# Patient Record
Sex: Male | Born: 1940 | Race: White | Hispanic: No | Marital: Married | Smoking: Never smoker
Health system: Southern US, Community
[De-identification: ages and names within clinical notes are randomized; demographics above are authoritative.]

## PROBLEM LIST (undated history)

## (undated) DIAGNOSIS — E119 Type 2 diabetes mellitus without complications: Secondary | ICD-10-CM

## (undated) DIAGNOSIS — I10 Essential (primary) hypertension: Secondary | ICD-10-CM

## (undated) DIAGNOSIS — I272 Pulmonary hypertension, unspecified: Secondary | ICD-10-CM

## (undated) DIAGNOSIS — I779 Disorder of arteries and arterioles, unspecified: Secondary | ICD-10-CM

## (undated) DIAGNOSIS — E785 Hyperlipidemia, unspecified: Secondary | ICD-10-CM

## (undated) DIAGNOSIS — I5030 Unspecified diastolic (congestive) heart failure: Secondary | ICD-10-CM

## (undated) DIAGNOSIS — R001 Bradycardia, unspecified: Secondary | ICD-10-CM

## (undated) DIAGNOSIS — I4891 Unspecified atrial fibrillation: Secondary | ICD-10-CM

## (undated) DIAGNOSIS — M1990 Unspecified osteoarthritis, unspecified site: Secondary | ICD-10-CM

## (undated) DIAGNOSIS — I499 Cardiac arrhythmia, unspecified: Secondary | ICD-10-CM

## (undated) HISTORY — DX: Type 2 diabetes mellitus without complications: E11.9

## (undated) HISTORY — DX: Essential (primary) hypertension: I10

## (undated) HISTORY — DX: Hyperlipidemia, unspecified: E78.5

## (undated) HISTORY — PX: TONSILLECTOMY: SUR1361

## (undated) HISTORY — DX: Disorder of arteries and arterioles, unspecified: I77.9

## (undated) HISTORY — PX: OTHER SURGICAL HISTORY: SHX169

## (undated) HISTORY — DX: Unspecified atrial fibrillation: I48.91

## (undated) HISTORY — DX: Pulmonary hypertension, unspecified: I27.20

## (undated) HISTORY — PX: COLONOSCOPY: SHX174

## (undated) HISTORY — PX: HERNIA REPAIR: SHX51

---

## 2004-04-10 ENCOUNTER — Ambulatory Visit (HOSPITAL_COMMUNITY): Admission: RE | Admit: 2004-04-10 | Discharge: 2004-04-10 | Payer: Self-pay | Admitting: Ophthalmology

## 2004-09-12 ENCOUNTER — Ambulatory Visit: Payer: Self-pay | Admitting: Cardiology

## 2004-10-10 ENCOUNTER — Ambulatory Visit: Payer: Self-pay | Admitting: Cardiology

## 2004-11-10 ENCOUNTER — Ambulatory Visit: Payer: Self-pay | Admitting: Cardiology

## 2004-11-17 ENCOUNTER — Ambulatory Visit: Payer: Self-pay | Admitting: Cardiology

## 2004-12-14 ENCOUNTER — Ambulatory Visit: Payer: Self-pay

## 2005-01-15 ENCOUNTER — Ambulatory Visit: Payer: Self-pay | Admitting: Cardiology

## 2005-01-23 ENCOUNTER — Ambulatory Visit: Payer: Self-pay | Admitting: Internal Medicine

## 2005-02-20 ENCOUNTER — Ambulatory Visit: Payer: Self-pay | Admitting: Cardiology

## 2005-03-20 ENCOUNTER — Ambulatory Visit: Payer: Self-pay | Admitting: Cardiology

## 2005-03-26 ENCOUNTER — Ambulatory Visit: Payer: Self-pay | Admitting: Cardiology

## 2005-04-09 ENCOUNTER — Ambulatory Visit: Payer: Self-pay | Admitting: Cardiology

## 2005-05-07 ENCOUNTER — Ambulatory Visit: Payer: Self-pay | Admitting: Cardiology

## 2005-06-04 ENCOUNTER — Ambulatory Visit: Payer: Self-pay | Admitting: Cardiology

## 2005-06-11 ENCOUNTER — Ambulatory Visit: Payer: Self-pay | Admitting: Cardiology

## 2005-07-09 ENCOUNTER — Ambulatory Visit: Payer: Self-pay | Admitting: Cardiology

## 2005-07-17 ENCOUNTER — Ambulatory Visit: Payer: Self-pay | Admitting: Cardiology

## 2005-07-24 ENCOUNTER — Ambulatory Visit: Payer: Self-pay | Admitting: Cardiology

## 2005-09-10 ENCOUNTER — Ambulatory Visit: Payer: Self-pay | Admitting: Cardiology

## 2005-10-10 ENCOUNTER — Ambulatory Visit: Payer: Self-pay | Admitting: Cardiology

## 2005-11-07 ENCOUNTER — Ambulatory Visit: Payer: Self-pay | Admitting: Cardiology

## 2005-12-05 ENCOUNTER — Ambulatory Visit: Payer: Self-pay | Admitting: Cardiology

## 2006-03-11 ENCOUNTER — Ambulatory Visit: Payer: Self-pay | Admitting: Cardiology

## 2007-01-30 ENCOUNTER — Ambulatory Visit: Payer: Self-pay | Admitting: Cardiology

## 2008-03-04 ENCOUNTER — Ambulatory Visit: Payer: Self-pay | Admitting: Cardiology

## 2008-03-08 ENCOUNTER — Encounter: Payer: Self-pay | Admitting: Cardiology

## 2008-09-03 ENCOUNTER — Encounter: Payer: Self-pay | Admitting: Cardiology

## 2008-11-25 ENCOUNTER — Encounter: Payer: Self-pay | Admitting: Cardiology

## 2009-01-19 ENCOUNTER — Encounter: Payer: Self-pay | Admitting: Cardiology

## 2009-03-02 ENCOUNTER — Encounter: Payer: Self-pay | Admitting: Cardiology

## 2009-04-11 ENCOUNTER — Ambulatory Visit: Payer: Self-pay | Admitting: Cardiology

## 2009-06-14 ENCOUNTER — Encounter: Payer: Self-pay | Admitting: Cardiology

## 2009-07-30 DIAGNOSIS — I4891 Unspecified atrial fibrillation: Secondary | ICD-10-CM | POA: Insufficient documentation

## 2009-11-22 ENCOUNTER — Encounter: Payer: Self-pay | Admitting: Cardiology

## 2010-04-06 ENCOUNTER — Ambulatory Visit: Payer: Self-pay | Admitting: Cardiology

## 2010-04-06 DIAGNOSIS — E119 Type 2 diabetes mellitus without complications: Secondary | ICD-10-CM

## 2010-04-06 DIAGNOSIS — E782 Mixed hyperlipidemia: Secondary | ICD-10-CM | POA: Insufficient documentation

## 2010-04-06 DIAGNOSIS — I10 Essential (primary) hypertension: Secondary | ICD-10-CM

## 2010-04-18 ENCOUNTER — Encounter: Payer: Self-pay | Admitting: Cardiology

## 2010-04-19 ENCOUNTER — Telehealth (INDEPENDENT_AMBULATORY_CARE_PROVIDER_SITE_OTHER): Payer: Self-pay | Admitting: *Deleted

## 2010-04-20 ENCOUNTER — Encounter: Payer: Self-pay | Admitting: Cardiology

## 2010-04-21 ENCOUNTER — Ambulatory Visit: Payer: Self-pay | Admitting: Cardiology

## 2010-04-27 ENCOUNTER — Encounter: Payer: Self-pay | Admitting: Cardiology

## 2010-05-09 ENCOUNTER — Encounter: Payer: Self-pay | Admitting: Cardiology

## 2010-05-11 ENCOUNTER — Ambulatory Visit (HOSPITAL_COMMUNITY): Admission: RE | Admit: 2010-05-11 | Discharge: 2010-05-11 | Payer: Self-pay | Admitting: Optometry

## 2010-11-21 NOTE — Assessment & Plan Note (Signed)
Summary: 1 year f/u LA  Medications Added METFORMIN HCL 500 MG TABS (METFORMIN HCL) take 1&1/2 by mouth inmorning and 1 in the evening NAPROSYN 500 MG TABS (NAPROXEN) Take 2 tablet by mouth once a day      Allergies Added: NKDA  Visit Type:  Follow-up Primary Provider:  Dr. Kirstie Peri   History of Present Illness: 70 year old male presents for followup. He is here with his wife. He denies any sense of palpitations or unusual breathlessness. He is not having any exertional chest pain.  He states that work has been very slow. He does have 2 young grandchildren, and has been trying to spend some time with them lately.  Labs from March 2010 showed SGOT 22, SGPT 24, LDL 72, cholesterol 122, HDL 41, triglycerides 44.  He states he recently had an insurance physical with full lab work and an ECG approximately 2 months ago. He also reports an echocardiogram with Dr. [name]. Will request these records for review.  He is due for followup chest x-ray and pulmonary function tests.  He is not having any bleeding problems on Coumadin.   Preventive Screening-Counseling & Management  Alcohol-Tobacco     Smoking Status: never  Current Medications (verified): 1)  Lipitor 40 Mg Tabs (Atorvastatin Calcium) .... Take 1 Tablet By Mouth Once A Day 2)  Amiodarone Hcl 200 Mg Tabs (Amiodarone Hcl) .... Take 1/2 Tablet By Mouth Once A Day 3)  Coumadin 5 Mg Tabs (Warfarin Sodium) .... Take 1 Tablet By Mouth Once A Day 4)  Aspir-Low 81 Mg Tbec (Aspirin) .... Take 1 Tablet By Mouth Once A Day 5)  Multivitamins  Tabs (Multiple Vitamin) .... Take 1 Tablet By Mouth Once A Day 6)  Vitamin C 500 Mg Tabs (Ascorbic Acid) .... Take 1 Tablet By Mouth Once A Day 7)  Niacin 250 Mg Tabs (Niacin) .... Take 1 Tablet By Mouth Once A Day 8)  Glipizide 10 Mg Tabs (Glipizide) .... Take 1 Tablet By Mouth Two Times A Day 9)  Chlorthalidone 25 Mg Tabs (Chlorthalidone) .... Take 1 Tablet By Mouth Once A Day 10)  Actos 45 Mg  Tabs (Pioglitazone Hcl) .... Take 1 Tablet By Mouth Once A Day 11)  Metformin Hcl 500 Mg Tabs (Metformin Hcl) .... Take 1&1/2 By Mouth Inmorning and 1 in The Evening 12)  Fish Oil 1000 Mg Caps (Omega-3 Fatty Acids) .... Take 1 Tablet By Mouth Two Times A Day 13)  Exforge 5-320 Mg Tabs (Amlodipine Besylate-Valsartan) .... Take 1 Tablet By Mouth Once A Day 14)  Naprosyn 500 Mg Tabs (Naproxen) .... Take 2 Tablet By Mouth Once A Day  Allergies (verified): No Known Drug Allergies  Comments:  Nurse/Medical Assistant: The patient's medications and allergies were verbally reviewed with the patient and were updated in the Medication and Allergy Lists.  Past History:  Social History: Last updated: 04/06/2010 Full Time Married  Tobacco Use - No  Past Medical History: Atrial Fibrillation - paroxysmal Diabetes Type 2 Hyperlipidemia Hypertension  Social History: Full Time Married  Tobacco Use - No  Review of Systems       The patient complains of peripheral edema.  The patient denies anorexia, fever, weight loss, chest pain, syncope, dyspnea on exertion, prolonged cough, headaches, melena, hematochezia, and severe indigestion/heartburn.         Arthritic knee pain, otherwise reviewed and negative except as outlined.  Vital Signs:  Patient profile:   70 year old male Height:  67 inches Weight:      264 pounds BMI:     41.50 Pulse rate:   65 / minute BP sitting:   100 / 63  (left arm) Cuff size:   large  Vitals Entered By: Carlye Grippe (April 06, 2010 12:58 PM)  Nutrition Counseling: Patient's BMI is greater than 25 and therefore counseled on weight management options.  Physical Exam  Additional Exam:  Morbidly obese male in no acute distress. HEENT: Conjunctiva and lids normal, oropharynx with moist mucosa. Neck: Increased girth, supple, no JVD or bruits. Lungs: Clear to auscultation, diminished, nonlabored. Cardiac: Somewhat distant, irregular heart sounds, no S3.  Indistinct PMI. Abdomen: Morbidly obese, cannot palpate liver edge, bowel sounds present, no tenderness. Skin: Warm and dry. Extremities: Trace edema below the knees, left greater than right, distal pulses one plus. Musculoskeletal: No gross deformities. Neuropsychiatric: Alert and oriented x3, affect appropriate.   PFT  Procedure date:  11/25/2008  Findings:      FVC 2.3 (59%), FEV1 1.9 (71%), FEV1/FVC 83, DLCO 12.6 (50%).  CXR  Procedure date:  03/08/2008  Findings:      Stable moderate enlargement of cardiac silhouette, no infiltrates, stable noncalcific pleural thickening lateral chest wall.  Carotid Doppler  Procedure date:  10/18/2005  Findings:      No hemodynamically significant stenosis.  EKG  Procedure date:  04/06/2010  Findings:      Atrial fibrillation at 62 beats per minute with incomplete right bundle branch block pattern and nonspecific ST-T changes.  Impression & Recommendations:  Problem # 1:  ATRIAL FIBRILLATION (ICD-427.31)  Paroxysmal to persistent atrial fibrillation. Patient is in atrial fibrillation today based on electrocardiogram, on stable medical regimen. He is not aware of any palpitations or unusual breathlessness however. Duration is uncertain. We discussed this, and at this point I will continue his present regimen including amiodarone, with plan for followup chest x-ray and pulmonary function tests. He will return for an electrocardiogram  in 2 weeks, and I will also try to obtain his most recent tracing from Dr. [name] with associated blood work. If it is more clear that he is in persistent atrial fibrillation, I will likely discontinue his amiodarone, perhaps adding a low-dose beta blocker for rate control, and continue a strategy of rate control and anticoagulation thereafter. Followup in 6 months.  His updated medication list for this problem includes:    Amiodarone Hcl 200 Mg Tabs (Amiodarone hcl) .Marland Kitchen... Take 1/2 tablet by mouth once a  day    Coumadin 5 Mg Tabs (Warfarin sodium) .Marland Kitchen... Take 1 tablet by mouth once a day    Aspir-low 81 Mg Tbec (Aspirin) .Marland Kitchen... Take 1 tablet by mouth once a day  Orders: T-Chest x-ray, 2 views (16109) Pulmonary Function Test (PFT)  Problem # 2:  ESSENTIAL HYPERTENSION, BENIGN (ICD-401.1)  Blood pressure well controlled today.  His updated medication list for this problem includes:    Aspir-low 81 Mg Tbec (Aspirin) .Marland Kitchen... Take 1 tablet by mouth once a day    Chlorthalidone 25 Mg Tabs (Chlorthalidone) .Marland Kitchen... Take 1 tablet by mouth once a day    Exforge 5-320 Mg Tabs (Amlodipine besylate-valsartan) .Marland Kitchen... Take 1 tablet by mouth once a day  Problem # 3:  MIXED HYPERLIPIDEMIA (ICD-272.2)  Will obtain recent labs from Dr. [name] for review.  His updated medication list for this problem includes:    Lipitor 40 Mg Tabs (Atorvastatin calcium) .Marland Kitchen... Take 1 tablet by mouth once a day    Niacin 250  Mg Tabs (Niacin) .Marland Kitchen... Take 1 tablet by mouth once a day  Problem # 4:  DIABETES MELLITUS, TYPE II (ICD-250.00)  Followed by Dr. [name].  His updated medication list for this problem includes:    Aspir-low 81 Mg Tbec (Aspirin) .Marland Kitchen... Take 1 tablet by mouth once a day    Glipizide 10 Mg Tabs (Glipizide) .Marland Kitchen... Take 1 tablet by mouth two times a day    Actos 45 Mg Tabs (Pioglitazone hcl) .Marland Kitchen... Take 1 tablet by mouth once a day    Metformin Hcl 500 Mg Tabs (Metformin hcl) .Marland Kitchen... Take 1&1/2 by mouth inmorning and 1 in the evening    Exforge 5-320 Mg Tabs (Amlodipine besylate-valsartan) .Marland Kitchen... Take 1 tablet by mouth once a day  Patient Instructions: 1)  Your physician wants you to follow-up in: 6 months. You will receive a reminder letter in the mail one-two months in advance. If you don't receive a letter, please call our office to schedule the follow-up appointment. 2)  Follow up EKG on Friday, April 21, 2010 at 8:30am. 3)  A chest x-ray takes a picture of the organs and structures inside the chest, including the  heart, lungs, and blood vessels. This test can show several things, including, whether the heart is enlarged; whether fluid is building up in the lungs; and whether pacemaker / defibrillator leads are still in place. 4)  Your physician has recommended that you have a pulmonary function test.  Pulmonary Function Tests are a group of tests that measure how well air moves in and out of your lungs.

## 2010-11-21 NOTE — Progress Notes (Signed)
Summary: Pending CXR   Phone Note Call from Patient Call back at Home Phone [phone]   Summary of Call: Left message to call back on machine regarding CXR that was ordered but has not been done yet. Initial call taken by: Cyril Loosen, RN, BSN,  April 19, 2010 8:54 AM  Follow-up for Phone Call        Pt's wife notified to have pt go to [facility] to have CXR done. Follow-up by: Cyril Loosen, RN, BSN,  April 19, 2010 9:20 AM

## 2010-11-21 NOTE — Assessment & Plan Note (Signed)
Summary: 2 WK F/U EKG-JM  Nurse Visit   Vital Signs:  Patient profile:   70 year old male Height:      67 inches Weight:      264 pounds  Vitals Entered By: Carlye Grippe (April 21, 2010 8:18 AM) CC: ekg   Visit Type:  nurse visit  CC:  [id number].   Preventive Screening-Counseling & Management  Alcohol-Tobacco     Smoking Status: never  Allergies: No Known Drug Allergies  Orders Added: 1)  EKG w/ Interpretation [93000]

## 2010-11-21 NOTE — Letter (Signed)
Summary: External Correspondence/ [facility]  External Correspondence/ [facility]   Imported By: Dorise Hiss 05/17/2010 14:07:30  _____________________________________________________________________  External Attachment:    Type:   Image     Comment:   External Document

## 2010-12-27 ENCOUNTER — Ambulatory Visit: Payer: Self-pay | Admitting: Cardiology

## 2011-01-03 ENCOUNTER — Encounter: Payer: Self-pay | Admitting: *Deleted

## 2011-01-30 ENCOUNTER — Encounter: Payer: Self-pay | Admitting: Cardiology

## 2011-01-30 ENCOUNTER — Ambulatory Visit (INDEPENDENT_AMBULATORY_CARE_PROVIDER_SITE_OTHER): Payer: Medicare PPO | Admitting: Cardiology

## 2011-01-30 VITALS — BP 99/64 | HR 48 | Ht 67.0 in | Wt 264.0 lb

## 2011-01-30 DIAGNOSIS — R0609 Other forms of dyspnea: Secondary | ICD-10-CM

## 2011-01-30 DIAGNOSIS — E119 Type 2 diabetes mellitus without complications: Secondary | ICD-10-CM

## 2011-01-30 DIAGNOSIS — I4891 Unspecified atrial fibrillation: Secondary | ICD-10-CM

## 2011-01-30 DIAGNOSIS — I10 Essential (primary) hypertension: Secondary | ICD-10-CM

## 2011-01-30 NOTE — Assessment & Plan Note (Signed)
Persistent. Amiodarone has already been discontinued. Plan at this point will be a strategy of heart rate control and anticoagulation. Continue routine followup.

## 2011-01-30 NOTE — Assessment & Plan Note (Signed)
Continue followup with Dr. Shah. 

## 2011-01-30 NOTE — Assessment & Plan Note (Signed)
Chronic, not progressive. Suspect this is multifactorial complicated by morbid obesity with no regular exercise, diabetes mellitus, and atrial fibrillation. I discussed weight loss, also a basic walking regimen. He will let us know of symptoms progress, and we could certainly consider followup ischemic testing.

## 2011-01-30 NOTE — Progress Notes (Signed)
Clinical Summary Robert Baldwin is a 70 y.o.male presenting for followup. He was last seen in June 2011. He had been on amiodarone with history of paroxysmal atrial fibrillation, although this was discontinued after our last visit given persistent dysrhythmia. He has tolerated atrial fibrillation fairly well with rate control, and continues on Coumadin.  Followup testing is outlined below, including a CT scan of the chest prompting referral to Dr. Orson Aloe for pulmonary evaluation. Ultimately a PET scan was obtained demonstrating no clear evidence of malignancy with probable mucoid impaction identified. Patient states he underwent a bronchoscopy with removal of the "plug."  He reports fatigue, stress with work, dyspnea on exertion although not progressive. Remains morbidly obese. We did discuss diet and weight loss again.   No Known Allergies  Current outpatient prescriptions:amLODipine-valsartan (EXFORGE) 5-320 MG per tablet, Take 1 tablet by mouth daily.  , Disp: , Rfl: ;  Ascorbic Acid (VITAMIN C) 500 MG tablet, Take 500 mg by mouth daily.  , Disp: , Rfl: ;  aspirin 81 MG tablet, Take 81 mg by mouth daily.  , Disp: , Rfl: ;  atorvastatin (LIPITOR) 40 MG tablet, Take 40 mg by mouth daily.  , Disp: , Rfl:  chlorthalidone (HYGROTON) 25 MG tablet, Take 25 mg by mouth daily.  , Disp: , Rfl: ;  fish oil-omega-3 fatty acids 1000 MG capsule, Take 2 g by mouth 2 (two) times daily.  , Disp: , Rfl: ;  glipiZIDE (GLUCOTROL) 10 MG tablet, Take 10 mg by mouth 2 (two) times daily before a meal.  , Disp: , Rfl: ;  glucosamine-chondroitin 500-400 MG tablet, Take 1 tablet by mouth daily.  , Disp: , Rfl:  metFORMIN (GLUCOPHAGE) 500 MG tablet, Take 500 mg by mouth 2 (two) times daily with a meal. Take 1 and 1/2 tabs am 1 tab pm , Disp: , Rfl: ;  metoprolol succinate (TOPROL-XL) 25 MG 24 hr tablet, Take 25 mg by mouth daily.  , Disp: , Rfl: ;  Multiple Vitamins-Minerals (MULTIVITAMIN WITH MINERALS) tablet, Take 1 tablet by  mouth daily.  , Disp: , Rfl: ;  niacin 250 MG tablet, Take 250 mg by mouth daily with breakfast.  , Disp: , Rfl:  pioglitazone (ACTOS) 45 MG tablet, Take 45 mg by mouth daily.  , Disp: , Rfl: ;  warfarin (COUMADIN) 5 MG tablet, Take 5 mg by mouth daily.  , Disp: , Rfl: ;  DISCONTD: naproxen (NAPROSYN) 500 MG tablet, Take 500 mg by mouth 2 (two) times daily with a meal.  , Disp: , Rfl:   Past Medical History  Diagnosis Date  . Atrial fibrillation     Paroxysmal  . Type 2 diabetes mellitus   . Hyperlipidemia   . Hypertension     Social History Mr. Poehler reports that he has never smoked. He has never used smokeless tobacco. Mr. Lord reports that he does not drink alcohol.  Review of Systems As outlined above, otherwise reviewed and negative.  Physical Examination Filed Vitals:   01/30/11 1031  BP: 99/64  Pulse: 48  Morbidly obese male in no acute distress. HEENT: Conjunctiva and lids normal, oropharynx with moist mucosa. Neck: Increased girth, supple, no JVD or bruits. Lungs: Clear to auscultation, diminished, nonlabored. Cardiac: Somewhat distant, irregular heart sounds, no S3. Indistinct PMI. Abdomen: Morbidly obese, cannot palpate liver edge, bowel sounds present, no tenderness. Skin: Warm and dry. Extremities: Trace edema below the knees, left greater than right, distal pulses one plus. Musculoskeletal: No gross deformities.  Neuropsychiatric: Alert and oriented x3, affect appropriate.   ECG Atrial fibrillation at 53 beats per minute, low voltage, poor anterior R wave progression.  Studies Pulmonary function tests from 04/18/2010 revealed FEV1 2.8, 73% predicted; FVC 3.5, 76% predicted; FEV1/FVC 76% predicted; DLCO 59% predicted.  Chest x-ray from 04/20/2010 revealed stable cardiac enlargement, with indeterminate nodule in the left lung field measuring 10 mm.  Chest CT from 04/27/2010 revealed possible mucoid impaction involving the superior segment of the left lower lobe  corresponding to abnormality on chest x-ray.  Problem List and Plan

## 2011-01-30 NOTE — Assessment & Plan Note (Signed)
Blood pressure very well controlled. 

## 2011-01-30 NOTE — Patient Instructions (Signed)
Your physician wants you to follow-up in: 6 months. You will receive a reminder letter in the mail one-two months in advance. If you don't receive a letter, please call our office to schedule the follow-up appointment. Your physician recommends that you continue on your current medications as directed. Please refer to the Current Medication list given to you today. 

## 2011-02-02 ENCOUNTER — Encounter: Payer: Self-pay | Admitting: Cardiology

## 2011-03-06 NOTE — Assessment & Plan Note (Signed)
[facility]                          [facility] OFFICE NOTE   Robert Baldwin, Robert Baldwin                          MRN:          [id number]  DATE:03/04/2008                            DOB:          Apr 26, 1941    PRIMARY CARE PHYSICIAN:  Kirstie Peri, MD   REASON FOR VISIT:  Follow up atrial fibrillation.   HISTORY OF PRESENT ILLNESS:  Robert Baldwin comes in for an annual visit.  He  has a history of paroxysmal atrial fibrillation that is been very well-  controlled on amiodarone and Coumadin.  He has in the past had his  surveillance labs done by Dr. Eliberto Ivory although has not had any recent  liver function tests or thyroid function studies based on his report.  His last chest x-ray was in April 2008, revealing no acute  cardiopulmonary findings.  He had pulmonary function tests that  demonstrated a reduction in diffusion capacity and based on this we  decreased his amiodarone to 100 mg daily.  His Coumadin has been  maintained through [facility].  He denies having any  significant problems with chest pain, progressive breathlessness, major  bleeding problems, vision changes or significant palpitations.  His  electrocardiogram today shows sinus rhythm with a prolonged PR interval  of 270 msec, which is stable, and a right bundle branch block pattern,  which has been seen in the past although not on the most recent tracing.  He had follow-up pulmonary function studies done in November 2008, which  revealed moderate reduction in the DLCO (described as moderate to severe  decrease previously).   ALLERGIES:  No known drug allergies.   MEDICATIONS:  1. Amiodarone 100 mg p.o. daily  2. Coumadin as directed by Dr. [name] to achieve a therapeutic INR of      2.0-3.0.  3. Diovan 320 mg p.o. daily.  4. Aspirin 81 mg p.o. daily.  5. Multivitamin one p.o. daily.  6. Vitamin C 500 mg p.o. daily.  7. Niacin 200 mg p.o. daily.  8. Glipizide 10 mg p.o. b.i.d.  9. Chlorthalidone 25 mg p.o. daily.  10.Lipitor 10 mg p.o. daily.  11.Actos 45 mg p.o. daily.  12.Metformin 1500 mg p.o. q.a.m. and 1000 mg p.o. q.p.m.   REVIEW OF SYSTEMS:  As outlined above.  He states he had a recent tick  bite and took a course of doxycycline.  Otherwise he has had no major  problems.   EXAMINATION:  Oxygen saturation 96% on room air.  Blood pressure 108/64,  heart rate 73 and regular rate.  Weight is 253 pounds.  This is an overweight male in no acute distress.  HEENT:  Conjunctiva and lids normal.  Pharynx clear.  NECK:  Supple.  No elevated jugular venous pressure, no loud bruits.  LUNGS:  Clear with diminished breath sounds throughout.  CARDIAC:  A regular rate and rhythm.  No loud murmur or S3 gallop.  ABDOMEN:  Obese, normoactive bowel sounds.  No tenderness to palpation.  EXTREMITIES:  No frank pitting edema.  There is some venous stasis.  Distal pulses  are 2+.  SKIN:  Warm and dry.  MUSCULOSKELETAL:  No kyphosis noted.  NEUROPSYCHIATRIC: The patient is alert and oriented x3.  Affect is  appropriate.   IMPRESSION AND RECOMMENDATIONS:  History of atrial fibrillation, well-  controlled on low-dose amiodarone and Coumadin.  We will plan a follow-  up chest x-ray, pulmonary function tests, liver function tests and  thyroid function studies.  He will continue see Dr. [name] for follow-up  of Coumadin and we will see him on an annual basis as before unless he  develops any intercurrent problems.     Robert Sidle, MD  Electronically Signed    SGM/MedQ  DD: 03/04/2008  DT: 03/04/2008  Job #: [id number]   cc:   Kirstie Peri, MD

## 2011-03-06 NOTE — Assessment & Plan Note (Signed)
[facility]                          [facility] OFFICE NOTE   [name], Baldwin                          MRN:          [id number]  DATE:04/11/2009                            DOB:          1941/04/08    PRIMARY CARE PHYSICIAN:  Kirstie Peri, MD   REASON FOR VISIT:  Followup atrial fibrillation.   HISTORY OF PRESENT ILLNESS:  [name] comes in for an annual visit.  Overall, he is symptomatically stable without any progressive  breathlessness or palpitations.  His electrocardiogram today shows sinus  rhythm at 68 beats per minute with a prolonged period interval of 246  msec and chronic right bundle-branch block pattern.  Pulmonary function  tests done in February showed no obstructive defect with a stable  moderate restrictive lung defect and moderately decreased diffusing  capacity which is actually improved compared to his previous studies.  Recent lipid profile shows good LDL control of 65 on Lipitor with normal  AST and ALT.  He has had some arthritic right knee and hip pain  following a fall and this has limited his ambulation somewhat.  He is up  4 pounds from his last visit.  We talked about weight loss and mild-to-  moderate exercise today.   ALLERGIES:  No known drug allergies.   PRESENT MEDICATIONS:  1. Amiodarone 100 mg p.o. daily.  2. Coumadin as directed by Dr. [name] to achieve a therapeutic of INR of      2.0-3.0  3. Aspirin 81 mg p.o. daily.  4. Multivitamin 1 p.o. daily.  5. Vitamin C 500 mg p.o. daily.  6. Niacin 250 mg p.o. daily.  7. Glipizide 10 mg p.o. b.i.d.  8. Chlorthalidone 25 mg p.o. daily.  9. Actos 45 mg p.o. daily.  10.Metformin 1500 mg p.o. q.a.m. and 1000 mg p.o. q.p.m.  11.Lipitor 40 mg p.o. daily.  12.Omega-3 supplements 1000 mg p.o. b.i.d.  13.Exforge 5/320 mg p.o. daily.   REVIEW OF SYSTEMS:  As outlined above.  Otherwise, negative.   PHYSICAL EXAMINATION:  VITAL SIGNS:  Blood pressure today 106/67,  heart  rate 75 and regular, weight 257 pounds.  GENERAL:  This is a morbidly obese male in no acute distress.  NECK:  No elevated jugular venous pressure.  No significant carotid  bruits.  LUNGS:  Clear without labored breathing.  CARDIAC:  Regular rate and rhythm without S3 gallop.  EXTREMITIES:  Exhibit trace edema and venous stasis.  Distal pulses are  2+.   IMPRESSION AND RECOMMENDATIONS:  History of paroxysmal atrial  fibrillation, well controlled on low-dose amiodarone and Coumadin.  Chest x-ray at last visit was stable and pulmonary function tests from  earlier in the year are also relatively stable with improved diffusion  capacity in general.  We will plan to continue his present regimen and I  will see him back for annual visit preceded by pulmonary function test,  a TSH, and liver function tests.  His electrocardiogram is stable today.     Robert Sidle, MD  Electronically Signed    SGM/MedQ  DD: 04/11/2009  DT: 04/12/2009  Job #: [id number]   cc:   Kirstie Peri, MD

## 2011-03-09 NOTE — Assessment & Plan Note (Signed)
[facility]                          [facility] OFFICE NOTE   Robert Baldwin, Robert Baldwin                          MRN:          [id number]  DATE:01/30/2007                            DOB:          06/05/1941    FOLLOWUP VISIT   PRIMARY CARE PHYSICIAN:  Dr. Weyman Pedro.   REASON FOR VISIT:  Followup atrial fibrillation.   HISTORY OF PRESENT ILLNESS:  I last saw Robert Baldwin back in November of  2006.  He saw Roxanne Mins, PA-C, in the interim.  Symptomatically, he  is not reporting any problems with significant palpitations, chest pain,  or limiting dyspnea on exertion.  He has continued on amiodarone and  Coumadin, and his electrocardiogram today shows sinus rhythm, actually  with improvement in both his QT interval and PR interval compared to his  previous tracing.  He has had no problems with balance or vision.  He  states that Dr. Eliberto Ivory planned a full set of blood work later this  month, and I note that he is due for a followup chest x-ray as well as  pulmonary function test.   ALLERGIES:  No known drug allergies.   PRESENT MEDICATIONS:  1. Glucotrol XL 10 mg p.o. daily.  2. Chlorthalidone 25 mg p.o. daily.  3. Multivitamin daily.  4. Coumadin as directed by Dr. Eliberto Ivory.  5. Diovan 80 mg p.o. daily.  6. Amiodarone 200 mg p.o. daily.  7. Glucophage 1000 mg p.o. q. a.m. and 1500 mg p.o. q. p.m.   REVIEW OF SYSTEMS:  As described in the history of present illness.  Otherwise, negative.   EXAMINATION:  Blood pressure is 143/78, heart rate is 75, weight is 256  pounds.  He is an overweight male in no acute distress.  Examination of the neck reveals no elevated jugular venous pressure or  loud bruits.  No thyromegaly or thyroid tenderness is noted.  LUNGS:  Generally clear.  Somewhat diminished breath sounds.  No  wheezing or labored breathing.  CARDIAC:  Regular rate and rhythm.  No S3 gallop or pericardial rub.  ABDOMEN:  Obese, nontender.  EXTREMITIES:  No significant pitting edema.  He has healing ecchymosis  on his right leg following an injury.   IMPRESSION/RECOMMENDATIONS:  1. Paroxysmal atrial fibrillation, well controlled on amiodarone and      Coumadin.  Electrocardiogram today shows improved QT and PR      intervals compared with the previous tracing.  He is due for      followup blood work with Dr. Eliberto Ivory, which from the perspective of      amiodarone, should include thyroid and liver function studies.  We      will order a chest x-ray and pulmonary function tests as well for      surveillance.  At this point, I do not anticipate any medication      adjustments, and we will plan to see him back next year assuming      the situation remains stable.  2. Robert Baldwin asked about carotid artery disease today.  He had a  carotid duplex scan in December of 2006, which revealed no      hemodynamically significant stenoses.  I would anticipate a      followup study perhaps in a year's time.  3. Otherwise, continued followup of hypertension, hyperlipidemia, and      Type 2 diabetes mellitus with Dr. Eliberto Ivory.     Jonelle Sidle, MD  Electronically Signed    SGM/MedQ  DD: 01/30/2007  DT: 01/30/2007  Job #: [id number]   cc:   Weyman Pedro

## 2011-03-12 ENCOUNTER — Other Ambulatory Visit: Payer: Self-pay | Admitting: Cardiology

## 2011-03-12 NOTE — Telephone Encounter (Signed)
Robert Baldwin

## 2011-03-13 ENCOUNTER — Other Ambulatory Visit: Payer: Self-pay | Admitting: *Deleted

## 2011-03-13 MED ORDER — METOPROLOL TARTRATE 25 MG PO TABS: 25.0000 mg | ORAL_TABLET | Freq: Every day | ORAL | Status: DC

## 2011-03-14 NOTE — Telephone Encounter (Signed)
rx responded to already

## 2011-09-03 ENCOUNTER — Encounter: Payer: Self-pay | Admitting: Cardiology

## 2011-09-05 ENCOUNTER — Telehealth: Payer: Self-pay | Admitting: *Deleted

## 2011-09-05 ENCOUNTER — Ambulatory Visit (INDEPENDENT_AMBULATORY_CARE_PROVIDER_SITE_OTHER): Payer: Medicare PPO | Admitting: Cardiology

## 2011-09-05 ENCOUNTER — Encounter: Payer: Self-pay | Admitting: Cardiology

## 2011-09-05 VITALS — BP 114/68 | HR 56 | Ht 66.0 in | Wt 266.0 lb

## 2011-09-05 DIAGNOSIS — I10 Essential (primary) hypertension: Secondary | ICD-10-CM

## 2011-09-05 DIAGNOSIS — R0989 Other specified symptoms and signs involving the circulatory and respiratory systems: Secondary | ICD-10-CM

## 2011-09-05 DIAGNOSIS — I4891 Unspecified atrial fibrillation: Secondary | ICD-10-CM

## 2011-09-05 NOTE — Assessment & Plan Note (Signed)
Continue strategy of heart rate control and anticoagulation. Coumadin is followed by Dr. [name]. Would like to discontinue Lopressor given his bradycardia.

## 2011-09-05 NOTE — Telephone Encounter (Signed)
Message copied by Arlyss Gandy on Wed Sep 05, 2011  4:10 PM ------      Message from: MCDOWELL, Illene Bolus      Created: Wed Sep 05, 2011  9:12 AM       Reviewed report. LVEF described as normal with mild LVH. Moderate left and right atrial enlargement, normal RV function. Indicates at least moderate pulmonary hypertension with RVSP of 65 mm mercury. This was not described on prior echocardiogram report in 2010. Previous PFTs suggest restrictive lung defect (likely related to weight), but no definite obstructive lung disease such as COPD or asthma. This could be related to diastolic dysfunction with history of hypertension, or perhaps obstructive sleep apnea. Would recommend a followup 2-D echocardiogram through our office for his next followup.

## 2011-09-05 NOTE — Patient Instructions (Signed)
Your physician wants you to follow-up in: 6 months. You will receive a reminder letter in the mail one-two months in advance. If you don't receive a letter, please call our office to schedule the follow-up appointment. Stop Lopressor (metoprolol)

## 2011-09-05 NOTE — Assessment & Plan Note (Signed)
Blood pressure very well controlled today. 

## 2011-09-05 NOTE — Telephone Encounter (Signed)
Pt's wife notified of results and verbalized understanding.

## 2011-09-05 NOTE — Assessment & Plan Note (Signed)
Chronic history, no significant change. Continue to recommend diet with weight loss, also exercise. Will request the recent echocardiogram results from Dr. [name].

## 2011-09-05 NOTE — Progress Notes (Signed)
Clinical Summary Robert Baldwin is a 70 y.o.male presenting for followup. He was seen in April.  He reports no palpitations, stable dyspnea on exertion, no cough or wheezing. Indicates that he had a recent echocardiogram done at [facility] internal medicine and was told that he had "high pressures" in his "lungs." I do not have the report for review.  He remains morbidly obese, has not been exercising regularly. We discussed diet and exercise today. Also note that his heart rate is fairly slow on low-dose Lopressor, which he is only taking once a day.  Other medications are stable. He denies any bleeding problems on Coumadin. This is followed by Dr. [name].  No Known Allergies  Medication list reviewed.  Past Medical History  Diagnosis Date  . Atrial fibrillation     Paroxysmal  . Type 2 diabetes mellitus   . Hyperlipidemia   . Hypertension     Past Surgical History  Procedure Date  . Hernia repair   . Cataract surgery    Social History Robert Baldwin reports that he has never smoked. He has never used smokeless tobacco. Robert Baldwin reports that he does not drink alcohol.  Review of Systems Complains of arthritic pain in his hips and knees. Otherwise reviewed and negative except as outlined.  Physical Examination Filed Vitals:   09/05/11 0821  BP: 114/68  Pulse: 56    Morbidly obese male in no acute distress.  HEENT: Conjunctiva and lids normal, oropharynx with moist mucosa.  Neck: Increased girth, supple, no JVD or bruits.  Lungs: Clear to auscultation, diminished, nonlabored.  Cardiac: Distant, irregular heart sounds, no S3. Indistinct PMI.  Abdomen: Morbidly obese, bowel sounds present, no tenderness.  Skin: Warm and dry.  Extremities: Trace edema below the knees, distal pulses one plus.  Musculoskeletal: No gross deformities.  Neuropsychiatric: Alert and oriented x3, affect appropriate.   ECG Reviewed in EMR.   Problem List and Plan

## 2012-02-27 ENCOUNTER — Other Ambulatory Visit: Payer: Self-pay | Admitting: *Deleted

## 2012-02-27 ENCOUNTER — Other Ambulatory Visit: Payer: Self-pay

## 2012-02-27 ENCOUNTER — Other Ambulatory Visit (INDEPENDENT_AMBULATORY_CARE_PROVIDER_SITE_OTHER): Payer: Medicare PPO

## 2012-02-27 DIAGNOSIS — I4891 Unspecified atrial fibrillation: Secondary | ICD-10-CM

## 2012-02-27 DIAGNOSIS — R0609 Other forms of dyspnea: Secondary | ICD-10-CM

## 2012-03-07 ENCOUNTER — Ambulatory Visit (INDEPENDENT_AMBULATORY_CARE_PROVIDER_SITE_OTHER): Payer: Medicare PPO | Admitting: Cardiology

## 2012-03-07 ENCOUNTER — Encounter: Payer: Self-pay | Admitting: Cardiology

## 2012-03-07 VITALS — BP 106/64 | HR 59 | Ht 66.0 in | Wt 273.0 lb

## 2012-03-07 DIAGNOSIS — I4891 Unspecified atrial fibrillation: Secondary | ICD-10-CM

## 2012-03-07 DIAGNOSIS — I2789 Other specified pulmonary heart diseases: Secondary | ICD-10-CM

## 2012-03-07 DIAGNOSIS — I6523 Occlusion and stenosis of bilateral carotid arteries: Secondary | ICD-10-CM | POA: Insufficient documentation

## 2012-03-07 DIAGNOSIS — IMO0002 Reserved for concepts with insufficient information to code with codable children: Secondary | ICD-10-CM

## 2012-03-07 DIAGNOSIS — R0989 Other specified symptoms and signs involving the circulatory and respiratory systems: Secondary | ICD-10-CM

## 2012-03-07 DIAGNOSIS — I10 Essential (primary) hypertension: Secondary | ICD-10-CM

## 2012-03-07 NOTE — Assessment & Plan Note (Signed)
Mild by most recent echocardiogram with mild RV dysfunction. Suspect that he has underlying obstructive sleep apnea, also restrictive lung disease with obesity. He has not been interested in pursuing a sleep study.

## 2012-03-07 NOTE — Patient Instructions (Signed)
Your physician recommends that you schedule a follow-up appointment in:6 months. You will receive a reminder letter in the mail in about 4 months reminding you to call and schedule your appointment. If you don't receive this letter, please contact our office.  Your physician has requested that you have a carotid duplex. This test is an ultrasound of the carotid arteries in your neck. It looks at blood flow through these arteries that supply the brain with blood. Allow one hour for this exam. There are no restrictions or special instructions.    Your physician recommends that you continue on your current medications as directed. Please refer to the Current Medication list given to you today.

## 2012-03-07 NOTE — Assessment & Plan Note (Signed)
Question soft right carotid bruit. Carotid Dopplers will be obtained.

## 2012-03-07 NOTE — Assessment & Plan Note (Signed)
Low-normal blood pressure today, asymptomatic. Continue observation.

## 2012-03-07 NOTE — Progress Notes (Signed)
Clinical Summary Robert Baldwin is a 71 y.o.male presenting for followup. He was seen in November 2012. He is here with his wife and grandchild. He reports no unusual dizziness, no palpitations or chest pain. Low-normal blood pressure is noted today. He has not required any medications for heart rate control, continues on Coumadin without bleeding problems.  Previous echo report reviewed from [facility] internal medicine. Report describes mild LVH with normal LVEF, moderate biatrial enlargement, normal RV function, RVSP 65 mmHg. Followup study done just recently shows LVEF of 60% with moderate LVH, mildly dilated right ventricle with mildly reduced function, only mildly increased pulmonary pressure. We discussed these results. I suspect he may well have obstructive sleep apnea. He has not wanted to pursue any formal testing for this however.   No Known Allergies  Current Outpatient Prescriptions  Medication Sig Dispense Refill  . amLODipine-valsartan (EXFORGE) 5-320 MG per tablet Take 1 tablet by mouth daily.        . Ascorbic Acid (VITAMIN C) 500 MG tablet Take 500 mg by mouth daily.        Marland Kitchen aspirin 81 MG tablet Take 81 mg by mouth daily.        Marland Kitchen atorvastatin (LIPITOR) 40 MG tablet Take 40 mg by mouth daily.        . chlorthalidone (HYGROTON) 25 MG tablet Take 25 mg by mouth daily.        . fish oil-omega-3 fatty acids 1000 MG capsule Take 2 g by mouth 2 (two) times daily.        Marland Kitchen glipiZIDE (GLUCOTROL) 10 MG tablet Take 10 mg by mouth 2 (two) times daily before a meal.        . glucosamine-chondroitin 500-400 MG tablet Take 1 tablet by mouth daily.        . metFORMIN (GLUCOPHAGE) 1000 MG tablet Take 1.5 tablets by mouth every morning. & 1 in evening      . Multiple Vitamins-Minerals (MULTIVITAMIN WITH MINERALS) tablet Take 1 tablet by mouth daily.        . niacin 250 MG tablet Take 250 mg by mouth daily with breakfast.        . pioglitazone (ACTOS) 45 MG tablet Take 1 tablet by mouth Daily.      Marland Kitchen  warfarin (COUMADIN) 5 MG tablet Take 5 mg by mouth daily. Managed by Dr. Margaretmary Eddy office        Social History Mr. Titsworth reports that he has never smoked. He has never used smokeless tobacco. Mr. Naugle reports that he does not drink alcohol.  Review of Systems No bleeding problems. Occasional arthritic pain. Stress from work. Stable appetite. No regular exercise. Otherwise negative except as outlined.  Physical Examination Filed Vitals:   03/07/12 1325  BP: 106/64  Pulse: 59   Morbidly obese male in no acute distress.  HEENT: Conjunctiva and lids normal, oropharynx with moist mucosa.  Neck: Increased girth, supple, no JVD, question soft right carotid bruit.  Lungs: Clear to auscultation, diminished, nonlabored.  Cardiac: Distant, irregular heart sounds, no S3. Indistinct PMI.  Abdomen: Morbidly obese, bowel sounds present, no tenderness.  Skin: Warm and dry.  Extremities: Trace edema below the knees, distal pulses one plus.  Musculoskeletal: No gross deformities.  Neuropsychiatric: Alert and oriented x3, affect appropriate.   ECG Reviewed in EMR.  Problem List and Plan   ATRIAL FIBRILLATION Continue strategy of heart rate control and anticoagulation. He is not on any specific medications to lower heart rate at  this point. No changes made today.  Carotid bruit Question soft right carotid bruit. Carotid Dopplers will be obtained.  ESSENTIAL HYPERTENSION, BENIGN Low-normal blood pressure today, asymptomatic. Continue observation.  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 Suspect that he has underlying obstructive sleep apnea, also restrictive lung disease with obesity. He has not been interested in pursuing a sleep study.     Jonelle Sidle, M.D., F.A.C.C.

## 2012-03-07 NOTE — Assessment & Plan Note (Signed)
Continue strategy of heart rate control and anticoagulation. He is not on any specific medications to lower heart rate at this point. No changes made today.

## 2012-04-03 ENCOUNTER — Encounter (INDEPENDENT_AMBULATORY_CARE_PROVIDER_SITE_OTHER): Payer: Medicare PPO

## 2012-04-03 DIAGNOSIS — I6529 Occlusion and stenosis of unspecified carotid artery: Secondary | ICD-10-CM

## 2012-04-03 DIAGNOSIS — R0989 Other specified symptoms and signs involving the circulatory and respiratory systems: Secondary | ICD-10-CM

## 2012-04-09 ENCOUNTER — Telehealth: Payer: Self-pay | Admitting: *Deleted

## 2012-04-09 NOTE — Telephone Encounter (Signed)
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 Continue medical therapy, followup study in one year.

## 2012-04-09 NOTE — Telephone Encounter (Signed)
Patient informed. 

## 2013-03-18 ENCOUNTER — Other Ambulatory Visit (HOSPITAL_COMMUNITY): Payer: Self-pay | Admitting: Psychiatry

## 2013-03-19 ENCOUNTER — Other Ambulatory Visit: Payer: Self-pay | Admitting: *Deleted

## 2013-03-19 DIAGNOSIS — R0989 Other specified symptoms and signs involving the circulatory and respiratory systems: Secondary | ICD-10-CM

## 2013-04-09 ENCOUNTER — Encounter (INDEPENDENT_AMBULATORY_CARE_PROVIDER_SITE_OTHER): Payer: Medicare PPO

## 2013-04-09 DIAGNOSIS — I6529 Occlusion and stenosis of unspecified carotid artery: Secondary | ICD-10-CM

## 2013-04-09 DIAGNOSIS — R0989 Other specified symptoms and signs involving the circulatory and respiratory systems: Secondary | ICD-10-CM

## 2013-04-14 ENCOUNTER — Telehealth: Payer: Self-pay | Admitting: *Deleted

## 2013-04-14 NOTE — Telephone Encounter (Signed)
Message copied by Eustace Moore on Tue Apr 14, 2013 11:55 AM ------      Message from: Jonelle Sidle      Created: Sun Apr 12, 2013  8:46 PM       Reviewed report.  Stable carotid atherosclerosis  Continue medical therapy. ------

## 2013-04-14 NOTE — Telephone Encounter (Signed)
Patient's wife informed

## 2013-04-22 ENCOUNTER — Encounter: Payer: Self-pay | Admitting: Cardiology

## 2013-04-22 ENCOUNTER — Ambulatory Visit (INDEPENDENT_AMBULATORY_CARE_PROVIDER_SITE_OTHER): Payer: Medicare PPO | Admitting: Cardiology

## 2013-04-22 VITALS — BP 150/65 | HR 49 | Ht 67.0 in | Wt 272.1 lb

## 2013-04-22 DIAGNOSIS — I6523 Occlusion and stenosis of bilateral carotid arteries: Secondary | ICD-10-CM

## 2013-04-22 DIAGNOSIS — I6529 Occlusion and stenosis of unspecified carotid artery: Secondary | ICD-10-CM

## 2013-04-22 DIAGNOSIS — I10 Essential (primary) hypertension: Secondary | ICD-10-CM

## 2013-04-22 DIAGNOSIS — I4891 Unspecified atrial fibrillation: Secondary | ICD-10-CM

## 2013-04-22 DIAGNOSIS — I658 Occlusion and stenosis of other precerebral arteries: Secondary | ICD-10-CM

## 2013-04-22 NOTE — Patient Instructions (Signed)
Continue all current medications. Your physician wants you to follow up in:  1 year.  You will receive a reminder letter in the mail one-two months in advance.  If you don't receive a letter, please call our office to schedule the follow up appointment   

## 2013-04-22 NOTE — Assessment & Plan Note (Signed)
Stable 40-59% bilateral ICA stenoses. Continues on aspirin and statin.

## 2013-04-22 NOTE — Progress Notes (Signed)
Clinical Summary Robert Baldwin is a 72 y.o.male last seen in May 2013. He continues to see Dr. [name] on a regular basis, reports no problems with palpitations, dizziness, or chest pain. Coumadin is followed by Dr. [name].  ECG today shows atrial fibrillation with nonspecific ST changes, poor R wave progression and low voltage. He is bradycardic, although not clearly symptomatic. He is not requiring any rate lowering medications, likely has some degree of underlying conduction system disease.  Recent carotid Dopplers 6/19 showed 40-59% bilateral ICA stenoses, overall stable. He is on aspirin and statin therapy.  Continues to work full time in [employer], states that business has been somewhat better.  No Known Allergies  Current Outpatient Prescriptions  Medication Sig Dispense Refill  . amLODipine-valsartan (EXFORGE) 5-320 MG per tablet Take 1 tablet by mouth daily.        . Ascorbic Acid (VITAMIN C) 500 MG tablet Take 500 mg by mouth daily.        Marland Kitchen aspirin 81 MG tablet Take 81 mg by mouth daily.        Marland Kitchen atorvastatin (LIPITOR) 40 MG tablet Take 40 mg by mouth daily.        . chlorthalidone (HYGROTON) 25 MG tablet Take 25 mg by mouth daily.        . fish oil-omega-3 fatty acids 1000 MG capsule Take 1 g by mouth 2 (two) times daily.       Marland Kitchen glipiZIDE (GLUCOTROL) 10 MG tablet Take 10 mg by mouth daily.       Marland Kitchen glucosamine-chondroitin 500-400 MG tablet Take 3 tablets by mouth daily. Take 2 tablets in AM and 1 table in PM      . metFORMIN (GLUCOPHAGE) 1000 MG tablet Take 1.5 tablets by mouth every morning. & 1 in evening      . Multiple Vitamins-Minerals (MULTIVITAMIN WITH MINERALS) tablet Take 1 tablet by mouth daily.        . niacin 250 MG tablet Take 250 mg by mouth daily with breakfast.        . pioglitazone (ACTOS) 45 MG tablet Take 1 tablet by mouth Daily.      Marland Kitchen warfarin (COUMADIN) 5 MG tablet Take 5 mg by mouth daily. Managed by Dr. Margaretmary Eddy office       No current facility-administered  medications for this visit.    Past Medical History  Diagnosis Date  . Atrial fibrillation     Paroxysmal  . Type 2 diabetes mellitus   . Hyperlipidemia   . Hypertension     Social History [name] reports that he has never smoked. He has never used smokeless tobacco. [name] reports that he does not drink alcohol.  Review of Systems Negative except as outlined.  Physical Examination Filed Vitals:   04/22/13 0821  BP: 150/65  Pulse: 49   Filed Weights   04/22/13 0821  Weight: 272 lb 1.9 oz (123.433 kg)    Morbidly obese male, comfortable at rest.  HEENT: Conjunctiva and lids normal, oropharynx with moist mucosa.  Neck: Increased girth, supple, no JVD, question soft right carotid bruit.  Lungs: Clear to auscultation, diminished, nonlabored.  Cardiac: Distant, irregular heart sounds, no S3. Indistinct PMI.  Abdomen: Morbidly obese, bowel sounds present, no tenderness.  Skin: Warm and dry.  Extremities: Trace edema below the knees, distal pulses one plus.  Musculoskeletal: No gross deformities.  Neuropsychiatric: Alert and oriented x3, affect appropriate.   Problem List and Plan   Atrial fibrillation Pernanent, continue  Coumadin under the direction of Dr. [name]. He is not requiring any rate lowering agents at this time, likely does have some degree of underlying conduction system disease which we will continue to follow.  Bilateral carotid artery occlusion Stable 40-59% bilateral ICA stenoses. Continues on aspirin and statin.  Essential hypertension, benign Blood pressure is elevated today, he states that his systolic is usually in the 130s. I asked him to keep an eye on this with Dr. [name].    Jonelle Sidle, M.D., F.A.C.C.

## 2013-04-22 NOTE — Assessment & Plan Note (Signed)
Pernanent, continue Coumadin under the direction of Dr. [name]. He is not requiring any rate lowering agents at this time, likely does have some degree of underlying conduction system disease which we will continue to follow.

## 2013-04-22 NOTE — Assessment & Plan Note (Signed)
Blood pressure is elevated today, he states that his systolic is usually in the 130s. I asked him to keep an eye on this with Dr. [name].

## 2014-05-07 ENCOUNTER — Encounter: Payer: Self-pay | Admitting: Cardiology

## 2014-05-07 ENCOUNTER — Ambulatory Visit (INDEPENDENT_AMBULATORY_CARE_PROVIDER_SITE_OTHER): Payer: Medicare PPO | Admitting: Cardiology

## 2014-05-07 VITALS — BP 117/50 | HR 45 | Ht 67.0 in | Wt 274.8 lb

## 2014-05-07 DIAGNOSIS — I10 Essential (primary) hypertension: Secondary | ICD-10-CM

## 2014-05-07 DIAGNOSIS — E782 Mixed hyperlipidemia: Secondary | ICD-10-CM

## 2014-05-07 DIAGNOSIS — I4891 Unspecified atrial fibrillation: Secondary | ICD-10-CM

## 2014-05-07 NOTE — Assessment & Plan Note (Signed)
Continue strategy of heart rate control and anticoagulation. Coumadin is followed by Dr. Sherryll BurgerShah.

## 2014-05-07 NOTE — Assessment & Plan Note (Signed)
Blood pressure is normal today. 

## 2014-05-07 NOTE — Assessment & Plan Note (Signed)
Continues on Lipitor, followed by Dr. Shah. 

## 2014-05-07 NOTE — Patient Instructions (Signed)
Your physician recommends that you schedule a follow-up appointment in: 1 year. You will receive a reminder letter in the mail in about 10 months reminding you to call and schedule your appointment. If you don't receive this letter, please contact our office. Your physician has recommended you make the following change in your medication:  Stop aspirin. Continue all other medications the same. 

## 2014-05-07 NOTE — Progress Notes (Signed)
Clinical Summary [name] is a 73 y.o.male last seen in July 2014. He has been stable from a cardiac perspective, does not report any significant palpitations, no exertional chest pain. Chronic stable dyspnea on exertion. 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  Dr. Sherryll BurgerShah continues to follow Coumadin. I reviewed his medications and recommended to him that he consider stopping his aspirin since he is on long-term anticoagulant.  ECG today shows rate-controlled atrial fibrillation with right bundle branch block and low voltage.   No Known Allergies  Current Outpatient Prescriptions  Medication Sig Dispense Refill  . amLODipine-valsartan (EXFORGE) 5-320 MG per tablet Take 1 tablet by mouth daily.        . Ascorbic Acid (VITAMIN C) 500 MG tablet Take 500 mg by mouth daily.        Marland Kitchen. atorvastatin (LIPITOR) 40 MG tablet Take 40 mg by mouth daily.        . chlorthalidone (HYGROTON) 25 MG tablet Take 25 mg by mouth daily.        . fish oil-omega-3 fatty acids 1000 MG capsule Take 1 g by mouth 2 (two) times daily.       Marland Kitchen. glipiZIDE (GLUCOTROL) 10 MG tablet Take 10 mg by mouth daily.       Marland Kitchen. glucosamine-chondroitin 500-400 MG tablet Take 3 tablets by mouth daily. Take 2 tablets in AM and 1 table in PM      . metFORMIN (GLUCOPHAGE) 1000 MG tablet Take 1.5 tablets by mouth every morning. & 1 in evening      . Multiple Vitamins-Minerals (MULTIVITAMIN WITH MINERALS) tablet Take 1 tablet by mouth daily.        . niacin 250 MG tablet Take 250 mg by mouth daily with breakfast.        . pioglitazone (ACTOS) 45 MG tablet Take 1 tablet by mouth Daily.      Marland Kitchen. warfarin (COUMADIN) 5 MG tablet Take 5 mg by mouth daily. Managed by Dr. Margaretmary EddyShah's office       No current facility-administered medications for this visit.    Past Medical History  Diagnosis Date  . Atrial fibrillation     Paroxysmal  . Type 2 diabetes mellitus   .  Hyperlipidemia   . Hypertension     Social History [name] reports that he has never smoked. He has never used smokeless tobacco. [name] reports that he does not drink alcohol.  Review of Systems Other systems reviewed and negative.  Physical Examination Filed Vitals:   05/07/14 1134  BP: 117/50  Pulse: 45   Filed Weights   05/07/14 1134  Weight: 274 lb 12.8 oz (124.648 kg)    Morbidly obese male, comfortable at rest.  HEENT: Conjunctiva and lids normal, oropharynx with moist mucosa.  Neck: Increased girth, supple, no JVD, question soft right carotid bruit.  Lungs: Clear to auscultation, diminished, nonlabored.  Cardiac: Distant, irregular heart sounds, no S3. Indistinct PMI.  Abdomen: Morbidly obese, bowel sounds present, no tenderness.  Skin: Warm and dry.  Extremities: Trace edema below the knees, distal pulses one plus.  Musculoskeletal: No gross deformities.  Neuropsychiatric: Alert and oriented x3, affect appropriate.   Problem List and Plan   Atrial fibrillation Continue strategy of heart rate control and anticoagulation. Coumadin is followed by Dr. Sherryll BurgerShah.  Essential hypertension, benign Blood pressure is normal today.  Mixed hyperlipidemia Continues on Lipitor, followed by Dr.  Aaron Mose, M.D., F.A.C.C.

## 2015-04-11 ENCOUNTER — Telehealth: Payer: Self-pay | Admitting: Cardiology

## 2015-04-11 NOTE — Telephone Encounter (Signed)
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   Doesn't want to have to do two separate appointments. Hoping we can do it all???

## 2015-04-11 NOTE — Telephone Encounter (Signed)
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 Patient verbalized understanding of plan.

## 2015-04-28 ENCOUNTER — Encounter: Payer: Self-pay | Admitting: *Deleted

## 2015-05-04 ENCOUNTER — Encounter: Payer: Self-pay | Admitting: Cardiology

## 2015-05-04 ENCOUNTER — Encounter: Payer: Self-pay | Admitting: *Deleted

## 2015-05-04 ENCOUNTER — Ambulatory Visit (INDEPENDENT_AMBULATORY_CARE_PROVIDER_SITE_OTHER): Payer: Medicare PPO | Admitting: Cardiology

## 2015-05-04 VITALS — BP 148/59 | HR 46 | Ht 67.0 in | Wt 275.4 lb

## 2015-05-04 DIAGNOSIS — E782 Mixed hyperlipidemia: Secondary | ICD-10-CM | POA: Diagnosis not present

## 2015-05-04 DIAGNOSIS — I4891 Unspecified atrial fibrillation: Secondary | ICD-10-CM

## 2015-05-04 DIAGNOSIS — I482 Chronic atrial fibrillation, unspecified: Secondary | ICD-10-CM

## 2015-05-04 DIAGNOSIS — I6523 Occlusion and stenosis of bilateral carotid arteries: Secondary | ICD-10-CM

## 2015-05-04 DIAGNOSIS — I10 Essential (primary) hypertension: Secondary | ICD-10-CM | POA: Diagnosis not present

## 2015-05-04 NOTE — Progress Notes (Signed)
Cardiology Office Note  Date: 05/04/2015   ID: Natale LaySam W Beirne, DOB 11/03/1940, MRN [id number]  PCP: Kirstie PeriSHAH,ASHISH, MD  Primary Cardiologist: Nona DellSamuel Kabir Brannock, MD   Chief Complaint  Patient presents with  . Atrial Fibrillation  . Carotid artery disease    History of Present Illness: Audry RilesSam W Gibbons is a 74 y.o. male last seen in July 2015. He presents for a routine cardiac follow-up. Remains essentially asymptomatic with chronic atrial fibrillation, specifically no palpitations or chest pain. He is not on any heart rate control medications at this point.  He continues on Coumadin, followed by Dr. Sherryll BurgerShah. Reports no spontaneous bleeding problems. States that he had routine lab work with Dr. Sherryll BurgerShah within the last few months, results to be requested.  Follow-up ECGs reviewed below.  Last carotid Dopplers were in 2014, we discussed getting an updated study. He denies any focal neurological symptoms.   Past Medical History  Diagnosis Date  . Atrial fibrillation     Paroxysmal  . Type 2 diabetes mellitus   . Hyperlipidemia   . Hypertension     Past Surgical History  Procedure Laterality Date  . Hernia repair    . Cataract surgery      Current Outpatient Prescriptions  Medication Sig Dispense Refill  . amLODipine-valsartan (EXFORGE) 5-320 MG per tablet Take 1 tablet by mouth daily.      . Ascorbic Acid (VITAMIN C) 500 MG tablet Take 500 mg by mouth daily.      Marland Kitchen. atorvastatin (LIPITOR) 40 MG tablet Take 40 mg by mouth daily.      . chlorthalidone (HYGROTON) 25 MG tablet Take 25 mg by mouth daily.      . fish oil-omega-3 fatty acids 1000 MG capsule Take 1 g by mouth 2 (two) times daily.     Marland Kitchen. glipiZIDE (GLUCOTROL) 10 MG tablet Take 10 mg by mouth daily.     Marland Kitchen. glucosamine-chondroitin 500-400 MG tablet Take 3 tablets by mouth daily. Take 2 tablets in AM and 1 table in PM    . metFORMIN (GLUCOPHAGE) 1000 MG tablet Take 1.5 tablets by mouth every morning. & 1 in evening    . Multiple  Vitamins-Minerals (MULTIVITAMIN WITH MINERALS) tablet Take 1 tablet by mouth daily.      . niacin 250 MG tablet Take 250 mg by mouth daily with breakfast.      . pioglitazone (ACTOS) 45 MG tablet Take 1 tablet by mouth Daily.    Marland Kitchen. warfarin (COUMADIN) 5 MG tablet Take 5 mg by mouth daily. Managed by Dr. Margaretmary EddyShah's office     No current facility-administered medications for this visit.    Allergies:  Review of patient's allergies indicates no known allergies.   Social History: The patient  reports that he has never smoked. He has never used smokeless tobacco. He reports that he does not drink alcohol or use illicit drugs.   ROS:  Please see the history of present illness. Otherwise, complete review of systems is positive for arthritic pains.  All other systems are reviewed and negative.   Physical Exam: VS:  BP 148/59 mmHg  Pulse 46  Ht 5\' 7"  (1.702 m)  Wt 275 lb 6.4 oz (124.921 kg)  BMI 43.12 kg/m2  SpO2 95%, BMI Body mass index is 43.12 kg/(m^2).  Wt Readings from Last 3 Encounters:  05/04/15 275 lb 6.4 oz (124.921 kg)  05/07/14 274 lb 12.8 oz (124.648 kg)  04/22/13 272 lb 1.9 oz (123.433 kg)    Morbidly  obese male, comfortable at rest.  HEENT: Conjunctiva and lids normal, oropharynx with moist mucosa.  Neck: Increased girth, supple, no JVD, question soft right carotid bruit.  Lungs: Clear to auscultation, diminished, nonlabored.  Cardiac: Distant, irregular heart sounds, no S3. Indistinct PMI.  Abdomen: Morbidly obese, bowel sounds present, no tenderness.  Skin: Warm and dry.  Extremities: Trace edema below the knees, distal pulses one plus.  Musculoskeletal: No gross deformities.  Neuropsychiatric: Alert and oriented x3, affect appropriate.   ECG: ECG is ordered today and shows rate-controlled atrial fibrillation with right bundle branch block and low voltage.  Other Studies Reviewed Today:  Echocardiogram 02/27/2012: Study Conclusions  - Left ventricle: TECHNICALLY  LIMITED STUDY. The cavity size was normal. Wall thickness was increased in a pattern of moderate LVH. The estimated ejection fraction was 60%. Regional wall motion abnormalities cannot be excluded. - Aortic valve: Sclerosis without stenosis. - Left atrium: The atrium was moderately dilated. - Right ventricle: The cavity size was mildly dilated. Systolic function was mildly reduced. - Right atrium: The atrium was mildly to moderately dilated.  Carotid Dopplers 04/09/2013: Bilateral 40-59% ICA stenoses.  Assessment and Plan:  1. Chronic atrial fibrillation, asymptomatic. Plan to continue anticoagulation. Coumadin is followed by Dr. [name]. He is not requiring any rate control medications.  2. Bilateral carotid artery disease, moderate as of 2014. Follow-up carotid Dopplers will be obtained.  3. Morbid obesity. We discussed diet and exercise.  4. Hyperlipidemia, on Lipitor, followed by Dr. [name]. Requesting was recent lab work.  5. Essential hypertension, on Exforge. Keep follow-up with Dr. [name].  Current medicines were reviewed with the patient today.   Orders Placed This Encounter  Procedures  . EKG 12-Lead    Disposition: FU with me in 1 year.   Signed, Jonelle Sidle, MD, [facility] 05/04/2015 8:36 AM    [facility] at [facility] [address] [place], [place], [place] [address] Phone: [phone]; Fax: [phone]

## 2015-05-04 NOTE — Patient Instructions (Signed)
Your physician recommends that you continue on your current medications as directed. Please refer to the Current Medication list given to you today. Your physician has requested that you have a carotid duplex. This test is an ultrasound of the carotid arteries in your neck. It looks at blood flow through these arteries that supply the brain with blood. Allow one hour for this exam. There are no restrictions or special instructions. Your physician recommends that you schedule a follow-up appointment in: 1 year. You will receive a reminder letter in the mail in about 10 months reminding you to call and schedule your appointment. If you don't receive this letter, please contact our office. 

## 2015-06-16 ENCOUNTER — Ambulatory Visit (INDEPENDENT_AMBULATORY_CARE_PROVIDER_SITE_OTHER): Payer: Medicare PPO

## 2015-06-16 DIAGNOSIS — I6523 Occlusion and stenosis of bilateral carotid arteries: Secondary | ICD-10-CM

## 2015-06-20 ENCOUNTER — Telehealth: Payer: Self-pay | Admitting: *Deleted

## 2015-06-20 NOTE — Telephone Encounter (Signed)
Patient informed. 

## 2015-06-20 NOTE — Telephone Encounter (Signed)
-----   Message from Jonelle Sidle, MD sent at 06/17/2015  9:52 AM EDT ----- Reviewed. There has been some progression since last study, now 60-79% bilateral ICA stenoses. Recommend follow-up study in 6 months, otherwise continue medical therapy.

## 2015-12-22 ENCOUNTER — Other Ambulatory Visit: Payer: Self-pay | Admitting: Cardiology

## 2015-12-22 DIAGNOSIS — I6523 Occlusion and stenosis of bilateral carotid arteries: Secondary | ICD-10-CM

## 2016-01-04 ENCOUNTER — Ambulatory Visit: Payer: Medicare PPO

## 2016-01-04 DIAGNOSIS — I6523 Occlusion and stenosis of bilateral carotid arteries: Secondary | ICD-10-CM | POA: Diagnosis not present

## 2016-01-06 ENCOUNTER — Telehealth: Payer: Self-pay | Admitting: *Deleted

## 2016-01-06 NOTE — Telephone Encounter (Signed)
Patient informed via vm. 

## 2016-01-06 NOTE — Telephone Encounter (Signed)
-----   Message from Jonelle SidleSamuel G McDowell, MD sent at 01/06/2016  3:07 PM EDT ----- I re-reviewed the study and also see that there is mention of "abnormal thyroid appearance" - not otherwise explained. Please let him know about this finding and also contact his PCP office with a copy of this report. Dr. Sherryll BurgerShah may want to do a thyroid scan to make sure there are no other significant issues.

## 2016-01-06 NOTE — Telephone Encounter (Signed)
Wife informed and copy sent to PCP 

## 2016-01-06 NOTE — Telephone Encounter (Signed)
-----   Message from Jonelle SidleSamuel G McDowell, MD sent at 01/05/2016  7:31 AM EDT ----- Reviewed report and recommendation. Stable 60-79% bilateral ICA stenoses with recommended follow-up testing in one year.

## 2016-05-16 NOTE — Progress Notes (Signed)
Cardiology Office Note  Date: 05/17/2016   ID: Taleb, Carmical 02-05-1941, MRN [id number]  PCP: Kirstie Peri, MD  Primary Cardiologist: [name], MD   Chief Complaint  Patient presents with  . Atrial Fibrillation    History of Present Illness: Robert Baldwin is a 75 y.o. male last seen in July 2016. He presents for a routine follow-up visit. 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 He does not report any worsening shortness of breath, no dizziness or syncope. No exertional chest pain.  He continues on Coumadin, managed by Dr. [name]. He does not report any bleeding problems.  Follow-up carotid Dopplers are outlined below, results forwarded to Dr. [name] in light of abnormal thyroid appearance. He is due for a follow-up carotid study next year.  I reviewed his medications. He remains in slow atrial fibrillation although not clearly symptomatic. He is not on any heart rate control agents.  Past Medical History:  Diagnosis Date  . Atrial fibrillation (HCC)    Paroxysmal  . Hyperlipidemia   . Hypertension   . Type 2 diabetes mellitus (HCC)     Past Surgical History:  Procedure Laterality Date  . Cataract surgery    . HERNIA REPAIR      Current Outpatient Prescriptions  Medication Sig Dispense Refill  . amLODipine-valsartan (EXFORGE) 5-320 MG per tablet Take 1 tablet by mouth daily.      . Ascorbic Acid (VITAMIN C) 500 MG tablet Take 500 mg by mouth daily.      Marland Kitchen atorvastatin (LIPITOR) 40 MG tablet Take 40 mg by mouth daily.      . chlorthalidone (HYGROTON) 25 MG tablet Take 25 mg by mouth daily.      . fish oil-omega-3 fatty acids 1000 MG capsule Take 1 g by mouth 2 (two) times daily.     Marland Kitchen glipiZIDE (GLUCOTROL) 10 MG tablet Take 10 mg by mouth daily.     Marland Kitchen glucosamine-chondroitin 500-400 MG tablet Take 3 tablets by mouth daily. Take 2 tablets in AM and 1 table in PM    . metFORMIN (GLUCOPHAGE) 1000 MG tablet Take 1.5  tablets by mouth every morning. & 1 in evening    . Multiple Vitamins-Minerals (MULTIVITAMIN WITH MINERALS) tablet Take 1 tablet by mouth daily.      . niacin 250 MG tablet Take 250 mg by mouth daily with breakfast.      . pioglitazone (ACTOS) 45 MG tablet Take 1 tablet by mouth Daily.    Marland Kitchen warfarin (COUMADIN) 5 MG tablet Take 5 mg by mouth daily. Managed by Dr. Margaretmary Eddy office     No current facility-administered medications for this visit.    Allergies:  Review of patient's allergies indicates no known allergies.   Social History: The patient  reports that he has never smoked. He has never used smokeless tobacco. He reports that he does not drink alcohol or use drugs.   ROS:  Please see the history of present illness. Otherwise, complete review of systems is positive for arthritic pain and stiffness.  All other systems are reviewed and negative.   Physical Exam: VS:  BP 138/60   Pulse (!) 43   Ht 5\' 7"  (1.702 m)   Wt 272 lb (123.4 kg)   SpO2 98%   BMI 42.60 kg/m , BMI Body mass index is 42.6 kg/m.  Wt Readings from Last 3 Encounters:  05/17/16 272 lb (123.4 kg)  05/04/15 275 lb 6.4  oz (124.9 kg)  05/07/14 274 lb 12.8 oz (124.6 kg)    Morbidly obese male, comfortable at rest.  HEENT: Conjunctiva and lids normal, oropharynx with moist mucosa.  Neck: Increased girth, supple, no JVD, question soft right carotid bruit.  Lungs: Clear to auscultation, diminished, nonlabored.  Cardiac: Distant, irregular heart sounds, no S3. Indistinct PMI.  Abdomen: Morbidly obese, bowel sounds present, no tenderness.  Skin: Warm and dry.  Extremities: Trace edema below the knees, distal pulses one plus.  Musculoskeletal: No gross deformities.  Neuropsychiatric: Alert and oriented x3, affect appropriate.  ECG: I personally reviewed the tracing from 05/04/2015 which showed rate-controlled atrial fibrillation with right bundle branch block, rightward axis and low voltage  Recent  Labwork:  June 2016: INR 2.20 Feb 2015: TSH 0.96, cholesterol 118, triglycerides 50, HDL 40, LDL 60, AST 24, ALT 19, hemoglobin 12.7, platelets 221, BUN 26, creatinine 0.9, potassium 4.4  Other Studies Reviewed Today:  Echocardiogram 02/27/2012: Study Conclusions  - Left ventricle: TECHNICALLY LIMITED STUDY. The cavity size was normal. Wall thickness was increased in a pattern of moderate LVH. The estimated ejection fraction was 60%. Regional wall motion abnormalities cannot be excluded. - Aortic valve: Sclerosis without stenosis. - Left atrium: The atrium was moderately dilated. - Right ventricle: The cavity size was mildly dilated. Systolic function was mildly reduced. - Right atrium: The atrium was mildly to moderately dilated.  Carotid Dopplers 01/04/2016: Stable 60-79% bilateral ICA stenosis. Abnormal thyroid appearance.  Assessment and Plan:  1. Chronic atrial fibrillation, slow ventricular response at baseline, although not clearly symptomatic. He is not on any heart rate control medications. Continue on Coumadin with follow-up per Dr. [name].  2. Essential hypertension, blood pressure control is adequate today.  3. Carotid artery disease, stable 60-79% bilateral ICA stenoses by Doppler study in March. He will have a follow-up study next year.   4. Hyperlipidemia, on statin therapy, LDL was 60 last year.  Current medicines were reviewed with the patient today.   Orders Placed This Encounter  Procedures  . EKG 12-Lead    Disposition: Follow-up with me in one year.  Signed, Jonelle Sidle, MD, [facility] 05/17/2016 9:55 AM    [facility] at [facility] [address] [place], [place], [place] [address] Phone: [phone]; Fax: [phone]

## 2016-05-17 ENCOUNTER — Ambulatory Visit (INDEPENDENT_AMBULATORY_CARE_PROVIDER_SITE_OTHER): Payer: Medicare PPO | Admitting: Cardiology

## 2016-05-17 ENCOUNTER — Encounter: Payer: Self-pay | Admitting: Cardiology

## 2016-05-17 VITALS — BP 138/60 | HR 43 | Ht 67.0 in | Wt 272.0 lb

## 2016-05-17 DIAGNOSIS — I482 Chronic atrial fibrillation, unspecified: Secondary | ICD-10-CM

## 2016-05-17 DIAGNOSIS — I6523 Occlusion and stenosis of bilateral carotid arteries: Secondary | ICD-10-CM

## 2016-05-17 DIAGNOSIS — E782 Mixed hyperlipidemia: Secondary | ICD-10-CM

## 2016-05-17 DIAGNOSIS — I10 Essential (primary) hypertension: Secondary | ICD-10-CM | POA: Diagnosis not present

## 2016-05-17 NOTE — Patient Instructions (Signed)
Medication Instructions:   Your physician recommends that you continue on your current medications as directed. Please refer to the Current Medication list given to you today.  Labwork: NONE  Testing/Procedures: Your physician has requested that you have a carotid duplex in March 2018. This test is an ultrasound of the carotid arteries in your neck. It looks at blood flow through these arteries that supply the brain with blood. Allow one hour for this exam. There are no restrictions or special instructions.  Follow-Up:  Your physician recommends that you schedule a follow-up appointment in: 1 year. You will receive a reminder letter in the mail in about 10 months reminding you to call and schedule your appointment. If you don't receive this letter, please contact our office.  Any Other Special Instructions Will Be Listed Below (If Applicable).  If you need a refill on your cardiac medications before your next appointment, please call your pharmacy.

## 2016-08-17 ENCOUNTER — Encounter (HOSPITAL_COMMUNITY): Payer: Self-pay

## 2016-08-17 ENCOUNTER — Encounter (HOSPITAL_COMMUNITY)
Admission: RE | Disposition: A | Discharge status: Home / Self Care | Payer: Self-pay | Source: Ambulatory Visit | Attending: Ophthalmology

## 2016-08-17 ENCOUNTER — Ambulatory Visit (HOSPITAL_COMMUNITY)
Admission: RE | Admit: 2016-08-17 | Discharge: 2016-08-17 | Disposition: A | Discharge status: Home / Self Care | Payer: Medicare PPO | Source: Ambulatory Visit | Attending: Ophthalmology | Admitting: Ophthalmology

## 2016-08-17 DIAGNOSIS — I10 Essential (primary) hypertension: Secondary | ICD-10-CM | POA: Insufficient documentation

## 2016-08-17 DIAGNOSIS — H2640 Unspecified secondary cataract: Secondary | ICD-10-CM | POA: Diagnosis present

## 2016-08-17 HISTORY — PX: YAG LASER APPLICATION: SHX6189

## 2016-08-17 SURGERY — TREATMENT, USING YAG LASER
Anesthesia: LOCAL | Laterality: Right

## 2016-08-17 MED ORDER — TETRACAINE HCL 0.5 % OP SOLN
1.0000 [drp] | Freq: Once | OPHTHALMIC | Status: AC
  Administered 2016-08-17: 1 [drp] via OPHTHALMIC

## 2016-08-17 MED ORDER — TROPICAMIDE 1 % OP SOLN
1.0000 [drp] | OPHTHALMIC | Status: AC
  Administered 2016-08-17 (×3): 1 [drp] via OPHTHALMIC

## 2016-08-17 MED ORDER — TETRACAINE HCL 0.5 % OP SOLN
OPHTHALMIC | Status: AC
  Filled 2016-08-17: qty 4

## 2016-08-17 MED ORDER — TROPICAMIDE 1 % OP SOLN
OPHTHALMIC | Status: AC
  Filled 2016-08-17: qty 3

## 2016-08-17 NOTE — Brief Op Note (Signed)
Robert RilesSam W Baldwin 08/17/2016  Robert Simmondsarroll F Ulrich Soules, MD  Pre-op Diagnosis:  secondary cataract right eye  Post-op Diagnosis: same  Yag laser self-test completed: Yes.    Indications:  See scanned office H&P for detailed indications  Procedure: YAG possterior capsulotomy OD  Eye protection worn by staff:  Yes.   Laser In Use sign on door:  Yes.    Laser:  {LUMENIS YAG/SLT LASER  Power Setting:  1.7 mJ/burst Anatomical site treated:  Posterior capsule OD Number of applications:  63 Total energy delivered: 104.22 mJ Results:  Central strand remains bur ends incised  The patient was discharged home with instructions to continue all his current glaucoma medications in the un-operated eye, and discontinue all his current glaucoma medications, if any.  Patient was instructed to go to the office, as previously scheduled, for intraocular pressure:  Yes.    Patient verbalizes understanding of discharge instructions:  Yes.    Notes:  Tolerated procedure well, no complications

## 2016-08-17 NOTE — H&P (Signed)
I have reviewed the pre printed H&P, the patient was re-examined, and I have identified no significant interval changes in the patient's medical condition.  There is no change in the plan of care since the history and physical of record. 

## 2016-08-17 NOTE — Discharge Instructions (Addendum)
Robert RilesSam W Baldwin  08/17/2016     Instructions    Activity: No Restrictions.   Diet: Resume Diet you were on at home.   Pain Medication: Tylenol if Needed.   CONTACT YOUR DOCTOR IF YOU HAVE PAIN, REDNESS IN YOUR EYE, OR DECREASED VISION.    FOLLOW UP WITH DR HAINES ON 08/20/2016 @ 10:30 AM  Follow-up:in 3 days with Susa Simmondsarroll F Haines, MD.   Dr. Nile RiggsShapiro: [phone]  Dr. Lita MainsHaines: [phone]  Dr. Alto DenverHunt: [phone]: [phone]   If you find that you cannot contact your physician, but feel that your signs and   Symptoms warrant a physician's attention, call the Emergency Room at   [phone] ext.[phone].   Othern/a.

## 2016-08-21 ENCOUNTER — Encounter (HOSPITAL_COMMUNITY): Payer: Self-pay | Admitting: Ophthalmology

## 2017-01-17 ENCOUNTER — Ambulatory Visit: Payer: Medicare PPO

## 2017-01-17 DIAGNOSIS — I6523 Occlusion and stenosis of bilateral carotid arteries: Secondary | ICD-10-CM

## 2017-01-18 ENCOUNTER — Telehealth: Payer: Self-pay

## 2017-01-18 DIAGNOSIS — I6523 Occlusion and stenosis of bilateral carotid arteries: Secondary | ICD-10-CM

## 2017-01-18 NOTE — Telephone Encounter (Signed)
PATIENT NOTIFIED ROUTED TO PCP  

## 2017-05-14 ENCOUNTER — Encounter: Payer: Self-pay | Admitting: *Deleted

## 2017-05-14 NOTE — Progress Notes (Signed)
Cardiology Office Note  Date: 05/15/2017   ID: Sohum, Delillo 06-21-1941, MRN [id number]  PCP: Kirstie Peri, MD  Primary Cardiologist: [name], MD   Chief Complaint  Patient presents with  . Atrial Fibrillation    History of Present Illness: Robert Baldwin is a 76 y.o. male last seen in July 2017. He presents for a routine follow-up visit. Reports no palpitations, chest pain, dizziness, or syncope. 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 No change in stamina. He states that he he was seen in Dr. Margaretmary Eddy office recently for a Coumadin check, noted to be bradycardic resulting in placement of a monitor, results are pending. Of note, Mr. Messina has had a heart rate in the 40s for the last 4-5 years, no obvious limitations. He is not on any specific heart rate lowering medications.  He continues to follow with Dr. [name] on Coumadin. No reported bleeding problems.  Follow-up carotid Dopplers from earlier this year are outlined below. We discussed the results today.  I personally reviewed his ECG today which showed slow atrial fibrillation with right bundle branch block and low voltage.  Past Medical History:  Diagnosis Date  . Atrial fibrillation (HCC)    Paroxysmal  . Hyperlipidemia   . Hypertension   . Type 2 diabetes mellitus (HCC)     Past Surgical History:  Procedure Laterality Date  . Cataract surgery    . HERNIA REPAIR    . YAG LASER APPLICATION Right 08/17/2016   Procedure: YAG LASER APPLICATION;  Surgeon: Susa Simmonds, MD;  Location: [facility];  Service: Ophthalmology;  Laterality: Right;    Current Outpatient Prescriptions  Medication Sig Dispense Refill  . amLODipine (NORVASC) 5 MG tablet Take 5 mg by mouth daily.    . Ascorbic Acid (VITAMIN C) 500 MG tablet Take 500 mg by mouth daily.      Marland Kitchen aspirin EC 81 MG tablet Take 81 mg by mouth daily.    Marland Kitchen atorvastatin (LIPITOR) 40 MG tablet Take 40 mg by mouth daily.      . chlorthalidone  (HYGROTON) 25 MG tablet Take 25 mg by mouth daily.      . fish oil-omega-3 fatty acids 1000 MG capsule Take 1 g by mouth 2 (two) times daily.     Marland Kitchen glipiZIDE (GLUCOTROL XL) 10 MG 24 hr tablet Take 10 mg by mouth daily.    . Glucosamine 500 MG CAPS Take 500 mg by mouth 2 (two) times daily.    . metFORMIN (GLUCOPHAGE) 1000 MG tablet Take 1,000-1,500 mg by mouth every morning. 1500 mg in the morning & 1000 mg in evening    . Multiple Vitamins-Minerals (MULTIVITAMIN WITH MINERALS) tablet Take 1 tablet by mouth daily.      . niacin 250 MG tablet Take 250 mg by mouth daily with breakfast.      . pioglitazone (ACTOS) 45 MG tablet Take 45 mg by mouth Daily.     . valsartan (DIOVAN) 320 MG tablet Take 320 mg by mouth daily.    Marland Kitchen warfarin (COUMADIN) 5 MG tablet Take 2.5-7.5 mg by mouth daily. Managed by Dr. Margaretmary Eddy office Monday, Friday, & Saturday 7.5 mg,  Tuesday,Thursday, & Sunday- 5 mg,  Wednesday-2.5 mg     No current facility-administered medications for this visit.    Allergies:  Patient has no known allergies.   Social History: The patient  reports that he has never smoked. He has never used smokeless tobacco. He reports  that he does not drink alcohol or use drugs.   ROS:  Please see the history of present illness. Otherwise, complete review of systems is positive for arthritic pain.  All other systems are reviewed and negative.   Physical Exam: VS:  BP 136/69   Pulse (!) 48   Ht 5\' 7"  (1.702 m)   Wt 266 lb 6.4 oz (120.8 kg)   BMI 41.72 kg/m , BMI Body mass index is 41.72 kg/m.  Wt Readings from Last 3 Encounters:  05/15/17 266 lb 6.4 oz (120.8 kg)  05/17/16 272 lb (123.4 kg)  05/04/15 275 lb 6.4 oz (124.9 kg)    Morbidly obese male, comfortable at rest.  HEENT: Conjunctiva and lids normal, oropharynx with moist mucosa.  Neck: Increased girth, supple, no JVD, soft right carotid bruit.  Lungs: Clear to auscultation, diminished, nonlabored.  Cardiac: Distant, irregular heart  sounds, no S3. Indistinct PMI.  Abdomen: Morbidly obese, bowel sounds present, no tenderness.  Skin: Warm and dry.  Extremities: Trace edema below the knees, distal pulses one plus.  Musculoskeletal: No gross deformities.  Neuropsychiatric: Alert and oriented x3, affect appropriate.  ECG: I personally reviewed the tracing from 05/17/2016 which showed slow atrial fibrillation with nonspecific ST-T changes.  Recent Labwork:  May 2016: TSH 0.96, cholesterol 118, triglycerides 50, HDL 40, LDL 60, AST 24, ALT 19, hemoglobin 12.7, platelets 221, BUN 26, creatinine 0.9, potassium 4.4  Other Studies Reviewed Today:  Echocardiogram 02/27/2012: Study Conclusions  - Left ventricle: TECHNICALLY LIMITED STUDY. The cavity size was normal. Wall thickness was increased in a pattern of moderate LVH. The estimated ejection fraction was 60%. Regional wall motion abnormalities cannot be excluded. - Aortic valve: Sclerosis without stenosis. - Left atrium: The atrium was moderately dilated. - Right ventricle: The cavity size was mildly dilated. Systolic function was mildly reduced. - Right atrium: The atrium was mildly to moderately dilated.  Carotid Dopplers 01/17/2017: Stable 40-59% bilateral ICA stenoses.  Assessment and Plan:  1. Chronic atrial fibrillation with slow ventricular response, not clearly symptomatic, specifically denies dizziness or syncope. He did have a heart monitor placed per Dr. Margaretmary EddyShah's office, will request the results. Otherwise continue observation on Coumadin. He is not on specific heart rate lowering medications.  2. Essential hypertension, on Norvasc, chlorthalidone, and Diovan. No changes were made today.  3. Hyperlipidemia, on Lipitor. Keep follow-up with Dr. Sherryll BurgerShah.  4. Moderate bilateral carotid artery disease, 40-59% by Dopplers earlier this year. Follow-up study for next year.  Current medicines were reviewed with the patient today.   Orders Placed This  Encounter  Procedures  . EKG 12-Lead    Disposition: Follow-up in one year, sooner if needed.  Signed, Jonelle SidleSamuel G. Chay Mazzoni, MD, [facility] 05/15/2017 9:22 AM    [facility] [address] [place], [place], [place] [address] Phone: [phone]; Fax: [phone]

## 2017-05-15 ENCOUNTER — Encounter: Payer: Self-pay | Admitting: Cardiology

## 2017-05-15 ENCOUNTER — Ambulatory Visit: Payer: Medicare PPO | Admitting: Cardiology

## 2017-05-15 ENCOUNTER — Ambulatory Visit (INDEPENDENT_AMBULATORY_CARE_PROVIDER_SITE_OTHER): Payer: Medicare PPO | Admitting: Cardiology

## 2017-05-15 VITALS — BP 136/69 | HR 48 | Ht 67.0 in | Wt 266.4 lb

## 2017-05-15 DIAGNOSIS — I482 Chronic atrial fibrillation, unspecified: Secondary | ICD-10-CM

## 2017-05-15 DIAGNOSIS — E782 Mixed hyperlipidemia: Secondary | ICD-10-CM

## 2017-05-15 DIAGNOSIS — I10 Essential (primary) hypertension: Secondary | ICD-10-CM

## 2017-05-15 DIAGNOSIS — R001 Bradycardia, unspecified: Secondary | ICD-10-CM | POA: Diagnosis not present

## 2017-05-15 DIAGNOSIS — I6523 Occlusion and stenosis of bilateral carotid arteries: Secondary | ICD-10-CM | POA: Diagnosis not present

## 2017-05-15 NOTE — Patient Instructions (Signed)

## 2017-05-22 ENCOUNTER — Telehealth: Payer: Self-pay | Admitting: Cardiology

## 2017-05-22 NOTE — Telephone Encounter (Signed)
Patient brought by a cardiac monitor for me to review, ordered by Dr Sherryll BurgerShah. We had actually already discussed this when I saw him recently in the office. He has atrial fibrillation with slow ventricular response, and bradycardia has been present for quite some time now (years). He has been stable without any obvious symptoms, specifically no dizziness or syncope. His average heart rate by the monitor was 48. The lowest heart rate recorded occurred during early morning hours when he was sleeping. For now I would continue to suggest observation unless he becomes symptomatic. He is not on any medications to specifically slow his heart rate. Of note, if he has not already had an evaluation for sleep apnea, this may also be useful as uncontrolled sleep apnea can contribute to bradycardia. He should discuss this with Dr. Sherryll BurgerShah.

## 2017-05-22 NOTE — Telephone Encounter (Signed)
Patients wife notified

## 2017-05-22 NOTE — Telephone Encounter (Signed)
[name] walked into the office today with a report from Dr. Milagros ReapVyas ofice. He is requesting that Dr. Diona BrownerMcDowell review and advise if he needs to be seen sooner.  Will place report on Dr.McDowell's desk. [name] would like a call back.

## 2017-07-22 ENCOUNTER — Ambulatory Visit: Payer: Medicare PPO | Admitting: Cardiology

## 2017-08-09 ENCOUNTER — Encounter: Payer: Self-pay | Admitting: *Deleted

## 2017-08-12 ENCOUNTER — Ambulatory Visit (INDEPENDENT_AMBULATORY_CARE_PROVIDER_SITE_OTHER): Payer: Medicare PPO | Admitting: Cardiology

## 2017-08-12 ENCOUNTER — Encounter: Payer: Self-pay | Admitting: Cardiology

## 2017-08-12 ENCOUNTER — Telehealth: Payer: Self-pay | Admitting: Cardiology

## 2017-08-12 VITALS — BP 149/71 | HR 47 | Ht 67.0 in | Wt 263.8 lb

## 2017-08-12 DIAGNOSIS — I482 Chronic atrial fibrillation, unspecified: Secondary | ICD-10-CM

## 2017-08-12 DIAGNOSIS — I10 Essential (primary) hypertension: Secondary | ICD-10-CM

## 2017-08-12 DIAGNOSIS — E782 Mixed hyperlipidemia: Secondary | ICD-10-CM

## 2017-08-12 DIAGNOSIS — R001 Bradycardia, unspecified: Secondary | ICD-10-CM

## 2017-08-12 DIAGNOSIS — I6523 Occlusion and stenosis of bilateral carotid arteries: Secondary | ICD-10-CM | POA: Diagnosis not present

## 2017-08-12 NOTE — Patient Instructions (Signed)
Medication Instructions:  Your physician recommends that you continue on your current medications as directed. Please refer to the Current Medication list given to you today.  Labwork: NONE  Testing/Procedures: Your physician has requested that you have an exercise tolerance test. For further information please visit [email or web address] Please also follow instruction sheet, as given.  Follow-Up: Your physician recommends that you schedule a follow-up appointment PENDING TEST RESULTS  Any Other Special Instructions Will Be Listed Below (If Applicable).  If you need a refill on your cardiac medications before your next appointment, please call your pharmacy.

## 2017-08-12 NOTE — Telephone Encounter (Signed)
[facility] 2017 arrive 10

## 2017-08-12 NOTE — Progress Notes (Signed)
Cardiology Office Note  Date: 08/12/2017   ID: Robert Baldwin, DOB 01/28/1941, MRN [id number]  PCP: Kirstie PeriShah, Ashish, MD  Primary Cardiologist: Nona DellSamuel Jennye Runquist, MD   Chief Complaint  Patient presents with  . Atrial Fibrillation    History of Present Illness: Robert Baldwin is a 76 y.o. male last seen in July. He scheduled a follow-up office visit to discuss some concerns he has had. He states that his wife and Dr. Sherryll BurgerShah have been worried about his slow heart rate. 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 He is not on any AV nodal blockers. 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 He states that his wife tells him however that he "does things differently than he used to." He does not report any chest pain.  He continues on Coumadin with follow-up per Dr. Sherryll BurgerShah. He denies any bleeding episodes.  I reviewed his medications which are outlined below and stable.  Past Medical History:  Diagnosis Date  . Atrial fibrillation (HCC)   . Hyperlipidemia   . Hypertension   . Type 2 diabetes mellitus (HCC)     Past Surgical History:  Procedure Laterality Date  . Cataract surgery    . HERNIA REPAIR    . YAG LASER APPLICATION Right 08/17/2016   Procedure: YAG LASER APPLICATION;  Surgeon: Susa Simmondsarroll F Haines, MD;  Location: [facility];  Service: Ophthalmology;  Laterality: Right;    Current Outpatient Prescriptions  Medication Sig Dispense Refill  . amLODipine-valsartan (EXFORGE) 5-320 MG tablet Take 1 tablet by mouth daily.    . Ascorbic Acid (VITAMIN C) 500 MG tablet Take 500 mg by mouth daily.      Marland Kitchen. aspirin EC 81 MG tablet Take 81 mg by mouth daily.    Marland Kitchen. atorvastatin (LIPITOR) 40 MG tablet Take 40 mg by mouth daily.      . chlorthalidone (HYGROTON) 25 MG  tablet Take 25 mg by mouth daily.      . fish oil-omega-3 fatty acids 1000 MG capsule Take 1 g by mouth 2 (two) times daily.     Marland Kitchen. glipiZIDE (GLUCOTROL XL) 10 MG 24 hr tablet Take 10 mg by mouth daily.    . Glucosamine 500 MG CAPS Take 500 mg by mouth 2 (two) times daily.    . metFORMIN (GLUCOPHAGE) 1000 MG tablet Take 1,500 mg by mouth every morning.     . Multiple Vitamins-Minerals (MULTIVITAMIN WITH MINERALS) tablet Take 1 tablet by mouth daily.      . niacin 250 MG tablet Take 250 mg by mouth daily with breakfast.      . pioglitazone (ACTOS) 45 MG tablet Take 45 mg by mouth Daily.     Marland Kitchen. warfarin (COUMADIN) 5 MG tablet Take 2.5-7.5 mg by mouth daily. Managed by Dr. Margaretmary EddyShah's office Monday, Friday, & Saturday 7.5 mg,  Tuesday,Thursday, & Sunday- 5 mg,  Wednesday-2.5 mg     No current facility-administered medications for this visit.    Allergies:  Patient has no known allergies.   Social History: The patient  reports that he has never smoked. He has never used smokeless tobacco. He reports that he does not drink alcohol or use drugs.   ROS:  Please see the history of present  illness. Otherwise, complete review of systems is positive for hearing loss.  All other systems are reviewed and negative.   Physical Exam: VS:  BP (!) 149/71   Pulse (!) 47   Ht 5\' 7"  (1.702 m)   Wt 263 lb 12.8 oz (119.7 kg)   BMI 41.32 kg/m , BMI Body mass index is 41.32 kg/m.  Wt Readings from Last 3 Encounters:  08/12/17 263 lb 12.8 oz (119.7 kg)  05/15/17 266 lb 6.4 oz (120.8 kg)  05/17/16 272 lb (123.4 kg)    General: Morbidly obese male, appears comfortable at rest. HEENT: Conjunctiva and lids normal, oropharynx clear with moist mucosa. Neck: Supple, no elevated JVP, soft right carotid bruit, no thyromegaly. Lungs: Clear to auscultation, nonlabored breathing at rest. Cardiac: Irregularly irregular, no S3 or significant systolic murmur, no pericardial rub. Abdomen: Obese, nontender, bowel sounds  present, no guarding or rebound. Extremities: Trace ankle edema edema, distal pulses 2+. Skin: Warm and dry. Musculoskeletal: No kyphosis. Neuropsychiatric: Alert and oriented x3, affect grossly appropriate.  ECG: I personally reviewed the tracing from 05/15/2017 which showed slow atrial fibrillation with low voltage and right bundle branch block.  Recent Labwork:  May 2016: TSH 0.96, LDL 60, BUN 26, creatinine 0.9, potassium 4.4, hemoglobin 12.7  Other Studies Reviewed Today:  Carotid Dopplers 01/17/2017: Stable 40-59% bilateral ICA stenoses.  Assessment and Plan:  1. Chronic atrial fibrillation with slow ventricular response. As discussed previously, I am not certain that this is representing a true limitation to him or not based on his description of functional capacity. He states that his wife however has been more concerned about him. I reviewed the monitor arranged by Dr. [name] already. We will obtain a GXT to evaluate for potential chronotropic incompetence. He is not on any AV nodal blockers. Continue Coumadin with follow-up per Dr. [name].  2. Essential hypertension, continues on Norvasc, chlorthalidone, and Diovan.  3. Hyperlipidemia on Lipitor. He follows with Dr. [name].  4. Stable bilateral carotid artery disease, overall moderate by Dopplers in March of this year. He remains on aspirin and statin.  Current medicines were reviewed with the patient today.   Orders Placed This Encounter  Procedures  . Exercise Tolerance Test    Disposition: Call with test results and determine follow-up plan.  Signed, Jonelle Sidle, MD, [facility] 08/12/2017 8:54 AM    [facility] at [facility] [address] [place], [place], [place] [address] Phone: [phone]; Fax: [phone]

## 2017-08-22 ENCOUNTER — Ambulatory Visit (HOSPITAL_COMMUNITY)
Admission: RE | Admit: 2017-08-22 | Discharge: 2017-08-22 | Disposition: A | Discharge status: Home / Self Care | Payer: Medicare PPO | Source: Ambulatory Visit | Attending: Internal Medicine | Admitting: Internal Medicine

## 2017-08-22 DIAGNOSIS — R001 Bradycardia, unspecified: Secondary | ICD-10-CM | POA: Diagnosis not present

## 2017-08-22 DIAGNOSIS — I472 Ventricular tachycardia: Secondary | ICD-10-CM | POA: Diagnosis not present

## 2017-08-22 DIAGNOSIS — I4891 Unspecified atrial fibrillation: Secondary | ICD-10-CM | POA: Insufficient documentation

## 2017-08-22 DIAGNOSIS — I493 Ventricular premature depolarization: Secondary | ICD-10-CM | POA: Diagnosis not present

## 2017-08-22 LAB — EXERCISE TOLERANCE TEST
CHL CUP MPHR: 144 {beats}/min
CHL CUP RESTING HR STRESS: 47 {beats}/min
CSEPEDS: 11 s
CSEPPHR: 113 {beats}/min
Estimated workload: 2.5 METS
Exercise duration (min): 3 min
Percent HR: 78 %
RPE: 13

## 2017-08-23 ENCOUNTER — Telehealth: Payer: Self-pay

## 2017-08-23 NOTE — Telephone Encounter (Signed)
Wife notified. Routed to PCP

## 2017-08-23 NOTE — Telephone Encounter (Signed)
-----   Message from Norva PavlovKailey Lion Fernandez, LPN sent at 16/1/096011/11/2016  8:28 AM EDT -----   ----- Message ----- From: Jonelle SidleMcDowell, Samuel G, MD Sent: 08/22/2017   3:25 PM To: Eustace MooreLydia M Anderson, LPN  Results reviewed.  This test was obtained to evaluate for chronotropic incompetence specifically.  His heart rate rose appropriately from 47 bpm up to 111 bpm which is a reasonable response, although technically nondiagnostic in that it did not achieve 85% maximum heart rate.  For now would continue with observation in terms of his heart rhythm.  This would not indicate that he specifically needs a pacemaker at this time. A copy of this test should be forwarded to Kirstie PeriShah, Ashish, MD.

## 2018-01-22 ENCOUNTER — Ambulatory Visit (INDEPENDENT_AMBULATORY_CARE_PROVIDER_SITE_OTHER): Payer: Medicare PPO

## 2018-01-22 DIAGNOSIS — I6523 Occlusion and stenosis of bilateral carotid arteries: Secondary | ICD-10-CM | POA: Diagnosis not present

## 2018-01-23 ENCOUNTER — Telehealth: Payer: Self-pay

## 2018-01-23 NOTE — Telephone Encounter (Signed)
Patients wife notified. Routed to PCP  

## 2018-01-23 NOTE — Telephone Encounter (Signed)
-----   Message from Jonelle SidleSamuel G McDowell, MD sent at 01/23/2018  9:48 AM EDT ----- Results reviewed.  Stable bilateral ICA stenoses of 40-59%. A copy of this test should be forwarded to Kirstie PeriShah, Ashish, MD.

## 2018-03-25 DIAGNOSIS — C44319 Basal cell carcinoma of skin of other parts of face: Secondary | ICD-10-CM | POA: Diagnosis not present

## 2018-03-25 DIAGNOSIS — D485 Neoplasm of uncertain behavior of skin: Secondary | ICD-10-CM | POA: Diagnosis not present

## 2018-03-25 DIAGNOSIS — C44399 Other specified malignant neoplasm of skin of other parts of face: Secondary | ICD-10-CM | POA: Diagnosis not present

## 2018-03-27 DIAGNOSIS — I10 Essential (primary) hypertension: Secondary | ICD-10-CM | POA: Diagnosis not present

## 2018-03-27 DIAGNOSIS — Z6841 Body Mass Index (BMI) 40.0 and over, adult: Secondary | ICD-10-CM | POA: Diagnosis not present

## 2018-03-27 DIAGNOSIS — Z79899 Other long term (current) drug therapy: Secondary | ICD-10-CM | POA: Diagnosis not present

## 2018-03-27 DIAGNOSIS — E7800 Pure hypercholesterolemia, unspecified: Secondary | ICD-10-CM | POA: Diagnosis not present

## 2018-03-27 DIAGNOSIS — Z299 Encounter for prophylactic measures, unspecified: Secondary | ICD-10-CM | POA: Diagnosis not present

## 2018-03-27 DIAGNOSIS — Z0000 Encounter for general adult medical examination without abnormal findings: Secondary | ICD-10-CM | POA: Diagnosis not present

## 2018-03-27 DIAGNOSIS — R5383 Other fatigue: Secondary | ICD-10-CM | POA: Diagnosis not present

## 2018-03-27 DIAGNOSIS — Z1339 Encounter for screening examination for other mental health and behavioral disorders: Secondary | ICD-10-CM | POA: Diagnosis not present

## 2018-03-27 DIAGNOSIS — Z125 Encounter for screening for malignant neoplasm of prostate: Secondary | ICD-10-CM | POA: Diagnosis not present

## 2018-03-27 DIAGNOSIS — Z789 Other specified health status: Secondary | ICD-10-CM | POA: Diagnosis not present

## 2018-03-27 DIAGNOSIS — Z7189 Other specified counseling: Secondary | ICD-10-CM | POA: Diagnosis not present

## 2018-03-27 DIAGNOSIS — Z1331 Encounter for screening for depression: Secondary | ICD-10-CM | POA: Diagnosis not present

## 2018-03-27 DIAGNOSIS — Z1211 Encounter for screening for malignant neoplasm of colon: Secondary | ICD-10-CM | POA: Diagnosis not present

## 2018-04-29 DIAGNOSIS — I4891 Unspecified atrial fibrillation: Secondary | ICD-10-CM | POA: Diagnosis not present

## 2018-04-29 DIAGNOSIS — I10 Essential (primary) hypertension: Secondary | ICD-10-CM | POA: Diagnosis not present

## 2018-04-29 DIAGNOSIS — Z299 Encounter for prophylactic measures, unspecified: Secondary | ICD-10-CM | POA: Diagnosis not present

## 2018-04-29 DIAGNOSIS — I272 Pulmonary hypertension, unspecified: Secondary | ICD-10-CM | POA: Diagnosis not present

## 2018-04-29 DIAGNOSIS — E1165 Type 2 diabetes mellitus with hyperglycemia: Secondary | ICD-10-CM | POA: Diagnosis not present

## 2018-04-29 DIAGNOSIS — E162 Hypoglycemia, unspecified: Secondary | ICD-10-CM | POA: Diagnosis not present

## 2018-04-29 DIAGNOSIS — Z6841 Body Mass Index (BMI) 40.0 and over, adult: Secondary | ICD-10-CM | POA: Diagnosis not present

## 2018-05-07 DIAGNOSIS — Z6841 Body Mass Index (BMI) 40.0 and over, adult: Secondary | ICD-10-CM | POA: Diagnosis not present

## 2018-05-07 DIAGNOSIS — E1165 Type 2 diabetes mellitus with hyperglycemia: Secondary | ICD-10-CM | POA: Diagnosis not present

## 2018-05-07 DIAGNOSIS — Z713 Dietary counseling and surveillance: Secondary | ICD-10-CM | POA: Diagnosis not present

## 2018-05-07 DIAGNOSIS — I10 Essential (primary) hypertension: Secondary | ICD-10-CM | POA: Diagnosis not present

## 2018-05-07 DIAGNOSIS — Z299 Encounter for prophylactic measures, unspecified: Secondary | ICD-10-CM | POA: Diagnosis not present

## 2018-05-19 ENCOUNTER — Encounter: Payer: Self-pay | Admitting: *Deleted

## 2018-05-19 NOTE — Progress Notes (Signed)
Cardiology Office Note  Date: 05/20/2018   ID: Robert Baldwin, DOB 05/13/1941, MRN [id number]  PCP: Kirstie PeriShah, Ashish, MD  Primary Cardiologist: Nona DellSamuel Zubair Lofton, MD   Chief Complaint  Patient presents with  . Atrial Fibrillation    History of Present Illness: Robert Baldwin is a 77 y.o. male last seen in October 2018.  He is here for a routine follow-up visit.  States that he feels well.  He has had some fluctuations in his glucose and underwent medication adjustments by Dr. Sherryll BurgerShah.  He does not report any palpitations or exertional chest pain.  He has had no sudden dizziness or syncope.  He remains on Coumadin with follow-up per Dr. Sherryll BurgerShah.  He underwent a GXT in November of last year which showed reasonable heart rate response, not diagnostic for chronotropic incompetence specifically.  Follow-up carotid Dopplers from April showed stable, moderate bilateral ICA stenoses.  Discussed this today.  We have kept him on low-dose aspirin as well as Lipitor.  I personally reviewed his ECG today which shows atrial fibrillation with right bundle branch block and nonspecific ST changes.  Past Medical History:  Diagnosis Date  . Atrial fibrillation (HCC)   . Hyperlipidemia   . Hypertension   . Type 2 diabetes mellitus (HCC)     Past Surgical History:  Procedure Laterality Date  . Cataract surgery    . HERNIA REPAIR    . YAG LASER APPLICATION Right 08/17/2016   Procedure: YAG LASER APPLICATION;  Surgeon: Susa Simmondsarroll F Haines, MD;  Location: [facility];  Service: Ophthalmology;  Laterality: Right;    Current Outpatient Medications  Medication Sig Dispense Refill  . amLODipine-valsartan (EXFORGE) 5-320 MG tablet Take 1 tablet by mouth daily.    . Ascorbic Acid (VITAMIN C) 500 MG tablet Take 500 mg by mouth daily.      Marland Kitchen. aspirin EC 81 MG tablet Take 81 mg by mouth daily.    Marland Kitchen. atorvastatin (LIPITOR) 40 MG tablet Take 40 mg by mouth daily.      . chlorthalidone (HYGROTON) 25 MG tablet Take 25 mg by mouth  daily.      . fish oil-omega-3 fatty acids 1000 MG capsule Take 1 g by mouth 2 (two) times daily.     . Glucosamine 500 MG CAPS Take 500 mg by mouth 2 (two) times daily.    . metFORMIN (GLUCOPHAGE) 1000 MG tablet Take 1,500 mg by mouth every morning.     . Multiple Vitamins-Minerals (MULTIVITAMIN WITH MINERALS) tablet Take 1 tablet by mouth daily.      . niacin 250 MG tablet Take 250 mg by mouth daily with breakfast.      . pioglitazone (ACTOS) 45 MG tablet Take 45 mg by mouth Daily.     . sitaGLIPtin (JANUVIA) 100 MG tablet Take 100 mg by mouth daily.    Marland Kitchen. warfarin (COUMADIN) 5 MG tablet Take 2.5-7.5 mg by mouth daily. Managed by Dr. Margaretmary EddyShah's office Monday, Friday, & Saturday 7.5 mg,  Tuesday,Thursday, & Sunday- 5 mg,  Wednesday-2.5 mg     No current facility-administered medications for this visit.    Allergies:  Patient has no known allergies.   Social History: The patient  reports that he has never smoked. He has never used smokeless tobacco. He reports that he does not drink alcohol or use drugs.   ROS:  Please see the history of present illness. Otherwise, complete review of systems is positive for chronic knee and leg pain.  All other  systems are reviewed and negative.   Physical Exam: VS:  BP (!) 148/64   Pulse 61   Ht 5\' 7"  (1.702 m)   Wt 248 lb (112.5 kg)   SpO2 91%   BMI 38.84 kg/m , BMI Body mass index is 38.84 kg/m.  Wt Readings from Last 3 Encounters:  05/20/18 248 lb (112.5 kg)  08/12/17 263 lb 12.8 oz (119.7 kg)  05/15/17 266 lb 6.4 oz (120.8 kg)    General: Obese male, appears comfortable at rest. HEENT: Conjunctiva and lids normal, oropharynx clear. Neck: Supple, no elevated JVP, soft right carotid bruit, no thyromegaly. Lungs: Clear to auscultation, nonlabored breathing at rest. Cardiac: Irregularly irregular, no S3 or significant systolic murmur, no pericardial rub. Abdomen: Obese, nontender, bowel sounds present. Extremities: Trace ankle edema, distal  pulses 2+. Skin: Warm and dry. Musculoskeletal: No kyphosis. Neuropsychiatric: Alert and oriented x3, affect grossly appropriate.  ECG: I personally reviewed the tracing from 05/15/2017 which showed atrial fibrillation with slow ventricular response, low voltage, right bundle branch block.  Other Studies Reviewed Today:  GXT 08/22/2017:  Blood pressure demonstrated a hypertensive response to exercise.  Heavy artifact and inability to reach target heart rate prohibit ability to evaluate for ischemia  Baseline heart rate 47 in afib. With exercise heart rate increased max 111 bpm. Occasional PVCs with exercise including 5 beat run of NSVT.  Carotid Dopplers 01/22/2018: Final Interpretation: Right Carotid: Velocities in the right ICA are consistent with a 40-59%        stenosis.  Left Carotid: Velocities in the left ICA are consistent with a 40-59% stenosis.       The ECA appears >50% stenosed.        Technically challenging study due to vessel depth, neck girth and       cardiac arrhythmia.  Vertebrals: Bilateral vertebral arteries demonstrate antegrade flow.  Assessment and Plan:  1.  Permanent atrial fibrillation with slow ventricular response.  He is asymptomatic and interval GXT was not consistent with chronotropic incompetence.  Continue to avoid AV nodal blockers.  He remains on Coumadin for stroke prophylaxis.  2.  Essential hypertension, continues to follow with Dr. [name].  He is on Exforge and chlorthalidone.  3.  Hyperlipidemia on Lipitor.  Continues to follow with Dr. [name].  4.  Mild to moderate bilateral internal carotid artery disease.  He is a systematic.  Continue aspirin and statin.  Current medicines were reviewed with the patient today.   Orders Placed This Encounter  Procedures  . EKG 12-Lead    Disposition: Follow-up in 6 months.  Signed, Jonelle Sidle, MD, [facility] 05/20/2018 8:59 AM    [facility]  HeartCare at [facility] [address] [place], [place], [place] [address] Phone: [phone]; Fax: [phone]

## 2018-05-20 ENCOUNTER — Encounter: Payer: Self-pay | Admitting: Cardiology

## 2018-05-20 ENCOUNTER — Ambulatory Visit: Payer: Medicare PPO | Admitting: Cardiology

## 2018-05-20 VITALS — BP 148/64 | HR 61 | Ht 67.0 in | Wt 248.0 lb

## 2018-05-20 DIAGNOSIS — I10 Essential (primary) hypertension: Secondary | ICD-10-CM | POA: Diagnosis not present

## 2018-05-20 DIAGNOSIS — I4821 Permanent atrial fibrillation: Secondary | ICD-10-CM

## 2018-05-20 DIAGNOSIS — E782 Mixed hyperlipidemia: Secondary | ICD-10-CM | POA: Diagnosis not present

## 2018-05-20 DIAGNOSIS — I482 Chronic atrial fibrillation: Secondary | ICD-10-CM | POA: Diagnosis not present

## 2018-05-20 DIAGNOSIS — I6523 Occlusion and stenosis of bilateral carotid arteries: Secondary | ICD-10-CM | POA: Diagnosis not present

## 2018-05-20 NOTE — Patient Instructions (Signed)

## 2018-05-22 DIAGNOSIS — E1165 Type 2 diabetes mellitus with hyperglycemia: Secondary | ICD-10-CM | POA: Diagnosis not present

## 2018-05-22 DIAGNOSIS — Z299 Encounter for prophylactic measures, unspecified: Secondary | ICD-10-CM | POA: Diagnosis not present

## 2018-05-22 DIAGNOSIS — I4891 Unspecified atrial fibrillation: Secondary | ICD-10-CM | POA: Diagnosis not present

## 2018-05-22 DIAGNOSIS — I10 Essential (primary) hypertension: Secondary | ICD-10-CM | POA: Diagnosis not present

## 2018-05-22 DIAGNOSIS — Z6841 Body Mass Index (BMI) 40.0 and over, adult: Secondary | ICD-10-CM | POA: Diagnosis not present

## 2018-06-03 DIAGNOSIS — C44319 Basal cell carcinoma of skin of other parts of face: Secondary | ICD-10-CM | POA: Diagnosis not present

## 2018-06-20 DIAGNOSIS — Z299 Encounter for prophylactic measures, unspecified: Secondary | ICD-10-CM | POA: Diagnosis not present

## 2018-06-20 DIAGNOSIS — I10 Essential (primary) hypertension: Secondary | ICD-10-CM | POA: Diagnosis not present

## 2018-06-20 DIAGNOSIS — Z6841 Body Mass Index (BMI) 40.0 and over, adult: Secondary | ICD-10-CM | POA: Diagnosis not present

## 2018-06-20 DIAGNOSIS — I272 Pulmonary hypertension, unspecified: Secondary | ICD-10-CM | POA: Diagnosis not present

## 2018-06-20 DIAGNOSIS — I4891 Unspecified atrial fibrillation: Secondary | ICD-10-CM | POA: Diagnosis not present

## 2018-07-18 DIAGNOSIS — Z6839 Body mass index (BMI) 39.0-39.9, adult: Secondary | ICD-10-CM | POA: Diagnosis not present

## 2018-07-18 DIAGNOSIS — Z299 Encounter for prophylactic measures, unspecified: Secondary | ICD-10-CM | POA: Diagnosis not present

## 2018-07-18 DIAGNOSIS — I4891 Unspecified atrial fibrillation: Secondary | ICD-10-CM | POA: Diagnosis not present

## 2018-07-18 DIAGNOSIS — I10 Essential (primary) hypertension: Secondary | ICD-10-CM | POA: Diagnosis not present

## 2018-07-18 DIAGNOSIS — Z23 Encounter for immunization: Secondary | ICD-10-CM | POA: Diagnosis not present

## 2018-08-15 DIAGNOSIS — Z6839 Body mass index (BMI) 39.0-39.9, adult: Secondary | ICD-10-CM | POA: Diagnosis not present

## 2018-08-15 DIAGNOSIS — I4891 Unspecified atrial fibrillation: Secondary | ICD-10-CM | POA: Diagnosis not present

## 2018-08-15 DIAGNOSIS — Z299 Encounter for prophylactic measures, unspecified: Secondary | ICD-10-CM | POA: Diagnosis not present

## 2018-08-15 DIAGNOSIS — I10 Essential (primary) hypertension: Secondary | ICD-10-CM | POA: Diagnosis not present

## 2018-09-12 DIAGNOSIS — E1165 Type 2 diabetes mellitus with hyperglycemia: Secondary | ICD-10-CM | POA: Diagnosis not present

## 2018-09-12 DIAGNOSIS — I4891 Unspecified atrial fibrillation: Secondary | ICD-10-CM | POA: Diagnosis not present

## 2018-09-12 DIAGNOSIS — Z299 Encounter for prophylactic measures, unspecified: Secondary | ICD-10-CM | POA: Diagnosis not present

## 2018-09-12 DIAGNOSIS — I10 Essential (primary) hypertension: Secondary | ICD-10-CM | POA: Diagnosis not present

## 2018-09-12 DIAGNOSIS — Z6839 Body mass index (BMI) 39.0-39.9, adult: Secondary | ICD-10-CM | POA: Diagnosis not present

## 2018-09-29 DIAGNOSIS — C4441 Basal cell carcinoma of skin of scalp and neck: Secondary | ICD-10-CM | POA: Diagnosis not present

## 2018-09-29 DIAGNOSIS — L2084 Intrinsic (allergic) eczema: Secondary | ICD-10-CM | POA: Diagnosis not present

## 2018-09-29 DIAGNOSIS — D485 Neoplasm of uncertain behavior of skin: Secondary | ICD-10-CM | POA: Diagnosis not present

## 2018-09-29 DIAGNOSIS — L820 Inflamed seborrheic keratosis: Secondary | ICD-10-CM | POA: Diagnosis not present

## 2018-09-29 DIAGNOSIS — Z85828 Personal history of other malignant neoplasm of skin: Secondary | ICD-10-CM | POA: Diagnosis not present

## 2018-09-29 DIAGNOSIS — L570 Actinic keratosis: Secondary | ICD-10-CM | POA: Diagnosis not present

## 2018-09-29 DIAGNOSIS — L728 Other follicular cysts of the skin and subcutaneous tissue: Secondary | ICD-10-CM | POA: Diagnosis not present

## 2018-10-06 DIAGNOSIS — E119 Type 2 diabetes mellitus without complications: Secondary | ICD-10-CM | POA: Diagnosis not present

## 2018-10-06 DIAGNOSIS — H5202 Hypermetropia, left eye: Secondary | ICD-10-CM | POA: Diagnosis not present

## 2018-10-13 DIAGNOSIS — Z299 Encounter for prophylactic measures, unspecified: Secondary | ICD-10-CM | POA: Diagnosis not present

## 2018-10-13 DIAGNOSIS — I10 Essential (primary) hypertension: Secondary | ICD-10-CM | POA: Diagnosis not present

## 2018-10-13 DIAGNOSIS — I4891 Unspecified atrial fibrillation: Secondary | ICD-10-CM | POA: Diagnosis not present

## 2018-10-13 DIAGNOSIS — Z6838 Body mass index (BMI) 38.0-38.9, adult: Secondary | ICD-10-CM | POA: Diagnosis not present

## 2018-11-13 ENCOUNTER — Encounter: Payer: Self-pay | Admitting: *Deleted

## 2018-11-13 DIAGNOSIS — E1165 Type 2 diabetes mellitus with hyperglycemia: Secondary | ICD-10-CM | POA: Diagnosis not present

## 2018-11-13 DIAGNOSIS — Z789 Other specified health status: Secondary | ICD-10-CM | POA: Diagnosis not present

## 2018-11-13 DIAGNOSIS — I10 Essential (primary) hypertension: Secondary | ICD-10-CM | POA: Diagnosis not present

## 2018-11-13 DIAGNOSIS — I4891 Unspecified atrial fibrillation: Secondary | ICD-10-CM | POA: Diagnosis not present

## 2018-11-13 DIAGNOSIS — Z6838 Body mass index (BMI) 38.0-38.9, adult: Secondary | ICD-10-CM | POA: Diagnosis not present

## 2018-11-13 DIAGNOSIS — Z299 Encounter for prophylactic measures, unspecified: Secondary | ICD-10-CM | POA: Diagnosis not present

## 2018-11-17 ENCOUNTER — Encounter: Payer: Self-pay | Admitting: Cardiology

## 2018-11-17 ENCOUNTER — Ambulatory Visit: Payer: Medicare PPO | Admitting: Cardiology

## 2018-11-17 VITALS — BP 151/51 | HR 48 | Ht 67.0 in | Wt 235.6 lb

## 2018-11-17 DIAGNOSIS — I6523 Occlusion and stenosis of bilateral carotid arteries: Secondary | ICD-10-CM | POA: Diagnosis not present

## 2018-11-17 DIAGNOSIS — E782 Mixed hyperlipidemia: Secondary | ICD-10-CM | POA: Diagnosis not present

## 2018-11-17 DIAGNOSIS — I4821 Permanent atrial fibrillation: Secondary | ICD-10-CM

## 2018-11-17 DIAGNOSIS — I10 Essential (primary) hypertension: Secondary | ICD-10-CM | POA: Diagnosis not present

## 2018-11-17 NOTE — Patient Instructions (Signed)

## 2018-11-17 NOTE — Progress Notes (Signed)
Cardiology Office Note  Date: 11/17/2018   ID: Robert Baldwin, Robert Baldwin 1941/03/23, MRN [id number]  PCP: Kirstie Peri, MD  Primary Cardiologist: [name], MD   Chief Complaint  Patient presents with  . Atrial Fibrillation    History of Present Illness: Robert Baldwin is a 78 y.o. male last seen in July 2019.  He presents for a routine follow-up visit.  He does not report any chest pain or syncope, no sense of palpitations.  He did have one episode of lightheadedness and fatigue, but this was related to hypoglycemia.  His diabetic regimen has been adjusted by Dr. [name].  He is on Coumadin with follow-up by Dr. [name].  Reports recent INR of 2.8.  No bleeding problems indicated.  He is bradycardic today but asymptomatic, we have been following him with permanent atrial fibrillation and slow ventricular response.  He is not on any AV nodal blockers.  Past Medical History:  Diagnosis Date  . Atrial fibrillation (HCC)   . Hyperlipidemia   . Hypertension   . Type 2 diabetes mellitus (HCC)     Past Surgical History:  Procedure Laterality Date  . Cataract surgery    . HERNIA REPAIR    . YAG LASER APPLICATION Right 08/17/2016   Procedure: YAG LASER APPLICATION;  Surgeon: Susa Simmonds, MD;  Location: [facility];  Service: Ophthalmology;  Laterality: Right;    Current Outpatient Medications  Medication Sig Dispense Refill  . amLODipine-valsartan (EXFORGE) 5-320 MG tablet Take 1 tablet by mouth daily.    . Ascorbic Acid (VITAMIN C) 500 MG tablet Take 500 mg by mouth daily.      Marland Kitchen aspirin EC 81 MG tablet Take 81 mg by mouth daily.    Marland Kitchen atorvastatin (LIPITOR) 40 MG tablet Take 40 mg by mouth daily.      . chlorthalidone (HYGROTON) 25 MG tablet Take 25 mg by mouth daily.      . fish oil-omega-3 fatty acids 1000 MG capsule Take 1 g by mouth 2 (two) times daily.     Marland Kitchen glipiZIDE (GLUCOTROL) 10 MG tablet Take 10 mg by mouth daily before breakfast.    . Glucosamine 500 MG CAPS Take 500 mg by  mouth 2 (two) times daily.    . metFORMIN (GLUCOPHAGE) 1000 MG tablet Take 1,500 mg by mouth every morning. & 1000 mg in the evening    . Multiple Vitamins-Minerals (MULTIVITAMIN WITH MINERALS) tablet Take 1 tablet by mouth daily.      . niacin 250 MG tablet Take 250 mg by mouth daily with breakfast.      . pioglitazone (ACTOS) 45 MG tablet Take 45 mg by mouth daily.    Marland Kitchen warfarin (COUMADIN) 5 MG tablet Take 2.5-7.5 mg by mouth daily. Managed by Dr. Margaretmary Eddy office Monday, Friday, & Saturday 7.5 mg,  Tuesday,Thursday, & Sunday- 5 mg,  Wednesday-2.5 mg     No current facility-administered medications for this visit.    Allergies:  Patient has no known allergies.   Social History: The patient  reports that he has never smoked. He has never used smokeless tobacco. He reports that he does not drink alcohol or use drugs.   ROS:  Please see the history of present illness. Otherwise, complete review of systems is positive for hearing loss.  All other systems are reviewed and negative.   Physical Exam: VS:  BP (!) 151/51   Pulse (!) 48   Ht 5\' 7"  (1.702 m)   Wt 235  lb 9.6 oz (106.9 kg)   SpO2 96% Comment: on room air  BMI 36.90 kg/m , BMI Body mass index is 36.9 kg/m.  Wt Readings from Last 3 Encounters:  11/17/18 235 lb 9.6 oz (106.9 kg)  05/20/18 248 lb (112.5 kg)  08/12/17 263 lb 12.8 oz (119.7 kg)    General: Obese male, appears comfortable at rest. HEENT: Conjunctiva and lids normal, oropharynx clear. Neck: Supple, no elevated JVP or carotid bruits, no thyromegaly. Lungs: Clear to auscultation, nonlabored breathing at rest. Cardiac: Irregularly irregular, no S3 or significant systolic murmur. Abdomen: Soft, nontender, bowel sounds present. Extremities: Trace ankle edema, distal pulses 2+. Skin: Warm and dry. Musculoskeletal: No kyphosis. Neuropsychiatric: Alert and oriented x3, affect grossly appropriate.  ECG: I personally reviewed the tracing from 05/20/2018 which showed  atrial fibrillation with right bundle branch block and nonspecific ST changes.  Other Studies Reviewed Today:  GXT 08/22/2017:  Blood pressure demonstrated a hypertensive response to exercise.  Heavy artifact and inability to reach target heart rate prohibit ability to evaluate for ischemia  Baseline heart rate 47 in afib. With exercise heart rate increased max 111 bpm. Occasional PVCs with exercise including 5 beat run of NSVT.  Carotid Dopplers 01/22/2018: Final Interpretation: Right Carotid: Velocities in the right ICA are consistent with a 40-59%        stenosis.  Left Carotid: Velocities in the left ICA are consistent with a 40-59% stenosis.       The ECA appears >50% stenosed.        Technically challenging study due to vessel depth, neck girth and       cardiac arrhythmia.  Vertebrals: Bilateral vertebral arteries demonstrate antegrade flow.  Assessment and Plan:  1.  Permanent atrial fibrillation with slow ventricular response.  GXT from 2018 was not consistent with chronotropic incompetence and would continue to follow him.  He is not on AV nodal blockers.  Continues on Coumadin with follow-up per Dr. [name].  2.  Essential hypertension, blood pressure elevated today.  He is trying to lose some weight through diet and continues on medical therapy per Dr. [name].  3.  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  Keep follow-up lab work with Dr. [name].  4.  Asymptomatic, carotid artery disease as outlined above.  He is on statin and low-dose aspirin.  Current medicines were reviewed with the patient today.  Disposition: Follow-up in 6 months.  Signed, Jonelle Sidle, MD, [facility] 11/17/2018 1:25 PM    [facility] at [facility] [address] [place], [place], [place] [address] Phone: [phone]; Fax: [phone]

## 2018-12-09 DIAGNOSIS — D485 Neoplasm of uncertain behavior of skin: Secondary | ICD-10-CM | POA: Diagnosis not present

## 2018-12-09 DIAGNOSIS — C4441 Basal cell carcinoma of skin of scalp and neck: Secondary | ICD-10-CM | POA: Diagnosis not present

## 2018-12-18 DIAGNOSIS — E1165 Type 2 diabetes mellitus with hyperglycemia: Secondary | ICD-10-CM | POA: Diagnosis not present

## 2018-12-18 DIAGNOSIS — Z299 Encounter for prophylactic measures, unspecified: Secondary | ICD-10-CM | POA: Diagnosis not present

## 2018-12-18 DIAGNOSIS — Z6837 Body mass index (BMI) 37.0-37.9, adult: Secondary | ICD-10-CM | POA: Diagnosis not present

## 2018-12-18 DIAGNOSIS — I10 Essential (primary) hypertension: Secondary | ICD-10-CM | POA: Diagnosis not present

## 2018-12-18 DIAGNOSIS — I4891 Unspecified atrial fibrillation: Secondary | ICD-10-CM | POA: Diagnosis not present

## 2019-01-16 DIAGNOSIS — I4891 Unspecified atrial fibrillation: Secondary | ICD-10-CM | POA: Diagnosis not present

## 2019-01-16 DIAGNOSIS — I10 Essential (primary) hypertension: Secondary | ICD-10-CM | POA: Diagnosis not present

## 2019-01-16 DIAGNOSIS — Z6837 Body mass index (BMI) 37.0-37.9, adult: Secondary | ICD-10-CM | POA: Diagnosis not present

## 2019-01-16 DIAGNOSIS — E1165 Type 2 diabetes mellitus with hyperglycemia: Secondary | ICD-10-CM | POA: Diagnosis not present

## 2019-01-16 DIAGNOSIS — I739 Peripheral vascular disease, unspecified: Secondary | ICD-10-CM | POA: Diagnosis not present

## 2019-01-16 DIAGNOSIS — Z299 Encounter for prophylactic measures, unspecified: Secondary | ICD-10-CM | POA: Diagnosis not present

## 2019-02-16 DIAGNOSIS — I739 Peripheral vascular disease, unspecified: Secondary | ICD-10-CM | POA: Diagnosis not present

## 2019-02-16 DIAGNOSIS — I10 Essential (primary) hypertension: Secondary | ICD-10-CM | POA: Diagnosis not present

## 2019-02-16 DIAGNOSIS — Z299 Encounter for prophylactic measures, unspecified: Secondary | ICD-10-CM | POA: Diagnosis not present

## 2019-02-16 DIAGNOSIS — I4891 Unspecified atrial fibrillation: Secondary | ICD-10-CM | POA: Diagnosis not present

## 2019-02-16 DIAGNOSIS — Z6836 Body mass index (BMI) 36.0-36.9, adult: Secondary | ICD-10-CM | POA: Diagnosis not present

## 2019-03-02 DIAGNOSIS — I272 Pulmonary hypertension, unspecified: Secondary | ICD-10-CM | POA: Diagnosis not present

## 2019-03-02 DIAGNOSIS — Z6836 Body mass index (BMI) 36.0-36.9, adult: Secondary | ICD-10-CM | POA: Diagnosis not present

## 2019-03-02 DIAGNOSIS — I10 Essential (primary) hypertension: Secondary | ICD-10-CM | POA: Diagnosis not present

## 2019-03-02 DIAGNOSIS — I4891 Unspecified atrial fibrillation: Secondary | ICD-10-CM | POA: Diagnosis not present

## 2019-03-02 DIAGNOSIS — Z299 Encounter for prophylactic measures, unspecified: Secondary | ICD-10-CM | POA: Diagnosis not present

## 2019-03-31 DIAGNOSIS — Z79899 Other long term (current) drug therapy: Secondary | ICD-10-CM | POA: Diagnosis not present

## 2019-03-31 DIAGNOSIS — Z1331 Encounter for screening for depression: Secondary | ICD-10-CM | POA: Diagnosis not present

## 2019-03-31 DIAGNOSIS — Z6836 Body mass index (BMI) 36.0-36.9, adult: Secondary | ICD-10-CM | POA: Diagnosis not present

## 2019-03-31 DIAGNOSIS — R5383 Other fatigue: Secondary | ICD-10-CM | POA: Diagnosis not present

## 2019-03-31 DIAGNOSIS — Z1211 Encounter for screening for malignant neoplasm of colon: Secondary | ICD-10-CM | POA: Diagnosis not present

## 2019-03-31 DIAGNOSIS — Z7189 Other specified counseling: Secondary | ICD-10-CM | POA: Diagnosis not present

## 2019-03-31 DIAGNOSIS — Z299 Encounter for prophylactic measures, unspecified: Secondary | ICD-10-CM | POA: Diagnosis not present

## 2019-03-31 DIAGNOSIS — Z0000 Encounter for general adult medical examination without abnormal findings: Secondary | ICD-10-CM | POA: Diagnosis not present

## 2019-03-31 DIAGNOSIS — E7800 Pure hypercholesterolemia, unspecified: Secondary | ICD-10-CM | POA: Diagnosis not present

## 2019-03-31 DIAGNOSIS — I4891 Unspecified atrial fibrillation: Secondary | ICD-10-CM | POA: Diagnosis not present

## 2019-03-31 DIAGNOSIS — Z125 Encounter for screening for malignant neoplasm of prostate: Secondary | ICD-10-CM | POA: Diagnosis not present

## 2019-03-31 DIAGNOSIS — Z1339 Encounter for screening examination for other mental health and behavioral disorders: Secondary | ICD-10-CM | POA: Diagnosis not present

## 2019-04-29 DIAGNOSIS — E1165 Type 2 diabetes mellitus with hyperglycemia: Secondary | ICD-10-CM | POA: Diagnosis not present

## 2019-04-29 DIAGNOSIS — I10 Essential (primary) hypertension: Secondary | ICD-10-CM | POA: Diagnosis not present

## 2019-04-29 DIAGNOSIS — I4891 Unspecified atrial fibrillation: Secondary | ICD-10-CM | POA: Diagnosis not present

## 2019-04-29 DIAGNOSIS — Z299 Encounter for prophylactic measures, unspecified: Secondary | ICD-10-CM | POA: Diagnosis not present

## 2019-04-29 DIAGNOSIS — Z6836 Body mass index (BMI) 36.0-36.9, adult: Secondary | ICD-10-CM | POA: Diagnosis not present

## 2019-05-26 ENCOUNTER — Encounter: Payer: Self-pay | Admitting: Cardiology

## 2019-05-26 ENCOUNTER — Other Ambulatory Visit: Payer: Self-pay

## 2019-05-26 ENCOUNTER — Ambulatory Visit: Payer: Medicare PPO | Admitting: Cardiology

## 2019-05-26 VITALS — BP 138/64 | Temp 97.6°F | Ht 67.0 in | Wt 225.0 lb

## 2019-05-26 DIAGNOSIS — I4821 Permanent atrial fibrillation: Secondary | ICD-10-CM | POA: Diagnosis not present

## 2019-05-26 DIAGNOSIS — E782 Mixed hyperlipidemia: Secondary | ICD-10-CM

## 2019-05-26 DIAGNOSIS — I10 Essential (primary) hypertension: Secondary | ICD-10-CM | POA: Diagnosis not present

## 2019-05-26 DIAGNOSIS — R001 Bradycardia, unspecified: Secondary | ICD-10-CM | POA: Diagnosis not present

## 2019-05-26 NOTE — Progress Notes (Signed)
Cardiology Office Note  Date: 05/26/2019   ID: Robert Baldwin, DOB 11/23/1940, MRN [id number]  PCP:  Kirstie PeriShah, Ashish, MD  Cardiologist:  Nona DellSamuel , MD Electrophysiologist:  None   Chief Complaint  Patient presents with  . Cardiac follow-up    History of Present Illness: Robert Baldwin is a 78 y.o. male last seen in January.  He presents for a routine follow-up visit.  Still working full-time with his [employer], mainly focusing on water and sewer.  He does not report any sudden onset dizziness or syncope.  No sense of palpitations.  He continues on Coumadin with follow-up by Dr. Sherryll BurgerShah.  He denies any bleeding problems.  I personally reviewed his ECG today which shows slow atrial fibrillation with right bundle branch block and nonspecific ST-T changes.  Heart rate typically runs in the 40s.  He is not on any AV nodal blockers.  Past Medical History:  Diagnosis Date  . Atrial fibrillation (HCC)   . Hyperlipidemia   . Hypertension   . Type 2 diabetes mellitus (HCC)     Past Surgical History:  Procedure Laterality Date  . Cataract surgery    . HERNIA REPAIR    . YAG LASER APPLICATION Right 08/17/2016   Procedure: YAG LASER APPLICATION;  Surgeon: Susa Simmondsarroll F Haines, MD;  Location: [facility];  Service: Ophthalmology;  Laterality: Right;    Current Outpatient Medications  Medication Sig Dispense Refill  . amLODipine-valsartan (EXFORGE) 5-320 MG tablet Take 1 tablet by mouth daily.    . Ascorbic Acid (VITAMIN C) 500 MG tablet Take 500 mg by mouth daily.      Marland Kitchen. aspirin EC 81 MG tablet Take 81 mg by mouth daily.    Marland Kitchen. atorvastatin (LIPITOR) 40 MG tablet Take 40 mg by mouth daily.      . chlorthalidone (HYGROTON) 25 MG tablet Take 25 mg by mouth daily.      . fish oil-omega-3 fatty acids 1000 MG capsule Take 1 g by mouth 2 (two) times daily.     Marland Kitchen. glipiZIDE (GLUCOTROL) 10 MG tablet Take 10 mg by mouth daily before breakfast.    . Glucosamine 500 MG CAPS Take 500 mg by mouth 2  (two) times daily.    . metFORMIN (GLUCOPHAGE) 1000 MG tablet Take 1,500 mg by mouth every morning. & 1000 mg in the evening    . Multiple Vitamins-Minerals (MULTIVITAMIN WITH MINERALS) tablet Take 1 tablet by mouth daily.      . niacin 250 MG tablet Take 250 mg by mouth daily with breakfast.      . pioglitazone (ACTOS) 45 MG tablet Take 45 mg by mouth daily.    Marland Kitchen. warfarin (COUMADIN) 5 MG tablet Take 2.5-7.5 mg by mouth daily. Managed by Dr. Margaretmary EddyShah's office Monday, Friday, & Saturday 7.5 mg,  Tuesday,Thursday, & Sunday- 5 mg,  Wednesday-2.5 mg     No current facility-administered medications for this visit.    Allergies:  Patient has no known allergies.   Social History: The patient  reports that he has never smoked. He has never used smokeless tobacco. He reports that he does not drink alcohol or use drugs.   ROS:  Please see the history of present illness. Otherwise, complete review of systems is positive for none.  All other systems are reviewed and negative.   Physical Exam: VS:  BP 138/64   Temp 97.6 F (36.4 C)   Ht 5\' 7"  (1.702 m)   Wt 225 lb (102.1 kg)  SpO2 96%   BMI 35.24 kg/m , BMI Body mass index is 35.24 kg/m.  Wt Readings from Last 3 Encounters:  05/26/19 225 lb (102.1 kg)  11/17/18 235 lb 9.6 oz (106.9 kg)  05/20/18 248 lb (112.5 kg)    General: Obese male, appears comfortable at rest. HEENT: Conjunctiva and lids normal, wearing a mask. Neck: Supple, no elevated JVP or carotid bruits, no thyromegaly. Lungs: Clear to auscultation, nonlabored breathing at rest. Cardiac: Irregularly irregular, no S3 or significant systolic murmur, no pericardial rub. Abdomen: Soft, nontender, bowel sounds present, no guarding or rebound. Extremities: Trace ankle edema, distal pulses 2+. Skin: Warm and dry. Musculoskeletal: No kyphosis. Neuropsychiatric: Alert and oriented x3, affect grossly appropriate.  ECG:  An ECG dated 05/20/2018 was personally reviewed today and  demonstrated:  Atrial fibrillation with right bundle branch block and nonspecific ST changes.  Recent Labwork:  No interval lab work available for review.  Other Studies Reviewed Today:  GXT 08/22/2017:  Blood pressure demonstrated a hypertensive response to exercise.  Heavy artifact and inability to reach target heart rate prohibit ability to evaluate for ischemia  Baseline heart rate 47 in afib. With exercise heart rate increased max 111 bpm. Occasional PVCs with exercise including 5 beat run of NSVT.  Carotid Dopplers 01/22/2018: Final Interpretation: Right Carotid: Velocities in the right ICA are consistent with a 40-59% stenosis.  Left Carotid: Velocities in the left ICA are consistent with a 40-59% stenosis. The ECA appears >50% stenosed.  Technically challenging study due to vessel depth, neck girth and cardiac arrhythmia.  Vertebrals: Bilateral vertebral arteries demonstrate antegrade flow.  Assessment and Plan:  1.  Permanent atrial fibrillation with slow ventricular response.  Previous work-up for chronotropic incompetence was unrevealing.  He is not clearly symptomatic and will continue with observation.  No AV nodal blockers.  Otherwise maintain Coumadin with follow-up per Dr. Manuella Ghazi.  2.  Essential hypertension, systolic blood pressure is in the 130s today.  Keep follow-up with Dr. Manuella Ghazi.  3.  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  Medication Adjustments/Labs and Tests Ordered: Current medicines are reviewed at length with the patient today.  Concerns regarding medicines are outlined above.   Tests Ordered: Orders Placed This Encounter  Procedures  . EKG 12-Lead    Medication Changes: No orders of the defined types were placed in this encounter.   Disposition:  Follow up 6 months in the [place] office.  Signed, Satira Sark, MD, [facility] 05/26/2019 9:39 AM    [facility] [place], [place], [place] [address] Phone: [phone]; Fax: [phone]

## 2019-05-26 NOTE — Patient Instructions (Addendum)

## 2019-06-01 DIAGNOSIS — Z6835 Body mass index (BMI) 35.0-35.9, adult: Secondary | ICD-10-CM | POA: Diagnosis not present

## 2019-06-01 DIAGNOSIS — I4891 Unspecified atrial fibrillation: Secondary | ICD-10-CM | POA: Diagnosis not present

## 2019-06-01 DIAGNOSIS — Z713 Dietary counseling and surveillance: Secondary | ICD-10-CM | POA: Diagnosis not present

## 2019-06-01 DIAGNOSIS — Z299 Encounter for prophylactic measures, unspecified: Secondary | ICD-10-CM | POA: Diagnosis not present

## 2019-06-01 DIAGNOSIS — I10 Essential (primary) hypertension: Secondary | ICD-10-CM | POA: Diagnosis not present

## 2019-07-02 DIAGNOSIS — I4891 Unspecified atrial fibrillation: Secondary | ICD-10-CM | POA: Diagnosis not present

## 2019-07-02 DIAGNOSIS — Z299 Encounter for prophylactic measures, unspecified: Secondary | ICD-10-CM | POA: Diagnosis not present

## 2019-07-02 DIAGNOSIS — I10 Essential (primary) hypertension: Secondary | ICD-10-CM | POA: Diagnosis not present

## 2019-07-02 DIAGNOSIS — I739 Peripheral vascular disease, unspecified: Secondary | ICD-10-CM | POA: Diagnosis not present

## 2019-07-02 DIAGNOSIS — Z6835 Body mass index (BMI) 35.0-35.9, adult: Secondary | ICD-10-CM | POA: Diagnosis not present

## 2019-07-24 DIAGNOSIS — Z23 Encounter for immunization: Secondary | ICD-10-CM | POA: Diagnosis not present

## 2019-07-24 DIAGNOSIS — I4891 Unspecified atrial fibrillation: Secondary | ICD-10-CM | POA: Diagnosis not present

## 2019-07-24 DIAGNOSIS — E1165 Type 2 diabetes mellitus with hyperglycemia: Secondary | ICD-10-CM | POA: Diagnosis not present

## 2019-07-24 DIAGNOSIS — Z299 Encounter for prophylactic measures, unspecified: Secondary | ICD-10-CM | POA: Diagnosis not present

## 2019-07-24 DIAGNOSIS — I739 Peripheral vascular disease, unspecified: Secondary | ICD-10-CM | POA: Diagnosis not present

## 2019-07-24 DIAGNOSIS — Z6834 Body mass index (BMI) 34.0-34.9, adult: Secondary | ICD-10-CM | POA: Diagnosis not present

## 2019-08-06 DIAGNOSIS — C44629 Squamous cell carcinoma of skin of left upper limb, including shoulder: Secondary | ICD-10-CM | POA: Diagnosis not present

## 2019-08-06 DIAGNOSIS — L570 Actinic keratosis: Secondary | ICD-10-CM | POA: Diagnosis not present

## 2019-08-06 DIAGNOSIS — Z85828 Personal history of other malignant neoplasm of skin: Secondary | ICD-10-CM | POA: Diagnosis not present

## 2019-08-06 DIAGNOSIS — L723 Sebaceous cyst: Secondary | ICD-10-CM | POA: Diagnosis not present

## 2019-08-06 DIAGNOSIS — L814 Other melanin hyperpigmentation: Secondary | ICD-10-CM | POA: Diagnosis not present

## 2019-08-06 DIAGNOSIS — C44229 Squamous cell carcinoma of skin of left ear and external auricular canal: Secondary | ICD-10-CM | POA: Diagnosis not present

## 2019-08-06 DIAGNOSIS — D225 Melanocytic nevi of trunk: Secondary | ICD-10-CM | POA: Diagnosis not present

## 2019-08-06 DIAGNOSIS — L578 Other skin changes due to chronic exposure to nonionizing radiation: Secondary | ICD-10-CM | POA: Diagnosis not present

## 2019-08-06 DIAGNOSIS — D485 Neoplasm of uncertain behavior of skin: Secondary | ICD-10-CM | POA: Diagnosis not present

## 2019-08-26 DIAGNOSIS — Z299 Encounter for prophylactic measures, unspecified: Secondary | ICD-10-CM | POA: Diagnosis not present

## 2019-08-26 DIAGNOSIS — Z6834 Body mass index (BMI) 34.0-34.9, adult: Secondary | ICD-10-CM | POA: Diagnosis not present

## 2019-08-26 DIAGNOSIS — I10 Essential (primary) hypertension: Secondary | ICD-10-CM | POA: Diagnosis not present

## 2019-08-26 DIAGNOSIS — I739 Peripheral vascular disease, unspecified: Secondary | ICD-10-CM | POA: Diagnosis not present

## 2019-08-26 DIAGNOSIS — I4891 Unspecified atrial fibrillation: Secondary | ICD-10-CM | POA: Diagnosis not present

## 2019-08-26 DIAGNOSIS — E1165 Type 2 diabetes mellitus with hyperglycemia: Secondary | ICD-10-CM | POA: Diagnosis not present

## 2019-09-25 DIAGNOSIS — Z299 Encounter for prophylactic measures, unspecified: Secondary | ICD-10-CM | POA: Diagnosis not present

## 2019-09-25 DIAGNOSIS — Z6834 Body mass index (BMI) 34.0-34.9, adult: Secondary | ICD-10-CM | POA: Diagnosis not present

## 2019-09-25 DIAGNOSIS — I4891 Unspecified atrial fibrillation: Secondary | ICD-10-CM | POA: Diagnosis not present

## 2019-09-25 DIAGNOSIS — I10 Essential (primary) hypertension: Secondary | ICD-10-CM | POA: Diagnosis not present

## 2019-09-25 DIAGNOSIS — Z713 Dietary counseling and surveillance: Secondary | ICD-10-CM | POA: Diagnosis not present

## 2019-10-01 DIAGNOSIS — L905 Scar conditions and fibrosis of skin: Secondary | ICD-10-CM | POA: Diagnosis not present

## 2019-10-01 DIAGNOSIS — C44229 Squamous cell carcinoma of skin of left ear and external auricular canal: Secondary | ICD-10-CM | POA: Diagnosis not present

## 2019-10-01 DIAGNOSIS — C44629 Squamous cell carcinoma of skin of left upper limb, including shoulder: Secondary | ICD-10-CM | POA: Diagnosis not present

## 2019-10-09 DIAGNOSIS — H524 Presbyopia: Secondary | ICD-10-CM | POA: Diagnosis not present

## 2019-10-09 DIAGNOSIS — E119 Type 2 diabetes mellitus without complications: Secondary | ICD-10-CM | POA: Diagnosis not present

## 2019-10-27 DIAGNOSIS — I10 Essential (primary) hypertension: Secondary | ICD-10-CM | POA: Diagnosis not present

## 2019-10-27 DIAGNOSIS — Z6835 Body mass index (BMI) 35.0-35.9, adult: Secondary | ICD-10-CM | POA: Diagnosis not present

## 2019-10-27 DIAGNOSIS — Z299 Encounter for prophylactic measures, unspecified: Secondary | ICD-10-CM | POA: Diagnosis not present

## 2019-10-27 DIAGNOSIS — I272 Pulmonary hypertension, unspecified: Secondary | ICD-10-CM | POA: Diagnosis not present

## 2019-10-27 DIAGNOSIS — I4891 Unspecified atrial fibrillation: Secondary | ICD-10-CM | POA: Diagnosis not present

## 2019-10-27 DIAGNOSIS — Z789 Other specified health status: Secondary | ICD-10-CM | POA: Diagnosis not present

## 2019-10-27 DIAGNOSIS — E1165 Type 2 diabetes mellitus with hyperglycemia: Secondary | ICD-10-CM | POA: Diagnosis not present

## 2019-12-03 DIAGNOSIS — E1165 Type 2 diabetes mellitus with hyperglycemia: Secondary | ICD-10-CM | POA: Diagnosis not present

## 2019-12-03 DIAGNOSIS — I10 Essential (primary) hypertension: Secondary | ICD-10-CM | POA: Diagnosis not present

## 2019-12-03 DIAGNOSIS — I4891 Unspecified atrial fibrillation: Secondary | ICD-10-CM | POA: Diagnosis not present

## 2019-12-03 DIAGNOSIS — Z299 Encounter for prophylactic measures, unspecified: Secondary | ICD-10-CM | POA: Diagnosis not present

## 2019-12-03 DIAGNOSIS — Z6836 Body mass index (BMI) 36.0-36.9, adult: Secondary | ICD-10-CM | POA: Diagnosis not present

## 2019-12-31 DIAGNOSIS — Z6836 Body mass index (BMI) 36.0-36.9, adult: Secondary | ICD-10-CM | POA: Diagnosis not present

## 2019-12-31 DIAGNOSIS — I10 Essential (primary) hypertension: Secondary | ICD-10-CM | POA: Diagnosis not present

## 2019-12-31 DIAGNOSIS — Z299 Encounter for prophylactic measures, unspecified: Secondary | ICD-10-CM | POA: Diagnosis not present

## 2019-12-31 DIAGNOSIS — I4891 Unspecified atrial fibrillation: Secondary | ICD-10-CM | POA: Diagnosis not present

## 2019-12-31 DIAGNOSIS — E1165 Type 2 diabetes mellitus with hyperglycemia: Secondary | ICD-10-CM | POA: Diagnosis not present

## 2020-01-20 DIAGNOSIS — K8020 Calculus of gallbladder without cholecystitis without obstruction: Secondary | ICD-10-CM | POA: Diagnosis not present

## 2020-01-20 DIAGNOSIS — R001 Bradycardia, unspecified: Secondary | ICD-10-CM | POA: Diagnosis not present

## 2020-01-28 DIAGNOSIS — Z789 Other specified health status: Secondary | ICD-10-CM | POA: Diagnosis not present

## 2020-01-28 DIAGNOSIS — Z299 Encounter for prophylactic measures, unspecified: Secondary | ICD-10-CM | POA: Diagnosis not present

## 2020-01-28 DIAGNOSIS — I4891 Unspecified atrial fibrillation: Secondary | ICD-10-CM | POA: Diagnosis not present

## 2020-01-28 DIAGNOSIS — I10 Essential (primary) hypertension: Secondary | ICD-10-CM | POA: Diagnosis not present

## 2020-01-28 DIAGNOSIS — Z6836 Body mass index (BMI) 36.0-36.9, adult: Secondary | ICD-10-CM | POA: Diagnosis not present

## 2020-02-11 DIAGNOSIS — I10 Essential (primary) hypertension: Secondary | ICD-10-CM | POA: Diagnosis not present

## 2020-02-11 DIAGNOSIS — Z299 Encounter for prophylactic measures, unspecified: Secondary | ICD-10-CM | POA: Diagnosis not present

## 2020-02-11 DIAGNOSIS — Z6836 Body mass index (BMI) 36.0-36.9, adult: Secondary | ICD-10-CM | POA: Diagnosis not present

## 2020-02-11 DIAGNOSIS — I4891 Unspecified atrial fibrillation: Secondary | ICD-10-CM | POA: Diagnosis not present

## 2020-02-29 DIAGNOSIS — Z85828 Personal history of other malignant neoplasm of skin: Secondary | ICD-10-CM | POA: Diagnosis not present

## 2020-02-29 DIAGNOSIS — L249 Irritant contact dermatitis, unspecified cause: Secondary | ICD-10-CM | POA: Diagnosis not present

## 2020-02-29 DIAGNOSIS — D485 Neoplasm of uncertain behavior of skin: Secondary | ICD-10-CM | POA: Diagnosis not present

## 2020-02-29 DIAGNOSIS — D0462 Carcinoma in situ of skin of left upper limb, including shoulder: Secondary | ICD-10-CM | POA: Diagnosis not present

## 2020-02-29 DIAGNOSIS — L570 Actinic keratosis: Secondary | ICD-10-CM | POA: Diagnosis not present

## 2020-02-29 DIAGNOSIS — L905 Scar conditions and fibrosis of skin: Secondary | ICD-10-CM | POA: Diagnosis not present

## 2020-03-15 DIAGNOSIS — Z299 Encounter for prophylactic measures, unspecified: Secondary | ICD-10-CM | POA: Diagnosis not present

## 2020-03-15 DIAGNOSIS — I10 Essential (primary) hypertension: Secondary | ICD-10-CM | POA: Diagnosis not present

## 2020-03-15 DIAGNOSIS — Z6835 Body mass index (BMI) 35.0-35.9, adult: Secondary | ICD-10-CM | POA: Diagnosis not present

## 2020-03-15 DIAGNOSIS — I4891 Unspecified atrial fibrillation: Secondary | ICD-10-CM | POA: Diagnosis not present

## 2020-03-23 ENCOUNTER — Ambulatory Visit: Payer: Medicare PPO | Admitting: Cardiology

## 2020-03-23 ENCOUNTER — Telehealth: Payer: Self-pay | Admitting: Cardiology

## 2020-03-23 ENCOUNTER — Encounter: Payer: Self-pay | Admitting: Cardiology

## 2020-03-23 ENCOUNTER — Encounter: Payer: Self-pay | Admitting: *Deleted

## 2020-03-23 ENCOUNTER — Other Ambulatory Visit: Payer: Self-pay

## 2020-03-23 VITALS — BP 124/62 | HR 44 | Ht 67.0 in | Wt 212.4 lb

## 2020-03-23 DIAGNOSIS — R001 Bradycardia, unspecified: Secondary | ICD-10-CM

## 2020-03-23 DIAGNOSIS — I6523 Occlusion and stenosis of bilateral carotid arteries: Secondary | ICD-10-CM

## 2020-03-23 DIAGNOSIS — I4821 Permanent atrial fibrillation: Secondary | ICD-10-CM

## 2020-03-23 NOTE — Progress Notes (Signed)
Cardiology Office Note  Date: 03/23/2020   ID: Robert Baldwin, Robert Baldwin 09/21/1941, MRN [id number]  PCP:  Kirstie Peri, MD  Cardiologist:  [name], MD Electrophysiologist:  None   Chief Complaint  Patient presents with  . Cardiac follow-up    History of Present Illness: Robert Baldwin is a 79 y.o. male last seen in August 2020.  He presents for a follow-up visit.  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  He does not report any sudden dizziness or syncope, no exertional chest pain.  He remains on Coumadin with follow-up by Dr. [name].  He does not report any bleeding problems, recalls his last INR to be 2.1.  He is due for follow-up carotid Dopplers.  We discussed getting this set up.  His last evaluation in 2019 revealed moderate bilateral ICA stenoses.  He is on aspirin and Lipitor.  I reviewed the remainder of his medications which are outlined below.  Past Medical History:  Diagnosis Date  . Atrial fibrillation (HCC)   . Hyperlipidemia   . Hypertension   . Type 2 diabetes mellitus (HCC)     Past Surgical History:  Procedure Laterality Date  . Cataract surgery    . HERNIA REPAIR    . YAG LASER APPLICATION Right 08/17/2016   Procedure: YAG LASER APPLICATION;  Surgeon: Susa Simmonds, MD;  Location: [facility];  Service: Ophthalmology;  Laterality: Right;    Current Outpatient Medications  Medication Sig Dispense Refill  . amLODipine-valsartan (EXFORGE) 5-320 MG tablet Take 1 tablet by mouth daily.    . Ascorbic Acid (VITAMIN C) 500 MG tablet Take 500 mg by mouth daily.      Marland Kitchen aspirin EC 81 MG tablet Take 81 mg by mouth daily.    Marland Kitchen atorvastatin (LIPITOR) 40 MG tablet Take 40 mg by mouth daily.      . chlorthalidone (HYGROTON) 25 MG tablet Take 25 mg by mouth daily.      . fish oil-omega-3 fatty acids 1000 MG capsule Take 1 g by mouth 2 (two) times daily.     Marland Kitchen glipiZIDE (GLUCOTROL) 10 MG tablet Take 10 mg by mouth daily before breakfast.    . Glucosamine 500  MG CAPS Take 500 mg by mouth 2 (two) times daily.    . metFORMIN (GLUCOPHAGE) 1000 MG tablet Take 1,500 mg by mouth every morning. & 1000 mg in the evening    . Multiple Vitamins-Minerals (MULTIVITAMIN WITH MINERALS) tablet Take 1 tablet by mouth daily.      . niacin 250 MG tablet Take 250 mg by mouth daily with breakfast.      . pioglitazone (ACTOS) 45 MG tablet Take 45 mg by mouth daily.    Marland Kitchen warfarin (COUMADIN) 5 MG tablet Take 2.5-7.5 mg by mouth daily. Managed by Dr. Margaretmary Eddy office Monday, Friday, & Saturday 7.5 mg,  Tuesday,Thursday, & Sunday- 5 mg,  Wednesday-2.5 mg     No current facility-administered medications for this visit.   Allergies:  Patient has no known allergies.   ROS:  No dizziness or syncope.  Physical Exam: VS:  BP 124/62   Pulse (!) 44   Ht 5\' 7"  (1.702 m)   Wt 212 lb 6.4 oz (96.3 kg)   BMI 33.27 kg/m , BMI Body mass index is 33.27 kg/m.  Wt Readings from Last 3 Encounters:  03/23/20 212 lb 6.4 oz (96.3 kg)  05/26/19 225 lb (102.1 kg)  11/17/18 235 lb 9.6 oz (106.9 kg)  General:  Elderly male, appears comfortable at rest. HEENT: Conjunctiva and lids normal, wearing a mask. Neck: Supple, no elevated JVP or carotid bruits, no thyromegaly. Lungs: Clear to auscultation, nonlabored breathing at rest. Cardiac: Irregularly irregular, no S3 or significant systolic murmur, no pericardial rub. Extremities: Trace ankle edema, distal pulses 2+.  ECG:  An ECG dated 05/26/2019 was personally reviewed today and demonstrated:  Slow atrial fibrillation with right bundle branch block and nonspecific ST-T changes.  Recent Labwork:  No interval lab work available for review today.  Other Studies Reviewed Today:  GXT 08/22/2017:  Blood pressure demonstrated a hypertensive response to exercise.  Heavy artifact and inability to reach target heart rate prohibit ability to evaluate for ischemia  Baseline heart rate 47 in afib. With exercise heart rate increased max 111  bpm. Occasional PVCs with exercise including 5 beat run of NSVT.  Carotid Dopplers 01/22/2018: Final Interpretation: Right Carotid: Velocities in the right ICA are consistent with a 40-59% stenosis.  Left Carotid: Velocities in the left ICA are consistent with a 40-59% stenosis. The ECA appears >50% stenosed.  Technically challenging study due to vessel depth, neck girth and cardiac arrhythmia.  Vertebrals: Bilateral vertebral arteries demonstrate antegrade flow.  Assessment and Plan:  1.  Permanent atrial fibrillation, CHA2DS2-VASc score is 5.  He remains on Coumadin with follow-up by Dr. Manuella Ghazi, no reported bleeding problems.  He does not require any AV nodal blockers for heart rate control with resting bradycardia.  2.  Bradycardia, slow ventricular response in atrial fibrillation.  Although heart rate is in the low 40s, he is not clearly symptomatic and did not have definitive evidence of chronotropic incompetence by GXT in 2018. We continue cautious observation.  I have talked to him about the possibility of a pacemaker if the situation worsens.  3.  Moderate bilateral ICA stenosis, due for follow-up carotid Dopplers which will be arranged.  Continue aspirin and statin.  Medication Adjustments/Labs and Tests Ordered: Current medicines are reviewed at length with the patient today.  Concerns regarding medicines are outlined above.   Tests Ordered: Orders Placed This Encounter  Procedures  . EKG 12-Lead  . VAS US CAROTID    Medication Changes: No orders of the defined types were placed in this encounter.   Disposition:  Follow up 6 months in the [place] office.  Signed, Satira Sark, MD, [facility] 03/23/2020 8:40 AM    [facility] [place], [place], [place] [address] Phone: [phone]; Fax: [phone]

## 2020-03-23 NOTE — Patient Instructions (Addendum)
Medication Instructions:   Your physician recommends that you continue on your current medications as directed. Please refer to the Current Medication list given to you today.  Labwork:  NONE  Testing/Procedures: Your physician has requested that you have a carotid duplex. This test is an ultrasound of the carotid arteries in your neck. It looks at blood flow through these arteries that supply the brain with blood. Allow one hour for this exam. There are no restrictions or special instructions.  Follow-Up:  Your physician recommends that you schedule a follow-up appointment in: 6 months (office).  Any Other Special Instructions Will Be Listed Below (If Applicable).  If you need a refill on your cardiac medications before your next appointment, please call your pharmacy. 

## 2020-03-23 NOTE — Telephone Encounter (Signed)
  Precert needed for: Carotid  Location: [facility]   Date: April 12, 2020

## 2020-04-04 DIAGNOSIS — Z1339 Encounter for screening examination for other mental health and behavioral disorders: Secondary | ICD-10-CM | POA: Diagnosis not present

## 2020-04-04 DIAGNOSIS — Z7189 Other specified counseling: Secondary | ICD-10-CM | POA: Diagnosis not present

## 2020-04-04 DIAGNOSIS — Z1331 Encounter for screening for depression: Secondary | ICD-10-CM | POA: Diagnosis not present

## 2020-04-04 DIAGNOSIS — R5383 Other fatigue: Secondary | ICD-10-CM | POA: Diagnosis not present

## 2020-04-04 DIAGNOSIS — E1165 Type 2 diabetes mellitus with hyperglycemia: Secondary | ICD-10-CM | POA: Diagnosis not present

## 2020-04-04 DIAGNOSIS — Z0000 Encounter for general adult medical examination without abnormal findings: Secondary | ICD-10-CM | POA: Diagnosis not present

## 2020-04-04 DIAGNOSIS — Z6835 Body mass index (BMI) 35.0-35.9, adult: Secondary | ICD-10-CM | POA: Diagnosis not present

## 2020-04-04 DIAGNOSIS — Z1211 Encounter for screening for malignant neoplasm of colon: Secondary | ICD-10-CM | POA: Diagnosis not present

## 2020-04-05 DIAGNOSIS — R5383 Other fatigue: Secondary | ICD-10-CM | POA: Diagnosis not present

## 2020-04-05 DIAGNOSIS — Z125 Encounter for screening for malignant neoplasm of prostate: Secondary | ICD-10-CM | POA: Diagnosis not present

## 2020-04-05 DIAGNOSIS — Z79899 Other long term (current) drug therapy: Secondary | ICD-10-CM | POA: Diagnosis not present

## 2020-04-05 DIAGNOSIS — E7800 Pure hypercholesterolemia, unspecified: Secondary | ICD-10-CM | POA: Diagnosis not present

## 2020-04-12 ENCOUNTER — Other Ambulatory Visit: Payer: Self-pay

## 2020-04-12 ENCOUNTER — Other Ambulatory Visit: Payer: Self-pay | Admitting: Cardiology

## 2020-04-12 ENCOUNTER — Ambulatory Visit (INDEPENDENT_AMBULATORY_CARE_PROVIDER_SITE_OTHER): Payer: Medicare PPO

## 2020-04-12 DIAGNOSIS — I6523 Occlusion and stenosis of bilateral carotid arteries: Secondary | ICD-10-CM

## 2020-04-14 DIAGNOSIS — D0462 Carcinoma in situ of skin of left upper limb, including shoulder: Secondary | ICD-10-CM | POA: Diagnosis not present

## 2020-04-14 DIAGNOSIS — C44629 Squamous cell carcinoma of skin of left upper limb, including shoulder: Secondary | ICD-10-CM | POA: Diagnosis not present

## 2020-04-15 ENCOUNTER — Telehealth: Payer: Self-pay | Admitting: *Deleted

## 2020-04-15 DIAGNOSIS — I6523 Occlusion and stenosis of bilateral carotid arteries: Secondary | ICD-10-CM

## 2020-04-15 DIAGNOSIS — I4891 Unspecified atrial fibrillation: Secondary | ICD-10-CM | POA: Diagnosis not present

## 2020-04-15 DIAGNOSIS — Z6835 Body mass index (BMI) 35.0-35.9, adult: Secondary | ICD-10-CM | POA: Diagnosis not present

## 2020-04-15 DIAGNOSIS — Z713 Dietary counseling and surveillance: Secondary | ICD-10-CM | POA: Diagnosis not present

## 2020-04-15 DIAGNOSIS — Z299 Encounter for prophylactic measures, unspecified: Secondary | ICD-10-CM | POA: Diagnosis not present

## 2020-04-15 DIAGNOSIS — I10 Essential (primary) hypertension: Secondary | ICD-10-CM | POA: Diagnosis not present

## 2020-04-15 NOTE — Telephone Encounter (Signed)
-----   Message from Jonelle Sidle, MD sent at 04/13/2020  2:11 PM EDT ----- Results reviewed.  Stable, moderate bilateral ICA stenoses.  Follow-up carotid Dopplers in 1 year.

## 2020-04-19 NOTE — Telephone Encounter (Signed)
Patient's wife informed. Copy sent to PCP 

## 2020-04-20 DIAGNOSIS — E119 Type 2 diabetes mellitus without complications: Secondary | ICD-10-CM | POA: Diagnosis not present

## 2020-04-20 DIAGNOSIS — I10 Essential (primary) hypertension: Secondary | ICD-10-CM | POA: Diagnosis not present

## 2020-05-13 DIAGNOSIS — Z6834 Body mass index (BMI) 34.0-34.9, adult: Secondary | ICD-10-CM | POA: Diagnosis not present

## 2020-05-13 DIAGNOSIS — I4891 Unspecified atrial fibrillation: Secondary | ICD-10-CM | POA: Diagnosis not present

## 2020-05-13 DIAGNOSIS — Z299 Encounter for prophylactic measures, unspecified: Secondary | ICD-10-CM | POA: Diagnosis not present

## 2020-05-13 DIAGNOSIS — Z713 Dietary counseling and surveillance: Secondary | ICD-10-CM | POA: Diagnosis not present

## 2020-05-13 DIAGNOSIS — I10 Essential (primary) hypertension: Secondary | ICD-10-CM | POA: Diagnosis not present

## 2020-05-31 DIAGNOSIS — L570 Actinic keratosis: Secondary | ICD-10-CM | POA: Diagnosis not present

## 2020-05-31 DIAGNOSIS — Z85828 Personal history of other malignant neoplasm of skin: Secondary | ICD-10-CM | POA: Diagnosis not present

## 2020-05-31 DIAGNOSIS — C44319 Basal cell carcinoma of skin of other parts of face: Secondary | ICD-10-CM | POA: Diagnosis not present

## 2020-05-31 DIAGNOSIS — D225 Melanocytic nevi of trunk: Secondary | ICD-10-CM | POA: Diagnosis not present

## 2020-05-31 DIAGNOSIS — D485 Neoplasm of uncertain behavior of skin: Secondary | ICD-10-CM | POA: Diagnosis not present

## 2020-06-09 DIAGNOSIS — Z6834 Body mass index (BMI) 34.0-34.9, adult: Secondary | ICD-10-CM | POA: Diagnosis not present

## 2020-06-09 DIAGNOSIS — Z299 Encounter for prophylactic measures, unspecified: Secondary | ICD-10-CM | POA: Diagnosis not present

## 2020-06-09 DIAGNOSIS — Z713 Dietary counseling and surveillance: Secondary | ICD-10-CM | POA: Diagnosis not present

## 2020-06-09 DIAGNOSIS — I10 Essential (primary) hypertension: Secondary | ICD-10-CM | POA: Diagnosis not present

## 2020-06-09 DIAGNOSIS — I4891 Unspecified atrial fibrillation: Secondary | ICD-10-CM | POA: Diagnosis not present

## 2020-07-08 DIAGNOSIS — Z6834 Body mass index (BMI) 34.0-34.9, adult: Secondary | ICD-10-CM | POA: Diagnosis not present

## 2020-07-08 DIAGNOSIS — I10 Essential (primary) hypertension: Secondary | ICD-10-CM | POA: Diagnosis not present

## 2020-07-08 DIAGNOSIS — I4891 Unspecified atrial fibrillation: Secondary | ICD-10-CM | POA: Diagnosis not present

## 2020-07-08 DIAGNOSIS — E1151 Type 2 diabetes mellitus with diabetic peripheral angiopathy without gangrene: Secondary | ICD-10-CM | POA: Diagnosis not present

## 2020-07-08 DIAGNOSIS — E1165 Type 2 diabetes mellitus with hyperglycemia: Secondary | ICD-10-CM | POA: Diagnosis not present

## 2020-07-08 DIAGNOSIS — Z299 Encounter for prophylactic measures, unspecified: Secondary | ICD-10-CM | POA: Diagnosis not present

## 2020-07-21 DIAGNOSIS — I10 Essential (primary) hypertension: Secondary | ICD-10-CM | POA: Diagnosis not present

## 2020-07-21 DIAGNOSIS — E119 Type 2 diabetes mellitus without complications: Secondary | ICD-10-CM | POA: Diagnosis not present

## 2020-07-28 DIAGNOSIS — C4441 Basal cell carcinoma of skin of scalp and neck: Secondary | ICD-10-CM | POA: Diagnosis not present

## 2020-08-05 DIAGNOSIS — Z713 Dietary counseling and surveillance: Secondary | ICD-10-CM | POA: Diagnosis not present

## 2020-08-05 DIAGNOSIS — Z6834 Body mass index (BMI) 34.0-34.9, adult: Secondary | ICD-10-CM | POA: Diagnosis not present

## 2020-08-05 DIAGNOSIS — I4891 Unspecified atrial fibrillation: Secondary | ICD-10-CM | POA: Diagnosis not present

## 2020-08-05 DIAGNOSIS — Z299 Encounter for prophylactic measures, unspecified: Secondary | ICD-10-CM | POA: Diagnosis not present

## 2020-08-05 DIAGNOSIS — I10 Essential (primary) hypertension: Secondary | ICD-10-CM | POA: Diagnosis not present

## 2020-09-05 DIAGNOSIS — Z6835 Body mass index (BMI) 35.0-35.9, adult: Secondary | ICD-10-CM | POA: Diagnosis not present

## 2020-09-05 DIAGNOSIS — Z299 Encounter for prophylactic measures, unspecified: Secondary | ICD-10-CM | POA: Diagnosis not present

## 2020-09-05 DIAGNOSIS — I10 Essential (primary) hypertension: Secondary | ICD-10-CM | POA: Diagnosis not present

## 2020-09-05 DIAGNOSIS — Z713 Dietary counseling and surveillance: Secondary | ICD-10-CM | POA: Diagnosis not present

## 2020-09-05 DIAGNOSIS — I4891 Unspecified atrial fibrillation: Secondary | ICD-10-CM | POA: Diagnosis not present

## 2020-09-20 DIAGNOSIS — I10 Essential (primary) hypertension: Secondary | ICD-10-CM | POA: Diagnosis not present

## 2020-09-20 DIAGNOSIS — E119 Type 2 diabetes mellitus without complications: Secondary | ICD-10-CM | POA: Diagnosis not present

## 2020-09-30 ENCOUNTER — Ambulatory Visit: Payer: Medicare PPO | Admitting: Cardiology

## 2020-09-30 DIAGNOSIS — L853 Xerosis cutis: Secondary | ICD-10-CM | POA: Diagnosis not present

## 2020-09-30 DIAGNOSIS — Z85828 Personal history of other malignant neoplasm of skin: Secondary | ICD-10-CM | POA: Diagnosis not present

## 2020-09-30 DIAGNOSIS — C44329 Squamous cell carcinoma of skin of other parts of face: Secondary | ICD-10-CM | POA: Diagnosis not present

## 2020-09-30 DIAGNOSIS — D225 Melanocytic nevi of trunk: Secondary | ICD-10-CM | POA: Diagnosis not present

## 2020-09-30 DIAGNOSIS — L570 Actinic keratosis: Secondary | ICD-10-CM | POA: Diagnosis not present

## 2020-09-30 DIAGNOSIS — L905 Scar conditions and fibrosis of skin: Secondary | ICD-10-CM | POA: Diagnosis not present

## 2020-09-30 DIAGNOSIS — D485 Neoplasm of uncertain behavior of skin: Secondary | ICD-10-CM | POA: Diagnosis not present

## 2020-09-30 DIAGNOSIS — L309 Dermatitis, unspecified: Secondary | ICD-10-CM | POA: Diagnosis not present

## 2020-10-05 DIAGNOSIS — I272 Pulmonary hypertension, unspecified: Secondary | ICD-10-CM | POA: Diagnosis not present

## 2020-10-05 DIAGNOSIS — Z299 Encounter for prophylactic measures, unspecified: Secondary | ICD-10-CM | POA: Diagnosis not present

## 2020-10-05 DIAGNOSIS — I10 Essential (primary) hypertension: Secondary | ICD-10-CM | POA: Diagnosis not present

## 2020-10-05 DIAGNOSIS — I4891 Unspecified atrial fibrillation: Secondary | ICD-10-CM | POA: Diagnosis not present

## 2020-10-05 DIAGNOSIS — Z6835 Body mass index (BMI) 35.0-35.9, adult: Secondary | ICD-10-CM | POA: Diagnosis not present

## 2020-10-10 DIAGNOSIS — E113293 Type 2 diabetes mellitus with mild nonproliferative diabetic retinopathy without macular edema, bilateral: Secondary | ICD-10-CM | POA: Diagnosis not present

## 2020-10-10 DIAGNOSIS — H524 Presbyopia: Secondary | ICD-10-CM | POA: Diagnosis not present

## 2020-11-03 DIAGNOSIS — D0439 Carcinoma in situ of skin of other parts of face: Secondary | ICD-10-CM | POA: Diagnosis not present

## 2020-11-04 DIAGNOSIS — E1165 Type 2 diabetes mellitus with hyperglycemia: Secondary | ICD-10-CM | POA: Diagnosis not present

## 2020-11-04 DIAGNOSIS — I4891 Unspecified atrial fibrillation: Secondary | ICD-10-CM | POA: Diagnosis not present

## 2020-11-04 DIAGNOSIS — Z6834 Body mass index (BMI) 34.0-34.9, adult: Secondary | ICD-10-CM | POA: Diagnosis not present

## 2020-11-04 DIAGNOSIS — Z299 Encounter for prophylactic measures, unspecified: Secondary | ICD-10-CM | POA: Diagnosis not present

## 2020-11-04 DIAGNOSIS — I10 Essential (primary) hypertension: Secondary | ICD-10-CM | POA: Diagnosis not present

## 2020-11-04 DIAGNOSIS — Z789 Other specified health status: Secondary | ICD-10-CM | POA: Diagnosis not present

## 2020-11-04 DIAGNOSIS — I779 Disorder of arteries and arterioles, unspecified: Secondary | ICD-10-CM | POA: Diagnosis not present

## 2020-11-14 NOTE — Progress Notes (Signed)
Cardiology Office Note  Date: 11/15/2020   ID: Robert Baldwin, Robert Baldwin 29-Aug-1941, MRN [id number]  PCP:  Kirstie Peri, MD  Cardiologist:  [name], MD Electrophysiologist:  None   Chief Complaint  Patient presents with  . Cardiac follow-up    History of Present Illness: Robert Baldwin is a 80 y.o. male last seen in June 2021.  He presents for a routine visit.  Reports no change in stamina, no dizziness or syncope.  He continues to work full-time with his [employer].  He continues on Coumadin with follow-up by Dr. [name].  No spontaneous bleeding problems reported.  I reviewed his medications which are outlined below.  Follow-up carotid Dopplers in June 2021 revealed stable, moderate bilateral ICA stenoses.  Past Medical History:  Diagnosis Date  . Atrial fibrillation (HCC)   . Hyperlipidemia   . Hypertension   . Type 2 diabetes mellitus (HCC)     Past Surgical History:  Procedure Laterality Date  . Cataract surgery    . HERNIA REPAIR    . YAG LASER APPLICATION Right 08/17/2016   Procedure: YAG LASER APPLICATION;  Surgeon: Susa Simmonds, MD;  Location: [facility];  Service: Ophthalmology;  Laterality: Right;    Current Outpatient Medications  Medication Sig Dispense Refill  . amLODipine-valsartan (EXFORGE) 5-320 MG tablet Take 1 tablet by mouth daily.    . Ascorbic Acid (VITAMIN C) 500 MG tablet Take 500 mg by mouth daily.      Marland Kitchen aspirin EC 81 MG tablet Take 81 mg by mouth daily.    Marland Kitchen atorvastatin (LIPITOR) 40 MG tablet Take 40 mg by mouth daily.      . chlorthalidone (HYGROTON) 25 MG tablet Take 25 mg by mouth daily.      . fish oil-omega-3 fatty acids 1000 MG capsule Take 1 g by mouth 2 (two) times daily.    Marland Kitchen glipiZIDE (GLUCOTROL) 10 MG tablet Take 10 mg by mouth daily before breakfast.    . Glucosamine 500 MG CAPS Take 500 mg by mouth 2 (two) times daily.    . metFORMIN (GLUCOPHAGE) 1000 MG tablet Take 1,500 mg by mouth every morning. & 1000 mg in the evening     . Multiple Vitamins-Minerals (MULTIVITAMIN WITH MINERALS) tablet Take 1 tablet by mouth daily.    . niacin 250 MG tablet Take 250 mg by mouth daily with breakfast.    . pioglitazone (ACTOS) 45 MG tablet Take 45 mg by mouth daily.    Marland Kitchen warfarin (COUMADIN) 5 MG tablet Take 2.5-7.5 mg by mouth daily. Managed by Dr. Margaretmary Eddy office Monday, Friday, & Saturday 7.5 mg,  Tuesday,Thursday, & Sunday- 5 mg,  Wednesday-2.5 mg     No current facility-administered medications for this visit.   Allergies:  Patient has no known allergies.   ROS: No orthopnea or PND.  Physical Exam: VS:  BP 112/62   Pulse (!) 56   Ht 5\' 7"  (1.702 m)   Wt 202 lb (91.6 kg)   SpO2 92%   BMI 31.64 kg/m , BMI Body mass index is 31.64 kg/m.  Wt Readings from Last 3 Encounters:  11/15/20 202 lb (91.6 kg)  03/23/20 212 lb 6.4 oz (96.3 kg)  05/26/19 225 lb (102.1 kg)    General: Elderly male, appears comfortable at rest. HEENT: Conjunctiva and lids normal, wearing a mask. Neck: Supple, no elevated JVP or carotid bruits, no thyromegaly. Lungs: Clear to auscultation, nonlabored breathing at rest. Cardiac: Irregularly irregular, no S3 or  significant systolic murmur, no pericardial rub. Extremities: No pitting edema.  ECG:  An ECG dated 03/23/2020 was personally reviewed today and demonstrated:  Slow atrial fibrillation with right bundle branch block and rightward axis.  Recent Labwork:  June 2021: Hemoglobin 12.0, platelets 170, BUN 23, creatinine 0.98, potassium 4.5, AST 25, ALT 19, cholesterol 111, triglycerides 40, HDL 49, LDL 51, TSH 1.16  Other Studies Reviewed Today:  GXT 08/22/2017:  Blood pressure demonstrated a hypertensive response to exercise.  Heavy artifact and inability to reach target heart rate prohibit ability to evaluate for ischemia  Baseline heart rate 47 in afib. With exercise heart rate increased max 111 bpm. Occasional PVCs with exercise including 5 beat run of NSVT.  Carotid Dopplers  04/12/2020: Summary:  Right Carotid: Velocities in the right ICA are consistent with a 40-59%         stenosis. Cannot rule out higher stenosis due to  circumferencial         shadowing plaque.   Left Carotid: Velocities in the left ICA are consistent with a 40-59%  stenosis.        The ECA appears >50% stenosed. Cannot rule out higher  stenosis due        to shadowing circumferencial plaque.   Vertebrals: Bilateral vertebral arteries demonstrate antegrade flow.  Subclavians: Normal flow hemodynamics were seen in bilateral subclavian        arteries.   Assessment and Plan:  1.  Permanent atrial fibrillation with CHA2DS2-VASc score of 5.  He continues on Coumadin with follow-up by PCP.  Heart rate is slow with conduction system disease at baseline, he is not on any AV nodal blockers.  Not clearly symptomatic with current degree of bradycardia, we continue to follow and have discussed the possibility of pacemaker if this ultimately becomes indicated.  2.  Asymptomatic, moderate bilateral ICA stenoses.  Recent carotid Dopplers are noted above.  He is on low-dose aspirin and Lipitor.  Medication Adjustments/Labs and Tests Ordered: Current medicines are reviewed at length with the patient today.  Concerns regarding medicines are outlined above.   Tests Ordered: No orders of the defined types were placed in this encounter.   Medication Changes: No orders of the defined types were placed in this encounter.   Disposition:  Follow up 6 months in the [place] office.  Signed, Jonelle Sidle, MD, [facility] 11/15/2020 9:57 AM    [facility] at [facility] [address] [place], [place], [place] [address] Phone: [phone]; Fax: [phone]

## 2020-11-15 ENCOUNTER — Other Ambulatory Visit: Payer: Self-pay

## 2020-11-15 ENCOUNTER — Encounter: Payer: Self-pay | Admitting: Cardiology

## 2020-11-15 ENCOUNTER — Ambulatory Visit: Payer: Medicare PPO | Admitting: Cardiology

## 2020-11-15 VITALS — BP 112/62 | HR 56 | Ht 67.0 in | Wt 202.0 lb

## 2020-11-15 DIAGNOSIS — I6523 Occlusion and stenosis of bilateral carotid arteries: Secondary | ICD-10-CM | POA: Diagnosis not present

## 2020-11-15 DIAGNOSIS — I4821 Permanent atrial fibrillation: Secondary | ICD-10-CM | POA: Diagnosis not present

## 2020-11-15 NOTE — Patient Instructions (Addendum)

## 2020-12-08 DIAGNOSIS — Z299 Encounter for prophylactic measures, unspecified: Secondary | ICD-10-CM | POA: Diagnosis not present

## 2020-12-08 DIAGNOSIS — E1165 Type 2 diabetes mellitus with hyperglycemia: Secondary | ICD-10-CM | POA: Diagnosis not present

## 2020-12-08 DIAGNOSIS — I739 Peripheral vascular disease, unspecified: Secondary | ICD-10-CM | POA: Diagnosis not present

## 2020-12-08 DIAGNOSIS — E1151 Type 2 diabetes mellitus with diabetic peripheral angiopathy without gangrene: Secondary | ICD-10-CM | POA: Diagnosis not present

## 2020-12-08 DIAGNOSIS — I4891 Unspecified atrial fibrillation: Secondary | ICD-10-CM | POA: Diagnosis not present

## 2020-12-08 DIAGNOSIS — Z6834 Body mass index (BMI) 34.0-34.9, adult: Secondary | ICD-10-CM | POA: Diagnosis not present

## 2020-12-08 DIAGNOSIS — I10 Essential (primary) hypertension: Secondary | ICD-10-CM | POA: Diagnosis not present

## 2020-12-30 DIAGNOSIS — L728 Other follicular cysts of the skin and subcutaneous tissue: Secondary | ICD-10-CM | POA: Diagnosis not present

## 2020-12-30 DIAGNOSIS — D225 Melanocytic nevi of trunk: Secondary | ICD-10-CM | POA: Diagnosis not present

## 2020-12-30 DIAGNOSIS — L905 Scar conditions and fibrosis of skin: Secondary | ICD-10-CM | POA: Diagnosis not present

## 2020-12-30 DIAGNOSIS — L570 Actinic keratosis: Secondary | ICD-10-CM | POA: Diagnosis not present

## 2020-12-30 DIAGNOSIS — Z85828 Personal history of other malignant neoplasm of skin: Secondary | ICD-10-CM | POA: Diagnosis not present

## 2021-01-12 DIAGNOSIS — I10 Essential (primary) hypertension: Secondary | ICD-10-CM | POA: Diagnosis not present

## 2021-01-12 DIAGNOSIS — I272 Pulmonary hypertension, unspecified: Secondary | ICD-10-CM | POA: Diagnosis not present

## 2021-01-12 DIAGNOSIS — Z299 Encounter for prophylactic measures, unspecified: Secondary | ICD-10-CM | POA: Diagnosis not present

## 2021-01-12 DIAGNOSIS — I4891 Unspecified atrial fibrillation: Secondary | ICD-10-CM | POA: Diagnosis not present

## 2021-01-27 DIAGNOSIS — L570 Actinic keratosis: Secondary | ICD-10-CM | POA: Diagnosis not present

## 2021-02-17 DIAGNOSIS — I10 Essential (primary) hypertension: Secondary | ICD-10-CM | POA: Diagnosis not present

## 2021-02-17 DIAGNOSIS — I4891 Unspecified atrial fibrillation: Secondary | ICD-10-CM | POA: Diagnosis not present

## 2021-02-17 DIAGNOSIS — Z6834 Body mass index (BMI) 34.0-34.9, adult: Secondary | ICD-10-CM | POA: Diagnosis not present

## 2021-02-17 DIAGNOSIS — I739 Peripheral vascular disease, unspecified: Secondary | ICD-10-CM | POA: Diagnosis not present

## 2021-02-17 DIAGNOSIS — E1165 Type 2 diabetes mellitus with hyperglycemia: Secondary | ICD-10-CM | POA: Diagnosis not present

## 2021-02-17 DIAGNOSIS — Z299 Encounter for prophylactic measures, unspecified: Secondary | ICD-10-CM | POA: Diagnosis not present

## 2021-02-24 DIAGNOSIS — L738 Other specified follicular disorders: Secondary | ICD-10-CM | POA: Diagnosis not present

## 2021-02-24 DIAGNOSIS — D485 Neoplasm of uncertain behavior of skin: Secondary | ICD-10-CM | POA: Diagnosis not present

## 2021-02-24 DIAGNOSIS — L570 Actinic keratosis: Secondary | ICD-10-CM | POA: Diagnosis not present

## 2021-03-21 DIAGNOSIS — I4891 Unspecified atrial fibrillation: Secondary | ICD-10-CM | POA: Diagnosis not present

## 2021-03-21 DIAGNOSIS — E1165 Type 2 diabetes mellitus with hyperglycemia: Secondary | ICD-10-CM | POA: Diagnosis not present

## 2021-03-21 DIAGNOSIS — I10 Essential (primary) hypertension: Secondary | ICD-10-CM | POA: Diagnosis not present

## 2021-03-21 DIAGNOSIS — I779 Disorder of arteries and arterioles, unspecified: Secondary | ICD-10-CM | POA: Diagnosis not present

## 2021-03-21 DIAGNOSIS — Z299 Encounter for prophylactic measures, unspecified: Secondary | ICD-10-CM | POA: Diagnosis not present

## 2021-03-21 DIAGNOSIS — Z6835 Body mass index (BMI) 35.0-35.9, adult: Secondary | ICD-10-CM | POA: Diagnosis not present

## 2021-04-05 DIAGNOSIS — Z1339 Encounter for screening examination for other mental health and behavioral disorders: Secondary | ICD-10-CM | POA: Diagnosis not present

## 2021-04-05 DIAGNOSIS — I4891 Unspecified atrial fibrillation: Secondary | ICD-10-CM | POA: Diagnosis not present

## 2021-04-05 DIAGNOSIS — R5383 Other fatigue: Secondary | ICD-10-CM | POA: Diagnosis not present

## 2021-04-05 DIAGNOSIS — Z1331 Encounter for screening for depression: Secondary | ICD-10-CM | POA: Diagnosis not present

## 2021-04-05 DIAGNOSIS — Z6835 Body mass index (BMI) 35.0-35.9, adult: Secondary | ICD-10-CM | POA: Diagnosis not present

## 2021-04-05 DIAGNOSIS — Z79899 Other long term (current) drug therapy: Secondary | ICD-10-CM | POA: Diagnosis not present

## 2021-04-05 DIAGNOSIS — Z299 Encounter for prophylactic measures, unspecified: Secondary | ICD-10-CM | POA: Diagnosis not present

## 2021-04-05 DIAGNOSIS — Z7189 Other specified counseling: Secondary | ICD-10-CM | POA: Diagnosis not present

## 2021-04-05 DIAGNOSIS — Z0000 Encounter for general adult medical examination without abnormal findings: Secondary | ICD-10-CM | POA: Diagnosis not present

## 2021-04-05 DIAGNOSIS — E7800 Pure hypercholesterolemia, unspecified: Secondary | ICD-10-CM | POA: Diagnosis not present

## 2021-04-05 DIAGNOSIS — Z125 Encounter for screening for malignant neoplasm of prostate: Secondary | ICD-10-CM | POA: Diagnosis not present

## 2021-04-21 DIAGNOSIS — I4891 Unspecified atrial fibrillation: Secondary | ICD-10-CM | POA: Diagnosis not present

## 2021-04-21 DIAGNOSIS — Z6835 Body mass index (BMI) 35.0-35.9, adult: Secondary | ICD-10-CM | POA: Diagnosis not present

## 2021-04-21 DIAGNOSIS — I739 Peripheral vascular disease, unspecified: Secondary | ICD-10-CM | POA: Diagnosis not present

## 2021-04-21 DIAGNOSIS — I10 Essential (primary) hypertension: Secondary | ICD-10-CM | POA: Diagnosis not present

## 2021-04-21 DIAGNOSIS — Z299 Encounter for prophylactic measures, unspecified: Secondary | ICD-10-CM | POA: Diagnosis not present

## 2021-04-26 ENCOUNTER — Other Ambulatory Visit: Payer: Self-pay | Admitting: Cardiology

## 2021-04-26 ENCOUNTER — Ambulatory Visit (INDEPENDENT_AMBULATORY_CARE_PROVIDER_SITE_OTHER): Payer: Medicare PPO

## 2021-04-26 DIAGNOSIS — I6523 Occlusion and stenosis of bilateral carotid arteries: Secondary | ICD-10-CM | POA: Diagnosis not present

## 2021-05-15 NOTE — Progress Notes (Signed)
Cardiology Office Note  Date: 05/16/2021   ID: Deunte, Bledsoe 12/19/1940, MRN [id number]  PCP:  Kirstie Peri, MD  Cardiologist:  [name], MD Electrophysiologist:  None   Chief Complaint  Patient presents with   Cardiac follow-up    History of Present Illness: Robert Baldwin is a 80 y.o. male last seen in January 2022.  He is here for a routine visit.  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  He does not describe any exertional chest pain, no dizziness or syncope, no specific change in stamina.  He follows with Dr. [name] on Coumadin.  He does not report any spontaneous bleeding problems.  Recent carotid Dopplers showed only 1 to 39% RICA stenosis and 40 to 59% LICA stenosis.  We went over his medications, he remains on Lipitor.  I personally reviewed his ECG today which shows slow atrial fibrillation with right bundle branch block.  Past Medical History:  Diagnosis Date   Atrial fibrillation (HCC)    Hyperlipidemia    Hypertension    Type 2 diabetes mellitus (HCC)     Past Surgical History:  Procedure Laterality Date   Cataract surgery     HERNIA REPAIR     YAG LASER APPLICATION Right 08/17/2016   Procedure: YAG LASER APPLICATION;  Surgeon: Susa Simmonds, MD;  Location: [facility];  Service: Ophthalmology;  Laterality: Right;    Current Outpatient Medications  Medication Sig Dispense Refill   amLODipine-valsartan (EXFORGE) 5-320 MG tablet Take 1 tablet by mouth daily.     Ascorbic Acid (VITAMIN C) 500 MG tablet Take 500 mg by mouth daily.       aspirin EC 81 MG tablet Take 81 mg by mouth daily.     atorvastatin (LIPITOR) 40 MG tablet Take 40 mg by mouth daily.       chlorthalidone (HYGROTON) 25 MG tablet Take 25 mg by mouth daily.       fish oil-omega-3 fatty acids 1000 MG capsule Take 1 g by mouth 2 (two) times daily.     glipiZIDE (GLUCOTROL) 10 MG tablet Take 10 mg by mouth daily before breakfast.     Glucosamine 500 MG CAPS Take 500 mg by  mouth 2 (two) times daily.     metFORMIN (GLUCOPHAGE) 1000 MG tablet Take 1,500 mg by mouth every morning. & 1000 mg in the evening     Multiple Vitamins-Minerals (MULTIVITAMIN WITH MINERALS) tablet Take 1 tablet by mouth daily.     niacin 250 MG tablet Take 250 mg by mouth daily with breakfast.     pioglitazone (ACTOS) 45 MG tablet Take 45 mg by mouth daily.     warfarin (COUMADIN) 5 MG tablet Take 2.5-7.5 mg by mouth daily. Managed by Dr. Margaretmary Eddy office Monday, Friday, & Saturday 7.5 mg,  Tuesday,Thursday, & Sunday- 5 mg,  Wednesday-2.5 mg     No current facility-administered medications for this visit.   Allergies:  Patient has no known allergies.   ROS: No syncope.  Physical Exam: VS:  BP 110/72   Pulse (!) 46   Ht 5\' 7"  (1.702 m)   Wt 212 lb 3.2 oz (96.3 kg)   SpO2 93%   BMI 33.24 kg/m , BMI Body mass index is 33.24 kg/m.  Wt Readings from Last 3 Encounters:  05/16/21 212 lb 3.2 oz (96.3 kg)  11/15/20 202 lb (91.6 kg)  03/23/20 212 lb 6.4 oz (96.3 kg)    General: Patient appears comfortable at rest. HEENT:  Conjunctiva and lids normal, wearing a mask. Neck: Supple, no elevated JVP or carotid bruits, no thyromegaly. Lungs: Clear to auscultation, nonlabored breathing at rest. Cardiac: Irregular, no S3, 1/6 systolic murmur, no pericardial rub. Extremities: No pitting edema.  ECG:  An ECG dated 03/23/2020 was personally reviewed today and demonstrated:  Atrial fibrillation with right bundle branch block and rightward axis.  Recent Labwork:  June 2021: Hemoglobin 12.0, platelets 170, BUN 23, creatinine 0.98, potassium 4.5, AST 25, ALT 19, cholesterol 111, triglycerides 40, HDL 49, LDL 51, TSH 1.16  Other Studies Reviewed Today:  Carotid Dopplers 04/26/2021: Summary:  Right Carotid: Velocities in the right ICA are consistent with a 1-39%  stenosis.                 The ECA appears >50% stenosed. Cannot rule out higher  stenosis                 due to circumferencial  shadowing plaque.   Left Carotid: Velocities in the left ICA are consistent with a 40-59%  stenosis.                The ECA appears >50% stenosed.   Vertebrals:  Bilateral vertebral arteries demonstrate antegrade flow.  Subclavians: Normal flow hemodynamics were seen in bilateral subclavian               arteries.   Assessment and Plan:  1.  Permanent atrial fibrillation with CHA2DS2-VASc score of 5.  He is on Coumadin for stroke prophylaxis with follow-up by PCP.  No palpitations.  2.  Conduction system disease with bradycardia, not clearly symptomatic.  We have discussed potential for pacemaker if the situation changes.  He is not on any AV nodal blockers.  3.  Asymptomatic carotid artery disease as discussed above, no significant progression.  Continue Lipitor.  Medication Adjustments/Labs and Tests Ordered: Current medicines are reviewed at length with the patient today.  Concerns regarding medicines are outlined above.   Tests Ordered: Orders Placed This Encounter  Procedures   EKG 12-Lead    Medication Changes: No orders of the defined types were placed in this encounter.   Disposition:  Follow up  6 months.  Signed, Jonelle Sidle, MD, [facility] 05/16/2021 9:35 AM    [facility] at [facility] [address] [place], [place], [place] [address] Phone: [phone]; Fax: [phone]

## 2021-05-16 ENCOUNTER — Other Ambulatory Visit: Payer: Self-pay

## 2021-05-16 ENCOUNTER — Encounter: Payer: Self-pay | Admitting: Cardiology

## 2021-05-16 ENCOUNTER — Ambulatory Visit: Payer: Medicare PPO | Admitting: Cardiology

## 2021-05-16 VITALS — BP 110/72 | HR 46 | Ht 67.0 in | Wt 212.2 lb

## 2021-05-16 DIAGNOSIS — I6523 Occlusion and stenosis of bilateral carotid arteries: Secondary | ICD-10-CM

## 2021-05-16 DIAGNOSIS — I4821 Permanent atrial fibrillation: Secondary | ICD-10-CM

## 2021-05-16 NOTE — Patient Instructions (Addendum)

## 2021-05-19 DIAGNOSIS — Z299 Encounter for prophylactic measures, unspecified: Secondary | ICD-10-CM | POA: Diagnosis not present

## 2021-05-19 DIAGNOSIS — Z6836 Body mass index (BMI) 36.0-36.9, adult: Secondary | ICD-10-CM | POA: Diagnosis not present

## 2021-05-19 DIAGNOSIS — I4891 Unspecified atrial fibrillation: Secondary | ICD-10-CM | POA: Diagnosis not present

## 2021-05-19 DIAGNOSIS — I10 Essential (primary) hypertension: Secondary | ICD-10-CM | POA: Diagnosis not present

## 2021-05-19 DIAGNOSIS — E1165 Type 2 diabetes mellitus with hyperglycemia: Secondary | ICD-10-CM | POA: Diagnosis not present

## 2021-06-21 DIAGNOSIS — I4891 Unspecified atrial fibrillation: Secondary | ICD-10-CM | POA: Diagnosis not present

## 2021-06-21 DIAGNOSIS — Z713 Dietary counseling and surveillance: Secondary | ICD-10-CM | POA: Diagnosis not present

## 2021-06-21 DIAGNOSIS — I10 Essential (primary) hypertension: Secondary | ICD-10-CM | POA: Diagnosis not present

## 2021-06-21 DIAGNOSIS — Z6836 Body mass index (BMI) 36.0-36.9, adult: Secondary | ICD-10-CM | POA: Diagnosis not present

## 2021-06-21 DIAGNOSIS — Z299 Encounter for prophylactic measures, unspecified: Secondary | ICD-10-CM | POA: Diagnosis not present

## 2021-07-07 DIAGNOSIS — I872 Venous insufficiency (chronic) (peripheral): Secondary | ICD-10-CM | POA: Diagnosis not present

## 2021-07-07 DIAGNOSIS — Z85828 Personal history of other malignant neoplasm of skin: Secondary | ICD-10-CM | POA: Diagnosis not present

## 2021-07-07 DIAGNOSIS — Z08 Encounter for follow-up examination after completed treatment for malignant neoplasm: Secondary | ICD-10-CM | POA: Diagnosis not present

## 2021-07-07 DIAGNOSIS — L821 Other seborrheic keratosis: Secondary | ICD-10-CM | POA: Diagnosis not present

## 2021-07-07 DIAGNOSIS — L718 Other rosacea: Secondary | ICD-10-CM | POA: Diagnosis not present

## 2021-07-07 DIAGNOSIS — C4442 Squamous cell carcinoma of skin of scalp and neck: Secondary | ICD-10-CM | POA: Diagnosis not present

## 2021-07-07 DIAGNOSIS — L989 Disorder of the skin and subcutaneous tissue, unspecified: Secondary | ICD-10-CM | POA: Diagnosis not present

## 2021-07-07 DIAGNOSIS — L570 Actinic keratosis: Secondary | ICD-10-CM | POA: Diagnosis not present

## 2021-07-07 DIAGNOSIS — L814 Other melanin hyperpigmentation: Secondary | ICD-10-CM | POA: Diagnosis not present

## 2021-07-07 DIAGNOSIS — L308 Other specified dermatitis: Secondary | ICD-10-CM | POA: Diagnosis not present

## 2021-07-07 DIAGNOSIS — D225 Melanocytic nevi of trunk: Secondary | ICD-10-CM | POA: Diagnosis not present

## 2021-07-20 DIAGNOSIS — Z6835 Body mass index (BMI) 35.0-35.9, adult: Secondary | ICD-10-CM | POA: Diagnosis not present

## 2021-07-20 DIAGNOSIS — D692 Other nonthrombocytopenic purpura: Secondary | ICD-10-CM | POA: Diagnosis not present

## 2021-07-20 DIAGNOSIS — Z299 Encounter for prophylactic measures, unspecified: Secondary | ICD-10-CM | POA: Diagnosis not present

## 2021-07-20 DIAGNOSIS — D6869 Other thrombophilia: Secondary | ICD-10-CM | POA: Diagnosis not present

## 2021-07-20 DIAGNOSIS — Z23 Encounter for immunization: Secondary | ICD-10-CM | POA: Diagnosis not present

## 2021-07-20 DIAGNOSIS — I10 Essential (primary) hypertension: Secondary | ICD-10-CM | POA: Diagnosis not present

## 2021-07-20 DIAGNOSIS — I4891 Unspecified atrial fibrillation: Secondary | ICD-10-CM | POA: Diagnosis not present

## 2021-08-02 DIAGNOSIS — D044 Carcinoma in situ of skin of scalp and neck: Secondary | ICD-10-CM | POA: Diagnosis not present

## 2021-08-02 DIAGNOSIS — L570 Actinic keratosis: Secondary | ICD-10-CM | POA: Diagnosis not present

## 2021-08-18 DIAGNOSIS — I10 Essential (primary) hypertension: Secondary | ICD-10-CM | POA: Diagnosis not present

## 2021-08-18 DIAGNOSIS — Z299 Encounter for prophylactic measures, unspecified: Secondary | ICD-10-CM | POA: Diagnosis not present

## 2021-08-18 DIAGNOSIS — E1165 Type 2 diabetes mellitus with hyperglycemia: Secondary | ICD-10-CM | POA: Diagnosis not present

## 2021-08-18 DIAGNOSIS — I4891 Unspecified atrial fibrillation: Secondary | ICD-10-CM | POA: Diagnosis not present

## 2021-08-18 DIAGNOSIS — M7050 Other bursitis of knee, unspecified knee: Secondary | ICD-10-CM | POA: Diagnosis not present

## 2021-08-18 DIAGNOSIS — Z6835 Body mass index (BMI) 35.0-35.9, adult: Secondary | ICD-10-CM | POA: Diagnosis not present

## 2021-09-04 DIAGNOSIS — L905 Scar conditions and fibrosis of skin: Secondary | ICD-10-CM | POA: Diagnosis not present

## 2021-09-04 DIAGNOSIS — L578 Other skin changes due to chronic exposure to nonionizing radiation: Secondary | ICD-10-CM | POA: Diagnosis not present

## 2021-09-04 DIAGNOSIS — L570 Actinic keratosis: Secondary | ICD-10-CM | POA: Diagnosis not present

## 2021-09-07 DIAGNOSIS — Z6835 Body mass index (BMI) 35.0-35.9, adult: Secondary | ICD-10-CM | POA: Diagnosis not present

## 2021-09-07 DIAGNOSIS — E162 Hypoglycemia, unspecified: Secondary | ICD-10-CM | POA: Diagnosis not present

## 2021-09-07 DIAGNOSIS — Z299 Encounter for prophylactic measures, unspecified: Secondary | ICD-10-CM | POA: Diagnosis not present

## 2021-09-07 DIAGNOSIS — R5383 Other fatigue: Secondary | ICD-10-CM | POA: Diagnosis not present

## 2021-09-07 DIAGNOSIS — J209 Acute bronchitis, unspecified: Secondary | ICD-10-CM | POA: Diagnosis not present

## 2021-09-07 DIAGNOSIS — R059 Cough, unspecified: Secondary | ICD-10-CM | POA: Diagnosis not present

## 2021-09-18 DIAGNOSIS — I4891 Unspecified atrial fibrillation: Secondary | ICD-10-CM | POA: Diagnosis not present

## 2021-09-18 DIAGNOSIS — Z6832 Body mass index (BMI) 32.0-32.9, adult: Secondary | ICD-10-CM | POA: Diagnosis not present

## 2021-09-18 DIAGNOSIS — R079 Chest pain, unspecified: Secondary | ICD-10-CM | POA: Diagnosis not present

## 2021-09-18 DIAGNOSIS — I10 Essential (primary) hypertension: Secondary | ICD-10-CM | POA: Diagnosis not present

## 2021-09-18 DIAGNOSIS — I779 Disorder of arteries and arterioles, unspecified: Secondary | ICD-10-CM | POA: Diagnosis not present

## 2021-09-18 DIAGNOSIS — Z299 Encounter for prophylactic measures, unspecified: Secondary | ICD-10-CM | POA: Diagnosis not present

## 2021-10-24 DIAGNOSIS — E113293 Type 2 diabetes mellitus with mild nonproliferative diabetic retinopathy without macular edema, bilateral: Secondary | ICD-10-CM | POA: Diagnosis not present

## 2021-10-24 DIAGNOSIS — H524 Presbyopia: Secondary | ICD-10-CM | POA: Diagnosis not present

## 2021-10-25 DIAGNOSIS — I10 Essential (primary) hypertension: Secondary | ICD-10-CM | POA: Diagnosis not present

## 2021-10-25 DIAGNOSIS — Z299 Encounter for prophylactic measures, unspecified: Secondary | ICD-10-CM | POA: Diagnosis not present

## 2021-10-25 DIAGNOSIS — Z789 Other specified health status: Secondary | ICD-10-CM | POA: Diagnosis not present

## 2021-10-25 DIAGNOSIS — Z6832 Body mass index (BMI) 32.0-32.9, adult: Secondary | ICD-10-CM | POA: Diagnosis not present

## 2021-10-25 DIAGNOSIS — I4891 Unspecified atrial fibrillation: Secondary | ICD-10-CM | POA: Diagnosis not present

## 2021-11-03 DIAGNOSIS — Z23 Encounter for immunization: Secondary | ICD-10-CM | POA: Diagnosis not present

## 2021-11-07 ENCOUNTER — Telehealth: Payer: Self-pay | Admitting: Cardiology

## 2021-11-07 DIAGNOSIS — Z713 Dietary counseling and surveillance: Secondary | ICD-10-CM | POA: Diagnosis not present

## 2021-11-07 DIAGNOSIS — Z299 Encounter for prophylactic measures, unspecified: Secondary | ICD-10-CM | POA: Diagnosis not present

## 2021-11-07 DIAGNOSIS — Z0000 Encounter for general adult medical examination without abnormal findings: Secondary | ICD-10-CM | POA: Diagnosis not present

## 2021-11-07 DIAGNOSIS — Z6834 Body mass index (BMI) 34.0-34.9, adult: Secondary | ICD-10-CM | POA: Diagnosis not present

## 2021-11-07 DIAGNOSIS — I10 Essential (primary) hypertension: Secondary | ICD-10-CM | POA: Diagnosis not present

## 2021-11-07 NOTE — Telephone Encounter (Signed)
Office calling to get a copy of patient last procedures that he has done. Office fax number is [phone]. Place advise

## 2021-11-10 DIAGNOSIS — D225 Melanocytic nevi of trunk: Secondary | ICD-10-CM | POA: Diagnosis not present

## 2021-11-10 DIAGNOSIS — D485 Neoplasm of uncertain behavior of skin: Secondary | ICD-10-CM | POA: Diagnosis not present

## 2021-11-10 DIAGNOSIS — Z85828 Personal history of other malignant neoplasm of skin: Secondary | ICD-10-CM | POA: Diagnosis not present

## 2021-11-10 DIAGNOSIS — Z08 Encounter for follow-up examination after completed treatment for malignant neoplasm: Secondary | ICD-10-CM | POA: Diagnosis not present

## 2021-11-10 DIAGNOSIS — L821 Other seborrheic keratosis: Secondary | ICD-10-CM | POA: Diagnosis not present

## 2021-11-10 DIAGNOSIS — L814 Other melanin hyperpigmentation: Secondary | ICD-10-CM | POA: Diagnosis not present

## 2021-11-10 DIAGNOSIS — C4441 Basal cell carcinoma of skin of scalp and neck: Secondary | ICD-10-CM | POA: Diagnosis not present

## 2021-11-10 DIAGNOSIS — L570 Actinic keratosis: Secondary | ICD-10-CM | POA: Diagnosis not present

## 2021-11-23 DIAGNOSIS — I10 Essential (primary) hypertension: Secondary | ICD-10-CM | POA: Diagnosis not present

## 2021-11-23 DIAGNOSIS — Z6834 Body mass index (BMI) 34.0-34.9, adult: Secondary | ICD-10-CM | POA: Diagnosis not present

## 2021-11-23 DIAGNOSIS — E1165 Type 2 diabetes mellitus with hyperglycemia: Secondary | ICD-10-CM | POA: Diagnosis not present

## 2021-11-23 DIAGNOSIS — I4891 Unspecified atrial fibrillation: Secondary | ICD-10-CM | POA: Diagnosis not present

## 2021-11-23 DIAGNOSIS — Z299 Encounter for prophylactic measures, unspecified: Secondary | ICD-10-CM | POA: Diagnosis not present

## 2021-11-24 ENCOUNTER — Telehealth: Payer: Self-pay | Admitting: Cardiology

## 2021-11-24 NOTE — Telephone Encounter (Signed)
Spoke with wife Babette Relic) - informed her that this can be addressed at his upcoming visit with Dr. Diona Browner on 12/07/2021.  She verbalized understanding.

## 2021-11-24 NOTE — Telephone Encounter (Signed)
° °  Pt's wife calling, she said, pt saw pcp yesterday and was told by Dr. Manuella Ghazi he wants him to get an ultrasound of his heart, they would like to ask Dr. Domenic Polite if he can do it instead since he his pt's heart doctor

## 2021-12-06 NOTE — Progress Notes (Signed)
Cardiology Office Note  Date: 12/07/2021   ID: Sherrod, Hatchell 10-29-1940, MRN [id number]  PCP:  Monico Blitz, MD  Cardiologist:  Rozann Lesches, MD Electrophysiologist:  None   Chief Complaint  Patient presents with   Cardiac follow-up    History of Present Illness: Robert Baldwin is an 81 y.o. male last seen in July 2022.  He is here for a routine visit.  He does not report any major change in status, NYHA class II dyspnea, no exertional chest pain.  No sense of palpitations with his long-term atrial fibrillation, also no sudden dizziness or syncope.  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  He remains on Coumadin with follow-up by Dr. Manuella Ghazi.  He has an echocardiogram scheduled with Dr. Manuella Ghazi next week.  I reviewed his medications, he is not on any AV nodal blockers due to bradycardia.  He remains on chlorthalidone and Exforge for management of hypertension.  Past Medical History:  Diagnosis Date   Atrial fibrillation ([place])    Hyperlipidemia    Hypertension    Type 2 diabetes mellitus ([place])     Past Surgical History:  Procedure Laterality Date   Cataract surgery     HERNIA REPAIR     YAG LASER APPLICATION Right [id number]   Procedure: YAG LASER APPLICATION;  Surgeon: Williams Che, MD;  Location: [facility];  Service: Ophthalmology;  Laterality: Right;    Current Outpatient Medications  Medication Sig Dispense Refill   amLODipine-valsartan (EXFORGE) 5-320 MG tablet Take 1 tablet by mouth daily.     Ascorbic Acid (VITAMIN C) 500 MG tablet Take 500 mg by mouth daily.       aspirin EC 81 MG tablet Take 81 mg by mouth daily.     atorvastatin (LIPITOR) 40 MG tablet Take 40 mg by mouth daily.       chlorthalidone (HYGROTON) 25 MG tablet Take 25 mg by mouth daily.       Cholecalciferol (VITAMIN D3 PO) Take 1 tablet by mouth daily.     fish oil-omega-3 fatty acids 1000 MG capsule Take 1 g by mouth 2 (two) times daily.     glipiZIDE (GLUCOTROL) 10 MG tablet Take 10  mg by mouth daily before breakfast.     Glucosamine 500 MG CAPS Take 500 mg by mouth 2 (two) times daily.     metFORMIN (GLUCOPHAGE) 1000 MG tablet Take 1,500 mg by mouth every morning. & 1000 mg in the evening     Multiple Vitamins-Minerals (MULTIVITAMIN WITH MINERALS) tablet Take 1 tablet by mouth daily.     niacin 250 MG tablet Take 250 mg by mouth daily with breakfast.     pioglitazone (ACTOS) 45 MG tablet Take 45 mg by mouth daily.     warfarin (COUMADIN) 5 MG tablet Take 2.5-7.5 mg by mouth daily. Managed by Dr. Trena Platt office Monday, Friday, & Saturday 7.5 mg,  Tuesday,Thursday, & Sunday- 5 mg,  Wednesday-2.5 mg     No current facility-administered medications for this visit.   Allergies:  Patient has no known allergies.   ROS: No falls or syncope.  Physical Exam: VS:  BP 140/70    Pulse (!) 52    Ht 5\' 7"  (1.702 m)    Wt 207 lb 9.6 oz (94.2 kg)    SpO2 95%    BMI 32.51 kg/m , BMI Body mass index is 32.51 kg/m.  Wt Readings from Last 3 Encounters:  12/07/21 207 lb 9.6 oz (94.2  kg)  05/16/21 212 lb 3.2 oz (96.3 kg)  11/15/20 202 lb (91.6 kg)    General: Patient appears comfortable at rest. HEENT: Conjunctiva and lids normal, wearing a mask. Neck: Supple, no elevated JVP or carotid bruits, no thyromegaly. Lungs: Clear to auscultation, nonlabored breathing at rest. Cardiac: Irregularly irregular, no S3, 1/6 systolic murmur. Extremities: 1+ lower leg edema.  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  Recent Labwork:  June 2021: Hemoglobin 12.0, platelets 170, BUN 23, creatinine 0.98, potassium 4.5, AST 25, ALT 19, cholesterol 111, triglycerides 40, HDL 49, LDL 51, TSH 1.16  Other Studies Reviewed Today:  Carotid Dopplers 04/26/2021: Summary:  Right Carotid: Velocities in the right ICA are consistent with a 1-39%  stenosis.                 The ECA appears >50% stenosed. Cannot rule out  higher  stenosis                 due to circumferencial shadowing plaque.   Left Carotid: Velocities in the left ICA are consistent with a 40-59%  stenosis.                The ECA appears >50% stenosed.   Vertebrals:  Bilateral vertebral arteries demonstrate antegrade flow.  Subclavians: Normal flow hemodynamics were seen in bilateral subclavian               arteries.   Assessment and Plan:  1.  Permanent atrial fibrillation with CHA2DS2-VASc score 5.  He remains asymptomatic in terms of palpitations and has not required any AV nodal blockers given conduction system disease with baseline bradycardia.  No dizziness or syncope.  He remains on Coumadin for stroke prophylaxis with follow-up by Dr. Manuella Ghazi.  2.  Essential hypertension, on Exforge and chlorthalidone.  3.  Asymptomatic carotid artery disease, mild to moderate by Dopplers in July 2022.  Continue Lipitor.  Medication Adjustments/Labs and Tests Ordered: Current medicines are reviewed at length with the patient today.  Concerns regarding medicines are outlined above.   Tests Ordered: No orders of the defined types were placed in this encounter.   Medication Changes: No orders of the defined types were placed in this encounter.   Disposition:  Follow up  6 months.  Signed, Satira Sark, MD, [facility] 12/07/2021 10:07 AM    [facility] [place], [place], [place] [address] Phone: [phone]; Fax: [phone]

## 2021-12-07 ENCOUNTER — Ambulatory Visit: Payer: Medicare PPO | Admitting: Cardiology

## 2021-12-07 ENCOUNTER — Encounter: Payer: Self-pay | Admitting: Cardiology

## 2021-12-07 VITALS — BP 140/70 | HR 52 | Ht 67.0 in | Wt 207.6 lb

## 2021-12-07 DIAGNOSIS — I10 Essential (primary) hypertension: Secondary | ICD-10-CM | POA: Diagnosis not present

## 2021-12-07 DIAGNOSIS — I6523 Occlusion and stenosis of bilateral carotid arteries: Secondary | ICD-10-CM | POA: Diagnosis not present

## 2021-12-07 DIAGNOSIS — I4821 Permanent atrial fibrillation: Secondary | ICD-10-CM

## 2021-12-07 NOTE — Patient Instructions (Addendum)

## 2021-12-11 DIAGNOSIS — I480 Paroxysmal atrial fibrillation: Secondary | ICD-10-CM | POA: Diagnosis not present

## 2021-12-15 DIAGNOSIS — C44629 Squamous cell carcinoma of skin of left upper limb, including shoulder: Secondary | ICD-10-CM | POA: Diagnosis not present

## 2021-12-15 DIAGNOSIS — D485 Neoplasm of uncertain behavior of skin: Secondary | ICD-10-CM | POA: Diagnosis not present

## 2021-12-15 DIAGNOSIS — L570 Actinic keratosis: Secondary | ICD-10-CM | POA: Diagnosis not present

## 2021-12-18 ENCOUNTER — Telehealth: Payer: Self-pay | Admitting: Cardiology

## 2021-12-18 NOTE — Telephone Encounter (Signed)
Patient's wife is calling stating she is returning 76 call from today.

## 2021-12-19 NOTE — Telephone Encounter (Signed)
-----   Message from Jonelle Sidle, MD sent at 12/13/2021  3:17 PM EST ----- Results reviewed.  Echocardiogram done at [facility].  LVEF remains normal at 55%.  Left atrium significantly enlarged consistent with long-term atrial fibrillation.  Reportedly mild mitral regurgitation and mild aortic stenosis, neither of which should be causing symptoms.  Moderate tricuspid regurgitation, does not appear that there was an accurate estimation of RV systolic pressure per review of the study results.  Would continue with current follow-up plan, no change in regimen.

## 2021-12-20 NOTE — Telephone Encounter (Signed)
Patient's wife informed

## 2021-12-21 DIAGNOSIS — Z789 Other specified health status: Secondary | ICD-10-CM | POA: Diagnosis not present

## 2021-12-21 DIAGNOSIS — I10 Essential (primary) hypertension: Secondary | ICD-10-CM | POA: Diagnosis not present

## 2021-12-21 DIAGNOSIS — I4891 Unspecified atrial fibrillation: Secondary | ICD-10-CM | POA: Diagnosis not present

## 2021-12-21 DIAGNOSIS — Z6835 Body mass index (BMI) 35.0-35.9, adult: Secondary | ICD-10-CM | POA: Diagnosis not present

## 2021-12-21 DIAGNOSIS — Z299 Encounter for prophylactic measures, unspecified: Secondary | ICD-10-CM | POA: Diagnosis not present

## 2022-01-12 DIAGNOSIS — D0462 Carcinoma in situ of skin of left upper limb, including shoulder: Secondary | ICD-10-CM | POA: Diagnosis not present

## 2022-01-12 DIAGNOSIS — C44319 Basal cell carcinoma of skin of other parts of face: Secondary | ICD-10-CM | POA: Diagnosis not present

## 2022-01-23 DIAGNOSIS — Z299 Encounter for prophylactic measures, unspecified: Secondary | ICD-10-CM | POA: Diagnosis not present

## 2022-01-23 DIAGNOSIS — E1165 Type 2 diabetes mellitus with hyperglycemia: Secondary | ICD-10-CM | POA: Diagnosis not present

## 2022-01-23 DIAGNOSIS — I10 Essential (primary) hypertension: Secondary | ICD-10-CM | POA: Diagnosis not present

## 2022-01-23 DIAGNOSIS — I4891 Unspecified atrial fibrillation: Secondary | ICD-10-CM | POA: Diagnosis not present

## 2022-02-22 DIAGNOSIS — Z6834 Body mass index (BMI) 34.0-34.9, adult: Secondary | ICD-10-CM | POA: Diagnosis not present

## 2022-02-22 DIAGNOSIS — E1165 Type 2 diabetes mellitus with hyperglycemia: Secondary | ICD-10-CM | POA: Diagnosis not present

## 2022-02-22 DIAGNOSIS — I10 Essential (primary) hypertension: Secondary | ICD-10-CM | POA: Diagnosis not present

## 2022-02-22 DIAGNOSIS — I4891 Unspecified atrial fibrillation: Secondary | ICD-10-CM | POA: Diagnosis not present

## 2022-02-22 DIAGNOSIS — Z299 Encounter for prophylactic measures, unspecified: Secondary | ICD-10-CM | POA: Diagnosis not present

## 2022-03-26 ENCOUNTER — Telehealth: Payer: Self-pay | Admitting: Cardiology

## 2022-03-26 NOTE — Telephone Encounter (Signed)
Wife reports Wednesday, Thursday and Friday last week, patient fell asleep in truck. Also reports being more fatigue. Per wife, patient doesn't sleep well at night, moans, groans and snores. Also reports patient wakes up each morning at 4:00 am. Reports SOB after climbing 3 steps for 1 month. Denies dizziness or chest pain. Has not checked BP or HR at home but says it was checked at Dr. Margaretmary Eddy office last week and was normal. Patient currently working (truck driver). Wife said she wanted to make [facility] aware of fatigue and SOB. Advised to contact PCP with fatigue and tiredness. Says patient has appointment with [name] on 04/09/2022.

## 2022-03-26 NOTE — Telephone Encounter (Signed)
  Pt's wife said, pt been very tired and sleepy. She said last Wednesday to Friday of last week, pt will come home and fell asleep in his truck because he was too tired. She also noticed that pt get short of breath after taking 3 steps on their front porch. She is very concern

## 2022-03-27 ENCOUNTER — Ambulatory Visit (INDEPENDENT_AMBULATORY_CARE_PROVIDER_SITE_OTHER): Payer: Medicare PPO

## 2022-03-27 DIAGNOSIS — I6523 Occlusion and stenosis of bilateral carotid arteries: Secondary | ICD-10-CM | POA: Diagnosis not present

## 2022-03-27 NOTE — Telephone Encounter (Signed)
Patient's wife informed and verbalized understanding of plan. Tammy, will discuss with patient and contact our office if patient agrees to referral since he has refused sleep study in the past when PCP suggested it.

## 2022-03-29 ENCOUNTER — Telehealth: Payer: Self-pay | Admitting: *Deleted

## 2022-03-29 DIAGNOSIS — I6523 Occlusion and stenosis of bilateral carotid arteries: Secondary | ICD-10-CM

## 2022-03-29 NOTE — Telephone Encounter (Signed)
-----   Message from Jonelle Sidle, MD sent at 03/27/2022  2:28 PM EDT ----- Results reviewed.  Mild ICA stenosis on the right with 60 to 79% LICA stenosis, progressed compared to last assessment.  Left-sided plaque may be underestimated due to artifact.  Let's make referral to Dr. Arbie Cookey in [place] to see if additional imaging studies might need to be considered.  Continue aspirin and Lipitor.

## 2022-03-29 NOTE — Telephone Encounter (Signed)
Patient's wife informed and verbalized understanding of plan. Copy sent to PCP °

## 2022-04-09 DIAGNOSIS — R5383 Other fatigue: Secondary | ICD-10-CM | POA: Diagnosis not present

## 2022-04-09 DIAGNOSIS — Z0000 Encounter for general adult medical examination without abnormal findings: Secondary | ICD-10-CM | POA: Diagnosis not present

## 2022-04-09 DIAGNOSIS — I4891 Unspecified atrial fibrillation: Secondary | ICD-10-CM | POA: Diagnosis not present

## 2022-04-09 DIAGNOSIS — Z1331 Encounter for screening for depression: Secondary | ICD-10-CM | POA: Diagnosis not present

## 2022-04-09 DIAGNOSIS — R0602 Shortness of breath: Secondary | ICD-10-CM | POA: Diagnosis not present

## 2022-04-09 DIAGNOSIS — Z125 Encounter for screening for malignant neoplasm of prostate: Secondary | ICD-10-CM | POA: Diagnosis not present

## 2022-04-09 DIAGNOSIS — E7800 Pure hypercholesterolemia, unspecified: Secondary | ICD-10-CM | POA: Diagnosis not present

## 2022-04-09 DIAGNOSIS — Z1339 Encounter for screening examination for other mental health and behavioral disorders: Secondary | ICD-10-CM | POA: Diagnosis not present

## 2022-04-09 DIAGNOSIS — Z6836 Body mass index (BMI) 36.0-36.9, adult: Secondary | ICD-10-CM | POA: Diagnosis not present

## 2022-04-09 DIAGNOSIS — Z79899 Other long term (current) drug therapy: Secondary | ICD-10-CM | POA: Diagnosis not present

## 2022-04-11 ENCOUNTER — Ambulatory Visit: Payer: Medicare PPO | Admitting: Vascular Surgery

## 2022-04-11 ENCOUNTER — Other Ambulatory Visit: Payer: Self-pay

## 2022-04-11 ENCOUNTER — Telehealth: Payer: Self-pay | Admitting: Cardiology

## 2022-04-11 ENCOUNTER — Encounter: Payer: Self-pay | Admitting: Vascular Surgery

## 2022-04-11 VITALS — BP 189/68 | HR 60 | Temp 98.4°F | Resp 14 | Ht 67.0 in | Wt 216.8 lb

## 2022-04-11 DIAGNOSIS — I6523 Occlusion and stenosis of bilateral carotid arteries: Secondary | ICD-10-CM

## 2022-04-11 NOTE — Progress Notes (Signed)
[facility] [place]  Patient name: Robert Baldwin MRN: [id number] DOB: 10/22/1941 Sex: male  REASON FOR CONSULT: Evaluation of carotid occlusive disease  HPI: Marten Marchak Heggen is a 81 y.o. male, who is here today for evaluation of carotid disease.  He is here today with his wife who takes an active role in his care as well.  He has noninvasive studies over the past several years and has had progression in his degree of narrowing.  Most recently this suggested potential critical disease.  He is here today for further discussion of this.  He denies any prior history of stroke or amaurosis fugax or aphasia.  He did have an episode approximately 15 years ago where he had some dizziness and tingling and was felt to have a "TIA".  This was nonfocal.    He is having increasing shortness of breath.  He reports this been progressive over the past month or 2.  He is having great deal of lower extremity edema which is new for him as well  Past Medical History:  Diagnosis Date   Atrial fibrillation (HCC)    Hyperlipidemia    Hypertension    Type 2 diabetes mellitus (HCC)     Family History  Problem Relation Age of Onset   Stroke Other    Heart disease Other     SOCIAL HISTORY: Social History   Socioeconomic History   Marital status: Married    Spouse name: Not on file   Number of children: Not on file   Years of education: Not on file   Highest education level: Not on file  Occupational History   Occupation: Full time    Comment: Contractor  Tobacco Use   Smoking status: Never   Smokeless tobacco: Never  Substance and Sexual Activity   Alcohol use: No    Alcohol/week: 0.0 standard drinks of alcohol   Drug use: No   Sexual activity: Not on file  Other Topics Concern   Not on file  Social History Narrative   Not on file   Social Determinants of Health   Financial Resource Strain: Not on file  Food Insecurity: Not on file   Transportation Needs: Not on file  Physical Activity: Not on file  Stress: Not on file  Social Connections: Not on file  Intimate Partner Violence: Not on file    No Known Allergies  Current Outpatient Medications  Medication Sig Dispense Refill   amLODipine-valsartan (EXFORGE) 5-320 MG tablet Take 1 tablet by mouth daily.     Ascorbic Acid (VITAMIN C) 500 MG tablet Take 500 mg by mouth daily.       aspirin EC 81 MG tablet Take 81 mg by mouth daily.     atorvastatin (LIPITOR) 40 MG tablet Take 40 mg by mouth daily.       chlorthalidone (HYGROTON) 25 MG tablet Take 25 mg by mouth daily.       Cholecalciferol (VITAMIN D3 PO) Take 1 tablet by mouth daily.     fish oil-omega-3 fatty acids 1000 MG capsule Take 1 g by mouth 2 (two) times daily.     glipiZIDE (GLUCOTROL) 10 MG tablet Take 10 mg by mouth daily before breakfast.     Glucosamine 500 MG CAPS Take 500 mg by mouth 2 (two) times daily.     metFORMIN (GLUCOPHAGE) 1000 MG tablet Take 1,500 mg by mouth every morning. & 1000 mg in the evening     Multiple Vitamins-Minerals (  MULTIVITAMIN WITH MINERALS) tablet Take 1 tablet by mouth daily.     niacin 250 MG tablet Take 250 mg by mouth daily with breakfast.     pioglitazone (ACTOS) 45 MG tablet Take 45 mg by mouth daily.     warfarin (COUMADIN) 5 MG tablet Take 2.5-7.5 mg by mouth daily. Managed by Dr. Margaretmary Eddy office Monday, Friday, & Saturday 7.5 mg,  Tuesday,Thursday, & Sunday- 5 mg,  Wednesday-2.5 mg     No current facility-administered medications for this visit.    REVIEW OF SYSTEMS:  [X]  denotes positive finding, [ ]  denotes negative finding Cardiac  Comments:  Chest pain or chest pressure:    Shortness of breath upon exertion: x   Short of breath when lying flat: x   Irregular heart rhythm: x       Vascular    Pain in calf, thigh, or hip brought on by ambulation:    Pain in feet at night that wakes you up from your sleep:     Blood clot in your veins:    Leg swelling:   x       Pulmonary    Oxygen at home:    Productive cough:     Wheezing:         Neurologic    Sudden weakness in arms or legs:     Sudden numbness in arms or legs:     Sudden onset of difficulty speaking or slurred speech:    Temporary loss of vision in one eye:     Problems with dizziness:         Gastrointestinal    Blood in stool:     Vomited blood:         Genitourinary    Burning when urinating:     Blood in urine:        Psychiatric    Major depression:         Hematologic    Bleeding problems:    Problems with blood clotting too easily:        Skin    Rashes or ulcers:        Constitutional    Fever or chills:      PHYSICAL EXAM: Vitals:   04/11/22 0920 04/11/22 0924  BP: (!) 185/77 (!) 189/68  Pulse: 60   Resp: 14   Temp: 98.4 F (36.9 C)   TempSrc: Temporal   SpO2: 98%   Weight: 216 lb 12.8 oz (98.3 kg)   Height: 5\' 7"  (1.702 m)     GENERAL: The patient is a well-nourished male, in no acute distress. The vital signs are documented above. CARDIOVASCULAR: Carotid arteries without bruits bilaterally.  2+ radial pulses bilaterally.  He does have marked pitting edema in both lower extremities. PULMONARY: There is good air exchange  MUSCULOSKELETAL: There are no major deformities or cyanosis. NEUROLOGIC: No focal weakness or paresthesias are detected. SKIN: There are no ulcers or rashes noted. PSYCHIATRIC: The patient has a normal affect.  DATA:  Noninvasive studies from 03/27/2022 were reviewed with the patient.  This showed some progression of his predicted level of stenosis on his left internal carotid artery.  His systolic velocities are 419 cm/s diastolic or 72 cm/s.  He does have circumferential shadowing plaque making prediction of his degree of stenosis difficult.  No significant stenosis in the right internal carotid artery  MEDICAL ISSUES: I discussed these findings at length with the patient.  I have recommended CT angiogram for better  definition of his left internal carotid artery stenosis.  He is right-handed.  I explained that this is his dominant hemisphere.  We will contact him following CT to review his results and make further recommendations.  He will communicate with Dr.Shah and Diona Browner regarding his increased shortness of breath and lower extremity swelling   Larina Earthly, MD FACS [facility] Tel [phone] Pager [phone]  Note: Portions of this report may have been transcribed using voice recognition software.  Every effort has been made to ensure accuracy; however, inadvertent computerized transcription errors may still be present.

## 2022-04-11 NOTE — Telephone Encounter (Signed)
Pt c/o swelling: STAT is pt has developed SOB within 24 hours  How much weight have you gained and in what time span?  When patient saw Dr. Clelia Croft he gained about 18 lbs in 1 month  If swelling, where is the swelling located?  Legs, ankles, stomach  Are you currently taking a fluid pill?  No, but patient's wife states he want to start taking a fluid pill if possible  Are you currently SOB?  Patient's wife states the patient is asleep  Do you have a log of your daily weights (if so, list)? See vital encounters  Have you gained 3 pounds in a day or 5 pounds in a week?    Have you traveled recently?  No

## 2022-04-12 ENCOUNTER — Ambulatory Visit (HOSPITAL_COMMUNITY)
Admission: RE | Admit: 2022-04-12 | Discharge: 2022-04-12 | Disposition: A | Discharge status: Home / Self Care | Payer: Medicare PPO | Source: Ambulatory Visit | Attending: Vascular Surgery | Admitting: Vascular Surgery

## 2022-04-12 ENCOUNTER — Telehealth: Payer: Self-pay

## 2022-04-12 DIAGNOSIS — I7789 Other specified disorders of arteries and arterioles: Secondary | ICD-10-CM | POA: Diagnosis not present

## 2022-04-12 DIAGNOSIS — R600 Localized edema: Secondary | ICD-10-CM | POA: Diagnosis not present

## 2022-04-12 DIAGNOSIS — I6523 Occlusion and stenosis of bilateral carotid arteries: Secondary | ICD-10-CM | POA: Insufficient documentation

## 2022-04-12 DIAGNOSIS — I672 Cerebral atherosclerosis: Secondary | ICD-10-CM | POA: Diagnosis not present

## 2022-04-12 LAB — POCT I-STAT CREATININE: Creatinine, Ser: 1.1 mg/dL (ref 0.61–1.24)

## 2022-04-12 MED ORDER — IOHEXOL 350 MG/ML SOLN
75.0000 mL | Freq: Once | INTRAVENOUS | Status: AC | PRN
  Administered 2022-04-12: 75 mL via INTRAVENOUS

## 2022-04-12 MED ORDER — FUROSEMIDE 20 MG PO TABS: 20.0000 mg | ORAL_TABLET | Freq: Every day | ORAL | 0 refills | Status: DC

## 2022-04-12 NOTE — Telephone Encounter (Addendum)
     Unclear baseline weight as he was at 207 lbs at the time of his office visit in 11/2021. Would provide Rx for Lasix to take 20mg  for the next 4 days. Call back with weights early next week. Would follow daily weights on home scales for consistency. If weight remains elevated, may need to continue Lasix longer but would need to hold Chlorthalidone and obtain follow-up labs for reassessment of electrolytes and kidney function.   Signed, , PA-C 04/12/2022, 10:12 AM Pager: [phone]

## 2022-04-12 NOTE — Telephone Encounter (Signed)
Pt wife Tammy notified of Lasix for 4 days and to weigh daily.

## 2022-04-12 NOTE — Telephone Encounter (Signed)
Spoke with pt and wife who state that pt has gained 18 lbs in the last month. Pt states that he normally weighs 200 lbs, but on yesterday weight was 216 lbs. Pt does c/o SOB and denies chest pain. Pt denies having trouble sleeping at night. States that he only uses one pillow. Please advise.

## 2022-04-12 NOTE — Telephone Encounter (Signed)
Ruby with [facility] called with stat findings on the pt's CT neck done today, lf vm.  Ruby called again, two identifiers used. Reviewed chart to ensure study report was present.  Rescheduled pt's appt to next available since CT was done earlier than expected. Called pt, two identifiers used. Informed pt of new appt date and time. Pt confirmed understanding.

## 2022-04-16 ENCOUNTER — Encounter: Payer: Self-pay | Admitting: *Deleted

## 2022-04-16 ENCOUNTER — Other Ambulatory Visit: Payer: Self-pay | Admitting: Student

## 2022-04-16 ENCOUNTER — Telehealth: Payer: Self-pay | Admitting: Cardiology

## 2022-04-16 DIAGNOSIS — M7989 Other specified soft tissue disorders: Secondary | ICD-10-CM

## 2022-04-16 DIAGNOSIS — R0609 Other forms of dyspnea: Secondary | ICD-10-CM

## 2022-04-18 ENCOUNTER — Encounter: Payer: Self-pay | Admitting: Vascular Surgery

## 2022-04-18 ENCOUNTER — Ambulatory Visit (INDEPENDENT_AMBULATORY_CARE_PROVIDER_SITE_OTHER): Payer: Medicare PPO | Admitting: Vascular Surgery

## 2022-04-18 DIAGNOSIS — I6523 Occlusion and stenosis of bilateral carotid arteries: Secondary | ICD-10-CM | POA: Diagnosis not present

## 2022-04-18 NOTE — Progress Notes (Signed)
    Virtual Visit via Telephone Note    I connected with [name] on 04/18/2022 using the [email or web address] by telephone and verified that I was speaking with the correct person using two identifiers. Patient was located at home and accompanied by his wife. I am located at my office.   The limitations of evaluation and management by telemedicine and the availability of in person appointments have been previously discussed with the patient and are documented in the patients chart. The patient expressed understanding and consented to proceed.  PCP: Kirstie Peri, MD   Chief Complaint: Asymptomatic carotid disease  History of Present Illness: Robert Baldwin is a 81 y.o. male with asymptomatic carotid disease.  I saw him in our office last week with a duplex suggesting critical carotid disease.  I recommended CT follow-up and he obtained this within the last week.  I am discussing this with him today by telephone.  Past Medical History:  Diagnosis Date   Atrial fibrillation (HCC)    Hyperlipidemia    Hypertension    Type 2 diabetes mellitus (HCC)     Past Surgical History:  Procedure Laterality Date   Cataract surgery     HERNIA REPAIR     YAG LASER APPLICATION Right 08/17/2016   Procedure: YAG LASER APPLICATION;  Surgeon: Susa Simmonds, MD;  Location: [facility];  Service: Ophthalmology;  Laterality: Right;    No outpatient medications have been marked as taking for the 04/18/22 encounter (Office Visit) with Amauri Keefe, Kristen Loader, MD.     Observations/Objective: CT scan was reviewed with the patient and his wife on the telephone.  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  He has extreme calcification.  He has normal internal carotid above the bifurcation and also normal common carotid below the bifurcation  Assessment and Plan:  I again discussed the significance of this as I did in person last week.  I explained that his critical stenosis puts him at approximately 5 %/year risk of  neurologic event from each carotid artery.  I would recommend endarterectomy for reduction of stroke risk.  He is right-handed and therefore would proceed with left carotid endarterectomy and then followed with a staged right carotid endarterectomy.  He understands that this would be done in [place] at [facility] with one of my partners.  I did review his images today with Dr. Clotilde Dieter and discussed his case.  He is in agreement with this plan.  The patient and his wife both report that he is having improved breathing after recent treatment with Lasix.  He is scheduled to have a EKG through Dr. Ival Bible office in the coming week.  We will ask for formal cardiac clearance with Dr. Diona Browner with his recent event of some increased shortness of breath and effusion seen on CT scan.  Following cardiac clearance we will schedule left carotid      I discussed the assessment and treatment plan with the patient. The patient was provided an opportunity to ask questions and all were answered. The patient agreed with the plan and demonstrated an understanding of the instructions.   The patient was advised to call back or seek an in-person evaluation if the symptoms worsen or if the condition fails to improve as anticipated.  I spent 12 minutes with the patient via telephone encounter.   Milagros Loll [facility] [place] Office: [phone]  04/18/2022, 4:39 PM

## 2022-04-19 ENCOUNTER — Telehealth: Payer: Self-pay | Admitting: Cardiology

## 2022-04-19 DIAGNOSIS — K8020 Calculus of gallbladder without cholecystitis without obstruction: Secondary | ICD-10-CM | POA: Diagnosis not present

## 2022-04-19 DIAGNOSIS — D7389 Other diseases of spleen: Secondary | ICD-10-CM | POA: Diagnosis not present

## 2022-04-19 DIAGNOSIS — R109 Unspecified abdominal pain: Secondary | ICD-10-CM | POA: Diagnosis not present

## 2022-04-19 DIAGNOSIS — R932 Abnormal findings on diagnostic imaging of liver and biliary tract: Secondary | ICD-10-CM | POA: Diagnosis not present

## 2022-04-19 DIAGNOSIS — J90 Pleural effusion, not elsewhere classified: Secondary | ICD-10-CM | POA: Diagnosis not present

## 2022-04-19 DIAGNOSIS — R188 Other ascites: Secondary | ICD-10-CM | POA: Diagnosis not present

## 2022-04-19 NOTE — Telephone Encounter (Signed)
   Larita Fife with [facility] said, Dr. Tawanna Cooler Early spoke with DR. McDowell to get pt scheduled sooner

## 2022-04-19 NOTE — Telephone Encounter (Signed)
Will send to provider for clarification -   As of now - Echo is scheduled for 04/26/2022 & OV with Dr. Diona Browner scheduled for 06/14/2022.

## 2022-04-20 NOTE — Telephone Encounter (Signed)
Spoke with wife Babette Relic) - they will keep Thursday morning at 7:30 am in [place] for his Echo.

## 2022-04-25 ENCOUNTER — Ambulatory Visit: Payer: Medicare PPO | Admitting: Vascular Surgery

## 2022-04-26 ENCOUNTER — Ambulatory Visit: Payer: Medicare PPO

## 2022-04-26 ENCOUNTER — Other Ambulatory Visit: Payer: Self-pay | Admitting: Cardiology

## 2022-04-26 ENCOUNTER — Ambulatory Visit: Payer: Medicare PPO | Admitting: Cardiology

## 2022-04-26 ENCOUNTER — Telehealth: Payer: Self-pay | Admitting: Cardiology

## 2022-04-26 ENCOUNTER — Encounter: Payer: Self-pay | Admitting: Cardiology

## 2022-04-26 VITALS — BP 142/78 | HR 48 | Ht 67.0 in | Wt 210.6 lb

## 2022-04-26 DIAGNOSIS — I10 Essential (primary) hypertension: Secondary | ICD-10-CM | POA: Diagnosis not present

## 2022-04-26 DIAGNOSIS — I272 Pulmonary hypertension, unspecified: Secondary | ICD-10-CM | POA: Diagnosis not present

## 2022-04-26 DIAGNOSIS — Z01810 Encounter for preprocedural cardiovascular examination: Secondary | ICD-10-CM

## 2022-04-26 DIAGNOSIS — Z6835 Body mass index (BMI) 35.0-35.9, adult: Secondary | ICD-10-CM | POA: Diagnosis not present

## 2022-04-26 DIAGNOSIS — Z299 Encounter for prophylactic measures, unspecified: Secondary | ICD-10-CM | POA: Diagnosis not present

## 2022-04-26 DIAGNOSIS — I779 Disorder of arteries and arterioles, unspecified: Secondary | ICD-10-CM | POA: Diagnosis not present

## 2022-04-26 DIAGNOSIS — I4821 Permanent atrial fibrillation: Secondary | ICD-10-CM | POA: Diagnosis not present

## 2022-04-26 DIAGNOSIS — E1165 Type 2 diabetes mellitus with hyperglycemia: Secondary | ICD-10-CM | POA: Diagnosis not present

## 2022-04-26 DIAGNOSIS — M7989 Other specified soft tissue disorders: Secondary | ICD-10-CM

## 2022-04-26 DIAGNOSIS — I2510 Atherosclerotic heart disease of native coronary artery without angina pectoris: Secondary | ICD-10-CM | POA: Diagnosis not present

## 2022-04-26 DIAGNOSIS — K7460 Unspecified cirrhosis of liver: Secondary | ICD-10-CM | POA: Diagnosis not present

## 2022-04-26 DIAGNOSIS — R0609 Other forms of dyspnea: Secondary | ICD-10-CM

## 2022-04-26 DIAGNOSIS — I6523 Occlusion and stenosis of bilateral carotid arteries: Secondary | ICD-10-CM

## 2022-04-26 LAB — ECHOCARDIOGRAM COMPLETE
AR max vel: 1.63 cm2
AV Area VTI: 1.68 cm2
AV Area mean vel: 1.69 cm2
AV Mean grad: 6.7 mmHg
AV Peak grad: 13.4 mmHg
Ao pk vel: 1.83 m/s
Area-P 1/2: 3.88 cm2
Calc EF: 81 %
MV M vel: 3.5 m/s
MV Peak grad: 49 mmHg
S' Lateral: 2 cm
Single Plane A2C EF: 74.5 %
Single Plane A4C EF: 85.2 %

## 2022-04-26 MED ORDER — SODIUM CHLORIDE 0.9% FLUSH: 3.0000 mL | Freq: Two times a day (BID) | INTRAVENOUS | Status: DC

## 2022-04-26 NOTE — H&P (View-Only) (Signed)
Cardiology Office Note  Date: 04/26/2022   ID: [name], [name] [birth year], MRN [id number]  PCP:  Kirstie Peri, MD  Cardiologist:  [name], MD Electrophysiologist:  None   Chief Complaint  Patient presents with   Cardiac follow-up    History of Present Illness: Robert Baldwin is an 81 y.o. male last seen in February.  I added him on to clinic today in the setting of preoperative cardiac evaluation and to review recent echocardiogram and symptoms.  He was recently evaluated by Dr. Arbie Cookey was VVS due to documentation of critical bilateral ICA stenosis with plan for left carotid endarterectomy followed by staged right carotid endarterectomy.  Surgery has not yet been scheduled.  He has NYHA class III dyspnea, better on Lasix in the interim.  No dizziness or syncope, no exertional chest pain.  Peripheral edema present as well.  He has also had some abdominal bloating and underwent an abdominal ultrasound per Dr. [name] with findings noted below.  He is on Coumadin with follow-up by Dr. [name].  Follow-up echocardiogram today shows vigorous LVEF at 70 to 75% with LV septal flattening consistent with both RV pressure and volume overload, moderately dilated right ventricle with reduced contraction and severe pulmonary hypertension, estimated RVSP 120 mmHg, severe tricuspid regurgitation, pericardial effusion that is small to moderate, and sclerotic to mildly stenotic aortic valve.  His prior echocardiogram at [facility] in February indicated moderate tricuspid regurgitation but there was not an accurate assessment of RVSP.  I personally reviewed his ECG today which shows slow atrial fibrillation, right bundle branch block, nonspecific ST changes.  Today we discussed his symptoms and echocardiogram results.  He is going to be high risk for vascular surgery and needs further invasive cardiac evaluation to help determine if any of this risk is modifiable.  We discussed the risk and  benefits of a right and left heart catheterization and he is in agreement to proceed.  Past Medical History:  Diagnosis Date   Atrial fibrillation (HCC)    Hyperlipidemia    Hypertension    Type 2 diabetes mellitus (HCC)     Past Surgical History:  Procedure Laterality Date   Cataract surgery     HERNIA REPAIR     YAG LASER APPLICATION Right 08/17/2016   Procedure: YAG LASER APPLICATION;  Surgeon: Susa Simmonds, MD;  Location: [facility];  Service: Ophthalmology;  Laterality: Right;    Current Outpatient Medications  Medication Sig Dispense Refill   Ascorbic Acid (VITAMIN C) 500 MG tablet Take 500 mg by mouth daily.       aspirin EC 81 MG tablet Take 81 mg by mouth daily.     atorvastatin (LIPITOR) 40 MG tablet Take 40 mg by mouth daily.       chlorthalidone (HYGROTON) 25 MG tablet Take 25 mg by mouth daily.       Cholecalciferol (VITAMIN D3 PO) Take 1 tablet by mouth daily.     fish oil-omega-3 fatty acids 1000 MG capsule Take 1 g by mouth 2 (two) times daily.     furosemide (LASIX) 20 MG tablet TAKE 1 TABLET BY MOUTH EVERY DAY 30 tablet 1   glipiZIDE (GLUCOTROL) 10 MG tablet Take 10 mg by mouth daily before breakfast.     metFORMIN (GLUCOPHAGE) 1000 MG tablet Take 1,500 mg by mouth every morning. & 1000 mg in the evening     Multiple Vitamins-Minerals (MULTIVITAMIN WITH MINERALS) tablet Take 1 tablet by mouth in the  morning.     niacin 250 MG tablet Take 250 mg by mouth daily with breakfast.     warfarin (COUMADIN) 5 MG tablet Take 2.5-7.5 mg by mouth daily. Managed by Dr. Shah's office Monday, Friday, & Saturday 7.5 mg,  Tuesday,Thursday, & Sunday- 5 mg,  Wednesday-2.5 mg     amLODipine (NORVASC) 5 MG tablet Take 5 mg by mouth in the morning.     JANUVIA 100 MG tablet Take 100 mg by mouth in the morning.     losartan (COZAAR) 100 MG tablet Take 100 mg by mouth in the morning.     No current facility-administered medications for this visit.   Allergies:  Patient has no known  allergies.   Social History: The patient  reports that he has never smoked. He has never used smokeless tobacco. He reports that he does not drink alcohol and does not use drugs.   Family History: The patient's family history includes Heart disease in an other family member; Stroke in an other family member.   ROS: No syncope, otherwise per HPI.  Physical Exam: VS:  BP (!) 142/78   Pulse (!) 48   Ht 5' 7" (1.702 m)   Wt 210 lb 9.6 oz (95.5 kg)   BMI 32.98 kg/m , BMI Body mass index is 32.98 kg/m.  Wt Readings from Last 3 Encounters:  04/26/22 210 lb 9.6 oz (95.5 kg)  04/11/22 216 lb 12.8 oz (98.3 kg)  12/07/21 207 lb 9.6 oz (94.2 kg)    General: Patient appears comfortable at rest. HEENT: Conjunctiva and lids normal, oropharynx clear. Neck: Supple, no elevated JVP, bilateral carotid bruits, no thyromegaly. Lungs: Decreased breath sounds at the bases, nonlabored breathing at rest. Cardiac: Irregularly irregular, no S3, 1/6 apical systolic murmur, no pericardial rub. Abdomen: Protuberant, bowel sounds present, no guarding or rebound. Extremities: 2+ lower leg edema, distal pulses 2+. Skin: Warm and dry. Musculoskeletal: No kyphosis. Neuropsychiatric: Alert and oriented x3, affect grossly appropriate.  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  Recent Labwork: 04/12/2022: Creatinine, Ser 1.10   Other Studies Reviewed Today:  Abdominal US 04/19/2022: 1. Cholelithiasis without wall thickening, pericholecystic fluid, or  Murphy's sign.  2. The liver demonstrates a mildly nodular contour raising the  possibility of cirrhosis. Recommend clinical correlation.  3. Ascites and trace right pleural effusion.  4. Subjective cortical thinning associated with the kidneys.  5. Calcifications in the spleen may represent granulomas are sites  of previous trauma or infection.  6. No other significant  abnormalities.   Echocardiogram 04/26/2022:  1. Left ventricular ejection fraction, by estimation, is 70 to 75%. The  left ventricle has hyperdynamic function. The left ventricle has no  regional wall motion abnormalities. There is mild left ventricular  hypertrophy. Left ventricular diastolic  parameters are indeterminate. There is the interventricular septum is  flattened in systole and diastole, consistent with right ventricular  pressure and volume overload. The average left ventricular global  longitudinal strain is -24.0 %. The global  longitudinal strain is normal.   2. Right ventricular systolic function is moderately reduced. The right  ventricular size is moderately enlarged. Mildly increased right  ventricular wall thickness. There is severely elevated pulmonary artery  systolic pressure. The estimated right  ventricular systolic pressure is 120.3 mmHg.   3. Left atrial size was moderately dilated.   4. Right atrial size was severely dilated.   5. Right pleural   effusion noted. Moderate pericardial effusion. The  pericardial effusion is posterior to the left ventricle and localized near  the right atrium. Small collection anterolaterally.   6. The mitral valve is abnormal. Trivial mitral valve regurgitation.  Moderate mitral annular calcification.   7. Tricuspid valve regurgitation is severe.   8. The aortic valve is tricuspid. Aortic valve regurgitation is not  visualized. Aortic valve sclerosis/calcification is present, without any  evidence of aortic stenosis. Aortic valve mean gradient measures 6.7 mmHg.   9. The inferior vena cava is dilated in size with <50% respiratory  variability, suggesting right atrial pressure of 15 mmHg.   Assessment and Plan:  1.  Preoperative cardiac evaluation with plan for carotid endarterectomy under general anesthesia per recent evaluation by Dr. Arbie Cookey.  Patient has critical, bilateral ICA stenosis and is asymptomatic at this time.  He is  high risk for surgery as per above discussion and will require further cardiac testing and optimization.  2.  Severe pulmonary hypertension, today's echocardiogram estimates RVSP at 120 mmHg and he has associated RV dysfunction and severe right atrial enlargement.  Also severe tricuspid regurgitation, small to moderate pericardial effusion.  Plan is to proceed with a right and left heart catheterization for further assessment in terms of WHO group and importantly any potential for treatment that may help to reduce symptoms and also his surgical risk.  Risks and benefits discussed, and he is agreement to proceed.  He has been on Coumadin long-term in the setting of permanent atrial fibrillation which at least makes the likelihood of CTEPH less likely.  Also very much at risk for OSA which could be a large contributor.  I would anticipate that he will need to be admitted after his cardiac catheterization for further optimization by the heart failure team, vascular surgery can be consulted for follow-up at that time, and he will also be off Coumadin at that point.  3.  Permanent atrial fibrillation with CHA2DS2-VASc score of 5.  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  This has been fairly stable for quite some time, no syncope or dizziness.  He has been on Coumadin under the direction of his PCP Dr. [name] for stroke prophylaxis.  4.  Critical bilateral ICA stenosis by recent follow-up with Dr. Arbie Cookey.  Recommendation is for staged carotid endarterectomy if able.  He is asymptomatic and on Lipitor as well as low-dose aspirin.  5.  Recent work-up by PCP for abdominal bloating.  He does have cholelithiasis by ultrasound but not necessarily acute.  Nodular appearance of the liver raises possibility of cirrhosis and he also had ascites present.  Question whether this is related more so to his pulmonary hypertension.  Medication  Adjustments/Labs and Tests Ordered: Current medicines are reviewed at length with the patient today.  Concerns regarding medicines are outlined above.   Tests Ordered: Orders Placed This Encounter  Procedures   CBC   Basic metabolic panel   EKG 12-Lead    Medication Changes: No orders of the defined types were placed in this encounter.   Disposition:  Follow up  after procedure.  Signed, Jonelle Sidle, MD, [facility] 04/26/2022 9:26 AM    [facility] [address] [place], [place], [place] [address] Phone: [phone]; Fax: [phone]

## 2022-04-26 NOTE — Patient Instructions (Addendum)
Medication Instructions:  Your physician recommends that you continue on your current medications as directed. Please refer to the Current Medication list given to you today.   Labwork: BMET, CBC (today at [facility])  Testing/Procedures: Your physician has requested that you have a cardiac catheterization. Cardiac catheterization is used to diagnose and/or treat various heart conditions. Doctors may recommend this procedure for a number of different reasons. The most common reason is to evaluate chest pain. Chest pain can be a symptom of coronary artery disease (CAD), and cardiac catheterization can show whether plaque is narrowing or blocking your heart's arteries. This procedure is also used to evaluate the valves, as well as measure the blood flow and oxygen levels in different parts of your heart. For further information please visit [email or web address] Please follow instruction sheet, as given.   Follow-Up:  Your physician recommends that you schedule a follow-up appointment in: 4-6 weeks   Any Other Special Instructions Will Be Listed Below (If Applicable).  If you need a refill on your cardiac medications before your next appointment, please call your pharmacy.

## 2022-04-26 NOTE — Telephone Encounter (Signed)
Pre cert Rt Lt Cath Dx: Severe pulmonary hypertension (Monday 7/10 @ 7:30 am with Dr. Okey Dupre)

## 2022-04-26 NOTE — Progress Notes (Signed)
Cardiology Office Note  Date: 04/26/2022   ID: Robert Baldwin [birth year], MRN [id number]  PCP:  Kirstie Peri, MD  Cardiologist:  [name], MD Electrophysiologist:  None   Chief Complaint  Patient presents with   Cardiac follow-up    History of Present Illness: Robert Baldwin is an 81 y.o. male last seen in February.  I added him on to clinic today in the setting of preoperative cardiac evaluation and to review recent echocardiogram and symptoms.  He was recently evaluated by Dr. Arbie Baldwin was VVS due to documentation of critical bilateral ICA stenosis with plan for left carotid endarterectomy followed by staged right carotid endarterectomy.  Surgery has not yet been scheduled.  He has NYHA class III dyspnea, better on Lasix in the interim.  No dizziness or syncope, no exertional chest pain.  Peripheral edema present as well.  He has also had some abdominal bloating and underwent an abdominal ultrasound per Dr. Sherryll Baldwin with findings noted below.  He is on Coumadin with follow-up by Dr. Sherryll Baldwin.  Follow-up echocardiogram today shows vigorous LVEF at 70 to 75% with LV septal flattening consistent with both RV pressure and volume overload, moderately dilated right ventricle with reduced contraction and severe pulmonary hypertension, estimated RVSP 120 mmHg, severe tricuspid regurgitation, pericardial effusion that is small to moderate, and sclerotic to mildly stenotic aortic valve.  His prior echocardiogram at [facility] in February indicated moderate tricuspid regurgitation but there was not an accurate assessment of RVSP.  I personally reviewed his ECG today which shows slow atrial fibrillation, right bundle branch block, nonspecific ST changes.  Today we discussed his symptoms and echocardiogram results.  He is going to be high risk for vascular surgery and needs further invasive cardiac evaluation to help determine if any of this risk is modifiable.  We discussed the risk and  benefits of a right and left heart catheterization and he is in agreement to proceed.  Past Medical History:  Diagnosis Date   Atrial fibrillation (HCC)    Hyperlipidemia    Hypertension    Type 2 diabetes mellitus (HCC)     Past Surgical History:  Procedure Laterality Date   Cataract surgery     HERNIA REPAIR     YAG LASER APPLICATION Right 08/17/2016   Procedure: YAG LASER APPLICATION;  Surgeon: Susa Simmonds, MD;  Location: [facility];  Service: Ophthalmology;  Laterality: Right;    Current Outpatient Medications  Medication Sig Dispense Refill   Ascorbic Acid (VITAMIN C) 500 MG tablet Take 500 mg by mouth daily.       aspirin EC 81 MG tablet Take 81 mg by mouth daily.     atorvastatin (LIPITOR) 40 MG tablet Take 40 mg by mouth daily.       chlorthalidone (HYGROTON) 25 MG tablet Take 25 mg by mouth daily.       Cholecalciferol (VITAMIN D3 PO) Take 1 tablet by mouth daily.     fish oil-omega-3 fatty acids 1000 MG capsule Take 1 g by mouth 2 (two) times daily.     furosemide (LASIX) 20 MG tablet TAKE 1 TABLET BY MOUTH EVERY DAY 30 tablet 1   glipiZIDE (GLUCOTROL) 10 MG tablet Take 10 mg by mouth daily before breakfast.     metFORMIN (GLUCOPHAGE) 1000 MG tablet Take 1,500 mg by mouth every morning. & 1000 mg in the evening     Multiple Vitamins-Minerals (MULTIVITAMIN WITH MINERALS) tablet Take 1 tablet by mouth in the  morning.     niacin 250 MG tablet Take 250 mg by mouth daily with breakfast.     warfarin (COUMADIN) 5 MG tablet Take 2.5-7.5 mg by mouth daily. Managed by Dr. Margaretmary Baldwin office Monday, Friday, & Saturday 7.5 mg,  Tuesday,Thursday, & Sunday- 5 mg,  Wednesday-2.5 mg     amLODipine (NORVASC) 5 MG tablet Take 5 mg by mouth in the morning.     JANUVIA 100 MG tablet Take 100 mg by mouth in the morning.     losartan (COZAAR) 100 MG tablet Take 100 mg by mouth in the morning.     No current facility-administered medications for this visit.   Allergies:  Patient has no known  allergies.   Social History: The patient  reports that he has never smoked. He has never used smokeless tobacco. He reports that he does not drink alcohol and does not use drugs.   Family History: The patient's family history includes Heart disease in an other family member; Stroke in an other family member.   ROS: No syncope, otherwise per HPI.  Physical Exam: VS:  BP (!) 142/78   Pulse (!) 48   Ht 5\' 7"  (1.702 m)   Wt 210 lb 9.6 oz (95.5 kg)   BMI 32.98 kg/m , BMI Body mass index is 32.98 kg/m.  Wt Readings from Last 3 Encounters:  04/26/22 210 lb 9.6 oz (95.5 kg)  04/11/22 216 lb 12.8 oz (98.3 kg)  12/07/21 207 lb 9.6 oz (94.2 kg)    General: Patient appears comfortable at rest. HEENT: Conjunctiva and lids normal, oropharynx clear. Neck: Supple, no elevated JVP, bilateral carotid bruits, no thyromegaly. Lungs: Decreased breath sounds at the bases, nonlabored breathing at rest. Cardiac: Irregularly irregular, no S3, 1/6 apical systolic murmur, no pericardial rub. Abdomen: Protuberant, bowel sounds present, no guarding or rebound. Extremities: 2+ lower leg edema, distal pulses 2+. Skin: Warm and dry. Musculoskeletal: No kyphosis. Neuropsychiatric: Alert and oriented x3, affect grossly appropriate.  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  Recent Labwork: 04/12/2022: Creatinine, Ser 1.10   Other Studies Reviewed Today:  Abdominal 04/14/2022 04/19/2022: 1. Cholelithiasis without wall thickening, pericholecystic fluid, or  Murphy's sign.  2. The liver demonstrates a mildly nodular contour raising the  possibility of cirrhosis. Recommend clinical correlation.  3. Ascites and trace right pleural effusion.  4. Subjective cortical thinning associated with the kidneys.  5. Calcifications in the spleen may represent granulomas are sites  of previous trauma or infection.  6. No other significant  abnormalities.   Echocardiogram 04/26/2022:  1. Left ventricular ejection fraction, by estimation, is 70 to 75%. The  left ventricle has hyperdynamic function. The left ventricle has no  regional wall motion abnormalities. There is mild left ventricular  hypertrophy. Left ventricular diastolic  parameters are indeterminate. There is the interventricular septum is  flattened in systole and diastole, consistent with right ventricular  pressure and volume overload. The average left ventricular global  longitudinal strain is -24.0 %. The global  longitudinal strain is normal.   2. Right ventricular systolic function is moderately reduced. The right  ventricular size is moderately enlarged. Mildly increased right  ventricular wall thickness. There is severely elevated pulmonary artery  systolic pressure. The estimated right  ventricular systolic pressure is 120.3 mmHg.   3. Left atrial size was moderately dilated.   4. Right atrial size was severely dilated.   5. Right pleural  effusion noted. Moderate pericardial effusion. The  pericardial effusion is posterior to the left ventricle and localized near  the right atrium. Small collection anterolaterally.   6. The mitral valve is abnormal. Trivial mitral valve regurgitation.  Moderate mitral annular calcification.   7. Tricuspid valve regurgitation is severe.   8. The aortic valve is tricuspid. Aortic valve regurgitation is not  visualized. Aortic valve sclerosis/calcification is present, without any  evidence of aortic stenosis. Aortic valve mean gradient measures 6.7 mmHg.   9. The inferior vena cava is dilated in size with <50% respiratory  variability, suggesting right atrial pressure of 15 mmHg.   Assessment and Plan:  1.  Preoperative cardiac evaluation with plan for carotid endarterectomy under general anesthesia per recent evaluation by Dr. Arbie Baldwin.  Patient has critical, bilateral ICA stenosis and is asymptomatic at this time.  He is  high risk for surgery as per above discussion and will require further cardiac testing and optimization.  2.  Severe pulmonary hypertension, today's echocardiogram estimates RVSP at 120 mmHg and he has associated RV dysfunction and severe right atrial enlargement.  Also severe tricuspid regurgitation, small to moderate pericardial effusion.  Plan is to proceed with a right and left heart catheterization for further assessment in terms of WHO group and importantly any potential for treatment that may help to reduce symptoms and also his surgical risk.  Risks and benefits discussed, and he is agreement to proceed.  He has been on Coumadin long-term in the setting of permanent atrial fibrillation which at least makes the likelihood of CTEPH less likely.  Also very much at risk for OSA which could be a large contributor.  I would anticipate that he will need to be admitted after his cardiac catheterization for further optimization by the heart failure team, vascular surgery can be consulted for follow-up at that time, and he will also be off Coumadin at that point.  3.  Permanent atrial fibrillation with CHA2DS2-VASc score of 5.  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  This has been fairly stable for quite some time, no syncope or dizziness.  He has been on Coumadin under the direction of his PCP Dr. Sherryll Baldwin for stroke prophylaxis.  4.  Critical bilateral ICA stenosis by recent follow-up with Dr. Arbie Baldwin.  Recommendation is for staged carotid endarterectomy if able.  He is asymptomatic and on Lipitor as well as low-dose aspirin.  5.  Recent work-up by PCP for abdominal bloating.  He does have cholelithiasis by ultrasound but not necessarily acute.  Nodular appearance of the liver raises possibility of cirrhosis and he also had ascites present.  Question whether this is related more so to his pulmonary hypertension.  Medication  Adjustments/Labs and Tests Ordered: Current medicines are reviewed at length with the patient today.  Concerns regarding medicines are outlined above.   Tests Ordered: Orders Placed This Encounter  Procedures   CBC   Basic metabolic panel   EKG 12-Lead    Medication Changes: No orders of the defined types were placed in this encounter.   Disposition:  Follow up  after procedure.  Signed, Jonelle Sidle, MD, [facility] 04/26/2022 9:26 AM    [facility] [address] [place], [place], [place] [address] Phone: [phone]; Fax: [phone]

## 2022-04-27 ENCOUNTER — Telehealth: Payer: Self-pay

## 2022-04-27 NOTE — Telephone Encounter (Addendum)
Per DPR, spoke to wife Robert Baldwin and made her aware of changes to medications that need to be held per lab results. Informed her that chlorthalidone, furosemide, januvia, losartan and metformin needs to be held the day of procedure and that the metformin will need to be held for 2 days post procedure. Coumadin is to be held starting Thursday 7/6. States that the patient has not been on glipizide for years and that Dr. [name] recently added Actos but informed patient not to take until further notice. After reviewing medication list with wife, all other medications are the same.

## 2022-04-30 ENCOUNTER — Other Ambulatory Visit (HOSPITAL_COMMUNITY): Payer: Self-pay

## 2022-04-30 ENCOUNTER — Inpatient Hospital Stay (HOSPITAL_COMMUNITY)
Admission: AD | Disposition: A | Discharge status: Home Health | Payer: Self-pay | Source: Home / Self Care | Attending: Cardiology

## 2022-04-30 ENCOUNTER — Inpatient Hospital Stay (HOSPITAL_COMMUNITY)
Admission: AD | Admit: 2022-04-30 | Discharge: 2022-05-08 | DRG: 286 | Disposition: A | Discharge status: Home Health | Payer: Medicare PPO | Attending: Cardiology | Admitting: Cardiology

## 2022-04-30 ENCOUNTER — Encounter (HOSPITAL_COMMUNITY): Payer: Self-pay | Admitting: Cardiology

## 2022-04-30 ENCOUNTER — Other Ambulatory Visit: Payer: Self-pay

## 2022-04-30 ENCOUNTER — Telehealth (HOSPITAL_COMMUNITY): Payer: Self-pay | Admitting: Pharmacy Technician

## 2022-04-30 DIAGNOSIS — Z01812 Encounter for preprocedural laboratory examination: Principal | ICD-10-CM

## 2022-04-30 DIAGNOSIS — R0902 Hypoxemia: Secondary | ICD-10-CM | POA: Diagnosis present

## 2022-04-30 DIAGNOSIS — I119 Hypertensive heart disease without heart failure: Secondary | ICD-10-CM | POA: Diagnosis not present

## 2022-04-30 DIAGNOSIS — I083 Combined rheumatic disorders of mitral, aortic and tricuspid valves: Secondary | ICD-10-CM | POA: Diagnosis not present

## 2022-04-30 DIAGNOSIS — R0989 Other specified symptoms and signs involving the circulatory and respiratory systems: Secondary | ICD-10-CM | POA: Diagnosis present

## 2022-04-30 DIAGNOSIS — I270 Primary pulmonary hypertension: Secondary | ICD-10-CM | POA: Diagnosis not present

## 2022-04-30 DIAGNOSIS — I5033 Acute on chronic diastolic (congestive) heart failure: Secondary | ICD-10-CM | POA: Diagnosis present

## 2022-04-30 DIAGNOSIS — J9811 Atelectasis: Secondary | ICD-10-CM | POA: Diagnosis not present

## 2022-04-30 DIAGNOSIS — I724 Aneurysm of artery of lower extremity: Secondary | ICD-10-CM | POA: Diagnosis not present

## 2022-04-30 DIAGNOSIS — I2721 Secondary pulmonary arterial hypertension: Secondary | ICD-10-CM | POA: Diagnosis present

## 2022-04-30 DIAGNOSIS — I50813 Acute on chronic right heart failure: Secondary | ICD-10-CM | POA: Diagnosis not present

## 2022-04-30 DIAGNOSIS — E785 Hyperlipidemia, unspecified: Secondary | ICD-10-CM | POA: Diagnosis present

## 2022-04-30 DIAGNOSIS — I6523 Occlusion and stenosis of bilateral carotid arteries: Secondary | ICD-10-CM | POA: Diagnosis present

## 2022-04-30 DIAGNOSIS — E1122 Type 2 diabetes mellitus with diabetic chronic kidney disease: Secondary | ICD-10-CM | POA: Diagnosis not present

## 2022-04-30 DIAGNOSIS — K8020 Calculus of gallbladder without cholecystitis without obstruction: Secondary | ICD-10-CM | POA: Diagnosis present

## 2022-04-30 DIAGNOSIS — I97630 Postprocedural hematoma of a circulatory system organ or structure following a cardiac catheterization: Secondary | ICD-10-CM | POA: Diagnosis not present

## 2022-04-30 DIAGNOSIS — I700 Atherosclerosis of aorta: Secondary | ICD-10-CM | POA: Diagnosis not present

## 2022-04-30 DIAGNOSIS — I5082 Biventricular heart failure: Secondary | ICD-10-CM | POA: Diagnosis present

## 2022-04-30 DIAGNOSIS — R001 Bradycardia, unspecified: Secondary | ICD-10-CM | POA: Diagnosis present

## 2022-04-30 DIAGNOSIS — I130 Hypertensive heart and chronic kidney disease with heart failure and stage 1 through stage 4 chronic kidney disease, or unspecified chronic kidney disease: Secondary | ICD-10-CM | POA: Diagnosis not present

## 2022-04-30 DIAGNOSIS — I272 Pulmonary hypertension, unspecified: Secondary | ICD-10-CM | POA: Diagnosis not present

## 2022-04-30 DIAGNOSIS — I4510 Unspecified right bundle-branch block: Secondary | ICD-10-CM | POA: Diagnosis present

## 2022-04-30 DIAGNOSIS — Z7901 Long term (current) use of anticoagulants: Secondary | ICD-10-CM

## 2022-04-30 DIAGNOSIS — I3139 Other pericardial effusion (noninflammatory): Secondary | ICD-10-CM | POA: Diagnosis not present

## 2022-04-30 DIAGNOSIS — R791 Abnormal coagulation profile: Secondary | ICD-10-CM | POA: Diagnosis present

## 2022-04-30 DIAGNOSIS — I2510 Atherosclerotic heart disease of native coronary artery without angina pectoris: Secondary | ICD-10-CM | POA: Diagnosis not present

## 2022-04-30 DIAGNOSIS — Z7984 Long term (current) use of oral hypoglycemic drugs: Secondary | ICD-10-CM

## 2022-04-30 DIAGNOSIS — D62 Acute posthemorrhagic anemia: Secondary | ICD-10-CM | POA: Diagnosis not present

## 2022-04-30 DIAGNOSIS — Z7982 Long term (current) use of aspirin: Secondary | ICD-10-CM | POA: Diagnosis not present

## 2022-04-30 DIAGNOSIS — Y840 Cardiac catheterization as the cause of abnormal reaction of the patient, or of later complication, without mention of misadventure at the time of the procedure: Secondary | ICD-10-CM | POA: Diagnosis not present

## 2022-04-30 DIAGNOSIS — N179 Acute kidney failure, unspecified: Secondary | ICD-10-CM | POA: Diagnosis present

## 2022-04-30 DIAGNOSIS — N1831 Chronic kidney disease, stage 3a: Secondary | ICD-10-CM | POA: Diagnosis present

## 2022-04-30 DIAGNOSIS — I10 Essential (primary) hypertension: Secondary | ICD-10-CM | POA: Diagnosis not present

## 2022-04-30 DIAGNOSIS — Z79899 Other long term (current) drug therapy: Secondary | ICD-10-CM

## 2022-04-30 DIAGNOSIS — I4821 Permanent atrial fibrillation: Secondary | ICD-10-CM | POA: Diagnosis present

## 2022-04-30 DIAGNOSIS — Z8249 Family history of ischemic heart disease and other diseases of the circulatory system: Secondary | ICD-10-CM | POA: Diagnosis not present

## 2022-04-30 DIAGNOSIS — D509 Iron deficiency anemia, unspecified: Secondary | ICD-10-CM | POA: Diagnosis not present

## 2022-04-30 DIAGNOSIS — J929 Pleural plaque without asbestos: Secondary | ICD-10-CM | POA: Diagnosis not present

## 2022-04-30 DIAGNOSIS — I509 Heart failure, unspecified: Secondary | ICD-10-CM | POA: Diagnosis not present

## 2022-04-30 HISTORY — PX: RIGHT/LEFT HEART CATH AND CORONARY ANGIOGRAPHY: CATH118266

## 2022-04-30 LAB — COMPREHENSIVE METABOLIC PANEL
ALT: 17 U/L (ref 0–44)
AST: 35 U/L (ref 15–41)
Albumin: 3.4 g/dL — ABNORMAL LOW (ref 3.5–5.0)
Alkaline Phosphatase: 76 U/L (ref 38–126)
Anion gap: 10 (ref 5–15)
BUN: 25 mg/dL — ABNORMAL HIGH (ref 8–23)
CO2: 25 mmol/L (ref 22–32)
Calcium: 9.1 mg/dL (ref 8.9–10.3)
Chloride: 105 mmol/L (ref 98–111)
Creatinine, Ser: 1.13 mg/dL (ref 0.61–1.24)
GFR, Estimated: >60 mL/min (ref >60)
Glucose, Bld: 109 mg/dL — ABNORMAL HIGH (ref 70–99)
Potassium: 4.8 mmol/L (ref 3.5–5.1)
Sodium: 140 mmol/L (ref 135–145)
Total Bilirubin: 1.5 mg/dL — ABNORMAL HIGH (ref 0.3–1.2)
Total Protein: 6.6 g/dL (ref 6.5–8.1)

## 2022-04-30 LAB — GLUCOSE, CAPILLARY
Glucose-Capillary: 114 mg/dL — ABNORMAL HIGH (ref 70–99)
Glucose-Capillary: 126 mg/dL — ABNORMAL HIGH (ref 70–99)
Glucose-Capillary: 112 mg/dL — ABNORMAL HIGH (ref 70–99)
Glucose-Capillary: 128 mg/dL — ABNORMAL HIGH (ref 70–99)
Glucose-Capillary: 160 mg/dL — ABNORMAL HIGH (ref 70–99)

## 2022-04-30 LAB — POCT I-STAT EG7
Acid-Base Excess: 1 mmol/L (ref 0.0–2.0)
Bicarbonate: 26.7 mmol/L (ref 20.0–28.0)
Calcium, Ion: 1.22 mmol/L (ref 1.15–1.40)
HCT: 34 % — ABNORMAL LOW (ref 39.0–52.0)
Hemoglobin: 11.6 g/dL — ABNORMAL LOW (ref 13.0–17.0)
O2 Saturation: 70 %
Potassium: 4.2 mmol/L (ref 3.5–5.1)
Sodium: 143 mmol/L (ref 135–145)
TCO2: 28 mmol/L (ref 22–32)
pCO2, Ven: 45.9 mmHg (ref 44–60)
pH, Ven: 7.373 (ref 7.25–7.43)
pO2, Ven: 38 mmHg (ref 32–45)

## 2022-04-30 LAB — CBC WITH DIFFERENTIAL/PLATELET
Abs Immature Granulocytes: 0.05 10*3/uL (ref 0.00–0.07)
Basophils Absolute: 0 10*3/uL (ref 0.0–0.1)
Basophils Relative: 0 %
Eosinophils Absolute: 0.1 10*3/uL (ref 0.0–0.5)
Eosinophils Relative: 2 %
HCT: 34.1 % — ABNORMAL LOW (ref 39.0–52.0)
Hemoglobin: 11 g/dL — ABNORMAL LOW (ref 13.0–17.0)
Immature Granulocytes: 1 %
Lymphocytes Relative: 11 %
Lymphs Abs: 0.9 10*3/uL (ref 0.7–4.0)
MCH: 30.6 pg (ref 26.0–34.0)
MCHC: 32.3 g/dL (ref 30.0–36.0)
MCV: 95 fL (ref 80.0–100.0)
Monocytes Absolute: 1 10*3/uL (ref 0.1–1.0)
Monocytes Relative: 12 %
Neutro Abs: 5.8 10*3/uL (ref 1.7–7.7)
Neutrophils Relative %: 74 %
Platelets: 152 10*3/uL (ref 150–400)
RBC: 3.59 MIL/uL — ABNORMAL LOW (ref 4.22–5.81)
RDW: 16.7 % — ABNORMAL HIGH (ref 11.5–15.5)
WBC: 7.9 10*3/uL (ref 4.0–10.5)
nRBC: 0 % (ref 0.0–0.2)

## 2022-04-30 LAB — POCT I-STAT 7, (LYTES, BLD GAS, ICA,H+H)
Acid-Base Excess: 0 mmol/L (ref 0.0–2.0)
Bicarbonate: 25.1 mmol/L (ref 20.0–28.0)
Calcium, Ion: 1.23 mmol/L (ref 1.15–1.40)
HCT: 33 % — ABNORMAL LOW (ref 39.0–52.0)
Hemoglobin: 11.2 g/dL — ABNORMAL LOW (ref 13.0–17.0)
O2 Saturation: 97 %
Potassium: 4.3 mmol/L (ref 3.5–5.1)
Sodium: 143 mmol/L (ref 135–145)
TCO2: 26 mmol/L (ref 22–32)
pCO2 arterial: 41 mmHg (ref 32–48)
pH, Arterial: 7.395 (ref 7.35–7.45)
pO2, Arterial: 89 mmHg (ref 83–108)

## 2022-04-30 LAB — PROTIME-INR
INR: 1.5 — ABNORMAL HIGH (ref 0.8–1.2)
Prothrombin Time: 18.4 seconds — ABNORMAL HIGH (ref 11.4–15.2)

## 2022-04-30 LAB — HEMOGLOBIN A1C
Hgb A1c MFr Bld: 5.8 % — ABNORMAL HIGH (ref 4.8–5.6)
Mean Plasma Glucose: 119.76 mg/dL

## 2022-04-30 LAB — BRAIN NATRIURETIC PEPTIDE: B Natriuretic Peptide: 689.4 pg/mL — ABNORMAL HIGH (ref 0.0–100.0)

## 2022-04-30 LAB — MAGNESIUM: Magnesium: 1.8 mg/dL (ref 1.7–2.4)

## 2022-04-30 SURGERY — RIGHT/LEFT HEART CATH AND CORONARY ANGIOGRAPHY
Anesthesia: LOCAL

## 2022-04-30 MED ORDER — LINAGLIPTIN 5 MG PO TABS
5.0000 mg | ORAL_TABLET | Freq: Every day | ORAL | Status: DC
  Administered 2022-05-01 – 2022-05-08 (×8): 5 mg via ORAL
  Filled 2022-04-30 (×8): qty 1

## 2022-04-30 MED ORDER — SODIUM CHLORIDE 0.9 % IV SOLN: 250.0000 mL | INTRAVENOUS | Status: DC | PRN

## 2022-04-30 MED ORDER — SODIUM CHLORIDE 0.9% FLUSH
3.0000 mL | Freq: Two times a day (BID) | INTRAVENOUS | Status: DC
  Administered 2022-04-30 – 2022-05-07 (×14): 3 mL via INTRAVENOUS

## 2022-04-30 MED ORDER — WARFARIN - PHARMACIST DOSING INPATIENT: Freq: Every day | Status: DC

## 2022-04-30 MED ORDER — MAGNESIUM SULFATE 2 GM/50ML IV SOLN
2.0000 g | Freq: Once | INTRAVENOUS | Status: AC
  Administered 2022-04-30: 2 g via INTRAVENOUS
  Filled 2022-04-30: qty 50

## 2022-04-30 MED ORDER — SODIUM CHLORIDE 0.9% FLUSH
3.0000 mL | Freq: Two times a day (BID) | INTRAVENOUS | Status: DC
  Administered 2022-04-30 – 2022-05-08 (×10): 3 mL via INTRAVENOUS

## 2022-04-30 MED ORDER — SODIUM CHLORIDE 0.9 % WEIGHT BASED INFUSION
3.0000 mL/kg/h | INTRAVENOUS | Status: DC
  Administered 2022-04-30: 3 mL/kg/h via INTRAVENOUS

## 2022-04-30 MED ORDER — VITAMIN D 25 MCG (1000 UNIT) PO TABS
1000.0000 [IU] | ORAL_TABLET | Freq: Two times a day (BID) | ORAL | Status: DC
  Administered 2022-04-30 – 2022-05-08 (×16): 1000 [IU] via ORAL
  Filled 2022-04-30 (×20): qty 1

## 2022-04-30 MED ORDER — LIDOCAINE HCL (PF) 1 % IJ SOLN
INTRAMUSCULAR | Status: DC | PRN
  Administered 2022-04-30 (×2): 2 mL
  Administered 2022-04-30: 15 mL
  Administered 2022-04-30: 2 mL

## 2022-04-30 MED ORDER — ONDANSETRON HCL 4 MG/2ML IJ SOLN: 4.0000 mg | Freq: Four times a day (QID) | INTRAMUSCULAR | Status: DC | PRN

## 2022-04-30 MED ORDER — ATORVASTATIN CALCIUM 40 MG PO TABS
40.0000 mg | ORAL_TABLET | Freq: Every day | ORAL | Status: DC
  Administered 2022-04-30 – 2022-05-07 (×8): 40 mg via ORAL
  Filled 2022-04-30 (×8): qty 1

## 2022-04-30 MED ORDER — ACETAMINOPHEN 325 MG PO TABS: 650.0000 mg | ORAL_TABLET | ORAL | Status: DC | PRN

## 2022-04-30 MED ORDER — HEPARIN (PORCINE) IN NACL 1000-0.9 UT/500ML-% IV SOLN
INTRAVENOUS | Status: AC
  Filled 2022-04-30: qty 1000

## 2022-04-30 MED ORDER — IOHEXOL 350 MG/ML SOLN
INTRAVENOUS | Status: DC | PRN
  Administered 2022-04-30: 90 mL

## 2022-04-30 MED ORDER — MIDAZOLAM HCL 2 MG/2ML IJ SOLN
INTRAMUSCULAR | Status: AC
  Filled 2022-04-30: qty 2

## 2022-04-30 MED ORDER — OMEGA-3 FATTY ACIDS 1000 MG PO CAPS: 1.0000 g | ORAL_CAPSULE | Freq: Every evening | ORAL | Status: DC

## 2022-04-30 MED ORDER — SODIUM CHLORIDE 0.9 % WEIGHT BASED INFUSION: 1.0000 mL/kg/h | INTRAVENOUS | Status: DC

## 2022-04-30 MED ORDER — SODIUM CHLORIDE 0.9% FLUSH: 3.0000 mL | INTRAVENOUS | Status: DC | PRN

## 2022-04-30 MED ORDER — INSULIN ASPART 100 UNIT/ML IJ SOLN
0.0000 [IU] | Freq: Three times a day (TID) | INTRAMUSCULAR | Status: DC
  Administered 2022-04-30: 2 [IU] via SUBCUTANEOUS
  Administered 2022-05-01: 3 [IU] via SUBCUTANEOUS
  Administered 2022-05-01: 2 [IU] via SUBCUTANEOUS
  Administered 2022-05-02: 3 [IU] via SUBCUTANEOUS
  Administered 2022-05-02 (×2): 2 [IU] via SUBCUTANEOUS
  Administered 2022-05-03: 3 [IU] via SUBCUTANEOUS
  Administered 2022-05-03 (×2): 2 [IU] via SUBCUTANEOUS
  Administered 2022-05-04: 3 [IU] via SUBCUTANEOUS
  Administered 2022-05-04 – 2022-05-05 (×4): 2 [IU] via SUBCUTANEOUS
  Administered 2022-05-06: 3 [IU] via SUBCUTANEOUS
  Administered 2022-05-06: 2 [IU] via SUBCUTANEOUS
  Administered 2022-05-06 – 2022-05-07 (×2): 3 [IU] via SUBCUTANEOUS
  Administered 2022-05-07 – 2022-05-08 (×4): 2 [IU] via SUBCUTANEOUS

## 2022-04-30 MED ORDER — HYDRALAZINE HCL 20 MG/ML IJ SOLN
INTRAMUSCULAR | Status: AC
Start: 2022-04-30 — End: ?
  Filled 2022-04-30: qty 1

## 2022-04-30 MED ORDER — SPIRONOLACTONE 12.5 MG HALF TABLET
12.5000 mg | ORAL_TABLET | Freq: Every day | ORAL | Status: DC
  Administered 2022-04-30: 12.5 mg via ORAL
  Filled 2022-04-30: qty 1

## 2022-04-30 MED ORDER — ASPIRIN 81 MG PO CHEW: 81.0000 mg | CHEWABLE_TABLET | ORAL | Status: DC

## 2022-04-30 MED ORDER — FUROSEMIDE 10 MG/ML IJ SOLN
80.0000 mg | Freq: Two times a day (BID) | INTRAMUSCULAR | Status: DC
  Administered 2022-04-30 – 2022-05-02 (×5): 80 mg via INTRAVENOUS
  Filled 2022-04-30 (×5): qty 8

## 2022-04-30 MED ORDER — LIDOCAINE HCL (PF) 1 % IJ SOLN
INTRAMUSCULAR | Status: AC
  Filled 2022-04-30: qty 30

## 2022-04-30 MED ORDER — AMLODIPINE BESYLATE 5 MG PO TABS: 5.0000 mg | ORAL_TABLET | Freq: Every morning | ORAL | Status: DC

## 2022-04-30 MED ORDER — LOSARTAN POTASSIUM 50 MG PO TABS
100.0000 mg | ORAL_TABLET | Freq: Every morning | ORAL | Status: DC
  Administered 2022-05-01: 100 mg via ORAL
  Filled 2022-04-30: qty 2

## 2022-04-30 MED ORDER — MIDAZOLAM HCL 2 MG/2ML IJ SOLN
INTRAMUSCULAR | Status: DC | PRN
  Administered 2022-04-30: .5 mg via INTRAVENOUS

## 2022-04-30 MED ORDER — FENTANYL CITRATE (PF) 100 MCG/2ML IJ SOLN
INTRAMUSCULAR | Status: DC | PRN
  Administered 2022-04-30: 12.5 ug via INTRAVENOUS

## 2022-04-30 MED ORDER — FENTANYL CITRATE (PF) 100 MCG/2ML IJ SOLN
INTRAMUSCULAR | Status: AC
  Filled 2022-04-30: qty 2

## 2022-04-30 MED ORDER — VERAPAMIL HCL 2.5 MG/ML IV SOLN
INTRAVENOUS | Status: AC
  Filled 2022-04-30: qty 2

## 2022-04-30 MED ORDER — ASPIRIN 81 MG PO TBEC
81.0000 mg | DELAYED_RELEASE_TABLET | Freq: Every day | ORAL | Status: DC
Start: 2022-04-30 — End: 2022-05-02
  Administered 2022-04-30 – 2022-05-01 (×2): 81 mg via ORAL
  Filled 2022-04-30 (×2): qty 1

## 2022-04-30 MED ORDER — HEPARIN SODIUM (PORCINE) 1000 UNIT/ML IJ SOLN
INTRAMUSCULAR | Status: AC
Start: 2022-04-30 — End: ?
  Filled 2022-04-30: qty 10

## 2022-04-30 MED ORDER — HYDRALAZINE HCL 20 MG/ML IJ SOLN
10.0000 mg | INTRAMUSCULAR | Status: AC | PRN
  Administered 2022-04-30: 10 mg via INTRAVENOUS

## 2022-04-30 MED ORDER — INSULIN ASPART 100 UNIT/ML IJ SOLN: 0.0000 [IU] | Freq: Every day | INTRAMUSCULAR | Status: DC

## 2022-04-30 MED ORDER — WARFARIN SODIUM 7.5 MG PO TABS
7.5000 mg | ORAL_TABLET | Freq: Once | ORAL | Status: AC
  Administered 2022-04-30: 7.5 mg via ORAL
  Filled 2022-04-30: qty 1

## 2022-04-30 MED ORDER — VERAPAMIL HCL 2.5 MG/ML IV SOLN
INTRAVENOUS | Status: DC | PRN
  Administered 2022-04-30: 10 mL via INTRA_ARTERIAL

## 2022-04-30 MED ORDER — HEPARIN (PORCINE) IN NACL 1000-0.9 UT/500ML-% IV SOLN
INTRAVENOUS | Status: DC | PRN
  Administered 2022-04-30 (×2): 500 mL

## 2022-04-30 MED ORDER — HEPARIN (PORCINE) 25000 UT/250ML-% IV SOLN
1200.0000 [IU]/h | INTRAVENOUS | Status: DC
  Administered 2022-04-30: 1350 [IU]/h via INTRAVENOUS
  Administered 2022-05-01: 1200 [IU]/h via INTRAVENOUS
  Filled 2022-04-30 (×2): qty 250

## 2022-04-30 SURGICAL SUPPLY — 19 items
BAG SNAP BAND KOVER 36X36 (MISCELLANEOUS) ×1 IMPLANT
BAND ZEPHYR COMPRESS 30 LONG (HEMOSTASIS) ×1 IMPLANT
CATH 5FR JL3.5 JR4 ANG PIG MP (CATHETERS) ×1 IMPLANT
CATH 5FR JL4 DIAGNOSTIC (CATHETERS) ×1 IMPLANT
CATH BALLN WEDGE 5F 110CM (CATHETERS) ×1 IMPLANT
GLIDESHEATH SLEND SS 6F .021 (SHEATH) ×2 IMPLANT
GUIDEWIRE .025 260CM (WIRE) ×1 IMPLANT
GUIDEWIRE INQWIRE 1.5J.035X260 (WIRE) IMPLANT
INQWIRE 1.5J .035X260CM (WIRE) ×2
KIT HEART LEFT (KITS) ×2 IMPLANT
KIT MICROPUNCTURE NIT STIFF (SHEATH) ×1 IMPLANT
PACK CARDIAC CATHETERIZATION (CUSTOM PROCEDURE TRAY) ×2 IMPLANT
SHEATH GLIDE SLENDER 4/5FR (SHEATH) ×2 IMPLANT
SHEATH PINNACLE 5F 10CM (SHEATH) ×1 IMPLANT
SHEATH PROBE COVER 6X72 (BAG) ×1 IMPLANT
TRANSDUCER W/STOPCOCK (MISCELLANEOUS) ×2 IMPLANT
TUBING CIL FLEX 10 FLL-RA (TUBING) ×2 IMPLANT
WIRE EMERALD 3MM-J .035X150CM (WIRE) ×1 IMPLANT
WIRE HI TORQ VERSACORE-J 145CM (WIRE) ×1 IMPLANT

## 2022-04-30 NOTE — Telephone Encounter (Signed)
Pharmacy Patient Advocate Encounter  Insurance verification completed.    The patient is insured through [employer] Part D   The patient is currently admitted and ran test claims for the following: Entresto 24-26 mg.  Copays and coinsurance results were relayed to Inpatient clinical team.

## 2022-04-30 NOTE — Progress Notes (Signed)
Site area: rt groin arterial sheath Site Prior to Removal:  Level 0 Pressure Applied For: 20 minutes Manual:   yes Patient Status During Pull:  stable Post Pull Site:  Level 0 Post Pull Instructions Given:  yes Post Pull Pulses Present: rt dp dopplered Dressing Applied:  gauze and tegaderm Bedrest begins @ 0935 Comments:

## 2022-04-30 NOTE — Progress Notes (Signed)
ANTICOAGULATION CONSULT NOTE  Pharmacy Consult for heparin + warfarin Indication: atrial fibrillation  No Known Allergies  Patient Measurements: Height: 5\' 7"  (170.2 cm) Weight: 92.5 kg (204 lb) IBW/kg (Calculated) : 66.1 Heparin Dosing Weight: 92kg  Vital Signs: Temp: 97.3 F (36.3 C) (07/10 0547) Temp Source: Oral (07/10 1121) BP: 154/68 (07/10 1121) Pulse Rate: 54 (07/10 1121)  Labs: Recent Labs    04/30/22 0554  LABPROT 18.4*  INR 1.5*    Estimated Creatinine Clearance: 58.1 mL/min (by C-G formula based on SCr of 1.1 mg/dL).   Medical History: Past Medical History:  Diagnosis Date   Atrial fibrillation (HCC)    Hyperlipidemia    Hypertension    Type 2 diabetes mellitus (HCC)      Assessment: 88 yoM with hx AFib on warfarin PTA admitted for elective R/LHC. Pharmacy asked to start IV heparin 8h after sheath removal (~0930) and warfarin. INR 1.5 today - last warfarin dose 7/6.  Home warfarin dose 5mg  daily except 7.5mg  Sun/Thurs  Goal of Therapy:  INR 2-3 Heparin level 0.3-0.7 units/ml Monitor platelets by anticoagulation protocol: Yes   Plan:  -Heparin 1350 units/h no bolus at 1730 -Check heparin level in 8h -Warfarin 7.5mg  PO x1 tonight -Daily INR, heparin level, CBC  9/6, PharmD, BCPS, [facility] Clinical Pharmacist [phone] Please check AMION for all [facility] numbers 04/30/2022

## 2022-04-30 NOTE — Interval H&P Note (Signed)
History and Physical Interval Note:  04/30/2022 7:12 AM  Robert Baldwin  has presented today for surgery, with the diagnosis of severe pulmonary hypertension.  The various methods of treatment have been discussed with the patient and family. After consideration of risks, benefits and other options for treatment, the patient has consented to  Procedure(s): RIGHT/LEFT HEART CATH AND CORONARY ANGIOGRAPHY (N/A) as a surgical intervention.  The patient's history has been reviewed, patient examined, no change in status, stable for surgery.  I have reviewed the patient's chart and labs.  Questions were answered to the patient's satisfaction.    Cath Lab Visit (complete for each Cath Lab visit)  Clinical Evaluation Leading to the Procedure:   ACS: No.  Non-ACS:    Anginal/Heart Failure Classification: NYHA class III  Anti-ischemic medical therapy: Minimal Therapy (1 class of medications)  Non-Invasive Test Results: No non-invasive testing performed  Prior CABG: No previous CABG   Robert Baldwin

## 2022-04-30 NOTE — TOC Benefit Eligibility Note (Signed)
Patient Advocate Encounter  Insurance verification completed.    The patient is currently admitted and upon discharge could be taking Entresto 24-26 mg.  The current 30 day co-pay is, $47.00.   The patient is insured through [employer] Part D     Beverely Suen Kinnaird, CPhT Pharmacy Patient Advocate Specialist [facility] Patient Advocate Team Direct Number: [phone]  Fax: [phone]        

## 2022-04-30 NOTE — H&P (Addendum)
Advanced Heart Failure Team History and Physical Note   PCP:  Monico Blitz, MD  PCP-Cardiology: Rozann Lesches, MD     Reason for Admission: HFpEF with RV failure, severe pulmonary hypertension   HPI:   81 y.o. male with history of b/l carotid artery stenosis, DM II, HTN, HLD, permanent atrial fibrillation.  She follows with Dr. Domenic Polite in the cardiology clinic.   Saw [facility] for carotid artery stenosis 06/21. Was noting more dyspnea with exertion and lower extremity edema for about a month. He was subsequently started on po lasix 20 mg daily.  He was seen by Dr. Domenic Polite in the clinic 07/06 for evaluation of recent symptoms and pre-operative evaluation in preparation for left CEA followed by staged right CEA. Echo day of the visit EF 70-75%, hyperdynamic LV function, mild LVH, interventricular septum flattened in systole and diastole consistent with RV pressure and volume overload, RV moderately enlarged and moderately reduced, RVSP 120 mmHg, moderate LAE, severe RAE, moderate pericardial effusion, severe TR, dilated IVC.Patient was scheduled for outpatient R/LHC to better assess hemodynamics and further characterize pulmonary hypertension.   R/LHC today: no significant CAD, RA mean 25, PA 115/30 (58), LVEDP 22 mmHg, PCWP not obtained, Fick CO/CI 6.4/3.1, PAPi 3.4. PVR 5.6 WU.   He is being admitted for optimization and management of HFpEF with RV failure and pulmonary hypertension.  Patient reports he never had diagnosis of CHF or required loop diuretic until recently. Denies history of tobacco use. No known pulmonary disease or connective tissue disorder. He snores at night but has not had a sleep study.  Review of Systems: [y] = yes, [ ]  = no   General: Weight gain [ ] ; Weight loss [ ] ; Anorexia [ ] ; Fatigue [ ] ; Fever [ ] ; Chills [ ] ; Weakness [ ]   Cardiac: Chest pain/pressure [ ] ; Resting SOB [Y]; Exertional SOB [Y]; Orthopnea [Y]; Pedal Edema [Y]; Palpitations [ ] ; Syncope [ ] ;  Presyncope [ ] ; Paroxysmal nocturnal dyspnea[ ]   Pulmonary: Cough [ ] ; Wheezing[ ] ; Hemoptysis[ ] ; Sputum [ ] ; Snoring [ ]   GI: Vomiting[ ] ; Dysphagia[ ] ; Melena[ ] ; Hematochezia [ ] ; Heartburn[ ] ; Abdominal pain [ ] ; Constipation [ ] ; Diarrhea [ ] ; BRBPR [ ]   GU: Hematuria[ ] ; Dysuria [ ] ; Nocturia[ ]   Vascular: Pain in legs with walking [ ] ; Pain in feet with lying flat [ ] ; Non-healing sores [ ] ; Stroke [ ] ; TIA [ ] ; Slurred speech [ ] ;  Neuro: Headaches[ ] ; Vertigo[ ] ; Seizures[ ] ; Paresthesias[ ] ;Blurred vision [ ] ; Diplopia [ ] ; Vision changes [ ]   Ortho/Skin: Arthritis [ ] ; Joint pain [ ] ; Muscle pain [ ] ; Joint swelling [ ] ; Back Pain [ ] ; Rash [ ]   Psych: Depression[ ] ; Anxiety[ ]   Heme: Bleeding problems [ ] ; Clotting disorders [ ] ; Anemia [ ]   Endocrine: Diabetes [ Y]; Thyroid dysfunction[ ]    Home Medications Prior to Admission medications   Medication Sig Start Date End Date Taking? Authorizing Provider  amLODipine (NORVASC) 5 MG tablet Take 5 mg by mouth in the morning. 04/02/22  Yes [provider]  aspirin EC 81 MG tablet Take 81 mg by mouth at bedtime.   Yes [provider]  atorvastatin (LIPITOR) 40 MG tablet Take 40 mg by mouth at bedtime.   Yes [provider]  chlorthalidone (HYGROTON) 25 MG tablet Take 25 mg by mouth in the morning.   Yes [provider]  cholecalciferol (VITAMIN D) 25 MCG (1000 UNIT) tablet Take  1,000 Units by mouth in the morning and at bedtime.   Yes [provider]  fish oil-omega-3 fatty acids 1000 MG capsule Take 1 g by mouth every evening.   Yes [provider]  furosemide (LASIX) 20 MG tablet TAKE 1 TABLET BY MOUTH EVERY DAY 04/16/22  Yes Satira Sark, MD  Glucosamine-Chondroitin (GLUCOSAMINE CHONDR COMPLEX PO) Take 2 capsules by mouth in the morning.   Yes [provider]  JANUVIA 100 MG tablet Take 100 mg by mouth in the morning. 04/02/22  Yes [provider]  losartan  (COZAAR) 100 MG tablet Take 100 mg by mouth in the morning. 04/02/22  Yes [provider]  metFORMIN (GLUCOPHAGE) 1000 MG tablet Take 1,000-1,500 mg by mouth See admin instructions. Take 1.5 tablets (1500 mg) by mouth in the morning & take 1 tablet (1000 mg) by mouth  in the evening 02/01/12  Yes [provider]  Multiple Vitamins-Minerals (MULTIVITAMIN WITH MINERALS) tablet Take 1 tablet by mouth in the morning.  Centrum Silver   Yes [provider]  niacin 500 MG tablet Take 500 mg by mouth in the morning.   Yes [provider]  pioglitazone (ACTOS) 45 MG tablet Take 45 mg by mouth daily.   Yes [provider]  warfarin (COUMADIN) 5 MG tablet Take 5-7.5 mg by mouth See admin instructions. Managed by Dr. Trena Platt office Take 1.5 tablets (7.5 mg) by mouth in the evenings on Sundays & Thursdays. Take 1 tablet (5 mg) by mouth in the evening on Mondays, Tuesdays, Wednesdays, Fridays & Saturdays.   Yes [provider]    Past Medical History: Past Medical History:  Diagnosis Date   Atrial fibrillation ([place])    Hyperlipidemia    Hypertension    Type 2 diabetes mellitus ([place])     Past Surgical History: Past Surgical History:  Procedure Laterality Date   Cataract surgery     HERNIA REPAIR     YAG LASER APPLICATION Right [id number]   Procedure: YAG LASER APPLICATION;  Surgeon: Williams Che, MD;  Location: [facility];  Service: Ophthalmology;  Laterality: Right;    Family History:  Family History  Problem Relation Age of Onset   Stroke Other    Heart disease Other     Social History: Social History   Socioeconomic History   Marital status: Married    Spouse name: Not on file   Number of children: Not on file   Years of education: Not on file   Highest education level: Not on file  Occupational History   Occupation: Full time    Comment: Contractor  Tobacco Use   Smoking status: Never   Smokeless tobacco: Never  Substance and  Sexual Activity   Alcohol use: No    Alcohol/week: 0.0 standard drinks of alcohol   Drug use: No   Sexual activity: Not on file  Other Topics Concern   Not on file  Social History Narrative   Not on file   Social Determinants of Health   Financial Resource Strain: Not on file  Food Insecurity: Not on file  Transportation Needs: Not on file  Physical Activity: Not on file  Stress: Not on file  Social Connections: Not on file    Allergies:  No Known Allergies  Objective:    Vital Signs:   Temp:  [97.3 F (36.3 C)] 97.3 F (36.3 C) (07/10 0547) Pulse Rate:  [49-59] 50 (07/10 1005) Resp:  [13-24] 24 (07/10 1005) BP: (130-184)/(46-63)  162/50 (07/10 1005) SpO2:  [92 %-98 %] 92 % (07/10 1005) Weight:  [92.5 kg] 92.5 kg (07/10 0547)   Filed Weights   04/30/22 0547  Weight: 92.5 kg     Physical Exam     General:  Elderly male. Appears slightly uncomfortable lying supine post cath HEENT: Normal Neck: Supple. JVP to ear. Carotids 2+ bilat; no bruits.  Cor: PMI nondisplaced. Irregular rate & rhythm, brady. No rubs, gallops, + TR murmur Lungs: diminished in bases Abdomen: obese, soft, nontender, distended.  Extremities: No cyanosis, clubbing, rash, 3+ b/l LE edema Neuro: Alert & oriented x 3, cranial nerves grossly intact. moves all 4 extremities w/o difficulty. Affect pleasant.   Telemetry   Afib with slow ventricular response, rate 40s  EKG   Afib 46 bpm, RBBB  Labs     Basic Metabolic Panel: No results for input(s): "NA", "K", "CL", "CO2", "GLUCOSE", "BUN", "CREATININE", "CALCIUM", "MG", "PHOS" in the last 168 hours.  Liver Function Tests: No results for input(s): "AST", "ALT", "ALKPHOS", "BILITOT", "PROT", "ALBUMIN" in the last 168 hours. No results for input(s): "LIPASE", "AMYLASE" in the last 168 hours. No results for input(s): "AMMONIA" in the last 168 hours.  CBC: No results for input(s): "WBC", "NEUTROABS", "HGB", "HCT", "MCV", "PLT" in the last  168 hours.  Cardiac Enzymes: No results for input(s): "CKTOTAL", "CKMB", "CKMBINDEX", "TROPONINI" in the last 168 hours.  BNP: BNP (last 3 results) No results for input(s): "BNP" in the last 8760 hours.  ProBNP (last 3 results) No results for input(s): "PROBNP" in the last 8760 hours.   CBG: Recent Labs  Lab 04/30/22 0548 04/30/22 0914  GLUCAP 114* 126*    Coagulation Studies: Recent Labs    04/30/22 0554  LABPROT 18.4*  INR 1.5*    Imaging: CARDIAC CATHETERIZATION  Result Date: 04/30/2022 Conclusions: No angiographically significant coronary artery disease. Severely elevated pulmonary artery and right heart filling pressures (mean PA 58 mmHg, mean RA 25 mmHg). Mildly elevated left heart filling pressure (LVEDP 22 mmHg). Normal Fick cardiac output/index (CO 6.4 L/min, CI 3.1 L/min/m). Recommendations: Admit to telemetry for consultation with advanced heart failure team and optimization of severe pulmonary hypertension and right heart failure. Start IV heparin 8 hours after hemostasis achieved at right femoral arteriotomy site.    Patient Profile  81 y.o. male with history of permanent atrial fibrillation, b/l carotid artery stenosis, HTN, HLD, DM II.   Admitted post right heart cath for decompensated HFpEF with RV failure and pulmonary hypertension.  Assessment/Plan   HFpEF with RV failure -Echo in [facility] 02/23: EF 55%, RV normal in size, moderate TR, at least moderate pulmonary hypertension suspected (RVSP not estimated), mild MR -Echo 07/23: EF 70-75%, hyperdynamic LV function, mild LVH, interventricular septum flattened in systole and diastole consistent with RV pressure and volume overload, RV moderately enlarged and moderately reduced, RVSP 120 mmHg, moderate LAE, severe RAE, moderate pericardial effusion, severe TR, dilated IVC -R/LHC today: no significant CAD, RA mean 25, PA 115/30 (58), LVEDP 22 mmHg, PCWP not obtained, Fick CO/CI 6.4/3.1, PAPi 3.4 -Recent onset  dyspnea with exertion, abdominal bloating and lower extremity edema about a month ago. NYHA III.  -Start IV lasix 80 BID. If does not respond, will need lasix gtt -Stop amlodipine. Add spiro 12.5 mg daily. -Continue losartan 100 mg daily. ? Switching to Entresto. -Actos on home med list. Would avoid the medication in the setting of HF. -CMET, BNP today  Severe pulmonary hypertension -RHC as above -Etiology uncertain -Will  need further workup once diuresed including HRCT, ? V/Q scan but has been anticoagulated for afib. Denies history of smoking. No known pulmonary disease or connective tissue disorder. Hx snoring but has not been tested for OSA. Severe TR noted on recent echo.  Severe TR -Noted on recent echo  Permanent atrial fibrillation -Slow ventricular response, rate 40s on tele at time of evaluation -Not on any AV nodal blockers.  -Warfarin dosing per Pharmacy. INR 1.5 today.  Bilateral carotid artery stenosis -Follows with [facility]. Considering b/l CEA once cardiac issues optimized. -On statin + daily aspirin  DM II -On oral agents.  -Would avoid restarting actos at discharge. -SSI while admitted  HTN -BP elevated -Meds as above  ? Liver cirrhosis -Recent US ordered by PCP. Nodular appearance of liver noted. -Check CMET today   FINCH, LINDSAY N, PA-C 04/30/2022, 10:14 AM  Advanced Heart Failure Team Pager [phone] (M-F; 7a - 5p)  Please contact [facility] for night-coverage after hours (4p -7a ) and weekends on [email or web address]   Patient seen with PA, agree with the above note.    Patient has history of permanent atrial fibrillation but was noted to have RV dysfunction, severe TR, and pulmonary hypertension by echo.  He has bilateral carotid stenoses and needs endarterectomies.  He was scheduled for RHC/LHC today by Dr. Diona Browner for evaluation pre-endarterectomy.  This showed no significant CAD and markedly elevated RA pressure with mild LVEDP elevated.  Patient had  severe PAH.  He was admitted for diuresis/RV failure management.   General: NAD Neck: JVP 16 cm with prominent CV waves, no thyromegaly or thyroid nodule.  Lungs: Clear to auscultation bilaterally with normal respiratory effort. CV: Nondisplaced PMI.  Heart regular S1/S2, no S3/S4, 2/6 HSM LLSB.  1+ ankle edema.  No carotid bruit.  Normal pedal pulses.  Abdomen: Soft, nontender, no hepatosplenomegaly, no distention.  Skin: Intact without lesions or rashes.  Neurologic: Alert and oriented x 3.  Psych: Normal affect. Extremities: No clubbing or cyanosis.  HEENT: Normal.   1. 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 RHC/LHC today showed no significant CAD, elevated right >>>left heart filling pressures with severe pulmonary arterial hypertension and preserved cardiac output. Patient is volume overloaded on exam.  - Start Lasix 80 mg IV bid.  - Spironolactone 12.5 daily, stop amlodipine.  - Can continue home losartan.  - Eventually add SGLT2 inhibitor.  - Would not use Actos in future.  2. Pulmonary arterial hypertension: Severe PAH on RHC today, PVR 5.6 WU.  He has never smoked and has no known lung disease.  He has been on warfarin for atrial fibrillation long-term. No known rheumatologic illness.  Not a drinker though he has some signs of cirrhosis by prior liver imaging.  - V/Q scan to look for chronic PEs (though he has been on warfarin).  - High resolution CT chest after diuresis.  - Send serologic workup with anti-SCL 70, anti-centromere Ab, RF, ANA with reflex.  - Abdominal US to assess liver for cirrhosis.  3. Atrial fibrillation: Permanent.   - Continue  warfarin.  - Bradycardic, does not need nodal blocker.  4. DM2 5. Carotid stenosis: CTA neck in 6/23 showed high grade bilateral ICA stenoses.  -  Continue statin.  - He is on warfarin for AF.   Loralie Champagne 04/30/2022 7:05 PM

## 2022-05-01 ENCOUNTER — Inpatient Hospital Stay (HOSPITAL_COMMUNITY): Payer: Medicare PPO

## 2022-05-01 DIAGNOSIS — I272 Pulmonary hypertension, unspecified: Secondary | ICD-10-CM | POA: Diagnosis not present

## 2022-05-01 DIAGNOSIS — I50813 Acute on chronic right heart failure: Secondary | ICD-10-CM | POA: Diagnosis not present

## 2022-05-01 LAB — GLUCOSE, CAPILLARY
Glucose-Capillary: 106 mg/dL — ABNORMAL HIGH (ref 70–99)
Glucose-Capillary: 137 mg/dL — ABNORMAL HIGH (ref 70–99)
Glucose-Capillary: 180 mg/dL — ABNORMAL HIGH (ref 70–99)
Glucose-Capillary: 170 mg/dL — ABNORMAL HIGH (ref 70–99)

## 2022-05-01 LAB — BASIC METABOLIC PANEL
Anion gap: 5 (ref 5–15)
BUN: 24 mg/dL — ABNORMAL HIGH (ref 8–23)
CO2: 26 mmol/L (ref 22–32)
Calcium: 8.7 mg/dL — ABNORMAL LOW (ref 8.9–10.3)
Chloride: 108 mmol/L (ref 98–111)
Creatinine, Ser: 1.09 mg/dL (ref 0.61–1.24)
GFR, Estimated: >60 mL/min (ref >60)
Glucose, Bld: 109 mg/dL — ABNORMAL HIGH (ref 70–99)
Potassium: 4.1 mmol/L (ref 3.5–5.1)
Sodium: 139 mmol/L (ref 135–145)

## 2022-05-01 LAB — HEPARIN LEVEL (UNFRACTIONATED)
Heparin Unfractionated: 0.9 IU/mL — ABNORMAL HIGH (ref 0.30–0.70)
Heparin Unfractionated: >1.1 IU/mL — ABNORMAL HIGH (ref 0.30–0.70)
Heparin Unfractionated: 0.74 IU/mL — ABNORMAL HIGH (ref 0.30–0.70)

## 2022-05-01 LAB — CBC
HCT: 30.5 % — ABNORMAL LOW (ref 39.0–52.0)
Hemoglobin: 10 g/dL — ABNORMAL LOW (ref 13.0–17.0)
MCH: 30.3 pg (ref 26.0–34.0)
MCHC: 32.8 g/dL (ref 30.0–36.0)
MCV: 92.4 fL (ref 80.0–100.0)
Platelets: 160 10*3/uL (ref 150–400)
RBC: 3.3 MIL/uL — ABNORMAL LOW (ref 4.22–5.81)
RDW: 16.6 % — ABNORMAL HIGH (ref 11.5–15.5)
WBC: 7.2 10*3/uL (ref 4.0–10.5)
nRBC: 0 % (ref 0.0–0.2)

## 2022-05-01 LAB — PROTIME-INR
INR: 1.6 — ABNORMAL HIGH (ref 0.8–1.2)
Prothrombin Time: 18.8 seconds — ABNORMAL HIGH (ref 11.4–15.2)

## 2022-05-01 LAB — MAGNESIUM: Magnesium: 1.9 mg/dL (ref 1.7–2.4)

## 2022-05-01 MED ORDER — WARFARIN SODIUM 7.5 MG PO TABS
7.5000 mg | ORAL_TABLET | Freq: Once | ORAL | Status: AC
  Administered 2022-05-01: 7.5 mg via ORAL
  Filled 2022-05-01: qty 1

## 2022-05-01 MED ORDER — TECHNETIUM TO 99M ALBUMIN AGGREGATED
4.1000 | Freq: Once | INTRAVENOUS | Status: AC | PRN
  Administered 2022-05-01: 4.1 via INTRAVENOUS

## 2022-05-01 MED ORDER — HEPARIN (PORCINE) 25000 UT/250ML-% IV SOLN: 750.0000 [IU]/h | INTRAVENOUS | Status: DC

## 2022-05-01 MED ORDER — SPIRONOLACTONE 25 MG PO TABS
25.0000 mg | ORAL_TABLET | Freq: Every day | ORAL | Status: DC
  Administered 2022-05-01 – 2022-05-04 (×4): 25 mg via ORAL
  Filled 2022-05-01 (×4): qty 1

## 2022-05-01 MED ORDER — ORAL CARE MOUTH RINSE: 15.0000 mL | OROMUCOSAL | Status: DC | PRN

## 2022-05-01 MED ORDER — POTASSIUM CHLORIDE CRYS ER 20 MEQ PO TBCR
40.0000 meq | EXTENDED_RELEASE_TABLET | Freq: Once | ORAL | Status: AC
Start: 2022-05-01 — End: 2022-05-01
  Administered 2022-05-01: 40 meq via ORAL
  Filled 2022-05-01: qty 2

## 2022-05-01 MED ORDER — SACUBITRIL-VALSARTAN 49-51 MG PO TABS
1.0000 | ORAL_TABLET | Freq: Two times a day (BID) | ORAL | Status: DC
  Administered 2022-05-02: 1 via ORAL
  Filled 2022-05-01: qty 1

## 2022-05-01 MED ORDER — MAGNESIUM SULFATE 2 GM/50ML IV SOLN
2.0000 g | Freq: Once | INTRAVENOUS | Status: AC
  Administered 2022-05-01: 2 g via INTRAVENOUS
  Filled 2022-05-01: qty 50

## 2022-05-01 MED FILL — Heparin Sodium (Porcine) Inj 1000 Unit/ML: INTRAMUSCULAR | Qty: 10 | Status: AC

## 2022-05-01 NOTE — Plan of Care (Signed)
  Problem: Education: Goal: Understanding of CV disease, CV risk reduction, and recovery process will improve Outcome: Progressing Goal: Individualized Educational Video(s) Outcome: Progressing   Problem: Activity: Goal: Ability to return to baseline activity level will improve 05/01/2022 0111 by Kary Kos, RN Outcome: Progressing 05/01/2022 0110 by Kary Kos, RN Outcome: Progressing   Problem: Cardiovascular: Goal: Ability to achieve and maintain adequate cardiovascular perfusion will improve Outcome: Progressing Goal: Vascular access site(s) Level 0-1 will be maintained Outcome: Progressing   Problem: Health Behavior/Discharge Planning: Goal: Ability to safely manage health-related needs after discharge will improve Outcome: Progressing   Problem: Education: Goal: Ability to describe self-care measures that may prevent or decrease complications (Diabetes Survival Skills Education) will improve Outcome: Progressing Goal: Individualized Educational Video(s) Outcome: Progressing   Problem: Coping: Goal: Ability to adjust to condition or change in health will improve 05/01/2022 0111 by Kary Kos, RN Outcome: Progressing 05/01/2022 0110 by Kary Kos, RN Outcome: Progressing   Problem: Fluid Volume: Goal: Ability to maintain a balanced intake and output will improve Outcome: Progressing   Problem: Health Behavior/Discharge Planning: Goal: Ability to identify and utilize available resources and services will improve Outcome: Progressing Goal: Ability to manage health-related needs will improve Outcome: Progressing   Problem: Metabolic: Goal: Ability to maintain appropriate glucose levels will improve Outcome: Progressing   Problem: Nutritional: Goal: Maintenance of adequate nutrition will improve Outcome: Progressing Goal: Progress toward achieving an optimal weight will improve Outcome: Progressing   Problem: Skin Integrity: Goal: Risk  for impaired skin integrity will decrease Outcome: Progressing   Problem: Tissue Perfusion: Goal: Adequacy of tissue perfusion will improve Outcome: Progressing   Problem: Education: Goal: Knowledge of General Education information will improve Description: Including pain rating scale, medication(s)/side effects and non-pharmacologic comfort measures Outcome: Progressing   Problem: Health Behavior/Discharge Planning: Goal: Ability to manage health-related needs will improve Outcome: Progressing   Problem: Clinical Measurements: Goal: Ability to maintain clinical measurements within normal limits will improve Outcome: Progressing Goal: Will remain free from infection Outcome: Progressing Goal: Diagnostic test results will improve Outcome: Progressing Goal: Respiratory complications will improve 05/01/2022 0111 by Kary Kos, RN Outcome: Progressing 05/01/2022 0110 by Kary Kos, RN Outcome: Progressing Goal: Cardiovascular complication will be avoided 05/01/2022 0111 by Kary Kos, RN Outcome: Progressing 05/01/2022 0110 by Kary Kos, RN Outcome: Progressing   Problem: Activity: Goal: Risk for activity intolerance will decrease 05/01/2022 0111 by Kary Kos, RN Outcome: Progressing 05/01/2022 0110 by Kary Kos, RN Outcome: Progressing   Problem: Nutrition: Goal: Adequate nutrition will be maintained Outcome: Progressing   Problem: Coping: Goal: Level of anxiety will decrease 05/01/2022 0111 by Kary Kos, RN Outcome: Progressing 05/01/2022 0110 by Kary Kos, RN Outcome: Progressing   Problem: Elimination: Goal: Will not experience complications related to bowel motility Outcome: Progressing Goal: Will not experience complications related to urinary retention Outcome: Progressing   Problem: Pain Managment: Goal: General experience of comfort will improve 05/01/2022 0111 by Kary Kos, RN Outcome:  Progressing 05/01/2022 0110 by Kary Kos, RN Outcome: Progressing   Problem: Safety: Goal: Ability to remain free from injury will improve 05/01/2022 0111 by Kary Kos, RN Outcome: Progressing 05/01/2022 0110 by Kary Kos, RN Outcome: Progressing   Problem: Skin Integrity: Goal: Risk for impaired skin integrity will decrease Outcome: Progressing

## 2022-05-01 NOTE — Evaluation (Signed)
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 PMH includes b/l carotid artery stenosis, DM II, HTN, HLD, and permanent atrial fibrillation.  Clinical Impression  Received pt semi-reclined in bed with family present at bedside. Pt transferred semi-reclined<>sitting EOB with mod I and performed all transfers without AD and min guard. Pt ambulated 129ft without AD and min guard and requested to sit EOB at end of session. O2 sats dropped to 86% on RA after ambulation, but quickly improved to 95% with seated rest and pursed lip breathing. Pt's wife able to provide 24/7 supervision upon D/C and pt's niece who is an Charity fundraiser is also available to assist. Acute PT to cont to follow.      Recommendations for follow up therapy are one component of a multi-disciplinary discharge planning process, led by the attending physician.  Recommendations may be updated based on patient status, additional functional criteria and insurance authorization.  Follow Up Recommendations Home health PT      Assistance Recommended at Discharge Intermittent Supervision/Assistance  Patient can return home with the following  A little help with walking and/or transfers;A little help with bathing/dressing/bathroom;Help with stairs or ramp for entrance;Assist for transportation    Equipment Recommendations None recommended by PT  Recommendations for Other Services       Functional Status Assessment Patient has had a recent decline in their functional status and demonstrates the ability to make significant improvements in function in a reasonable and predictable amount of time.     Precautions / Restrictions Precautions Precautions: Other (comment) Precaution Comments: monitor O2 Restrictions Weight Bearing  Restrictions: No      Mobility  Bed Mobility Overal bed mobility: Modified Independent             General bed mobility comments: pt transferred supine<>sit with HOB elevated and minor use of bedrails Patient Response: Cooperative  Transfers Overall transfer level: Needs assistance Equipment used: None Transfers: Sit to/from Stand Sit to Stand: Min guard           General transfer comment: stood from EOB without AD and min guard    Ambulation/Gait Ambulation/Gait assistance: Min guard Gait Distance (Feet): 136 Feet Assistive device: None Gait Pattern/deviations: Step-through pattern, Trunk flexed, Narrow base of support Gait velocity: decreased Gait velocity interpretation: 1.31 - 2.62 ft/sec, indicative of limited community ambulator   General Gait Details: O2 sats dropped to 86% on RA after ambulation but quickly increased to 95% with seated rest and cues for pursed lip breathing.  Stairs            Wheelchair Mobility    Modified Rankin (Stroke Patients Only)       Balance Overall balance assessment: Needs assistance Sitting-balance support: Feet supported Sitting balance-Leahy Scale: Good     Standing balance support: No upper extremity supported Standing balance-Leahy Scale: Fair Standing balance comment: able to maintain static standing balance with supervision but required min guard for dynamic standing balance                             Pertinent Vitals/Pain Pain Assessment Pain Assessment: No/denies pain    Home Living Family/patient expects to be discharged to:: Private residence Living Arrangements: Spouse/significant other Available Help at Discharge: Family;Available 24 hours/day Type of  Home: House Home Access: Stairs to enter Entrance Stairs-Rails: Left Entrance Stairs-Number of Steps: 6. 1 step to get in laundry room   Home Layout: One level Home Equipment: Agricultural consultant (2 wheels);Cane - single  point;BSC/3in1;Shower seat Additional Comments: Pt reports being independent without AD    Prior Function Prior Level of Function : Independent/Modified Independent                     Hand Dominance   Dominant Hand: Right    Extremity/Trunk Assessment   Upper Extremity Assessment Upper Extremity Assessment: Defer to OT evaluation    Lower Extremity Assessment Lower Extremity Assessment: Generalized weakness    Cervical / Trunk Assessment Cervical / Trunk Assessment: Kyphotic (very mild)  Communication   Communication: No difficulties  Cognition Arousal/Alertness: Awake/alert Behavior During Therapy: WFL for tasks assessed/performed Overall Cognitive Status: Within Functional Limits for tasks assessed                                 General Comments: pleasant and cooperative; wife and niece present at bedside        General Comments General comments (skin integrity, edema, etc.): pt required cues for pursed lip breathing techniques. Niece (who is Charity fundraiser) plans on purchasing/teaching pt how to use pulse ox    Exercises     Assessment/Plan    PT Assessment Patient needs continued PT services  PT Problem List Decreased strength;Decreased activity tolerance;Decreased balance;Decreased mobility;Decreased coordination       PT Treatment Interventions DME instruction;Gait training;Stair training;Functional mobility training;Therapeutic activities;Therapeutic exercise;Balance training;Neuromuscular re-education;Patient/family education    PT Goals (Current goals can be found in the Care Plan section)  Acute Rehab PT Goals Patient Stated Goal: to return home PT Goal Formulation: With patient/family Time For Goal Achievement: 05/08/22 Potential to Achieve Goals: Good    Frequency Min 3X/week     Co-evaluation               AM-PAC PT "6 Clicks" Mobility  Outcome Measure Help needed turning from your back to your side while in a flat bed  without using bedrails?: None Help needed moving from lying on your back to sitting on the side of a flat bed without using bedrails?: A Little Help needed moving to and from a bed to a chair (including a wheelchair)?: A Little Help needed standing up from a chair using your arms (e.g., wheelchair or bedside chair)?: A Little Help needed to walk in hospital room?: A Little Help needed climbing 3-5 steps with a railing? : A Little 6 Click Score: 19    End of Session Equipment Utilized During Treatment: Gait belt Activity Tolerance: Patient tolerated treatment well Patient left: in bed;with call bell/phone within reach;with family/visitor present;with nursing/sitter in room Nurse Communication: Mobility status PT Visit Diagnosis: Unsteadiness on feet (R26.81);Other abnormalities of gait and mobility (R26.89);Muscle weakness (generalized) (M62.81)    Time: 9211-9417 PT Time Calculation (min) (ACUTE ONLY): 16 min   Charges:   PT Evaluation $PT Eval Low Complexity: 1 Low          Raechel Chute PT, DPT  Alfonso Patten 05/01/2022, 11:58 AM

## 2022-05-01 NOTE — Progress Notes (Addendum)
ANTICOAGULATION CONSULT NOTE  Pharmacy Consult for heparin + warfarin Indication: atrial fibrillation  No Known Allergies  Patient Measurements: Height: 5\' 7"  (170.2 cm) Weight: 91 kg (200 lb 9.9 oz) IBW/kg (Calculated) : 66.1 Heparin Dosing Weight: 92kg  Vital Signs: Temp: 98.8 F (37.1 C) (07/11 0724) Temp Source: Oral (07/11 0724) BP: 164/60 (07/11 0724) Pulse Rate: 23 (07/11 0724)  Labs: Recent Labs    04/30/22 0554 04/30/22 0802 04/30/22 0808 04/30/22 1130 05/01/22 0354  HGB  --    < > 11.2* 11.0* 10.0*  HCT  --    < > 33.0* 34.1* 30.5*  PLT  --   --   --  152 160  LABPROT 18.4*  --   --   --  18.8*  INR 1.5*  --   --   --  1.6*  HEPARINUNFRC  --   --   --   --  0.90*  CREATININE  --   --   --  1.13 1.09   < > = values in this interval not displayed.     Estimated Creatinine Clearance: 58.2 mL/min (by C-G formula based on SCr of 1.09 mg/dL).   Medical History: Past Medical History:  Diagnosis Date   Atrial fibrillation (HCC)    Hyperlipidemia    Hypertension    Type 2 diabetes mellitus (HCC)      Assessment: 56 yoM with hx AFib on warfarin PTA admitted for elective R/LHC. Pharmacy asked to start IV heparin after sheath removal and resume warfarin (last day PTA 7/6, holding pre-cath).  INR subtherapeutic at 1.6, CBC stable. Heparin reduced this am.  Home warfarin dose 5mg  daily except 7.5mg  Sun/Thurs  Goal of Therapy:  INR 2-3 Heparin level 0.3-0.7 units/ml Monitor platelets by anticoagulation protocol: Yes   Plan:  -Heparin 1200 units/h - repeat heparin level this afternoon -Warfarin 7.5mg  PO x1 again tonight -Daily INR, heparin level, CBC  ADDENDUM 1330: repeat heparin level is >1.1, confirmed it was drawn on opposite extremity from heparin infusion. Will hold heparin x1 hour and reduce infusion to 900 units/h.  9/6, PharmD, BCPS, [facility] Clinical Pharmacist [phone] Please check AMION for all [facility] Pharmacy numbers 05/01/2022

## 2022-05-01 NOTE — Progress Notes (Signed)
ANTICOAGULATION CONSULT NOTE  Pharmacy Consult for heparin + warfarin Indication: atrial fibrillation  No Known Allergies  Patient Measurements: Height: 5\' 7"  (170.2 cm) Weight: 92.6 kg (204 lb 2.3 oz) IBW/kg (Calculated) : 66.1 Heparin Dosing Weight: 92kg  Vital Signs: Temp: 97.8 F (36.6 C) (07/11 0400) Temp Source: Oral (07/11 0400) BP: 148/57 (07/11 0400) Pulse Rate: 44 (07/11 0015)  Labs: Recent Labs    04/30/22 0554 04/30/22 0802 04/30/22 0808 04/30/22 1130 05/01/22 0354  HGB  --    < > 11.2* 11.0* 10.0*  HCT  --    < > 33.0* 34.1* 30.5*  PLT  --   --   --  152 160  LABPROT 18.4*  --   --   --  18.8*  INR 1.5*  --   --   --  1.6*  HEPARINUNFRC  --   --   --   --  0.90*  CREATININE  --   --   --  1.13 1.09   < > = values in this interval not displayed.     Estimated Creatinine Clearance: 58.6 mL/min (by C-G formula based on SCr of 1.09 mg/dL).   Medical History: Past Medical History:  Diagnosis Date   Atrial fibrillation (HCC)    Hyperlipidemia    Hypertension    Type 2 diabetes mellitus (HCC)      Assessment: 89 yoM with hx AFib on warfarin PTA admitted for elective R/LHC. Pharmacy asked to start IV heparin 8h after sheath removal (~0930) and warfarin. INR 1.6 today - last warfarin dose 7/6.  Initial heparin level above goal: 0.90 on 1350 units/hr, no infusion issues or overt bleeding reported by RN  Home warfarin dose 5mg  daily except 7.5mg  Sun/Thurs  Goal of Therapy:  INR 2-3 Heparin level 0.3-0.7 units/ml Monitor platelets by anticoagulation protocol: Yes   Plan:  -Decrease heparin to 1200 units/hr -Check heparin level in 8h -Daily INR, heparin level, CBC  9/6, PharmD Clinical Pharmacist 05/01/2022 5:01 AM Please check AMION for all [facility] Pharmacy numbers

## 2022-05-01 NOTE — Evaluation (Signed)
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 PMH includes b/l carotid artery stenosis, DM II, HTN, HLD, and permanent atrial fibrillation.   Clinical Impression   Pt at this set was able to complete transfer and mobility with supervision to cue about pacing self as on RA as went to 79% but with cues they were able to bring back up to 92-94% in standing. Pt requires set up for Ue dressing and supervision to min guard for LB ADLS due to changes in positions. Pt and family was educated further about pacing due to o2 readings at this time. Pt currently with functional limitations due to the deficits listed below (see OT Problem List).  Pt will benefit from skilled OT to increase their safety and independence with ADL and functional mobility for ADL to facilitate discharge to venue listed below.        Recommendations for follow up therapy are one component of a multi-disciplinary discharge planning process, led by the attending physician.  Recommendations may be updated based on patient status, additional functional criteria and insurance authorization.   Follow Up Recommendations  No OT follow up    Assistance Recommended at Discharge Intermittent Supervision/Assistance  Patient can return home with the following Assistance with cooking/housework;Assist for transportation    Functional Status Assessment  Patient has had a recent decline in their functional status and demonstrates the ability to make significant improvements in function in a reasonable and predictable amount of time.  Equipment Recommendations  None recommended by OT    Recommendations for Other Services       Precautions / Restrictions Precautions Precautions: Other (comment) Precaution Comments:  monitor O2 Restrictions Weight Bearing Restrictions: No      Mobility Bed Mobility Overal bed mobility: Modified Independent             General bed mobility comments: HOB elevated    Transfers Overall transfer level: Needs assistance   Transfers: Sit to/from Stand Sit to Stand: Supervision           General transfer comment: cues for pacing self      Balance Overall balance assessment: Needs assistance Sitting-balance support: Feet supported Sitting balance-Leahy Scale: Good     Standing balance support: No upper extremity supported Standing balance-Leahy Scale: Good                             ADL either performed or assessed with clinical judgement   ADL Overall ADL's : Needs assistance/impaired Eating/Feeding: Independent;Sitting   Grooming: Wash/dry hands;Wash/dry face;Standing;Supervision/safety   Upper Body Bathing: Set up;Sitting   Lower Body Bathing: Min guard;Sit to/from stand   Upper Body Dressing : Set up;Sitting   Lower Body Dressing: Set up;Sit to/from stand   Toilet Transfer: Supervision/safety   Toileting- Architect and Hygiene: Supervision/safety;Sit to/from stand       Functional mobility during ADLs: Supervision/safety       Vision Baseline Vision/History: 1 Wears glasses       Perception     Praxis      Pertinent Vitals/Pain Pain Assessment Pain Assessment: No/denies pain     Hand Dominance Right   Extremity/Trunk Assessment Upper Extremity Assessment Upper Extremity Assessment: Defer to OT evaluation   Lower Extremity Assessment Lower  Extremity Assessment: Generalized weakness   Cervical / Trunk Assessment Cervical / Trunk Assessment: Kyphotic (very mild)   Communication Communication Communication: No difficulties   Cognition Arousal/Alertness: Awake/alert Behavior During Therapy: WFL for tasks assessed/performed Overall Cognitive Status: Within Functional Limits for tasks  assessed                                       General Comments  Pt required cues on pacing self as on RA pt's o2 went to 79% but when cues to stop and work on breathing techniques/positioning    Exercises     Shoulder Instructions      Home Living Family/patient expects to be discharged to:: Private residence Living Arrangements: Spouse/significant other Available Help at Discharge: Family;Available 24 hours/day Type of Home: House Home Access: Stairs to enter [employer] of Steps: 6. 1 step to get in laundry room Entrance Stairs-Rails: Left Home Layout: One level     Bathroom Shower/Tub: Chief Strategy Officer: Standard Bathroom Accessibility: Yes   Home Equipment: Agricultural consultant (2 wheels);Cane - single point;BSC/3in1;Shower seat   Additional Comments: Pt reports being independent without AD      Prior Functioning/Environment Prior Level of Function : Independent/Modified Independent                        OT Problem List: Decreased strength;Decreased range of motion;Decreased activity tolerance;Impaired balance (sitting and/or standing);Decreased safety awareness;Decreased knowledge of use of DME or AE;Cardiopulmonary status limiting activity      OT Treatment/Interventions: Self-care/ADL training;Energy conservation;DME and/or AE instruction;Therapeutic activities;Patient/family education;Balance training    OT Goals(Current goals can be found in the care plan section) Acute Rehab OT Goals Patient Stated Goal: to go home OT Goal Formulation: With patient Time For Goal Achievement: 05/15/22 Potential to Achieve Goals: Good  OT Frequency: Min 2X/week    Co-evaluation              AM-PAC OT "6 Clicks" Daily Activity     Outcome Measure Help from another person eating meals?: None Help from another person taking care of personal grooming?: None Help from another person toileting, which includes using toliet,  bedpan, or urinal?: A Little Help from another person bathing (including washing, rinsing, drying)?: A Little Help from another person to put on and taking off regular upper body clothing?: None Help from another person to put on and taking off regular lower body clothing?: A Little 6 Click Score: 21   End of Session Equipment Utilized During Treatment: Gait belt Nurse Communication: Mobility status  Activity Tolerance: Patient tolerated treatment well Patient left: in bed;with call bell/phone within reach;with family/visitor present  OT Visit Diagnosis: Muscle weakness (generalized) (M62.81)                Time: 5956-3875 OT Time Calculation (min): 26 min Charges:  OT General Charges $OT Visit: 1 Visit OT Evaluation $OT Eval Low Complexity: 1 Low OT Treatments $Self Care/Home Management : 8-22 mins  Alphia Moh OTR/L  Acute Rehab Services  [phone] office number [phone] pager number   Alphia Moh 05/01/2022, 11:51 AM

## 2022-05-01 NOTE — Progress Notes (Addendum)
Advanced Heart Failure Rounding Note  PCP-Cardiologist: Rozann Lesches, MD   Subjective:   Admitted post right heart cath for decompensated HFpEF with RV failure and pulmonary hypertension.   Started on IV lasix. Brisk diuresis noted. Weight down 3 pounds.   RHC/LHC RA (mean): 25 mmHg RV (S/EDP): 115/25 mmHg PA (S/D, mean): 115/30 (58) mmHg PCWP (mean): Not obtained Ao sat: 97% PA sat: 70% PVR 5.6 WU Fick CO: 6.4 L/min Fick CI: 3.1 L/min/m^2  Denies SOB. Feels a little better.    Objective:   Weight Range: 92.6 kg Body mass index is 31.97 kg/m.   Vital Signs:   Temp:  [97.6 F (36.4 C)-98.8 F (37.1 C)] 97.8 F (36.6 C) (07/11 0400) Pulse Rate:  [42-54] 44 (07/11 0015) Resp:  [12-24] 20 (07/11 0400) BP: (113-184)/(43-68) 148/57 (07/11 0400) SpO2:  [92 %-98 %] 94 % (07/11 0400) Weight:  [92.6 kg-92.8 kg] 92.6 kg (07/11 0015) Last BM Date : 04/29/22  Weight change: Filed Weights   04/30/22 0547 04/30/22 1121 05/01/22 0015  Weight: 92.5 kg 92.8 kg 92.6 kg    Intake/Output:   Intake/Output Summary (Last 24 hours) at 05/01/2022 0722 Last data filed at 05/01/2022 0402 Gross per 24 hour  Intake 310.65 ml  Output 2000 ml  Net -1689.35 ml      Physical Exam    General:  No resp difficulty HEENT: Normal Neck: Supple. JVP to ear. Carotids 2+ bilat; no bruits. No lymphadenopathy or thyromegaly appreciated. Cor: PMI nondisplaced. Regular rate & rhythm. No rubs, gallops or murmurs. Lungs: RLL decreased . On room air.  Abdomen: Soft, nontender, nondistended. No hepatosplenomegaly. No bruits or masses. Good bowel sounds. Extremities: No cyanosis, clubbing, rash, R and LLE 2-3+ edema Neuro: Alert & orientedx3, cranial nerves grossly intact. moves all 4 extremities w/o difficulty. Affect pleasant   Telemetry   SB 40-50s 7 beats NSVT.   EKG    N/A  Labs    CBC Recent Labs    04/30/22 1130 05/01/22 0354  WBC 7.9 7.2  NEUTROABS 5.8  --   HGB  11.0* 10.0*  HCT 34.1* 30.5*  MCV 95.0 92.4  PLT 152 0000000   Basic Metabolic Panel Recent Labs    04/30/22 1130 05/01/22 0354  NA 140 139  K 4.8 4.1  CL 105 108  CO2 25 26  GLUCOSE 109* 109*  BUN 25* 24*  CREATININE 1.13 1.09  CALCIUM 9.1 8.7*  MG 1.8 1.9   Liver Function Tests Recent Labs    04/30/22 1130  AST 35  ALT 17  ALKPHOS 76  BILITOT 1.5*  PROT 6.6  ALBUMIN 3.4*   No results for input(s): "LIPASE", "AMYLASE" in the last 72 hours. Cardiac Enzymes No results for input(s): "CKTOTAL", "CKMB", "CKMBINDEX", "TROPONINI" in the last 72 hours.  BNP: BNP (last 3 results) Recent Labs    04/30/22 1135  BNP 689.4*    ProBNP (last 3 results) No results for input(s): "PROBNP" in the last 8760 hours.   D-Dimer No results for input(s): "DDIMER" in the last 72 hours. Hemoglobin A1C Recent Labs    04/30/22 1135  HGBA1C 5.8*   Fasting Lipid Panel No results for input(s): "CHOL", "HDL", "LDLCALC", "TRIG", "CHOLHDL", "LDLDIRECT" in the last 72 hours. Thyroid Function Tests No results for input(s): "TSH", "T4TOTAL", "T3FREE", "THYROIDAB" in the last 72 hours.  Invalid input(s): "FREET3"  Other results:   Imaging    CARDIAC CATHETERIZATION  Result Date: 04/30/2022 Conclusions: No angiographically significant coronary  artery disease. Severely elevated pulmonary artery and right heart filling pressures (mean PA 58 mmHg, mean RA 25 mmHg). Mildly elevated left heart filling pressure (LVEDP 22 mmHg). Normal Fick cardiac output/index (CO 6.4 L/min, CI 3.1 L/min/m). Small, heavily calcified right radial artery precluding catheter advancement from this approach.  Recommend alternative access if future catheterizations are needed. Recommendations: Admit to telemetry for consultation with advanced heart failure team and optimization of severe pulmonary hypertension and right heart failure. Start IV heparin 8 hours after hemostasis achieved at right femoral arteriotomy  site.  Defer reinitiation of warfarin until it is clear that invasive procedures are not needed during this admission. Yvonne Kendall, MD [facility]    Medications:     Scheduled Medications:  aspirin EC  81 mg Oral QHS   atorvastatin  40 mg Oral QHS   cholecalciferol  1,000 Units Oral BID   furosemide  80 mg Intravenous BID   insulin aspart  0-15 Units Subcutaneous TID WC   insulin aspart  0-5 Units Subcutaneous QHS   linagliptin  5 mg Oral Daily   losartan  100 mg Oral q AM   sodium chloride flush  3 mL Intravenous Q12H   sodium chloride flush  3 mL Intravenous Q12H   spironolactone  12.5 mg Oral Daily   Warfarin - Pharmacist Dosing Inpatient   Does not apply q1600    Infusions:  sodium chloride     sodium chloride     heparin 1,200 Units/hr (05/01/22 0558)    PRN Medications: sodium chloride, sodium chloride, acetaminophen, ondansetron (ZOFRAN) IV, sodium chloride flush, sodium chloride flush    Patient Profile  81 y.o. male with history of permanent atrial fibrillation, b/l carotid artery stenosis, HTN, HLD, DM II.    Admitted post right heart cath for decompensated HFpEF with RV failure and pulmonary hypertension   Assessment/Plan   HFpEF with RV failure -Echo in [facility] 02/23: EF 55%, RV normal in size, moderate TR, at least moderate pulmonary hypertension suspected (RVSP not estimated), mild MR -Echo 07/23: EF 70-75%, hyperdynamic LV function, mild LVH, interventricular septum flattened in systole and diastole consistent with RV pressure and volume overload, RV moderately enlarged and moderately reduced, RVSP 120 mmHg, moderate LAE, severe RAE, moderate pericardial effusion, severe TR, dilated IVC -R/LHC -no significant CAD, RA mean 25, PA 115/30 (58), LVEDP 22 mmHg, PCWP not obtained, Fick CO/CI 6.4/3.1, PAPi 3.4 -Post cath started on IV lasix.  Brisk diuresis noted.  - Continue IV lasix 80 mg twice a day.  - Increase 25 mg daily.  -Stop losartan (already dose  this morning) will switch to entresto 49-51 mg twice a day tomorrow.   - Renal function stable.  -Actos on home med list. Would avoid the medication in the setting of HF. - Renal function stable.   Severe pulmonary hypertension -RHC as above -Etiology uncertain.  -Will need further workup once diuresed including HRCT  - VQ ordered but not completed yet.  - Rheumatology labs ordered - He has not had sleep study. Will need outpatient sleep study.   -Denies history of smoking. No known pulmonary disease or connective tissue disorder. Hx snoring but has not been tested for OSA. Severe TR noted on recent echo.   Severe TR -Noted on recent echo   Permanent atrial fibrillation -Remains bradycardic.  Not on any AV nodal blockers.  -Warfarin dosing per Pharmacy. INR 1.6 today.   Bilateral carotid artery stenosis -Follows with [facility]. Considering b/l CEA once cardiac issues  optimized. -On statin + daily aspirin   DM II -On oral agents.  -Would avoid restarting actos at discharge. -SSI while admitted   HTN -As above switching to entresto   ? Liver cirrhosis -Recent US ordered by PCP. Nodular appearance of liver noted.   OOB. Consult cardiac rehab.    Length of Stay: 1  Amy Clegg, NP  05/01/2022, 7:22 AM  Advanced Heart Failure Team Pager [phone] (M-F; 7a - 5p)  Please contact [facility] for night-coverage after hours (5p -7a ) and weekends on [email or web address]  Patient seen with NP, agree with the above note.    Breathing better today, diuresed overnight. Weight down 4 lbs.  Creatinine stable.    General: NAD Neck: JVP 14+, no thyromegaly or thyroid nodule.  Lungs: Clear to auscultation bilaterally with normal respiratory effort. CV: Nondisplaced PMI.  Heart irregular S1/S2, no S3/S4, no murmur.  1+ ankle edema.  Abdomen: Soft, nontender, no hepatosplenomegaly, no distention.  Skin: Intact without lesions or rashes.  Neurologic: Alert and oriented x 3.  Psych: Normal  affect. Extremities: No clubbing or cyanosis.  HEENT: Normal.   1. 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 RHC/LHC today showed no significant CAD, elevated right >>>left heart filling pressures with severe pulmonary arterial hypertension and preserved cardiac output. Patient is volume overloaded on exam still though diuresing well with Lasix and weight down 4 lbs.  - Continue Lasix 80 mg IV bid.  - Increase spironolactone to 25 mg daily.  - Agree with transition to losartan to [facility] for better BP control.  - Eventually add SGLT2 inhibitor.  - Would not use Actos in future.  2. Pulmonary arterial hypertension: Severe PAH on RHC today, PVR 5.6 WU.  He has never smoked and has no known lung disease.  He has been on warfarin for atrial fibrillation long-term. No known rheumatologic illness.  Not a drinker though he has some signs of cirrhosis by prior liver imaging.  - V/Q scan to look for chronic PEs (though he has been on warfarin).  - High resolution CT chest after diuresis, ?tomorrow.  - Sent serologic workup with anti-SCL 70, anti-centromere Ab, RF, ANA with reflex.  - Will need sleep study as outpatient.  3. Atrial fibrillation: Permanent.   - Continue warfarin.  - Bradycardic, does not need nodal blocker.  4. DM2 5. Carotid stenosis: CTA neck in 6/23 showed high grade bilateral ICA stenoses.  - Continue statin.  - He is on warfarin for AF.   Marca Ancona 05/01/2022 9:00 AM

## 2022-05-01 NOTE — Progress Notes (Signed)
ANTICOAGULATION CONSULT NOTE  Pharmacy Consult for heparin + warfarin Indication: atrial fibrillation  No Known Allergies  Patient Measurements: Height: 5\' 7"  (170.2 cm) Weight: 91 kg (200 lb 9.9 oz) IBW/kg (Calculated) : 66.1 Heparin Dosing Weight: 92kg  Vital Signs: Temp: 98.3 F (36.8 C) (07/11 2040) Temp Source: Oral (07/11 2040) BP: 132/52 (07/11 2040) Pulse Rate: 52 (07/11 2040)  Labs: Recent Labs    04/30/22 0554 04/30/22 0802 04/30/22 0808 04/30/22 1130 05/01/22 0354 05/01/22 1234 05/01/22 2207  HGB  --    < > 11.2* 11.0* 10.0*  --   --   HCT  --    < > 33.0* 34.1* 30.5*  --   --   PLT  --   --   --  152 160  --   --   LABPROT 18.4*  --   --   --  18.8*  --   --   INR 1.5*  --   --   --  1.6*  --   --   HEPARINUNFRC  --   --   --   --  0.90* >1.10* 0.74*  CREATININE  --   --   --  1.13 1.09  --   --    < > = values in this interval not displayed.     Estimated Creatinine Clearance: 58.2 mL/min (by C-G formula based on SCr of 1.09 mg/dL).   Medical History: Past Medical History:  Diagnosis Date   Atrial fibrillation (HCC)    Hyperlipidemia    Hypertension    Type 2 diabetes mellitus (HCC)      Assessment: 44 yoM with hx AFib on warfarin PTA admitted for elective R/LHC. Pharmacy asked to start IV heparin after sheath removal and resume warfarin (last day PTA 7/6, holding pre-cath).  Heparin level remains above goal following several rate decreases: 0.74, RN reports bleeding from IV sites and cath site. Provider aware too. Hgb down slightly, PLT stable  Home warfarin dose 5mg  daily except 7.5mg  Sun/Thurs  Goal of Therapy:  INR 2-3 Heparin level 0.3-0.7 units/ml Monitor platelets by anticoagulation protocol: Yes   Plan:  -Reduce heparin gtt to 750 units/hr  -Daily INR, heparin level, CBC  9/6, PharmD Clinical Pharmacist 05/01/2022 11:38 PM Please check AMION for all [facility] Pharm[facility] numbers

## 2022-05-02 ENCOUNTER — Encounter (HOSPITAL_COMMUNITY): Payer: Medicare PPO

## 2022-05-02 ENCOUNTER — Inpatient Hospital Stay (HOSPITAL_COMMUNITY): Payer: Medicare PPO

## 2022-05-02 ENCOUNTER — Telehealth (HOSPITAL_COMMUNITY): Payer: Self-pay | Admitting: Pharmacist

## 2022-05-02 ENCOUNTER — Other Ambulatory Visit (HOSPITAL_COMMUNITY): Payer: Self-pay

## 2022-05-02 DIAGNOSIS — I724 Aneurysm of artery of lower extremity: Secondary | ICD-10-CM | POA: Diagnosis not present

## 2022-05-02 DIAGNOSIS — I272 Pulmonary hypertension, unspecified: Secondary | ICD-10-CM | POA: Diagnosis not present

## 2022-05-02 LAB — ENA+DNA/DS+ANTICH+CENTRO+JO...
Anti JO-1: <0.2 AI (ref 0.0–0.9)
Centromere Ab Screen: <0.2 AI (ref 0.0–0.9)
Chromatin Ab SerPl-aCnc: <0.2 AI (ref 0.0–0.9)
ENA SM Ab Ser-aCnc: <0.2 AI (ref 0.0–0.9)
Ribonucleic Protein: 2.4 AI — ABNORMAL HIGH (ref 0.0–0.9)
SSA (Ro) (ENA) Antibody, IgG: <0.2 AI (ref 0.0–0.9)
SSB (La) (ENA) Antibody, IgG: <0.2 AI (ref 0.0–0.9)
Scleroderma (Scl-70) (ENA) Antibody, IgG: <0.2 AI (ref 0.0–0.9)
ds DNA Ab: 3 IU/mL (ref 0–9)

## 2022-05-02 LAB — BASIC METABOLIC PANEL
Anion gap: 11 (ref 5–15)
BUN: 28 mg/dL — ABNORMAL HIGH (ref 8–23)
CO2: 26 mmol/L (ref 22–32)
Calcium: 8.8 mg/dL — ABNORMAL LOW (ref 8.9–10.3)
Chloride: 103 mmol/L (ref 98–111)
Creatinine, Ser: 1.33 mg/dL — ABNORMAL HIGH (ref 0.61–1.24)
GFR, Estimated: 54 mL/min — ABNORMAL LOW (ref >60)
Glucose, Bld: 148 mg/dL — ABNORMAL HIGH (ref 70–99)
Potassium: 4.5 mmol/L (ref 3.5–5.1)
Sodium: 140 mmol/L (ref 135–145)

## 2022-05-02 LAB — CBC
HCT: 27.9 % — ABNORMAL LOW (ref 39.0–52.0)
HCT: 26.8 % — ABNORMAL LOW (ref 39.0–52.0)
HCT: 27 % — ABNORMAL LOW (ref 39.0–52.0)
Hemoglobin: 9.1 g/dL — ABNORMAL LOW (ref 13.0–17.0)
Hemoglobin: 8.7 g/dL — ABNORMAL LOW (ref 13.0–17.0)
Hemoglobin: 9 g/dL — ABNORMAL LOW (ref 13.0–17.0)
MCH: 30.4 pg (ref 26.0–34.0)
MCH: 30.4 pg (ref 26.0–34.0)
MCH: 30.5 pg (ref 26.0–34.0)
MCHC: 32.6 g/dL (ref 30.0–36.0)
MCHC: 32.5 g/dL (ref 30.0–36.0)
MCHC: 33.3 g/dL (ref 30.0–36.0)
MCV: 93.3 fL (ref 80.0–100.0)
MCV: 93.7 fL (ref 80.0–100.0)
MCV: 91.5 fL (ref 80.0–100.0)
Platelets: 112 10*3/uL — ABNORMAL LOW (ref 150–400)
Platelets: 149 10*3/uL — ABNORMAL LOW (ref 150–400)
Platelets: 152 10*3/uL (ref 150–400)
RBC: 2.99 MIL/uL — ABNORMAL LOW (ref 4.22–5.81)
RBC: 2.86 MIL/uL — ABNORMAL LOW (ref 4.22–5.81)
RBC: 2.95 MIL/uL — ABNORMAL LOW (ref 4.22–5.81)
RDW: 16.4 % — ABNORMAL HIGH (ref 11.5–15.5)
RDW: 16.3 % — ABNORMAL HIGH (ref 11.5–15.5)
RDW: 16.4 % — ABNORMAL HIGH (ref 11.5–15.5)
WBC: 10.1 10*3/uL (ref 4.0–10.5)
WBC: 10 10*3/uL (ref 4.0–10.5)
WBC: 9.8 10*3/uL (ref 4.0–10.5)
nRBC: 0 % (ref 0.0–0.2)
nRBC: 0 % (ref 0.0–0.2)
nRBC: 0 % (ref 0.0–0.2)

## 2022-05-02 LAB — GLUCOSE, CAPILLARY
Glucose-Capillary: 143 mg/dL — ABNORMAL HIGH (ref 70–99)
Glucose-Capillary: 179 mg/dL — ABNORMAL HIGH (ref 70–99)
Glucose-Capillary: 130 mg/dL — ABNORMAL HIGH (ref 70–99)
Glucose-Capillary: 136 mg/dL — ABNORMAL HIGH (ref 70–99)

## 2022-05-02 LAB — PROTIME-INR
INR: 1.6 — ABNORMAL HIGH (ref 0.8–1.2)
Prothrombin Time: 18.7 seconds — ABNORMAL HIGH (ref 11.4–15.2)

## 2022-05-02 LAB — CENTROMERE ANTIBODIES: Centromere Ab Screen: <0.2 AI (ref 0.0–0.9)

## 2022-05-02 LAB — RHEUMATOID FACTOR: Rheumatoid fact SerPl-aCnc: <10 IU/mL (ref <14.0)

## 2022-05-02 LAB — HEPARIN LEVEL (UNFRACTIONATED): Heparin Unfractionated: 0.49 IU/mL (ref 0.30–0.70)

## 2022-05-02 LAB — ANA W/REFLEX IF POSITIVE: Anti Nuclear Antibody (ANA): POSITIVE — AB

## 2022-05-02 LAB — LIPOPROTEIN A (LPA): Lipoprotein (a): 24.4 nmol/L (ref <75.0)

## 2022-05-02 LAB — ANTI-SCLERODERMA ANTIBODY: Scleroderma (Scl-70) (ENA) Antibody, IgG: <0.2 AI (ref 0.0–0.9)

## 2022-05-02 MED ORDER — SACUBITRIL-VALSARTAN 24-26 MG PO TABS
1.0000 | ORAL_TABLET | Freq: Two times a day (BID) | ORAL | Status: DC
  Administered 2022-05-02: 1 via ORAL
  Filled 2022-05-02: qty 1

## 2022-05-02 NOTE — Telephone Encounter (Signed)
Advanced Heart Failure Patient Advocate Encounter  Prior Authorization for [employer] has been approved.    PA# [id number] Effective dates: 10/22/21 through 10/21/22  Karle Plumber, PharmD, BCPS, BCCP, CPP Heart Failure Clinic Pharmacist [phone]

## 2022-05-02 NOTE — Progress Notes (Addendum)
Advanced Heart Failure Rounding Note  PCP-Cardiologist: Rozann Lesches, MD   Subjective:   Admitted post right heart cath for decompensated HFpEF with RV failure and severe pulmonary arterial hypertension.   V/Q scan negative for PE. Serologic workup  3.4L in [facility] yesterday. Wt down another 6 lb.   SCr 1.09>>1.33. Losartan>>Entresto yesterday. SBPs 160s>>130s.  K 4.5   Developed new bleeding and bruising at radial cath site and hematoma at rt femoral cath sight overnight.  Hgb 11.0>>10.0>>9.1>>8.7    + femoral bruit noted on exam. Vascular US ordered to r/o pseudoaneurysm. Denies flank/LBP.   Radial artery bleeding has stopped.   Remains on heparin gtt.   Feels his breathing has improved. Rt groin a bit sore but no other complaints.   RHC/LHC RA (mean): 25 mmHg RV (S/EDP): 115/25 mmHg PA (S/D, mean): 115/30 (58) mmHg PCWP (mean): Not obtained LVEDP 22 Ao sat: 97% PA sat: 70% PVR 5.6 WU (using LVEDP rather than PCWP to calculate) Fick CO: 6.4 L/min Fick CI: 3.1 L/min/m^2  Denies SOB. Feels a little better.    Objective:   Weight Range: 88.2 kg Body mass index is 30.45 kg/m.   Vital Signs:   Temp:  [98 F (36.7 C)-98.3 F (36.8 C)] 98 F (36.7 C) (07/12 0354) Pulse Rate:  [47-52] 51 (07/12 0354) Resp:  [17-18] 18 (07/12 0354) BP: (130-145)/(46-52) 130/46 (07/12 0354) SpO2:  [93 %-96 %] 94 % (07/12 0354) Weight:  [88.2 kg] 88.2 kg (07/12 0354) Last BM Date : 04/29/22  Weight change: Filed Weights   05/01/22 0015 05/01/22 0724 05/02/22 0354  Weight: 92.6 kg 91 kg 88.2 kg    Intake/Output:   Intake/Output Summary (Last 24 hours) at 05/02/2022 0810 Last data filed at 05/02/2022 0358 Gross per 24 hour  Intake 654.52 ml  Output 3425 ml  Net -2770.48 ml      Physical Exam    General:  Well appearing, elderly male. No respiratory difficulty HEENT: normal Neck: supple. JVD elevated to jaw . Carotids 2+ bilat; no bruits. No lymphadenopathy or  thyromegaly appreciated. Cor: PMI nondisplaced. Regular rhythm, slow rate. No rubs, gallops or murmurs. Lungs: clear Abdomen: obese, soft, nontender, nondistended. No hepatosplenomegaly. No bruits or masses. Good bowel sounds. Extremities: no cyanosis, clubbing, rash, edema + rt radial cath site w/ mild proximal ecchymosis, 1+ radial pulse, Rt groin w/ medium sized hematoma w/ mild ecchymosis and + bruit, slightly tender to touch  Neuro: alert & oriented x 3, cranial nerves grossly intact. moves all 4 extremities w/o difficulty. Affect pleasant.  Telemetry   SB 40-50s (asymptomatic)   EKG    N/A  Labs    CBC Recent Labs    04/30/22 1130 05/01/22 0354 05/01/22 2356  WBC 7.9 7.2 10.1  NEUTROABS 5.8  --   --   HGB 11.0* 10.0* 9.1*  HCT 34.1* 30.5* 27.9*  MCV 95.0 92.4 93.3  PLT 152 160 [id number]*   Basic Metabolic Panel Recent Labs    04/30/22 1130 05/01/22 0354 05/02/22 0037  NA 140 139 140  K 4.8 4.1 4.5  CL 105 108 103  CO2 25 26 26   GLUCOSE 109* 109* 148*  BUN 25* 24* 28*  CREATININE 1.13 1.09 1.33*  CALCIUM 9.1 8.7* 8.8*  MG 1.8 1.9  --    Liver Function Tests Recent Labs    04/30/22 1130  AST 35  ALT 17  ALKPHOS 76  BILITOT 1.5*  PROT 6.6  ALBUMIN 3.4*   No  results for input(s): "LIPASE", "AMYLASE" in the last 72 hours. Cardiac Enzymes No results for input(s): "CKTOTAL", "CKMB", "CKMBINDEX", "TROPONINI" in the last 72 hours.  BNP: BNP (last 3 results) Recent Labs    04/30/22 1135  BNP 689.4*    ProBNP (last 3 results) No results for input(s): "PROBNP" in the last 8760 hours.   D-Dimer No results for input(s): "DDIMER" in the last 72 hours. Hemoglobin A1C Recent Labs    04/30/22 1135  HGBA1C 5.8*   Fasting Lipid Panel No results for input(s): "CHOL", "HDL", "LDLCALC", "TRIG", "CHOLHDL", "LDLDIRECT" in the last 72 hours. Thyroid Function Tests No results for input(s): "TSH", "T4TOTAL", "T3FREE", "THYROIDAB" in the last 72  hours.  Invalid input(s): "FREET3"  Other results:   Imaging    NM Pulmonary Perfusion  Result Date: 05/01/2022 CLINICAL DATA:  Pulmonary hypertension, type II diabetes mellitus, hypertension, atrial fibrillation EXAM: NUCLEAR MEDICINE PERFUSION LUNG SCAN TECHNIQUE: Perfusion images were obtained in multiple projections after intravenous injection of radiopharmaceutical. Ventilation scans intentionally deferred if perfusion scan and chest x-ray adequate for interpretation during COVID 19 epidemic. RADIOPHARMACEUTICALS:  4.1 mCi Tc-69m MAA IV COMPARISON:  Chest radiograph 05/01/2022 FINDINGS: Cardiomegaly. Tiny subsegmental perfusion defect RIGHT middle lobe. Remaining perfusion normal. Chest radiograph significant for enlarged cardiac silhouette. IMPRESSION: Pulmonary embolism absent. Electronically Signed   By: Ulyses Southward M.D.   On: 05/01/2022 10:33   DG CHEST PORT 1 VIEW  Result Date: 05/01/2022 CLINICAL DATA:  Heart failure with preserved ejection fraction, diabetes mellitus, hypertension EXAM: PORTABLE CHEST 1 VIEW COMPARISON:  Portable exam 0812 hours without priors for comparison FINDINGS: Enlargement of cardiac silhouette. Mediastinal contours and pulmonary vascularity normal. Minimal bibasilar atelectasis. Lungs otherwise clear. No acute infiltrate, pleural effusion, or pneumothorax. Bones demineralized. IMPRESSION: Enlargement of cardiac silhouette with minimal bibasilar atelectasis. Electronically Signed   By: Ulyses Southward M.D.   On: 05/01/2022 08:22     Medications:     Scheduled Medications:  aspirin EC  81 mg Oral QHS   atorvastatin  40 mg Oral QHS   cholecalciferol  1,000 Units Oral BID   furosemide  80 mg Intravenous BID   insulin aspart  0-15 Units Subcutaneous TID WC   insulin aspart  0-5 Units Subcutaneous QHS   linagliptin  5 mg Oral Daily   sacubitril-valsartan  1 tablet Oral BID   sodium chloride flush  3 mL Intravenous Q12H   sodium chloride flush  3 mL  Intravenous Q12H   spironolactone  25 mg Oral Daily   Warfarin - Pharmacist Dosing Inpatient   Does not apply q1600    Infusions:  sodium chloride     sodium chloride     heparin 750 Units/hr (05/01/22 2346)    PRN Medications: sodium chloride, sodium chloride, acetaminophen, ondansetron (ZOFRAN) IV, mouth rinse, sodium chloride flush, sodium chloride flush    Patient Profile  81 y.o. male with history of permanent atrial fibrillation, b/l carotid artery stenosis, HTN, HLD, DM II.    Admitted post right heart cath for decompensated HFpEF with RV failure and pulmonary hypertension   Assessment/Plan   1. 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 RHC/LHC this admit showed no significant CAD, elevated right >>>left heart filling pressures  with severe pulmonary arterial hypertension and preserved cardiac output. Patient is volume overloaded on exam still though diuresing well with Lasix and weight down another 4 lb.  - Continue Lasix 80 mg IV bid.  - Continue spironolactone 25 mg daily.  - Continue Entresto 49-51 mg bid   - Eventually add SGLT2 inhibitor.  - Would not use Actos in future.  2. Pulmonary arterial hypertension: Severe PAH on RHC today, PVR 5.6 WU.  He has never smoked and has no known lung disease.  He has been on warfarin for atrial fibrillation long-term. No known rheumatologic illness.  Not a drinker though he has some signs of cirrhosis by prior liver imaging.  - V/Q scan negative for PE  - High resolution CT chest after diuresis, ?tomorrow.  - Sent serologic workup with anti-SCL 70 neg, anti-centromere Ab neg, RF neg, ANA with reflex pending.   - Will need sleep study as outpatient.  3. Atrial fibrillation: Permanent.   - Continue warfarin.  - Bradycardic, does not need nodal blocker.  4. DM2 5. Carotid stenosis: CTA neck in 6/23 showed high grade bilateral ICA stenoses.  - Continue statin.  - He is on warfarin for AF. 6. Rt Groin Hematoma: post cath. Hgb 11.0>>10.0>>9.1>>8.7. + femoral bruit noted on exam.  - Groin [place] ordered to r/o pseudoaneurysm  - If present, will need further manual compression +/- US guided thrombin injection  - stop heparin gtt for now. Follow CBC, transfuse for Hgb < 7.0   Length of Stay: 2  Lyda Jester, PA-C  05/02/2022, 8:10 AM  Advanced Heart Failure Team Pager [phone] (M-F; 7a - 5p)  Please contact [facility] for night-coverage after hours (5p -7a ) and weekends on [email or web address]  Patient seen with PA, agree with the above note.   Overnight, patient developed a hematoma at his right groin cath site.  Ultrasound today preliminarily showed no PSA or AVF.  Hgb down to 8.7.  He has been on heparin gtt for subtherapeutic INR in setting of chronic AF.    Patient diuresed well again yesterday, weight down.  Creatinine mildly higher at 1.33.   SBP around 130 on Entresto.   General: NAD Neck: JVP 12-14 cm, no thyromegaly or thyroid nodule.  Lungs: Clear to auscultation bilaterally with normal respiratory effort. CV: Nondisplaced PMI.  Heart mildly brady, irregular S1/S2, no S3/S4, no murmur.  No peripheral edema.  Abdomen: Soft, nontender, no hepatosplenomegaly, no distention.  Skin: Intact without lesions or rashes.  Neurologic: Alert and oriented x 3.  Psych: Normal affect. Extremities: Right groin hematoma.  HEENT: Normal.   Right groin hematoma in setting of heparin gtt post-femoral cath.   - Stop heparin/warfarin and ASA for now.  - Will engage cath lab staff to hold extended pressure at site versus fem stop.   - Repeat CBC in the afternoon, transfuse hgb < 8 in setting of acute  bleeding.   Still with volume overload on exam and diuresing well. Would like to try to get 1 more good day of diuresis (creatinine starting to trend up).  With up-trend in creatinine, will drop Entresto dose to 24/26 bid.   In terms of pulmonary hypertension workup, V/Q scan not suggestive of chronic PE.  Would get high resolution CT chest tomorrow to assess for parenchymal lung disease.  Rheumatologic serologies so far negative.  - HRCT tomorrow after full diuresis.  - If no evidence for significant lung parenchymal lung disease on HRCT, would initiate treatment for [facility]  after full diuresis.  Think it would be reasonable to start tadalafil 20 mg daily.   Severe PAH is going to be a significant risk for general anesthesia for CEA.  If it appears appropriate to start selective pulmonary vasodilators, I will try to titrate these up relatively rapidly then repeat RHC to see if we have been able to bring down his PA pressure before surgery.   Marca Ancona 05/02/2022 10:47 AM

## 2022-05-02 NOTE — Progress Notes (Signed)
Called to the floor from the cath lab to access Robert Baldwin's groin for a hematoma. Upon arrival to the room, the nurse stated that she had been holding pressure for 20 min. Hematoma appeared to be stage 2 hematoma 4.5cm x 4.5cm .I continued to hold pressure for an additional . Right groin is bruised but is soft to touch; hematoma was expressed. 4x4 pressure dressing applied with tegaderm. Post activity and precautions explained. Will continue to monitor.[name]

## 2022-05-02 NOTE — Progress Notes (Addendum)
New bleeding and bruising noticed at radial cath site. Physician notified and assessed patient; received verbal order for stat CBC and to demarcate the borders on both the radial and femoral sites with marker.  Further bleeding as evidence by oozing beyond marker border, hematoma growth beyond marker border, and drop in HgB by approximately 1 unit overnight. Cardiology coverage changed over; PA now covering notified. PA states she will assess.  Assessed by PA; new orders received.

## 2022-05-02 NOTE — Telephone Encounter (Signed)
Advanced Heart Failure Patient Advocate Encounter  Prior Authorization for tadalafil has been approved.    PA# [id number] Effective dates: 10/22/21 through 10/21/22  Karle Plumber, PharmD, BCPS, BCCP, CPP Heart Failure Clinic Pharmacist [phone]

## 2022-05-03 ENCOUNTER — Inpatient Hospital Stay (HOSPITAL_COMMUNITY): Payer: Medicare PPO

## 2022-05-03 ENCOUNTER — Telehealth (HOSPITAL_COMMUNITY): Payer: Self-pay | Admitting: Pharmacy Technician

## 2022-05-03 ENCOUNTER — Other Ambulatory Visit (HOSPITAL_COMMUNITY): Payer: Self-pay

## 2022-05-03 DIAGNOSIS — I272 Pulmonary hypertension, unspecified: Secondary | ICD-10-CM | POA: Diagnosis not present

## 2022-05-03 DIAGNOSIS — I50813 Acute on chronic right heart failure: Secondary | ICD-10-CM | POA: Diagnosis not present

## 2022-05-03 LAB — BASIC METABOLIC PANEL
Anion gap: 8 (ref 5–15)
BUN: 30 mg/dL — ABNORMAL HIGH (ref 8–23)
CO2: 31 mmol/L (ref 22–32)
Calcium: 8.9 mg/dL (ref 8.9–10.3)
Chloride: 100 mmol/L (ref 98–111)
Creatinine, Ser: 1.45 mg/dL — ABNORMAL HIGH (ref 0.61–1.24)
GFR, Estimated: 49 mL/min — ABNORMAL LOW (ref >60)
Glucose, Bld: 119 mg/dL — ABNORMAL HIGH (ref 70–99)
Potassium: 3.9 mmol/L (ref 3.5–5.1)
Sodium: 139 mmol/L (ref 135–145)

## 2022-05-03 LAB — IRON AND TIBC
Iron: 48 ug/dL (ref 45–182)
Saturation Ratios: 12 % — ABNORMAL LOW (ref 17.9–39.5)
TIBC: 393 ug/dL (ref 250–450)
UIBC: 345 ug/dL

## 2022-05-03 LAB — CBC
HCT: 24.3 % — ABNORMAL LOW (ref 39.0–52.0)
HCT: 27.7 % — ABNORMAL LOW (ref 39.0–52.0)
HCT: 25.6 % — ABNORMAL LOW (ref 39.0–52.0)
Hemoglobin: 8 g/dL — ABNORMAL LOW (ref 13.0–17.0)
Hemoglobin: 9.1 g/dL — ABNORMAL LOW (ref 13.0–17.0)
Hemoglobin: 8.3 g/dL — ABNORMAL LOW (ref 13.0–17.0)
MCH: 30 pg (ref 26.0–34.0)
MCH: 30.8 pg (ref 26.0–34.0)
MCH: 30.2 pg (ref 26.0–34.0)
MCHC: 32.9 g/dL (ref 30.0–36.0)
MCHC: 32.9 g/dL (ref 30.0–36.0)
MCHC: 32.4 g/dL (ref 30.0–36.0)
MCV: 91 fL (ref 80.0–100.0)
MCV: 93.9 fL (ref 80.0–100.0)
MCV: 93.1 fL (ref 80.0–100.0)
Platelets: 121 10*3/uL — ABNORMAL LOW (ref 150–400)
Platelets: 146 10*3/uL — ABNORMAL LOW (ref 150–400)
Platelets: 144 10*3/uL — ABNORMAL LOW (ref 150–400)
RBC: 2.67 MIL/uL — ABNORMAL LOW (ref 4.22–5.81)
RBC: 2.95 MIL/uL — ABNORMAL LOW (ref 4.22–5.81)
RBC: 2.75 MIL/uL — ABNORMAL LOW (ref 4.22–5.81)
RDW: 16.3 % — ABNORMAL HIGH (ref 11.5–15.5)
RDW: 16.3 % — ABNORMAL HIGH (ref 11.5–15.5)
RDW: 16.2 % — ABNORMAL HIGH (ref 11.5–15.5)
WBC: 8.6 10*3/uL (ref 4.0–10.5)
WBC: 8.9 10*3/uL (ref 4.0–10.5)
WBC: 9.7 10*3/uL (ref 4.0–10.5)
nRBC: 0 % (ref 0.0–0.2)
nRBC: 0 % (ref 0.0–0.2)
nRBC: 0 % (ref 0.0–0.2)

## 2022-05-03 LAB — GLUCOSE, CAPILLARY
Glucose-Capillary: 125 mg/dL — ABNORMAL HIGH (ref 70–99)
Glucose-Capillary: 172 mg/dL — ABNORMAL HIGH (ref 70–99)
Glucose-Capillary: 123 mg/dL — ABNORMAL HIGH (ref 70–99)
Glucose-Capillary: 154 mg/dL — ABNORMAL HIGH (ref 70–99)

## 2022-05-03 LAB — PROTIME-INR
INR: 1.8 — ABNORMAL HIGH (ref 0.8–1.2)
Prothrombin Time: 20.5 seconds — ABNORMAL HIGH (ref 11.4–15.2)

## 2022-05-03 LAB — FERRITIN: Ferritin: 63 ng/mL (ref 24–336)

## 2022-05-03 MED ORDER — WARFARIN - PHARMACIST DOSING INPATIENT
Freq: Every day | Status: DC
  Administered 2022-05-04: 1

## 2022-05-03 MED ORDER — POTASSIUM CHLORIDE CRYS ER 20 MEQ PO TBCR
40.0000 meq | EXTENDED_RELEASE_TABLET | Freq: Once | ORAL | Status: AC
Start: 2022-05-03 — End: 2022-05-03
  Administered 2022-05-03: 40 meq via ORAL
  Filled 2022-05-03: qty 2

## 2022-05-03 MED ORDER — DAPAGLIFLOZIN PROPANEDIOL 10 MG PO TABS
10.0000 mg | ORAL_TABLET | Freq: Every day | ORAL | Status: DC
  Administered 2022-05-03 – 2022-05-08 (×6): 10 mg via ORAL
  Filled 2022-05-03 (×6): qty 1

## 2022-05-03 MED ORDER — TORSEMIDE 20 MG PO TABS
40.0000 mg | ORAL_TABLET | Freq: Every day | ORAL | Status: DC
  Administered 2022-05-03 – 2022-05-05 (×3): 40 mg via ORAL
  Filled 2022-05-03 (×4): qty 2

## 2022-05-03 MED ORDER — WARFARIN SODIUM 7.5 MG PO TABS
7.5000 mg | ORAL_TABLET | Freq: Once | ORAL | Status: AC
Start: 2022-05-03 — End: 2022-05-03
  Administered 2022-05-03: 7.5 mg via ORAL
  Filled 2022-05-03: qty 1

## 2022-05-03 MED ORDER — TADALAFIL 20 MG PO TABS
20.0000 mg | ORAL_TABLET | Freq: Every day | ORAL | Status: DC
  Administered 2022-05-03 – 2022-05-04 (×2): 20 mg via ORAL
  Filled 2022-05-03 (×2): qty 1

## 2022-05-03 NOTE — Progress Notes (Addendum)
    Night nurse paged reporting patient with new groin hematoma, from today, estimated 1 cm in diameter, mild tender, no active bleeding, pulse +,  unknown onset, he felt this is new comparing to last night and day nurse not present to provide history. Chart reviewed, noted day rounder mentioned groin hematoma. Will repeat CBC now, agree with pressure dressing, advised RN call back if area enlarging. On call MD notified.    I did look at his groin.  He has no pain.  He has a hematoma that the nurse reports is not particularly larger than before although the ecchymosis is larger.  There is some pulsatility in the bruit and I am not sure if this was there before.  I would suggest an ultrasound in the morning.  It seems like he has hemostasis.

## 2022-05-03 NOTE — Progress Notes (Addendum)
Advanced Heart Failure Rounding Note  PCP-Cardiologist: [name], MD   Subjective:   Admitted post right heart cath for decompensated HFpEF with RV failure and severe pulmonary arterial hypertension.   V/Q scan negative for chronic PE. ANA+. All other serologic test -.   2.9L in UOP yesterday w/ IV lasix. Wt down another 6 lb.    SCr 1.09>>1.33>>1.45.  K 3.9   Developed groin hematoma post cath w/ drop in hgb 11.0>>10.0>>9.1>>8.7. Additional manual pressure held. [place] c/w hematoma. No groin pseudoaneurysm. Heparin and coumadin held.  ASA stopped.    Hgb 8.0 today  INR 1.8   Feels good today. Eating breakfast. Breathing much improved. Rt groin still sore but improved.   RHC/LHC RA (mean): 25 mmHg RV (S/EDP): 115/25 mmHg PA (S/D, mean): 115/30 (58) mmHg PCWP (mean): Not obtained LVEDP 22 Ao sat: 97% PA sat: 70% PVR 5.6 WU (using LVEDP rather than PCWP to calculate) Fick CO: 6.4 L/min Fick CI: 3.1 L/min/m^2    Objective:   Weight Range: 85.4 kg Body mass index is 29.49 kg/m.   Vital Signs:   Temp:  [97.7 F (36.5 C)-98.6 F (37 C)] 97.7 F (36.5 C) (07/13 0641) Pulse Rate:  [51-67] 51 (07/12 1942) Resp:  [18] 18 (07/13 0641) BP: (115-127)/(39-50) 127/50 (07/13 0641) SpO2:  [91 %-96 %] 96 % (07/13 0641) Weight:  [85.4 kg] 85.4 kg (07/13 0641) Last BM Date : 05/02/22  Weight change: Filed Weights   05/01/22 0724 05/02/22 0354 05/03/22 0641  Weight: 91 kg 88.2 kg 85.4 kg    Intake/Output:   Intake/Output Summary (Last 24 hours) at 05/03/2022 0727 Last data filed at 05/03/2022 0650 Gross per 24 hour  Intake 363 ml  Output 2925 ml  Net -2562 ml      Physical Exam    General:  Well appearing, elderly male sitting up in bed. No respiratory difficulty HEENT: normal Neck: supple. JVD ~8 cm. Carotids 2+ bilat; no bruits. No lymphadenopathy or thyromegaly appreciated. Cor: PMI nondisplaced. Regular rate & rhythm. No rubs, gallops or  murmurs. Lungs: clear Abdomen: soft, nontender, nondistended. No hepatosplenomegaly. No bruits or masses. Good bowel sounds. Extremities: no cyanosis, clubbing, rash, edema + rt groin hematoma (softer today)  Neuro: alert & oriented x 3, cranial nerves grossly intact. moves all 4 extremities w/o difficulty. Affect pleasant.   Telemetry   SB 50s    EKG    N/A  Labs    CBC Recent Labs    04/30/22 1130 05/01/22 0354 05/02/22 1442 05/03/22 0258  WBC 7.9   < > 9.8 8.6  NEUTROABS 5.8  --   --   --   HGB 11.0*   < > 9.0* 8.0*  HCT 34.1*   < > 27.0* 24.3*  MCV 95.0   < > 91.5 91.0  PLT 152   < > 152 121*   < > = values in this interval not displayed.   Basic Metabolic Panel Recent Labs    24/40/10 1130 05/01/22 0354 05/02/22 0037 05/03/22 0258  NA 140 139 140 139  K 4.8 4.1 4.5 3.9  CL 105 108 103 100  CO2 25 26 26 31   GLUCOSE 109* 109* 148* 119*  BUN 25* 24* 28* 30*  CREATININE 1.13 1.09 1.33* 1.45*  CALCIUM 9.1 8.7* 8.8* 8.9  MG 1.8 1.9  --   --    Liver Function Tests Recent Labs    04/30/22 1130  AST 35  ALT 17  ALKPHOS 76  BILITOT 1.5*  PROT 6.6  ALBUMIN 3.4*   No results for input(s): "LIPASE", "AMYLASE" in the last 72 hours. Cardiac Enzymes No results for input(s): "CKTOTAL", "CKMB", "CKMBINDEX", "TROPONINI" in the last 72 hours.  BNP: BNP (last 3 results) Recent Labs    04/30/22 1135  BNP 689.4*    ProBNP (last 3 results) No results for input(s): "PROBNP" in the last 8760 hours.   D-Dimer No results for input(s): "DDIMER" in the last 72 hours. Hemoglobin A1C Recent Labs    04/30/22 1135  HGBA1C 5.8*   Fasting Lipid Panel No results for input(s): "CHOL", "HDL", "LDLCALC", "TRIG", "CHOLHDL", "LDLDIRECT" in the last 72 hours. Thyroid Function Tests No results for input(s): "TSH", "T4TOTAL", "T3FREE", "THYROIDAB" in the last 72 hours.  Invalid input(s): "FREET3"  Other results:   Imaging    VAS [place] GROIN  PSEUDOANEURYSM  Result Date: 05/02/2022  ARTERIAL PSEUDOANEURYSM  Patient Name:  JIMAR THONE [facility]  Date of Exam:   05/02/2022 Medical Rec #: [id number]    Accession #:    [id number] Date of Birth: July 07, 1941    Patient Gender: M Patient Age:   81 years Exam Location:  [facility] Procedure:      VAS [place] GROIN PSEUDOANEURYSM Referring Phys: Fabian Sharp --------------------------------------------------------------------------------  Exam: Right groin Indications: Patient complains of bruising and palpable knot. History: S/p catheterization. Comparison Study: No prior. Performing Technologist: Oda Cogan RDMS, RVT  Examination Guidelines: A complete evaluation includes B-mode imaging, spectral Doppler, color Doppler, and power Doppler as needed of all accessible portions of each vessel. Bilateral testing is considered an integral part of a complete examination. Limited examinations for reoccurring indications may be performed as noted. +------------+----------+--------+------+----------+ Right DuplexPSV (cm/s)WaveformPlaqueComment(s) +------------+----------+--------+------+----------+ CFA             73    biphasic                 +------------+----------+--------+------+----------+ Prox SFA        91    biphasic                 +------------+----------+--------+------+----------+ Right Vein comments:Patent  Findings: A mixed echogenic structure measuring approximately 2.2 cm x 1.0 cm is visualized at the Right groin with ultrasound characteristics of a hematoma. No evidence of right groin pseudoaneurysm or AVF.  Diagnosing physician: Harold Barban MD Electronically signed by Harold Barban MD on 05/02/2022 at 9:55:41 PM.   --------------------------------------------------------------------------------    Final      Medications:     Scheduled Medications:  atorvastatin  40 mg Oral QHS   cholecalciferol  1,000 Units Oral BID   furosemide  80 mg Intravenous BID   insulin aspart  0-15  Units Subcutaneous TID WC   insulin aspart  0-5 Units Subcutaneous QHS   linagliptin  5 mg Oral Daily   sacubitril-valsartan  1 tablet Oral BID   sodium chloride flush  3 mL Intravenous Q12H   sodium chloride flush  3 mL Intravenous Q12H   spironolactone  25 mg Oral Daily    Infusions:  sodium chloride     sodium chloride      PRN Medications: sodium chloride, sodium chloride, acetaminophen, ondansetron (ZOFRAN) IV, mouth rinse, sodium chloride flush, sodium chloride flush    Patient Profile  81 y.o. male with history of permanent atrial fibrillation, b/l carotid artery stenosis, HTN, HLD, DM II.    Admitted post right heart cath for decompensated HFpEF with RV failure and pulmonary hypertension  Assessment/Plan   1. 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 RHC/LHC this admit showed no significant CAD, elevated right >>>left heart filling pressures with severe pulmonary arterial hypertension and preserved cardiac output. Good response to IV Lasix. Diuresed 16 lb. SCr 1.09>>1.33>>1.45 - Transition to PO diuretics today  - Continue spironolactone 25 mg daily.  - Continue Entresto 24-26 mg bid   - Eventually add SGLT2 inhibitor.  - Would not use Actos in future.  2. Pulmonary arterial hypertension: Severe PAH on RHC today, PVR 5.6 WU.  He has never smoked and has no known lung disease.  He has been on warfarin for atrial fibrillation long-term. No known rheumatologic illness.  Not a drinker though he has some signs of cirrhosis by prior liver imaging.  - V/Q scan negative for chronic PE  - High resolution CT chest today  - Sent serologic workup>>anti-SCL 70  neg, anti-centromere Ab neg, RF neg, ANA with reflex positive   - Will need sleep study as outpatient.  3. Atrial fibrillation: Permanent.   - Continue warfarin.  - Bradycardic, does not need nodal blocker.  4. DM2 - d/c home actos - add SGLT2i before d/c  5. Carotid stenosis: CTA neck in 6/23 showed high grade bilateral ICA stenoses.  - Continue statin.  - He is on warfarin for AF. - Severe PAH is going to be a significant risk for general anesthesia for CEA.  If it appears appropriate to start selective pulmonary vasodilators, I will try to titrate these up relatively rapidly then repeat RHC to see if we have been able to bring down his PA pressure before surgery.  6. Rt Groin Hematoma: post cath. Hgb 11.0>>10.0>>9.1>>8.7>>9.0>>8.0. [place] negative for PSA or AVF - resume coumadin tonight  - Follow CBC, transfuse for Hgb < 8.0   Length of Stay: 3  Brittainy Simmons, PA-C  05/03/2022, 7:27 AM  Advanced Heart Failure Team Pager [phone] (M-F; 7a - 5p)  Please contact [facility] for night-coverage after hours (5p -7a ) and weekends on [email or web address]  Patient seen with PA, agree with the above note.   No complaints this morning.  Right groin feels better, no dyspnea and walking in halls.   Creatinine mildly higher at 1.45.  ANA+, other serologies negative. Hgb 8 today.   I/Os -2562 negative, weight down 6 lbs.   General: NAD Neck: JVP 8-9 cm, no thyromegaly or thyroid nodule.  Lungs: Clear to auscultation bilaterally with normal respiratory effort. CV: Nondisplaced PMI.  Heart regular S1/S2, no S3/S4, no murmur.  Trace ankle edema.  Abdomen: Soft, nontender, no hepatosplenomegaly, no distention.  Skin: Intact without lesions or rashes.  Neurologic: Alert and oriented x 3.  Psych: Normal affect. Extremities: No clubbing or cyanosis.  HEENT: Normal.   To complete PH workup, he will need HRCT chest today to look for parenchymal disease then sleep study as outpatient.  Suspicion for  group 1 PH, will need workup of +ANA as well as outpatient (rheumatology referral).  - HRCT today.  - Will start tadalafil 20 mg daily.  - Will work on starting Opsumit 10 daily for combination therapy.   Volume status improved with weight down another 6 lbs.  - Stop IV Lasix.  -  Will start Farxiga 10 mg daily.  - Will start torsemide 40 mg daily.   BP is running low, SBP 100s this morning.  Will hold Entresto for now, may not need.   Groin hematoma improved.  Hgb 8 today.  - Repeat CBC in pm, transfuse hgb < 8.  - Will resume warfarin tonight without heparin.  - Will check Fe studies.   Loralie Champagne. 05/03/2022 8:48 AM

## 2022-05-03 NOTE — Progress Notes (Signed)
Physical Therapy Treatment Patient Details Name: Robert Baldwin MRN: [id number] DOB: 1940/10/31 Today's Date: 05/03/2022   History of Present Illness Pt is an 81 y/o M admitted for elective R/LHC and management of HFpEF with RV failure and pulmonary hypertension. PMH includes b/l carotid artery stenosis, DM II, HTN, HLD, and permanent atrial fibrillation.    PT Comments    Pt tolerates treatment well, ambulating for limited community distances. Pt reports mild dyspnea, although cites improvement during admission. Pt adamantly declines potential for use of supplemental oxygen. PT provides education for energy conservation. Acute PT will continue to follow.   Recommendations for follow up therapy are one component of a multi-disciplinary discharge planning process, led by the attending physician.  Recommendations may be updated based on patient status, additional functional criteria and insurance authorization.  Follow Up Recommendations  Home health PT     Assistance Recommended at Discharge Intermittent Supervision/Assistance  Patient can return home with the following A little help with walking and/or transfers;A little help with bathing/dressing/bathroom;Help with stairs or ramp for entrance;Assist for transportation   Equipment Recommendations  None recommended by PT    Recommendations for Other Services       Precautions / Restrictions Precautions Precautions: Other (comment) Precaution Comments: monitor O2 Restrictions Weight Bearing Restrictions: No     Mobility  Bed Mobility Overal bed mobility: Modified Independent                  Transfers Overall transfer level: Needs assistance Equipment used: None Transfers: Sit to/from Stand Sit to Stand: Supervision                Ambulation/Gait Ambulation/Gait assistance: Supervision Gait Distance (Feet): 250 Feet Assistive device: None Gait Pattern/deviations: Step-through pattern Gait velocity:  reduced Gait velocity interpretation: <1.8 ft/sec, indicate of risk for recurrent falls   General Gait Details: pt with shortened stride length   Stairs             Wheelchair Mobility    Modified Rankin (Stroke Patients Only)       Balance Overall balance assessment: Needs assistance Sitting-balance support: No upper extremity supported, Feet supported Sitting balance-Leahy Scale: Good     Standing balance support: No upper extremity supported, During functional activity Standing balance-Leahy Scale: Fair                              Cognition Arousal/Alertness: Awake/alert Behavior During Therapy: WFL for tasks assessed/performed Overall Cognitive Status: Within Functional Limits for tasks assessed                                          Exercises      General Comments General comments (skin integrity, edema, etc.): VSS on RA, pt denies SOB, refuses potential use of supplemental oxygen      Pertinent Vitals/Pain Pain Assessment Pain Assessment: No/denies pain    Home Living                          Prior Function            PT Goals (current goals can now be found in the care plan section) Acute Rehab PT Goals Patient Stated Goal: to return home Progress towards PT goals: Progressing toward goals    Frequency  Min 3X/week      PT Plan Current plan remains appropriate    Co-evaluation              AM-PAC PT "6 Clicks" Mobility   Outcome Measure  Help needed turning from your back to your side while in a flat bed without using bedrails?: None Help needed moving from lying on your back to sitting on the side of a flat bed without using bedrails?: None Help needed moving to and from a bed to a chair (including a wheelchair)?: A Little Help needed standing up from a chair using your arms (e.g., wheelchair or bedside chair)?: A Little Help needed to walk in hospital room?: A Little Help needed  climbing 3-5 steps with a railing? : A Little 6 Click Score: 20    End of Session   Activity Tolerance: Patient tolerated treatment well Patient left: with call bell/phone within reach;in bed;with bed alarm set Nurse Communication: Mobility status PT Visit Diagnosis: Unsteadiness on feet (R26.81);Other abnormalities of gait and mobility (R26.89);Muscle weakness (generalized) (M62.81)     Time: 6387-5643 PT Time Calculation (min) (ACUTE ONLY): 11 min  Charges:  $Gait Training: 8-22 mins                     Arlyss Gandy, PT, DPT Acute Rehabilitation Office [phone]    Arlyss Gandy 05/03/2022, 1:47 PM

## 2022-05-03 NOTE — Progress Notes (Signed)
Occupational Therapy Treatment Patient Details Name: Robert Baldwin MRN: [id number] DOB: 05/13/1941 Today's Date: 05/03/2022   History of present illness Pt is an 81 y/o M admitted for elective R/LHC and management of HFpEF with RV failure and pulmonary hypertension. PMH includes b/l carotid artery stenosis, DM II, HTN, HLD, and permanent atrial fibrillation.   OT comments  Pt is making progress towards goals was able to complete UE bathing with set up in standing with no LOB but noted slight postural sway and required sink to stabilize self. Pt completed UE dressing while sitting post set up of materials. Pt at this time needed supervision with mobility as cues on pacing self and self monitoring o2 reading on RA as lowest with mobility and ADLS was 80% but was able to use breathing techniques to bring back to 94%. Will continue to follow at this time.    Recommendations for follow up therapy are one component of a multi-disciplinary discharge planning process, led by the attending physician.  Recommendations may be updated based on patient status, additional functional criteria and insurance authorization.    Follow Up Recommendations  No OT follow up    Assistance Recommended at Discharge Intermittent Supervision/Assistance  Patient can return home with the following  Assistance with cooking/housework;Assist for transportation   Equipment Recommendations  None recommended by OT    Recommendations for Other Services      Precautions / Restrictions Precautions Precautions: Other (comment) Precaution Comments: monitor O2 Restrictions Weight Bearing Restrictions: No       Mobility Bed Mobility Overal bed mobility: Modified Independent             General bed mobility comments: HOB elevated but no physical support    Transfers Overall transfer level: Needs assistance Equipment used: None Transfers: Sit to/from Stand Sit to Stand: Supervision                 Balance  Overall balance assessment: Needs assistance Sitting-balance support: Feet supported Sitting balance-Leahy Scale: Good     Standing balance support: No upper extremity supported Standing balance-Leahy Scale: Fair Standing balance comment: noted slight postural sway and needed sink to stabilize when standing                           ADL either performed or assessed with clinical judgement   ADL Overall ADL's : Needs assistance/impaired Eating/Feeding: Independent;Sitting   Grooming: Wash/dry hands;Wash/dry face;Applying deodorant;Standing;Supervision/safety   Upper Body Bathing: Set up;Sitting   Lower Body Bathing: Set up;Sit to/from stand   Upper Body Dressing : Set up;Sitting   Lower Body Dressing: Set up;Sit to/from stand   Toilet Transfer: Set up;Cueing for safety;Cueing for sequencing;Rolling walker (2 wheels)   Toileting- Clothing Manipulation and Hygiene: Supervision/safety;Sit to/from stand       Functional mobility during ADLs: Supervision/safety      Extremity/Trunk Assessment Upper Extremity Assessment Upper Extremity Assessment: Overall WFL for tasks assessed   Lower Extremity Assessment Lower Extremity Assessment: Defer to PT evaluation        Vision   Vision Assessment?: No apparent visual deficits Additional Comments: wears glasses at baseline   Perception     Praxis      Cognition Arousal/Alertness: Awake/alert Behavior During Therapy: WFL for tasks assessed/performed Overall Cognitive Status: Within Functional Limits for tasks assessed  Exercises      Shoulder Instructions       General Comments cues on breathing techniques and pacing self with 02 readings, lowest on RA was 80% but about to recover to 94% in standing    Pertinent Vitals/ Pain       Pain Assessment Pain Assessment: No/denies pain  Home Living                                           Prior Functioning/Environment              Frequency  Min 2X/week        Progress Toward Goals  OT Goals(current goals can now be found in the care plan section)  Progress towards OT goals: Progressing toward goals  Acute Rehab OT Goals Patient Stated Goal: to get stronger OT Goal Formulation: With patient Time For Goal Achievement: 05/15/22 Potential to Achieve Goals: Good ADL Goals Additional ADL Goal #1: Pt will be able verbalize 3 enery conservation stratigies with ADLS Additional ADL Goal #2: Pt will be able to complete standing ADLs for 15 mins with no cues on breathing techniques to keep o2 readins above 90%  Plan Discharge plan remains appropriate    Co-evaluation                 AM-PAC OT "6 Clicks" Daily Activity     Outcome Measure   Help from another person eating meals?: None Help from another person taking care of personal grooming?: None Help from another person toileting, which includes using toliet, bedpan, or urinal?: A Little Help from another person bathing (including washing, rinsing, drying)?: A Little Help from another person to put on and taking off regular upper body clothing?: None Help from another person to put on and taking off regular lower body clothing?: A Little 6 Click Score: 21    End of Session Equipment Utilized During Treatment: Gait belt  OT Visit Diagnosis: Muscle weakness (generalized) (M62.81)   Activity Tolerance Patient tolerated treatment well   Patient Left in bed;with call bell/phone within reach   Nurse Communication          Time: 9528-4132 OT Time Calculation (min): 31 min  Charges: OT General Charges $OT Visit: 1 Visit OT Treatments $Self Care/Home Management : 23-37 mins  Alphia Moh OTR/L  Acute Rehab Services  [phone] office number [phone] pager number   Alphia Moh 05/03/2022, 11:48 AM

## 2022-05-03 NOTE — Progress Notes (Signed)
   Afternoon CBC stable.    No change.   Shakedra Beam NP-C  3:37 PM

## 2022-05-03 NOTE — TOC Initial Note (Addendum)
Transition of Care [facility]) - Initial/Assessment Note    Patient Details  Name: Robert Baldwin MRN: [id number] Date of Birth: [birth year]  Transition [facility]) CM/SW Contact:    Elliot Cousin, RN Phone Number: [phone] 05/03/2022, 4:45 PM  Clinical Narrative:                 HF TOC CM spoke to pt at bedside. Offered choice for HH, and Pt states he does not feel he will need HH. Lives at home with wife, and was independent prior to hospital stay. Has cane and scale at home. Will continue to follow for dc needs.   Expected Discharge Plan: Home/Self Care Barriers to Discharge: Continued Medical Work up   Patient Goals and CMS Choice Patient states their goals for this hospitalization and ongoing recovery are:: wants to get back to feeling good CMS Medicare.gov Compare Post Acute Care list provided to:: Patient Choice offered to / list presented to : Patient  Expected Discharge Plan and Services Expected Discharge Plan: Home/Self Care   Discharge Planning Services: CM Consult Post Acute Care Choice: Home Health Living arrangements for the past 2 months: Single Family Home                                      Prior Living Arrangements/Services Living arrangements for the past 2 months: Single Family Home Lives with:: Spouse Patient language and need for interpreter reviewed:: Yes Do you feel safe going back to the place where you live?: Yes      Need for Family Participation in Patient Care: No (Comment) Care giver support system in place?: No (comment) Current home services: DME (cane) Criminal Activity/Legal Involvement Pertinent to Current Situation/Hospitalization: No - Comment as needed  Activities of Daily Living Home Assistive Devices/Equipment: None ADL Screening (condition at time of admission) Patient's cognitive ability adequate to safely complete daily activities?: Yes Is the patient deaf or have difficulty hearing?: Yes Does the patient have  difficulty seeing, even when wearing glasses/contacts?: No Does the patient have difficulty concentrating, remembering, or making decisions?: No Patient able to express need for assistance with ADLs?: Yes Does the patient have difficulty dressing or bathing?: Yes Independently performs ADLs?: Yes (appropriate for developmental age) Does the patient have difficulty walking or climbing stairs?: Yes Weakness of Legs: Both Weakness of Arms/Hands: None  Permission Sought/Granted Permission sought to share information with : Case Manager, Family Supports, PCP Permission granted to share information with : Yes, Verbal Permission Granted  Share Information with NAME: Robert Baldwin  Permission granted to share info w AGENCY: Home Health, DME  Permission granted to share info w Relationship: wife  Permission granted to share info w Contact Information: [phone]  Emotional Assessment Appearance:: Appears stated age Attitude/Demeanor/Rapport: Engaged Affect (typically observed): Accepting Orientation: : Oriented to Self, Oriented to Place, Oriented to  Time, Oriented to Situation   Psych Involvement: No (comment)  Admission diagnosis:  Severe pulmonary hypertension (HCC) [I27.20] 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 - [place], [place] - [address] [place] [address] Phone: [phone] Fax: [phone]  [facility] - [place], [place] - [address] [place] [address] Phone: [phone] Fax: [phone]     Social Determinants of Health (SDOH) Interventions    Readmission Risk Interventions     No data to display

## 2022-05-03 NOTE — TOC Benefit Eligibility Note (Signed)
Patient Product/process development scientist completed.    The patient is currently admitted and upon discharge could be taking Farxiga 10 mg.  The current 30 day co-pay is, $345.62 due to being in Coverage Gap (donut hole).   The patient is insured through [employer] Medicare Part D    Roland Earl, CPhT Pharmacy Patient Advocate Specialist [facility] Patient Advocate Team Direct Number: [phone]  Fax: [phone]

## 2022-05-03 NOTE — Telephone Encounter (Signed)
Pharmacy Patient Advocate Encounter  Insurance verification completed.    The patient is insured through [employer] Medicare Part D   The patient is currently admitted and ran test claims for the following: Farxiga.  Copays and coinsurance results were relayed to Inpatient clinical team.  

## 2022-05-03 NOTE — Progress Notes (Signed)
ANTICOAGULATION CONSULT NOTE  Pharmacy Consult for warfarin Indication: atrial fibrillation  No Known Allergies  Patient Measurements: Height: 5\' 7"  (170.2 cm) Weight: 85.4 kg (188 lb 4.4 oz) IBW/kg (Calculated) : 66.1 Heparin Dosing Weight: 92kg  Vital Signs: Temp: 97.9 F (36.6 C) (07/13 0810) Temp Source: Oral (07/13 0810) BP: 104/38 (07/13 0810)  Labs: Recent Labs    05/01/22 0354 05/01/22 1234 05/01/22 2207 05/01/22 2356 05/02/22 0037 05/02/22 0805 05/02/22 1442 05/03/22 0258  HGB 10.0*  --   --    < >  --  8.7* 9.0* 8.0*  HCT 30.5*  --   --    < >  --  26.8* 27.0* 24.3*  PLT 160  --   --    < >  --  149* 152 121*  LABPROT 18.8*  --   --   --  18.7*  --   --  20.5*  INR 1.6*  --   --   --  1.6*  --   --  1.8*  HEPARINUNFRC 0.90* >1.10* 0.74*  --   --  0.49  --   --   CREATININE 1.09  --   --   --  1.33*  --   --  1.45*   < > = values in this interval not displayed.     Estimated Creatinine Clearance: 42.4 mL/min (A) (by C-G formula based on SCr of 1.45 mg/dL (H)).   Medical History: Past Medical History:  Diagnosis Date   Atrial fibrillation (HCC)    Hyperlipidemia    Hypertension    Type 2 diabetes mellitus (HCC)      Assessment: 47 yoM with hx AFib on warfarin PTA admitted for elective R/LHC. Pharmacy asked to start IV heparin after sheath removal and resume warfarin (last day PTA 7/6, holding pre-cath), then held briefly on 7/12 with worsening groin hematoma.  Bleeding has resolved, pharmacy asked to resume warfarin without heparin bridge.  Home warfarin dose 5mg  daily except 7.5mg  Sun/Thurs  Goal of Therapy:  INR 2-3 Monitor platelets by anticoagulation protocol: Yes   Plan:  -Warfarin 7.5mg  PO x1 tonight -Daily PT/INR    9/12, PharmD, BCPS, [facility] Clinical Pharmacist [phone] Please check AMION for all [facility] Pharmacy numbers 05/03/2022

## 2022-05-03 NOTE — Telephone Encounter (Signed)
Patient Advocate Encounter  Was successful in securing patient an $10,000 grant from [employer] to provide copayment coverage for Opsumit.  This will keep the out of pocket expense at $0.      Member ID: [id number] Group ID: [id number] RxBin: [id number] Dates of Eligibility: 04/03/22 through 04/03/23  Fund:  Pulmonary Hypertension   Karle Plumber, PharmD, BCPS, BCCP, CPP Heart Failure Clinic Pharmacist [phone]

## 2022-05-03 NOTE — Telephone Encounter (Signed)
Sent in Opsumit enrollment application and Opsumit REMS enrollment form to [employer].    Karle Plumber, PharmD, BCPS, BCCP, CPP Heart Failure Clinic Pharmacist [phone]

## 2022-05-03 NOTE — Progress Notes (Addendum)
New hematoma noted at right radial cath site with increased ecchymosis (without changes to currently existing hematoma) at right femoral cath site. Distal neurovascular integrity confirmed by ruling out changes in sensation, function, and perfusion. On call cardiology NP notified, on call cardiology physician made aware. Received verbal order to monitor for changes and maintain pressure dressing on right radial site (right femoral site pressure dressing remains in place clean dry and intact).  Hematoma at radial and femoral sites reassessed at 2100 and 2220; no discernable changes. Palpable pulses on affected extremities with good O2 saturation and waveform on thumb of affected arm. Patient expresses emotional distress citing the uncertainty of treatment progress but remains free of physical distress; emotional support and bath provided. Radial pressure dressing is unsoiled but replaced after assessment.  Ultrasound order placed with laterality specified as left lower extremity; three cath sites and two hematomas are present, all of which are on the right extremities. On call physician notified; verbal order received to monitor sites overnight and allow AM team to evaluate need for ultrasound as the patient is currently hemodynamically stable.

## 2022-05-04 ENCOUNTER — Other Ambulatory Visit (HOSPITAL_COMMUNITY): Payer: Self-pay

## 2022-05-04 ENCOUNTER — Other Ambulatory Visit (HOSPITAL_COMMUNITY): Payer: Self-pay | Admitting: Cardiology

## 2022-05-04 DIAGNOSIS — I272 Pulmonary hypertension, unspecified: Secondary | ICD-10-CM

## 2022-05-04 DIAGNOSIS — R768 Other specified abnormal immunological findings in serum: Secondary | ICD-10-CM

## 2022-05-04 LAB — BASIC METABOLIC PANEL
Anion gap: 12 (ref 5–15)
BUN: 35 mg/dL — ABNORMAL HIGH (ref 8–23)
CO2: 29 mmol/L (ref 22–32)
Calcium: 8.7 mg/dL — ABNORMAL LOW (ref 8.9–10.3)
Chloride: 99 mmol/L (ref 98–111)
Creatinine, Ser: 1.63 mg/dL — ABNORMAL HIGH (ref 0.61–1.24)
GFR, Estimated: 42 mL/min — ABNORMAL LOW (ref >60)
Glucose, Bld: 117 mg/dL — ABNORMAL HIGH (ref 70–99)
Potassium: 4.4 mmol/L (ref 3.5–5.1)
Sodium: 140 mmol/L (ref 135–145)

## 2022-05-04 LAB — IRON AND TIBC
Iron: 28 ug/dL — ABNORMAL LOW (ref 45–182)
Saturation Ratios: 9 % — ABNORMAL LOW (ref 17.9–39.5)
TIBC: 318 ug/dL (ref 250–450)
UIBC: 290 ug/dL

## 2022-05-04 LAB — PROTIME-INR
INR: 1.6 — ABNORMAL HIGH (ref 0.8–1.2)
Prothrombin Time: 19.1 seconds — ABNORMAL HIGH (ref 11.4–15.2)

## 2022-05-04 LAB — GLUCOSE, CAPILLARY
Glucose-Capillary: 127 mg/dL — ABNORMAL HIGH (ref 70–99)
Glucose-Capillary: 158 mg/dL — ABNORMAL HIGH (ref 70–99)
Glucose-Capillary: 117 mg/dL — ABNORMAL HIGH (ref 70–99)
Glucose-Capillary: 162 mg/dL — ABNORMAL HIGH (ref 70–99)

## 2022-05-04 LAB — CBC
HCT: 25.6 % — ABNORMAL LOW (ref 39.0–52.0)
Hemoglobin: 8.4 g/dL — ABNORMAL LOW (ref 13.0–17.0)
MCH: 30.8 pg (ref 26.0–34.0)
MCHC: 32.8 g/dL (ref 30.0–36.0)
MCV: 93.8 fL (ref 80.0–100.0)
Platelets: 153 10*3/uL (ref 150–400)
RBC: 2.73 MIL/uL — ABNORMAL LOW (ref 4.22–5.81)
RDW: 16.2 % — ABNORMAL HIGH (ref 11.5–15.5)
WBC: 8.9 10*3/uL (ref 4.0–10.5)
nRBC: 0 % (ref 0.0–0.2)

## 2022-05-04 LAB — FERRITIN: Ferritin: 63 ng/mL (ref 24–336)

## 2022-05-04 MED ORDER — SPIRONOLACTONE 12.5 MG HALF TABLET
12.5000 mg | ORAL_TABLET | Freq: Every day | ORAL | Status: DC
  Administered 2022-05-05 – 2022-05-08 (×4): 12.5 mg via ORAL
  Filled 2022-05-04 (×4): qty 1

## 2022-05-04 MED ORDER — WARFARIN SODIUM 7.5 MG PO TABS
7.5000 mg | ORAL_TABLET | Freq: Once | ORAL | Status: AC
  Administered 2022-05-04: 7.5 mg via ORAL
  Filled 2022-05-04: qty 1

## 2022-05-04 MED ORDER — TADALAFIL (PAH) 20 MG PO TABS
40.0000 mg | ORAL_TABLET | Freq: Every day | ORAL | 5 refills | Status: DC
  Filled 2022-05-04: qty 60, 30d supply, fill #0

## 2022-05-04 MED ORDER — TADALAFIL 20 MG PO TABS
40.0000 mg | ORAL_TABLET | Freq: Every day | ORAL | Status: DC
  Administered 2022-05-05 – 2022-05-08 (×4): 40 mg via ORAL
  Filled 2022-05-04 (×6): qty 2

## 2022-05-04 MED ORDER — SODIUM CHLORIDE 0.9 % IV SOLN
510.0000 mg | Freq: Once | INTRAVENOUS | Status: AC
  Administered 2022-05-04: 510 mg via INTRAVENOUS
  Filled 2022-05-04: qty 510

## 2022-05-04 MED ORDER — TADALAFIL 20 MG PO TABS
20.0000 mg | ORAL_TABLET | Freq: Once | ORAL | Status: AC
  Administered 2022-05-04: 20 mg via ORAL
  Filled 2022-05-04: qty 1

## 2022-05-04 NOTE — Plan of Care (Signed)
  Problem: Education: Goal: Understanding of CV disease, CV risk reduction, and recovery process will improve Outcome: Progressing   Problem: Activity: Goal: Ability to return to baseline activity level will improve Outcome: Progressing   Problem: Cardiovascular: Goal: Ability to achieve and maintain adequate cardiovascular perfusion will improve Outcome: Progressing   

## 2022-05-04 NOTE — Progress Notes (Addendum)
Advanced Heart Failure Rounding Note  PCP-Cardiologist: [name], MD   Subjective:   Admitted post right heart cath for decompensated HFpEF with RV failure and severe pulmonary arterial hypertension.   V/Q scan negative for chronic PE. ANA+. All other serologic test -.   HRCT completed yesterday. Radiologist interpretation pending.   Has groin hematoma. Hgb stable, 8.4 today. Back on coumadin. INR 1.6.   Transitioned to torsemide yesterday. 1.3L in UOP. Wt down 1 lb. Entresto stopped due to soft BPs.   SCr 1.09>>1.33>>1.45>>1.63.  K 4.4   Feels ok this morning. No dyspnea. + sore throat. Worried about condition/prognosis.    RHC/LHC RA (mean): 25 mmHg RV (S/EDP): 115/25 mmHg PA (S/D, mean): 115/30 (58) mmHg PCWP (mean): Not obtained LVEDP 22 Ao sat: 97% PA sat: 70% PVR 5.6 WU (using LVEDP rather than PCWP to calculate) Fick CO: 6.4 L/min Fick CI: 3.1 L/min/m^2    Objective:   Weight Range: 85 kg Body mass index is 29.35 kg/m.   Vital Signs:   Temp:  [97.6 F (36.4 C)-99.6 F (37.6 C)] 98.7 F (37.1 C) (07/14 0445) Pulse Rate:  [34-58] 34 (07/14 0034) Resp:  [17-19] 19 (07/14 0445) BP: (102-117)/(38-53) 117/52 (07/14 0445) SpO2:  [90 %-94 %] 90 % (07/14 0445) Weight:  [85 kg] 85 kg (07/14 0445) Last BM Date : 05/03/22  Weight change: Filed Weights   05/02/22 0354 05/03/22 0641 05/04/22 0445  Weight: 88.2 kg 85.4 kg 85 kg    Intake/Output:   Intake/Output Summary (Last 24 hours) at 05/04/2022 0713 Last data filed at 05/04/2022 0451 Gross per 24 hour  Intake 844 ml  Output 1250 ml  Net -406 ml      Physical Exam    General:  Well appearing. No respiratory difficulty HEENT: normal Neck: supple. JVD 10 cm. Carotids 2+ bilat; no bruits. No lymphadenopathy or thyromegaly appreciated. Cor: PMI nondisplaced. Regular rate & rhythm. No rubs, gallops or murmurs. Lungs: clear Abdomen: soft, nontender, nondistended. No hepatosplenomegaly. No  bruits or masses. Good bowel sounds. Extremities: no cyanosis, clubbing, rash, edema + rt femoral groin hematoma  Neuro: alert & oriented x 3, cranial nerves grossly intact. moves all 4 extremities w/o difficulty. Affect pleasant.   Telemetry   SB 50s    EKG    N/A  Labs    CBC Recent Labs    05/03/22 2118 05/04/22 0414  WBC 9.7 8.9  HGB 8.3* 8.4*  HCT 25.6* 25.6*  MCV 93.1 93.8  PLT 144* 153   Basic Metabolic Panel Recent Labs    01/60/10 0258 05/04/22 0414  NA 139 140  K 3.9 4.4  CL 100 99  CO2 31 29  GLUCOSE 119* 117*  BUN 30* 35*  CREATININE 1.45* 1.63*  CALCIUM 8.9 8.7*   Liver Function Tests No results for input(s): "AST", "ALT", "ALKPHOS", "BILITOT", "PROT", "ALBUMIN" in the last 72 hours.  No results for input(s): "LIPASE", "AMYLASE" in the last 72 hours. Cardiac Enzymes No results for input(s): "CKTOTAL", "CKMB", "CKMBINDEX", "TROPONINI" in the last 72 hours.  BNP: BNP (last 3 results) Recent Labs    04/30/22 1135  BNP 689.4*    ProBNP (last 3 results) No results for input(s): "PROBNP" in the last 8760 hours.   D-Dimer No results for input(s): "DDIMER" in the last 72 hours. Hemoglobin A1C No results for input(s): "HGBA1C" in the last 72 hours.  Fasting Lipid Panel No results for input(s): "CHOL", "HDL", "LDLCALC", "TRIG", "CHOLHDL", "LDLDIRECT" in the  last 72 hours. Thyroid Function Tests No results for input(s): "TSH", "T4TOTAL", "T3FREE", "THYROIDAB" in the last 72 hours.  Invalid input(s): "FREET3"  Other results:   Imaging    No results found.   Medications:     Scheduled Medications:  atorvastatin  40 mg Oral QHS   cholecalciferol  1,000 Units Oral BID   dapagliflozin propanediol  10 mg Oral Daily   insulin aspart  0-15 Units Subcutaneous TID WC   insulin aspart  0-5 Units Subcutaneous QHS   linagliptin  5 mg Oral Daily   sodium chloride flush  3 mL Intravenous Q12H   sodium chloride flush  3 mL Intravenous Q12H    spironolactone  25 mg Oral Daily   tadalafil  20 mg Oral Daily   torsemide  40 mg Oral Daily   Warfarin - Pharmacist Dosing Inpatient   Does not apply q1600    Infusions:  sodium chloride     sodium chloride      PRN Medications: sodium chloride, sodium chloride, acetaminophen, ondansetron (ZOFRAN) IV, mouth rinse, sodium chloride flush, sodium chloride flush    Patient Profile  81 y.o. male with history of permanent atrial fibrillation, b/l carotid artery stenosis, HTN, HLD, DM II.    Admitted post right heart cath for decompensated HFpEF with RV failure and pulmonary hypertension  Assessment/Plan   1. 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 RHC/LHC this admit showed no significant CAD, elevated right >>>left heart filling pressures with severe pulmonary arterial hypertension and preserved cardiac output. Good response to IV Lasix. Diuresed 17 lb. Now on PO diuretics. SCr 1.09>>1.33>>1.45>>1.63 - Continue torsemide 40 mg daily  - Continue spironolactone 25 mg daily.  - Off Entresto w/ soft BP  - Continue Farxiga 10 mg daily  - Would not use Actos in future.  2. Pulmonary arterial hypertension: Severe PAH on RHC today, PVR 5.6 WU.  He has never smoked and has no known lung disease.  He has been on warfarin for atrial fibrillation long-term. No known rheumatologic illness.  Not a drinker though he has some signs of cirrhosis by prior liver imaging.  - V/Q scan negative for chronic PE  - High resolution CT chest completed, no definite ILD with probable mild pulmonary hypertension.  - Sent serologic workup>>anti-SCL 70 neg, anti-centromere Ab neg, RF neg,  ANA with reflex positive. 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 Will need rheumatology referral for further w/u - He has been started on Tadalafil 20 mg daily. Increase to 40 mg daily - Will work on starting Opsumit 10 daily for combination therapy, will do as outpatient. .  - Will need sleep study as outpatient.  3. Atrial fibrillation: Permanent.   - Continue warfarin.  - Bradycardic, does not need nodal blocker.  4. DM2 - d/c home actos - Farxiga started this admit  5. Carotid stenosis: CTA neck in 6/23 showed high grade bilateral ICA stenoses.  - Continue statin.  - He is on warfarin for AF. - Severe PAH is going to be a significant risk for general anesthesia for CEA.  If it appears appropriate to start selective pulmonary vasodilators, I will try to titrate these up relatively rapidly then repeat RHC to see if we  have been able to bring down his PA pressure before surgery.  6. Rt Groin Hematoma/Anemia: post cath. Hgb 11.0>>10.0>>9.1>>8.7>>9.0>>8.0>>8.4. [place] negative for PSA or AVF - Follow CBC, transfuse for Hgb < 8.0  - Check Iron studies   Watch renal function today. If remains stable, plan d/c home likely tomorrow.   Length of Stay: [address], PA-C  05/04/2022, 7:13 AM  Advanced Heart Failure Team Pager [phone] (M-F; 7a - 5p)  Please contact [facility] for night-coverage after hours (5p -7a ) and weekends on [email or web address]  Patient seen with PA, agree with the above note.   Creatinine mildly higher today at 1.6, hgb higher at 8.4.  Patient denies dyspnea.   General: NAD Neck: JVP 8-9 cm, no thyromegaly or thyroid nodule.  Lungs: Clear to auscultation bilaterally with normal respiratory effort. CV: Nondisplaced PMI.  Heart regular S1/S2, no S3/S4, no murmur.  No peripheral edema.  Abdomen: Soft, nontender, no hepatosplenomegaly, no distention.  Skin: Intact without lesions or rashes.  Neurologic: Alert and oriented x 3.  Psych: Normal affect. Extremities: No clubbing or  cyanosis. Right groin hematoma.  HEENT: Normal.   Suspect mild residual volume overload, but weight overall down.  With creatinine up to 1.6, he is now on po torsemide (continue current dose).  Will decrease spironolactone to 12.5 mg daily.    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  Increase tadalafil to 40 mg daily today.  Will work on [employer] as outpatient.  Needs repeat RHC in a few weeks as outpatient on PH meds and diuresed.  He needs CEA, needs lower PA pressure for safe general anesthesia.   Groin hematoma looks stable and hgb higher today.  Continue warfarin without heparin overlap.  Will order feraheme today.   If creatinine and hgb stable tomorrow, think he can go home with close CHF clinic followup.   Avo Schlachter McLeant 05/04/2022 1:50 PM

## 2022-05-04 NOTE — Progress Notes (Signed)
Mobility Specialist Progress Note:   05/04/22 1140  Mobility  Activity Ambulated with assistance in hallway  Level of Assistance Standby assist, set-up cues, supervision of patient - no hands on  Assistive Device None  Distance Ambulated (ft) 300 ft  Activity Response Tolerated well  $Mobility charge 1 Mobility   Pt received in bed willing to participate in mobility. No complaints of pain. Left EOB with call bell in reach and all needs met.   [facility] Nayellie Sanseverino Mobility Specialist

## 2022-05-04 NOTE — Progress Notes (Signed)
Ambulatory referral to rheumatology placed.   Robbie Lis, PA-C 05/04/2022

## 2022-05-04 NOTE — TOC CM/SW Note (Addendum)
HF TOC CM received referral to arrange Appt with Dr Dierdre Forth, [facility]. Office closed today at 12 noon. Faxed ambulatory referral requesting an appt with Dr Dierdre Forth or first available. Will follow up on Monday to ensure referral was received. Isidoro Donning RN3 CCM, Heart Failure TOC CM [phone]

## 2022-05-04 NOTE — Care Management Important Message (Signed)
Important Message  Patient Details  Name: Robert Baldwin MRN: [id number] Date of Birth: 18-Sep-1941   Medicare Important Message Given:  Yes     [name], [name] 05/04/2022, 9:12 AM

## 2022-05-04 NOTE — Progress Notes (Signed)
ANTICOAGULATION CONSULT NOTE  Pharmacy Consult for warfarin Indication: atrial fibrillation  No Known Allergies  Patient Measurements: Height: 5\' 7"  (170.2 cm) Weight: 85 kg (187 lb 6.4 oz) IBW/kg (Calculated) : 66.1 Heparin Dosing Weight: 92kg  Vital Signs: Temp: 98.6 F (37 C) (07/14 0805) Temp Source: Oral (07/14 0805) BP: 112/42 (07/14 0846) Pulse Rate: 55 (07/14 0805)  Labs: Recent Labs    05/01/22 1234 05/01/22 2207 05/01/22 2356 05/02/22 0037 05/02/22 0805 05/02/22 1442 05/03/22 0258 05/03/22 1429 05/03/22 2118 05/04/22 0414  HGB  --   --    < >  --  8.7*   < > 8.0* 9.1* 8.3* 8.4*  HCT  --   --    < >  --  26.8*   < > 24.3* 27.7* 25.6* 25.6*  PLT  --   --    < >  --  149*   < > 121* 146* 144* 153  LABPROT  --   --   --  18.7*  --   --  20.5*  --   --  19.1*  INR  --   --   --  1.6*  --   --  1.8*  --   --  1.6*  HEPARINUNFRC >1.10* 0.74*  --   --  0.49  --   --   --   --   --   CREATININE  --   --   --  1.33*  --   --  1.45*  --   --  1.63*   < > = values in this interval not displayed.     Estimated Creatinine Clearance: 37.7 mL/min (A) (by C-G formula based on SCr of 1.63 mg/dL (H)).   Medical History: Past Medical History:  Diagnosis Date   Atrial fibrillation (HCC)    Hyperlipidemia    Hypertension    Type 2 diabetes mellitus (HCC)      Assessment: 32 yoM with hx AFib on warfarin PTA admitted for elective R/LHC. Pharmacy asked to start IV heparin after sheath removal and resume warfarin (last day PTA 7/6, holding pre-cath), then held briefly on 7/12 with worsening groin hematoma.  Bleeding has resolved, pharmacy asked to resume warfarin without heparin bridge. INR today is subtherapeutic at 1.6, CBC remains stable.  Home warfarin dose 5mg  daily except 7.5mg  Sun/Thurs  Goal of Therapy:  INR 2-3 Monitor platelets by anticoagulation protocol: Yes   Plan:  -Warfarin 7.5mg  PO x1 tonight -Daily PT/INR    9/12, PharmD, BCPS,  [facility] Clinical Pharmacist [phone] Please check AMION for all [facility] Pharmacy numbers 05/04/2022

## 2022-05-04 NOTE — Discharge Summary (Signed)
Advanced Heart Failure Team  Discharge Summary   Patient ID: Robert Baldwin MRN: [id number], DOB/AGE: 28-Jul-1941 81 y.o. 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 male with history of b/l carotid artery stenosis, DM II, HTN, HLD, permanent atrial fibrillation. He follows with Dr. Diona Browner in the cardiology clinic.    Saw [facility] for carotid artery stenosis 06/21. Was noting more dyspnea with exertion and lower extremity edema for about a month. He was subsequently started on po lasix 20 mg daily.   He was seen by Dr. Diona Browner in the clinic 07/06 for evaluation of recent symptoms and pre-operative evaluation in preparation for left CEA followed by staged right CEA. Echo day of the visit EF 70-75%, hyperdynamic LV function, mild LVH, interventricular septum flattened in systole and diastole consistent with RV pressure and volume overload, RV moderately enlarged and moderately reduced, RVSP 120 mmHg, moderate LAE, severe RAE, moderate pericardial effusion, severe TR, dilated IVC.Patient was scheduled for outpatient R/LHC to better assess hemodynamics and further characterize pulmonary hypertension.    Presented 04/30/22 for R/LHC. Operator unable to obtain access via right radial artery (heavily calcified). Access archived via rt femoral artery. Cath showed no significant CAD, RA mean 25, PA 115/30 (58), LVEDP 22 mmHg, PCWP not obtained, Fick CO/CI 6.4/3.1, PAPi 3.4. PVR 5.6 WU.    He was direct admitted for optimization and management of HFpEF with RV  failure and pulmonary hypertension. AHF team consulted.   He diuresed w/ IV Lasix and underwent further w/u for PH. V/Q scan negative for chronic PE. High resolution chest CT showed dilation of the pulmonary truck (4.4 cm in diameter), compatible w/ PH. High-resolution images demonstrate a background of very mild diffuse ground-glass attenuation scattered throughout the lungs bilaterally. Minimal interlobular septal thickening is noted, predominantly in the mid to lower lungs. Minimal subpleural reticulation in the extreme lung bases. No traction bronchiectasis or honeycombing. Inspiratory and expiratory imaging demonstrates mild air trapping indicative of small airways disease. No acute consolidative airspace disease. No pleural effusions. No suspicious appearing pulmonary nodules or masses are noted. The possibility of interstitial lung disease is not excluded, but not strongly favored at this time. If persistent clinical suspicion, can repeat HRCT in 12 months.    Serologic testing was also performed. ANA was positive but all other serologic test  were negative. 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 He will need rheumatology referral for further w/u at discharge.   He was started on tadalafil 20 mg daily, which was ultimately titrated to 40 mg. Pharmacy started insurance authorization for Opsumit 10 daily in anticipation of combination therapy.  After diuresis w/ IV Lasix, he was transitioned to PO torsemide and started on Farxiga. Overall diuresed 24 pounds. He was tried on Entresto but this was later discontinued due to soft BPs.   To complete PH w/u, he will also need a sleep study to assess for OSA. This will be arranged through the [facility]. He needs repeat RHC in a few weeks as outpatient on PH meds and diuresed.  He  needs CEA, needs lower PA pressure for safe general anesthesia.   Also of note, pt developed rt femoral hematoma post cath w/ subsequent drop in Hgb 7.9.  [place] negative for PSA or AVF.  Heparin gtt was discontinued and coumadin held x 1 day. Additional manual pressure was held. Hgb improved w/ upward trend. Fe studies c/w IDA. Treated w/ feraheme. Given 1UPRBCs with appropriate rise    Evaluated by PT prior to d/c. HHPT recommended. Orders placed. Meds sent to [facility] pharmacy. Also had home oxygen delivered hypoxia at rest/exertion.   He will continued to be followed closely in the HF clinic. Plan to repeat cath down the road after being on combination therapy.   Cardiac/ Vascular Studies   2D Echo 04/26/22 Left ventricular ejection fraction, by estimation, is 70 to 75%. The left ventricle has hyperdynamic function. The left ventricle has no regional wall motion abnormalities. There is mild left ventricular hypertrophy. Left ventricular diastolic parameters are indeterminate. There is the interventricular septum is flattened in systole and diastole, consistent with right ventricular pressure and volume overload. The average left ventricular global longitudinal strain is -24.0 %. The global longitudinal strain is normal. 1. Right ventricular systolic function is moderately reduced. The right ventricular size is moderately enlarged. Mildly increased right ventricular wall thickness. There is severely elevated pulmonary artery systolic pressure. The estimated right ventricular systolic pressure is 120.3 mmHg. 2. 3. Left atrial size was moderately dilated. 4. Right atrial size was severely dilated. Right pleural effusion noted. Moderate pericardial effusion. The pericardial effusion is posterior to the left ventricle and localized near the right atrium. Small collection anterolaterally. 5. The mitral valve is abnormal. Trivial mitral valve regurgitation. Moderate mitral annular calcification. 6. 7. Tricuspid valve regurgitation is severe. The aortic valve is tricuspid. Aortic valve regurgitation is not visualized. Aortic valve sclerosis/calcification is present, without any  evidence of aortic stenosis. Aortic valve mean gradient measures 6.7 mmHg.  R/LHC 04/30/22 Conclusions: No angiographically significant coronary artery disease. Severely elevated pulmonary artery and right heart filling pressures (mean PA 58 mmHg, mean RA 25 mmHg). Mildly elevated left heart filling pressure (LVEDP 22 mmHg). Normal Fick cardiac output/index (CO 6.4 L/min, CI 3.1 L/min/m). Small, heavily calcified right radial artery precluding catheter advancement from this approach.  Recommend alternative access if future catheterizations are needed.  LE Arterial Vasc US 05/02/22  Findings: A mixed echogenic structure measuring approximately 2.2 cm x 1.0 cm is visualized at the Right groin with ultrasound characteristics of a hematoma. No evidence of right groin pseudoaneurysm or AVF.   V/Q Scan 05/01/22 IMPRESSION: Pulmonary embolism absent.  HRCT of Chest 05/03/22  IMPRESSION: 1. Overall, the appearance of the chest is most indicative of congestive heart failure, as above. 2. The possibility of interstitial lung disease is not excluded, but not strongly favored at this time. If there is persistent clinical concern for interstitial lung disease, repeat high-resolution chest CT could be considered in 12 months to assess for temporal changes in the appearance of the lung parenchyma. 3. Dilatation of the pulmonic trunk (4.4 cm in diameter), compatible with reported clinical history of pulmonary arterial hypertension. 4. Small amount of pericardial fluid and/or thickening, unlikely to be of hemodynamic significance at this time. 5. Aortic atherosclerosis, in addition to three-vessel coronary artery disease. 6. There are calcifications of the mitral annulus. Echocardiographic correlation for evaluation of potential valvular dysfunction may be warranted if clinically indicated.  Discharge Weight : 180 pounds.  Discharge Vitals: Blood pressure (!) 117/42, pulse 89,  temperature 98 F  (36.7 C), temperature source Oral, resp. rate 18, height 5\' 7"  (1.702 m), weight 81.6 kg, SpO2 90 %.  Labs: Lab Results  Component Value Date   WBC 9.4 05/08/2022   HGB 9.1 (L) 05/08/2022   HCT 27.3 (L) 05/08/2022   MCV 92.2 05/08/2022   PLT 205 05/08/2022    Recent Labs  Lab 05/08/22 0414  NA 136  K 3.8  CL 97*  CO2 31  BUN 39*  CREATININE 1.53*  CALCIUM 8.6*  GLUCOSE 116*   No results found for: "CHOL", "HDL", "LDLCALC", "TRIG" BNP (last 3 results) Recent Labs    04/30/22 1135  BNP 689.4*    ProBNP (last 3 results) No results for input(s): "PROBNP" in the last 8760 hours.   Diagnostic Studies/Procedures   No results found.  Discharge Medications   Allergies as of 05/08/2022   No Known Allergies      Medication List     STOP taking these medications    amLODipine 5 MG tablet Commonly known as: NORVASC   aspirin EC 81 MG tablet   chlorthalidone 25 MG tablet Commonly known as: HYGROTON   furosemide 20 MG tablet Commonly known as: LASIX   losartan 100 MG tablet Commonly known as: COZAAR   metFORMIN 1000 MG tablet Commonly known as: GLUCOPHAGE   pioglitazone 45 MG tablet Commonly known as: ACTOS       TAKE these medications    atorvastatin 40 MG tablet Commonly known as: LIPITOR Take 40 mg by mouth at bedtime.   cholecalciferol 25 MCG (1000 UNIT) tablet Commonly known as: VITAMIN D Take 1,000 Units by mouth in the morning and at bedtime.   dapagliflozin propanediol 10 MG Tabs tablet Commonly known as: FARXIGA Take 1 tablet (10 mg total) by mouth daily.   fish oil-omega-3 fatty acids 1000 MG capsule Take 1 g by mouth every evening.   GLUCOSAMINE CHONDR COMPLEX PO Take 2 capsules by mouth in the morning.   Januvia 100 MG tablet Generic drug: sitaGLIPtin Take 100 mg by mouth in the morning.   multivitamin with minerals tablet Take 1 tablet by mouth in the morning.  Centrum Silver   niacin 500 MG tablet Take 500 mg by  mouth in the morning.   spironolactone 25 MG tablet Commonly known as: ALDACTONE Take 0.5 tablets (12.5 mg total) by mouth daily.   tadalafil (PAH) 20 MG tablet Commonly known as: ADCIRCA Take 2 tablets (40 mg total) by mouth daily.   torsemide 20 MG tablet Commonly known as: DEMADEX Take 1 tablet (20 mg total) by mouth daily.   warfarin 5 MG tablet Commonly known as: COUMADIN Take 5-7.5 mg by mouth See admin instructions. Managed by Dr. 05/10/2022 office Take 1.5 tablets (7.5 mg) by mouth in the evenings on Sundays & Thursdays. Take 1 tablet (5 mg) by mouth in the evening on Mondays, Tuesdays, Wednesdays, Fridays & Saturdays.        Disposition   The patient will be discharged in stable condition to home. Discharge Instructions     Diet - low sodium heart healthy   Complete by: As directed    Increase activity slowly   Complete by: As directed        Follow-up Information     [facility] Follow up on 05/18/2022.   Specialty: Cardiology Why: 2:30 Located in [facility] at [facility]. Please bring all medications. Contact information: [address]  [id number] [place] [address] [phone]        Donnetta Hail, MD Follow up.   Specialty: Rheumatology Why: will contact you to make an appointment. Contact information: [address] [place] [address] [phone]         Kirstie Peri, MD. Go on 05/11/2022.   Specialty: Internal Medicine Why: @10 :30am Contact information: [address]  [place] [phone]                   Duration of Discharge Encounter: Greater than 35 minutes   Signed, [phone], NP-C  05/08/2022, 9:06 AM

## 2022-05-05 DIAGNOSIS — I50813 Acute on chronic right heart failure: Secondary | ICD-10-CM | POA: Diagnosis not present

## 2022-05-05 DIAGNOSIS — I272 Pulmonary hypertension, unspecified: Secondary | ICD-10-CM | POA: Diagnosis not present

## 2022-05-05 LAB — BASIC METABOLIC PANEL
Anion gap: 7 (ref 5–15)
BUN: 34 mg/dL — ABNORMAL HIGH (ref 8–23)
CO2: 31 mmol/L (ref 22–32)
Calcium: 8.6 mg/dL — ABNORMAL LOW (ref 8.9–10.3)
Chloride: 98 mmol/L (ref 98–111)
Creatinine, Ser: 1.57 mg/dL — ABNORMAL HIGH (ref 0.61–1.24)
GFR, Estimated: 44 mL/min — ABNORMAL LOW (ref >60)
Glucose, Bld: 108 mg/dL — ABNORMAL HIGH (ref 70–99)
Potassium: 4 mmol/L (ref 3.5–5.1)
Sodium: 136 mmol/L (ref 135–145)

## 2022-05-05 LAB — CBC
HCT: 24.7 % — ABNORMAL LOW (ref 39.0–52.0)
Hemoglobin: 8 g/dL — ABNORMAL LOW (ref 13.0–17.0)
MCH: 30.2 pg (ref 26.0–34.0)
MCHC: 32.4 g/dL (ref 30.0–36.0)
MCV: 93.2 fL (ref 80.0–100.0)
Platelets: 158 10*3/uL (ref 150–400)
RBC: 2.65 MIL/uL — ABNORMAL LOW (ref 4.22–5.81)
RDW: 16.1 % — ABNORMAL HIGH (ref 11.5–15.5)
WBC: 8.5 10*3/uL (ref 4.0–10.5)
nRBC: 0 % (ref 0.0–0.2)

## 2022-05-05 LAB — GLUCOSE, CAPILLARY
Glucose-Capillary: 123 mg/dL — ABNORMAL HIGH (ref 70–99)
Glucose-Capillary: 134 mg/dL — ABNORMAL HIGH (ref 70–99)
Glucose-Capillary: 150 mg/dL — ABNORMAL HIGH (ref 70–99)
Glucose-Capillary: 177 mg/dL — ABNORMAL HIGH (ref 70–99)

## 2022-05-05 LAB — PROTIME-INR
INR: 1.7 — ABNORMAL HIGH (ref 0.8–1.2)
Prothrombin Time: 19.9 seconds — ABNORMAL HIGH (ref 11.4–15.2)

## 2022-05-05 MED ORDER — WARFARIN SODIUM 10 MG PO TABS: 10.0000 mg | ORAL_TABLET | Freq: Once | ORAL | Status: DC

## 2022-05-05 MED ORDER — WARFARIN SODIUM 7.5 MG PO TABS
7.5000 mg | ORAL_TABLET | Freq: Once | ORAL | Status: AC
  Administered 2022-05-05: 7.5 mg via ORAL
  Filled 2022-05-05: qty 1

## 2022-05-05 NOTE — Progress Notes (Signed)
ANTICOAGULATION CONSULT NOTE  Pharmacy Consult for warfarin Indication: atrial fibrillation  No Known Allergies  Patient Measurements: Height: 5\' 7"  (170.2 cm) Weight: 84.1 kg (185 lb 6.4 oz) IBW/kg (Calculated) : 66.1 Heparin Dosing Weight: 92kg  Vital Signs: Temp: 98.7 F (37.1 C) (07/15 0502) Temp Source: Oral (07/15 0502) BP: 107/42 (07/15 0502) Pulse Rate: 51 (07/14 2335)  Labs: Recent Labs    05/02/22 0805 05/02/22 1442 05/03/22 0258 05/03/22 1429 05/03/22 2118 05/04/22 0414 05/05/22 0310  HGB 8.7*   < > 8.0*   < > 8.3* 8.4* 8.0*  HCT 26.8*   < > 24.3*   < > 25.6* 25.6* 24.7*  PLT 149*   < > 121*   < > 144* 153 158  LABPROT  --   --  20.5*  --   --  19.1* 19.9*  INR  --   --  1.8*  --   --  1.6* 1.7*  HEPARINUNFRC 0.49  --   --   --   --   --   --   CREATININE  --   --  1.45*  --   --  1.63* 1.57*   < > = values in this interval not displayed.     Estimated Creatinine Clearance: 38.9 mL/min (A) (by C-G formula based on SCr of 1.57 mg/dL (H)).   Medical History: Past Medical History:  Diagnosis Date   Atrial fibrillation (HCC)    Hyperlipidemia    Hypertension    Type 2 diabetes mellitus (HCC)      Assessment: 18 yoM with hx AFib on warfarin PTA admitted for elective R/LHC. Pharmacy asked to start IV heparin after sheath removal and resume warfarin (last warfarin dose PTA 7/6, holding pre-cath),  Heparin held 7/12 with groin hematoma.  Bleeding has resolved, pharmacy asked to resume warfarin without heparin bridge. INR today is subtherapeutic at 1.7, CBC remains stable.  Home warfarin dose 5mg  daily except 7.5mg  Sun/Thurs  Goal of Therapy:  INR 2-3 Monitor platelets by anticoagulation protocol: Yes   Plan:  -Warfarin 7.5mg  PO x1 tonight Then resume PTA dosing  -Daily PT/INR   9/12 Pharm.D. CPP, BCPS Clinical Pharmacist [phone] 05/05/2022 7:50 AM   Please check AMION for all [facility] Pharmacy numbers 05/05/2022

## 2022-05-05 NOTE — Progress Notes (Addendum)
Progress Note  Patient Name: Robert Baldwin Date of Encounter: 05/05/2022  [facility] Cardiologist: [name], MD   Subjective   No complaints  Inpatient Medications    Scheduled Meds:  atorvastatin  40 mg Oral QHS   cholecalciferol  1,000 Units Oral BID   dapagliflozin propanediol  10 mg Oral Daily   insulin aspart  0-15 Units Subcutaneous TID WC   insulin aspart  0-5 Units Subcutaneous QHS   linagliptin  5 mg Oral Daily   sodium chloride flush  3 mL Intravenous Q12H   sodium chloride flush  3 mL Intravenous Q12H   spironolactone  12.5 mg Oral Daily   tadalafil  40 mg Oral Daily   torsemide  40 mg Oral Daily   warfarin  7.5 mg Oral ONCE-1600   Warfarin - Pharmacist Dosing Inpatient   Does not apply q1600   Continuous Infusions:  sodium chloride     sodium chloride     PRN Meds: sodium chloride, sodium chloride, acetaminophen, ondansetron (ZOFRAN) IV, mouth rinse, sodium chloride flush, sodium chloride flush   Vital Signs    Vitals:   05/04/22 2335 05/05/22 0502 05/05/22 0505 05/05/22 0755  BP: (!) 108/42 (!) 107/42  (!) 104/40  Pulse: (!) 51   (!) 49  Resp:  18  18  Temp: 99.1 F (37.3 C) 98.7 F (37.1 C)  98 F (36.7 C)  TempSrc: Oral Oral  Oral  SpO2: 90% 90%  93%  Weight:   84.1 kg   Height:        Intake/Output Summary (Last 24 hours) at 05/05/2022 0931 Last data filed at 05/05/2022 0847 Gross per 24 hour  Intake 838 ml  Output 3150 ml  Net -2312 ml      05/05/2022    5:05 AM 05/04/2022    4:45 AM 05/03/2022    6:41 AM  Last 3 Weights  Weight (lbs) 185 lb 6.4 oz 187 lb 6.4 oz 188 lb 4.4 oz  Weight (kg) 84.097 kg 85.004 kg 85.4 kg      Telemetry    Rate controlled afib - Personally Reviewed  ECG    N/a - Personally Reviewed  Physical Exam   GEN: No acute distress.   Neck: No JVD Cardiac: irreg Respiratory: Clear to auscultation bilaterally. GI: Soft, nontender, non-distended  MS: No edema; No deformity. Neuro:  Nonfocal   Psych: Normal affect   Labs    High Sensitivity Troponin:  No results for input(s): "TROPONINIHS" in the last 720 hours.   Chemistry Recent Labs  Lab 04/30/22 1130 05/01/22 0354 05/02/22 0037 05/03/22 0258 05/04/22 0414 05/05/22 0310  NA 140 139   < > 139 140 136  K 4.8 4.1   < > 3.9 4.4 4.0  CL 105 108   < > 100 99 98  CO2 25 26   < > 31 29 31   GLUCOSE 109* 109*   < > 119* 117* 108*  BUN 25* 24*   < > 30* 35* 34*  CREATININE 1.13 1.09   < > 1.45* 1.63* 1.57*  CALCIUM 9.1 8.7*   < > 8.9 8.7* 8.6*  MG 1.8 1.9  --   --   --   --   PROT 6.6  --   --   --   --   --   ALBUMIN 3.4*  --   --   --   --   --   AST 35  --   --   --   --   --  ALT 17  --   --   --   --   --   ALKPHOS 76  --   --   --   --   --   BILITOT 1.5*  --   --   --   --   --   GFRNONAA >60 >60   < > 49* 42* 44*  ANIONGAP 10 5   < > 8 12 7    < > = values in this interval not displayed.    Lipids No results for input(s): "CHOL", "TRIG", "HDL", "LABVLDL", "LDLCALC", "CHOLHDL" in the last 168 hours.  Hematology Recent Labs  Lab 05/03/22 2118 05/04/22 0414 05/05/22 0310  WBC 9.7 8.9 8.5  RBC 2.75* 2.73* 2.65*  HGB 8.3* 8.4* 8.0*  HCT 25.6* 25.6* 24.7*  MCV 93.1 93.8 93.2  MCH 30.2 30.8 30.2  MCHC 32.4 32.8 32.4  RDW 16.2* 16.2* 16.1*  PLT 144* 153 158   Thyroid No results for input(s): "TSH", "FREET4" in the last 168 hours.  BNP Recent Labs  Lab 04/30/22 1135  BNP 689.4*    DDimer No results for input(s): "DDIMER" in the last 168 hours.   Radiology    CT Chest High Resolution  Result Date: 05/04/2022 CLINICAL DATA:  81 year old male with history of pulmonary arterial hypertension. EXAM: CT CHEST WITHOUT CONTRAST TECHNIQUE: Multidetector CT imaging of the chest was performed following the standard protocol without intravenous contrast. High resolution imaging of the lungs, as well as inspiratory and expiratory imaging, was performed. RADIATION DOSE REDUCTION: This exam was performed according  to the departmental dose-optimization program which includes automated exposure control, adjustment of the mA and/or kV according to patient size and/or use of iterative reconstruction technique. COMPARISON:  No priors. FINDINGS: Cardiovascular: Heart size is mildly enlarged. Small amount of pericardial fluid and/or thickening, unlikely to be of hemodynamic significance at this time. No pericardial calcification. There is aortic atherosclerosis, as well as atherosclerosis of the great vessels of the mediastinum and the coronary arteries, including calcified atherosclerotic plaque in the left anterior descending, left circumflex and right coronary arteries. Calcifications of the mitral annulus. Dilatation of the pulmonic trunk (4.4 cm in diameter). Mediastinum/Nodes: No pathologically enlarged mediastinal or hilar lymph nodes. Please note that accurate exclusion of hilar adenopathy is limited on noncontrast CT scans. Esophagus is unremarkable in appearance. No axillary lymphadenopathy. Lungs/Pleura: High-resolution images demonstrate a background of very mild diffuse ground-glass attenuation scattered throughout the lungs bilaterally. Minimal interlobular septal thickening is noted, predominantly in the mid to lower lungs. Minimal subpleural reticulation in the extreme lung bases. No traction bronchiectasis or honeycombing. Inspiratory and expiratory imaging demonstrates mild air trapping indicative of small airways disease. No acute consolidative airspace disease. No pleural effusions. No suspicious appearing pulmonary nodules or masses are noted. Upper Abdomen: Atherosclerotic calcifications in the abdominal aorta. Calcified gallstones in the gallbladder incompletely imaged. 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 Peripherally calcified lesion in the inferior aspect of the spleen, nonspecific, but benign in appearance. Musculoskeletal: There are no aggressive appearing lytic or blastic lesions noted in  the visualized portions of the skeleton. IMPRESSION: 1. Overall, the appearance of the chest is most indicative of congestive heart failure, as above. 2. The possibility of interstitial lung disease is not excluded, but not strongly favored at this time. If there is persistent clinical concern for interstitial lung disease, repeat high-resolution chest CT could be considered in 12 months to assess for temporal changes in the appearance of the lung  parenchyma. 3. Dilatation of the pulmonic trunk (4.4 cm in diameter), compatible with reported clinical history of pulmonary arterial hypertension. 4. Small amount of pericardial fluid and/or thickening, unlikely to be of hemodynamic significance at this time. 5. Aortic atherosclerosis, in addition to three-vessel coronary artery disease. 6. There are calcifications of the mitral annulus. Echocardiographic correlation for evaluation of potential valvular dysfunction may be warranted if clinically indicated. Aortic Atherosclerosis (ICD10-I70.0). Electronically Signed   By: Trudie Reed M.D.   On: 05/04/2022 08:31    Cardiac Studies    Patient Profile     81 y.o. male with history of permanent atrial fibrillation, b/l carotid artery stenosis, HTN, HLD, DM II.    Admitted post right heart cath for decompensated HFpEF with RV failure and pulmonary hypertension  Assessment & Plan    1.Acute on chronic diastolic HF with RV failure - Echo in 7/23 showed EF 70-75%, hyperdynamic LV function, mild LVH, interventricular septum flattened in systole and diastole consistent with RV pressure and volume overload, RV moderately enlarged and moderately reduced, RVSP 120 mmHg, moderate LAE, severe RAE, moderate pericardial effusion, severe TR, dilated IVC - 04/2022 LHC/RHC no significant CAD. Mean PA 58, LVEDP 22, CI 3.1. Don't see reported PCWP, mean RA 18.  - down 19 lbs, on oral diuretic torsemide 40mg  daily. 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 Will need rheumatology referral for further w/u - plans to start opsumit as outpatient and also outpatient sleep study  3. Rt Groin Hematoma/Anemia: post cath. Hgb 11.0>>10.0>>9.1>>8.7>>9.0>>8.0>>8.4. negative for PSA or AVF - Hgb 8 today, mnonitor overnight if <8 tomorrow would transfuse if 8 or higher would be ok for discharge. Hematoma site mild, likely just leveling off from prior bleeding. Don't see indication to hold coumadin at this time.      For questions or updates, please contact [facility] Please consult [email or web address] for contact info under        Signed, [place], MD  05/05/2022, 9:31 AM

## 2022-05-06 DIAGNOSIS — I272 Pulmonary hypertension, unspecified: Secondary | ICD-10-CM | POA: Diagnosis not present

## 2022-05-06 LAB — CBC
HCT: 24.7 % — ABNORMAL LOW (ref 39.0–52.0)
Hemoglobin: 7.9 g/dL — ABNORMAL LOW (ref 13.0–17.0)
MCH: 29.8 pg (ref 26.0–34.0)
MCHC: 32 g/dL (ref 30.0–36.0)
MCV: 93.2 fL (ref 80.0–100.0)
Platelets: 169 10*3/uL (ref 150–400)
RBC: 2.65 MIL/uL — ABNORMAL LOW (ref 4.22–5.81)
RDW: 16.2 % — ABNORMAL HIGH (ref 11.5–15.5)
WBC: 9.2 10*3/uL (ref 4.0–10.5)
nRBC: 0 % (ref 0.0–0.2)

## 2022-05-06 LAB — GLUCOSE, CAPILLARY
Glucose-Capillary: 123 mg/dL — ABNORMAL HIGH (ref 70–99)
Glucose-Capillary: 153 mg/dL — ABNORMAL HIGH (ref 70–99)
Glucose-Capillary: 153 mg/dL — ABNORMAL HIGH (ref 70–99)
Glucose-Capillary: 146 mg/dL — ABNORMAL HIGH (ref 70–99)
Glucose-Capillary: 114 mg/dL — ABNORMAL HIGH (ref 70–99)

## 2022-05-06 LAB — BASIC METABOLIC PANEL
Anion gap: 9 (ref 5–15)
BUN: 35 mg/dL — ABNORMAL HIGH (ref 8–23)
CO2: 30 mmol/L (ref 22–32)
Calcium: 8.6 mg/dL — ABNORMAL LOW (ref 8.9–10.3)
Chloride: 96 mmol/L — ABNORMAL LOW (ref 98–111)
Creatinine, Ser: 1.63 mg/dL — ABNORMAL HIGH (ref 0.61–1.24)
GFR, Estimated: 42 mL/min — ABNORMAL LOW (ref >60)
Glucose, Bld: 116 mg/dL — ABNORMAL HIGH (ref 70–99)
Potassium: 3.8 mmol/L (ref 3.5–5.1)
Sodium: 135 mmol/L (ref 135–145)

## 2022-05-06 LAB — MAGNESIUM: Magnesium: 2.2 mg/dL (ref 1.7–2.4)

## 2022-05-06 LAB — PREPARE RBC (CROSSMATCH)

## 2022-05-06 LAB — HEMOGLOBIN AND HEMATOCRIT, BLOOD
HCT: 29 % — ABNORMAL LOW (ref 39.0–52.0)
Hemoglobin: 9.6 g/dL — ABNORMAL LOW (ref 13.0–17.0)

## 2022-05-06 LAB — ABO/RH: ABO/RH(D): A POS

## 2022-05-06 LAB — PROTIME-INR
INR: 1.8 — ABNORMAL HIGH (ref 0.8–1.2)
Prothrombin Time: 20.8 seconds — ABNORMAL HIGH (ref 11.4–15.2)

## 2022-05-06 MED ORDER — TORSEMIDE 20 MG PO TABS
20.0000 mg | ORAL_TABLET | Freq: Every day | ORAL | Status: DC
  Administered 2022-05-06 – 2022-05-08 (×3): 20 mg via ORAL
  Filled 2022-05-06 (×3): qty 1

## 2022-05-06 MED ORDER — WARFARIN SODIUM 7.5 MG PO TABS
7.5000 mg | ORAL_TABLET | Freq: Once | ORAL | Status: AC
Start: 2022-05-06 — End: 2022-05-06
  Administered 2022-05-06: 7.5 mg via ORAL
  Filled 2022-05-06: qty 1

## 2022-05-06 MED ORDER — SODIUM CHLORIDE 0.9% IV SOLUTION: Freq: Once | INTRAVENOUS | Status: AC

## 2022-05-06 NOTE — Progress Notes (Signed)
Progress Note  Patient Name: Robert Baldwin Date of Encounter: 05/06/2022  [facility] Cardiologist: [name], MD   Subjective   No complaints  Inpatient Medications    Scheduled Meds:  atorvastatin  40 mg Oral QHS   cholecalciferol  1,000 Units Oral BID   dapagliflozin propanediol  10 mg Oral Daily   insulin aspart  0-15 Units Subcutaneous TID WC   insulin aspart  0-5 Units Subcutaneous QHS   linagliptin  5 mg Oral Daily   sodium chloride flush  3 mL Intravenous Q12H   sodium chloride flush  3 mL Intravenous Q12H   spironolactone  12.5 mg Oral Daily   tadalafil  40 mg Oral Daily   torsemide  40 mg Oral Daily   Warfarin - Pharmacist Dosing Inpatient   Does not apply q1600   Continuous Infusions:  sodium chloride     sodium chloride     PRN Meds: sodium chloride, sodium chloride, acetaminophen, ondansetron (ZOFRAN) IV, mouth rinse, sodium chloride flush, sodium chloride flush   Vital Signs    Vitals:   05/05/22 1547 05/05/22 2024 05/06/22 0007 05/06/22 0442  BP: (!) 113/40 (!) 116/36 (!) 108/46 (!) 114/43  Pulse: (!) 57 62 (!) 59 (!) 50  Resp: 19 10 15 16   Temp: 98.1 F (36.7 C) 98.9 F (37.2 C) 99.4 F (37.4 C) 97.7 F (36.5 C)  TempSrc: Oral Oral Oral Oral  SpO2: 91% 90% 90% 90%  Weight:    82.4 kg  Height:        Intake/Output Summary (Last 24 hours) at 05/06/2022 0728 Last data filed at 05/06/2022 0447 Gross per 24 hour  Intake 720 ml  Output 2150 ml  Net -1430 ml      05/06/2022    4:42 AM 05/05/2022    5:05 AM 05/04/2022    4:45 AM  Last 3 Weights  Weight (lbs) 181 lb 9.6 oz 185 lb 6.4 oz 187 lb 6.4 oz  Weight (kg) 82.373 kg 84.097 kg 85.004 kg      Telemetry    Afib rate controlled - Personally Reviewed  ECG    N/a - Personally Reviewed  Physical Exam   GEN: No acute distress.   Neck: No JVD Cardiac: irreg  Respiratory: Clear to auscultation bilaterally. GI: Soft, nontender, non-distended  MS: No edema; No  deformity. Neuro:  Nonfocal  Psych: Normal affect   Labs    High Sensitivity Troponin:  No results for input(s): "TROPONINIHS" in the last 720 hours.   Chemistry Recent Labs  Lab 04/30/22 1130 05/01/22 0354 05/02/22 0037 05/04/22 0414 05/05/22 0310 05/06/22 0416  NA 140 139   < > 140 136 135  K 4.8 4.1   < > 4.4 4.0 3.8  CL 105 108   < > 99 98 96*  CO2 25 26   < > 29 31 30   GLUCOSE 109* 109*   < > 117* 108* 116*  BUN 25* 24*   < > 35* 34* 35*  CREATININE 1.13 1.09   < > 1.63* 1.57* 1.63*  CALCIUM 9.1 8.7*   < > 8.7* 8.6* 8.6*  MG 1.8 1.9  --   --   --  2.2  PROT 6.6  --   --   --   --   --   ALBUMIN 3.4*  --   --   --   --   --   AST 35  --   --   --   --   --  ALT 17  --   --   --   --   --   ALKPHOS 76  --   --   --   --   --   BILITOT 1.5*  --   --   --   --   --   GFRNONAA >60 >60   < > 42* 44* 42*  ANIONGAP 10 5   < > 12 7 9    < > = values in this interval not displayed.    Lipids No results for input(s): "CHOL", "TRIG", "HDL", "LABVLDL", "LDLCALC", "CHOLHDL" in the last 168 hours.  Hematology Recent Labs  Lab 05/04/22 0414 05/05/22 0310 05/06/22 0416  WBC 8.9 8.5 9.2  RBC 2.73* 2.65* 2.65*  HGB 8.4* 8.0* 7.9*  HCT 25.6* 24.7* 24.7*  MCV 93.8 93.2 93.2  MCH 30.8 30.2 29.8  MCHC 32.8 32.4 32.0  RDW 16.2* 16.1* 16.2*  PLT 153 158 169   Thyroid No results for input(s): "TSH", "FREET4" in the last 168 hours.  BNP Recent Labs  Lab 04/30/22 1135  BNP 689.4*    DDimer No results for input(s): "DDIMER" in the last 168 hours.   Radiology    No results found.  Cardiac Studies     Patient Profile     80 y.o. male with history of permanent atrial fibrillation, b/l carotid artery stenosis, HTN, HLD, DM II.    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 Mean PA 58, LVEDP 22, CI 3.1. Don't see reported PCWP, mean RA 18.   - down over 20 lbs, currnelty on oral diuretic torsemide 40mg  daily. AKI Cr has peaked though not resolving. Net neg 1.4 L on oral torsemide 40 however appears euvolemic, lower torsemide to 20mg  daily.      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 Will need rheumatology referral for further w/u - plans to start opsumit as outpatient and also outpatient sleep study   3. Rt Groin Hematoma/Anemia: post cath  05/2022 negative for PSA or AVF - Hgb drop from 11 to 7.9 over several days, rate of decline has significantly lessened and nearly stabilized. Groin site stable - Hgb below 8 which HF team had set limit for transfusin, will go ahead and transfuse 1 unit today. Rate of decline has lessened significantly I think the loss from the hematoma has essentially stabilized, would continue coumadin.   4. Afib - continue coumadin For questions or updates, please contact [facility] Please consult [email or web address] for contact info under        Signed, , MD  05/06/2022, 7:28 AM

## 2022-05-06 NOTE — Progress Notes (Addendum)
ANTICOAGULATION CONSULT NOTE  Pharmacy Consult for warfarin Indication: atrial fibrillation  No Known Allergies  Patient Measurements: Height: 5\' 7"  (170.2 cm) Weight: 82.4 kg (181 lb 9.6 oz) IBW/kg (Calculated) : 66.1 Heparin Dosing Weight: 92kg  Vital Signs: Temp: 98.1 F (36.7 C) (07/16 0905) Temp Source: Oral (07/16 0905) BP: 117/45 (07/16 0914) Pulse Rate: 52 (07/16 0924)  Labs: Recent Labs    05/04/22 0414 05/05/22 0310 05/06/22 0416  HGB 8.4* 8.0* 7.9*  HCT 25.6* 24.7* 24.7*  PLT 153 158 169  LABPROT 19.1* 19.9* 20.8*  INR 1.6* 1.7* 1.8*  CREATININE 1.63* 1.57* 1.63*     Estimated Creatinine Clearance: 37.1 mL/min (A) (by C-G formula based on SCr of 1.63 mg/dL (H)).   Medical History: Past Medical History:  Diagnosis Date   Atrial fibrillation (HCC)    Hyperlipidemia    Hypertension    Type 2 diabetes mellitus (HCC)      Assessment: 52 yoM with hx AFib on warfarin PTA admitted for elective R/LHC. Pharmacy asked to start IV heparin after sheath removal and resume warfarin (last warfarin dose PTA 7/6, holding pre-cath),  Heparin held 7/12 with groin hematoma.  Bleeding has resolved, pharmacy asked to resume warfarin without heparin bridge. INR today is subtherapeutic at 1.8, CBC remains stable.  Home warfarin dose 5mg  daily except 7.5mg  Sun/Thurs  Goal of Therapy:  INR 2-3 Monitor platelets by anticoagulation protocol: Yes   Plan:  -Warfarin 7.5mg  tonight as part of home regimen  -Daily PT/INR   9/12, PharmD PGY1 Pharmacy Resident  7/16/202310:29 AM

## 2022-05-07 ENCOUNTER — Telehealth: Payer: Self-pay | Admitting: Cardiology

## 2022-05-07 DIAGNOSIS — I272 Pulmonary hypertension, unspecified: Secondary | ICD-10-CM | POA: Diagnosis not present

## 2022-05-07 LAB — CBC
HCT: 27.1 % — ABNORMAL LOW (ref 39.0–52.0)
Hemoglobin: 9 g/dL — ABNORMAL LOW (ref 13.0–17.0)
MCH: 30.5 pg (ref 26.0–34.0)
MCHC: 33.2 g/dL (ref 30.0–36.0)
MCV: 91.9 fL (ref 80.0–100.0)
Platelets: 190 10*3/uL (ref 150–400)
RBC: 2.95 MIL/uL — ABNORMAL LOW (ref 4.22–5.81)
RDW: 17.2 % — ABNORMAL HIGH (ref 11.5–15.5)
WBC: 9.2 10*3/uL (ref 4.0–10.5)
nRBC: 0 % (ref 0.0–0.2)

## 2022-05-07 LAB — TYPE AND SCREEN
ABO/RH(D): A POS
Antibody Screen: NEGATIVE
Unit division: 0

## 2022-05-07 LAB — BASIC METABOLIC PANEL
Anion gap: 9 (ref 5–15)
BUN: 36 mg/dL — ABNORMAL HIGH (ref 8–23)
CO2: 30 mmol/L (ref 22–32)
Calcium: 8.6 mg/dL — ABNORMAL LOW (ref 8.9–10.3)
Chloride: 98 mmol/L (ref 98–111)
Creatinine, Ser: 1.57 mg/dL — ABNORMAL HIGH (ref 0.61–1.24)
GFR, Estimated: 44 mL/min — ABNORMAL LOW (ref >60)
Glucose, Bld: 118 mg/dL — ABNORMAL HIGH (ref 70–99)
Potassium: 4.1 mmol/L (ref 3.5–5.1)
Sodium: 137 mmol/L (ref 135–145)

## 2022-05-07 LAB — GLUCOSE, CAPILLARY
Glucose-Capillary: 133 mg/dL — ABNORMAL HIGH (ref 70–99)
Glucose-Capillary: 129 mg/dL — ABNORMAL HIGH (ref 70–99)
Glucose-Capillary: 154 mg/dL — ABNORMAL HIGH (ref 70–99)
Glucose-Capillary: 167 mg/dL — ABNORMAL HIGH (ref 70–99)

## 2022-05-07 LAB — BPAM RBC
Blood Product Expiration Date: 202308012359
ISSUE DATE / TIME: 202307161222
Unit Type and Rh: 6200

## 2022-05-07 LAB — PROTIME-INR
INR: 2 — ABNORMAL HIGH (ref 0.8–1.2)
Prothrombin Time: 22.2 seconds — ABNORMAL HIGH (ref 11.4–15.2)

## 2022-05-07 LAB — MAGNESIUM: Magnesium: 2.2 mg/dL (ref 1.7–2.4)

## 2022-05-07 MED ORDER — POLYETHYLENE GLYCOL 3350 17 G PO PACK
17.0000 g | PACK | Freq: Two times a day (BID) | ORAL | Status: DC
Start: 2022-05-07 — End: 2022-05-08
  Administered 2022-05-07 – 2022-05-08 (×3): 17 g via ORAL
  Filled 2022-05-07 (×2): qty 1

## 2022-05-07 MED ORDER — POLYETHYLENE GLYCOL 3350 17 G PO PACK
17.0000 g | PACK | Freq: Every day | ORAL | Status: DC
  Filled 2022-05-07: qty 1

## 2022-05-07 MED ORDER — SENNOSIDES-DOCUSATE SODIUM 8.6-50 MG PO TABS
1.0000 | ORAL_TABLET | Freq: Two times a day (BID) | ORAL | Status: DC
  Administered 2022-05-07 – 2022-05-08 (×3): 1 via ORAL
  Filled 2022-05-07 (×3): qty 1

## 2022-05-07 MED ORDER — SORBITOL 70 % SOLN
60.0000 mL | Freq: Once | Status: AC
  Administered 2022-05-07: 60 mL via ORAL
  Filled 2022-05-07: qty 60

## 2022-05-07 MED ORDER — WARFARIN SODIUM 7.5 MG PO TABS
7.5000 mg | ORAL_TABLET | ORAL | Status: DC
Start: 2022-05-10 — End: 2022-05-08

## 2022-05-07 MED ORDER — WARFARIN SODIUM 5 MG PO TABS
5.0000 mg | ORAL_TABLET | ORAL | Status: DC
  Administered 2022-05-07: 5 mg via ORAL
  Filled 2022-05-07: qty 1

## 2022-05-07 MED ORDER — DOCUSATE SODIUM 100 MG PO CAPS
100.0000 mg | ORAL_CAPSULE | Freq: Every day | ORAL | Status: DC
  Filled 2022-05-07: qty 1

## 2022-05-07 NOTE — Progress Notes (Signed)
Physical Therapy Treatment Patient Details Name: Robert Baldwin MRN: [id number] DOB: 08/10/1941 Today's Date: 05/07/2022   History of Present Illness Pt is an 81 y/o M admitted for elective R/LHC and management of HFpEF with RV failure and pulmonary hypertension. PMH includes b/l carotid artery stenosis, DM II, HTN, HLD, and permanent atrial fibrillation.    PT Comments    Patient progressing well towards PT goals. Session focused on mobility progression and stair training. Requires supervision for stair training with use of Bil hand rails. Sp02 dropped to 84% on RA post stair negotiation but able to maintain Sp02 >89% on RA with ambulation, with 2/4 DOE. Able to recover Sp02 quickly with rest break and cues for pursed lip breathing. Explained importance of needing to wean 02 and increasing activity/mobility to assist in this process. Pt agreeable. Likely will not need any follow up services at d/c from a PT stand point as pt reports functioning at 88% of baseline. Continues to be a good mobility tech candidate. Will follow.    Recommendations for follow up therapy are one component of a multi-disciplinary discharge planning process, led by the attending physician.  Recommendations may be updated based on patient status, additional functional criteria and insurance authorization.  Follow Up Recommendations  No PT follow up     Assistance Recommended at Discharge Intermittent Supervision/Assistance  Patient can return home with the following A little help with walking and/or transfers;A little help with bathing/dressing/bathroom;Help with stairs or ramp for entrance;Assist for transportation   Equipment Recommendations  None recommended by PT    Recommendations for Other Services       Precautions / Restrictions Precautions Precautions: Other (comment) Precaution Comments: monitor O2 Restrictions Weight Bearing Restrictions: No     Mobility  Bed Mobility               General  bed mobility comments: Up in chair upon PT arrival.    Transfers Overall transfer level: Needs assistance Equipment used: None Transfers: Sit to/from Stand Sit to Stand: Supervision           General transfer comment: Supervision for safety. Stood from Landscape architect.    Ambulation/Gait Ambulation/Gait assistance: Supervision Gait Distance (Feet): 200 Feet (+ 100') Assistive device: None Gait Pattern/deviations: Step-through pattern, Drifts right/left Gait velocity: reduced Gait velocity interpretation: 1.31 - 2.62 ft/sec, indicative of limited community ambulator   General Gait Details: Slow, mostly steady gait reaching for rail PRN when fatigued. 2/4 DOE. Sp02 remained >89% on RA with walking but dropped to 84% post stairs, able to recover with cues for pursed lip breathing. 1 seated rest break.   Stairs Stairs: Yes Stairs assistance: Supervision Stair Management: Two rails, Step to pattern, Forwards Number of Stairs: 2 General stair comments: Cues for technique/safety. Sp02 dropped to 84% on RA, recovered wtih seated rest break and cues for breathing.   Wheelchair Mobility    Modified Rankin (Stroke Patients Only)       Balance Overall balance assessment: Needs assistance Sitting-balance support: No upper extremity supported, Feet supported Sitting balance-Leahy Scale: Good     Standing balance support: During functional activity Standing balance-Leahy Scale: Fair                              Cognition Arousal/Alertness: Awake/alert Behavior During Therapy: WFL for tasks assessed/performed Overall Cognitive Status: Within Functional Limits for tasks assessed  Exercises      General Comments General comments (skin integrity, edema, etc.): Wife present towards end of session.      Pertinent Vitals/Pain Pain Assessment Pain Assessment: No/denies pain    Home Living                           Prior Function            PT Goals (current goals can now be found in the care plan section) Acute Rehab PT Goals Patient Stated Goal: to go home without 02 PT Goal Formulation: With patient Time For Goal Achievement: 05/21/22 Potential to Achieve Goals: Good Progress towards PT goals: Progressing toward goals    Frequency    Min 3X/week      PT Plan Discharge plan needs to be updated    Co-evaluation              AM-PAC PT "6 Clicks" Mobility   Outcome Measure  Help needed turning from your back to your side while in a flat bed without using bedrails?: None Help needed moving from lying on your back to sitting on the side of a flat bed without using bedrails?: None Help needed moving to and from a bed to a chair (including a wheelchair)?: A Little Help needed standing up from a chair using your arms (e.g., wheelchair or bedside chair)?: A Little Help needed to walk in hospital room?: A Little Help needed climbing 3-5 steps with a railing? : A Little 6 Click Score: 20    End of Session Equipment Utilized During Treatment: Gait belt Activity Tolerance: Patient tolerated treatment well Patient left: in chair;with call bell/phone within reach;with family/visitor present Nurse Communication: Mobility status PT Visit Diagnosis: Unsteadiness on feet (R26.81);Other abnormalities of gait and mobility (R26.89);Muscle weakness (generalized) (M62.81)     Time: 2423-5361 PT Time Calculation (min) (ACUTE ONLY): 22 min  Charges:  $Gait Training: 8-22 mins                     [name], PT, DPT Acute Rehabilitation Services Secure chat preferred Office [phone]      [name] 05/07/2022, 11:49 AM

## 2022-05-07 NOTE — Plan of Care (Signed)
  Problem: Nutrition: Goal: Adequate nutrition will be maintained Outcome: Completed/Met   Problem: Elimination: Goal: Will not experience complications related to urinary retention Outcome: Completed/Met   Problem: Pain Managment: Goal: General experience of comfort will improve Outcome: Completed/Met   Problem: Safety: Goal: Ability to remain free from injury will improve Outcome: Completed/Met

## 2022-05-07 NOTE — Telephone Encounter (Signed)
Spouse would like to know if Dr. Diona Browner is aware that pt is currently in the hospital and does he know what all has taken place since pt has bee there. Please advise

## 2022-05-07 NOTE — Telephone Encounter (Signed)
Patient's wife made aware, verbalized understanding.

## 2022-05-07 NOTE — Telephone Encounter (Signed)
Informed wife that Dr. Diona Browner is able to see the notes from patients current hospitalization.

## 2022-05-07 NOTE — Progress Notes (Signed)
Mobility Specialist: Progress Note   05/07/22 1627  Mobility  Activity Ambulated independently in hallway  Level of Assistance Minimal assist, patient does 75% or more  Assistive Device None  Distance Ambulated (ft) 230 ft  Activity Response Tolerated well  $Mobility charge 1 Mobility   Pre-Mobility: 48 HR, 105/40 (61) BP, 94%SpO2 Post-Mobility: 63 HR, 154/54 (83) BP, 86>94% SpO2  Pt received in the bed and agreeable to mobility. MinA with bed mobility and standby during ambulation, asymptomatic throughout. Pt back to bed with call bell and phone at his side.   [facility] Mallori Araque Mobility Specialist Mobility Specialist [address]: [phone]

## 2022-05-07 NOTE — Progress Notes (Addendum)
Advanced Heart Failure Rounding Note  PCP-Cardiologist: [name], MD   Subjective:   Admitted post right heart cath for decompensated HFpEF with RV failure and severe pulmonary arterial hypertension.   V/Q scan negative for chronic PE. ANA+. All other serologic test -.   HRCT - no definite ILD with probable mild pulmonary hypertension  Has groin hematoma. [place] negative. Yesterday hgb 7.9. Got 1UPRBCs--->Hgb 9.6>9  Yesterday Torsemide cut back to 20 mg daily. Renal function trending up.  Overall diuresed 23 pounds.    Denies SOB. Denies chest pain.    RHC/LHC RA (mean): 25 mmHg RV (S/EDP): 115/25 mmHg PA (S/D, mean): 115/30 (58) mmHg PCWP (mean): Not obtained LVEDP 22 Ao sat: 97% PA sat: 70% PVR 5.6 WU (using LVEDP rather than PCWP to calculate) Fick CO: 6.4 L/min Fick CI: 3.1 L/min/m^2    Objective:   Weight Range: 84.2 kg Body mass index is 29.07 kg/m.   Vital Signs:   Temp:  [97.2 F (36.2 C)-98.4 F (36.9 C)] 98.3 F (36.8 C) (07/17 0519) Pulse Rate:  [46-55] 49 (07/17 0707) Resp:  [18-20] (P) 20 (07/17 0707) BP: (102-131)/(38-46) 119/42 (07/17 0707) SpO2:  [86 %-100 %] 95 % (07/17 0707) Weight:  [84.2 kg] 84.2 kg (07/17 0519) Last BM Date : 05/03/22  Weight change: Filed Weights   05/05/22 0505 05/06/22 0442 05/07/22 0519  Weight: 84.1 kg 82.4 kg 84.2 kg    Intake/Output:   Intake/Output Summary (Last 24 hours) at 05/07/2022 0708 Last data filed at 05/07/2022 0521 Gross per 24 hour  Intake 555 ml  Output 1650 ml  Net -1095 ml      Physical Exam    General:  No resp difficulty HEENT: normal Neck: supple. JVP 6-7 . Carotids 2+ bilat; no bruits. No lymphadenopathy or thryomegaly appreciated. Cor: PMI nondisplaced. Brady Irregular rate & rhythm. No rubs, gallops or murmurs. Lungs: clear Abdomen: soft, nontender, nondistended. No hepatosplenomegaly. No bruits or masses. Good bowel sounds. Extremities: no cyanosis, clubbing, rash,  edema. R groin hematoma. Ecchymotic.  Neuro: alert & orientedx3, cranial nerves grossly intact. moves all 4 extremities w/o difficulty. Affect pleasant   Telemetry  A fib  40-50s with occasional PVCs  EKG    N/A  Labs    CBC Recent Labs    05/06/22 0416 05/06/22 1745 05/07/22 0514  WBC 9.2  --  9.2  HGB 7.9* 9.6* 9.0*  HCT 24.7* 29.0* 27.1*  MCV 93.2  --  91.9  PLT 169  --  190   Basic Metabolic Panel Recent Labs    32/44/01 0416 05/07/22 0514  NA 135 137  K 3.8 4.1  CL 96* 98  CO2 30 30  GLUCOSE 116* 118*  BUN 35* 36*  CREATININE 1.63* 1.57*  CALCIUM 8.6* 8.6*  MG 2.2 2.2   Liver Function Tests No results for input(s): "AST", "ALT", "ALKPHOS", "BILITOT", "PROT", "ALBUMIN" in the last 72 hours.  No results for input(s): "LIPASE", "AMYLASE" in the last 72 hours. Cardiac Enzymes No results for input(s): "CKTOTAL", "CKMB", "CKMBINDEX", "TROPONINI" in the last 72 hours.  BNP: BNP (last 3 results) Recent Labs    04/30/22 1135  BNP 689.4*    ProBNP (last 3 results) No results for input(s): "PROBNP" in the last 8760 hours.   D-Dimer No results for input(s): "DDIMER" in the last 72 hours. Hemoglobin A1C No results for input(s): "HGBA1C" in the last 72 hours.  Fasting Lipid Panel No results for input(s): "CHOL", "HDL", "LDLCALC", "TRIG", "  CHOLHDL", "LDLDIRECT" in the last 72 hours. Thyroid Function Tests No results for input(s): "TSH", "T4TOTAL", "T3FREE", "THYROIDAB" in the last 72 hours.  Invalid input(s): "FREET3"  Other results:   Imaging    No results found.   Medications:     Scheduled Medications:  atorvastatin  40 mg Oral QHS   cholecalciferol  1,000 Units Oral BID   dapagliflozin propanediol  10 mg Oral Daily   insulin aspart  0-15 Units Subcutaneous TID WC   insulin aspart  0-5 Units Subcutaneous QHS   linagliptin  5 mg Oral Daily   sodium chloride flush  3 mL Intravenous Q12H   sodium chloride flush  3 mL Intravenous Q12H    spironolactone  12.5 mg Oral Daily   tadalafil  40 mg Oral Daily   torsemide  20 mg Oral Daily   Warfarin - Pharmacist Dosing Inpatient   Does not apply q1600    Infusions:  sodium chloride     sodium chloride      PRN Medications: sodium chloride, sodium chloride, acetaminophen, ondansetron (ZOFRAN) IV, mouth rinse, sodium chloride flush, sodium chloride flush    Patient Profile  81 y.o. male with history of permanent atrial fibrillation, b/l carotid artery stenosis, HTN, HLD, DM II.    Admitted post right heart cath for decompensated HFpEF with RV failure and pulmonary hypertension  Assessment/Plan   1. 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 RHC/LHC this admit showed no significant CAD, elevated right >>>left heart filling pressures with severe pulmonary arterial hypertension and preserved cardiac output. Good response to IV Lasix. Diuresed  23 lb. Now on PO diuretics. SCr 1.09>>1.33>>1.45>>1.63>1.57>1.63>1.57  - Volume status stable. Continue Torsemide 20 mg daily.  - Continue spironolactone 25 mg daily.  - Off Entresto w/ soft BP  - Continue Farxiga 10 mg daily  - Would not use Actos in future.  2. Pulmonary arterial hypertension: Severe PAH on RHC today, PVR 5.6 WU.  He has never smoked and has no known lung disease.  He has been on warfarin for atrial fibrillation long-term. No known rheumatologic illness.  Not a drinker though he has some signs of cirrhosis by prior liver imaging.  - V/Q scan negative for chronic PE  - High resolution CT chest completed, no definite ILD with probable mild pulmonary hypertension.  - Sent serologic workup>>anti-SCL  70 neg, anti-centromere Ab neg, RF neg, ANA with reflex positive. 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 Will need rheumatology referral for further w/u - Continue  Tadalafil 40 mg daily. - Will work on starting Opsumit 10 daily for combination therapy, will do as outpatient. PA already sent.  - Will need sleep study as outpatient.  3. Atrial fibrillation: Permanent.   - Continue warfarin. INR 2. Discussed with pharmacy.  - Bradycardic, does not need nodal blocker.  4. DM2 - d/c home actos - Farxiga started this admit  5. Carotid stenosis: CTA neck in 6/23 showed high grade bilateral ICA stenoses.  - Continue statin.  - He is on warfarin for AF. - Severe PAH is going to be a significant risk for general anesthesia for CEA.  If it appears appropriate to start selective pulmonary vasodilators, plan to titrate these up relatively rapidly then  repeat RHC to see if we have been able to bring down his PA pressure before surgery.  6. Rt Groin Hematoma/Anemia: post cath. Hgb 11.0>>10.0>>9.1>>8.7>>9.0>>8.0>>8.4. [place] negative for PSA or AVF - transfuse for Hgb < 8.0 - Given 1UPRBC 7/16  Hgb 7.9>9.6>9. No obvious source.  - Iron stats 9%.  -Got feraheme 7/14   Ambulate.   Length of Stay: 7  Amy Clegg, NP  05/07/2022, 7:08 AM  Advanced Heart Failure Team Pager [phone] (M-F; 7a - 5p)  Please contact [facility] for night-coverage after hours (5p -7a ) and weekends on [email or web address]  Patient seen and examined with the above-signed Advanced Practice Provider and/or Housestaff. I personally reviewed laboratory data, imaging studies and relevant notes. I independently examined the patient and formulated the important aspects of the plan. I have edited the note to reflect any of my changes or salient points. I have personally discussed the plan with the patient and/or family.  Improving slowly. No CP or SOB. R groin site and hgb stable. INR 2.0. No BM x 1 week  General:  Elderly No resp difficulty HEENT:  normal Neck: supple. no JVD. Carotids 2+ bilat; no bruits. No lymphadenopathy or thryomegaly appreciated. Cor: PMI nondisplaced. Regular rate & rhythm. 2/6 TR Lungs: clear Abdomen: soft, nontender, nondistended. No hepatosplenomegaly. No bruits or masses. Good bowel sounds. Extremities: no cyanosis, clubbing, rash, edema Neuro: alert & orientedx3, cranial nerves grossly intact. moves all 4 extremities w/o difficulty. Affect pleasant  Continues to improve. HGb and groin site stable. Tolerating sildenafil. Will continue to ambulate. Needs BM prior to d/c. Will start bowel regimen.  Arvilla Meres, MD  5:07 PM

## 2022-05-07 NOTE — Progress Notes (Signed)
ANTICOAGULATION CONSULT NOTE  Pharmacy Consult for warfarin Indication: atrial fibrillation  No Known Allergies  Patient Measurements: Height: 5\' 7"  (170.2 cm) Weight: 82.5 kg (181 lb 14.1 oz) IBW/kg (Calculated) : 66.1 Heparin Dosing Weight: 92kg  Vital Signs: Temp: 98 F (36.7 C) (07/17 1139) Temp Source: Oral (07/17 1139) BP: 126/44 (07/17 1139) Pulse Rate: 48 (07/17 1200)  Labs: Recent Labs    05/05/22 0310 05/06/22 0416 05/06/22 1745 05/07/22 0514  HGB 8.0* 7.9* 9.6* 9.0*  HCT 24.7* 24.7* 29.0* 27.1*  PLT 158 169  --  190  LABPROT 19.9* 20.8*  --  22.2*  INR 1.7* 1.8*  --  2.0*  CREATININE 1.57* 1.63*  --  1.57*     Estimated Creatinine Clearance: 38.6 mL/min (A) (by C-G formula based on SCr of 1.57 mg/dL (H)).   Medical History: Past Medical History:  Diagnosis Date   Atrial fibrillation (HCC)    Hyperlipidemia    Hypertension    Type 2 diabetes mellitus (HCC)      Assessment: 36 yoM with hx AFib on warfarin PTA admitted for elective R/LHC. Pharmacy asked to start IV heparin after sheath removal and resume warfarin (last warfarin dose PTA 7/6, holding pre-cath),  Heparin held 7/12 with groin hematoma.  Bleeding has resolved. INR today is at goal at 2, CBC remains stable.  Home warfarin dose 5mg  daily except 7.5mg  Sun/Thurs  Goal of Therapy:  INR 2-3 Monitor platelets by anticoagulation protocol: Yes   Plan:  -Warfarin 5mg  tonight as part of home regimen   Possible discharge home today Would continue 7.5mg  Sundays/Thursdays and 5mg  all other days  PharmD., BCPS Clinical Pharmacist 05/07/2022 12:54 PM

## 2022-05-08 ENCOUNTER — Other Ambulatory Visit (HOSPITAL_COMMUNITY): Payer: Self-pay

## 2022-05-08 DIAGNOSIS — I272 Pulmonary hypertension, unspecified: Secondary | ICD-10-CM | POA: Diagnosis not present

## 2022-05-08 LAB — CBC
HCT: 27.3 % — ABNORMAL LOW (ref 39.0–52.0)
Hemoglobin: 9.1 g/dL — ABNORMAL LOW (ref 13.0–17.0)
MCH: 30.7 pg (ref 26.0–34.0)
MCHC: 33.3 g/dL (ref 30.0–36.0)
MCV: 92.2 fL (ref 80.0–100.0)
Platelets: 205 10*3/uL (ref 150–400)
RBC: 2.96 MIL/uL — ABNORMAL LOW (ref 4.22–5.81)
RDW: 17.1 % — ABNORMAL HIGH (ref 11.5–15.5)
WBC: 9.4 10*3/uL (ref 4.0–10.5)
nRBC: 0.2 % (ref 0.0–0.2)

## 2022-05-08 LAB — GLUCOSE, CAPILLARY
Glucose-Capillary: 129 mg/dL — ABNORMAL HIGH (ref 70–99)
Glucose-Capillary: 142 mg/dL — ABNORMAL HIGH (ref 70–99)
Glucose-Capillary: 142 mg/dL — ABNORMAL HIGH (ref 70–99)

## 2022-05-08 LAB — BASIC METABOLIC PANEL
Anion gap: 8 (ref 5–15)
BUN: 39 mg/dL — ABNORMAL HIGH (ref 8–23)
CO2: 31 mmol/L (ref 22–32)
Calcium: 8.6 mg/dL — ABNORMAL LOW (ref 8.9–10.3)
Chloride: 97 mmol/L — ABNORMAL LOW (ref 98–111)
Creatinine, Ser: 1.53 mg/dL — ABNORMAL HIGH (ref 0.61–1.24)
GFR, Estimated: 46 mL/min — ABNORMAL LOW (ref >60)
Glucose, Bld: 116 mg/dL — ABNORMAL HIGH (ref 70–99)
Potassium: 3.8 mmol/L (ref 3.5–5.1)
Sodium: 136 mmol/L (ref 135–145)

## 2022-05-08 LAB — PROTIME-INR
INR: 2.1 — ABNORMAL HIGH (ref 0.8–1.2)
Prothrombin Time: 23 seconds — ABNORMAL HIGH (ref 11.4–15.2)

## 2022-05-08 LAB — MAGNESIUM: Magnesium: 2.3 mg/dL (ref 1.7–2.4)

## 2022-05-08 MED ORDER — DAPAGLIFLOZIN PROPANEDIOL 10 MG PO TABS
10.0000 mg | ORAL_TABLET | Freq: Every day | ORAL | 5 refills | Status: DC
  Filled 2022-05-08: qty 30, 30d supply, fill #0

## 2022-05-08 MED ORDER — TORSEMIDE 20 MG PO TABS
20.0000 mg | ORAL_TABLET | Freq: Every day | ORAL | 6 refills | Status: DC
  Filled 2022-05-08: qty 30, 30d supply, fill #0

## 2022-05-08 MED ORDER — SPIRONOLACTONE 25 MG PO TABS
12.5000 mg | ORAL_TABLET | Freq: Every day | ORAL | 5 refills | Status: DC
  Filled 2022-05-08: qty 30, 60d supply, fill #0

## 2022-05-08 NOTE — Progress Notes (Signed)
Mobility Specialist: Progress Note   05/08/22 1200  Mobility  Activity Ambulated independently in hallway  Level of Assistance Independent  Assistive Device None  Distance Ambulated (ft) 230 ft  Activity Response Tolerated well  $Mobility charge 1 Mobility   Pre-Mobility: 52 HR, 92% SpO2 Post-Mobility: 70 HR, 88>90% SpO2  Received pt in chair having no complaints and agreeable to mobility. Pt was asymptomatic throughout ambulation and returned to room w/o fault. Left in chair w/ call bell in reach and all needs met.  [facility] Khrystian Schauf Mobility Specialist Mobility Specialist [address]: [phone]

## 2022-05-08 NOTE — Care Management Important Message (Signed)
Important Message  Patient Details  Name: JACARRI GESNER MRN: [id number] Date of Birth: 1941/07/21   Medicare Important Message Given:  Yes     [place], [place] 05/08/2022, 11:40 AM

## 2022-05-08 NOTE — Progress Notes (Signed)
Pt A/O x 4. OOB w/ assistance. Admitted for pulmonary hypertension. RHC performed 04/30/22. See noted. R. Groin hematoma post-cath. MD aware. Denies pain and SOB.VSS. Labs stable. Pt educated on the use of 02. Pt stated he didn't want to wear 02 and he wasn't going to wear it if it was given. CM aware. Pt adequate to discharge at this time. Educations meds and follow up care provided.

## 2022-05-08 NOTE — Progress Notes (Signed)
OT Cancellation Note  Patient Details Name: Robert Baldwin MRN: [id number] DOB: [birth year]   Cancelled Treatment:    Reason Eval/Treat Not Completed: Patient declined, no reason specified (Pt reported if  you come back later but no specific reason at this time to decline. Pt was administered energy conservation handout.) Alphia Moh OTR/L  Acute Rehab Services  [phone] office number [phone] pager number   Alphia Moh 05/08/2022, 7:51 AM

## 2022-05-08 NOTE — H&P (View-Only) (Signed)
Advanced Heart Failure Rounding Note  PCP-Cardiologist: [name], MD   Subjective:   Admitted post right heart cath for decompensated HFpEF with RV failure and severe pulmonary arterial hypertension.   V/Q scan negative for chronic PE. ANA+. All other serologic test -.   HRCT - no definite ILD with probable mild pulmonary hypertension  Has groin hematoma. [place] negative. Yesterday hgb 7.9. Got 1UPRBCs--->Hgb 9.6>9>9.1 today    Denies SOB. Had BM yesterday. Wants to go home   Objective:   Weight Range: 81.6 kg Body mass index is 28.19 kg/m.   Vital Signs:   Temp:  [98 F (36.7 C)-98.6 F (37 C)] 98 F (36.7 C) (07/18 0557) Pulse Rate:  [47-89] 89 (07/18 0557) Resp:  [18-20] 18 (07/18 0557) BP: (107-126)/(32-44) 117/42 (07/18 0557) SpO2:  [89 %-96 %] 90 % (07/18 0557) Weight:  [81.6 kg] 81.6 kg (07/18 0557) Last BM Date : 05/07/22  Weight change: Filed Weights   05/07/22 0519 05/07/22 0724 05/08/22 0557  Weight: 84.2 kg 82.5 kg 81.6 kg    Intake/Output:   Intake/Output Summary (Last 24 hours) at 05/08/2022 0817 Last data filed at 05/08/2022 0500 Gross per 24 hour  Intake 360 ml  Output 900 ml  Net -540 ml      Physical Exam    General:  Well appearing. No resp difficulty HEENT: normal Neck: supple.  JVP 5-6 . Carotids 2+ bilat; no bruits. No lymphadenopathy or thryomegaly appreciated. Cor: PMI nondisplaced. Irregular rate & rhythm. No rubs, gallops or murmurs. Lungs: clear Abdomen: soft, nontender, nondistended. No hepatosplenomegaly. No bruits or masses. Good bowel sounds. Extremities: no cyanosis, clubbing, rash, edema Neuro: alert & orientedx3, cranial nerves grossly intact. moves all 4 extremities w/o difficulty. Affect pleasant   Telemetry   Afiv 40-50s with occasional PVCs.   EKG    N/A  Labs    CBC Recent Labs    05/07/22 0514 05/08/22 0414  WBC 9.2 9.4  HGB 9.0* 9.1*  HCT 27.1* 27.3*  MCV 91.9 92.2  PLT 190 205   Basic  Metabolic Panel Recent Labs    30/09/23 0514 05/08/22 0414  NA 137 136  K 4.1 3.8  CL 98 97*  CO2 30 31  GLUCOSE 118* 116*  BUN 36* 39*  CREATININE 1.57* 1.53*  CALCIUM 8.6* 8.6*  MG 2.2 2.3   Liver Function Tests No results for input(s): "AST", "ALT", "ALKPHOS", "BILITOT", "PROT", "ALBUMIN" in the last 72 hours.  No results for input(s): "LIPASE", "AMYLASE" in the last 72 hours. Cardiac Enzymes No results for input(s): "CKTOTAL", "CKMB", "CKMBINDEX", "TROPONINI" in the last 72 hours.  BNP: BNP (last 3 results) Recent Labs    04/30/22 1135  BNP 689.4*    ProBNP (last 3 results) No results for input(s): "PROBNP" in the last 8760 hours.   D-Dimer No results for input(s): "DDIMER" in the last 72 hours. Hemoglobin A1C No results for input(s): "HGBA1C" in the last 72 hours.  Fasting Lipid Panel No results for input(s): "CHOL", "HDL", "LDLCALC", "TRIG", "CHOLHDL", "LDLDIRECT" in the last 72 hours. Thyroid Function Tests No results for input(s): "TSH", "T4TOTAL", "T3FREE", "THYROIDAB" in the last 72 hours.  Invalid input(s): "FREET3"  Other results:   Imaging    No results found.   Medications:     Scheduled Medications:  atorvastatin  40 mg Oral QHS   cholecalciferol  1,000 Units Oral BID   dapagliflozin propanediol  10 mg Oral Daily   insulin aspart  0-15 Units  Subcutaneous TID WC   insulin aspart  0-5 Units Subcutaneous QHS   linagliptin  5 mg Oral Daily   polyethylene glycol  17 g Oral BID   senna-docusate  1 tablet Oral BID   sodium chloride flush  3 mL Intravenous Q12H   sodium chloride flush  3 mL Intravenous Q12H   spironolactone  12.5 mg Oral Daily   tadalafil  40 mg Oral Daily   torsemide  20 mg Oral Daily   warfarin  5 mg Oral Once per day on Mon Tue Wed Fri Sat   [START ON 05/10/2022] warfarin  7.5 mg Oral Once per day on Sun Thu   Warfarin - Pharmacist Dosing Inpatient   Does not apply q1600    Infusions:  sodium chloride     sodium  chloride      PRN Medications: sodium chloride, sodium chloride, acetaminophen, ondansetron (ZOFRAN) IV, mouth rinse, sodium chloride flush, sodium chloride flush    Patient Profile  81 y.o. male with history of permanent atrial fibrillation, b/l carotid artery stenosis, HTN, HLD, DM II.    Admitted post right heart cath for decompensated HFpEF with RV failure and pulmonary hypertension  Assessment/Plan   1. 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 RHC/LHC this admit showed no significant CAD, elevated right >>>left heart filling pressures with severe pulmonary arterial hypertension and preserved cardiac output. Good response to IV Lasix. Diuresed  24  lb. Stable on po torsemide.  - Renal funciton ok.  - Volume status stable. Continue Torsemide 20 mg daily.  - Continue spironolactone 25 mg daily.  - Off Entresto w/ soft BP  - Continue Farxiga 10 mg daily  - Would not use Actos in future.  2. Pulmonary arterial hypertension: Severe PAH on RHC today, PVR 5.6 WU.  He has never smoked and has no known lung disease.  He has been on warfarin for atrial fibrillation long-term. No known rheumatologic illness.  Not a drinker though he has some signs of cirrhosis by prior liver imaging.  - V/Q scan negative for chronic PE  - High resolution CT chest completed, no definite ILD with probable mild pulmonary hypertension.  - Sent serologic workup>>anti-SCL 70 neg, anti-centromere Ab neg, RF neg, ANA with reflex positive. 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 Will need rheumatology referral for further w/u - Continue  Tadalafil 40 mg daily. - Will work on starting Opsumit 10 daily for  combination therapy, will do as outpatient. PA already sent.  - Will need sleep study as outpatient.  3. Atrial fibrillation: Permanent.   - Continue warfarin. INR 2.1 Discussed with pharmacy.  - Bradycardic, does not need nodal blocker.  4. DM2 - d/c home actos - Farxiga started this admit  5. Carotid stenosis: CTA neck in 6/23 showed high grade bilateral ICA stenoses.  - Continue statin.  - He is on warfarin for AF. - Severe PAH is going to be a significant risk for general anesthesia for CEA.  If it appears appropriate to start selective pulmonary vasodilators, plan to titrate these up relatively rapidly then repeat RHC to see if we have been able to bring down his PA pressure before surgery.  6. Rt Groin Hematoma/Anemia: post cath. [place] negative  for PSA or AVF - transfuse for Hgb < 8.0 - Given 1UPRBC 7/16  Hgb 7.9>9.6>9>9.1 No obvious source.  - Iron stats 9%.  -Got feraheme 7/14   Home today.     Length of Stay: [place], NP  05/08/2022, 8:17 AM  Advanced Heart Failure Team Pager [phone] (M-F; 7a - 5p)  Please contact [facility] for night-coverage after hours (5p -7a ) and weekends on [email or web address]  Patient seen and examined with the above-signed Advanced Practice Provider and/or Housestaff. I personally reviewed laboratory data, imaging studies and relevant notes. I independently examined the patient and formulated the important aspects of the plan. I have edited the note to reflect any of my changes or salient points. I have personally discussed the plan with the patient and/or family.  Much improved. Hgb stable. Groin site ok. Breathing ok. No CP. Remains in NSR. Good BM overnight  General:  Elderly. No resp difficulty HEENT: normal Neck: supple. JVP 8-9 Carotids 2+ bilat; no bruits. No lymphadenopathy or thryomegaly appreciated. Cor: PMI nondisplaced. Regular rate & rhythm. 2/6 TR Lungs: clear Abdomen: soft, nontender, nondistended. No hepatosplenomegaly. No bruits  or masses. Good bowel sounds. Extremities: no cyanosis, clubbing, rash, edema R groin ecchymosis Neuro: alert & orientedx3, cranial nerves grossly intact. moves all 4 extremities w/o difficulty. Affect pleasant  Ok for d/c today. Continue sildenafil. Start macitentan as outpatient.   Glori Bickers, MD  10:46 AM

## 2022-05-08 NOTE — TOC CM/SW Note (Signed)
SATURATION QUALIFICATIONS: (This note is used to comply with regulatory documentation for home oxygen)  Patient Saturations on Room Air at Rest = 87%  Patient Saturations on Room Air while Ambulating = 86%  Patient Saturations on 2 Liters of oxygen while Ambulating = 93-96%  Please briefly explain why patient needs home oxygen:HF, pulmonary hypertension.

## 2022-05-08 NOTE — Progress Notes (Addendum)
Advanced Heart Failure Rounding Note  PCP-Cardiologist: [name], MD   Subjective:   Admitted post right heart cath for decompensated HFpEF with RV failure and severe pulmonary arterial hypertension.   V/Q scan negative for chronic PE. ANA+. All other serologic test -.   HRCT - no definite ILD with probable mild pulmonary hypertension  Has groin hematoma. [place] negative. Yesterday hgb 7.9. Got 1UPRBCs--->Hgb 9.6>9>9.1 today    Denies SOB. Had BM yesterday. Wants to go home   Objective:   Weight Range: 81.6 kg Body mass index is 28.19 kg/m.   Vital Signs:   Temp:  [98 F (36.7 C)-98.6 F (37 C)] 98 F (36.7 C) (07/18 0557) Pulse Rate:  [47-89] 89 (07/18 0557) Resp:  [18-20] 18 (07/18 0557) BP: (107-126)/(32-44) 117/42 (07/18 0557) SpO2:  [89 %-96 %] 90 % (07/18 0557) Weight:  [81.6 kg] 81.6 kg (07/18 0557) Last BM Date : 05/07/22  Weight change: Filed Weights   05/07/22 0519 05/07/22 0724 05/08/22 0557  Weight: 84.2 kg 82.5 kg 81.6 kg    Intake/Output:   Intake/Output Summary (Last 24 hours) at 05/08/2022 0817 Last data filed at 05/08/2022 0500 Gross per 24 hour  Intake 360 ml  Output 900 ml  Net -540 ml      Physical Exam    General:  Well appearing. No resp difficulty HEENT: normal Neck: supple.  JVP 5-6 . Carotids 2+ bilat; no bruits. No lymphadenopathy or thryomegaly appreciated. Cor: PMI nondisplaced. Irregular rate & rhythm. No rubs, gallops or murmurs. Lungs: clear Abdomen: soft, nontender, nondistended. No hepatosplenomegaly. No bruits or masses. Good bowel sounds. Extremities: no cyanosis, clubbing, rash, edema Neuro: alert & orientedx3, cranial nerves grossly intact. moves all 4 extremities w/o difficulty. Affect pleasant   Telemetry   Afiv 40-50s with occasional PVCs.   EKG    N/A  Labs    CBC Recent Labs    05/07/22 0514 05/08/22 0414  WBC 9.2 9.4  HGB 9.0* 9.1*  HCT 27.1* 27.3*  MCV 91.9 92.2  PLT 190 205   Basic  Metabolic Panel Recent Labs    30/09/23 0514 05/08/22 0414  NA 137 136  K 4.1 3.8  CL 98 97*  CO2 30 31  GLUCOSE 118* 116*  BUN 36* 39*  CREATININE 1.57* 1.53*  CALCIUM 8.6* 8.6*  MG 2.2 2.3   Liver Function Tests No results for input(s): "AST", "ALT", "ALKPHOS", "BILITOT", "PROT", "ALBUMIN" in the last 72 hours.  No results for input(s): "LIPASE", "AMYLASE" in the last 72 hours. Cardiac Enzymes No results for input(s): "CKTOTAL", "CKMB", "CKMBINDEX", "TROPONINI" in the last 72 hours.  BNP: BNP (last 3 results) Recent Labs    04/30/22 1135  BNP 689.4*    ProBNP (last 3 results) No results for input(s): "PROBNP" in the last 8760 hours.   D-Dimer No results for input(s): "DDIMER" in the last 72 hours. Hemoglobin A1C No results for input(s): "HGBA1C" in the last 72 hours.  Fasting Lipid Panel No results for input(s): "CHOL", "HDL", "LDLCALC", "TRIG", "CHOLHDL", "LDLDIRECT" in the last 72 hours. Thyroid Function Tests No results for input(s): "TSH", "T4TOTAL", "T3FREE", "THYROIDAB" in the last 72 hours.  Invalid input(s): "FREET3"  Other results:   Imaging    No results found.   Medications:     Scheduled Medications:  atorvastatin  40 mg Oral QHS   cholecalciferol  1,000 Units Oral BID   dapagliflozin propanediol  10 mg Oral Daily   insulin aspart  0-15 Units  Subcutaneous TID WC   insulin aspart  0-5 Units Subcutaneous QHS   linagliptin  5 mg Oral Daily   polyethylene glycol  17 g Oral BID   senna-docusate  1 tablet Oral BID   sodium chloride flush  3 mL Intravenous Q12H   sodium chloride flush  3 mL Intravenous Q12H   spironolactone  12.5 mg Oral Daily   tadalafil  40 mg Oral Daily   torsemide  20 mg Oral Daily   warfarin  5 mg Oral Once per day on Mon Tue Wed Fri Sat   [START ON 05/10/2022] warfarin  7.5 mg Oral Once per day on Sun Thu   Warfarin - Pharmacist Dosing Inpatient   Does not apply q1600    Infusions:  sodium chloride     sodium  chloride      PRN Medications: sodium chloride, sodium chloride, acetaminophen, ondansetron (ZOFRAN) IV, mouth rinse, sodium chloride flush, sodium chloride flush    Patient Profile  81 y.o. male with history of permanent atrial fibrillation, b/l carotid artery stenosis, HTN, HLD, DM II.    Admitted post right heart cath for decompensated HFpEF with RV failure and pulmonary hypertension  Assessment/Plan   1. 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 RHC/LHC this admit showed no significant CAD, elevated right >>>left heart filling pressures with severe pulmonary arterial hypertension and preserved cardiac output. Good response to IV Lasix. Diuresed  24  lb. Stable on po torsemide.  - Renal funciton ok.  - Volume status stable. Continue Torsemide 20 mg daily.  - Continue spironolactone 25 mg daily.  - Off Entresto w/ soft BP  - Continue Farxiga 10 mg daily  - Would not use Actos in future.  2. Pulmonary arterial hypertension: Severe PAH on RHC today, PVR 5.6 WU.  He has never smoked and has no known lung disease.  He has been on warfarin for atrial fibrillation long-term. No known rheumatologic illness.  Not a drinker though he has some signs of cirrhosis by prior liver imaging.  - V/Q scan negative for chronic PE  - High resolution CT chest completed, no definite ILD with probable mild pulmonary hypertension.  - Sent serologic workup>>anti-SCL 70 neg, anti-centromere Ab neg, RF neg, ANA with reflex positive. 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 Will need rheumatology referral for further w/u - Continue  Tadalafil 40 mg daily. - Will work on starting Opsumit 10 daily for  combination therapy, will do as outpatient. PA already sent.  - Will need sleep study as outpatient.  3. Atrial fibrillation: Permanent.   - Continue warfarin. INR 2.1 Discussed with pharmacy.  - Bradycardic, does not need nodal blocker.  4. DM2 - d/c home actos - Farxiga started this admit  5. Carotid stenosis: CTA neck in 6/23 showed high grade bilateral ICA stenoses.  - Continue statin.  - He is on warfarin for AF. - Severe PAH is going to be a significant risk for general anesthesia for CEA.  If it appears appropriate to start selective pulmonary vasodilators, plan to titrate these up relatively rapidly then repeat RHC to see if we have been able to bring down his PA pressure before surgery.  6. Rt Groin Hematoma/Anemia: post cath. [place] negative  for PSA or AVF - transfuse for Hgb < 8.0 - Given 1UPRBC 7/16  Hgb 7.9>9.6>9>9.1 No obvious source.  - Iron stats 9%.  -Got feraheme 7/14   Home today.     Length of Stay: [place], NP  05/08/2022, 8:17 AM  Advanced Heart Failure Team Pager [phone] (M-F; 7a - 5p)  Please contact [facility] for night-coverage after hours (5p -7a ) and weekends on [email or web address]  Patient seen and examined with the above-signed Advanced Practice Provider and/or Housestaff. I personally reviewed laboratory data, imaging studies and relevant notes. I independently examined the patient and formulated the important aspects of the plan. I have edited the note to reflect any of my changes or salient points. I have personally discussed the plan with the patient and/or family.  Much improved. Hgb stable. Groin site ok. Breathing ok. No CP. Remains in NSR. Good BM overnight  General:  Elderly. No resp difficulty HEENT: normal Neck: supple. JVP 8-9 Carotids 2+ bilat; no bruits. No lymphadenopathy or thryomegaly appreciated. Cor: PMI nondisplaced. Regular rate & rhythm. 2/6 TR Lungs: clear Abdomen: soft, nontender, nondistended. No hepatosplenomegaly. No bruits  or masses. Good bowel sounds. Extremities: no cyanosis, clubbing, rash, edema R groin ecchymosis Neuro: alert & orientedx3, cranial nerves grossly intact. moves all 4 extremities w/o difficulty. Affect pleasant  Ok for d/c today. Continue sildenafil. Start macitentan as outpatient.   Glori Bickers, MD  10:46 AM

## 2022-05-08 NOTE — TOC Initial Note (Signed)
Transition of Care [facility]) - Initial/Assessment Note    Patient Details  Name: Robert Baldwin MRN: [id number] Date of Birth: 02-12-1941  Transition of [facility]) CM/SW Contact:    Elliot Cousin, RN Phone Number: [phone] 05/08/2022, 3:05 PM  Clinical Narrative:                  HF TOC CM spoke to pt at bedside. Pt declined HH at this time. States he does not need any DME. Discuss home oxygen and pt agreeable to home oxygen. Contacted [employer] [facility] for oxygen for home. Will deliver portable tank to room, and deliver oxygen concentrator to home.     Expected Discharge Plan: Home w Home Health Services Barriers to Discharge: No Barriers Identified   Patient Goals and CMS Choice Patient states their goals for this hospitalization and ongoing recovery are:: wants to get back to feeling good CMS Medicare.gov Compare Post Acute Care list provided to:: Patient Choice offered to / list presented to : Patient  Expected Discharge Plan and Services Expected Discharge Plan: Home w Home Health Services   Discharge Planning Services: CM Consult Post Acute Care Choice: Home Health Living arrangements for the past 2 months: Single Family Home Expected Discharge Date: 05/08/22               DME Arranged: Oxygen DME Agency: [employer] Date DME Agency Contacted: 05/08/22 Time DME Agency Contacted: 1100 Representative spoke with at DME Agency: [employer] [facility] HH Arranged: Refused HH          Prior Living Arrangements/Services Living arrangements for the past 2 months: Single Family Home Lives with:: Spouse Patient language and need for interpreter reviewed:: Yes Do you feel safe going back to the place where you live?: Yes      Need for Family Participation in Patient Care: No (Comment) Care giver support system in place?: No (comment) Current home services: DME (cane) Criminal Activity/Legal Involvement Pertinent to Current Situation/Hospitalization: No - Comment as  needed  Activities of Daily Living Home Assistive Devices/Equipment: None ADL Screening (condition at time of admission) Patient's cognitive ability adequate to safely complete daily activities?: Yes Is the patient deaf or have difficulty hearing?: Yes Does the patient have difficulty seeing, even when wearing glasses/contacts?: No Does the patient have difficulty concentrating, remembering, or making decisions?: No Patient able to express need for assistance with ADLs?: Yes Does the patient have difficulty dressing or bathing?: Yes Independently performs ADLs?: Yes (appropriate for developmental age) Does the patient have difficulty walking or climbing stairs?: Yes Weakness of Legs: Both Weakness of Arms/Hands: None  Permission Sought/Granted Permission sought to share information with : Case Manager, Family Supports, PCP Permission granted to share information with : Yes, Verbal Permission Granted  Share Information with NAME: Robert Baldwin  Permission granted to share info w AGENCY: Home Health, DME  Permission granted to share info w Relationship: wife  Permission granted to share info w Contact Information: [phone]  Emotional Assessment Appearance:: Appears stated age Attitude/Demeanor/Rapport: Engaged Affect (typically observed): Accepting Orientation: : Oriented to Self, Oriented to Place, Oriented to  Time, Oriented to Situation   Psych Involvement: No (comment)  Admission diagnosis:  Severe pulmonary hypertension (HCC) [I27.20] 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) [I50.813] Patient Active Problem List   Diagnosis Date Noted   Severe pulmonary hypertension (HCC) 04/30/2022   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) 04/30/2022   Bilateral carotid artery occlusion 03/07/2012   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 - [place], [place] - [address] [place] [address] Phone: [phone] Fax: [phone]  [facility] - [address], [place] - [address] [place] [address] Phone: [phone] Fax: [phone]  [facility] [address] [place] [address] Phone: [phone] Fax: [phone]     Social Determinants of Health (SDOH) Interventions    Readmission Risk Interventions     No data to display

## 2022-05-08 NOTE — Progress Notes (Addendum)
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 PMH includes b/l carotid artery stenosis, DM II, HTN, HLD, and permanent atrial fibrillation.   OT comments  Pt at this time was further educated about pacing self with given visual monitoring of o2 with mobility and also given energy conservation handout. Pt on RA while sitting at EOB and standing 91-87%, pt with ambulation on RA 90-86% and on 2L vis [place] while ambulating was 93-96%. Pt voiced in session they are not going to use oxygen unless they really feel like they need it. Pt was shown throughout the session on how they respond when on and off oxygen. Pt currently with functional limitations due to the deficits listed below (see OT Problem List).  Pt will benefit from skilled OT to increase their safety and independence with ADL and functional mobility for ADL to facilitate discharge to venue listed below.     Recommendations for follow up therapy are one component of a multi-disciplinary discharge planning process, led by the attending physician.  Recommendations may be updated based on patient status, additional functional criteria and insurance authorization.    Follow Up Recommendations  Outpatient OT    Assistance Recommended at Discharge    Patient can return home with the following  Assistance with cooking/housework;Assist for transportation   Equipment Recommendations  None recommended by OT    Recommendations for Other Services      Precautions / Restrictions Precautions Precautions: Other (comment) Precaution Comments: monitor O2 Restrictions Weight Bearing Restrictions: No       Mobility Bed Mobility Overal bed mobility: Independent, Modified Independent                  Transfers Overall  transfer level: Needs assistance Equipment used: None Transfers: Sit to/from Stand Sit to Stand: Supervision                 Balance Overall balance assessment: Needs assistance Sitting-balance support: No upper extremity supported, Feet supported Sitting balance-Leahy Scale: Good     Standing balance support: During functional activity Standing balance-Leahy Scale: Good                             ADL either performed or assessed with clinical judgement   ADL Overall ADL's : Needs assistance/impaired Eating/Feeding: Independent;Sitting   Grooming: Wash/dry hands;Wash/dry face;Supervision/safety;Cueing for safety;Standing   Upper Body Bathing: Set up;Sitting   Lower Body Bathing: Set up;Cueing for safety;Cueing for sequencing;Sit to/from stand   Upper Body Dressing : Set up;Sitting   Lower Body Dressing: Set up;Cueing for safety;Sit to/from stand   Toilet Transfer: Set up;Cueing for safety;Cueing for sequencing;Rolling walker (2 wheels)   Toileting- Clothing Manipulation and Hygiene: Supervision/safety;Sit to/from stand       Functional mobility during ADLs: Supervision/safety General ADL Comments: cues on o2 monitoring    Extremity/Trunk Assessment Upper Extremity Assessment Upper Extremity Assessment: Overall WFL for tasks assessed   Lower Extremity Assessment Lower Extremity Assessment: Defer to PT evaluation        Vision   Vision Assessment?: No apparent visual deficits Additional Comments: wears glasses   Perception     Praxis      Cognition Arousal/Alertness: Awake/alert Behavior During Therapy: WFL for tasks assessed/performed Overall Cognitive Status: Within  Functional Limits for tasks assessed                                 General Comments: pleasant and cooperative; wife and niece present at bedside        Exercises      Shoulder Instructions       General Comments      Pertinent Vitals/ Pain        Pain Assessment Pain Assessment: No/denies pain  Home Living                                          Prior Functioning/Environment              Frequency  Min 2X/week        Progress Toward Goals  OT Goals(current goals can now be found in the care plan section)  Progress towards OT goals: Progressing toward goals  Acute Rehab OT Goals Patient Stated Goal: to go home OT Goal Formulation: With patient Time For Goal Achievement: 05/15/22 Potential to Achieve Goals: Good ADL Goals Additional ADL Goal #1: Pt will be able verbalize 3 enery conservation stratigies with ADLS Additional ADL Goal #2: Pt will be able to complete standing ADLs for 15 mins with no cues on breathing techniques to keep o2 readins above 90%  Plan Discharge plan needs to be updated    Co-evaluation                 AM-PAC OT "6 Clicks" Daily Activity     Outcome Measure   Help from another person eating meals?: None Help from another person taking care of personal grooming?: None Help from another person toileting, which includes using toliet, bedpan, or urinal?: A Little Help from another person bathing (including washing, rinsing, drying)?: A Little Help from another person to put on and taking off regular upper body clothing?: None Help from another person to put on and taking off regular lower body clothing?: A Little 6 Click Score: 21    End of Session Equipment Utilized During Treatment: Gait belt;Oxygen  OT Visit Diagnosis: Muscle weakness (generalized) (M62.81)   Activity Tolerance Patient tolerated treatment well   Patient Left in chair;with call bell/phone within reach   Nurse Communication  (o2 status)        Time: 3295-1884 OT Time Calculation (min): 27 min  Charges: OT General Charges $OT Visit: 1 Visit OT Evaluation OT Treatments $Self Care/Home Management : 23-37 mins  Alphia Moh OTR/L  Acute Rehab Services  [phone] office  number [phone] pager number   Alphia Moh 05/08/2022, 10:22 AM

## 2022-05-08 NOTE — Discharge Instructions (Addendum)
Information on my medicine - Coumadin   (Warfarin)  This medication education was reviewed with me or my healthcare representative as part of my discharge preparation.   Why was Coumadin prescribed for you? Coumadin was prescribed for you because you have a blood clot or a medical condition that can cause an increased risk of forming blood clots. Blood clots can cause serious health problems by blocking the flow of blood to the heart, lung, or brain. Coumadin can prevent harmful blood clots from forming. As a reminder your indication for Coumadin is:  Stroke Prevention because of Atrial Fibrillation  What test will check on my response to Coumadin? While on Coumadin (warfarin) you will need to have an INR test regularly to ensure that your dose is keeping you in the desired range. The INR (international normalized ratio) number is calculated from the result of the laboratory test called prothrombin time (PT).  If an INR APPOINTMENT HAS NOT ALREADY BEEN MADE FOR YOU please schedule an appointment to have this lab work done by your health care provider within 7 days. Your INR goal is a number between:  2 to 3   What  do you need to  know  About  COUMADIN? Take Coumadin (warfarin) exactly as prescribed by your healthcare provider about the same time each day.  DO NOT stop taking without talking to the doctor who prescribed the medication.  Stopping without other blood clot prevention medication to take the place of Coumadin may increase your risk of developing a new clot or stroke.  Get refills before you run out.  What do you do if you miss a dose? If you miss a dose, take it as soon as you remember on the same day then continue your regularly scheduled regimen the next day.  Do not take two doses of Coumadin at the same time.  Important Safety Information A possible side effect of Coumadin (Warfarin) is an increased risk of bleeding. You should call your healthcare provider right away if you  experience any of the following: Bleeding from an injury or your nose that does not stop. Unusual colored urine (red or dark brown) or unusual colored stools (red or black). Unusual bruising for unknown reasons. A serious fall or if you hit your head (even if there is no bleeding).  Some foods or medicines interact with Coumadin (warfarin) and might alter your response to warfarin. To help avoid this: Eat a balanced diet, maintaining a consistent amount of Vitamin K. Notify your provider about major diet changes you plan to make. Avoid alcohol or limit your intake to 1 drink for women and 2 drinks for men per day. (1 drink is 5 oz. wine, 12 oz. beer, or 1.5 oz. liquor.)  Make sure that ANY health care provider who prescribes medication for you knows that you are taking Coumadin (warfarin).  Also make sure the healthcare provider who is monitoring your Coumadin knows when you have started a new medication including herbals and non-prescription products.  Coumadin (Warfarin)  Major Drug Interactions  Increased Warfarin Effect Decreased Warfarin Effect  Alcohol (large quantities) Antibiotics (esp. Septra/Bactrim, Flagyl, Cipro) Amiodarone (Cordarone) Aspirin (ASA) Cimetidine (Tagamet) Megestrol (Megace) NSAIDs (ibuprofen, naproxen, etc.) Piroxicam (Feldene) Propafenone (Rythmol SR) Propranolol (Inderal) Isoniazid (INH) Posaconazole (Noxafil) Barbiturates (Phenobarbital) Carbamazepine (Tegretol) Chlordiazepoxide (Librium) Cholestyramine (Questran) Griseofulvin Oral Contraceptives Rifampin Sucralfate (Carafate) Vitamin K   Coumadin (Warfarin) Major Herbal Interactions  Increased Warfarin Effect Decreased Warfarin Effect  Garlic Ginseng Ginkgo biloba Coenzyme Q10 Green   tea St. John's wort    Coumadin (Warfarin) FOOD Interactions  Eat a consistent number of servings per week of foods HIGH in Vitamin K (1 serving =  cup)  Collards (cooked, or boiled & drained) Kale  (cooked, or boiled & drained) Mustard greens (cooked, or boiled & drained) Parsley *serving size only =  cup Spinach (cooked, or boiled & drained) Swiss chard (cooked, or boiled & drained) Turnip greens (cooked, or boiled & drained)  Eat a consistent number of servings per week of foods MEDIUM-HIGH in Vitamin K (1 serving = 1 cup)  Asparagus (cooked, or boiled & drained) Broccoli (cooked, boiled & drained, or raw & chopped) Brussel sprouts (cooked, or boiled & drained) *serving size only =  cup Lettuce, raw (green leaf, endive, romaine) Spinach, raw Turnip greens, raw & chopped   These websites have more information on Coumadin (warfarin):  www.coumadin.com; www.ahrq.gov/consumer/coumadin.htm;   

## 2022-05-10 ENCOUNTER — Telehealth: Payer: Self-pay | Admitting: *Deleted

## 2022-05-10 NOTE — Telephone Encounter (Signed)
HF TOC CM received call from [facility] with [facility] Rheumatology requesting ANA results. Faxed dc summary and labwork to office. They are setting up follow up appt for pt to see Rheumatologist. Isidoro Donning RN3 CCM, Heart Failure TOC CM [phone]

## 2022-05-11 DIAGNOSIS — I10 Essential (primary) hypertension: Secondary | ICD-10-CM | POA: Diagnosis not present

## 2022-05-11 DIAGNOSIS — R609 Edema, unspecified: Secondary | ICD-10-CM | POA: Diagnosis not present

## 2022-05-11 DIAGNOSIS — I272 Pulmonary hypertension, unspecified: Secondary | ICD-10-CM | POA: Diagnosis not present

## 2022-05-11 DIAGNOSIS — Z299 Encounter for prophylactic measures, unspecified: Secondary | ICD-10-CM | POA: Diagnosis not present

## 2022-05-11 DIAGNOSIS — I4891 Unspecified atrial fibrillation: Secondary | ICD-10-CM | POA: Diagnosis not present

## 2022-05-11 DIAGNOSIS — Z6831 Body mass index (BMI) 31.0-31.9, adult: Secondary | ICD-10-CM | POA: Diagnosis not present

## 2022-05-14 ENCOUNTER — Telehealth: Payer: Self-pay | Admitting: *Deleted

## 2022-05-14 ENCOUNTER — Other Ambulatory Visit: Payer: Self-pay

## 2022-05-14 DIAGNOSIS — I6523 Occlusion and stenosis of bilateral carotid arteries: Secondary | ICD-10-CM

## 2022-05-14 NOTE — Telephone Encounter (Signed)
   Name: Robert Baldwin  DOB: November 03, 1940  MRN: [id number]  Primary Cardiologist: [name], MD  Chart reviewed as part of pre-operative protocol coverage. The patient has an upcoming visit scheduled with the [facility] on 05/18/2022 at which time clearance can be addressed in case there are any issues that would impact surgical recommendations.  L Carotid endarterectomy is not scheduled until 05/28/2022 as below. I added preop FYI to appointment note so that provider is aware to address at time of outpatient visit.  Per office protocol the cardiology provider should for their finalize clearance decision and recommendations regarding antiplatelet therapy to the requesting party below.    This message will also be routed to pharmacy pool and/or Dr Diona Browner for input on holding warfarin as requested below so that this information is available to the clearing provider at time of patient's appointment.   I will route this message as FYI to requesting party and remove this message from the preop box as separate preop APP input not needed at this time.   Please call with any questions.  Joylene Grapes, NP  05/14/2022, 4:19 PM

## 2022-05-14 NOTE — Telephone Encounter (Signed)
   Pre-operative Risk Assessment    Patient Name: Robert Baldwin  DOB: 05-Jul-1941 MRN: [id number]      Request for Surgical Clearance    Procedure:   LEFT CAROTID ENDARTERECTOMY, FOLLOWED BY A RIGHT CAROTID ENDARTERECTOMY   Date of Surgery:  Clearance 05/28/22                                 Surgeon:  DR. Sherald Hess Surgeon's Group or Practice Name:  [facility] Phone number:  [phone] Fax number:  [phone]   Type of Clearance Requested:   - Medical  - Pharmacy:  Hold Warfarin (Coumadin)     Type of Anesthesia:  General    Additional requests/questions:    Elpidio Anis   05/14/2022, 3:16 PM

## 2022-05-15 NOTE — Telephone Encounter (Signed)
Patient with diagnosis of afib on warfarin for anticoagulation.    Procedure: left and right carotid endarterectomy Date of procedure: 05/28/22  CHA2DS2-VASc Score = 6  This indicates a 9.7% annual risk of stroke. The patient's score is based upon: CHF History: 1 HTN History: 1 Diabetes History: 1 Stroke History: 0 Vascular Disease History: 1 Age Score: 2 Gender Score: 0  CrCl 70mL/min Platelet count 205K  Per office protocol, patient can hold warfarin for 5 days prior to procedure. Patient will not need bridging with Lovenox (enoxaparin) around procedure. PCP is managing his warfarin.  **This guidance is not considered finalized until pre-operative APP has relayed final recommendations.**

## 2022-05-17 ENCOUNTER — Telehealth (HOSPITAL_COMMUNITY): Payer: Self-pay | Admitting: Pharmacist

## 2022-05-17 NOTE — Telephone Encounter (Signed)
Received message from patient's wife. They received Opsumit in the mail and weren't sure when they were supposed to start it. Instructed them to not start the Opsumit and we will discuss when to start the medication at the follow up visit with [facility] tomorrow. Will likely initiate after upcoming procedure. Will add Opsumit to medication list.    Karle Plumber, PharmD, BCPS, BCCP, CPP Heart Failure Clinic Pharmacist [phone]

## 2022-05-18 ENCOUNTER — Encounter (HOSPITAL_COMMUNITY): Payer: Self-pay

## 2022-05-18 ENCOUNTER — Ambulatory Visit (HOSPITAL_COMMUNITY)
Admit: 2022-05-18 | Discharge: 2022-05-18 | Disposition: A | Discharge status: Home / Self Care | Payer: Medicare PPO | Attending: Family Medicine | Admitting: Family Medicine

## 2022-05-18 VITALS — BP 160/50 | HR 44 | Ht 67.0 in | Wt 178.2 lb

## 2022-05-18 DIAGNOSIS — I110 Hypertensive heart disease with heart failure: Secondary | ICD-10-CM | POA: Diagnosis not present

## 2022-05-18 DIAGNOSIS — I272 Pulmonary hypertension, unspecified: Secondary | ICD-10-CM | POA: Diagnosis not present

## 2022-05-18 DIAGNOSIS — I4821 Permanent atrial fibrillation: Secondary | ICD-10-CM | POA: Diagnosis not present

## 2022-05-18 DIAGNOSIS — I50812 Chronic right heart failure: Secondary | ICD-10-CM | POA: Diagnosis not present

## 2022-05-18 DIAGNOSIS — Z7901 Long term (current) use of anticoagulants: Secondary | ICD-10-CM | POA: Diagnosis not present

## 2022-05-18 DIAGNOSIS — E785 Hyperlipidemia, unspecified: Secondary | ICD-10-CM | POA: Diagnosis not present

## 2022-05-18 DIAGNOSIS — Z79899 Other long term (current) drug therapy: Secondary | ICD-10-CM | POA: Diagnosis not present

## 2022-05-18 DIAGNOSIS — E1159 Type 2 diabetes mellitus with other circulatory complications: Secondary | ICD-10-CM

## 2022-05-18 DIAGNOSIS — I6523 Occlusion and stenosis of bilateral carotid arteries: Secondary | ICD-10-CM

## 2022-05-18 DIAGNOSIS — E119 Type 2 diabetes mellitus without complications: Secondary | ICD-10-CM | POA: Insufficient documentation

## 2022-05-18 DIAGNOSIS — I5032 Chronic diastolic (congestive) heart failure: Secondary | ICD-10-CM | POA: Insufficient documentation

## 2022-05-18 DIAGNOSIS — I2721 Secondary pulmonary arterial hypertension: Secondary | ICD-10-CM | POA: Insufficient documentation

## 2022-05-18 LAB — CBC
HCT: 37.8 % — ABNORMAL LOW (ref 39.0–52.0)
Hemoglobin: 12.2 g/dL — ABNORMAL LOW (ref 13.0–17.0)
MCH: 31.6 pg (ref 26.0–34.0)
MCHC: 32.3 g/dL (ref 30.0–36.0)
MCV: 97.9 fL (ref 80.0–100.0)
Platelets: 270 10*3/uL (ref 150–400)
RBC: 3.86 MIL/uL — ABNORMAL LOW (ref 4.22–5.81)
RDW: 18.6 % — ABNORMAL HIGH (ref 11.5–15.5)
WBC: 7.3 10*3/uL (ref 4.0–10.5)
nRBC: 0 % (ref 0.0–0.2)

## 2022-05-18 LAB — BASIC METABOLIC PANEL
Anion gap: 9 (ref 5–15)
BUN: 40 mg/dL — ABNORMAL HIGH (ref 8–23)
CO2: 31 mmol/L (ref 22–32)
Calcium: 9.4 mg/dL (ref 8.9–10.3)
Chloride: 98 mmol/L (ref 98–111)
Creatinine, Ser: 1.45 mg/dL — ABNORMAL HIGH (ref 0.61–1.24)
GFR, Estimated: 49 mL/min — ABNORMAL LOW (ref >60)
Glucose, Bld: 163 mg/dL — ABNORMAL HIGH (ref 70–99)
Potassium: 5.1 mmol/L (ref 3.5–5.1)
Sodium: 138 mmol/L (ref 135–145)

## 2022-05-18 LAB — PROTIME-INR
INR: 1.8 — ABNORMAL HIGH (ref 0.8–1.2)
Prothrombin Time: 20.4 seconds — ABNORMAL HIGH (ref 11.4–15.2)

## 2022-05-18 MED ORDER — ENTRESTO 24-26 MG PO TABS: 1.0000 | ORAL_TABLET | Freq: Two times a day (BID) | ORAL | 3 refills | Status: DC

## 2022-05-18 NOTE — Progress Notes (Addendum)
ADVANCED HF CLINIC CONSULT NOTE   Primary Care: Kirstie Peri, MD Primary Cardiologist: Dr. Diona Browner HF Cardiologist: Dr. Shirlee Latch  HPI: Robert Baldwin is a 81 y.o. male with history of b/l carotid artery stenosis, DM II, HTN, HLD, permanent atrial fibrillation.  He follows with Dr. Diona Browner in the cardiology clinic.    Saw [facility] for carotid artery stenosis 06/21. Was noting more dyspnea with exertion and lower extremity edema for about a month. He was subsequently started on po lasix 20 mg daily.   He was seen by Dr. Diona Browner in the clinic 07/06 for evaluation of recent symptoms and pre-operative evaluation in preparation for left CEA followed by staged right CEA. Echo day of the visit showed EF 70-75%, hyperdynamic LV function, mild LVH, interventricular septum flattened in systole and diastole consistent with RV pressure and volume overload, RV moderately enlarged and moderately reduced, RVSP 120 mmHg, moderate LAE, severe RAE, moderate pericardial effusion, severe TR, dilated IVC. Patient was scheduled for outpatient R/LHC to better assess hemodynamics and further characterize pulmonary hypertension.    [facility] 7/23 showed no significant CAD, RA mean 25, PA 115/30 (58), LVEDP 22 mmHg, PCWP not obtained, Fick CO/CI 6.4/3.1, PAPi 3.4. PVR 5.6 WU. 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 He was diuresed with IV lasix. V/Q scan negative for chronic PE. ANA+. All other serologic test -.  HRCT - no definite ILD with probable mild pulmonary hypertension. Started on tadalafil. Diuresed 24 lbs. Hospitalization complicated by right groin hematoma and received 1 unit PRBCs. Discharged home, weight 179 lbs.  Today he returns for post hospital HF follow up with his wife. Overall feeling fine. No longer SOB bringing trash can up driveway. Right ankle swollen. Denies palpitations, CP, dizziness, or PND/Orthopnea. Appetite ok. No fever or chills. Weight at  home 172 pounds. Taking all medications. BP at home ~ 142/78. Has not started Opsumit yet. Wife says he snores. Says he was d/c with oxygen but refuses to wear.  ECG (personally reviewed): Atrial fibrillation with slow VR  Labs (6/23): K 3.8, creatinine 1.53  Cardiac Studies - R/LHC (7/23): no significant CAD, RA mean 25, PA 115/30 (58), LVEDP 22 mmHg, PCWP not obtained, Fick CO/CI 6.4/3.1, PAPi 3.4. PVR 5.6 WU.  - Echo (7/23): EF 70-75%, hyperdynamic LV function, mild LVH, interventricular septum flattened in systole and diastole consistent with RV pressure and volume overload, RV moderately enlarged and moderately reduced, RVSP 120 mmHg, moderate LAE, severe RAE, moderate pericardial effusion, severe TR, dilated IVC  Review of Systems: [y] = yes, [ ]  = no   General: Weight gain [ ] ; Weight loss [ ] ; Anorexia [ ] ; Fatigue [ ] ; Fever [ ] ; Chills [ ] ; Weakness [ ]   Cardiac: Chest pain/pressure [ ] ; Resting SOB [ ] ; Exertional SOB Cove.Etienne ]; Orthopnea [ ] ; Pedal Edema [ y]; Palpitations [ ] ; Syncope [ ] ; Presyncope [ ] ; Paroxysmal nocturnal dyspnea[ ]   Pulmonary: Cough [ ] ; Wheezing[ ] ; Hemoptysis[ ] ; Sputum [ ] ; Snoring Cove.[place]ienne ]  GI: Vomiting[ ] ; Dysphagia[ ] ; Melena[ ] ; Hematochezia [ ] ; Heartburn[ ] ; Abdominal pain [ ] ; Constipation [ ] ; Diarrhea [ ] ; BRBPR [ ]   GU: Hematuria[ ] ; Dysuria [ ] ; Nocturia[ ]   Vascular: Pain in legs with walking [ ] ; Pain in feet with lying flat [ ] ; Non-healing sores [ ] ; Stroke [ ] ; TIA [ ] ; Slurred speech [ ] ;  Neuro: Headaches[ ] ; Vertigo[ ] ; Seizures[ ] ; Paresthesias[ ] ;Blurred  vision [ ] ; Diplopia [ ] ; Vision changes [ ]   Ortho/Skin: Arthritis [ ] ; Joint pain [ ] ; Muscle pain [ ] ; Joint swelling [ ] ; Back Pain [ ] ; Rash [ ]   Psych: Depression[ ] ; Anxiety[ ]   Heme: Bleeding problems [ ] ; Clotting disorders [ ] ; Anemia [ ]   Endocrine: Diabetes ]; Thyroid dysfunction[ ]    Past Medical History:  Diagnosis Date   Atrial fibrillation (HCC)    Hyperlipidemia     Hypertension    Type 2 diabetes mellitus (HCC)     Current Outpatient Medications  Medication Sig Dispense Refill   atorvastatin (LIPITOR) 40 MG tablet Take 40 mg by mouth at bedtime.     cholecalciferol (VITAMIN D) 25 MCG (1000 UNIT) tablet Take 1,000 Units by mouth in the morning and at bedtime.     dapagliflozin propanediol (FARXIGA) 10 MG TABS tablet Take 1 tablet (10 mg total) by mouth daily. 30 tablet 5   fish oil-omega-3 fatty acids 1000 MG capsule Take 1 g by mouth every evening.     macitentan (OPSUMIT) 10 MG tablet Take 10 mg by mouth daily.     Misc Natural Products (GLUCOS-CHONDROIT-MSM COMPLEX) TABS Take 2 tablets by mouth daily.     Multiple Vitamins-Minerals (MULTIVITAMIN WITH MINERALS) tablet Take 1 tablet by mouth in the morning.  Centrum Silver     niacin 500 MG tablet Take 500 mg by mouth in the morning.     spironolactone (ALDACTONE) 25 MG tablet Take 1/2 tablet (12.5 mg total) by mouth daily. 30 tablet 5   tadalafil, PAH, (ADCIRCA) 20 MG tablet Take 2 tablets (40 mg total) by mouth daily. 60 tablet 5   torsemide (DEMADEX) 20 MG tablet Take 1 tablet (20 mg total) by mouth daily. 30 tablet 6   warfarin (COUMADIN) 5 MG tablet Take 5-7.5 mg by mouth See admin instructions. Managed by Dr. office Take 1.5 tablets (7.5 mg) by mouth in the evenings on Sundays & Thursdays. Take 1 tablet (5 mg) by mouth in the evening on Mondays, Tuesdays, Wednesdays, Fridays & Saturdays.     No current facility-administered medications for this encounter.   No Known Allergies  Social History   Socioeconomic History   Marital status: Married    Spouse name: Not on file   Number of children: Not on file   Years of education: Not on file   Highest education level: Not on file  Occupational History   Occupation: Full time    Comment: Contractor  Tobacco Use   Smoking status: Never   Smokeless tobacco: Never   Tobacco comments:    Never smoke 05/18/22  Vaping Use   Vaping Use:  Never used  Substance and Sexual Activity   Alcohol use: No    Alcohol/week: 0.0 standard drinks of alcohol   Drug use: No   Sexual activity: Not on file  Other Topics Concern   Not on file  Social History Narrative   Not on file   Social Determinants of Health   Financial Resource Strain: Not on file  Food Insecurity: Not on file  Transportation Needs: Not on file  Physical Activity: Not on file  Stress: Not on file  Social Connections: Not on file  Intimate Partner Violence: Not on file   Family History  Problem Relation Age of Onset   Stroke Other    Heart disease Other    BP (!) 160/50   Pulse (!) 44   Ht 5\' 7"  (1.702  m)   Wt 80.8 kg (178 lb 3.2 oz)   SpO2 92%   BMI 27.91 kg/m   Wt Readings from Last 3 Encounters:  05/18/22 80.8 kg (178 lb 3.2 oz)  05/08/22 81.6 kg (180 lb)  04/26/22 95.5 kg (210 lb 9.6 oz)   PHYSICAL EXAM: General:  NAD. No resp difficulty, walked into clinic HEENT: Normal Neck: Supple. No JVD. Carotids 2+ bilat; no bruits. No lymphadenopathy or thryomegaly appreciated. Cor: PMI nondisplaced. Brady irregular rate & rhythm. No rubs, gallops or murmurs. Lungs: Clear Abdomen: Soft, nontender, nondistended. No hepatosplenomegaly. No bruits or masses. Good bowel sounds. Extremities: No cyanosis, clubbing, rash, trace pedal edema R/L Neuro: Alert & oriented x 3, cranial nerves grossly intact. Moves all 4 extremities w/o difficulty. Affect pleasant.  ASSESSMENT & PLAN: 1. 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 RHC/LHC 7/23 preserved cardiac output. Diuresed 24 lbs. Improved NYHA II, he is not volume overloaded today. - Place  compression hose. - Start Entresto 24/26 mg bid. BMET today, repeat in 10 days. - Continue torsemide 20 mg daily. May need to decrease at next visit. - Continue spironolactone 25 mg daily.  - Continue Farxiga 10 mg daily (avoid Actos in future) 2. Pulmonary arterial hypertension: Severe PAH on RHC today, PVR 5.6 WU.  He has never smoked and has no known lung disease.  He has been on warfarin for atrial fibrillation long-term. No known rheumatologic illness.  Not a drinker though he has some signs of cirrhosis by prior liver imaging.  - V/Q scan negative for chronic PE  - High resolution CT chest completed, no definite ILD with probable mild pulmonary hypertension.  - Sent serologic workup>>anti-SCL 70 neg, anti-centromere Ab neg, RF neg, ANA with reflex positive. 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 He has been referred to rheumatology for further w/u. - He has refused home oxygen. Sats 92% on room air today. - Start Opsumit 10 mg daily. Discussed with Dr. Shirlee Latch. - Continue tadalafil 40 mg daily. - Arrange home sleep study. - Repeat RHC 1-2 weeks (see #5) to evaluate PA pressures. 3. Atrial fibrillation: Permanent.   - Continue warfarin. Check INR today.  - Bradycardic, does not need nodal blocker.  4. DM2: Continue SGLT2i 5. Carotid stenosis: CTA neck in 6/23 showed high grade bilateral ICA stenoses. Asymptomatic. No bruits on exam. - Continue statin.  - He is on warfarin for AF. - Severe PAH is going to be a significant risk for general anesthesia for CEA. Plan to titrate selective pulmonary vasodilators up relatively rapidly then repeat RHC soon to see if we have been able to bring down his PA pressure before surgery. I have reached out to [name] to discuss pushing back scheduled surgery (05/28/22) a couple weeks to allow for RHC. Discussed with Dr. Shirlee Latch. Discussed this with the patient today.  Arrange for RHC in 1-2 weeks with Dr. Shirlee Latch. OK to hold warfarin 1-2 days before cath. Follow up with  APP 2 weeks after catheterization and 8 weeks with Dr. Shirlee Latch.  Prince Rome, FNP-BC 05/18/22

## 2022-05-18 NOTE — Addendum Note (Signed)
Encounter addended by: Jacklynn Ganong, FNP on: 05/18/2022 4:58 PM  Actions taken: Clinical Note Signed

## 2022-05-18 NOTE — Addendum Note (Signed)
Encounter addended by: Jacklynn Ganong, FNP on: 05/18/2022 4:54 PM  Actions taken: Clinical Note Signed

## 2022-05-18 NOTE — Patient Instructions (Addendum)
START opsumit  START Entresto 24-26mg  twice daily  You have been scheduled for a right heart cath (See attached instruction letter)  Your provider requests you have a sleep study  Follow up 2 weeks after cath   Do the following things EVERYDAY: Weigh yourself in the morning before breakfast. Write it down and keep it in a log. Take your medicines as prescribed Eat low salt foods--Limit salt (sodium) to 2000 mg per day.  Stay as active as you can everyday Limit all fluids for the day to less than 2 liters

## 2022-05-22 ENCOUNTER — Telehealth (HOSPITAL_COMMUNITY): Payer: Self-pay

## 2022-05-22 DIAGNOSIS — I10 Essential (primary) hypertension: Secondary | ICD-10-CM

## 2022-05-22 NOTE — Telephone Encounter (Signed)
Lab order placed and appointment scheduled.  Pt and pt's wife aware, agreeable, and verbalized understanding

## 2022-05-22 NOTE — Telephone Encounter (Signed)
-----   Message from Jacklynn Ganong, [place] sent at 05/22/2022  1:21 PM EDT ----- Please call

## 2022-05-23 ENCOUNTER — Telehealth (HOSPITAL_COMMUNITY): Payer: Self-pay | Admitting: *Deleted

## 2022-05-23 NOTE — Telephone Encounter (Signed)
[facility] auth request approved  Approved Authorization [id number]  Tracking [id number]

## 2022-05-25 ENCOUNTER — Other Ambulatory Visit (HOSPITAL_COMMUNITY): Payer: Medicare PPO

## 2022-05-28 ENCOUNTER — Telehealth (HOSPITAL_COMMUNITY): Payer: Self-pay | Admitting: *Deleted

## 2022-05-28 ENCOUNTER — Encounter (HOSPITAL_COMMUNITY)
Admission: RE | Disposition: A | Discharge status: Home / Self Care | Payer: Self-pay | Source: Home / Self Care | Attending: Cardiology

## 2022-05-28 ENCOUNTER — Other Ambulatory Visit: Payer: Self-pay

## 2022-05-28 ENCOUNTER — Telehealth (HOSPITAL_COMMUNITY): Payer: Self-pay | Admitting: Pharmacy Technician

## 2022-05-28 ENCOUNTER — Ambulatory Visit (HOSPITAL_COMMUNITY)
Admission: RE | Admit: 2022-05-28 | Discharge: 2022-05-28 | Disposition: A | Discharge status: Home / Self Care | Payer: Medicare PPO | Attending: Cardiology | Admitting: Cardiology

## 2022-05-28 DIAGNOSIS — E785 Hyperlipidemia, unspecified: Secondary | ICD-10-CM | POA: Diagnosis not present

## 2022-05-28 DIAGNOSIS — Z7901 Long term (current) use of anticoagulants: Secondary | ICD-10-CM | POA: Insufficient documentation

## 2022-05-28 DIAGNOSIS — I6523 Occlusion and stenosis of bilateral carotid arteries: Secondary | ICD-10-CM | POA: Insufficient documentation

## 2022-05-28 DIAGNOSIS — I110 Hypertensive heart disease with heart failure: Secondary | ICD-10-CM | POA: Insufficient documentation

## 2022-05-28 DIAGNOSIS — I4821 Permanent atrial fibrillation: Secondary | ICD-10-CM | POA: Insufficient documentation

## 2022-05-28 DIAGNOSIS — I50813 Acute on chronic right heart failure: Secondary | ICD-10-CM

## 2022-05-28 DIAGNOSIS — I5033 Acute on chronic diastolic (congestive) heart failure: Secondary | ICD-10-CM | POA: Insufficient documentation

## 2022-05-28 DIAGNOSIS — Z79899 Other long term (current) drug therapy: Secondary | ICD-10-CM | POA: Diagnosis not present

## 2022-05-28 DIAGNOSIS — I272 Pulmonary hypertension, unspecified: Secondary | ICD-10-CM | POA: Diagnosis not present

## 2022-05-28 DIAGNOSIS — E875 Hyperkalemia: Secondary | ICD-10-CM

## 2022-05-28 DIAGNOSIS — E119 Type 2 diabetes mellitus without complications: Secondary | ICD-10-CM | POA: Diagnosis not present

## 2022-05-28 DIAGNOSIS — I50812 Chronic right heart failure: Secondary | ICD-10-CM

## 2022-05-28 HISTORY — PX: RIGHT HEART CATH: CATH118263

## 2022-05-28 LAB — BASIC METABOLIC PANEL
Anion gap: 8 (ref 5–15)
BUN: 66 mg/dL — ABNORMAL HIGH (ref 8–23)
CO2: 26 mmol/L (ref 22–32)
Calcium: 9.2 mg/dL (ref 8.9–10.3)
Chloride: 105 mmol/L (ref 98–111)
Creatinine, Ser: 1.73 mg/dL — ABNORMAL HIGH (ref 0.61–1.24)
GFR, Estimated: 39 mL/min — ABNORMAL LOW (ref >60)
Glucose, Bld: 140 mg/dL — ABNORMAL HIGH (ref 70–99)
Potassium: 5.5 mmol/L — ABNORMAL HIGH (ref 3.5–5.1)
Sodium: 139 mmol/L (ref 135–145)

## 2022-05-28 LAB — POCT I-STAT EG7
Acid-Base Excess: 3 mmol/L — ABNORMAL HIGH (ref 0.0–2.0)
Acid-Base Excess: 2 mmol/L (ref 0.0–2.0)
Bicarbonate: 28 mmol/L (ref 20.0–28.0)
Bicarbonate: 26.7 mmol/L (ref 20.0–28.0)
Calcium, Ion: 1.22 mmol/L (ref 1.15–1.40)
Calcium, Ion: 1.22 mmol/L (ref 1.15–1.40)
HCT: 33 % — ABNORMAL LOW (ref 39.0–52.0)
HCT: 33 % — ABNORMAL LOW (ref 39.0–52.0)
Hemoglobin: 11.2 g/dL — ABNORMAL LOW (ref 13.0–17.0)
Hemoglobin: 11.2 g/dL — ABNORMAL LOW (ref 13.0–17.0)
O2 Saturation: 70 %
O2 Saturation: 69 %
Potassium: 4.4 mmol/L (ref 3.5–5.1)
Potassium: 4.3 mmol/L (ref 3.5–5.1)
Sodium: 139 mmol/L (ref 135–145)
Sodium: 140 mmol/L (ref 135–145)
TCO2: 29 mmol/L (ref 22–32)
TCO2: 28 mmol/L (ref 22–32)
pCO2, Ven: 44 mmHg (ref 44–60)
pCO2, Ven: 41.9 mmHg — ABNORMAL LOW (ref 44–60)
pH, Ven: 7.412 (ref 7.25–7.43)
pH, Ven: 7.411 (ref 7.25–7.43)
pO2, Ven: 36 mmHg (ref 32–45)
pO2, Ven: 36 mmHg (ref 32–45)

## 2022-05-28 LAB — GLUCOSE, CAPILLARY: Glucose-Capillary: 123 mg/dL — ABNORMAL HIGH (ref 70–99)

## 2022-05-28 LAB — PROTIME-INR
INR: 1.1 (ref 0.8–1.2)
Prothrombin Time: 14.2 seconds (ref 11.4–15.2)

## 2022-05-28 SURGERY — RIGHT HEART CATH
Anesthesia: LOCAL

## 2022-05-28 MED ORDER — HEPARIN (PORCINE) IN NACL 1000-0.9 UT/500ML-% IV SOLN
INTRAVENOUS | Status: AC
  Filled 2022-05-28: qty 500

## 2022-05-28 MED ORDER — SODIUM CHLORIDE 0.9 % IV SOLN
250.0000 mL | INTRAVENOUS | Status: DC | PRN
Start: 2022-05-28 — End: 2022-05-28

## 2022-05-28 MED ORDER — AMLODIPINE BESYLATE 5 MG PO TABS: 5.0000 mg | ORAL_TABLET | Freq: Every day | ORAL | 11 refills | Status: DC

## 2022-05-28 MED ORDER — SODIUM CHLORIDE 0.9 % IV SOLN: 250.0000 mL | INTRAVENOUS | Status: DC | PRN

## 2022-05-28 MED ORDER — LIDOCAINE HCL (PF) 1 % IJ SOLN
INTRAMUSCULAR | Status: DC | PRN
  Administered 2022-05-28: 2 mL

## 2022-05-28 MED ORDER — ONDANSETRON HCL 4 MG/2ML IJ SOLN: 4.0000 mg | Freq: Four times a day (QID) | INTRAMUSCULAR | Status: DC | PRN

## 2022-05-28 MED ORDER — HEPARIN (PORCINE) IN NACL 1000-0.9 UT/500ML-% IV SOLN
INTRAVENOUS | Status: DC | PRN
  Administered 2022-05-28: 500 mL

## 2022-05-28 MED ORDER — LIDOCAINE HCL (PF) 1 % IJ SOLN
INTRAMUSCULAR | Status: AC
  Filled 2022-05-28: qty 30

## 2022-05-28 MED ORDER — HEPARIN SODIUM (PORCINE) 1000 UNIT/ML IJ SOLN
INTRAMUSCULAR | Status: AC
  Filled 2022-05-28: qty 10

## 2022-05-28 MED ORDER — SODIUM CHLORIDE 0.9 % IV SOLN
INTRAVENOUS | Status: DC
Start: 2022-05-28 — End: 2022-05-28

## 2022-05-28 MED ORDER — SODIUM CHLORIDE 0.9% FLUSH: 3.0000 mL | Freq: Two times a day (BID) | INTRAVENOUS | Status: DC

## 2022-05-28 MED ORDER — TORSEMIDE 20 MG PO TABS: ORAL_TABLET | ORAL | 6 refills | Status: DC

## 2022-05-28 MED ORDER — VERAPAMIL HCL 2.5 MG/ML IV SOLN
INTRAVENOUS | Status: AC
  Filled 2022-05-28: qty 2

## 2022-05-28 MED ORDER — SODIUM CHLORIDE 0.9% FLUSH
3.0000 mL | INTRAVENOUS | Status: DC | PRN
Start: 2022-05-28 — End: 2022-05-28

## 2022-05-28 MED ORDER — LABETALOL HCL 5 MG/ML IV SOLN: 10.0000 mg | INTRAVENOUS | Status: DC | PRN

## 2022-05-28 MED ORDER — ACETAMINOPHEN 325 MG PO TABS: 650.0000 mg | ORAL_TABLET | ORAL | Status: DC | PRN

## 2022-05-28 MED ORDER — HEPARIN (PORCINE) IN NACL 1000-0.9 UT/500ML-% IV SOLN
INTRAVENOUS | Status: AC
  Filled 2022-05-28: qty 1000

## 2022-05-28 MED ORDER — SODIUM CHLORIDE 0.9% FLUSH: 3.0000 mL | INTRAVENOUS | Status: DC | PRN

## 2022-05-28 MED ORDER — HYDRALAZINE HCL 20 MG/ML IJ SOLN: 10.0000 mg | INTRAMUSCULAR | Status: DC | PRN

## 2022-05-28 SURGICAL SUPPLY — 8 items
CATH BALLN WEDGE 5F 110CM (CATHETERS) ×1 IMPLANT
GUIDEWIRE .025 260CM (WIRE) ×1 IMPLANT
KIT HEART LEFT (KITS) ×2 IMPLANT
PACK CARDIAC CATHETERIZATION (CUSTOM PROCEDURE TRAY) ×2 IMPLANT
PROTECTION STATION PRESSURIZED (MISCELLANEOUS) ×2
SHEATH GLIDE SLENDER 4/5FR (SHEATH) ×1 IMPLANT
STATION PROTECTION PRESSURIZED (MISCELLANEOUS) IMPLANT
TRANSDUCER W/STOPCOCK (MISCELLANEOUS) ×2 IMPLANT

## 2022-05-28 NOTE — Discharge Instructions (Addendum)
1. Increase torsemide to 40 mg daily alternating with 20 mg daily.  2. Will work on getting you a new pulmonary hypertension medication called Uptravi.  3. I spoke with [name], he will call you about coming to see him in the office to discuss surgical risk.  4. Take warfarin tonight.  5. Stop spironolactone for now.  6. Stop Entresto for now.  7. Amlodipine 5 mg daily    Brachial Site Care  This sheet gives you information about how to care for yourself after your procedure. Your health care provider may also give you more specific instructions. If you have problems or questions, contact your health care provider. What can I expect after the procedure? After the procedure, it is common to have: Bruising and tenderness at the catheter insertion area. Follow these instructions at home: Medicines Take over-the-counter and prescription medicines only as told by your health care provider. Insertion site care Follow instructions from your health care provider about how to take care of your insertion site. Make sure you: Wash your hands with soap and water before you change your bandage (dressing). If soap and water are not available, use hand sanitizer. Remove your dressing as told by your health care provider. In 24 hours Check your insertion site every day for signs of infection. Check for: Redness, swelling, or pain. Fluid or blood. Pus or a bad smell. Warmth. Do not take baths, swim, or use a hot tub until your health care provider approves. You may shower 24-48 hours after the procedure, or as directed by your health care provider. Remove the dressing and gently wash the site with plain soap and water. Pat the area dry with a clean towel. Do not rub the site. That could cause bleeding. Do not apply powder or lotion to the site. Activity   For 24 hours after the procedure, or as directed by your health care provider: Do not flex or bend the affected arm. Do not push or pull  heavy objects with the affected arm. Do not drive yourself home from the hospital or clinic. You may drive 24 hours after the procedure unless your health care provider tells you not to. Do not operate machinery or power tools. Do not lift anything that is heavier than 10 lb (4.5 kg), or the limit that you are told, until your health care provider says that it is safe.  For 2 days Ask your health care provider when it is okay to: Return to work or school. Resume usual physical activities or sports. Resume sexual activity. General instructions If the catheter site starts to bleed, put firm pressure on the site.  If you went home on the same day as your procedure, a responsible adult should be with you for the first 24 hours after you arrive home. Keep all follow-up visits as told by your health care provider. This is important. Contact a health care provider if: You have a fever. You have redness, swelling, or yellow drainage around your insertion site. Get help right away if: The catheter insertion area swells very fast. The insertion area is bleeding, and the bleeding does not stop when you hold steady pressure on the area. These symptoms may represent a serious problem that is an emergency. Do not wait to see if the symptoms will go away. Get medical help right away. Call your local emergency services (911 in the [place] Do not drive yourself to the hospital. Summary After the procedure, it is common to have  bruising and tenderness at the site. Follow instructions from your health care provider about how to take care of your radial site wound. Check the wound every day for signs of infection. Do not lift anything that is heavier than 10 lb (4.5 kg), or the limit that you are told, until your health care provider says that it is safe. This information is not intended to replace advice given to you by your health care provider. Make sure you discuss any questions you have with your health care  provider. Document Revised: 11/13/2017 Document Reviewed: 11/13/2017 Elsevier Patient Education  2020 [employer].

## 2022-05-28 NOTE — Telephone Encounter (Signed)
Robert Morale, MD  Landry Dyke M, RN; [facility] Triage Pool; Trevor Iha, Geroge Baseman, RN; Evon Slack, RPH-CPP In addition to the other changes, he will need to start on amlodipine 5 mg daily for high blood pressure.       Spoke w/pt's wife, she states they were made aware of all med changes at d/c today, repeated them back for verification, pharmacy team has discussed Opsumit, pt will go to [facility] on [address] for labs, order faxed

## 2022-05-28 NOTE — Telephone Encounter (Signed)
Advanced Heart Failure Patient Advocate Encounter  Received message from [facility] that they were not able to get in contact with the patient regarding Opsumit refill. Called and spoke with the patient's wife. She did confirm that the patient has been taking Opsumit and she thinks its about time to refill the medication. Provided the phone number to [facility], [phone]. The patient's wife stated that she would call and request a refill when they got home and settled.   Archer Asa, CPhT

## 2022-05-28 NOTE — Telephone Encounter (Signed)
-----   Message from Laurey Morale, MD sent at 05/28/2022 12:56 PM EDT ----- Cath done today: 1. Needs to start Opsumit 10 mg daily if he is not already taking it (he is not sure).  Lauren, can you help with this.  2. Increase torsemide to 20 mg daily alternating with 40 mg daily.  3. Stop Entresto and spironolactone for now with hyperkalemia and creatinine elevated.  4. Make sure he has followup within the next 2 weeks.  5. Needs BMET on Thursday, please call to schedule (hyperkalemia).

## 2022-05-28 NOTE — Interval H&P Note (Signed)
History and Physical Interval Note:  05/28/2022 12:24 PM  Robert Baldwin  has presented today for surgery, with the diagnosis of hf.  The various methods of treatment have been discussed with the patient and family. After consideration of risks, benefits and other options for treatment, the patient has consented to  Procedure(s): RIGHT HEART CATH (N/A) as a surgical intervention.  The patient's history has been reviewed, patient examined, no change in status, stable for surgery.  I have reviewed the patient's chart and labs.  Questions were answered to the patient's satisfaction.     Norvella Loscalzo [employer]

## 2022-05-29 ENCOUNTER — Encounter (HOSPITAL_COMMUNITY): Payer: Self-pay | Admitting: Cardiology

## 2022-05-31 DIAGNOSIS — E875 Hyperkalemia: Secondary | ICD-10-CM | POA: Diagnosis not present

## 2022-06-01 ENCOUNTER — Other Ambulatory Visit (HOSPITAL_COMMUNITY): Payer: Self-pay | Admitting: Cardiology

## 2022-06-01 ENCOUNTER — Telehealth (HOSPITAL_COMMUNITY): Payer: Self-pay | Admitting: *Deleted

## 2022-06-01 NOTE — Telephone Encounter (Signed)
Received labs done 8/10 at [facility]:   K 4.6 BUN 60 Cr 1.50  Labs reviewed by Dr Shirlee Latch: stable, no changes at this time  Pt's wife aware and agreeable, pt sch for f/u 8/22

## 2022-06-04 ENCOUNTER — Other Ambulatory Visit (HOSPITAL_COMMUNITY): Payer: Self-pay

## 2022-06-05 ENCOUNTER — Other Ambulatory Visit (HOSPITAL_COMMUNITY): Payer: Medicare PPO

## 2022-06-05 ENCOUNTER — Other Ambulatory Visit (HOSPITAL_COMMUNITY): Payer: Self-pay | Admitting: Surgery

## 2022-06-05 ENCOUNTER — Telehealth (HOSPITAL_COMMUNITY): Payer: Self-pay | Admitting: Surgery

## 2022-06-05 ENCOUNTER — Other Ambulatory Visit: Payer: Self-pay

## 2022-06-05 ENCOUNTER — Ambulatory Visit: Payer: Medicare PPO | Admitting: Vascular Surgery

## 2022-06-05 ENCOUNTER — Encounter: Payer: Self-pay | Admitting: Vascular Surgery

## 2022-06-05 DIAGNOSIS — I6523 Occlusion and stenosis of bilateral carotid arteries: Secondary | ICD-10-CM | POA: Diagnosis not present

## 2022-06-05 DIAGNOSIS — R0683 Snoring: Secondary | ICD-10-CM

## 2022-06-05 DIAGNOSIS — I6529 Occlusion and stenosis of unspecified carotid artery: Secondary | ICD-10-CM | POA: Insufficient documentation

## 2022-06-05 MED ORDER — CLOPIDOGREL BISULFATE 75 MG PO TABS: 75.0000 mg | ORAL_TABLET | Freq: Every day | ORAL | 6 refills | Status: DC

## 2022-06-05 NOTE — Telephone Encounter (Signed)
Get in-house sleep study

## 2022-06-05 NOTE — Progress Notes (Signed)
Patient unable to complete ordered home sleep study.  Per physician order placed for in clinic sleep study.

## 2022-06-05 NOTE — Progress Notes (Signed)
Patient name: Robert Baldwin MRN: [id number] DOB: Dec 31, 1940 Sex: male  REASON FOR CONSULT: Discuss carotid intervention in the setting of pulmonary hypertension  HPI: Robert Baldwin is a 81 y.o. male, with history of atrial fibrillation on Coumadin, hypertension, hyperlipidemia, diabetes, congestive heart failure with RV failure and severe pulmonary hypertension that presents to discuss carotid intervention.  Patient was initially seen by my partner Dr. Arbie Cookey in [place] with asymptomatic bilateral carotid disease.  He was scheduled for left carotid endarterectomy.  Unfortunately he has evidence of severe pulmonary hypertension and underwent right heart cath last week and Dr. Shirlee Latch informed me that he was too high risk for general anesthesia.  He presents today to discuss options.  No new neurologic events.    Past Medical History:  Diagnosis Date   Atrial fibrillation (HCC)    Hyperlipidemia    Hypertension    Type 2 diabetes mellitus [facility]     Past Surgical History:  Procedure Laterality Date   Cataract surgery     HERNIA REPAIR     RIGHT HEART CATH N/A 05/28/2022   Procedure: RIGHT HEART CATH;  Surgeon: Laurey Morale, MD;  Location[facility] INVASIVE CV LAB;  Service: Cardiovascular;  Laterality: N/A;   RIGHT/LEFT HEART CATH AND CORONARY ANGIOGRAPHY N/A 04/30/2022   Procedure: RIGHT/LEFT HEART CATH AND CORONARY ANGIOGRAPHY;  Surgeon: Yvonne Kendall, MD;  Location: [facility] INVASIVE CV LAB;  Service: Cardiovascular;  Laterality: N/A;   YAG LASER APPLICATION Right 08/17/2016   Procedure: YAG LASER APPLICATION;  Surgeon: Susa Simmonds, MD;  Location: [facility] ORS;  Service: Ophthalmology;  Laterality: Right;    Family History  Problem Relation Age of Onset   Stroke Other    Heart disease Other     SOCIAL HISTORY: Social History   Socioeconomic History   Marital status: Married    Spouse name: Not on file   Number of children: Not on file   Years of education: Not on file   Highest  education level: Not on file  Occupational History   Occupation: Full time    Comment: Contractor  Tobacco Use   Smoking status: Never   Smokeless tobacco: Never   Tobacco comments:    Never smoke 05/18/22  Vaping Use   Vaping Use: Never used  Substance and Sexual Activity   Alcohol use: No    Alcohol/week: 0.0 standard drinks of alcohol   Drug use: No   Sexual activity: Not on file  Other Topics Concern   Not on file  Social History Narrative   Not on file   Social Determinants of Health   Financial Resource Strain: Not on file  Food Insecurity: Not on file  Transportation Needs: Not on file  Physical Activity: Not on file  Stress: Not on file  Social Connections: Not on file  Intimate Partner Violence: Not on file    Allergies  Allergen Reactions   Other Rash    Bandaids    Current Outpatient Medications  Medication Sig Dispense Refill   amLODipine (NORVASC) 5 MG tablet Take 1 tablet (5 mg total) by mouth daily. 30 tablet 11   atorvastatin (LIPITOR) 40 MG tablet Take 40 mg by mouth at bedtime.     cholecalciferol (VITAMIN D) 25 MCG (1000 UNIT) tablet Take 1,000 Units by mouth in the morning and at bedtime.     dapagliflozin propanediol (FARXIGA) 10 MG TABS tablet Take 1 tablet (10 mg total) by mouth daily. 30 tablet 5  fish oil-omega-3 fatty acids 1000 MG capsule Take 1 g by mouth every evening.     macitentan (OPSUMIT) 10 MG tablet Take 10 mg by mouth daily.     Misc Natural Products (GLUCOS-CHONDROIT-MSM COMPLEX) TABS Take 2 tablets by mouth daily.     Multiple Vitamins-Minerals (MULTIVITAMIN WITH MINERALS) tablet Take 1 tablet by mouth in the morning.  Centrum Silver     niacin 500 MG tablet Take 500 mg by mouth in the morning.     tadalafil, PAH, (ADCIRCA) 20 MG tablet Take 2 tablets (40 mg total) by mouth daily. 60 tablet 5   torsemide (DEMADEX) 20 MG tablet Take 1 tablet (20 mg total) by mouth every other day AND 2 tablets (40 mg total) every other day. 30  tablet 6   warfarin (COUMADIN) 5 MG tablet Take 5-7.5 mg by mouth See admin instructions. Managed by Dr. Margaretmary Eddy office Take 1.5 tablets (7.5 mg) by mouth in the evenings on Sundays & Thursdays. Take 1 tablet (5 mg) by mouth in the evening on Mondays, Tuesdays, Wednesdays, Fridays & Saturdays.     No current facility-administered medications for this visit.    REVIEW OF SYSTEMS:  [X]  denotes positive finding, [ ]  denotes negative finding Cardiac  Comments:  Chest pain or chest pressure:    Shortness of breath upon exertion:    Short of breath when lying flat:    Irregular heart rhythm:        Vascular    Pain in calf, thigh, or hip brought on by ambulation:    Pain in feet at night that wakes you up from your sleep:     Blood clot in your veins:    Leg swelling:         Pulmonary    Oxygen at home:    Productive cough:     Wheezing:         Neurologic    Sudden weakness in arms or legs:     Sudden numbness in arms or legs:     Sudden onset of difficulty speaking or slurred speech:    Temporary loss of vision in one eye:     Problems with dizziness:         Gastrointestinal    Blood in stool:     Vomited blood:         Genitourinary    Burning when urinating:     Blood in urine:        Psychiatric    Major depression:         Hematologic    Bleeding problems:    Problems with blood clotting too easily:        Skin    Rashes or ulcers:        Constitutional    Fever or chills:      PHYSICAL EXAM: Vitals:   06/05/22 1434 06/05/22 1442  BP: (!) 140/46 (!) 148/50  Pulse: (!) 41 (!) 41  Resp: 18   Temp: 97.8 F (36.6 C)   TempSrc: Temporal   SpO2: 94%   Weight: 185 lb (83.9 kg)   Height: 5\' 7"  (1.702 m)     GENERAL: The patient is a well-nourished male, in no acute distress. The vital signs are documented above. CARDIAC: There is a regular rate and rhythm.  VASCULAR:  No previous neck incisions PULMONARY: No respiratory distress. ABDOMEN: Soft and  non-tender MUSCULOSKELETAL: There are no major deformities or cyanosis. NEUROLOGIC: No focal weakness or  paresthesias are detected.  CN II-XII grossly intact. SKIN: There are no ulcers or rashes noted. PSYCHIATRIC: The patient has a normal affect.  DATA:   Reviewed CTA neck 04/12/2022 with evidence of bilateral high-grade ICA stenosis  Assessment/Plan:  81 year old male with history of atrial fibrillation on Coumadin, hypertension, hyperlipidemia, diabetes, congestive heart failure with RV failure and severe pulmonary hypertensionwho who was scheduled for left carotid endarterectomy with me for asymptomatic high-grade bilateral ICA stenosis after evaluation by Dr. Donnetta Hutching in [place].  Unfortunately he has CHF with RV failure and severe pulmonary hypertension.  He underwent right heart cath last week and Dr. Aundra Dubin and cardiology feels he is too high risk for general anesthesia.  I have subsequently discussed the option of medical management for his carotid artery disease versus an awake TCAR.  We discussed the risks and benefits of each.  He is amendable to proceed with TCAR after we had a long discussion.  I have asked that he start an 81 mg aspirin and have given him a prescription for Plavix to start before surgery.  He will also need to hold Coumadin for 5 days.  Risks benefits discussed including 1% perioperative stroke risk, bleeding, MI or worsening heart failure including cardiac arrest, etc.  He wishes to proceed.  We will schedule his surgery after he meets with Dr. Aundra Dubin next week.   Marty Heck, MD [facility] Office: [phone]

## 2022-06-05 NOTE — Telephone Encounter (Signed)
I called patient to inquire about patient's ordered home sleep study. He tells me that he attempted to complete the study and could not get the phone App to stay connected in order to successfully download information. I will forward this information to the provider.

## 2022-06-07 DIAGNOSIS — I4891 Unspecified atrial fibrillation: Secondary | ICD-10-CM | POA: Diagnosis not present

## 2022-06-07 DIAGNOSIS — Z6831 Body mass index (BMI) 31.0-31.9, adult: Secondary | ICD-10-CM | POA: Diagnosis not present

## 2022-06-07 DIAGNOSIS — E1165 Type 2 diabetes mellitus with hyperglycemia: Secondary | ICD-10-CM | POA: Diagnosis not present

## 2022-06-07 DIAGNOSIS — I10 Essential (primary) hypertension: Secondary | ICD-10-CM | POA: Diagnosis not present

## 2022-06-07 DIAGNOSIS — L0390 Cellulitis, unspecified: Secondary | ICD-10-CM | POA: Diagnosis not present

## 2022-06-07 DIAGNOSIS — Z299 Encounter for prophylactic measures, unspecified: Secondary | ICD-10-CM | POA: Diagnosis not present

## 2022-06-11 SURGERY — ENDARTERECTOMY, CAROTID
Anesthesia: General | Laterality: Left

## 2022-06-11 NOTE — Progress Notes (Signed)
Advanced Heart Failure Clinic Note   PCP: Kirstie Peri, MD Primary Cardiologist: Dr. Diona Browner HF Cardiologist: Dr. Shirlee Latch  HPI: Robert Baldwin is a 81 y.o. male with history of b/l carotid artery stenosis, DM II, HTN, HLD, permanent atrial fibrillation. He follows with Dr. Diona Browner in the cardiology clinic.    Saw [facility] for carotid artery stenosis 06/21. Was noting more dyspnea with exertion and lower extremity edema for about a month. He was subsequently started on po lasix 20 mg daily.   He was seen by Dr. Diona Browner in the clinic 04/26/22 for evaluation of recent symptoms and pre-operative evaluation in preparation for left CEA followed by staged right CEA. Echo day of the visit showed EF 70-75%, hyperdynamic LV function, mild LVH, interventricular septum flattened in systole and diastole consistent with RV pressure and volume overload, RV moderately enlarged and moderately reduced, RVSP 120 mmHg, moderate LAE, severe RAE, moderate pericardial effusion, severe TR, dilated IVC. Patient was scheduled for outpatient R/LHC to better assess hemodynamics and further characterize pulmonary hypertension.    [facility] 7/23 showed no significant CAD, RA mean 25, PA 115/30 (58), LVEDP 22 mmHg, PCWP not obtained, Fick CO/CI 6.4/3.1, PAPi 3.4. PVR 5.6 WU. 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 He was diuresed with IV lasix. V/Q scan negative for chronic PE. ANA+. All other serologic test -. HRCT - no definite ILD with probable mild pulmonary hypertension. Started on tadalafil. Diuresed 24 lbs. Hospitalization complicated by right groin hematoma and received 1 unit PRBCs. Discharged home, weight 179 lbs.  Repeat RHC 8/23 showed elevated PCWP and severe mixed pulmonary arterial/venous hypertension, but PA pressures significantly lower than prior RHC. He was instructed to increase torsemide to 40 mg daily alternating with20 mg every other day. [name] and  [name] held with hyperkalemia.  Today he returns for [facility] HF follow up with his wife. Overall feeling fine. Has LUE redness and swelling at site where he had IV for his RHC. Saw PCP and was placed on doxy. Pain is better but remains swollen. Has some ankle swelling. He does not have significant dyspnea with walking on flat ground with his walker when he takes his time. Wife thinks he is more SOB than he admits. Denies palpitations, abnormal bleeding, CP, dizziness, or PND/Orthopnea. Appetite ok. No fever or chills. Weight at home 178 pounds. Taking all medications. Wife says he snores. Does not want to wear home oxygen.  ReDs: 55%  ECG (personally reviewed): none ordered today.  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  PA pressure still high, but is significantly lower than prior RHC.   RA mean 8 RV 81/7 PA 85/40, mean 41 PCWP mean 22, prominent v waves to 29  Oxygen saturations: PA 69% AO 97%  Cardiac Output (Fick) 5.55  Cardiac Index (Fick) 2.86 PVR 3.4 WU  - R/LHC (7/23): no significant CAD, RA mean 25, PA 115/30 (58), LVEDP 22 mmHg, PCWP not obtained, Fick CO/CI 6.4/3.1, PAPi 3.4. PVR 5.6 WU.  - Echo (7/23): EF 70-75%, hyperdynamic LV function, mild LVH, interventricular septum flattened in systole and diastole consistent with RV pressure and volume overload, RV moderately enlarged and moderately reduced, RVSP 120 mmHg, moderate LAE, severe RAE, moderate pericardial effusion, severe TR, dilated IVC   Past Medical History:  Diagnosis Date  Atrial fibrillation (HCC)    Hyperlipidemia    Hypertension    Type 2 diabetes mellitus (HCC)    Current Outpatient Medications  Medication Sig Dispense Refill   amLODipine (NORVASC) 5 MG tablet Take 1 tablet (5 mg total) by mouth daily. 30 tablet 11    ASPIRIN 81 PO Take 81 mg by mouth at bedtime.     atorvastatin (LIPITOR) 40 MG tablet Take 40 mg by mouth at bedtime.     cholecalciferol (VITAMIN D) 25 MCG (1000 UNIT) tablet Take 1,000 Units by mouth in the morning and at bedtime.     clopidogrel (PLAVIX) 75 MG tablet Take 1 tablet (75 mg total) by mouth daily. 30 tablet 6   dapagliflozin propanediol (FARXIGA) 10 MG TABS tablet Take 1 tablet (10 mg total) by mouth daily. 30 tablet 5   DOXYCYCLINE HYCLATE PO Take 100 mg by mouth 2 (two) times daily.     fish oil-omega-3 fatty acids 1000 MG capsule Take 1 g by mouth every evening.     macitentan (OPSUMIT) 10 MG tablet Take 10 mg by mouth daily.     Misc Natural Products (GLUCOS-CHONDROIT-MSM COMPLEX) TABS Take 2 tablets by mouth daily.     Multiple Vitamins-Minerals (MULTIVITAMIN WITH MINERALS) tablet Take 1 tablet by mouth in the morning.  Centrum Silver     niacin 500 MG tablet Take 500 mg by mouth in the morning.     tadalafil, PAH, (ADCIRCA) 20 MG tablet Take 2 tablets (40 mg total) by mouth daily. 60 tablet 5   torsemide (DEMADEX) 20 MG tablet Take 1 tablet (20 mg total) by mouth every other day AND 2 tablets (40 mg total) every other day. 30 tablet 6   warfarin (COUMADIN) 5 MG tablet Take 5-7.5 mg by mouth See admin instructions. Managed by Dr. Trena Platt office Take 1.5 tablets (7.5 mg) by mouth in the evenings on Sundays & Thursdays. Take 1 tablet (5 mg) by mouth in the evening on Mondays, Tuesdays, Wednesdays, Fridays & Saturdays.     No current facility-administered medications for this encounter.   Allergies  Allergen Reactions   Other Rash    Bandaids   Social History   Socioeconomic History   Marital status: Married    Spouse name: Not on file   Number of children: Not on file   Years of education: Not on file   Highest education level: Not on file  Occupational History   Occupation: Full time    Comment: Contractor  Tobacco Use   Smoking status: Never   Smokeless  tobacco: Never   Tobacco comments:    Never smoke 05/18/22  Vaping Use   Vaping Use: Never used  Substance and Sexual Activity   Alcohol use: No    Alcohol/week: 0.0 standard drinks of alcohol   Drug use: No   Sexual activity: Not on file  Other Topics Concern   Not on file  Social History Narrative   Not on file   Social Determinants of Health   Financial Resource Strain: Not on file  Food Insecurity: Not on file  Transportation Needs: Not on file  Physical Activity: Not on file  Stress: Not on file  Social Connections: Not on file  Intimate Partner Violence: Not on file   Family History  Problem Relation Age of Onset   Stroke Other    Heart disease Other    BP (!) 160/62   Pulse (!) 57   Wt 82 kg (180 lb 12.8  oz)   SpO2 97%   BMI 28.32 kg/m   Wt Readings from Last 3 Encounters:  06/12/22 82 kg (180 lb 12.8 oz)  06/05/22 83.9 kg (185 lb)  05/28/22 82.1 kg (181 lb)   PHYSICAL EXAM: General:  NAD. No resp difficulty, walked into clinic with RW HEENT: Normal Neck: Supple. No JVD. Carotids 2+ bilat; no bruits. No lymphadenopathy or thryomegaly appreciated. Cor: PMI nondisplaced. Irregular rate & rhythm. No rubs, gallops or murmurs. Lungs: RUL faint crackle Abdomen: Soft, nontender, nondistended. No hepatosplenomegaly. No bruits or masses. Good bowel sounds. Extremities: No cyanosis, clubbing, rash, 2+ BLE pre-tibial edema Neuro: Alert & oriented x 3, cranial nerves grossly intact. Moves all 4 extremities w/o difficulty. Affect pleasant.  ASSESSMENT & PLAN: 1. 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 RHC/LHC 7/23 preserved cardiac output.  Diuresed 24 lbs. 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  PA pressure still high, but is significantly lower than prior RHC. Improved NYHA II, despite volume overload today. REDs 55% (? Accuracy, appears at least mildly volume overloaded but not at level of ReDs readings today). - Place compression hose. Given Rx today. - Increase torsemide to 40 mg daily. BMET/BNP today, repeat BMET in 7-10 days. - Restart Entresto 24/26 mg bid. - Continue Farxiga 10 mg daily (avoid Actos in future). - Off spiro with hyperkalemia. Plan to re-challenge if able. 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  He has never smoked and has no known lung disease.  He has been on warfarin for atrial fibrillation long-term. No known rheumatologic illness.  Not a drinker though he has some signs of cirrhosis by prior liver imaging.  - V/Q scan negative for chronic PE  - High resolution CT chest completed, no definite ILD with probable mild pulmonary hypertension.  - Sent serologic workup>>anti-SCL 70 neg, anti-centromere Ab neg, RF neg, ANA with reflex positive. 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 He has been referred to rheumatology for further w/u and has an appt scheduled in 12/23. 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  PA pressure still high, but is significantly lower than prior RHC.  - Diurese as above. - Continue Opsumit 10 mg daily. - He has refused home oxygen. Sats 92% on room air today. - Continue tadalafil 40 mg daily. - Arrange in-lab sleep study. 3. Atrial fibrillation: Permanent.   - Continue warfarin.   - Bradycardic, does not need nodal blocker.  4. DM2: Continue SGLT2i 5. Carotid stenosis: CTA neck in 6/23 showed high grade bilateral ICA stenoses. Asymptomatic. No bruits on exam. Risk for general anesthesia is significant for CEA with his pulmonary hypetension.  Now planning for awake TCAR with [name].  - He has been started on ASA and Plavix. - Continue statin.  - He is on warfarin for AF. 6. Phlebitis: Continues abx per PCP. Unlikely to have DVT with him being on warfarin, ASA and Plavix, but could consider u/s to rule out if does not resolve.  Follow up in 6 weeks with Dr. Shirlee Latch as scheduled.  Prince Rome, FNP-BC 06/12/22

## 2022-06-12 ENCOUNTER — Encounter (HOSPITAL_COMMUNITY): Payer: Self-pay

## 2022-06-12 ENCOUNTER — Ambulatory Visit (HOSPITAL_COMMUNITY)
Admission: RE | Admit: 2022-06-12 | Discharge: 2022-06-12 | Disposition: A | Discharge status: Home / Self Care | Payer: Medicare PPO | Source: Ambulatory Visit | Attending: Family Medicine | Admitting: Family Medicine

## 2022-06-12 VITALS — BP 160/62 | HR 57 | Wt 180.8 lb

## 2022-06-12 DIAGNOSIS — I5033 Acute on chronic diastolic (congestive) heart failure: Secondary | ICD-10-CM | POA: Diagnosis not present

## 2022-06-12 DIAGNOSIS — R001 Bradycardia, unspecified: Secondary | ICD-10-CM | POA: Insufficient documentation

## 2022-06-12 DIAGNOSIS — E119 Type 2 diabetes mellitus without complications: Secondary | ICD-10-CM | POA: Insufficient documentation

## 2022-06-12 DIAGNOSIS — I809 Phlebitis and thrombophlebitis of unspecified site: Secondary | ICD-10-CM

## 2022-06-12 DIAGNOSIS — Z7902 Long term (current) use of antithrombotics/antiplatelets: Secondary | ICD-10-CM | POA: Diagnosis not present

## 2022-06-12 DIAGNOSIS — I2721 Secondary pulmonary arterial hypertension: Secondary | ICD-10-CM | POA: Insufficient documentation

## 2022-06-12 DIAGNOSIS — I4821 Permanent atrial fibrillation: Secondary | ICD-10-CM

## 2022-06-12 DIAGNOSIS — Z7901 Long term (current) use of anticoagulants: Secondary | ICD-10-CM | POA: Insufficient documentation

## 2022-06-12 DIAGNOSIS — I272 Pulmonary hypertension, unspecified: Secondary | ICD-10-CM

## 2022-06-12 DIAGNOSIS — Z7984 Long term (current) use of oral hypoglycemic drugs: Secondary | ICD-10-CM | POA: Insufficient documentation

## 2022-06-12 DIAGNOSIS — I110 Hypertensive heart disease with heart failure: Secondary | ICD-10-CM | POA: Diagnosis not present

## 2022-06-12 DIAGNOSIS — I6523 Occlusion and stenosis of bilateral carotid arteries: Secondary | ICD-10-CM | POA: Diagnosis not present

## 2022-06-12 DIAGNOSIS — E1159 Type 2 diabetes mellitus with other circulatory complications: Secondary | ICD-10-CM | POA: Diagnosis not present

## 2022-06-12 DIAGNOSIS — Z7982 Long term (current) use of aspirin: Secondary | ICD-10-CM | POA: Insufficient documentation

## 2022-06-12 DIAGNOSIS — Z79899 Other long term (current) drug therapy: Secondary | ICD-10-CM | POA: Diagnosis not present

## 2022-06-12 LAB — BASIC METABOLIC PANEL
Anion gap: 12 (ref 5–15)
BUN: 34 mg/dL — ABNORMAL HIGH (ref 8–23)
CO2: 27 mmol/L (ref 22–32)
Calcium: 9.4 mg/dL (ref 8.9–10.3)
Chloride: 102 mmol/L (ref 98–111)
Creatinine, Ser: 1.55 mg/dL — ABNORMAL HIGH (ref 0.61–1.24)
GFR, Estimated: 45 mL/min — ABNORMAL LOW (ref >60)
Glucose, Bld: 137 mg/dL — ABNORMAL HIGH (ref 70–99)
Potassium: 4.2 mmol/L (ref 3.5–5.1)
Sodium: 141 mmol/L (ref 135–145)

## 2022-06-12 LAB — BRAIN NATRIURETIC PEPTIDE: B Natriuretic Peptide: 494.7 pg/mL — ABNORMAL HIGH (ref 0.0–100.0)

## 2022-06-12 MED ORDER — ENTRESTO 24-26 MG PO TABS: 1.0000 | ORAL_TABLET | Freq: Two times a day (BID) | ORAL | 11 refills | Status: DC

## 2022-06-12 MED ORDER — TORSEMIDE 20 MG PO TABS: 20.0000 mg | ORAL_TABLET | Freq: Every day | ORAL | 6 refills | Status: DC

## 2022-06-12 NOTE — Progress Notes (Signed)
ReDS Vest / Clip - 06/12/22 0900       ReDS Vest / Clip   Station Marker C    Ruler Value 28    ReDS Value Range High volume overload    ReDS Actual Value 55

## 2022-06-12 NOTE — Patient Instructions (Signed)
INCREASE Torsemide to 40 mg (2 tabs) daily RESTART Entresto 24/26 mg, one tab twice a day  Labs today We will only contact you if something comes back abnormal or we need to make some changes. Otherwise no news is good news!  Labs needed in 10-14 days  Please wear your compression hose daily, place them on as soon as you get up in the morning and remove before you go to bed at night.  Keep follow up as scheduled with Dr Shirlee Latch  Do the following things EVERYDAY: Weigh yourself in the morning before breakfast. Write it down and keep it in a log. Take your medicines as prescribed Eat low salt foods--Limit salt (sodium) to 2000 mg per day.  Stay as active as you can everyday Limit all fluids for the day to less than 2 liters  At the [facility], you and your health needs are our priority. As part of our continuing mission to provide you with exceptional heart care, we have created designated Provider Care Teams. These Care Teams include your primary Cardiologist (physician) and Advanced Practice Providers (APPs- Physician Assistants and Nurse Practitioners) who all work together to provide you with the care you need, when you need it.   You may see any of the following providers on your designated Care Team at your next follow up: Dr Arvilla Meres Dr Carron Curie, NP Robbie Lis, [place] [facility] [place], [place] Karle Plumber, PharmD   Please be sure to bring in all your medications bottles to every appointment.

## 2022-06-13 ENCOUNTER — Encounter: Payer: Self-pay | Admitting: Cardiology

## 2022-06-13 NOTE — Progress Notes (Unsigned)
Cardiology Office Note  Date: 06/14/2022   ID: Robert, Baldwin 07-08-1941, MRN [id number]  PCP:  Kirstie Peri, MD  Cardiologist:  [name], MD Electrophysiologist:  None   Chief Complaint  Patient presents with   Cardiac follow-up    History of Present Illness: Robert Baldwin is an 81 y.o. male last seen in July with subsequent hospitalization and recent follow-up in the advanced heart failure clinic, I reviewed the note.  He has HFpEF with predominant right-sided heart failure and severe mixed pulmonary arterial and venous hypertension.  Diuretics were increased at recent office encounter, compression hose ordered.  He was also started back on Entresto.  Sleep study is pending.  In terms of management of his carotid artery disease, he is felt to be high risk with general anesthesia in light of his current situation, therefore TCAR is planned with [name].  He is here today with his wife.  States that he does feel better overall.  Generally NYHA class II at this time.  Diuretics were just increased as well as readdition of Entresto, he reports good urine output.  Also on Opsumit but states that he does not have Adcirca as yet.  He remains on Coumadin with follow-up by PCP.  Past Medical History:  Diagnosis Date   Atrial fibrillation (HCC)    Carotid artery disease (HCC)    Hyperlipidemia    Hypertension    Pulmonary hypertension (HCC)    Type 2 diabetes mellitus (HCC)     Past Surgical History:  Procedure Laterality Date   Cataract surgery     HERNIA REPAIR     RIGHT HEART CATH N/A 05/28/2022   Procedure: RIGHT HEART CATH;  Surgeon: Laurey Morale, MD;  Location: [facility] INVASIVE CV LAB;  Service: Cardiovascular;  Laterality: N/A;   RIGHT/LEFT HEART CATH AND CORONARY ANGIOGRAPHY N/A 04/30/2022   Procedure: RIGHT/LEFT HEART CATH AND CORONARY ANGIOGRAPHY;  Surgeon: Yvonne Kendall, MD;  Location: [facility] INVASIVE CV LAB;  Service: Cardiovascular;  Laterality: N/A;   YAG LASER  APPLICATION Right 08/17/2016   Procedure: YAG LASER APPLICATION;  Surgeon: Susa Simmonds, MD;  Location: [facility] ORS;  Service: Ophthalmology;  Laterality: Right;    Current Outpatient Medications  Medication Sig Dispense Refill   amLODipine (NORVASC) 5 MG tablet Take 1 tablet (5 mg total) by mouth daily. 30 tablet 11   ASPIRIN 81 PO Take 81 mg by mouth at bedtime.     atorvastatin (LIPITOR) 40 MG tablet Take 40 mg by mouth at bedtime.     cholecalciferol (VITAMIN D) 25 MCG (1000 UNIT) tablet Take 1,000 Units by mouth in the morning and at bedtime.     clopidogrel (PLAVIX) 75 MG tablet Take 1 tablet (75 mg total) by mouth daily. 30 tablet 6   dapagliflozin propanediol (FARXIGA) 10 MG TABS tablet Take 1 tablet (10 mg total) by mouth daily. 30 tablet 5   DOXYCYCLINE HYCLATE PO Take 100 mg by mouth 2 (two) times daily.     fish oil-omega-3 fatty acids 1000 MG capsule Take 1 g by mouth every evening.     macitentan (OPSUMIT) 10 MG tablet Take 10 mg by mouth daily.     Misc Natural Products (GLUCOS-CHONDROIT-MSM COMPLEX) TABS Take 2 tablets by mouth daily.     Multiple Vitamins-Minerals (MULTIVITAMIN WITH MINERALS) tablet Take 1 tablet by mouth in the morning.  Centrum Silver     niacin 500 MG tablet Take 500 mg by mouth in the  morning.     sacubitril-valsartan (ENTRESTO) 24-26 MG Take 1 tablet by mouth 2 (two) times daily. 60 tablet 11   tadalafil, PAH, (ADCIRCA) 20 MG tablet Take 2 tablets (40 mg total) by mouth daily. 60 tablet 5   torsemide (DEMADEX) 20 MG tablet Take 40 mg by mouth every morning.     warfarin (COUMADIN) 5 MG tablet Take 5-7.5 mg by mouth See admin instructions. Managed by Dr. Margaretmary Eddy office Take 1.5 tablets (7.5 mg) by mouth in the evenings on Sundays & Thursdays. Take 1 tablet (5 mg) by mouth in the evening on Mondays, Tuesdays, Wednesdays, Fridays & Saturdays.     No current facility-administered medications for this visit.   Allergies:  Other   Social History: The  patient  reports that he has never smoked. He has never used smokeless tobacco. He reports that he does not drink alcohol and does not use drugs.   Family History: The patient's family history includes Heart disease in an other family member; Stroke in an other family member.   ROS: No palpitations or unexplained syncope.  Physical Exam: VS:  BP 132/70   Pulse (!) 51   Ht 5\' 7"  (1.702 m)   Wt 182 lb 9.6 oz (82.8 kg)   SpO2 99%   BMI 28.60 kg/m , BMI Body mass index is 28.6 kg/m.  Wt Readings from Last 3 Encounters:  06/14/22 182 lb 9.6 oz (82.8 kg)  06/12/22 180 lb 12.8 oz (82 kg)  06/05/22 185 lb (83.9 kg)    General: Patient appears comfortable at rest. HEENT: Conjunctiva and lids normal. Neck: Supple, no elevated JVP or carotid bruits. Lungs: Decreased breath sounds without wheezing, nonlabored breathing at rest. Cardiac: Irregular, no S3, 1/6 systolic murmur, no pericardial rub. Abdomen: Soft, bowel sounds present. Extremities: 2+ edema, wearing compression stockings. Skin: Warm and dry. Musculoskeletal: No kyphosis. Neuropsychiatric: Alert and oriented x3, affect grossly appropriate.  ECG:  An ECG dated 05/18/2022 was personally reviewed today and demonstrated:  Atrial fibrillation with slow ventricular response and right bundle branch block.  Recent Labwork: 04/30/2022: ALT 17; AST 35 05/08/2022: Magnesium 2.3 05/18/2022: Platelets 270 05/28/2022: Hemoglobin 11.2; Hemoglobin 11.2 06/12/2022: B Natriuretic Peptide 494.7; BUN 34; Creatinine, Ser 1.55; Potassium 4.2; Sodium 141   Other Studies Reviewed Today:  Echocardiogram 04/26/2022:  1. Left ventricular ejection fraction, by estimation, is 70 to 75%. The  left ventricle has hyperdynamic function. The left ventricle has no  regional wall motion abnormalities. There is mild left ventricular  hypertrophy. Left ventricular diastolic  parameters are indeterminate. There is the interventricular septum is  flattened in systole  and diastole, consistent with right ventricular  pressure and volume overload. The average left ventricular global  longitudinal strain is -24.0 %. The global  longitudinal strain is normal.   2. Right ventricular systolic function is moderately reduced. The right  ventricular size is moderately enlarged. Mildly increased right  ventricular wall thickness. There is severely elevated pulmonary artery  systolic pressure. The estimated right  ventricular systolic pressure is 120.3 mmHg.   3. Left atrial size was moderately dilated.   4. Right atrial size was severely dilated.   5. Right pleural effusion noted. Moderate pericardial effusion. The  pericardial effusion is posterior to the left ventricle and localized near  the right atrium. Small collection anterolaterally.   6. The mitral valve is abnormal. Trivial mitral valve regurgitation.  Moderate mitral annular calcification.   7. Tricuspid valve regurgitation is severe.   8. The  aortic valve is tricuspid. Aortic valve regurgitation is not  visualized. Aortic valve sclerosis/calcification is present, without any  evidence of aortic stenosis. Aortic valve mean gradient measures 6.7 mmHg.   9. The inferior vena cava is dilated in size with <50% respiratory  variability, suggesting right atrial pressure of 15 mmHg.   Cardiac catheterization 04/30/2022: No angiographically significant coronary artery disease. Severely elevated pulmonary artery and right heart filling pressures (mean PA 58 mmHg, mean RA 25 mmHg). Mildly elevated left heart filling pressure (LVEDP 22 mmHg). Normal Fick cardiac output/index (CO 6.4 L/min, CI 3.1 L/min/m). Small, heavily calcified right radial artery precluding catheter advancement from this approach.  Recommend alternative access if future catheterizations are needed.  Right heart catheterization 05/28/2022: RA mean 8 RV 81/7 PA 85/40, mean 41 PCWP mean 22, prominent v waves to 29  Oxygen saturations: PA  69% AO 97%  Cardiac Output (Fick) 5.55  Cardiac Index (Fick) 2.86 PVR 3.4 WU  1. PCWP remains elevated.  2. 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  PA pressure remains high, but is significantly lower than on prior RHC.   Assessment and Plan:  1.  HFpEF with prominent RV dysfunction.  Just recently started back on Entresto with increase in Demadex to 40 mg daily.  Also on Farxiga.  I reviewed his recent lab work, he is presently not on MRA due to hyperkalemia.  No changes made today given recent dose adjustments.  Continue to track weight.  2.  Pulmonary hypertension, mixed pulmonary venous/arterial with work-up per the advanced heart failure team as reviewed above.  Sleep study is pending.  He is on Opsumit at this time, does not have tadalafil as yet.  Continue diuresis as tolerated.  3.  Permanent atrial fibrillation with CHA2DS2-VASc score of 5.  He remains on Coumadin for stroke prophylaxis.  Also has underlying conduction system disease and not on AV nodal blockers.  4.  Critical bilateral ICA stenosis.  Plan is for TCAR by [name].  He would be high risk for surgery under general anesthesia due to pulmonary hypertension.  Medication Adjustments/Labs and Tests Ordered: Current medicines are reviewed at length with the patient today.  Concerns regarding medicines are outlined above.   Tests Ordered: No orders of the defined types were placed in this encounter.   Medication Changes: No orders of the defined types were placed in this encounter.   Disposition:  Follow up  3 months.  Signed, Jonelle Sidle, MD, [facility] 06/14/2022 10:16 AM    [facility] [place] [address] [place], [place], [place] [address] Phone: [phone]; Fax: [phone]

## 2022-06-14 ENCOUNTER — Ambulatory Visit: Payer: Medicare PPO | Admitting: Cardiology

## 2022-06-14 ENCOUNTER — Encounter: Payer: Self-pay | Admitting: Cardiology

## 2022-06-14 VITALS — BP 132/70 | HR 51 | Ht 67.0 in | Wt 182.6 lb

## 2022-06-14 DIAGNOSIS — I6523 Occlusion and stenosis of bilateral carotid arteries: Secondary | ICD-10-CM | POA: Diagnosis not present

## 2022-06-14 DIAGNOSIS — I272 Pulmonary hypertension, unspecified: Secondary | ICD-10-CM

## 2022-06-14 DIAGNOSIS — I4821 Permanent atrial fibrillation: Secondary | ICD-10-CM

## 2022-06-14 NOTE — Patient Instructions (Signed)

## 2022-06-15 DIAGNOSIS — Z6831 Body mass index (BMI) 31.0-31.9, adult: Secondary | ICD-10-CM | POA: Diagnosis not present

## 2022-06-15 DIAGNOSIS — I4891 Unspecified atrial fibrillation: Secondary | ICD-10-CM | POA: Diagnosis not present

## 2022-06-15 DIAGNOSIS — Z299 Encounter for prophylactic measures, unspecified: Secondary | ICD-10-CM | POA: Diagnosis not present

## 2022-06-15 DIAGNOSIS — Z713 Dietary counseling and surveillance: Secondary | ICD-10-CM | POA: Diagnosis not present

## 2022-06-15 DIAGNOSIS — I10 Essential (primary) hypertension: Secondary | ICD-10-CM | POA: Diagnosis not present

## 2022-06-22 DIAGNOSIS — I4891 Unspecified atrial fibrillation: Secondary | ICD-10-CM | POA: Diagnosis not present

## 2022-06-26 NOTE — Pre-Procedure Instructions (Signed)
Surgical Instructions    Your procedure is scheduled on Monday 07/02/22.   Report to [facility] Main Entrance "A" at 07:30 A.M., then check in with the Admitting office.  Call this number if you have problems the morning of surgery:  [phone]   If you have any questions prior to your surgery date call [phone]: Open Monday-Friday 8am-4pm    Remember:  Do not eat or drink after midnight the night before your surgery    Take these medicines the morning of surgery with A SIP OF WATER:   amLODipine (NORVASC)  macitentan (OPSUMIT)   Please follow your surgeon's instructions regarding Aspirin, warfarin (COUMADIN), and Plavix. If you have not received instructions then please contact your surgeon's office for instructions.   As of today, STOP taking Aleve, Naproxen, Ibuprofen, Motrin, Advil, Goody's, BC's, all herbal medications, fish oil, and all vitamins.  WHAT DO I DO ABOUT MY DIABETES MEDICATION?   Do not take oral diabetes medicines (pills) the morning of surgery.  DO NOT TAKE dapagliflozin propanediol (FARXIGA) three days prior to your surgery. Last dose on 06/29/22.    The day of surgery, do not take other diabetes injectables, including Byetta (exenatide), Bydureon (exenatide ER), Victoza (liraglutide), or Trulicity (dulaglutide).  HOW TO MANAGE YOUR DIABETES BEFORE AND AFTER SURGERY  Why is it important to control my blood sugar before and after surgery? Improving blood sugar levels before and after surgery helps healing and can limit problems. A way of improving blood sugar control is eating a healthy diet by:  Eating less sugar and carbohydrates  Increasing activity/exercise  Talking with your doctor about reaching your blood sugar goals High blood sugars (greater than 180 mg/dL) can raise your risk of infections and slow your recovery, so you will need to focus on controlling your diabetes during the weeks before surgery. Make sure that the doctor who takes care  of your diabetes knows about your planned surgery including the date and location.  How do I manage my blood sugar before surgery? Check your blood sugar at least 4 times a day, starting 2 days before surgery, to make sure that the level is not too high or low.  Check your blood sugar the morning of your surgery when you wake up and every 2 hours until you get to the Short Stay unit.  If your blood sugar is less than 70 mg/dL, you will need to treat for low blood sugar: Do not take insulin. Treat a low blood sugar (less than 70 mg/dL) with  cup of clear juice (cranberry or apple), 4 glucose tablets, OR glucose gel. Recheck blood sugar in 15 minutes after treatment (to make sure it is greater than 70 mg/dL). If your blood sugar is not greater than 70 mg/dL on recheck, call [phone] for further instructions. Report your blood sugar to the short stay nurse when you get to Short Stay.  If you are admitted to the hospital after surgery: Your blood sugar will be checked by the staff and you will probably be given insulin after surgery (instead of oral diabetes medicines) to make sure you have good blood sugar levels. The goal for blood sugar control after surgery is 80-180 mg/dL.            Do not wear jewelry or makeup. Do not wear lotions, powders, perfumes/cologne or deodorant. Do not shave 48 hours prior to surgery.  Men may shave face and neck. Do not bring valuables to the hospital. Do not wear  nail polish, gel polish, artificial nails, or any other type of covering on natural nails (fingers and toes) If you have artificial nails or gel coating that need to be removed by a nail salon, please have this removed prior to surgery. Artificial nails or gel coating may interfere with anesthesia's ability to adequately monitor your vital signs.  [facility] is not responsible for any belongings or valuables.    Do NOT Smoke (Tobacco/Vaping)  24 hours prior to your procedure  If you use a  CPAP at night, you may bring your mask for your overnight stay.   Contacts, glasses, hearing aids, dentures or partials may not be worn into surgery, please bring cases for these belongings   For patients admitted to the hospital, discharge time will be determined by your treatment team.   Patients discharged the day of surgery will not be allowed to drive home, and someone needs to stay with them for 24 hours.   SURGICAL WAITING ROOM VISITATION Patients having surgery or a procedure may have no more than 2 support people in the waiting area - these visitors may rotate.   Children under the age of 61 must have an adult with them who is not the patient. If the patient needs to stay at the hospital during part of their recovery, the visitor guidelines for inpatient rooms apply. Pre-op nurse will coordinate an appropriate time for 1 support person to accompany patient in pre-op.  This support person may not rotate.   Please refer to the [facility] website for the visitor guidelines for Inpatients (after your surgery is over and you are in a regular room).    Special instructions:    Oral Hygiene is also important to reduce your risk of infection.  Remember - BRUSH YOUR TEETH THE MORNING OF SURGERY WITH YOUR REGULAR TOOTHPASTE   - Preparing For Surgery  Before surgery, you can play an important role. Because skin is not sterile, your skin needs to be as free of germs as possible. You can reduce the number of germs on your skin by washing with CHG (chlorahexidine gluconate) Soap before surgery.  CHG is an antiseptic cleaner which kills germs and bonds with the skin to continue killing germs even after washing.     Please do not use if you have an allergy to CHG or antibacterial soaps. If your skin becomes reddened/irritated stop using the CHG.  Do not shave (including legs and underarms) for at least 48 hours prior to first CHG shower. It is OK to shave your face.  Please follow  these instructions carefully.     Shower the NIGHT BEFORE SURGERY and the MORNING OF SURGERY with CHG Soap.   If you chose to wash your hair, wash your hair first as usual with your normal shampoo. After you shampoo, rinse your hair and body thoroughly to remove the shampoo.  Then [employer] and genitals (private parts) with your normal soap and rinse thoroughly to remove soap.  After that Use CHG Soap as you would any other liquid soap. You can apply CHG directly to the skin and wash gently with a scrungie or a clean washcloth.   Apply the CHG Soap to your body ONLY FROM THE NECK DOWN.  Do not use on open wounds or open sores. Avoid contact with your eyes, ears, mouth and genitals (private parts). Wash Face and genitals (private parts)  with your normal soap.   Wash thoroughly, paying special attention to the area  where your surgery will be performed.  Thoroughly rinse your body with warm water from the neck down.  DO NOT shower/wash with your normal soap after using and rinsing off the CHG Soap.  Pat yourself dry with a CLEAN TOWEL.  Wear CLEAN PAJAMAS to bed the night before surgery  Place CLEAN SHEETS on your bed the night before your surgery  DO NOT SLEEP WITH PETS.   Day of Surgery:  Take a shower with CHG soap. Wear Clean/Comfortable clothing the morning of surgery Do not apply any deodorants/lotions.   Remember to brush your teeth WITH YOUR REGULAR TOOTHPASTE.    If you received a COVID test during your pre-op visit, it is requested that you wear a mask when out in public, stay away from anyone that may not be feeling well, and notify your surgeon if you develop symptoms. If you have been in contact with anyone that has tested positive in the last 10 days, please notify your surgeon.    Please read over the following fact sheets that you were given.

## 2022-06-27 ENCOUNTER — Encounter (HOSPITAL_COMMUNITY): Payer: Self-pay

## 2022-06-27 ENCOUNTER — Other Ambulatory Visit: Payer: Self-pay

## 2022-06-27 ENCOUNTER — Encounter (HOSPITAL_COMMUNITY)
Admission: RE | Admit: 2022-06-27 | Discharge: 2022-06-27 | Disposition: A | Discharge status: Home / Self Care | Payer: Medicare PPO | Source: Ambulatory Visit | Attending: Vascular Surgery | Admitting: Vascular Surgery

## 2022-06-27 VITALS — BP 142/42 | HR 51 | Temp 98.3°F | Resp 18 | Ht 67.0 in | Wt 187.3 lb

## 2022-06-27 DIAGNOSIS — I4510 Unspecified right bundle-branch block: Secondary | ICD-10-CM | POA: Diagnosis not present

## 2022-06-27 DIAGNOSIS — I2510 Atherosclerotic heart disease of native coronary artery without angina pectoris: Secondary | ICD-10-CM | POA: Diagnosis not present

## 2022-06-27 DIAGNOSIS — E785 Hyperlipidemia, unspecified: Secondary | ICD-10-CM | POA: Insufficient documentation

## 2022-06-27 DIAGNOSIS — Z7901 Long term (current) use of anticoagulants: Secondary | ICD-10-CM | POA: Diagnosis not present

## 2022-06-27 DIAGNOSIS — I6523 Occlusion and stenosis of bilateral carotid arteries: Secondary | ICD-10-CM | POA: Diagnosis not present

## 2022-06-27 DIAGNOSIS — I700 Atherosclerosis of aorta: Secondary | ICD-10-CM | POA: Diagnosis not present

## 2022-06-27 DIAGNOSIS — I110 Hypertensive heart disease with heart failure: Secondary | ICD-10-CM | POA: Insufficient documentation

## 2022-06-27 DIAGNOSIS — Z79899 Other long term (current) drug therapy: Secondary | ICD-10-CM | POA: Insufficient documentation

## 2022-06-27 DIAGNOSIS — I081 Rheumatic disorders of both mitral and tricuspid valves: Secondary | ICD-10-CM | POA: Insufficient documentation

## 2022-06-27 DIAGNOSIS — Z01812 Encounter for preprocedural laboratory examination: Secondary | ICD-10-CM | POA: Diagnosis not present

## 2022-06-27 DIAGNOSIS — Z7984 Long term (current) use of oral hypoglycemic drugs: Secondary | ICD-10-CM | POA: Insufficient documentation

## 2022-06-27 DIAGNOSIS — E119 Type 2 diabetes mellitus without complications: Secondary | ICD-10-CM | POA: Insufficient documentation

## 2022-06-27 DIAGNOSIS — Z01818 Encounter for other preprocedural examination: Secondary | ICD-10-CM

## 2022-06-27 DIAGNOSIS — I5032 Chronic diastolic (congestive) heart failure: Secondary | ICD-10-CM | POA: Insufficient documentation

## 2022-06-27 DIAGNOSIS — I2721 Secondary pulmonary arterial hypertension: Secondary | ICD-10-CM | POA: Diagnosis not present

## 2022-06-27 DIAGNOSIS — I3139 Other pericardial effusion (noninflammatory): Secondary | ICD-10-CM | POA: Insufficient documentation

## 2022-06-27 DIAGNOSIS — I482 Chronic atrial fibrillation, unspecified: Secondary | ICD-10-CM | POA: Diagnosis not present

## 2022-06-27 HISTORY — DX: Cardiac arrhythmia, unspecified: I49.9

## 2022-06-27 HISTORY — DX: Unspecified osteoarthritis, unspecified site: M19.90

## 2022-06-27 LAB — SURGICAL PCR SCREEN
MRSA, PCR: NEGATIVE
Staphylococcus aureus: NEGATIVE

## 2022-06-27 LAB — CBC
HCT: 34.1 % — ABNORMAL LOW (ref 39.0–52.0)
Hemoglobin: 10.7 g/dL — ABNORMAL LOW (ref 13.0–17.0)
MCH: 31 pg (ref 26.0–34.0)
MCHC: 31.4 g/dL (ref 30.0–36.0)
MCV: 98.8 fL (ref 80.0–100.0)
Platelets: 208 10*3/uL (ref 150–400)
RBC: 3.45 MIL/uL — ABNORMAL LOW (ref 4.22–5.81)
RDW: 16.7 % — ABNORMAL HIGH (ref 11.5–15.5)
WBC: 8.3 10*3/uL (ref 4.0–10.5)
nRBC: 0 % (ref 0.0–0.2)

## 2022-06-27 LAB — COMPREHENSIVE METABOLIC PANEL
ALT: 20 U/L (ref 0–44)
AST: 28 U/L (ref 15–41)
Albumin: 3.6 g/dL (ref 3.5–5.0)
Alkaline Phosphatase: 83 U/L (ref 38–126)
Anion gap: 13 (ref 5–15)
BUN: 47 mg/dL — ABNORMAL HIGH (ref 8–23)
CO2: 30 mmol/L (ref 22–32)
Calcium: 9.4 mg/dL (ref 8.9–10.3)
Chloride: 97 mmol/L — ABNORMAL LOW (ref 98–111)
Creatinine, Ser: 1.77 mg/dL — ABNORMAL HIGH (ref 0.61–1.24)
GFR, Estimated: 38 mL/min — ABNORMAL LOW (ref >60)
Glucose, Bld: 149 mg/dL — ABNORMAL HIGH (ref 70–99)
Potassium: 4.2 mmol/L (ref 3.5–5.1)
Sodium: 140 mmol/L (ref 135–145)
Total Bilirubin: 0.8 mg/dL (ref 0.3–1.2)
Total Protein: 7 g/dL (ref 6.5–8.1)

## 2022-06-27 LAB — URINALYSIS, ROUTINE W REFLEX MICROSCOPIC
Bacteria, UA: NONE SEEN
Bilirubin Urine: NEGATIVE
Glucose, UA: >=500 mg/dL — AB
Hgb urine dipstick: NEGATIVE
Ketones, ur: NEGATIVE mg/dL
Leukocytes,Ua: NEGATIVE
Nitrite: NEGATIVE
Protein, ur: NEGATIVE mg/dL
Specific Gravity, Urine: 1.007 (ref 1.005–1.030)
pH: 5 (ref 5.0–8.0)

## 2022-06-27 LAB — GLUCOSE, CAPILLARY: Glucose-Capillary: 158 mg/dL — ABNORMAL HIGH (ref 70–99)

## 2022-06-27 LAB — APTT: aPTT: 37 seconds — ABNORMAL HIGH (ref 24–36)

## 2022-06-27 LAB — PROTIME-INR
INR: 1.8 — ABNORMAL HIGH (ref 0.8–1.2)
Prothrombin Time: 20.9 seconds — ABNORMAL HIGH (ref 11.4–15.2)

## 2022-06-27 NOTE — Progress Notes (Signed)
Received a call from the blood bank stating that another Type and Screen needs to be drawn on the day of surgery. Can use current blue bracelet in chart. Order placed for T&S on the day of surgery.

## 2022-06-27 NOTE — Progress Notes (Addendum)
PCP - Dr. Clelia Croft Cardiologist - Dr. Diona Browner  PPM/ICD - n/a Device Orders - n/a Rep Notified - n/a  Chest x-ray - 05/01/22 EKG - 05/18/22 Stress Test - 08/2017 ECHO - 04/26/22 Cardiac Cath - 05/28/22  Sleep Study - denies CPAP - n/a  CBG today- 158. Had an egg/tomato sandwich, coffee, and orange juice for breakfast.  Checks blood sugar once a day Range- 140-180 A1 C 5.8 on 04/30/22  Blood Thinner Instructions: Patient's wife states they were instructed to take last dose of Coumadin on 9/6 and to continue Aspirin and Plavix.    ERAS Protcol - NPO  COVID TEST- n/a   Anesthesia review: Yes. Cardiac History BP 142/42 and pulse 51 at PAT visit. Patient states this his normal. Hetty Ely, PA with anesthesia made aware  Patient denies shortness of breath, fever, cough and chest pain at PAT appointment   All instructions explained to the patient, with a verbal understanding of the material. Patient agrees to go over the instructions while at home for a better understanding. The opportunity to ask questions was provided.

## 2022-06-28 NOTE — Anesthesia Preprocedure Evaluation (Signed)
Anesthesia Evaluation  Patient identified by MRN, date of birth, ID band Patient awake    Reviewed: Allergy & Precautions, H&P , NPO status , Patient's Chart, lab work & pertinent test results  Airway Mallampati: III   Neck ROM: full    Dental   Pulmonary shortness of breath,    breath sounds clear to auscultation       Cardiovascular hypertension, + dysrhythmias Atrial Fibrillation  Rhythm:irregular Rate:Normal  RHC 05/28/22: RHC Procedural Findings: Hemodynamics (mmHg) RA mean 8 RV 81/7 PA 85/40, mean 41 PCWP mean 22, prominent v waves to 29  Oxygen saturations: PA 69% AO 97%  Cardiac Output (Fick) 5.55  Cardiac Index (Fick) 2.86 PVR 3.4 WU  1. PCWP remains elevated.  2. 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 PA pressure remains high, but is significantly lower than on prior RHC.     Neuro/Psych    GI/Hepatic   Endo/Other  diabetes, Type 2  Renal/GU Renal InsufficiencyRenal disease     Musculoskeletal  (+) Arthritis ,   Abdominal   Peds  Hematology  (+) Blood dyscrasia, anemia ,   Anesthesia Other Findings   Reproductive/Obstetrics                            Anesthesia Physical Anesthesia Plan  ASA: 3  Anesthesia Plan: MAC   Post-op Pain Management:    Induction: Intravenous  PONV Risk Score and Plan: 1 and Ondansetron, Dexamethasone and Treatment may vary due to age or medical condition  Airway Management Planned: Simple Face Mask  Additional Equipment:   Intra-op Plan:   Post-operative Plan:   Informed Consent: I have reviewed the patients History and Physical, chart, labs and discussed the procedure including the risks, benefits and alternatives for the proposed anesthesia with the patient or authorized representative who has indicated his/her understanding and acceptance.     Dental advisory given  Plan Discussed with: CRNA,  Anesthesiologist and Surgeon  Anesthesia Plan Comments: (See PAT note written 06/28/2022 by Myra Gianotti, PA-C. )       Anesthesia Quick Evaluation

## 2022-06-28 NOTE — Progress Notes (Addendum)
Anesthesia Chart Review:  Case: [id number] Date/Time: 07/02/22 0915   Procedure: Transcarotid Artery Revascularization (Left)   Anesthesia type: Monitor Anesthesia Care   Pre-op diagnosis: Bilateral carotid artery disease   Location: [facility] OR ROOM 57 / [place] [facility]   Surgeons: Marty Heck, MD       DISCUSSION: Patient is an 81 year old male scheduled for the above procedure.   In late June 2023, he was being considered for staged carotid endarterectomies but referred back to cardiology due to increased dyspnea. Echo showed vigorous LVEF at 70 to 75% with LV septal flattening consistent with both RV pressure and volume overload, moderately dilated right ventricle with reduced contraction and severe pulmonary hypertension, estimated RVSP 120 mmHg, severe tricuspid regurgitation, pericardial effusion that is small to moderate, and sclerotic to mildly stenotic aortic valve. Invasive cardiac evaluation recommended.   RHC/LHC done on 04/30/22 showed no significant CAD, but RA mean 25, PA 115/30 (58), LVEDP 22 mmHg, PCWP not obtained, Fick CO/CI 6.4/3.1, PAPi 3.4. PVR 5.6 WU. 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 Procedure complicated by right groin hematoma requiring brief hold on anticoagulation therapy. He received Feraheme and 1 unit PRBC. 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 Discharge medications included Farxiga, spironolactone, Tadalafil, Torsemide, warfarin. (He did not tolerate Entresto at first due to hypotension but added later. Not on AV nodal blocking agents due to bradycardia.)  Repeat RHC on 05/28/22 showed high, but significantly lower PA pressures with RA mean 8, PA 85/40 (41). Torsemide increased  and started on Opsumit. Spironolactone held for hyperkalemia. Dr. Aundra Dubin spoke with Dr. Carlis Abbott and felt Robert Baldwin was too high risk for general anesthesia, so TCAR planned under MAC. Pulmonary hypertension medications include torsemide 40 mg daily, Entresto 24-26 mg BID, tadalafil 40 mg daily, Opsumit 10 mg daily, Farxiga 10 mg daily, amlodipine (for HTN).   History includes never smoker, HTN, HLD, atrial fibrillation, chronic diastolic CHF, pulmonary hypertension, DM2, carotid artery stenosis (bilateral).  Last HR evaluation on 06/12/22. Last visit with cardiologist Dr. Domenic Polite was on 06/14/22. 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 Diuretics increased and started back on Entresto. Also on Opsumit. He is not on AV nodal blockers due to underlying conduction system disease. Sleep study pending. He noted, "In terms of management of his carotid artery disease, he is felt to be high risk with general anesthesia in light of his current situation, therefore TCAR is planned with Dr. Carlis Abbott... He would be high risk for surgery under general anesthesia due to pulmonary hypertension."  Patient reported instructions to continue ASA and Plavix and hold warfarin for 5 days prior to surgery. He is on warfarin for CVA prophylaxis.  Case reviewed with anesthesiologists Dr. Gloris Manchester and Dr. Valma Cava. Preference is for anesthesiologist to discuss potential anesthesia options with Dr. Carlis Abbott prior to the day of surgery. Depending on input, anesthesiologist may also reach back out to cardiology if felt indicated. I will reach out to Dr. Carlis Abbott.  ADDENDUM 06/29/22 9:32 AM: Dr. Carlis Abbott was able to discuss anesthesia options for procedure with anesthesiologist Dr. Gifford Shave this morning. Reportedly Dr. Carlis Abbott is requesting light sedation from anesthesia, and he will do the local at the incision.  Plan discussed between Dr. Gloris Manchester and Dr. Gifford Shave. Anesthesia  team to evaluation on the day of surgery. Robert Baldwin is for T&S and repeat PT/INR on arrival.    VS: BP (!) 142/42   Pulse (!) 51   Temp 36.8 C (Oral)   Resp 18   Ht _0  (1.702 m)   Wt 85 kg   SpO2 94%   BMI 29.34 kg/m    PROVIDERS: Monico Blitz, MD is PCP Rozann Lesches, MD is cardiologist Loralie Champagne, MD is HF cardiologist   LABS: Preoperative labs noted. A1c 5.8% on 04/30/22.  (all labs ordered are listed, but only abnormal results are displayed)  Labs Reviewed  GLUCOSE, CAPILLARY - Abnormal; Notable for the following components:      Result Value   Glucose-Capillary 158 (*)    All other components within normal limits  CBC - Abnormal; Notable for the following components:   RBC 3.45 (*)    Hemoglobin 10.7 (*)    HCT 34.1 (*)    RDW 16.7 (*)    All other components within normal limits  COMPREHENSIVE METABOLIC PANEL - Abnormal; Notable for the following components:   Chloride 97 (*)    Glucose, Bld 149 (*)    BUN 47 (*)    Creatinine, Ser 1.77 (*)    GFR, Estimated 38 (*)    All other components within normal limits  PROTIME-INR - Abnormal; Notable for the following components:   Prothrombin Time 20.9 (*)    INR 1.8 (*)    All other components within normal limits  APTT - Abnormal; Notable for the following components:   aPTT 37 (*)    All other components within normal limits  URINALYSIS, ROUTINE W REFLEX MICROSCOPIC - Abnormal; Notable for the following components:   Glucose, UA >=500 (*)    All other components within normal limits  SURGICAL PCR SCREEN     IMAGES: CT Chest (high resolution) 05/03/22: IMPRESSION: 1. Overall, the appearance of the chest is most indicative of congestive heart failure, as above. 2. The possibility of interstitial lung disease is not excluded, but not strongly favored at this time. If there is persistent clinical concern for interstitial lung disease, repeat high-resolution chest CT could be considered in 12 months  to assess for temporal changes in the appearance of the lung parenchyma. 3. Dilatation of the pulmonic trunk (4.4 cm in diameter), compatible with reported clinical history of pulmonary arterial hypertension. 4. Small amount of pericardial fluid and/or thickening, unlikely to be of hemodynamic significance at this time. 5. Aortic atherosclerosis, in addition to three-vessel coronary artery disease. 6. There are calcifications of the mitral annulus. Echocardiographic correlation for evaluation of potential valvular dysfunction may be warranted if clinically indicated. - Aortic Atherosclerosis (ICD10-I70.0).     VQ Scan 05/01/22: IMPRESSION: Pulmonary embolism absent.   CTA Neck 04/12/22: IMPRESSION: 1. High-grade/Critical stenosis of the bilateral proximal internal carotid arteries, right worse than left. 2. Patent vertebral arteries. 3. Right larger than left pleural effusions, incompletely imaged. Enlargement of the main pulmonary artery suggests pulmonary hypertension. 4. Mosaic attenuation in the lung apices suggests air trapping/small airway disease.   EKG: 05/18/22: Atrial fibrillation with slow ventricular response at 39 bpm Right bundle branch block T wave abnormality, consider inferior ischemia Abnormal ECG When compared with ECG of 30-Apr-2022 18:36, No significant change was found Confirmed by Ida Rogue [phone]) on 05/21/2022 10:32:52 PM   CV: RHC 05/28/22: RHC Procedural Findings: Hemodynamics (mmHg) RA mean 8 RV 81/7  PA 85/40, mean 41 PCWP mean 22, prominent v waves to 29  Oxygen saturations: PA 69% AO 97%  Cardiac Output (Fick) 5.55  Cardiac Index (Fick) 2.86 PVR 3.4 WU  1. PCWP remains elevated.  2. 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  PA pressure remains high, but is significantly lower than on prior RHC.    Increase torsemide to 20 daily alternating with 40 daily, needs to start Opsumit, hold Entresto and  spironolactone for now with K higher.    RHC/LHC 04/30/22: Conclusions: No angiographically significant coronary artery disease. Severely elevated pulmonary artery and right heart filling pressures (mean PA 58 mmHg, mean RA 25 mmHg). Mildly elevated left heart filling pressure (LVEDP 22 mmHg). Normal Fick cardiac output/index (CO 6.4 L/min, CI 3.1 L/min/m). Small, heavily calcified right radial artery precluding catheter advancement from this approach.  Recommend alternative access if future catheterizations are needed.   Recommendations: Admit to telemetry for consultation with advanced heart failure team and optimization of severe pulmonary hypertension and right heart failure. Start IV heparin 8 hours after hemostasis achieved at right femoral arteriotomy site.  Defer reinitiation of warfarin until it is clear that invasive procedures are not needed during this admission.   Echo 04/26/22: IMPRESSIONS   1. Left ventricular ejection fraction, by estimation, is 70 to 75%. The  left ventricle has hyperdynamic function. The left ventricle has no  regional wall motion abnormalities. There is mild left ventricular  hypertrophy. Left ventricular diastolic  parameters are indeterminate. There is the interventricular septum is  flattened in systole and diastole, consistent with right ventricular  pressure and volume overload. The average left ventricular global  longitudinal strain is -24.0 %. The global  longitudinal strain is normal.   2. Right ventricular systolic function is moderately reduced. The right  ventricular size is moderately enlarged. Mildly increased right  ventricular wall thickness. There is severely elevated pulmonary artery  systolic pressure. The estimated right  ventricular systolic pressure is 449.6 mmHg.   3. Left atrial size was moderately dilated.   4. Right atrial size was severely dilated.   5. Right pleural effusion noted. Moderate pericardial effusion. The   pericardial effusion is posterior to the left ventricle and localized near  the right atrium. Small collection anterolaterally.   6. The mitral valve is abnormal. Trivial mitral valve regurgitation.  Moderate mitral annular calcification.   7. Tricuspid valve regurgitation is severe.   8. The aortic valve is tricuspid. Aortic valve regurgitation is not  visualized. Aortic valve sclerosis/calcification is present, without any  evidence of aortic stenosis. Aortic valve mean gradient measures 6.7 mmHg.   9. The inferior vena cava is dilated in size with <50% respiratory  variability, suggesting right atrial pressure of 15 mmHg.  - Comparison(s): Prior images unable to be directly viewed.    Past Medical History:  Diagnosis Date   Arthritis    Atrial fibrillation ([place])    Carotid artery disease ([place])    Dysrhythmia    Hyperlipidemia    Hypertension    Pulmonary hypertension ([place])    Type 2 diabetes mellitus ([place])     Past Surgical History:  Procedure Laterality Date   Cataract surgery     COLONOSCOPY     HERNIA REPAIR     RIGHT HEART CATH N/A 05/28/2022   Procedure: RIGHT HEART CATH;  Surgeon: Larey Dresser, MD;  Location: [facility];  Service: Cardiovascular;  Laterality: N/A;   RIGHT/LEFT HEART CATH AND CORONARY ANGIOGRAPHY N/A 04/30/2022  Procedure: RIGHT/LEFT HEART CATH AND CORONARY ANGIOGRAPHY;  Surgeon: Nelva Bush, MD;  Location: [facility];  Service: Cardiovascular;  Laterality: N/A;   YAG LASER APPLICATION Right 69/50/7225   Procedure: YAG LASER APPLICATION;  Surgeon: Williams Che, MD;  Location: [facility];  Service: Ophthalmology;  Laterality: Right;    MEDICATIONS:  amLODipine (NORVASC) 5 MG tablet   aspirin EC 81 MG tablet   atorvastatin (LIPITOR) 40 MG tablet   cholecalciferol (VITAMIN D) 25 MCG (1000 UNIT) tablet   clopidogrel (PLAVIX) 75 MG tablet   dapagliflozin propanediol (FARXIGA) 10 MG TABS tablet   fish oil-omega-3 fatty acids  1000 MG capsule   macitentan (OPSUMIT) 10 MG tablet   Misc Natural Products (GLUCOS-CHONDROIT-MSM COMPLEX) TABS   Multiple Vitamins-Minerals (MULTIVITAMIN WITH MINERALS) tablet   niacin 500 MG tablet   sacubitril-valsartan (ENTRESTO) 24-26 MG   tadalafil, PAH, (ADCIRCA) 20 MG tablet   torsemide (DEMADEX) 20 MG tablet   warfarin (COUMADIN) 5 MG tablet   No current facility-administered medications for this encounter.    Myra Gianotti, PA-C Surgical Short Stay/Anesthesiology [facility] Phone [phone] [facility] Phone [phone] 06/28/2022 6:50 PM

## 2022-07-02 ENCOUNTER — Encounter (HOSPITAL_COMMUNITY)
Admission: RE | Disposition: A | Discharge status: Home / Self Care | Payer: Self-pay | Source: Home / Self Care | Attending: Vascular Surgery

## 2022-07-02 ENCOUNTER — Inpatient Hospital Stay (HOSPITAL_COMMUNITY): Payer: Medicare PPO

## 2022-07-02 ENCOUNTER — Inpatient Hospital Stay (HOSPITAL_COMMUNITY): Payer: Medicare PPO | Admitting: Anesthesiology

## 2022-07-02 ENCOUNTER — Encounter (HOSPITAL_COMMUNITY): Payer: Self-pay | Admitting: Vascular Surgery

## 2022-07-02 ENCOUNTER — Inpatient Hospital Stay (HOSPITAL_COMMUNITY): Payer: Medicare PPO | Admitting: Vascular Surgery

## 2022-07-02 ENCOUNTER — Other Ambulatory Visit: Payer: Self-pay

## 2022-07-02 ENCOUNTER — Inpatient Hospital Stay (HOSPITAL_COMMUNITY)
Admission: RE | Admit: 2022-07-02 | Discharge: 2022-07-04 | DRG: 035 | Disposition: A | Discharge status: Home / Self Care | Payer: Medicare PPO | Attending: Vascular Surgery | Admitting: Vascular Surgery

## 2022-07-02 DIAGNOSIS — Z91048 Other nonmedicinal substance allergy status: Secondary | ICD-10-CM | POA: Diagnosis not present

## 2022-07-02 DIAGNOSIS — I110 Hypertensive heart disease with heart failure: Secondary | ICD-10-CM | POA: Diagnosis present

## 2022-07-02 DIAGNOSIS — I6523 Occlusion and stenosis of bilateral carotid arteries: Secondary | ICD-10-CM | POA: Diagnosis not present

## 2022-07-02 DIAGNOSIS — I5082 Biventricular heart failure: Secondary | ICD-10-CM | POA: Diagnosis present

## 2022-07-02 DIAGNOSIS — D649 Anemia, unspecified: Secondary | ICD-10-CM

## 2022-07-02 DIAGNOSIS — Z79899 Other long term (current) drug therapy: Secondary | ICD-10-CM

## 2022-07-02 DIAGNOSIS — Z7901 Long term (current) use of anticoagulants: Secondary | ICD-10-CM

## 2022-07-02 DIAGNOSIS — Z01818 Encounter for other preprocedural examination: Secondary | ICD-10-CM

## 2022-07-02 DIAGNOSIS — E119 Type 2 diabetes mellitus without complications: Secondary | ICD-10-CM

## 2022-07-02 DIAGNOSIS — E785 Hyperlipidemia, unspecified: Secondary | ICD-10-CM | POA: Diagnosis present

## 2022-07-02 DIAGNOSIS — I4891 Unspecified atrial fibrillation: Secondary | ICD-10-CM

## 2022-07-02 DIAGNOSIS — I10 Essential (primary) hypertension: Secondary | ICD-10-CM | POA: Diagnosis not present

## 2022-07-02 DIAGNOSIS — I2729 Other secondary pulmonary hypertension: Secondary | ICD-10-CM | POA: Diagnosis present

## 2022-07-02 DIAGNOSIS — I5032 Chronic diastolic (congestive) heart failure: Secondary | ICD-10-CM | POA: Diagnosis present

## 2022-07-02 DIAGNOSIS — M7981 Nontraumatic hematoma of soft tissue: Secondary | ICD-10-CM | POA: Diagnosis present

## 2022-07-02 DIAGNOSIS — I6522 Occlusion and stenosis of left carotid artery: Principal | ICD-10-CM | POA: Diagnosis present

## 2022-07-02 DIAGNOSIS — I6529 Occlusion and stenosis of unspecified carotid artery: Secondary | ICD-10-CM | POA: Diagnosis present

## 2022-07-02 DIAGNOSIS — Z8249 Family history of ischemic heart disease and other diseases of the circulatory system: Secondary | ICD-10-CM

## 2022-07-02 DIAGNOSIS — Z006 Encounter for examination for normal comparison and control in clinical research program: Secondary | ICD-10-CM

## 2022-07-02 DIAGNOSIS — G4733 Obstructive sleep apnea (adult) (pediatric): Secondary | ICD-10-CM | POA: Diagnosis present

## 2022-07-02 HISTORY — PX: TRANSCAROTID ARTERY REVASCULARIZATIONÂ: SHX6778

## 2022-07-02 HISTORY — DX: Unspecified diastolic (congestive) heart failure: I50.30

## 2022-07-02 HISTORY — PX: ULTRASOUND GUIDANCE FOR VASCULAR ACCESS: SHX6516

## 2022-07-02 LAB — GLUCOSE, CAPILLARY
Glucose-Capillary: 118 mg/dL — ABNORMAL HIGH (ref 70–99)
Glucose-Capillary: 158 mg/dL — ABNORMAL HIGH (ref 70–99)

## 2022-07-02 LAB — CBC
HCT: 30.8 % — ABNORMAL LOW (ref 39.0–52.0)
Hemoglobin: 9.9 g/dL — ABNORMAL LOW (ref 13.0–17.0)
MCH: 31.2 pg (ref 26.0–34.0)
MCHC: 32.1 g/dL (ref 30.0–36.0)
MCV: 97.2 fL (ref 80.0–100.0)
Platelets: 180 10*3/uL (ref 150–400)
RBC: 3.17 MIL/uL — ABNORMAL LOW (ref 4.22–5.81)
RDW: 16.6 % — ABNORMAL HIGH (ref 11.5–15.5)
WBC: 10.4 10*3/uL (ref 4.0–10.5)
nRBC: 0 % (ref 0.0–0.2)

## 2022-07-02 LAB — TYPE AND SCREEN
ABO/RH(D): A POS
Antibody Screen: NEGATIVE

## 2022-07-02 LAB — HEPATIC FUNCTION PANEL
ALT: 18 U/L (ref 0–44)
AST: 24 U/L (ref 15–41)
Albumin: 3.1 g/dL — ABNORMAL LOW (ref 3.5–5.0)
Alkaline Phosphatase: 72 U/L (ref 38–126)
Bilirubin, Direct: 0.2 mg/dL (ref 0.0–0.2)
Indirect Bilirubin: 0.7 mg/dL (ref 0.3–0.9)
Total Bilirubin: 0.9 mg/dL (ref 0.3–1.2)
Total Protein: 6.2 g/dL — ABNORMAL LOW (ref 6.5–8.1)

## 2022-07-02 LAB — CREATININE, SERUM
Creatinine, Ser: 1.76 mg/dL — ABNORMAL HIGH (ref 0.61–1.24)
GFR, Estimated: 39 mL/min — ABNORMAL LOW (ref >60)

## 2022-07-02 LAB — PROTIME-INR
INR: 1.2 (ref 0.8–1.2)
Prothrombin Time: 15.4 seconds — ABNORMAL HIGH (ref 11.4–15.2)

## 2022-07-02 LAB — POCT ACTIVATED CLOTTING TIME: Activated Clotting Time: 215 seconds

## 2022-07-02 SURGERY — TRANSCAROTID ARTERY REVASCULARIZATION (TCAR)
Anesthesia: Monitor Anesthesia Care | Site: Neck | Laterality: Right

## 2022-07-02 MED ORDER — FENTANYL CITRATE (PF) 250 MCG/5ML IJ SOLN
INTRAMUSCULAR | Status: AC
  Filled 2022-07-02: qty 5

## 2022-07-02 MED ORDER — FENTANYL CITRATE (PF) 250 MCG/5ML IJ SOLN
INTRAMUSCULAR | Status: DC | PRN
  Administered 2022-07-02 (×4): 25 ug via INTRAVENOUS

## 2022-07-02 MED ORDER — WARFARIN - PHYSICIAN DOSING INPATIENT: Freq: Every day | Status: DC

## 2022-07-02 MED ORDER — ONDANSETRON HCL 4 MG/2ML IJ SOLN
INTRAMUSCULAR | Status: DC | PRN
  Administered 2022-07-02: 4 mg via INTRAVENOUS

## 2022-07-02 MED ORDER — HYDROMORPHONE HCL 1 MG/ML IJ SOLN: 0.5000 mg | INTRAMUSCULAR | Status: DC | PRN

## 2022-07-02 MED ORDER — SENNOSIDES-DOCUSATE SODIUM 8.6-50 MG PO TABS: 1.0000 | ORAL_TABLET | Freq: Every evening | ORAL | Status: DC | PRN

## 2022-07-02 MED ORDER — AMLODIPINE BESYLATE 5 MG PO TABS
5.0000 mg | ORAL_TABLET | Freq: Every day | ORAL | Status: DC
  Administered 2022-07-03 – 2022-07-04 (×2): 5 mg via ORAL
  Filled 2022-07-02 (×2): qty 1

## 2022-07-02 MED ORDER — WARFARIN SODIUM 5 MG PO TABS
5.0000 mg | ORAL_TABLET | ORAL | Status: DC
  Administered 2022-07-02: 5 mg via ORAL
  Filled 2022-07-02: qty 1

## 2022-07-02 MED ORDER — DEXAMETHASONE SODIUM PHOSPHATE 10 MG/ML IJ SOLN
INTRAMUSCULAR | Status: AC
  Filled 2022-07-02: qty 1

## 2022-07-02 MED ORDER — ASPIRIN 81 MG PO TBEC: 81.0000 mg | DELAYED_RELEASE_TABLET | Freq: Every day | ORAL | Status: DC

## 2022-07-02 MED ORDER — POTASSIUM CHLORIDE CRYS ER 20 MEQ PO TBCR: 20.0000 meq | EXTENDED_RELEASE_TABLET | Freq: Every day | ORAL | Status: DC | PRN

## 2022-07-02 MED ORDER — MACITENTAN 10 MG PO TABS
10.0000 mg | ORAL_TABLET | Freq: Every day | ORAL | Status: DC
  Administered 2022-07-02 – 2022-07-04 (×3): 10 mg via ORAL
  Filled 2022-07-02 (×3): qty 1

## 2022-07-02 MED ORDER — GUAIFENESIN-DM 100-10 MG/5ML PO SYRP: 15.0000 mL | ORAL_SOLUTION | ORAL | Status: DC | PRN

## 2022-07-02 MED ORDER — ALUM & MAG HYDROXIDE-SIMETH 200-200-20 MG/5ML PO SUSP: 15.0000 mL | ORAL | Status: DC | PRN

## 2022-07-02 MED ORDER — ORAL CARE MOUTH RINSE: 15.0000 mL | Freq: Once | OROMUCOSAL | Status: AC

## 2022-07-02 MED ORDER — TADALAFIL (PAH) 20 MG PO TABS
40.0000 mg | ORAL_TABLET | Freq: Every day | ORAL | Status: DC
  Administered 2022-07-03 – 2022-07-04 (×2): 40 mg via ORAL
  Filled 2022-07-02 (×3): qty 2

## 2022-07-02 MED ORDER — PROPOFOL 10 MG/ML IV BOLUS
INTRAVENOUS | Status: AC
  Filled 2022-07-02: qty 20

## 2022-07-02 MED ORDER — MIDAZOLAM HCL 2 MG/2ML IJ SOLN
INTRAMUSCULAR | Status: DC | PRN
  Administered 2022-07-02 (×2): .5 mg via INTRAVENOUS

## 2022-07-02 MED ORDER — ONDANSETRON HCL 4 MG/2ML IJ SOLN: 4.0000 mg | Freq: Four times a day (QID) | INTRAMUSCULAR | Status: DC | PRN

## 2022-07-02 MED ORDER — PHENOL 1.4 % MT LIQD: 1.0000 | OROMUCOSAL | Status: DC | PRN

## 2022-07-02 MED ORDER — INSULIN ASPART 100 UNIT/ML IJ SOLN: 0.0000 [IU] | INTRAMUSCULAR | Status: DC | PRN

## 2022-07-02 MED ORDER — OXYCODONE HCL 5 MG PO TABS: 5.0000 mg | ORAL_TABLET | Freq: Once | ORAL | Status: DC | PRN

## 2022-07-02 MED ORDER — CEFAZOLIN SODIUM-DEXTROSE 2-4 GM/100ML-% IV SOLN
2.0000 g | INTRAVENOUS | Status: AC
  Administered 2022-07-02: 2 g via INTRAVENOUS
  Filled 2022-07-02: qty 100

## 2022-07-02 MED ORDER — HEPARIN 6000 UNIT IRRIGATION SOLUTION
Status: AC
  Filled 2022-07-02: qty 500

## 2022-07-02 MED ORDER — PROTAMINE SULFATE 10 MG/ML IV SOLN
INTRAVENOUS | Status: DC | PRN
  Administered 2022-07-02: 50 mg via INTRAVENOUS

## 2022-07-02 MED ORDER — ACETAMINOPHEN 650 MG RE SUPP: 325.0000 mg | RECTAL | Status: DC | PRN

## 2022-07-02 MED ORDER — ROCURONIUM BROMIDE 10 MG/ML (PF) SYRINGE
PREFILLED_SYRINGE | INTRAVENOUS | Status: AC
Start: 2022-07-02 — End: ?
  Filled 2022-07-02: qty 10

## 2022-07-02 MED ORDER — MAGNESIUM SULFATE 2 GM/50ML IV SOLN: 2.0000 g | Freq: Every day | INTRAVENOUS | Status: DC | PRN

## 2022-07-02 MED ORDER — IODIXANOL 320 MG/ML IV SOLN
INTRAVENOUS | Status: DC | PRN
  Administered 2022-07-02: 40 mL via INTRA_ARTERIAL

## 2022-07-02 MED ORDER — HYDRALAZINE HCL 20 MG/ML IJ SOLN: 5.0000 mg | INTRAMUSCULAR | Status: DC | PRN

## 2022-07-02 MED ORDER — PANTOPRAZOLE SODIUM 40 MG PO TBEC
40.0000 mg | DELAYED_RELEASE_TABLET | Freq: Every day | ORAL | Status: DC
  Administered 2022-07-03 – 2022-07-04 (×2): 40 mg via ORAL
  Filled 2022-07-02 (×2): qty 1

## 2022-07-02 MED ORDER — ONDANSETRON HCL 4 MG/2ML IJ SOLN
INTRAMUSCULAR | Status: AC
  Filled 2022-07-02: qty 2

## 2022-07-02 MED ORDER — CHLORHEXIDINE GLUCONATE CLOTH 2 % EX PADS: 6.0000 | MEDICATED_PAD | Freq: Once | CUTANEOUS | Status: DC

## 2022-07-02 MED ORDER — DAPAGLIFLOZIN PROPANEDIOL 10 MG PO TABS
10.0000 mg | ORAL_TABLET | Freq: Every day | ORAL | Status: DC
  Administered 2022-07-02 – 2022-07-04 (×3): 10 mg via ORAL
  Filled 2022-07-02 (×3): qty 1

## 2022-07-02 MED ORDER — DOCUSATE SODIUM 100 MG PO CAPS
100.0000 mg | ORAL_CAPSULE | Freq: Every day | ORAL | Status: DC
  Administered 2022-07-03 – 2022-07-04 (×2): 100 mg via ORAL
  Filled 2022-07-02 (×2): qty 1

## 2022-07-02 MED ORDER — CHLORHEXIDINE GLUCONATE 0.12 % MT SOLN
15.0000 mL | Freq: Once | OROMUCOSAL | Status: AC
  Administered 2022-07-02: 15 mL via OROMUCOSAL
  Filled 2022-07-02: qty 15

## 2022-07-02 MED ORDER — LACTATED RINGERS IV SOLN: INTRAVENOUS | Status: DC

## 2022-07-02 MED ORDER — CEFAZOLIN SODIUM-DEXTROSE 2-4 GM/100ML-% IV SOLN
2.0000 g | Freq: Three times a day (TID) | INTRAVENOUS | Status: AC
  Administered 2022-07-02 – 2022-07-03 (×2): 2 g via INTRAVENOUS
  Filled 2022-07-02 (×2): qty 100

## 2022-07-02 MED ORDER — ATORVASTATIN CALCIUM 40 MG PO TABS
40.0000 mg | ORAL_TABLET | Freq: Every day | ORAL | Status: DC
  Administered 2022-07-02 – 2022-07-03 (×2): 40 mg via ORAL
  Filled 2022-07-02 (×2): qty 1

## 2022-07-02 MED ORDER — METOPROLOL TARTRATE 5 MG/5ML IV SOLN: 2.0000 mg | INTRAVENOUS | Status: DC | PRN

## 2022-07-02 MED ORDER — SODIUM CHLORIDE 0.9 % IV SOLN: INTRAVENOUS | Status: DC

## 2022-07-02 MED ORDER — LIDOCAINE-EPINEPHRINE (PF) 1 %-1:200000 IJ SOLN
INTRAMUSCULAR | Status: DC | PRN
  Administered 2022-07-02: 40 mL

## 2022-07-02 MED ORDER — SACUBITRIL-VALSARTAN 24-26 MG PO TABS
1.0000 | ORAL_TABLET | Freq: Two times a day (BID) | ORAL | Status: DC
  Administered 2022-07-02 – 2022-07-04 (×4): 1 via ORAL
  Filled 2022-07-02 (×4): qty 1

## 2022-07-02 MED ORDER — HEPARIN SODIUM (PORCINE) 1000 UNIT/ML IJ SOLN
INTRAMUSCULAR | Status: DC | PRN
  Administered 2022-07-02: 3000 [IU] via INTRAVENOUS
  Administered 2022-07-02: 9000 [IU] via INTRAVENOUS

## 2022-07-02 MED ORDER — LABETALOL HCL 5 MG/ML IV SOLN: 10.0000 mg | INTRAVENOUS | Status: DC | PRN

## 2022-07-02 MED ORDER — HEMOSTATIC AGENTS (NO CHARGE) OPTIME
TOPICAL | Status: DC | PRN
  Administered 2022-07-02: 1 via TOPICAL

## 2022-07-02 MED ORDER — 0.9 % SODIUM CHLORIDE (POUR BTL) OPTIME
TOPICAL | Status: DC | PRN
  Administered 2022-07-02: 1000 mL

## 2022-07-02 MED ORDER — WARFARIN SODIUM 5 MG PO TABS: 5.0000 mg | ORAL_TABLET | ORAL | Status: DC

## 2022-07-02 MED ORDER — OXYCODONE HCL 5 MG PO TABS: 5.0000 mg | ORAL_TABLET | ORAL | Status: DC | PRN

## 2022-07-02 MED ORDER — MACITENTAN 10 MG PO TABS: 10.0000 mg | ORAL_TABLET | Freq: Every day | ORAL | Status: DC

## 2022-07-02 MED ORDER — WARFARIN SODIUM 7.5 MG PO TABS: 7.5000 mg | ORAL_TABLET | ORAL | Status: DC

## 2022-07-02 MED ORDER — FENTANYL CITRATE (PF) 100 MCG/2ML IJ SOLN: 25.0000 ug | INTRAMUSCULAR | Status: DC | PRN

## 2022-07-02 MED ORDER — SODIUM CHLORIDE 0.9 % IV SOLN: 500.0000 mL | Freq: Once | INTRAVENOUS | Status: DC | PRN

## 2022-07-02 MED ORDER — LIDOCAINE-EPINEPHRINE (PF) 1 %-1:200000 IJ SOLN
INTRAMUSCULAR | Status: AC
  Filled 2022-07-02: qty 30

## 2022-07-02 MED ORDER — TORSEMIDE 20 MG PO TABS
40.0000 mg | ORAL_TABLET | Freq: Every morning | ORAL | Status: DC
  Administered 2022-07-03 – 2022-07-04 (×2): 40 mg via ORAL
  Filled 2022-07-02 (×2): qty 2

## 2022-07-02 MED ORDER — HEPARIN SODIUM (PORCINE) 5000 UNIT/ML IJ SOLN
5000.0000 [IU] | Freq: Three times a day (TID) | INTRAMUSCULAR | Status: DC
  Administered 2022-07-03 – 2022-07-04 (×2): 5000 [IU] via SUBCUTANEOUS
  Filled 2022-07-02 (×2): qty 1

## 2022-07-02 MED ORDER — BISACODYL 5 MG PO TBEC: 5.0000 mg | DELAYED_RELEASE_TABLET | Freq: Every day | ORAL | Status: DC | PRN

## 2022-07-02 MED ORDER — SUCCINYLCHOLINE CHLORIDE 200 MG/10ML IV SOSY
PREFILLED_SYRINGE | INTRAVENOUS | Status: AC
  Filled 2022-07-02: qty 10

## 2022-07-02 MED ORDER — CLOPIDOGREL BISULFATE 75 MG PO TABS
75.0000 mg | ORAL_TABLET | Freq: Every day | ORAL | Status: DC
  Administered 2022-07-03 – 2022-07-04 (×2): 75 mg via ORAL
  Filled 2022-07-02 (×2): qty 1

## 2022-07-02 MED ORDER — ASPIRIN 81 MG PO TBEC
81.0000 mg | DELAYED_RELEASE_TABLET | Freq: Every day | ORAL | Status: DC
  Administered 2022-07-03 – 2022-07-04 (×2): 81 mg via ORAL
  Filled 2022-07-02 (×2): qty 1

## 2022-07-02 MED ORDER — HEPARIN 6000 UNIT IRRIGATION SOLUTION
Status: DC | PRN
  Administered 2022-07-02: 1

## 2022-07-02 MED ORDER — MIDAZOLAM HCL 2 MG/2ML IJ SOLN
INTRAMUSCULAR | Status: AC
Start: 2022-07-02 — End: ?
  Filled 2022-07-02: qty 2

## 2022-07-02 MED ORDER — ACETAMINOPHEN 325 MG PO TABS: 325.0000 mg | ORAL_TABLET | ORAL | Status: DC | PRN

## 2022-07-02 MED ORDER — LIDOCAINE 2% (20 MG/ML) 5 ML SYRINGE
INTRAMUSCULAR | Status: AC
  Filled 2022-07-02: qty 5

## 2022-07-02 MED ORDER — OXYCODONE HCL 5 MG/5ML PO SOLN: 5.0000 mg | Freq: Once | ORAL | Status: DC | PRN

## 2022-07-02 MED ORDER — CLOPIDOGREL BISULFATE 75 MG PO TABS: 75.0000 mg | ORAL_TABLET | Freq: Every day | ORAL | Status: DC

## 2022-07-02 SURGICAL SUPPLY — 48 items
BAG BANDED W/RUBBER/TAPE 36X54 (MISCELLANEOUS) ×2 IMPLANT
BAG COUNTER SPONGE SURGICOUNT (BAG) ×2 IMPLANT
BALLN STERLING RX 5X40X80 (BALLOONS) ×2
BALLOON STERLING RX 5X40X80 (BALLOONS) IMPLANT
CANISTER SUCT 3000ML PPV (MISCELLANEOUS) ×2 IMPLANT
CATH BEACON 5 .035 40 KMP TP (CATHETERS) IMPLANT
CATH BEACON 5 .038 40 KMP TP (CATHETERS) ×2
CLIP VESOCCLUDE MED 6/CT (CLIP) ×2 IMPLANT
CLIP VESOCCLUDE SM WIDE 6/CT (CLIP) ×2 IMPLANT
COVER DOME SNAP 22 D (MISCELLANEOUS) ×2 IMPLANT
COVER PROBE W GEL 5X96 (DRAPES) ×2 IMPLANT
DERMABOND ADVANCED .7 DNX12 (GAUZE/BANDAGES/DRESSINGS) ×2 IMPLANT
DRAPE FEMORAL ANGIO 80X135IN (DRAPES) ×2 IMPLANT
ELECT REM PT RETURN 9FT ADLT (ELECTROSURGICAL) ×2
ELECTRODE REM PT RTRN 9FT ADLT (ELECTROSURGICAL) ×2 IMPLANT
GLOVE BIO SURGEON STRL SZ7.5 (GLOVE) ×2 IMPLANT
GOWN STRL REUS W/ TWL LRG LVL3 (GOWN DISPOSABLE) ×4 IMPLANT
GOWN STRL REUS W/ TWL XL LVL3 (GOWN DISPOSABLE) ×2 IMPLANT
GOWN STRL REUS W/TWL LRG LVL3 (GOWN DISPOSABLE) ×4
GOWN STRL REUS W/TWL XL LVL3 (GOWN DISPOSABLE) ×2
GUIDEWIRE ENROUTE 0.014 (WIRE) ×2 IMPLANT
HEMOSTAT SNOW SURGICEL 2X4 (HEMOSTASIS) IMPLANT
INTRODUCER KIT GALT 7CM (INTRODUCER) ×2
KIT BASIN OR (CUSTOM PROCEDURE TRAY) ×2 IMPLANT
KIT ENCORE 26 ADVANTAGE (KITS) ×2 IMPLANT
KIT INTRODUCER GALT 7 (INTRODUCER) ×2 IMPLANT
KIT TURNOVER KIT B (KITS) ×2 IMPLANT
NDL HYPO 25GX1X1/2 BEV (NEEDLE) IMPLANT
NEEDLE HYPO 25GX1X1/2 BEV (NEEDLE) IMPLANT
PACK CAROTID (CUSTOM PROCEDURE TRAY) ×2 IMPLANT
POSITIONER HEAD DONUT 9IN (MISCELLANEOUS) ×2 IMPLANT
PROTECTION STATION PRESSURIZED (MISCELLANEOUS) ×2
SET MICROPUNCTURE 5F STIFF (MISCELLANEOUS) ×2 IMPLANT
STATION PROTECTION PRESSURIZED (MISCELLANEOUS) ×2 IMPLANT
STENT TRANSCAROTID SYSTEM 9X40 (Permanent Stent) IMPLANT
SUT MNCRL AB 4-0 PS2 18 (SUTURE) ×2 IMPLANT
SUT PROLENE 5 0 C 1 24 (SUTURE) ×2 IMPLANT
SUT SILK 2 0 PERMA HAND 18 BK (SUTURE) ×2 IMPLANT
SUT VIC AB 3-0 SH 27 (SUTURE) ×2
SUT VIC AB 3-0 SH 27X BRD (SUTURE) ×2 IMPLANT
SYR 10ML LL (SYRINGE) ×6 IMPLANT
SYR 20ML LL LF (SYRINGE) ×2 IMPLANT
SYR CONTROL 10ML LL (SYRINGE) IMPLANT
SYSTEM TRANSCAROTID NEUROPRTCT (MISCELLANEOUS) ×2 IMPLANT
TOWEL GREEN STERILE (TOWEL DISPOSABLE) ×2 IMPLANT
TRANSCAROTID NEUROPROTECT SYS (MISCELLANEOUS) ×2
WATER STERILE IRR 1000ML POUR (IV SOLUTION) ×2 IMPLANT
WIRE BENTSON .035X145CM (WIRE) ×2 IMPLANT

## 2022-07-02 NOTE — Progress Notes (Signed)
Notified by CCMD that patient is having more pauses and HR dropping to 26 but rebounds to 31-40. Patient asymptomatic.   [name] notified. Continue to monitor and hold antihypertensives.  Neck incision bruised, swollen but soft. Patient denies any pain. No problem swallowing.

## 2022-07-02 NOTE — Transfer of Care (Signed)
Immediate Anesthesia Transfer of Care Note  Patient: Robert Baldwin  Procedure(s) Performed: Transcarotid Artery Revascularization (Left: Neck) ULTRASOUND GUIDANCE FOR VASCULAR ACCESS (Right: Groin)  Patient Location: PACU  Anesthesia Type:MAC  Level of Consciousness: awake, alert  and oriented  Airway & Oxygen Therapy: Patient Spontanous Breathing and Patient connected to nasal cannula oxygen  Post-op Assessment: Report given to RN, Post -op Vital signs reviewed and stable, Patient moving all extremities X 4 and Patient able to stick tongue midline  Post vital signs: Reviewed and stable  Last Vitals:  Vitals Value Taken Time  BP 163/50 07/02/22 1122  Temp    Pulse 58 07/02/22 1125  Resp 16 07/02/22 1125  SpO2 95 % 07/02/22 1125  Vitals shown include unvalidated device data.  Last Pain:  Vitals:   07/02/22 0748  TempSrc:   PainSc: 0-No pain         Complications: No notable events documented.

## 2022-07-02 NOTE — Progress Notes (Signed)
Pharmacy Consult for Pulmonary Hypertension Treatment   Indication - Continuation of prior to admission medication   Patient is 81 y.o.  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   Continuing this medication order as an inpatient requires that monitoring parameters per REMS requirements must be met.  Chronic therapy is under the supervision of Dr. Loralie Champagne who is enrolled in the REMS program and is being notified of continuation of therapy. A staff message in EPIC has been sent notifying the certified prescriber.  Per patient report has previously been educated on Hepatotoxicity . On admission pregnancy risk has been assessed and no monitoring required. Hepatic function has been evaluated. AST/ALT appropriate to continue medication at this time.     Latest Ref Rng & Units 06/27/2022   11:14 AM 04/30/2022   11:30 AM  Hepatic Function  Total Protein 6.5 - 8.1 g/dL 7.0  6.6   Albumin 3.5 - 5.0 g/dL 3.6  3.4   AST 15 - 41 U/L 28  35   ALT 0 - 44 U/L 20  17   Alk Phosphatase 38 - 126 U/L 83  76   Total Bilirubin 0.3 - 1.2 mg/dL 0.8  1.5     If any question arise or pregnancy is identified during hospitalization, contact for bosentan: [phone]; macitentan: [phone]; ambrisentan: [phone].  Thank for you allowing [place] to participate in the care of this patient.  Sloan Leiter, PharmD, BCPS, BCCCP Clinical Pharmacist Please refer to [facility] for [place] numbers 07/02/2022, 3:11 PM

## 2022-07-02 NOTE — Progress Notes (Signed)
  Day of Surgery Note    Subjective:  no complaints; resting comfortably in recovery   Vitals:   07/02/22 1121 07/02/22 1130  BP: (!) 163/50 (!) 137/40  Pulse: (!) 59 (!) 54  Resp: 13 15  Temp:    SpO2: 96% 95%    Incisions:   left neck incision is clean and dry Neuro:  moving all extremities equally Lungs:  non labored    Assessment/Plan:  This is a 81 y.o. male who is s/p  Left TCAR  -pt doing well and neuro in tact in recovery -asa/plavix/statin -to 4 east later today -anticipate discharge tomorrow if uneventful evening   Yavuz Kirby, PA-C 07/02/2022 11:45 AM [phone]

## 2022-07-02 NOTE — H&P (Signed)
History and Physical Interval Note:  07/02/2022 9:22 AM  Rohaan JACKIE LITTLEJOHN  has presented today for surgery, with the diagnosis of Bilateral carotid artery disease.  The various methods of treatment have been discussed with the patient and family. After consideration of risks, benefits and other options for treatment, the patient has consented to  Procedure(s): Transcarotid Artery Revascularization (Left) as a surgical intervention.  The patient's history has been reviewed, patient examined, no change in status, stable for surgery.  I have reviewed the patient's chart and labs.  Questions were answered to the patient's satisfaction.    Discussed plan for left TCAR for high-grade asymptomatic stenosis.  This will be awake with local and MAC given too high risk for general anesthesia.  Risks benefits discussed including 1% perioperative stroke risk.  Marty Heck    Patient name: RAYQUAN AMRHEIN  MRN: [id number]        DOB: 08-Jul-1941          Sex: male   REASON FOR CONSULT: Discuss carotid intervention in the setting of pulmonary hypertension   HPI: Tyvon Eggenberger Secrist is a 81 y.o. male, with history of atrial fibrillation on Coumadin, hypertension, hyperlipidemia, diabetes, congestive heart failure with RV failure and severe pulmonary hypertension that presents to discuss carotid intervention.  Patient was initially seen by my partner Dr. Donnetta Hutching in [place] with asymptomatic bilateral carotid disease.  He was scheduled for left carotid endarterectomy.  Unfortunately he has evidence of severe pulmonary hypertension and underwent right heart cath last week and Dr. Aundra Dubin informed me that he was too high risk for general anesthesia.  He presents today to discuss options.  No new neurologic events.         Past Medical History:  Diagnosis Date   Atrial fibrillation ([place])     Hyperlipidemia     Hypertension     Type 2 diabetes mellitus [facility])             Past Surgical History:  Procedure Laterality Date    Cataract surgery       HERNIA REPAIR       RIGHT HEART CATH N/A 05/28/2022    Procedure: RIGHT HEART CATH;  Surgeon: Larey Dresser, MD;  Location: [facility];  Service: Cardiovascular;  Laterality: N/A;   RIGHT/LEFT HEART CATH AND CORONARY ANGIOGRAPHY N/A 04/30/2022    Procedure: RIGHT/LEFT HEART CATH AND CORONARY ANGIOGRAPHY;  Surgeon: Nelva Bush, MD;  Location: [facility];  Service: Cardiovascular;  Laterality: N/A;   YAG LASER APPLICATION Right 71/24/5809    Procedure: YAG LASER APPLICATION;  Surgeon: Williams Che, MD;  Location: [facility];  Service: Ophthalmology;  Laterality: Right;           Family History  Problem Relation Age of Onset   Stroke Other     Heart disease Other        SOCIAL HISTORY: Social History         Socioeconomic History   Marital status: Married      Spouse name: Not on file   Number of children: Not on file   Years of education: Not on file   Highest education level: Not on file  Occupational History   Occupation: Full time      Comment: Contractor  Tobacco Use   Smoking status: Never   Smokeless tobacco: Never   Tobacco comments:      Never smoke 05/18/22  Vaping Use   Vaping Use: Never used  Substance and Sexual Activity   Alcohol use: No      Alcohol/week: 0.0 standard drinks of alcohol   Drug use: No   Sexual activity: Not on file  Other Topics Concern   Not on file  Social History Narrative   Not on file    Social Determinants of Health    Financial Resource Strain: Not on file  Food Insecurity: Not on file  Transportation Needs: Not on file  Physical Activity: Not on file  Stress: Not on file  Social Connections: Not on file  Intimate Partner Violence: Not on file           Allergies  Allergen Reactions   Other Rash      Bandaids            Current Outpatient Medications  Medication Sig Dispense Refill   amLODipine (NORVASC) 5 MG tablet Take 1 tablet (5 mg total) by mouth daily. 30 tablet  11   atorvastatin (LIPITOR) 40 MG tablet Take 40 mg by mouth at bedtime.       cholecalciferol (VITAMIN D) 25 MCG (1000 UNIT) tablet Take 1,000 Units by mouth in the morning and at bedtime.       dapagliflozin propanediol (FARXIGA) 10 MG TABS tablet Take 1 tablet (10 mg total) by mouth daily. 30 tablet 5   fish oil-omega-3 fatty acids 1000 MG capsule Take 1 g by mouth every evening.       macitentan (OPSUMIT) 10 MG tablet Take 10 mg by mouth daily.       Misc Natural Products (GLUCOS-CHONDROIT-MSM COMPLEX) TABS Take 2 tablets by mouth daily.       Multiple Vitamins-Minerals (MULTIVITAMIN WITH MINERALS) tablet Take 1 tablet by mouth in the morning.  Centrum Silver       niacin 500 MG tablet Take 500 mg by mouth in the morning.       tadalafil, PAH, (ADCIRCA) 20 MG tablet Take 2 tablets (40 mg total) by mouth daily. 60 tablet 5   torsemide (DEMADEX) 20 MG tablet Take 1 tablet (20 mg total) by mouth every other day AND 2 tablets (40 mg total) every other day. 30 tablet 6   warfarin (COUMADIN) 5 MG tablet Take 5-7.5 mg by mouth See admin instructions. Managed by Dr. Trena Platt office Take 1.5 tablets (7.5 mg) by mouth in the evenings on Sundays & Thursdays. Take 1 tablet (5 mg) by mouth in the evening on Mondays, Tuesdays, Wednesdays, Fridays & Saturdays.        No current facility-administered medications for this visit.      REVIEW OF SYSTEMS:  _0  denotes positive finding, _1  denotes negative finding Cardiac   Comments:  Chest pain or chest pressure:      Shortness of breath upon exertion:      Short of breath when lying flat:      Irregular heart rhythm:             Vascular      Pain in calf, thigh, or hip brought on by ambulation:      Pain in feet at night that wakes you up from your sleep:       Blood clot in your veins:      Leg swelling:              Pulmonary      Oxygen at home:      Productive cough:       Wheezing:  Neurologic      Sudden weakness in arms or  legs:       Sudden numbness in arms or legs:       Sudden onset of difficulty speaking or slurred speech:      Temporary loss of vision in one eye:       Problems with dizziness:              Gastrointestinal      Blood in stool:       Vomited blood:              Genitourinary      Burning when urinating:       Blood in urine:             Psychiatric      Major depression:              Hematologic      Bleeding problems:      Problems with blood clotting too easily:             Skin      Rashes or ulcers:             Constitutional      Fever or chills:          PHYSICAL EXAM:     Vitals:    06/05/22 1434 06/05/22 1442  BP: (!) 140/46 (!) 148/50  Pulse: (!) 41 (!) 41  Resp: 18    Temp: 97.8 F (36.6 C)    TempSrc: Temporal    SpO2: 94%    Weight: 185 lb (83.9 kg)    Height: _0  (1.702 m)        GENERAL: The patient is a well-nourished male, in no acute distress. The vital signs are documented above. CARDIAC: There is a regular rate and rhythm.  VASCULAR:  No previous neck incisions PULMONARY: No respiratory distress. ABDOMEN: Soft and non-tender MUSCULOSKELETAL: There are no major deformities or cyanosis. NEUROLOGIC: No focal weakness or paresthesias are detected.  CN II-XII grossly intact. SKIN: There are no ulcers or rashes noted. PSYCHIATRIC: The patient has a normal affect.   DATA:    Reviewed CTA neck 04/12/2022 with evidence of bilateral high-grade ICA stenosis   Assessment/Plan:   81 year old male with history of atrial fibrillation on Coumadin, hypertension, hyperlipidemia, diabetes, congestive heart failure with RV failure and severe pulmonary hypertensionwho who was scheduled for left carotid endarterectomy with me for asymptomatic high-grade bilateral ICA stenosis after evaluation by Dr. Donnetta Hutching in [place].  Unfortunately he has CHF with RV failure and severe pulmonary hypertension.  He underwent right heart cath last week and Dr. Aundra Dubin and  cardiology feels he is too high risk for general anesthesia.  I have subsequently discussed the option of medical management for his carotid artery disease versus an awake TCAR.  We discussed the risks and benefits of each.  He is amendable to proceed with TCAR after we had a long discussion.  I have asked that he start an 81 mg aspirin and have given him a prescription for Plavix to start before surgery.  He will also need to hold Coumadin for 5 days.  Risks benefits discussed including 1% perioperative stroke risk, bleeding, MI or worsening heart failure including cardiac arrest, etc.  He wishes to proceed.  We will schedule his surgery after he meets with Dr. Aundra Dubin next week.     Marty Heck, MD [facility] Office:  [phone] 

## 2022-07-02 NOTE — Anesthesia Procedure Notes (Signed)
Procedure Name: MAC Date/Time: 07/02/2022 9:40 AM  Performed by: Babs Bertin, CRNAPre-anesthesia Checklist: Patient identified, Emergency Drugs available, Suction available, Patient being monitored and Timeout performed Patient Re-evaluated:Patient Re-evaluated prior to induction Oxygen Delivery Method: Nasal cannula

## 2022-07-02 NOTE — Op Note (Signed)
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  Ultrasound-guided access right common femoral vein for delivery of TCAR venous sheath 2.  Transcatheter placement of intravascular stent in the left cervical carotid artery after cutdown including angioplasty with distal embolic protection (left TCAR)  Surgeon: Dr. Marty Heck, MD  Assistant: Roxy Horseman, [place]  Indications: 81 year old male who was seen by Dr. Donnetta Hutching in [place] with bilateral high-grade asymptomatic carotid stenosis.  Ultimately we recommended left carotid endarterectomy but he was found to be too high risk for general anesthesia after evaluation by cardiology due to severe pulmonary hypertension.  He presents today for left TCAR awake under local anethesia after risks benefits discussed.  Findings: TCAR venous sheath placed in the right common femoral vein under US guidance.  The left common carotid artery was then dissected out with a transverse incision above the left clavicle.  Initial carotid angiogram showed a high-grade greater than 80% calcified left internal carotid artery stenosis.  After a working sheath was placed we went on active flow reversal and the left internal carotid high grade stenosis was crossed and then angioplastied with a 5 mm x 40 mm angioplasty balloon and stented with a 9 mm x 40 mm Enroute stent.  No significant residual stenosis.  Flow reversal time was 9 minutes.  Anesthesia: Local with MAC  Details: Patient was taken to the operating room after informed consent was obtained.  Placed on the operative table supine position.  Ultimately the common carotid artery was marked out just above the left clavicle and a transverse incision was planned.  The left neck and right groin were then prepped and draped in standard sterile fashion.  Antibiotics were given.  Timeout performed.  Initially used 20 mL of 1% lidocaine with epinephrine and anesthetize the incision in the left neck.  Then turned our attention to the right groin where we evaluated the right common femoral vein with ultrasound, it was patent and image was saved.  This was accessed under ultrasound guidance, placed a microwire and then a microsheath and then ultimately a Bentson wire and the TCAR venous sheath with good venous return.  Then turned our attention to the left neck where a transverse incision was made once fingerbreath above the clavicle.  Dissected through the subcutaneous tissue and platysma with Bovie cautery.  The heads of the sternocleidomastoid were divided in a muscle sparring technique and we identified the internal jugular vein that was mobilized lateral and the vagus nerve that was preserved.  The common carotid was dissected out in the wound and controlled with a vessel loop and umbilical tape.  A pursestring 5-0 Prolene was placed on the anterior wall of the common carotid.  Patient was given 100 units/kg IV heparin.  We checked an ACT to maintain greater than 250.  This was then accessed with a micro access needle, placed a microwire, and then a microsheath.  A left carotid angiogram was obtained.  Pertinent findings noted above showing an 80% carotid stenosis.  Ultimately the carotid bifurcation was marked.  We placed a J-wire below the carotid bifurcation and then placed our working arterial sheath in the common carotid in the base of the wound.  This was secured with multiple 3-0 silks.  I then connected the filter to the sheath in the right groin and we were on passive flow reversal.  We got additional planning imaging.  We  then went on active clamp after we did a TCAR timeout and had good active flow reversal.  The lesion was crossed with a angled guide catheter and 014 wire.  It was angioplastied in the distal common carotid proximal ICA with a 5 mm x 40 mm angioplasty balloon and  stented with a 9 mm x 40 mm stent.  Excellent results.  Widely patent stent.  We took a final angiogram after 2 minutes of active flow reversal.  We then disconnected the filter after returning the blood in the venous sheath.  The arterial sheath was removed from the common carotid and tied down the pursestring with good hemostasis.  The neck was then irrigated out and we listened with pencil Doppler and had good arterial flow.  Protamine was given for reversal.  The neck incision was closed multiple layers 3-0 Vicryl 4-0 Monocryl and Dermabond.  The venous sheath was removed after protamine was given and manual pressure was held with good hemostasis.  Taken to recovery neurologically intact.  Complication: None  Condition: Stable  Marty Heck, MD [facility] [place] Office: [place]

## 2022-07-02 NOTE — Progress Notes (Signed)
Patient arrived to 4E from PACU. Vitals taken and stable. Tele placed and CCMD notified. CHG completed. Right femoral level 0 and left neck incision clean, dry, and intact. Patient oriented to unit and staff. Family at the bedside.  Robert Baldwin

## 2022-07-02 NOTE — Discharge Instructions (Signed)
   [facility]  Discharge Instructions   Carotid Surgery  Please refer to the following instructions for your post-procedure care. Your surgeon or physician assistant will discuss any changes with you.  Activity  You are encouraged to walk as much as you can. You can slowly return to normal activities but must avoid strenuous activity and heavy lifting until your doctor tell you it's okay. Avoid activities such as vacuuming or swinging a golf club. You can drive after one week if you are comfortable and you are no longer taking prescription pain medications. It is normal to feel tired for serval weeks after your surgery. It is also normal to have difficulty with sleep habits, eating, and bowel movements after surgery. These will go away with time.  Bathing/Showering  Shower daily after you go home. Do not soak in a bathtub, hot tub, or swim until the incision heals completely.  Incision Care  Shower every day. Clean your incision with mild soap and water. Pat the area dry with a clean towel. You do not need a bandage unless otherwise instructed. Do not apply any ointments or creams to your incision. You may have skin glue on your incision. Do not peel it off. It will come off on its own in about one week. Your incision may feel thickened and raised for several weeks after your surgery. This is normal and the skin will soften over time.   For Men Only: It's okay to shave around the incision but do not shave the incision itself for 2 weeks. It is common to have numbness under your chin that could last for several months.  Diet  Resume your normal diet. There are no special food restrictions following this procedure. A low fat/low cholesterol diet is recommended for all patients with vascular disease. In order to heal from your surgery, it is CRITICAL to get adequate nutrition. Your body requires vitamins, minerals, and protein. Vegetables are the best source of  vitamins and minerals. Vegetables also provide the perfect balance of protein. Processed food has little nutritional value, so try to avoid this.  Medications  Resume taking all of your medications unless your doctor or physician assistant tells you not to. If your incision is causing pain, you may take over-the- counter pain relievers such as acetaminophen (Tylenol). If you were prescribed a stronger pain medication, please be aware these medications can cause nausea and constipation. Prevent nausea by taking the medication with a snack or meal. Avoid constipation by drinking plenty of fluids and eating foods with a high amount of fiber, such as fruits, vegetables, and grains.   Do not take Tylenol if you are taking prescription pain medications.  Follow Up  Our office will schedule a follow up appointment 2-3 weeks following discharge.  Please call us immediately for any of the following conditions  . Increased pain, redness, drainage (pus) from your incision site. . Fever of 101 degrees or higher. . If you should develop stroke (slurred speech, difficulty swallowing, weakness on one side of your body, loss of vision) you should call 911 and go to the nearest emergency room. .  Reduce your risk of vascular disease:  . Stop smoking. If you would like help call [facility] at 1-800-QUIT-NOW (1-800-784-8669) or [facility] at 336-586-4000. . Manage your cholesterol . Maintain a desired weight . Control your diabetes . Keep your blood pressure down .  If you have any questions, please call the office at [phone]. 

## 2022-07-02 NOTE — Progress Notes (Signed)
This RN noticed patient's neck more swollen and tight. Paged the MD and came to bedside. Pressure held on neck. Vital signs stable.  Robert Baldwin

## 2022-07-02 NOTE — Anesthesia Procedure Notes (Signed)
Arterial Line Insertion Start/End9/08/2022 9:05 AM, 07/02/2022 9:10 AM Performed by: Sheppard Evens, CRNA, CRNA  Patient location: OOR procedure area. Preanesthetic checklist: patient identified, IV checked, risks and benefits discussed, surgical consent and monitors and equipment checked Lidocaine 1% used for infiltration Left, radial was placed Catheter size: 20 G Hand hygiene performed  and maximum sterile barriers used   Attempts: 3 (2 attempts by Pasty Arch, CRNA. one attempt by C. Ebonique Hallstrom, CRNA) Procedure performed without using ultrasound guided technique. Ultrasound Notes:anatomy identified Following insertion, dressing applied and Biopatch. Post procedure assessment: normal  Patient tolerated the procedure well with no immediate complications.

## 2022-07-03 ENCOUNTER — Encounter (HOSPITAL_COMMUNITY): Payer: Self-pay | Admitting: Vascular Surgery

## 2022-07-03 DIAGNOSIS — I4891 Unspecified atrial fibrillation: Secondary | ICD-10-CM | POA: Diagnosis not present

## 2022-07-03 LAB — BASIC METABOLIC PANEL
Anion gap: 7 (ref 5–15)
BUN: 47 mg/dL — ABNORMAL HIGH (ref 8–23)
CO2: 26 mmol/L (ref 22–32)
Calcium: 8.7 mg/dL — ABNORMAL LOW (ref 8.9–10.3)
Chloride: 107 mmol/L (ref 98–111)
Creatinine, Ser: 1.62 mg/dL — ABNORMAL HIGH (ref 0.61–1.24)
GFR, Estimated: 43 mL/min — ABNORMAL LOW (ref >60)
Glucose, Bld: 162 mg/dL — ABNORMAL HIGH (ref 70–99)
Potassium: 3.9 mmol/L (ref 3.5–5.1)
Sodium: 140 mmol/L (ref 135–145)

## 2022-07-03 LAB — CBC
HCT: 28.9 % — ABNORMAL LOW (ref 39.0–52.0)
Hemoglobin: 9 g/dL — ABNORMAL LOW (ref 13.0–17.0)
MCH: 31 pg (ref 26.0–34.0)
MCHC: 31.1 g/dL (ref 30.0–36.0)
MCV: 99.7 fL (ref 80.0–100.0)
Platelets: 153 10*3/uL (ref 150–400)
RBC: 2.9 MIL/uL — ABNORMAL LOW (ref 4.22–5.81)
RDW: 16.6 % — ABNORMAL HIGH (ref 11.5–15.5)
WBC: 6.9 10*3/uL (ref 4.0–10.5)
nRBC: 0 % (ref 0.0–0.2)

## 2022-07-03 LAB — LIPID PANEL
Cholesterol: 103 mg/dL (ref 0–200)
HDL: 39 mg/dL — ABNORMAL LOW (ref >40)
LDL Cholesterol: 56 mg/dL (ref 0–99)
Total CHOL/HDL Ratio: 2.6 RATIO
Triglycerides: 38 mg/dL (ref <150)
VLDL: 8 mg/dL (ref 0–40)

## 2022-07-03 NOTE — Progress Notes (Signed)
Mobility Specialist Progress Note:   07/03/22 1030  Mobility  Activity Contraindicated/medical hold   Rn asking to hold d/t low HR. Will follow-up as time allows.   [facility] Air cabin crew Chat only

## 2022-07-03 NOTE — Progress Notes (Signed)
[facility] [place]  Subjective  - Plan to observe for one more night   Objective (!) 129/43 (!) 50 98 F (36.7 C) (Oral) 14 97%  Intake/Output Summary (Last 24 hours) at 07/03/2022 0645 Last data filed at 07/03/2022 0109 Gross per 24 hour  Intake 1378.83 ml  Output 1200 ml  Net 178.83 ml    Left neck soft with ecchymosis Right groin soft  Moving all extremities No neurologic deficits no facial droop or tongue deviation   Assessment/Planning: POD #1 left TCAR for asymptomatic carotid stenosis  Plan for mobility and observation of neck incision x 24 hours Continue current treatment and we will restart Coumadin today per pharmacy. Stable over all no acute distress    Mosetta Pigeon 07/03/2022 6:45 AM --  Laboratory Lab Results: Recent Labs    07/02/22 1454 07/03/22 0344  WBC 10.4 6.9  HGB 9.9* 9.0*  HCT 30.8* 28.9*  PLT 180 153   BMET Recent Labs    07/02/22 1454 07/03/22 0344  NA  --  140  K  --  3.9  CL  --  107  CO2  --  26  GLUCOSE  --  162*  BUN  --  47*  CREATININE 1.76* 1.62*  CALCIUM  --  8.7*    COAG Lab Results  Component Value Date   INR 1.2 07/02/2022   INR 1.8 (H) 06/27/2022   INR 1.1 05/28/2022   No results found for: "PTT"

## 2022-07-03 NOTE — Progress Notes (Signed)
Called to see pt about possible enlarging hematoma.  Pt in no distress.  He denies any trouble breathing or swallowing issues and says he ate breakfast without difficulty.    His neck is soft.    Discussed with [name] and he has asked nursing to hold pressure for 20 minutes as he would prefer to not have to take him back to the OR if possible.    Will check back in on him later this morning.  RN will call with any acute changes.    Doreatha Massed, PAC. 07/03/2022 9:08 AM

## 2022-07-03 NOTE — Progress Notes (Signed)
[facility]  Subjective  -no complaints.  Wants to eat breakfast.   Objective (!) 129/43 (!) 50 98 F (36.7 C) (Oral) 14 97%  Intake/Output Summary (Last 24 hours) at 07/03/2022 0705 Last data filed at 07/03/2022 1607 Gross per 24 hour  Intake 1378.83 ml  Output 1200 ml  Net 178.83 ml    Left neck incision for TCAR with notable hematoma that is soft with some ecchymosis Grossly neurologically intact Right groin incision clean dry and intact  Laboratory Lab Results: Recent Labs    07/02/22 1454 07/03/22 0344  WBC 10.4 6.9  HGB 9.9* 9.0*  HCT 30.8* 28.9*  PLT 180 153   BMET Recent Labs    07/02/22 1454 07/03/22 0344  NA  --  140  K  --  3.9  CL  --  107  CO2  --  26  GLUCOSE  --  162*  BUN  --  47*  CREATININE 1.76* 1.62*  CALCIUM  --  8.7*    COAG Lab Results  Component Value Date   INR 1.2 07/02/2022   INR 1.8 (H) 06/27/2022   INR 1.1 05/28/2022   No results found for: "PTT"  Assessment/Planning:  Postop day 1 status post left TCAR for high-grade asymptomatic stenosis.  This was an awake TCAR given he has severe pulmonary hypertension and cardiology felt he was too high risk for general anesthesia.  Did develop neck hematoma on the floor at TCAR incision and we held pressure for 20 minutes last night and this is much softer.  He is grossly neurologically intact.  He is not having any pain.  No significant change in neck hematoma overnight and does not require evacuation at this time.  I will plan to keep him another day to observe given his neck hematoma and also requiring 2 L of [place] oxygen had some bradycardia overnight.  Continue aspirin Plavix for carotid stent.  Hold Coumadin.  Cephus Shelling 07/03/2022 7:05 AM --

## 2022-07-03 NOTE — Progress Notes (Signed)
Attempted to wean patient off of 02. Patient was unable to maintain sats >92%. Patient placed back on O2 at 2L. Patient is a mouth breather and was agreeable to wearing the oxygen.  Will continue to monitor

## 2022-07-03 NOTE — Plan of Care (Signed)
  Problem: Activity: Goal: Ability to return to baseline activity level will improve Outcome: Progressing   

## 2022-07-03 NOTE — Progress Notes (Signed)
Came back to check on pt.  He is doing well.   He has been bradycardic and asymptomatic.  He is followed closely by cardiology and I have spoken with them and they will come by and evaluate pt.  Will hold off on ambulation until he has been seen by cardiology.  Will order EKG.   Shea Kapur, [facility] 07/03/2022 10:44 AM

## 2022-07-03 NOTE — Consult Note (Addendum)
Cardiology Consultation   Patient ID: Robert Baldwin MRN: [id number]; DOB: [birth year]  Admit date: 07/02/2022 Date of Consult: 07/03/2022  PCP:  Monico Blitz, [place] Cardiologist:  Rozann Lesches, MD        Patient Profile:   Robert Baldwin is a 81 y.o. 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  History of Present Illness:   Mr. Bistline was seen post-hosp by CHF team on 07/28 and HR 39, afib w/ slow VR, RBBB w/ QRS duration 134 ms.  He was seen by Dr Domenic Polite 08/24 preop for his TCAR and was doing well. Wt 182 lbs, HR 51. Not on BB/CCB, no med changes.  Pt admitted 09/11 for L TCAR. He had Enroute stent placed with pt awake. Local MAC used without general anesthesia. Pt tolerated well.   During surgery, HR 40s and asymptomatic. Post-op, pt had swelling to his neck and required direct pressure to control.  He had a hematoma in that area that is now softened, the ecchymosis is still visible.  Overnight, pt HR dropped into the 30s sustained.  He would slow briefly into the upper 20s, but came back up into the 30s.  Currently is heart rate is in the low 40s and he is asymptomatic with this.  Specifically he denies presyncope or syncope.  He does not get lightheaded or dizzy.  His volume status has been good since his last hospitalization.  He checks his weight daily.  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  He is able to walk around his house, walk around the office, and got to job sites.  He estimates that he can walk 100-200 feet without getting short of breath.  This is good for him.  He does not know what his heart rate does when he is walking.  However, here, his heart rate is gone up  into the 50s at times, presumably with activity.  Dr. Domenic Polite told him at 1 point that he would need a pacemaker someday.  He is willing to do it, but does not want it unless he absolutely has to have it.  However, he will abide by the doctor's recommendations.    Past Medical History:  Diagnosis Date   (HFpEF) heart failure with preserved ejection fraction (HCC)    Arthritis    Atrial fibrillation (HCC)    Carotid artery disease ([place])    Dysrhythmia    Hyperlipidemia    Hypertension    Pulmonary hypertension ([place])    Type 2 diabetes mellitus ([place])     Past Surgical History:  Procedure Laterality Date   Cataract surgery     COLONOSCOPY     HERNIA REPAIR     RIGHT HEART CATH N/A 05/28/2022   Procedure: RIGHT HEART CATH;  Surgeon: Larey Dresser, MD;  Location: [facility];  Service: Cardiovascular;  Laterality: N/A;   RIGHT/LEFT HEART CATH AND CORONARY ANGIOGRAPHY N/A 04/30/2022   Procedure: RIGHT/LEFT HEART CATH AND CORONARY ANGIOGRAPHY;  Surgeon: Nelva Bush, MD;  Location: [facility];  Service: Cardiovascular;  Laterality: N/A;   YAG LASER APPLICATION Right 17/49/4496   Procedure: YAG LASER APPLICATION;  Surgeon: Williams Che,  MD;  Location: [facility];  Service: Ophthalmology;  Laterality: Right;     Home Medications:  Prior to Admission medications   Medication Sig Start Date End Date Taking? Authorizing Provider  amLODipine (NORVASC) 5 MG tablet Take 1 tablet (5 mg total) by mouth daily. 05/28/22 05/28/23 Yes Larey Dresser, MD  aspirin EC 81 MG tablet Take 81 mg by mouth at bedtime. Swallow whole.   Yes [provider]  atorvastatin (LIPITOR) 40 MG tablet Take 40 mg by mouth at bedtime.   Yes [provider]  cholecalciferol (VITAMIN D) 25 MCG (1000 UNIT) tablet Take 1,000 Units by mouth in the morning and at bedtime.   Yes [provider]  clopidogrel (PLAVIX) 75 MG tablet Take 1 tablet (75 mg total) by mouth daily. 06/05/22   Yes Marty Heck, MD  dapagliflozin propanediol (FARXIGA) 10 MG TABS tablet Take 1 tablet (10 mg total) by mouth daily. 05/08/22  Yes Clegg, Amy D, NP  fish oil-omega-3 fatty acids 1000 MG capsule Take 1 g by mouth every evening.   Yes [provider]  macitentan (OPSUMIT) 10 MG tablet Take 10 mg by mouth daily.   Yes [provider]  Misc Natural Products (GLUCOS-CHONDROIT-MSM COMPLEX) TABS Take 2 tablets by mouth daily.   Yes [provider]  Multiple Vitamins-Minerals (MULTIVITAMIN WITH MINERALS) tablet Take 1 tablet by mouth in the morning.  Centrum Silver   Yes [provider]  niacin 500 MG tablet Take 500 mg by mouth in the morning.   Yes [provider]  sacubitril-valsartan (ENTRESTO) 24-26 MG Take 1 tablet by mouth 2 (two) times daily. 06/12/22  Yes Milford, Maricela Bo, FNP  tadalafil, PAH, (ADCIRCA) 20 MG tablet Take 2 tablets (40 mg total) by mouth daily. 05/04/22  Yes Rosita Fire, Brittainy M, PA-C  torsemide (DEMADEX) 20 MG tablet Take 40 mg by mouth every morning.   Yes [provider]  warfarin (COUMADIN) 5 MG tablet Take 5-7.5 mg by mouth See admin instructions. Managed by Dr. Trena Platt office Take 1.5 tablets (7.5 mg) by mouth in the evenings on Sundays & Thursdays. Take 1 tablet (5 mg) by mouth in the evening on Mondays, Tuesdays, Wednesdays, Fridays & Saturdays.   Yes [provider]    Inpatient Medications: Scheduled Meds:  amLODipine  5 mg Oral Daily   aspirin EC  81 mg Oral Q0600   atorvastatin  40 mg Oral QHS   clopidogrel  75 mg Oral Daily   dapagliflozin propanediol  10 mg Oral Daily   docusate sodium  100 mg Oral Daily   heparin  5,000 Units Subcutaneous Q8H   macitentan  10 mg Oral Daily   pantoprazole  40 mg Oral Daily   sacubitril-valsartan  1 tablet Oral BID   tadalafil (PAH)  40 mg Oral Daily   torsemide  40 mg Oral q morning   Continuous Infusions:  sodium chloride     sodium chloride 100  mL/hr at 07/02/22 1734   magnesium sulfate bolus IVPB     PRN Meds: sodium chloride, acetaminophen **OR** acetaminophen, alum & mag hydroxide-simeth, bisacodyl, guaiFENesin-dextromethorphan, hydrALAZINE, HYDROmorphone (DILAUDID) injection, labetalol, magnesium sulfate bolus IVPB, metoprolol tartrate, ondansetron, oxyCODONE, phenol, potassium chloride, senna-docusate  Allergies:    Allergies  Allergen Reactions   Other Rash    Bandaids    Social History:   Social History   Socioeconomic History   Marital status: Married    Spouse name: Not on file   Number  of children: Not on file   Years of education: Not on file   Highest education level: Not on file  Occupational History   Occupation: Full time    Comment: Contractor  Tobacco Use   Smoking status: Never   Smokeless tobacco: Never   Tobacco comments:    Never smoke 05/18/22  Vaping Use   Vaping Use: Never used  Substance and Sexual Activity   Alcohol use: No    Alcohol/week: 0.0 standard drinks of alcohol   Drug use: No   Sexual activity: Not on file  Other Topics Concern   Not on file  Social History Narrative   Not on file   Social Determinants of Health   Financial Resource Strain: Not on file  Food Insecurity: Not on file  Transportation Needs: Not on file  Physical Activity: Not on file  Stress: Not on file  Social Connections: Not on file  Intimate Partner Violence: Not on file    Family History:   Family History  Problem Relation Age of Onset   Stroke Other    Heart disease Other      ROS:  Please see the history of present illness.  All other ROS reviewed and negative.     Physical Exam/Data:   Vitals:   07/03/22 0400 07/03/22 0743 07/03/22 0858 07/03/22 1023  BP:  (!) 109/46 (!) 124/46 109/69  Pulse:  (!) 43 (!) 44 (!) 45  Resp:  _0 Temp:  98.7 F (37.1 C)    TempSrc:  Oral    SpO2: 97% 100% 99% 97%  Weight:      Height:        Intake/Output Summary (Last 24 hours) at  07/03/2022 1101 Last data filed at 07/03/2022 0744 Gross per 24 hour  Intake 1518.83 ml  Output 1200 ml  Net 318.83 ml      07/02/2022    7:23 AM 06/27/2022   10:26 AM 06/14/2022    9:32 AM  Last 3 Weights  Weight (lbs) 187 lb 187 lb 4.8 oz 182 lb 9.6 oz  Weight (kg) 84.823 kg 84.959 kg 82.827 kg     Body mass index is 29.29 kg/m.  General:  Well nourished, well developed, in no acute distress HEENT: normal for age, he has some swelling and ecchymosis on the left side of his neck at the surgical site. Neck: no JVD seen, unable to assess secondary to swelling and body habitus Vascular: No carotid bruits; Distal pulses 2+ bilaterally Cardiac:  normal S1, S2; irregular and slow rate and rhythm; soft murmur  Lungs: Decreased breath sounds bases with a few rales bilaterally, no wheezing, rhonchi Abd: soft, nontender, no hepatomegaly  Ext: no edema, right groin stable Musculoskeletal:  No deformities, BUE and BLE strength normal and equal Skin: warm and dry  Neuro:  CNs 2-12 intact, no focal abnormalities noted Psych:  Normal affect   EKG:  The EKG was personally reviewed and demonstrates:   07/28 ECG is atrial fib, slow VR, HR 39, RBBB w/ QRS 134 ms 09/12 ECG is atrial fib, slow VR, HR 41, RBBB w/ QRS 142 ms Telemetry:  Telemetry was personally reviewed and demonstrates:  atrial fib, slow VR, HR drops into the 20s briefly at times, sustains in the low 30s overnight.  Baseline heart rate is 40s  Relevant CV Studies:  R HEART CATH: 05/28/2022 1. PCWP remains elevated.  2. 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  PA pressure remains high, but  is significantly lower than on prior RHC.    Increase torsemide to 20 daily alternating with 40 daily, needs to start Opsumit, hold Entresto and spironolactone for now with K higher.  Right Heart Pressures RHC Procedural Findings: Hemodynamics (mmHg) RA mean 8 RV 81/7 PA 85/40, mean 41 PCWP mean 22, prominent v waves to  29  Oxygen saturations: PA 69% AO 97%  Cardiac Output (Fick) 5.55  Cardiac Index (Fick) 2.86 PVR 3.4 WU   R/L CARDIAC CATH: 04/30/2022 Conclusions: No angiographically significant coronary artery disease. Severely elevated pulmonary artery and right heart filling pressures (mean PA 58 mmHg, mean RA 25 mmHg). Mildly elevated left heart filling pressure (LVEDP 22 mmHg). Normal Fick cardiac output/index (CO 6.4 L/min, CI 3.1 L/min/m). Small, heavily calcified right radial artery precluding catheter advancement from this approach.  Recommend alternative access if future catheterizations are needed.   Recommendations: Admit to telemetry for consultation with advanced heart failure team and optimization of severe pulmonary hypertension and right heart failure. Start IV heparin 8 hours after hemostasis achieved at right femoral arteriotomy site.  Defer reinitiation of warfarin until it is clear that invasive procedures are not needed during this admission.  ECHO: 04/26/2022  1. Left ventricular ejection fraction, by estimation, is 70 to 75%. The  left ventricle has hyperdynamic function. The left ventricle has no  regional wall motion abnormalities. There is mild left ventricular  hypertrophy. Left ventricular diastolic  parameters are indeterminate. There is the interventricular septum is  flattened in systole and diastole, consistent with right ventricular  pressure and volume overload. The average left ventricular global  longitudinal strain is -24.0 %. The global  longitudinal strain is normal.   2. Right ventricular systolic function is moderately reduced. The right  ventricular size is moderately enlarged. Mildly increased right  ventricular wall thickness. There is severely elevated pulmonary artery  systolic pressure. The estimated right  ventricular systolic pressure is 818.2 mmHg.   3. Left atrial size was moderately dilated.   4. Right atrial size was severely dilated.    5. Right pleural effusion noted. Moderate pericardial effusion. The  pericardial effusion is posterior to the left ventricle and localized near  the right atrium. Small collection anterolaterally.   6. The mitral valve is abnormal. Trivial mitral valve regurgitation.  Moderate mitral annular calcification.   7. Tricuspid valve regurgitation is severe.   8. The aortic valve is tricuspid. Aortic valve regurgitation is not  visualized. Aortic valve sclerosis/calcification is present, without any  evidence of aortic stenosis. Aortic valve mean gradient measures 6.7 mmHg.   9. The inferior vena cava is dilated in size with <50% respiratory  variability, suggesting right atrial pressure of 15 mmHg.   Laboratory Data:  High Sensitivity Troponin:  No results for input(s): "TROPONINIHS" in the last 720 hours.   Chemistry Recent Labs  Lab 06/27/22 1114 07/02/22 1454 07/03/22 0344  NA 140  --  140  K 4.2  --  3.9  CL 97*  --  107  CO2 30  --  26  GLUCOSE 149*  --  162*  BUN 47*  --  47*  CREATININE 1.77* 1.76* 1.62*  CALCIUM 9.4  --  8.7*  GFRNONAA 38* 39* 43*  ANIONGAP 13  --  7    Recent Labs  Lab 06/27/22 1114 07/02/22 1454  PROT 7.0 6.2*  ALBUMIN 3.6 3.1*  AST 28 24  ALT 20 18  ALKPHOS 83 72  BILITOT 0.8 0.9   Lipids  Recent Labs  Lab 07/03/22 0344  CHOL 103  TRIG 38  HDL 39*  LDLCALC 56  CHOLHDL 2.6    Hematology Recent Labs  Lab 06/27/22 1114 07/02/22 1454 07/03/22 0344  WBC 8.3 10.4 6.9  RBC 3.45* 3.17* 2.90*  HGB 10.7* 9.9* 9.0*  HCT 34.1* 30.8* 28.9*  MCV 98.8 97.2 99.7  MCH 31.0 31.2 31.0  MCHC 31.4 32.1 31.1  RDW 16.7* 16.6* 16.6*  PLT 208 180 153   Thyroid No results for input(s): "TSH", "FREET4" in the last 168 hours.  BNPNo results for input(s): "BNP", "PROBNP" in the last 168 hours.  DDimer No results for input(s): "DDIMER" in the last 168 hours. Lab Results  Component Value Date   INR 1.2 07/02/2022   INR 1.8 (H) 06/27/2022   INR 1.1  05/28/2022     Radiology/Studies:  Structural Heart Procedure  Result Date: 07/02/2022 See surgical note for result.  HYBRID OR IMAGING ([facility] ONLY)  Result Date: 07/02/2022 There is no interpretation for this exam.  This order is for images obtained during a surgical procedure.  Please See "Surgeries" Tab for more information regarding the procedure.     Assessment and Plan:   Bradycardia: -His heart rate is chronically low, atrial fibrillation with slow VR. -Obstructive sleep apnea may play a role, testing has not been completed -It is also possible that the hematoma around his carotid, pressure to control the hematoma, and sedating medications required for the surgery could have played a role -At this time, he is not having symptoms related to this. -He is on anticoagulation with Coumadin, restart per Dr. Carlis Abbott -Discuss with MD if he needs to go ahead and get an EP referral, or wait and have him follow-up with Dr. Domenic Polite  2.  OSA -Dr. Aundra Dubin ordered a home sleep study, but the patient had trouble with the equipment and could not get it to work. -An in-house split-night study is ordered.  3. 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 -He weighs himself daily and tries to be compliant with a low-sodium diet. -Home weight of 181 pounds is a dry weight for him -His weight yesterday was 187 pounds, get a weight today -Continue his home dose of torsemide and potassium supplement    Risk Assessment/Risk Scores:       New York Heart Association (NYHA) Functional Class NYHA Class II  CHA2DS2-VASc Score = 6   This indicates a 9.7% annual risk of stroke. The patient's score is based upon: CHF History: 1 HTN History: 1 Diabetes History: 1 Stroke History: 0 Vascular Disease History: 1 Age Score: 2 Gender Score: 0   For questions or updates, please  contact [facility] Please consult www.Amion.com for contact info under    Signed, Rosaria Ferries, PA-C  07/03/2022 11:01 AM  Patient seen, examined. Available data reviewed. Agree with findings, assessment, and plan as outlined by Rosaria Ferries, PA-C.  The patient is independently interviewed and examined.  He is an alert, oriented, elderly male in no distress.  HEENT is normal.  Neck with hematoma on the left side, appears soft.  Lungs are clear bilaterally, heart is irregular and slow without murmur or gallop, abdomen is soft and nontender, extremities have trace edema.  EKG is reviewed and shows slow atrial fibrillation with a heart rate of 41 bpm and stable right bundle branch block pattern.  The patient has longstanding slow atrial fibrillation.  He has become more bradycardic in the hospital, especially at nighttime.  I reviewed all of his telemetry and his longest R-R interval was about 2.1 seconds.  He does not have any pathologic pauses or evidence of high-grade heart block.  He has not had dizziness or syncope prior to his carotid surgery.  He has not had symptoms since surgery, but he has been on bedrest.  I went back and reviewed EKGs over the past few years and he has had heart rates in the 30s and 40s dating back at least to 2021.  This has been managed conservatively with observation and I think this is a reasonable approach moving forward.  I do not see any absolute indication for permanent pacing at this point.  We will plan to arrange close outpatient follow-up with Dr. Domenic Polite for continued clinical surveillance.  The patient has not been given any AV nodal blocking agents during his hospitalization and obviously should avoid anything that could slow his heart rate further.  Sherren Mocha, M.D. 07/03/2022 12:29 PM

## 2022-07-03 NOTE — Anesthesia Postprocedure Evaluation (Signed)
Anesthesia Post Note  Patient: SPYROS WINCH  Procedure(s) Performed: Transcarotid Artery Revascularization (Left: Neck) ULTRASOUND GUIDANCE FOR VASCULAR ACCESS (Right: Groin)     Patient location during evaluation: PACU Anesthesia Type: MAC Level of consciousness: awake and alert Pain management: pain level controlled Vital Signs Assessment: post-procedure vital signs reviewed and stable Respiratory status: spontaneous breathing, nonlabored ventilation, respiratory function stable and patient connected to nasal cannula oxygen Cardiovascular status: stable and blood pressure returned to baseline Postop Assessment: no apparent nausea or vomiting Anesthetic complications: no   No notable events documented.  Last Vitals:  Vitals:   07/03/22 0743 07/03/22 0858  BP: (!) 109/46 (!) 124/46  Pulse: (!) 43 (!) 44  Resp: 15 14  Temp: 37.1 C   SpO2: 100% 99%    Last Pain:  Vitals:   07/03/22 0743  TempSrc: Oral  PainSc:                  [place] S

## 2022-07-03 NOTE — Progress Notes (Signed)
Patient's neck swollen and bruised but soft to touch. Vitals stable.  Robert Baldwin

## 2022-07-03 NOTE — Progress Notes (Addendum)
This RN noticed patient's neck more swollen above incision. Patient expressed feeling tight but no issues breathing, swallowing or pain. Vital signs stable. Charge nurse assessed and we paged the MD. PA came to room to assess and pressure held for 20 minutes. This RN will continue to monitor hematoma. Patient left in room with neck soft and no concerns of pain. Call bell within reach.  Robert Baldwin

## 2022-07-04 MED ORDER — OXYCODONE HCL 5 MG PO TABS: 5.0000 mg | ORAL_TABLET | Freq: Four times a day (QID) | ORAL | 0 refills | Status: DC | PRN

## 2022-07-04 NOTE — Progress Notes (Addendum)
[facility]  Subjective  - Feeling much better, no SOB, no new issues with left neck swelling   Objective (!) 135/33 (!) 46 (!) 97.4 F (36.3 C) (Oral) 19 99%  Intake/Output Summary (Last 24 hours) at 07/04/2022 0648 Last data filed at 07/04/2022 0400 Gross per 24 hour  Intake 1210 ml  Output 1800 ml  Net -590 ml    Left neck incision without frank hematoma, soft Speech clear, no neurologic deficits Right groin vein stick site is clean and dry Lungs non labored breathing   Assessment/Planning: POD # 2 status post left TCAR for high-grade asymptomatic stenosis.  This was an awake TCAR given he has severe pulmonary hypertension and cardiology felt he was too high risk for general anesthesia.  Did develop neck hematoma on the floor at TCAR incision and we held pressure for 20 minutes.  This am he is doing well without frank hematoma, speech clear, no neurologic deficits.   Plan for discharge today in stable condition.  We will have him hold his coumadin until he returns to our office Tuesday 07/10/22.  We do not want to risk Hematoma and emergent surgery due to his  severe pulmonary hypertension and cardiology felt he was too high risk for general anesthesia.   Patient agrees with this plan.   Mosetta Pigeon 07/04/2022 6:48 AM --  Laboratory Lab Results: Recent Labs    07/02/22 1454 07/03/22 0344  WBC 10.4 6.9  HGB 9.9* 9.0*  HCT 30.8* 28.9*  PLT 180 153   BMET Recent Labs    07/02/22 1454 07/03/22 0344  NA  --  140  K  --  3.9  CL  --  107  CO2  --  26  GLUCOSE  --  162*  BUN  --  47*  CREATININE 1.76* 1.62*  CALCIUM  --  8.7*    COAG Lab Results  Component Value Date   INR 1.2 07/02/2022   INR 1.8 (H) 06/27/2022   INR 1.1 05/28/2022   No results found for: "PTT"  I have seen and evaluated the patient. I agree with the PA note as documented above.  Postop day 2 status post left TCAR for an asymptomatic  high-grade stenosis.  We kept him an extra day due to left neck hematoma and also ongoing bradycardia.  Neck hematoma is very soft with no change in the last 24 hours.  No pain or trouble swalloing.  Cardiology saw the patient and recommended outpatient follow-up for his asymptomatic bradycardia.  Patient feels ready for discharge.  We will plan discharge home.  Discussed aspirin Plavix for carotid stent.  I am going to hold his Coumadin as I think he would be very high risk for requiring return to the OR if we let him resume that at this time.  I will see him next week in the office for incision check and then hopefully can resume his Coumadin at that time.  Again cardiologist felt he was too high risk for general anesthesia due to severe pulmonary hypertension and he got an awake TCAR.  Cephus Shelling, MD [facility] Office: [phone]

## 2022-07-04 NOTE — Progress Notes (Signed)
Pt is alert and fully oriented x 4. His BP remains stable. Atrial fib on the monitor, HR is at 30s-40s at night,but shortly drops to 26. Pt has been asymptomatic with low HR. MD is aware.   Left neck incision is dry and clean, no drainage. The hematoma with swelling around his left neck incision has not increased the size. MD made aware per RN day shift reported. Pt has no breathing or swallowing difficulties. No acute distress. He is neurological intact, no deficits  are noted.   Pt's wife called me by phone in the evening. Update was given. All questions were answered. She expressed appreciations.   We will continue to monitor.    Filiberto Pinks, RN

## 2022-07-04 NOTE — Progress Notes (Addendum)
Mobility Specialist Progress Note:   07/04/22 0942  Mobility  Activity Ambulated with assistance in hallway  Level of Assistance Contact guard assist, steadying assist  Assistive Device None  Distance Ambulated (ft) 200 ft  Activity Response Tolerated well  $Mobility charge 1 Mobility   Pt received EOB willing to participate in mobility. No complaints of pain. Left EOB with call bell in reach and all needs met.   Pre- Mobility:  64 HR During Mobility:80 HR Post Mobility:   73 HR   Olia Hinderliter Air traffic controller only

## 2022-07-04 NOTE — Progress Notes (Signed)
Mobility Specialist Progress Note:   07/04/22 1115  Mobility  Activity Ambulated with assistance to bathroom  Level of Assistance Minimal assist, patient does 75% or more  Assistive Device None  Distance Ambulated (ft) 20 ft  Activity Response Tolerated well  $Mobility charge 1 Mobility   Pt received in bathroom needing to get to bed. No complaints of pain. MinA for peri-care. Left Eob with call bell in reach and all needs met.   [facility] Surveyor, mining Chat only

## 2022-07-04 NOTE — Progress Notes (Signed)
PHARMACIST LIPID MONITORING   Robert Baldwin is a 81 y.o. male admitted on 07/02/2022 with carotid stenosis.  Pharmacy has been consulted to optimize lipid-lowering therapy with the indication of secondary prevention for clinical ASCVD.  Recent Labs:  Lipid Panel (last 6 months):   Lab Results  Component Value Date   CHOL 103 07/03/2022   TRIG 38 07/03/2022   HDL 39 (L) 07/03/2022   CHOLHDL 2.6 07/03/2022   VLDL 8 07/03/2022   LDLCALC 56 07/03/2022    Hepatic function panel (last 6 months):   Lab Results  Component Value Date   AST 24 07/02/2022   ALT 18 07/02/2022   ALKPHOS 72 07/02/2022   BILITOT 0.9 07/02/2022   BILIDIR 0.2 07/02/2022   IBILI 0.7 07/02/2022    SCr (since admission):   Serum creatinine: 1.62 mg/dL (H) 61/68/37 2902 Estimated creatinine clearance: 37.9 mL/min (A)  Current therapy and lipid therapy tolerance Current lipid-lowering therapy: Atorvastatin 40mg /d Documented or reported allergies or intolerances to lipid-lowering therapies (if applicable): None   Plan:    1.Statin intensity (high intensity recommended for all patients regardless of the LDL):  No statin changes. The patient is already on a high intensity statin.  2.Add ezetimibe (if any one of the following):   Not indicated at this time.  3.Refer to lipid clinic:   No  4.Follow-up with:  Primary care provider - , MD  5.Follow-up labs after discharge:  No changes in lipid therapy, repeat a lipid panel in one year.     Kirstie Peri, PharmD Clinical Pharmacist **Pharmacist phone directory can now be found on [email or web address] ([email or web address]).  Listed under [facility].

## 2022-07-05 ENCOUNTER — Other Ambulatory Visit: Payer: Self-pay

## 2022-07-06 NOTE — Discharge Summary (Signed)
[facility] Discharge Summary   Patient ID:  Robert Baldwin MRN: [id number] DOB/AGE: 1941/07/16 81 y.o.  Admit date: 07/02/2022 Discharge date: 07/04/22 Date of Surgery: 07/02/2022 Surgeon: Surgeon(s): Cephus Shelling, MD  Admission Diagnosis: Carotid stenosis [I65.29]  Discharge Diagnoses:  Carotid stenosis [I65.29]  Secondary Diagnoses: Past Medical History:  Diagnosis Date   (HFpEF) heart failure with preserved ejection fraction (HCC)    Arthritis    Atrial fibrillation (HCC)    Carotid artery disease (HCC)    Dysrhythmia    Hyperlipidemia    Hypertension    Pulmonary hypertension (HCC)    Type 2 diabetes mellitus (HCC)     Procedure(s): Transcarotid Artery Revascularization ULTRASOUND GUIDANCE FOR VASCULAR ACCESS  Discharged Condition: stable  HPI:  Patient was initially seen by my partner Dr. Arbie Cookey in [place] with asymptomatic bilateral carotid disease.  He was scheduled for left carotid endarterectomy. Robert Baldwin is a 81 y.o. male, with history of atrial fibrillation on Coumadin, hypertension, hyperlipidemia, diabetes, congestive heart failure with RV failure and severe pulmonary hypertension that presents to discuss carotid intervention.   He was seen pre-op and Cardiology recommended  left TCAR awake under local anesthesia.   Hospital Course:  Robert Baldwin is a 81 y.o. male is S/P  Procedure(s): Transcarotid Artery Revascularization ULTRASOUND GUIDANCE FOR VASCULAR ACCESS   Left neck incision for TCAR with notable hematoma that is soft with some ecchymosis, mild hematoma.  Pressure was help for 20 min.  He was observed for an additional 24 hours.   Grossly neurologically intact Right groin incision clean dry and intact Plan for discharge 07/04/22 in stable condition.  We will have him hold his coumadin until he returns to our office Tuesday 07/10/22.  We do not want to risk Hematoma and emergent surgery due to his  severe pulmonary  hypertension and cardiology felt he was too high risk for general anesthesia.   Patient agrees with this plan. Discharge on ASA, Plavix and Statin.      Significant Diagnostic Studies: CBC Lab Results  Component Value Date   WBC 6.9 07/03/2022   HGB 9.0 (L) 07/03/2022   HCT 28.9 (L) 07/03/2022   MCV 99.7 07/03/2022   PLT 153 07/03/2022    BMET    Component Value Date/Time   NA 140 07/03/2022 0344   K 3.9 07/03/2022 0344   CL 107 07/03/2022 0344   CO2 26 07/03/2022 0344   GLUCOSE 162 (H) 07/03/2022 0344   BUN 47 (H) 07/03/2022 0344   CREATININE 1.62 (H) 07/03/2022 0344   CALCIUM 8.7 (L) 07/03/2022 0344   GFRNONAA 43 (L) 07/03/2022 0344   COAG Lab Results  Component Value Date   INR 1.2 07/02/2022   INR 1.8 (H) 06/27/2022   INR 1.1 05/28/2022     Disposition:  Discharge to :Home Discharge Instructions     Call MD for:  redness, tenderness, or signs of infection (pain, swelling, bleeding, redness, odor or green/yellow discharge around incision site)   Complete by: As directed    Call MD for:  severe or increased pain, loss or decreased feeling  in affected limb(s)   Complete by: As directed    Call MD for:  temperature >100.5   Complete by: As directed    Resume previous diet   Complete by: As directed       Allergies as of 07/04/2022       Reactions   Other Rash   Bandaids  Medication List     STOP taking these medications    warfarin 5 MG tablet Commonly known as: COUMADIN       TAKE these medications    amLODipine 5 MG tablet Commonly known as: NORVASC Take 1 tablet (5 mg total) by mouth daily.   aspirin EC 81 MG tablet Take 81 mg by mouth at bedtime. Swallow whole.   atorvastatin 40 MG tablet Commonly known as: LIPITOR Take 40 mg by mouth at bedtime.   cholecalciferol 25 MCG (1000 UNIT) tablet Commonly known as: VITAMIN D3 Take 1,000 Units by mouth in the morning and at bedtime.   clopidogrel 75 MG tablet Commonly known  as: PLAVIX Take 1 tablet (75 mg total) by mouth daily.   dapagliflozin propanediol 10 MG Tabs tablet Commonly known as: FARXIGA Take 1 tablet (10 mg total) by mouth daily.   Entresto 24-26 MG Generic drug: sacubitril-valsartan Take 1 tablet by mouth 2 (two) times daily.   fish oil-omega-3 fatty acids 1000 MG capsule Take 1 g by mouth every evening.   Glucos-Chondroit-MSM Complex Tabs Take 2 tablets by mouth daily.   multivitamin with minerals tablet Take 1 tablet by mouth in the morning.  Centrum Silver   niacin 500 MG tablet Commonly known as: VITAMIN B3 Take 500 mg by mouth in the morning.   Opsumit 10 MG tablet Generic drug: macitentan Take 10 mg by mouth daily.   oxyCODONE 5 MG immediate release tablet Commonly known as: Oxy IR/ROXICODONE Take 1 tablet (5 mg total) by mouth every 6 (six) hours as needed for moderate pain.   tadalafil (PAH) 20 MG tablet Commonly known as: ADCIRCA Take 2 tablets (40 mg total) by mouth daily.   torsemide 20 MG tablet Commonly known as: DEMADEX Take 40 mg by mouth every morning.       Verbal and written Discharge instructions given to the patient. Wound care per Discharge AVS  Follow-up Information     Cephus Shelling, MD Follow up in 4 week(s).   Specialty: Vascular Surgery Why: Office will call you to arrange your appt (sent) Contact information: [address] [place] [address] [phone]         Cephus Shelling, MD Follow up on 07/10/2022.   Specialty: Vascular Surgery Why: Office will call you to arrange your appt (sent) Contact information: [address] [place] [address] [phone]                 Signed: Mosetta Pigeon 07/06/2022, 8:22 AM --- For VQI Registry use --- Instructions: Press F2 to tab through selections.  Delete question if not applicable.   Modified Rankin score at D/C (0-6): Rankin Score=0  IV medication needed for:  1. Hypertension: No 2. Hypotension:  No  Post-op Complications: No  1. Post-op CVA or TIA: No  If yes: Event classification (right eye, left eye, right cortical, left cortical, verterobasilar, other):   If yes: Timing of event (intra-op, <6 hrs post-op, >=6 hrs post-op, unknown):   2. CN injury: No  If yes: CN  injuried   3. Myocardial infarction: No  If yes: Dx by (EKG or clinical, Troponin):   4.  CHF: No  5.  Dysrhythmia (new): No  6. Wound infection: No  7. Reperfusion symptoms: No  8. Return to OR: No  If yes: return to OR for (bleeding, neurologic, other CEA incision, other):   Discharge medications: Statin use:  Yes ASA use:  Yes Beta blocker use:  No  for medical reason not indicated ACE-Inhibitor use:  No  for medical reason not indicated P2Y12 Antagonist use: [ ]  None, [ x] Plavix, [ ]  Plasugrel, [ ]  Ticlopinine, [ ]  Ticagrelor, [ ]  Other, [ ]  No for medical reason, [ ]  Non-compliant, [ ]  Not-indicated Anti-coagulant use:  [ x] None, [ ]  Warfarin, [ ]  Rivaroxaban, [ ]  Dabigatran, [ ]  Other, [ ]  No for medical reason, [ ]  Non-compliant, [ ]  Not-indicated

## 2022-07-10 ENCOUNTER — Ambulatory Visit (INDEPENDENT_AMBULATORY_CARE_PROVIDER_SITE_OTHER): Payer: Medicare PPO | Admitting: Vascular Surgery

## 2022-07-10 ENCOUNTER — Encounter: Payer: Self-pay | Admitting: Vascular Surgery

## 2022-07-10 VITALS — BP 177/54 | HR 53 | Temp 97.9°F | Resp 16 | Ht 62.0 in | Wt 190.0 lb

## 2022-07-10 DIAGNOSIS — I6523 Occlusion and stenosis of bilateral carotid arteries: Secondary | ICD-10-CM

## 2022-07-10 NOTE — Progress Notes (Signed)
Patient name: Robert Baldwin MRN: [id number] DOB: 1941-10-13 Sex: male  REASON FOR VISIT: Post-op check after TCAR  HPI: Robert Baldwin is a 81 y.o. male with history of atrial fibrillation on Coumadin as well as severe pulmonary hypertension that presents for 1 week postop check after left TCAR on 07/02/2022 for an asymptomatic high grade stenosis.  This was an awake TCAR given he could not get general anesthesia due to his severe pulmonary hypertension.  Postop course was complicated by hematoma of the left neck incision that was managed nonoperatively.  We held his Coumadin at discharge but instructed him to stay on aspirin Plavix for his stent.  Reports no neurologic events since discharge.  Neck hematoma is getting better and is much softer.  Past Medical History:  Diagnosis Date   (HFpEF) heart failure with preserved ejection fraction (HCC)    Arthritis    Atrial fibrillation (HCC)    Carotid artery disease ([place])    Dysrhythmia    Hyperlipidemia    Hypertension    Pulmonary hypertension ([place])    Type 2 diabetes mellitus ([place])     Past Surgical History:  Procedure Laterality Date   Cataract surgery     COLONOSCOPY     HERNIA REPAIR     RIGHT HEART CATH N/A 05/28/2022   Procedure: RIGHT HEART CATH;  Surgeon: Larey Dresser, MD;  Location: [facility];  Service: Cardiovascular;  Laterality: N/A;   RIGHT/LEFT HEART CATH AND CORONARY ANGIOGRAPHY N/A 04/30/2022   Procedure: RIGHT/LEFT HEART CATH AND CORONARY ANGIOGRAPHY;  Surgeon: Nelva Bush, MD;  Location: [facility];  Service: Cardiovascular;  Laterality: N/A;   TRANSCAROTID ARTERY REVASCULARIZATION  Left 07/02/2022   Procedure: Transcarotid Artery Revascularization;  Surgeon: Marty Heck, MD;  Location: [facility];  Service: Vascular;  Laterality: Left;   ULTRASOUND GUIDANCE FOR VASCULAR ACCESS Right 07/02/2022   Procedure: ULTRASOUND GUIDANCE FOR VASCULAR ACCESS;  Surgeon: Marty Heck, MD;  Location:  [facility];  Service: Vascular;  Laterality: Right;   YAG LASER APPLICATION Right 67/61/9509   Procedure: YAG LASER APPLICATION;  Surgeon: Williams Che, MD;  Location: [facility];  Service: Ophthalmology;  Laterality: Right;    Family History  Problem Relation Age of Onset   Stroke Other    Heart disease Other     SOCIAL HISTORY: Social History   Tobacco Use   Smoking status: Never   Smokeless tobacco: Never   Tobacco comments:    Never smoke 05/18/22  Substance Use Topics   Alcohol use: No    Alcohol/week: 0.0 standard drinks of alcohol    Allergies  Allergen Reactions   Other Rash    Bandaids    Current Outpatient Medications  Medication Sig Dispense Refill   amLODipine (NORVASC) 5 MG tablet Take 1 tablet (5 mg total) by mouth daily. 30 tablet 11   aspirin EC 81 MG tablet Take 81 mg by mouth at bedtime. Swallow whole.     atorvastatin (LIPITOR) 40 MG tablet Take 40 mg by mouth at bedtime.     cholecalciferol (VITAMIN D) 25 MCG (1000 UNIT) tablet Take 1,000 Units by mouth in the morning and at bedtime.     clopidogrel (PLAVIX) 75 MG tablet Take 1 tablet (75 mg total) by mouth daily. 30 tablet 6   dapagliflozin propanediol (FARXIGA) 10 MG TABS tablet Take 1 tablet (10 mg total) by mouth daily. 30 tablet 5   fish oil-omega-3 fatty acids 1000 MG  capsule Take 1 g by mouth every evening.     macitentan (OPSUMIT) 10 MG tablet Take 10 mg by mouth daily.     Misc Natural Products (GLUCOS-CHONDROIT-MSM COMPLEX) TABS Take 2 tablets by mouth daily.     Multiple Vitamins-Minerals (MULTIVITAMIN WITH MINERALS) tablet Take 1 tablet by mouth in the morning.  Centrum Silver     niacin 500 MG tablet Take 500 mg by mouth in the morning.     sacubitril-valsartan (ENTRESTO) 24-26 MG Take 1 tablet by mouth 2 (two) times daily. 60 tablet 11   tadalafil, PAH, (ADCIRCA) 20 MG tablet Take 2 tablets (40 mg total) by mouth daily. 60 tablet 5   torsemide (DEMADEX) 20 MG tablet Take 40 mg by mouth every  morning.     oxyCODONE (OXY IR/ROXICODONE) 5 MG immediate release tablet Take 1 tablet (5 mg total) by mouth every 6 (six) hours as needed for moderate pain. (Patient not taking: Reported on 07/10/2022) 12 tablet 0   No current facility-administered medications for this visit.    REVIEW OF SYSTEMS:  [X]  denotes positive finding, [ ]  denotes negative finding Cardiac  Comments:  Chest pain or chest pressure:    Shortness of breath upon exertion:    Short of breath when lying flat:    Irregular heart rhythm:        Vascular    Pain in calf, thigh, or hip brought on by ambulation:    Pain in feet at night that wakes you up from your sleep:     Blood clot in your veins:    Leg swelling:         Pulmonary    Oxygen at home:    Productive cough:     Wheezing:         Neurologic    Sudden weakness in arms or legs:     Sudden numbness in arms or legs:     Sudden onset of difficulty speaking or slurred speech:    Temporary loss of vision in one eye:     Problems with dizziness:         Gastrointestinal    Blood in stool:     Vomited blood:         Genitourinary    Burning when urinating:     Blood in urine:        Psychiatric    Major depression:         Hematologic    Bleeding problems:    Problems with blood clotting too easily:        Skin    Rashes or ulcers:        Constitutional    Fever or chills:      PHYSICAL EXAM: Vitals:   07/10/22 1251 07/10/22 1255  BP: (!) 153/52 (!) 177/54  Pulse: (!) 53 (!) 53  Resp: 16   Temp: 97.9 F (36.6 C)   TempSrc: Temporal   SpO2: 96%   Weight: 190 lb (86.2 kg)   Height: 5\' 2"  (1.575 m)     GENERAL: The patient is a well-nourished male, in no acute distress. The vital signs are documented above. Neck hematoma much better and soft, incision healing above left clavicle PULMONARY: No respiratory distress. ABDOMEN: Soft and non-tender. MUSCULOSKELETAL: There are no major deformities or cyanosis. NEUROLOGIC: No focal  weakness or paresthesias are detected.  Cranial nerves II through XII grossly intact PSYCHIATRIC: The patient has a normal affect.  DATA:   None  Assessment/Plan:   81 y.o. male with history of atrial fibrillation on Coumadin as well as severe pulmonary hypertension that presents for 1 week postop check after left TCAR on 07/02/2022 for an asymptomatic high grade stenosis.  This was an awake TCAR given he could not get general anesthesia due to his severe pulmonary hypertension.  Postop course was complicated by hematoma at the neck incision that was managed nonoperatively.  We did hold his Coumadin at discharge but instructed to stay on aspirin Plavix for his stent.  He remains neurologically intact today.  His neck looks much better.  Discussed he stay on aspirin Plavix for the carotid stent.  I think it is probably fine for him to resume his Coumadin but we will wait a couple more days to be extra safe given his risk for general anesthesia.  I will see him on 07/31/2022 for scheduled appointment for carotid duplex and surveillance of his new carotid stent.  We briefly discussed intervention on the contralateral carotid.  We will allow him to completely recover from this operation first and see how his duplex looks next month.   Marty Heck, MD [facility] [place] Office: [phone]

## 2022-07-12 DIAGNOSIS — Z713 Dietary counseling and surveillance: Secondary | ICD-10-CM | POA: Diagnosis not present

## 2022-07-12 DIAGNOSIS — Z6833 Body mass index (BMI) 33.0-33.9, adult: Secondary | ICD-10-CM | POA: Diagnosis not present

## 2022-07-12 DIAGNOSIS — Z299 Encounter for prophylactic measures, unspecified: Secondary | ICD-10-CM | POA: Diagnosis not present

## 2022-07-12 DIAGNOSIS — I10 Essential (primary) hypertension: Secondary | ICD-10-CM | POA: Diagnosis not present

## 2022-07-23 ENCOUNTER — Ambulatory Visit (HOSPITAL_COMMUNITY)
Admission: RE | Admit: 2022-07-23 | Discharge: 2022-07-23 | Disposition: A | Discharge status: Home / Self Care | Payer: Medicare PPO | Source: Ambulatory Visit | Attending: Cardiology | Admitting: Cardiology

## 2022-07-23 VITALS — BP 160/70 | HR 54 | Wt 198.2 lb

## 2022-07-23 DIAGNOSIS — Z8249 Family history of ischemic heart disease and other diseases of the circulatory system: Secondary | ICD-10-CM | POA: Insufficient documentation

## 2022-07-23 DIAGNOSIS — E1122 Type 2 diabetes mellitus with diabetic chronic kidney disease: Secondary | ICD-10-CM | POA: Insufficient documentation

## 2022-07-23 DIAGNOSIS — Z7901 Long term (current) use of anticoagulants: Secondary | ICD-10-CM | POA: Diagnosis not present

## 2022-07-23 DIAGNOSIS — I130 Hypertensive heart and chronic kidney disease with heart failure and stage 1 through stage 4 chronic kidney disease, or unspecified chronic kidney disease: Secondary | ICD-10-CM | POA: Diagnosis not present

## 2022-07-23 DIAGNOSIS — I4510 Unspecified right bundle-branch block: Secondary | ICD-10-CM | POA: Insufficient documentation

## 2022-07-23 DIAGNOSIS — Z79899 Other long term (current) drug therapy: Secondary | ICD-10-CM | POA: Diagnosis not present

## 2022-07-23 DIAGNOSIS — E785 Hyperlipidemia, unspecified: Secondary | ICD-10-CM | POA: Insufficient documentation

## 2022-07-23 DIAGNOSIS — I5033 Acute on chronic diastolic (congestive) heart failure: Secondary | ICD-10-CM | POA: Diagnosis not present

## 2022-07-23 DIAGNOSIS — I2721 Secondary pulmonary arterial hypertension: Secondary | ICD-10-CM | POA: Insufficient documentation

## 2022-07-23 DIAGNOSIS — Z7984 Long term (current) use of oral hypoglycemic drugs: Secondary | ICD-10-CM | POA: Diagnosis not present

## 2022-07-23 DIAGNOSIS — I272 Pulmonary hypertension, unspecified: Secondary | ICD-10-CM | POA: Diagnosis not present

## 2022-07-23 DIAGNOSIS — Z7982 Long term (current) use of aspirin: Secondary | ICD-10-CM | POA: Insufficient documentation

## 2022-07-23 DIAGNOSIS — Z7902 Long term (current) use of antithrombotics/antiplatelets: Secondary | ICD-10-CM | POA: Insufficient documentation

## 2022-07-23 DIAGNOSIS — I4821 Permanent atrial fibrillation: Secondary | ICD-10-CM | POA: Insufficient documentation

## 2022-07-23 DIAGNOSIS — N183 Chronic kidney disease, stage 3 unspecified: Secondary | ICD-10-CM | POA: Insufficient documentation

## 2022-07-23 DIAGNOSIS — I5032 Chronic diastolic (congestive) heart failure: Secondary | ICD-10-CM | POA: Insufficient documentation

## 2022-07-23 DIAGNOSIS — I50812 Chronic right heart failure: Secondary | ICD-10-CM

## 2022-07-23 LAB — CBC
HCT: 32.4 % — ABNORMAL LOW (ref 39.0–52.0)
Hemoglobin: 10.3 g/dL — ABNORMAL LOW (ref 13.0–17.0)
MCH: 31.4 pg (ref 26.0–34.0)
MCHC: 31.8 g/dL (ref 30.0–36.0)
MCV: 98.8 fL (ref 80.0–100.0)
Platelets: 179 10*3/uL (ref 150–400)
RBC: 3.28 MIL/uL — ABNORMAL LOW (ref 4.22–5.81)
RDW: 16.1 % — ABNORMAL HIGH (ref 11.5–15.5)
WBC: 6 10*3/uL (ref 4.0–10.5)
nRBC: 0 % (ref 0.0–0.2)

## 2022-07-23 LAB — BASIC METABOLIC PANEL
Anion gap: 7 (ref 5–15)
BUN: 50 mg/dL — ABNORMAL HIGH (ref 8–23)
CO2: 27 mmol/L (ref 22–32)
Calcium: 8.7 mg/dL — ABNORMAL LOW (ref 8.9–10.3)
Chloride: 105 mmol/L (ref 98–111)
Creatinine, Ser: 1.7 mg/dL — ABNORMAL HIGH (ref 0.61–1.24)
GFR, Estimated: 40 mL/min — ABNORMAL LOW (ref >60)
Glucose, Bld: 135 mg/dL — ABNORMAL HIGH (ref 70–99)
Potassium: 4.3 mmol/L (ref 3.5–5.1)
Sodium: 139 mmol/L (ref 135–145)

## 2022-07-23 LAB — BRAIN NATRIURETIC PEPTIDE: B Natriuretic Peptide: 631.3 pg/mL — ABNORMAL HIGH (ref 0.0–100.0)

## 2022-07-23 MED ORDER — SPIRONOLACTONE 25 MG PO TABS: 12.5000 mg | ORAL_TABLET | Freq: Every day | ORAL | 3 refills | Status: DC

## 2022-07-23 MED ORDER — ENTRESTO 24-26 MG PO TABS: 1.0000 | ORAL_TABLET | Freq: Two times a day (BID) | ORAL | 11 refills | Status: DC

## 2022-07-23 MED ORDER — TORSEMIDE 20 MG PO TABS: 40.0000 mg | ORAL_TABLET | Freq: Two times a day (BID) | ORAL | 3 refills | Status: DC

## 2022-07-23 NOTE — Progress Notes (Signed)
Advanced Heart Failure Clinic Note   PCP: Monico Blitz, MD Primary Cardiologist: Dr. Domenic Polite HF Cardiologist: Dr. Aundra Dubin  HPI: Robert Baldwin is a 81 y.o. male with history of b/l carotid artery stenosis, DM II, HTN, HLD, permanent atrial fibrillation. He follows with Dr. Domenic Polite in the cardiology clinic.    Saw [facility] for carotid artery stenosis in 6/23. Was noting more dyspnea with exertion and lower extremity edema for about a month. He was subsequently started on po lasix 20 mg daily.   He was seen by Dr. Domenic Polite in the clinic 04/26/22 for evaluation of recent symptoms and pre-operative evaluation in preparation for left CEA followed by staged right CEA. Echo day of the visit showed EF 70-75%, hyperdynamic LV function, mild LVH, interventricular septum flattened in systole and diastole consistent with RV pressure and volume overload, RV moderately enlarged and moderately reduced, RVSP 120 mmHg, moderate LAE, severe RAE, moderate pericardial effusion, severe TR, dilated IVC. Patient was scheduled for outpatient R/LHC to better assess hemodynamics and further characterize pulmonary hypertension.    [facility] 7/23 showed no significant CAD, RA mean 25, PA 115/30 (58), LVEDP 22 mmHg, PCWP not obtained, Fick CO/CI 6.4/3.1, PAPi 3.4. PVR 5.6 WU. 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 He was diuresed with IV lasix. V/Q scan negative for chronic PE. ANA+. All other serologic test -. HRCT with no definite ILD. Started on tadalafil. Diuresed 24 lbs. Hospitalization complicated by right groin hematoma and received 1 unit PRBCs. Discharged home, weight 179 lbs.  Repeat RHC 8/23 showed elevated PCWP and severe mixed pulmonary arterial/venous hypertension, but PA pressures significantly lower than prior RHC. He was instructed to increase torsemide to 40 mg daily alternating with 20 mg every other day. Arlyce Harman and Delene Loll held with hyperkalemia.  Patient  had awake left TCAR by Dr. Carlis Abbott in 7/56, no complications.   Patient returns for followup of CHF.  Weight is up 18 lbs.  He says that his breathing is overall doing better, not short of breath walking on flat ground but he does use a walker for stability.  However, he has worsening lower extremity edema.  No chest pain.  No orthopnea/PND.    ECG (personally reviewed): Atrial fibrillation rate 49, RBBB (no change from prior).   Labs (6/23): K 3.8, creatinine 1.53 Labs (7/23): K 5.5, creatinine 1.73 Labs (8/23): K 4.6, creatinine 1.5 Labs (9/23): LDL 56, K 3.9, creatinine 1.62  PMH: 1. HTN 2. Hyperlipidemia 3. Type 2 diabetes 4. CKD stage 3 5. Atrial fibrillation: Permanent.  6. Carotid stenosis: CTA neck (6/23) with high grade stenosis of the bilateral internal carotid arteries.  - S/p left TCAR 9/23.  7. 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  - R/LHC (7/23): no significant CAD; RA mean 25, PA 115/30 (58), LVEDP 22 mmHg, PCWP not obtained, Fick CO/CI 6.4/3.1, PAPi 3.4. PVR 5.6 WU. - RHC (8/23): Mean RA 8, PA 85/40 mean 41, mean PCWP 22, CI 2.86, PVR 3.4 WU 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  See RHC and echo data above.   - V/Q scan (7/23): No evidence for chronic PE.  - High resolution CT chest (7/23): No definite ILD.  - +  ANA   Current Outpatient Medications  Medication Sig Dispense Refill   amLODipine (NORVASC) 5 MG tablet Take 1 tablet (5 mg total) by mouth daily. 30 tablet 11   atorvastatin (LIPITOR) 40 MG tablet Take 40 mg by mouth at bedtime.     cholecalciferol (VITAMIN D) 25 MCG (1000 UNIT) tablet Take 1,000 Units by mouth in the morning  and at bedtime.     clopidogrel (PLAVIX) 75 MG tablet Take 1 tablet (75 mg total) by mouth daily. 30 tablet 6   dapagliflozin propanediol (FARXIGA) 10 MG TABS tablet Take 1 tablet (10 mg total) by mouth daily. 30 tablet 5   fish oil-omega-3 fatty acids 1000 MG capsule Take 1 g by mouth every evening.     macitentan (OPSUMIT) 10 MG tablet Take 10 mg by mouth daily.     Misc Natural Products (GLUCOS-CHONDROIT-MSM COMPLEX) TABS Take 2 tablets by mouth daily.     Multiple Vitamins-Minerals (MULTIVITAMIN WITH MINERALS) tablet Take 1 tablet by mouth in the morning.  Centrum Silver     niacin 500 MG tablet Take 500 mg by mouth in the morning.     spironolactone (ALDACTONE) 25 MG tablet Take 0.5 tablets (12.5 mg total) by mouth daily. 45 tablet 3   tadalafil, PAH, (ADCIRCA) 20 MG tablet Take 2 tablets (40 mg total) by mouth daily. 60 tablet 5   warfarin (COUMADIN) 5 MG tablet Take 5 mg by mouth daily. 1 tablet on Mon, Tues, Wed, Fri, Sat  1.5 on Thursday and Sunday     oxyCODONE (OXY IR/ROXICODONE) 5 MG immediate release tablet Take 1 tablet (5 mg total) by mouth every 6 (six) hours as needed for moderate pain. (Patient not taking: Reported on 07/10/2022) 12 tablet 0   sacubitril-valsartan (ENTRESTO) 24-26 MG Take 1 tablet by mouth 2 (two) times daily. 60 tablet 11   torsemide (DEMADEX) 20 MG tablet Take 2 tablets (40 mg total) by mouth 2 (two) times daily. 180 tablet 3   No current facility-administered medications for this encounter.   Allergies  Allergen Reactions   Other Rash    Bandaids   Social History   Socioeconomic History   Marital status: Married    Spouse name: Not on file   Number of children: Not on file   Years of education: Not on file   Highest education level: Not on file  Occupational History   Occupation: Full time    Comment: Contractor  Tobacco Use   Smoking status: Never   Smokeless tobacco: Never   Tobacco comments:    Never smoke 05/18/22  Vaping Use   Vaping  Use: Never used  Substance and Sexual Activity   Alcohol use: No    Alcohol/week: 0.0 standard drinks of alcohol   Drug use: No   Sexual activity: Not on file  Other Topics Concern   Not on file  Social History Narrative   Not on file   Social Determinants of Health   Financial Resource Strain: Not on file  Food Insecurity: Not on file  Transportation Needs: Not on file  Physical Activity: Not on file  Stress: Not on file  Social Connections: Not on file  Intimate Partner Violence: Not on file   Family History  Problem Relation Age of Onset   Stroke Other    Heart disease Other    BP (!) 160/70   Pulse (!) 54   Wt 89.9 kg (198 lb 3.2 oz)   SpO2 94%   BMI  36.25 kg/m   Wt Readings from Last 3 Encounters:  07/23/22 89.9 kg (198 lb 3.2 oz)  07/10/22 86.2 kg (190 lb)  07/02/22 84.8 kg (187 lb)   PHYSICAL EXAM: General: NAD Neck: JVP 12 cm, no thyromegaly or thyroid nodule.  Lungs: Clear to auscultation bilaterally with normal respiratory effort. CV: Nondisplaced PMI.  Heart irregular S1/S2, no S3/S4, 2/6 SEM RUSB.  2+ edema to knees.  No carotid bruit.  Difficult to palpate pedal pulses.  Abdomen: Soft, nontender, no hepatosplenomegaly, no distention.  Skin: Intact without lesions or rashes.  Neurologic: Alert and oriented x 3.  Psych: Normal affect. Extremities: No clubbing or cyanosis.  HEENT: Normal.   ASSESSMENT & PLAN: 1. 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 RHC/LHC 7/23 showed preserved cardiac output with severe pulmonary hypertension. 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  PA  pressure was still high, but is significantly lower than prior RHC. Today, NYHA class II but volume overloaded on exam and weight up 18 lbs.   - Increase torsemide to 40 mg bid, BMET/BNP today and BMET in 10 days.  - Add back spironolactone 12.5 mg daily.  - Continue Entresto 24/26 mg bid. - Continue Farxiga 10 mg daily. 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  Some improvement on 8/23 RHC still with severe mixed PAH/PVH with PVR down to 3.4 WU.  He has never smoked and has no known lung disease.  He has been on warfarin for atrial fibrillation long-term. No known rheumatologic illness.  Not a drinker though he has some signs of cirrhosis by prior liver imaging.  V/Q scan in 7/23 negative for chronic PEs and HRCT chest without definite ILD. Serologic workup showed anti-SCL 70 neg, anti-centromere Ab neg, RF neg, ANA with reflex positive. 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 He has been referred to rheumatology for further w/u and has an appt scheduled in 12/23.  - Push diuresis as above.  - Continue Opsumit 10 mg daily. - Continue tadalafil 40 mg daily. - Arrange in-lab sleep study => will order at [facility]. - Would not add Uptravi at this time, need better control of volume.  3. Atrial fibrillation: Permanent.   - Continue warfarin.   - Bradycardic, does not need nodal blocker.  4. DM2: Continue SGLT2i 5. Carotid stenosis: CTA neck in 6/23 showed high grade bilateral ICA stenoses. He has had awake L TCAR.  - He has been started on ASA and Plavix => suspect it should be ok to stop ASA at 1 month (08/01/22) but continue Plavix (will review this with Dr. Carlis Abbott). - He is on warfarin long-term for AF.  CBC today.  - Continue statin, goal LDL < 70.   Followup 3 wks   Loralie Champagne 07/23/2022

## 2022-07-23 NOTE — Addendum Note (Signed)
Encounter addended by: Larey Dresser, MD on: 07/23/2022 3:54 PM  Actions taken: Level of Service modified

## 2022-07-23 NOTE — Patient Instructions (Signed)
STOP  Asprin  CHANGE Torsemide to 40 mg Twice daily  START Spironolactone 12.5 mg daily   Labs done today, your results will be available in MyChart, we will contact you for abnormal readings.  Repeat blood work in 10 days at [facility].  Your physician has recommended that you have a sleep study. This test records several body functions during sleep, including: brain activity, eye movement, oxygen and carbon dioxide blood levels, heart rate and rhythm, breathing rate and rhythm, the flow of air through your mouth and nose, snoring, body muscle movements, and chest and belly movement. ONCE APPROVED BY YOUR INSURANCE YOU WILL BE CALLED TO ARRANGE THE TEST.  Your physician recommends that you schedule a follow-up appointment in: 3 weeks  If you have any questions or concerns before your next appointment please send [place] a message through [place] or call our office at [phone].    TO LEAVE A MESSAGE FOR THE NURSE SELECT OPTION 2, PLEASE LEAVE A MESSAGE INCLUDING: YOUR NAME DATE OF BIRTH CALL BACK NUMBER REASON FOR CALL**this is important as we prioritize the call backs  YOU WILL RECEIVE A CALL BACK THE SAME DAY AS LONG AS YOU CALL BEFORE 4:00 PM  At the [facility], you and your health needs are our priority. As part of our continuing mission to provide you with exceptional heart care, we have created designated Provider Care Teams. These Care Teams include your primary Cardiologist (physician) and Advanced Practice Providers (APPs- Physician Assistants and Nurse Practitioners) who all work together to provide you with the care you need, when you need it.   You may see any of the following providers on your designated Care Team at your next follow up: Dr Glori Bickers Dr Loralie Champagne Dr. Roxana Hires, NP Lyda Jester, [facility] [place], [place] Forestine Na, NP Audry Riles, PharmD   Please be sure to bring in all your  medications bottles to every appointment.

## 2022-07-27 ENCOUNTER — Other Ambulatory Visit: Payer: Self-pay | Admitting: *Deleted

## 2022-07-27 DIAGNOSIS — I6523 Occlusion and stenosis of bilateral carotid arteries: Secondary | ICD-10-CM

## 2022-07-31 ENCOUNTER — Ambulatory Visit (HOSPITAL_COMMUNITY)
Admission: RE | Admit: 2022-07-31 | Discharge: 2022-07-31 | Disposition: A | Discharge status: Home / Self Care | Payer: Medicare PPO | Source: Ambulatory Visit | Attending: Vascular Surgery | Admitting: Vascular Surgery

## 2022-07-31 ENCOUNTER — Encounter: Payer: Self-pay | Admitting: Vascular Surgery

## 2022-07-31 ENCOUNTER — Ambulatory Visit (INDEPENDENT_AMBULATORY_CARE_PROVIDER_SITE_OTHER): Payer: Medicare PPO | Admitting: Vascular Surgery

## 2022-07-31 VITALS — BP 173/65 | HR 66 | Temp 98.0°F | Resp 18 | Ht 67.0 in | Wt 191.0 lb

## 2022-07-31 DIAGNOSIS — Z299 Encounter for prophylactic measures, unspecified: Secondary | ICD-10-CM | POA: Diagnosis not present

## 2022-07-31 DIAGNOSIS — I4891 Unspecified atrial fibrillation: Secondary | ICD-10-CM | POA: Diagnosis not present

## 2022-07-31 DIAGNOSIS — I6523 Occlusion and stenosis of bilateral carotid arteries: Secondary | ICD-10-CM

## 2022-07-31 DIAGNOSIS — I10 Essential (primary) hypertension: Secondary | ICD-10-CM | POA: Diagnosis not present

## 2022-07-31 DIAGNOSIS — Z6833 Body mass index (BMI) 33.0-33.9, adult: Secondary | ICD-10-CM | POA: Diagnosis not present

## 2022-07-31 DIAGNOSIS — Z23 Encounter for immunization: Secondary | ICD-10-CM | POA: Diagnosis not present

## 2022-07-31 DIAGNOSIS — I5032 Chronic diastolic (congestive) heart failure: Secondary | ICD-10-CM | POA: Diagnosis not present

## 2022-07-31 NOTE — Progress Notes (Signed)
Patient name: Robert Baldwin MRN: [id number] DOB: [birth year] Sex: male  REASON FOR VISIT: Post-op after TCAR  HPI: Robert Baldwin is a 81 y.o. male with history of atrial fibrillation on Coumadin as well as severe pulmonary hypertension that presents for postop check after left TCAR on 07/02/2022 for an asymptomatic high grade stenosis.  This was an awake TCAR given he could not get general anesthesia due to his severe pulmonary hypertension.  Postop course was complicated by hematoma of the left neck incision that was managed nonoperatively.  We held his Coumadin at discharge but instructed him to stay on aspirin Plavix for his stent.   Doing well today.  No neurologic events since his last visit.  Left neck hematoma has resolved.  His cardiologist Dr. Aundra Dubin has put him on Coumadin and Plavix and stopped his aspirin for now.  He wants to proceed with having his right side done given he has a contralateral high-grade carotid stenosis as well.    Past Medical History:  Diagnosis Date   (HFpEF) heart failure with preserved ejection fraction (HCC)    Arthritis    Atrial fibrillation (HCC)    Carotid artery disease ([place])    Dysrhythmia    Hyperlipidemia    Hypertension    Pulmonary hypertension ([place])    Type 2 diabetes mellitus ([place])     Past Surgical History:  Procedure Laterality Date   Cataract surgery     COLONOSCOPY     HERNIA REPAIR     RIGHT HEART CATH N/A 05/28/2022   Procedure: RIGHT HEART CATH;  Surgeon: Larey Dresser, MD;  Location: [place] [facility];  Service: Cardiovascular;  Laterality: N/A;   RIGHT/LEFT HEART CATH AND CORONARY ANGIOGRAPHY N/A 04/30/2022   Procedure: RIGHT/LEFT HEART CATH AND CORONARY ANGIOGRAPHY;  Surgeon: Nelva Bush, MD;  Location: [place] [facility];  Service: Cardiovascular;  Laterality: N/A;   TRANSCAROTID ARTERY REVASCULARIZATION  Left 07/02/2022   Procedure: Transcarotid Artery Revascularization;  Surgeon: Marty Heck, MD;  Location:  [place];  Service: Vascular;  Laterality: Left;   ULTRASOUND GUIDANCE FOR VASCULAR ACCESS Right 07/02/2022   Procedure: ULTRASOUND GUIDANCE FOR VASCULAR ACCESS;  Surgeon: Marty Heck, MD;  Location: [place];  Service: Vascular;  Laterality: Right;   YAG LASER APPLICATION Right 82/42/3536   Procedure: YAG LASER APPLICATION;  Surgeon: Williams Che, MD;  Location: [facility];  Service: Ophthalmology;  Laterality: Right;    Family History  Problem Relation Age of Onset   Stroke Other    Heart disease Other     SOCIAL HISTORY: Social History   Tobacco Use   Smoking status: Never   Smokeless tobacco: Never   Tobacco comments:    Never smoke 05/18/22  Substance Use Topics   Alcohol use: No    Alcohol/week: 0.0 standard drinks of alcohol    Allergies  Allergen Reactions   Other Rash    Bandaids    Current Outpatient Medications  Medication Sig Dispense Refill   amLODipine (NORVASC) 5 MG tablet Take 1 tablet (5 mg total) by mouth daily. 30 tablet 11   atorvastatin (LIPITOR) 40 MG tablet Take 40 mg by mouth at bedtime.     cholecalciferol (VITAMIN D) 25 MCG (1000 UNIT) tablet Take 1,000 Units by mouth in the morning and at bedtime.     clopidogrel (PLAVIX) 75 MG tablet Take 1 tablet (75 mg total) by mouth daily. 30 tablet 6   dapagliflozin propanediol (FARXIGA) 10 MG  TABS tablet Take 1 tablet (10 mg total) by mouth daily. 30 tablet 5   fish oil-omega-3 fatty acids 1000 MG capsule Take 1 g by mouth every evening.     macitentan (OPSUMIT) 10 MG tablet Take 10 mg by mouth daily.     Misc Natural Products (GLUCOS-CHONDROIT-MSM COMPLEX) TABS Take 2 tablets by mouth daily.     Multiple Vitamins-Minerals (MULTIVITAMIN WITH MINERALS) tablet Take 1 tablet by mouth in the morning.  Centrum Silver     niacin 500 MG tablet Take 500 mg by mouth in the morning.     sacubitril-valsartan (ENTRESTO) 24-26 MG Take 1 tablet by mouth 2 (two) times daily. 60 tablet 11   spironolactone (ALDACTONE)  25 MG tablet Take 0.5 tablets (12.5 mg total) by mouth daily. 45 tablet 3   tadalafil, PAH, (ADCIRCA) 20 MG tablet Take 2 tablets (40 mg total) by mouth daily. 60 tablet 5   torsemide (DEMADEX) 20 MG tablet Take 2 tablets (40 mg total) by mouth 2 (two) times daily. 180 tablet 3   warfarin (COUMADIN) 5 MG tablet Take 5 mg by mouth daily. 1 tablet on Mon, Tues, Wed, Fri, Sat  1.5 on Thursday and Sunday     oxyCODONE (OXY IR/ROXICODONE) 5 MG immediate release tablet Take 1 tablet (5 mg total) by mouth every 6 (six) hours as needed for moderate pain. (Patient not taking: Reported on 07/10/2022) 12 tablet 0   No current facility-administered medications for this visit.    REVIEW OF SYSTEMS:  [X]  denotes positive finding, [ ]  denotes negative finding Cardiac  Comments:  Chest pain or chest pressure:    Shortness of breath upon exertion:    Short of breath when lying flat:    Irregular heart rhythm:        Vascular    Pain in calf, thigh, or hip brought on by ambulation:    Pain in feet at night that wakes you up from your sleep:     Blood clot in your veins:    Leg swelling:         Pulmonary    Oxygen at home:    Productive cough:     Wheezing:         Neurologic    Sudden weakness in arms or legs:     Sudden numbness in arms or legs:     Sudden onset of difficulty speaking or slurred speech:    Temporary loss of vision in one eye:     Problems with dizziness:         Gastrointestinal    Blood in stool:     Vomited blood:         Genitourinary    Burning when urinating:     Blood in urine:        Psychiatric    Major depression:         Hematologic    Bleeding problems:    Problems with blood clotting too easily:        Skin    Rashes or ulcers:        Constitutional    Fever or chills:      PHYSICAL EXAM: Vitals:   07/31/22 1045 07/31/22 1048  BP: (!) 166/67 (!) 173/65  Pulse: 66 66  Resp: 18   Temp: 98 F (36.7 C)   TempSrc: Temporal   SpO2: 96%    Weight: 191 lb (86.6 kg)   Height: 5\' 7"  (1.702 m)  GENERAL: The patient is a well-nourished male, in no acute distress. The vital signs are documented above. Left neck hematoma resolved PULMONARY: No respiratory distress. ABDOMEN: Soft and non-tender. MUSCULOSKELETAL: There are no major deformities or cyanosis. NEUROLOGIC: No focal weakness or paresthesias are detected.  Cranial nerves II through XII grossly intact PSYCHIATRIC: The patient has a normal affect.  DATA:   CLINICAL DATA:  Shortness of breath, lower extremity edema bilateral carotid stenosis   EXAM: CT ANGIOGRAPHY NECK   TECHNIQUE: Multidetector CT imaging of the neck was performed using the standard protocol during bolus administration of intravenous contrast. Multiplanar CT image reconstructions and MIPs were obtained to evaluate the vascular anatomy. Carotid stenosis measurements (when applicable) are obtained utilizing NASCET criteria, using the distal internal carotid diameter as the denominator.   RADIATION DOSE REDUCTION: This exam was performed according to the departmental dose-optimization program which includes automated exposure control, adjustment of the mA and/or kV according to patient size and/or use of iterative reconstruction technique.   CONTRAST:  94mL OMNIPAQUE IOHEXOL 350 MG/ML SOLN   COMPARISON:  None Available.   FINDINGS: Aortic arch: There is mild calcified atherosclerotic plaque in the imaged aortic arch. The origins of the major branch vessels are patent. The subclavian arteries are patent to the level imaged.   Right carotid system: The right common carotid artery is widely patent. There is bulky mixed plaque in the proximal right internal carotid artery resulting in critical stenosis. The percentage of stenosis is difficult to calculated but is greater than 80%. The distal right internal carotid artery is widely patent. The right external carotid artery is patent with  moderate stenosis at the origin.   There is no evidence of dissection or aneurysm.   Left carotid system: The left common carotid artery is patent with calcified plaque distally but no hemodynamically significant stenosis. There is bulky mixed plaque at the bifurcation resulting in high-grade stenosis. The percentage of stenosis is difficult to calculate common but is estimated at greater than 80%. The distal left internal carotid artery is patent. There is no evidence of dissection or aneurysm.   Vertebral arteries: The vertebral arteries are patent, without hemodynamically significant stenosis or occlusion. There is no dissection or aneurysm. There is mild calcified plaque in the right V4 segment without high-grade stenosis.   There is calcified plaque in the intracranial ICAs without high-grade stenosis.   Skeleton: There is multilevel degenerative change of the cervical spine. There is no acute osseous abnormality or suspicious osseous lesion.   Other neck: The soft tissues of the neck are unremarkable. There is mucosal thickening in the maxillary sinuses with evidence of chronic sinusitis.   Upper chest: There are bilateral pleural effusions, right larger than left, incompletely imaged. Mosaic attenuation in the lung apices suggests air trapping/small airway disease. The main pulmonary artery appears enlarged suggesting pulmonary hypertension. There are prominent upper mediastinal lymph nodes without evidence of pathologic enlargement, nonspecific.   IMPRESSION: 1. High-grade/Critical stenosis of the bilateral proximal internal carotid arteries, right worse than left. 2. Patent vertebral arteries. 3. Right larger than left pleural effusions, incompletely imaged. Enlargement of the main pulmonary artery suggests pulmonary hypertension. 4. Mosaic attenuation in the lung apices suggests air trapping/small airway disease.  Assessment/Plan:   81 y.o. male with history  of atrial fibrillation on Coumadin as well as severe pulmonary hypertension that presents for postop check after left TCAR on 07/02/2022 for an asymptomatic high grade stenosis.  This was an awake TCAR given he could  not get general anesthesia due to his severe pulmonary hypertension.  Postop course was complicated by hematoma at the neck incision that was managed nonoperatively.  We did hold his Coumadin at discharge but instructed to stay on aspirin Plavix for his stent.  His left neck hematoma is resolved.  His carotid duplex today looks good and he does not have any evidence of a residual critical stenosis on the left after recent intervention.  He does have a contralateral high-grade stenosis as previously documented by Dr. Donnetta Hutching and further CTA neck work-up.  I discussed the option of medical management versus surgical intervention on the right ICA.  He wants to proceed with right TCAR and is very fearful of his stroke risk.  We will schedule for 08/20/2022 with me again in the OR with no general anesthesia given his comorbidities.  He will see Dr. Aundra Dubin the week prior.  Discussed when he stops his Coumadin for right TCAR he can start an aspirin again with his Plavix.  Discussed risk and benefits again including 1% perioperative stroke risk.  Discussed this is for long-term stroke risk reduction.   Marty Heck, MD [facility] Office: [phone]

## 2022-08-01 ENCOUNTER — Other Ambulatory Visit: Payer: Self-pay | Admitting: *Deleted

## 2022-08-01 DIAGNOSIS — I6523 Occlusion and stenosis of bilateral carotid arteries: Secondary | ICD-10-CM

## 2022-08-03 ENCOUNTER — Telehealth (HOSPITAL_COMMUNITY): Payer: Self-pay | Admitting: Cardiology

## 2022-08-03 ENCOUNTER — Other Ambulatory Visit (HOSPITAL_COMMUNITY): Payer: Self-pay | Admitting: Cardiology

## 2022-08-03 DIAGNOSIS — I50812 Chronic right heart failure: Secondary | ICD-10-CM

## 2022-08-03 NOTE — Telephone Encounter (Signed)
Abnormal labs received labs drawn 07/31/22 BUN 52 Cr 2.02 K 4.4   Per [facility] Repeat labs x 1 week Keep follow up as scheduled with Dr Aundra Dubin 10/23   Pt aware via wife Orders placed for [facility]

## 2022-08-07 ENCOUNTER — Other Ambulatory Visit (HOSPITAL_COMMUNITY): Payer: Self-pay

## 2022-08-08 ENCOUNTER — Other Ambulatory Visit (HOSPITAL_COMMUNITY): Payer: Self-pay | Admitting: Cardiology

## 2022-08-08 DIAGNOSIS — I4891 Unspecified atrial fibrillation: Secondary | ICD-10-CM | POA: Diagnosis not present

## 2022-08-10 NOTE — Progress Notes (Signed)
Advanced Heart Failure Clinic Note   PCP: Monico Blitz, MD Primary Cardiologist: Dr. Domenic Polite HF Cardiologist: Dr. Aundra Dubin  HPI: Robert Baldwin is a 81 y.o. male with history of b/l carotid artery stenosis, DM II, HTN, HLD, permanent atrial fibrillation. He follows with Dr. Domenic Polite in the cardiology clinic.    Saw [facility] for carotid artery stenosis in 6/23. Was noting more dyspnea with exertion and lower extremity edema for about a month. He was subsequently started on po lasix 20 mg daily.   He was seen by Dr. Domenic Polite in the clinic 04/26/22 for evaluation of recent symptoms and pre-operative evaluation in preparation for left CEA followed by staged right CEA. Echo day of the visit showed EF 70-75%, hyperdynamic LV function, mild LVH, interventricular septum flattened in systole and diastole consistent with RV pressure and volume overload, RV moderately enlarged and moderately reduced, RVSP 120 mmHg, moderate LAE, severe RAE, moderate pericardial effusion, severe TR, dilated IVC. Patient was scheduled for outpatient R/LHC to better assess hemodynamics and further characterize pulmonary hypertension.    [facility] 7/23 showed no significant CAD, RA mean 25, PA 115/30 (58), LVEDP 22 mmHg, PCWP not obtained, Fick CO/CI 6.4/3.1, PAPi 3.4. PVR 5.6 WU. 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 He was diuresed with IV lasix. V/Q scan negative for chronic PE. ANA+. All other serologic test -. HRCT with no definite ILD. Started on tadalafil. Diuresed 24 lbs. Hospitalization complicated by right groin hematoma and received 1 unit PRBCs. Discharged home, weight 179 lbs.  Repeat RHC 8/23 showed elevated PCWP and severe mixed pulmonary arterial/venous hypertension, but PA pressures significantly lower than prior RHC. He was instructed to increase torsemide to 40 mg daily alternating with 20 mg every other day. Arlyce Harman and Delene Loll held with hyperkalemia.  Patient  had awake left TCAR by Dr. Carlis Abbott in 1/24, no complications.   Patient returns for followup of CHF.  Weight is up 18 lbs.  He says that his breathing is overall doing better, not short of breath walking on flat ground but he does use a walker for stability.  However, he has worsening lower extremity edema.  No chest pain.  No orthopnea/PND.    ECG (personally reviewed): Atrial fibrillation rate 49, RBBB (no change from prior).   Labs (6/23): K 3.8, creatinine 1.53 Labs (7/23): K 5.5, creatinine 1.73 Labs (8/23): K 4.6, creatinine 1.5 Labs (9/23): LDL 56, K 3.9, creatinine 1.62  PMH: 1. HTN 2. Hyperlipidemia 3. Type 2 diabetes 4. CKD stage 3 5. Atrial fibrillation: Permanent.  6. Carotid stenosis: CTA neck (6/23) with high grade stenosis of the bilateral internal carotid arteries.  - S/p left TCAR 9/23.  7. 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  - R/LHC (7/23): no significant CAD; RA mean 25, PA 115/30 (58), LVEDP 22 mmHg, PCWP not obtained, Fick CO/CI 6.4/3.1, PAPi 3.4. PVR 5.6 WU. - RHC (8/23): Mean RA 8, PA 85/40 mean 41, mean PCWP 22, CI 2.86, PVR 3.4 WU 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  See RHC and echo data above.   - V/Q scan (7/23): No evidence for chronic PE.  - High resolution CT chest (7/23): No definite ILD.  - +  ANA   Current Outpatient Medications  Medication Sig Dispense Refill   amLODipine (NORVASC) 5 MG tablet Take 1 tablet (5 mg total) by mouth daily. 30 tablet 11   atorvastatin (LIPITOR) 40 MG tablet Take 40 mg by mouth at bedtime.     cholecalciferol (VITAMIN D) 25 MCG (1000 UNIT) tablet Take 1,000 Units by mouth in the morning  and at bedtime.     clopidogrel (PLAVIX) 75 MG tablet Take 1 tablet (75 mg total) by mouth daily. (Patient taking differently: Take 75 mg by mouth at bedtime.) 30 tablet 6   dapagliflozin propanediol (FARXIGA) 10 MG TABS tablet Take 1 tablet (10 mg total) by mouth daily. 30 tablet 5   fish oil-omega-3 fatty acids 1000 MG capsule Take 1 g by mouth every evening.     macitentan (OPSUMIT) 10 MG tablet Take 10 mg by mouth daily.     Misc Natural Products (GLUCOS-CHONDROIT-MSM COMPLEX) TABS Take 2 tablets by mouth daily.     Multiple Vitamins-Minerals (MULTIVITAMIN WITH MINERALS) tablet Take 1 tablet by mouth in the morning.  Centrum Silver     niacin 500 MG tablet Take 500 mg by mouth in the morning.     oxyCODONE (OXY IR/ROXICODONE) 5 MG immediate release tablet Take 1 tablet (5 mg total) by mouth every 6 (six) hours as needed for moderate pain. (Patient not taking: Reported on 07/10/2022) 12 tablet 0   sacubitril-valsartan (ENTRESTO) 24-26 MG Take 1 tablet by mouth 2 (two) times daily. 60 tablet 11   spironolactone (ALDACTONE) 25 MG tablet Take 0.5 tablets (12.5 mg total) by mouth daily. 45 tablet 3   tadalafil, PAH, (ADCIRCA) 20 MG tablet Take 2 tablets (40 mg total) by mouth daily. 60 tablet 5   torsemide (DEMADEX) 20 MG tablet Take 2 tablets (40 mg total) by mouth 2 (two) times daily. (Patient taking differently: Take 40 mg by mouth daily.) 180 tablet 3   warfarin (COUMADIN) 5 MG tablet Take 5-7.5 mg by mouth See admin instructions. Take 5 mg on Mon, Tues, Wed, Fri, and Sat Take 7.5 mg on Thurs and Sun     No current facility-administered medications for this visit.   Allergies  Allergen Reactions   Other Rash    Bandaids   Social History   Socioeconomic History   Marital status: Married    Spouse name: Not on file   Number of children: Not on file   Years of education: Not on file   Highest education level: Not on file  Occupational History   Occupation: Full time    Comment:  Contractor  Tobacco Use   Smoking status: Never   Smokeless tobacco: Never   Tobacco comments:    Never smoke 05/18/22  Vaping Use   Vaping Use: Never used  Substance and Sexual Activity   Alcohol use: No    Alcohol/week: 0.0 standard drinks of alcohol   Drug use: No   Sexual activity: Not on file  Other Topics Concern   Not on file  Social History Narrative   Not on file   Social Determinants of Health   Financial Resource Strain: Not on file  Food Insecurity: Not on file  Transportation Needs: Not on file  Physical Activity: Not on file  Stress: Not on file  Social Connections: Not on file  Intimate Partner Violence: Not on file   Family History  Problem Relation Age of Onset   Stroke Other    Heart disease Other  There were no vitals taken for this visit.  Wt Readings from Last 3 Encounters:  07/31/22 86.6 kg (191 lb)  07/23/22 89.9 kg (198 lb 3.2 oz)  07/10/22 86.2 kg (190 lb)   PHYSICAL EXAM: General: NAD Neck: JVP 12 cm, no thyromegaly or thyroid nodule.  Lungs: Clear to auscultation bilaterally with normal respiratory effort. CV: Nondisplaced PMI.  Heart irregular S1/S2, no S3/S4, 2/6 SEM RUSB.  2+ edema to knees.  No carotid bruit.  Difficult to palpate pedal pulses.  Abdomen: Soft, nontender, no hepatosplenomegaly, no distention.  Skin: Intact without lesions or rashes.  Neurologic: Alert and oriented x 3.  Psych: Normal affect. Extremities: No clubbing or cyanosis.  HEENT: Normal.   ASSESSMENT & PLAN: 1. 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 RHC/LHC 7/23 showed preserved cardiac output with severe pulmonary hypertension. Repeat  RHC 8/23 showed elevated PCWP and severe mixed pulmonary arterial/pulmonary venous hypertension.  PA pressure was still high, but is significantly lower than prior RHC. Today, NYHA class II but volume overloaded on exam and weight up 18 lbs.   - Increase torsemide to 40 mg bid, BMET/BNP today and BMET in 10 days.  - Add back spironolactone 12.5 mg daily.  - Continue Entresto 24/26 mg bid. - Continue Farxiga 10 mg daily. 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  Some improvement on 8/23 RHC still with severe mixed PAH/PVH with PVR down to 3.4 WU.  He has never smoked and has no known lung disease.  He has been on warfarin for atrial fibrillation long-term. No known rheumatologic illness.  Not a drinker though he has some signs of cirrhosis by prior liver imaging.  V/Q scan in 7/23 negative for chronic PEs and HRCT chest without definite ILD. Serologic workup showed anti-SCL 70 neg, anti-centromere Ab neg, RF neg, ANA with reflex positive. 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 He has been referred to rheumatology for further w/u and has an appt scheduled in 12/23.  - Push diuresis as above.  - Continue Opsumit 10 mg daily. - Continue tadalafil 40 mg daily. - Arrange in-lab sleep study => will order at [facility]. - Would not add Uptravi at this time, need better control of volume.  3. Atrial fibrillation: Permanent.   - Continue warfarin.   - Bradycardic, does not need nodal blocker.  4. DM2: Continue SGLT2i 5. Carotid stenosis: CTA neck in 6/23 showed high grade bilateral ICA stenoses. He has had awake L TCAR.  - He has been started on ASA and Plavix => suspect it should be ok to stop ASA at 1 month (08/01/22) but continue Plavix (will review this with Dr. Carlis Abbott). - He is on warfarin long-term for AF.  CBC today.  - Continue statin, goal LDL < 70.   Followup 3 wks   Lamy 08/10/2022

## 2022-08-10 NOTE — Progress Notes (Signed)
Surgical Instructions    Your procedure is scheduled on Monday, 08/20/22.  Report to [facility] Main Entrance "A" at 7:30 A.M., then check in with the Admitting office.  Call this number if you have problems the morning of surgery:  [phone]   If you have any questions prior to your surgery date call [phone]: Open Monday-Friday 8am-4pm If you experience any cold or flu symptoms such as cough, fever, chills, shortness of breath, etc. between now and your scheduled surgery, please notify us at the above number     Remember:  Do not eat or drink after midnight the night before your surgery     Take these medicines the morning of surgery with A SIP OF WATER:  amLODipine (NORVASC) macitentan (OPSUMIT) tadalafil, PAH, (ADCIRCA)  As of today, STOP taking any (unless otherwise instructed by your surgeon) Aleve, Naproxen, Ibuprofen, Motrin, Advil, Goody's, BC's, all herbal medications, fish oil, and all vitamins.  Stop taking warfarin (COUMADIN) 3 days prior to your surgery. Your last dose will be 08/16/22.  Continue taking clopidogrel (PLAVIX) and aspirin through the day of surgery.  WHAT DO I DO ABOUT MY DIABETES MEDICATION?   Do not take oral diabetes medicines (pills) the morning of surgery.  Do not take dapagliflozin propanediol (FARXIGA) 3 days prior to surgery. Your last dose will be 08/16/22.  The day of surgery, do not take other diabetes injectables, including Byetta (exenatide), Bydureon (exenatide ER), Victoza (liraglutide), or Trulicity (dulaglutide).  If your CBG is greater than 220 mg/dL, you may take  of your sliding scale (correction) dose of insulin.   HOW TO MANAGE YOUR DIABETES BEFORE AND AFTER SURGERY  Why is it important to control my blood sugar before and after surgery? Improving blood sugar levels before and after surgery helps healing and can limit problems. A way of improving blood sugar control is eating a healthy diet by:  Eating less sugar  and carbohydrates  Increasing activity/exercise  Talking with your doctor about reaching your blood sugar goals High blood sugars (greater than 180 mg/dL) can raise your risk of infections and slow your recovery, so you will need to focus on controlling your diabetes during the weeks before surgery. Make sure that the doctor who takes care of your diabetes knows about your planned surgery including the date and location.  How do I manage my blood sugar before surgery? Check your blood sugar at least 4 times a day, starting 2 days before surgery, to make sure that the level is not too high or low.  Check your blood sugar the morning of your surgery when you wake up and every 2 hours until you get to the Short Stay unit.  If your blood sugar is less than 70 mg/dL, you will need to treat for low blood sugar: Do not take insulin. Treat a low blood sugar (less than 70 mg/dL) with  cup of clear juice (cranberry or apple), 4 glucose tablets, OR glucose gel. Recheck blood sugar in 15 minutes after treatment (to make sure it is greater than 70 mg/dL). If your blood sugar is not greater than 70 mg/dL on recheck, call [phone] for further instructions. Report your blood sugar to the short stay nurse when you get to Short Stay.  If you are admitted to the hospital after surgery: Your blood sugar will be checked by the staff and you will probably be given insulin after surgery (instead of oral diabetes medicines) to make sure you have good blood sugar levels.  The goal for blood sugar control after surgery is 80-180 mg/dL.           Do not wear jewelry or makeup. Do not wear lotions, powders, cologne or deodorant. Men may shave face and neck. Do not bring valuables to the hospital. Do not wear nail polish, gel polish, artificial nails, or any other type of covering on natural nails (fingers and toes) If you have artificial nails or gel coating that need to be removed by a nail salon, please have  this removed prior to surgery. Artificial nails or gel coating may interfere with anesthesia's ability to adequately monitor your vital signs.  [facility] is not responsible for any belongings or valuables.    Do NOT Smoke (Tobacco/Vaping)  24 hours prior to your procedure  If you use a CPAP at night, you may bring your mask for your overnight stay.   Contacts, glasses, hearing aids, dentures or partials may not be worn into surgery, please bring cases for these belongings   For patients admitted to the hospital, discharge time will be determined by your treatment team.   Patients discharged the day of surgery will not be allowed to drive home, and someone needs to stay with them for 24 hours.   SURGICAL WAITING ROOM VISITATION Patients having surgery or a procedure may have no more than 2 support people in the waiting area - these visitors may rotate.   Children under the age of 66 must have an adult with them who is not the patient. If the patient needs to stay at the hospital during part of their recovery, the visitor guidelines for inpatient rooms apply. Pre-op nurse will coordinate an appropriate time for 1 support person to accompany patient in pre-op.  This support person may not rotate.   Please refer to [email or web address].hu for the visitor guidelines for Inpatients (after your surgery is over and you are in a regular room).    Special instructions:    Oral Hygiene is also important to reduce your risk of infection.  Remember - BRUSH YOUR TEETH THE MORNING OF SURGERY WITH YOUR REGULAR TOOTHPASTE   [place]- Preparing For Surgery  Before surgery, you can play an important role. Because skin is not sterile, your skin needs to be as free of germs as possible. You can reduce the number of germs on your skin by washing with CHG (chlorahexidine gluconate) Soap before surgery.  CHG is an antiseptic cleaner which kills germs and  bonds with the skin to continue killing germs even after washing.     Please do not use if you have an allergy to CHG or antibacterial soaps. If your skin becomes reddened/irritated stop using the CHG.  Do not shave (including legs and underarms) for at least 48 hours prior to first CHG shower. It is OK to shave your face.  Please follow these instructions carefully.     Shower the NIGHT BEFORE SURGERY and the MORNING OF SURGERY with CHG Soap.   If you chose to wash your hair, wash your hair first as usual with your normal shampoo. After you shampoo, rinse your hair and body thoroughly to remove the shampoo.  Then [employer] and genitals (private parts) with your normal soap and rinse thoroughly to remove soap.  After that Use CHG Soap as you would any other liquid soap. You can apply CHG directly to the skin and wash gently with a scrungie or a clean washcloth.   Apply the CHG Soap to  your body ONLY FROM THE NECK DOWN.  Do not use on open wounds or open sores. Avoid contact with your eyes, ears, mouth and genitals (private parts). Wash Face and genitals (private parts)  with your normal soap.   Wash thoroughly, paying special attention to the area where your surgery will be performed.  Thoroughly rinse your body with warm water from the neck down.  DO NOT shower/wash with your normal soap after using and rinsing off the CHG Soap.  Pat yourself dry with a CLEAN TOWEL.  Wear CLEAN PAJAMAS to bed the night before surgery  Place CLEAN SHEETS on your bed the night before your surgery  DO NOT SLEEP WITH PETS.   Day of Surgery: Take a shower with CHG soap. Wear Clean/Comfortable clothing the morning of surgery Do not apply any deodorants/lotions.   Remember to brush your teeth WITH YOUR REGULAR TOOTHPASTE.    If you received a COVID test during your pre-op visit, it is requested that you wear a mask when out in public, stay away from anyone that may not be feeling well, and notify  your surgeon if you develop symptoms. If you have been in contact with anyone that has tested positive in the last 10 days, please notify your surgeon.    Please read over the following fact sheets that you were given.

## 2022-08-13 ENCOUNTER — Encounter (HOSPITAL_COMMUNITY): Payer: Self-pay

## 2022-08-13 ENCOUNTER — Encounter (HOSPITAL_COMMUNITY)
Admission: RE | Admit: 2022-08-13 | Discharge: 2022-08-13 | Disposition: A | Discharge status: Home / Self Care | Payer: Medicare PPO | Source: Ambulatory Visit | Attending: Vascular Surgery | Admitting: Vascular Surgery

## 2022-08-13 ENCOUNTER — Other Ambulatory Visit: Payer: Self-pay

## 2022-08-13 ENCOUNTER — Ambulatory Visit (HOSPITAL_COMMUNITY)
Admission: RE | Admit: 2022-08-13 | Discharge: 2022-08-13 | Disposition: A | Discharge status: Home / Self Care | Payer: Medicare PPO | Source: Ambulatory Visit | Attending: Family Medicine | Admitting: Family Medicine

## 2022-08-13 ENCOUNTER — Telehealth: Payer: Self-pay

## 2022-08-13 VITALS — BP 138/54 | HR 57 | Wt 189.8 lb

## 2022-08-13 DIAGNOSIS — E1122 Type 2 diabetes mellitus with diabetic chronic kidney disease: Secondary | ICD-10-CM | POA: Insufficient documentation

## 2022-08-13 DIAGNOSIS — I272 Pulmonary hypertension, unspecified: Secondary | ICD-10-CM

## 2022-08-13 DIAGNOSIS — E1159 Type 2 diabetes mellitus with other circulatory complications: Secondary | ICD-10-CM

## 2022-08-13 DIAGNOSIS — N183 Chronic kidney disease, stage 3 unspecified: Secondary | ICD-10-CM | POA: Insufficient documentation

## 2022-08-13 DIAGNOSIS — Z1152 Encounter for screening for COVID-19: Secondary | ICD-10-CM | POA: Insufficient documentation

## 2022-08-13 DIAGNOSIS — Z7902 Long term (current) use of antithrombotics/antiplatelets: Secondary | ICD-10-CM | POA: Insufficient documentation

## 2022-08-13 DIAGNOSIS — I50812 Chronic right heart failure: Secondary | ICD-10-CM | POA: Diagnosis not present

## 2022-08-13 DIAGNOSIS — I5032 Chronic diastolic (congestive) heart failure: Secondary | ICD-10-CM | POA: Diagnosis not present

## 2022-08-13 DIAGNOSIS — I130 Hypertensive heart and chronic kidney disease with heart failure and stage 1 through stage 4 chronic kidney disease, or unspecified chronic kidney disease: Secondary | ICD-10-CM | POA: Insufficient documentation

## 2022-08-13 DIAGNOSIS — I6523 Occlusion and stenosis of bilateral carotid arteries: Secondary | ICD-10-CM

## 2022-08-13 DIAGNOSIS — Z794 Long term (current) use of insulin: Secondary | ICD-10-CM | POA: Insufficient documentation

## 2022-08-13 DIAGNOSIS — Z79899 Other long term (current) drug therapy: Secondary | ICD-10-CM | POA: Diagnosis not present

## 2022-08-13 DIAGNOSIS — Z7901 Long term (current) use of anticoagulants: Secondary | ICD-10-CM | POA: Diagnosis not present

## 2022-08-13 DIAGNOSIS — I4821 Permanent atrial fibrillation: Secondary | ICD-10-CM

## 2022-08-13 DIAGNOSIS — Z7982 Long term (current) use of aspirin: Secondary | ICD-10-CM | POA: Insufficient documentation

## 2022-08-13 DIAGNOSIS — Z7984 Long term (current) use of oral hypoglycemic drugs: Secondary | ICD-10-CM | POA: Diagnosis not present

## 2022-08-13 DIAGNOSIS — Z01812 Encounter for preprocedural laboratory examination: Secondary | ICD-10-CM | POA: Insufficient documentation

## 2022-08-13 DIAGNOSIS — E785 Hyperlipidemia, unspecified: Secondary | ICD-10-CM | POA: Insufficient documentation

## 2022-08-13 DIAGNOSIS — I2721 Secondary pulmonary arterial hypertension: Secondary | ICD-10-CM | POA: Diagnosis not present

## 2022-08-13 HISTORY — DX: Bradycardia, unspecified: R00.1

## 2022-08-13 LAB — COMPREHENSIVE METABOLIC PANEL
ALT: 23 U/L (ref 0–44)
AST: 29 U/L (ref 15–41)
Albumin: 3.9 g/dL (ref 3.5–5.0)
Alkaline Phosphatase: 77 U/L (ref 38–126)
Anion gap: 12 (ref 5–15)
BUN: 60 mg/dL — ABNORMAL HIGH (ref 8–23)
CO2: 28 mmol/L (ref 22–32)
Calcium: 9.3 mg/dL (ref 8.9–10.3)
Chloride: 101 mmol/L (ref 98–111)
Creatinine, Ser: 1.94 mg/dL — ABNORMAL HIGH (ref 0.61–1.24)
GFR, Estimated: 34 mL/min — ABNORMAL LOW (ref >60)
Glucose, Bld: 138 mg/dL — ABNORMAL HIGH (ref 70–99)
Potassium: 4.4 mmol/L (ref 3.5–5.1)
Sodium: 141 mmol/L (ref 135–145)
Total Bilirubin: 1.1 mg/dL (ref 0.3–1.2)
Total Protein: 6.9 g/dL (ref 6.5–8.1)

## 2022-08-13 LAB — CBC
HCT: 34.7 % — ABNORMAL LOW (ref 39.0–52.0)
Hemoglobin: 10.8 g/dL — ABNORMAL LOW (ref 13.0–17.0)
MCH: 30.7 pg (ref 26.0–34.0)
MCHC: 31.1 g/dL (ref 30.0–36.0)
MCV: 98.6 fL (ref 80.0–100.0)
Platelets: 188 10*3/uL (ref 150–400)
RBC: 3.52 MIL/uL — ABNORMAL LOW (ref 4.22–5.81)
RDW: 15 % (ref 11.5–15.5)
WBC: 6.7 10*3/uL (ref 4.0–10.5)
nRBC: 0 % (ref 0.0–0.2)

## 2022-08-13 LAB — TYPE AND SCREEN
ABO/RH(D): A POS
Antibody Screen: NEGATIVE

## 2022-08-13 LAB — URINALYSIS, ROUTINE W REFLEX MICROSCOPIC
Bilirubin Urine: NEGATIVE
Glucose, UA: 250 mg/dL — AB
Hgb urine dipstick: NEGATIVE
Ketones, ur: NEGATIVE mg/dL
Leukocytes,Ua: NEGATIVE
Nitrite: NEGATIVE
Protein, ur: NEGATIVE mg/dL
Specific Gravity, Urine: 1.01 (ref 1.005–1.030)
pH: 6 (ref 5.0–8.0)

## 2022-08-13 LAB — GLUCOSE, CAPILLARY: Glucose-Capillary: 126 mg/dL — ABNORMAL HIGH (ref 70–99)

## 2022-08-13 LAB — APTT: aPTT: 35 seconds (ref 24–36)

## 2022-08-13 LAB — SURGICAL PCR SCREEN
MRSA, PCR: NEGATIVE
Staphylococcus aureus: NEGATIVE

## 2022-08-13 LAB — PROTIME-INR
INR: 1.9 — ABNORMAL HIGH (ref 0.8–1.2)
Prothrombin Time: 21.2 seconds — ABNORMAL HIGH (ref 11.4–15.2)

## 2022-08-13 LAB — SARS CORONAVIRUS 2 (TAT 6-24 HRS): SARS Coronavirus 2: NEGATIVE

## 2022-08-13 NOTE — Patient Instructions (Signed)
Thank you for coming in today  No labs today  Your physician recommends that you schedule a follow-up appointment in:  4 weeks in clinic  3-4 months with Dr. Aundra Dubin    Do the following things EVERYDAY: Weigh yourself in the morning before breakfast. Write it down and keep it in a log. Take your medicines as prescribed Eat low salt foods--Limit salt (sodium) to 2000 mg per day.  Stay as active as you can everyday Limit all fluids for the day to less than 2 liters  At the [facility], you and your health needs are our priority. As part of our continuing mission to provide you with exceptional heart care, we have created designated Provider Care Teams. These Care Teams include your primary Cardiologist (physician) and Advanced Practice Providers (APPs- Physician Assistants and Nurse Practitioners) who all work together to provide you with the care you need, when you need it.   You may see any of the following providers on your designated Care Team at your next follow up: Dr Glori Bickers Dr Loralie Champagne Dr. Roxana Hires, NP Lyda Jester, [place] [facility] [place], [place] Forestine Na, NP Audry Riles, PharmD   Please be sure to bring in all your medications bottles to every appointment.   If you have any questions or concerns before your next appointment please send [place] a message through [place] or call our office at [phone].    TO LEAVE A MESSAGE FOR THE NURSE SELECT OPTION 2, PLEASE LEAVE A MESSAGE INCLUDING: YOUR NAME DATE OF BIRTH CALL BACK NUMBER REASON FOR CALL**this is important as we prioritize the call backs  YOU WILL RECEIVE A CALL BACK THE SAME DAY AS LONG AS YOU CALL BEFORE 4:00 PM

## 2022-08-13 NOTE — Progress Notes (Signed)
Called [facility] with abnormal lab results.

## 2022-08-13 NOTE — Progress Notes (Signed)
PCP - Monico Blitz MD Cardiologist - Domenic Polite  PPM/ICD - denies  Chest x-ray - n/a EKG - 07/23/22 Stress Test - 08/22/17 ECHO - 04/26/22 Cardiac Cath - 07/02/22  Sleep Study - Has one scheduled for December CPAP - no  Fasting Blood Sugar - 110s Checks Blood Sugar 2 times a day  Do not take dapagliflozin propanediol (FARXIGA) 3 days prior to surgery. Your last dose will be 08/16/22.  As of today, STOP taking any (unless otherwise instructed by your surgeon) Aleve, Naproxen, Ibuprofen, Motrin, Advil, Goody's, BC's, all herbal medications, fish oil, and all vitamins.   Stop taking warfarin (COUMADIN) 3 days prior to your surgery. Your last dose will be 08/16/22.   Continue taking clopidogrel (PLAVIX) and aspirin through the day of surgery.  ERAS Protcol - no PRE-SURGERY Ensure or G2- n/a  COVID TEST- pending   Anesthesia review: yes- cardiac history  Patient denies shortness of breath, fever, cough and chest pain at PAT appointment   All instructions explained to the patient, with a verbal understanding of the material. Patient agrees to go over the instructions while at home for a better understanding. Patient also instructed to self quarantine after being tested for COVID-19. The opportunity to ask questions was provided.

## 2022-08-13 NOTE — Telephone Encounter (Signed)
Spoke with patient's wife. Made aware of elevated INR level and informed of provider recommendations to hold Coumadin 5 days prior to surgery on 08/20/22. Patient will take last dose tomorrow, 08/14/22. Mrs. Buchta verbalized understanding.

## 2022-08-13 NOTE — Telephone Encounter (Signed)
-----   Message from Marty Heck, MD sent at 08/13/2022 12:24 PM EDT ----- Regarding: RE: Abnormal labs Thanks. I would ask that he hold his Coumadin 5 days prior. Otherwise should be ok to proceed.  Best  Gerald Stabs ----- Message ----- From: Nicholas Lose, RN Sent: 08/13/2022  11:32 AM EDT To: Marty Heck, MD Subject: Abnormal labs                                  Dr. Katheran Awe- Patient is scheduled for a right TCAR on 08/20/22. I received a message regarding abnormal preadmission labs: PT 21.2; INR 1.9; Cr 1.94; and UA was positive for glucose.   Pt is aware to hold coumadin 3 days prior. His Cr has slightly increased from 1.70 on 07/24/22. UA glucose has improved from 1 month ago.   Herma Ard

## 2022-08-14 ENCOUNTER — Encounter (HOSPITAL_COMMUNITY): Payer: Self-pay

## 2022-08-14 NOTE — Anesthesia Preprocedure Evaluation (Addendum)
Anesthesia Evaluation  Patient identified by MRN, date of birth, ID band Patient awake    Reviewed: Allergy & Precautions, NPO status , Patient's Chart, lab work & pertinent test results  Airway Mallampati: I  TM Distance: >3 FB Neck ROM: Full    Dental  (+) Teeth Intact, Dental Advisory Given   Pulmonary neg pulmonary ROS,    Pulmonary exam normal breath sounds clear to auscultation       Cardiovascular hypertension, Pt. on medications pulmonary hypertension(-) angina+ Peripheral Vascular Disease (Bilateral carotid stenosis) and +CHF  Normal cardiovascular exam+ dysrhythmias Atrial Fibrillation  Rhythm:Regular Rate:Normal  RHC 05/28/22: RHC Procedural Findings: Hemodynamics (mmHg) RA mean 8 RV 81/7 PA 85/40, mean 41 PCWP mean 22, prominent v waves to 29  Oxygen saturations: PA 69% AO 97%  Cardiac Output (Fick) 5.55  Cardiac Index (Fick) 2.86 PVR 3.4 WU  1. PCWP remains elevated.  2. 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 PA pressure remains high, but is significantly lower than on prior RHC.     Neuro/Psych negative neurological ROS  negative psych ROS   GI/Hepatic   Endo/Other  diabetes, Type 2  Renal/GU Renal InsufficiencyRenal disease     Musculoskeletal  (+) Arthritis ,   Abdominal   Peds  Hematology  (+) Blood dyscrasia (Plavix; Warfarin), ,   Anesthesia Other Findings   Reproductive/Obstetrics                           Anesthesia Physical Anesthesia Plan  ASA: 4  Anesthesia Plan: MAC   Post-op Pain Management: Tylenol PO (pre-op)*   Induction: Intravenous  PONV Risk Score and Plan: 1 and Dexamethasone and Ondansetron  Airway Management Planned: Natural Airway and Simple Face Mask  Additional Equipment: Arterial line  Intra-op Plan:   Post-operative Plan:   Informed Consent: I have reviewed the patients History and Physical, chart,  labs and discussed the procedure including the risks, benefits and alternatives for the proposed anesthesia with the patient or authorized representative who has indicated his/her understanding and acceptance.     Dental advisory given  Plan Discussed with: CRNA  Anesthesia Plan Comments: (See PAT note written 08/14/2022 by Myra Gianotti, PA-C. Posted for MAC anesthesia given severe pulmonary HTN history. Avoid AV nodal blocking agents given bradycardia. HF cardiologist is Dr. Aundra Dubin. Primary cardiologist is Dr. Domenic Polite. )      Anesthesia Quick Evaluation

## 2022-08-14 NOTE — Progress Notes (Signed)
Anesthesia Chart Review:  Case: [id number] Date/Time: 08/20/22 0915   Procedure: Right Transcarotid Artery Revascularization (Right)   Anesthesia type: Monitor Anesthesia Care   Pre-op diagnosis: Bilateral carotid stenosis   Location: [facility] OR ROOM 43 / [place] [facility]   Surgeons: Marty Heck, MD       DISCUSSION: Patient is an 81 year old male scheduled for the above procedure. 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    In late June 2023, he was being considered for staged carotid endarterectomies but referred back to cardiology due to increased dyspnea. Echo showed vigorous LVEF at 70 to 75% with LV septal flattening consistent with both RV pressure and volume overload, moderately dilated right ventricle with reduced contraction and severe pulmonary hypertension, estimated RVSP 120 mmHg, severe tricuspid regurgitation, pericardial effusion that is small to moderate, and sclerotic to mildly stenotic aortic valve. Invasive cardiac evaluation recommended.    RHC/LHC done on 04/30/22 showed no significant CAD, but RA mean 25, PA 115/30 (58), LVEDP 22 mmHg, PCWP not obtained, Fick CO/CI 6.4/3.1, PAPi 3.4. PVR 5.6 WU. 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 Procedure complicated by right groin hematoma requiring brief hold on anticoagulation therapy. He received Feraheme and 1 unit PRBC. 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 Discharge medications included Farxiga, spironolactone, Tadalafil, Torsemide, warfarin. (He did not tolerate Entresto at first due to hypotension but added later. Not on AV nodal blocking  agents due to bradycardia.)  Repeat RHC on 05/28/22 showed high, but significantly lower PA pressures with RA mean 8, PA 85/40 (41). Torsemide increased and started on Opsumit. Spironolactone temporarily held for hyperkalemia. Prior to left TCAR, Dr. Aundra Dubin had indicated to Dr. Carlis Abbott that he felt Mr. Schwenke was too high risk for general anesthesia. He underwent left TCAR on 07/02/22 under MAC. Current pulmonary hypertension medications include torsemide 40 mg BID, Entresto 24-26 mg BID, tadalafil 40 mg daily, Opsumit 10 mg daily, Farxiga 10 mg daily, spironolactone 12.5 mg daily, amlodipine 5 mg daily for HTN).    History includes never smoker, HTN, HLD, atrial fibrillation, chronic diastolic CHF, pulmonary hypertension, bradycardia, DM2, carotid artery stenosis (bilateral).   Last HF evaluation was on 08/13/22 with Allena Katz, [place]. Spironolactone had been resumed and torsemide increased on 07/23/22 for volume excess with weight up 18 lbs. Weight now down 10 lbs. Creatinine up to 1.94 (previously ~ 1.5-1.75 since June 2023). She did not adjust medications. Overall feeling well, no SOB with walking on flat ground, no palpitations, chest pain, dizziness, edema, PND/orthopnea. She noted plans for right TCAR next week. Pending rheumatology evaluation and sleep study. Recommendation to reassess volume at [facility] and 4 week APP follow-up.  Last evaluation with primary cardiology out-patient was on 06/14/22 with Dr. Domenic Polite, with last in-patient evaluation 07/03/22 with Dr. Sherren Mocha for post-operative bradycardia with known underlying bradycardia. (He is not on AV nodal blockers due to underlying conduction system disease.) HR primarily in the 40's during surgery but mostly in the 30's with brief dip to upper 20's overnight. Longest R-R interval was about 2.1 seconds with no evidence of high-grade heart block on telemetry review. He indicated that Dr. Domenic Polite had discussed possibility  that he may need a pacemaker  at some point, but Mr. Danielsen hoped to avoid unless absolutely necessary. Given known baseline bradycardia without symptoms, potential exacerbation of bradycardia related to carotid surgery and post-operative hematoma at incision site, decision made for continued observation. Cardiology did not feel there was any immediate need for PPM at that time.    INR was 1.9 on 08/13/22, so Dr. Carlis Abbott advised he hold warfarin for 5 days (instead of three days) prior to surgery. He is on warfarin for CVA prophylaxis. Dr. Carlis Abbott discussed starting back on ASA once his warfarin is held and continuing with Plavix through surgery.   Anesthesia team to re-evaluate on the day of surgery. As before, TCAR under MAC has been requested. Previously, cardiology also advised to avoid AV nodal blocking agents given bradycardia and not wanting to slow his HR further.  08/13/2022 COVID-19 test negative.   VS:  BP Readings from Last 3 Encounters:  08/13/22 (!) 138/54  07/31/22 (!) 173/65  07/23/22 (!) 160/70   Pulse Readings from Last 3 Encounters:  08/13/22 (!) 57  07/31/22 66  07/23/22 (!) 54     PROVIDERS: Monico Blitz, MD is PCP Rozann Lesches, MD is cardiologist Loralie Champagne, MD is HF cardiologist   LABS: Preoperative labs noted. See DISCUSSION. Repeat PT/INR on arrival. A1c 5.8% on 04/30/22.  (all labs ordered are listed, but only abnormal results are displayed)  Labs Reviewed  GLUCOSE, CAPILLARY - Abnormal; Notable for the following components:      Result Value   Glucose-Capillary 126 (*)    All other components within normal limits  CBC - Abnormal; Notable for the following components:   RBC 3.52 (*)    Hemoglobin 10.8 (*)    HCT 34.7 (*)    All other components within normal limits  COMPREHENSIVE METABOLIC PANEL - Abnormal; Notable for the following components:   Glucose, Bld 138 (*)    BUN 60 (*)    Creatinine, Ser 1.94 (*)    GFR, Estimated 34 (*)    All other components within normal  limits  PROTIME-INR - Abnormal; Notable for the following components:   Prothrombin Time 21.2 (*)    INR 1.9 (*)    All other components within normal limits  URINALYSIS, ROUTINE W REFLEX MICROSCOPIC - Abnormal; Notable for the following components:   Glucose, UA 250 (*)    All other components within normal limits  SARS CORONAVIRUS 2 (TAT 6-24 HRS)  SURGICAL PCR SCREEN  APTT  TYPE AND SCREEN    IMAGES: CT Chest (high resolution) 05/03/22: IMPRESSION: 1. Overall, the appearance of the chest is most indicative of congestive heart failure, as above. 2. The possibility of interstitial lung disease is not excluded, but not strongly favored at this time. If there is persistent clinical concern for interstitial lung disease, repeat high-resolution chest CT could be considered in 12 months to assess for temporal changes in the appearance of the lung parenchyma. 3. Dilatation of the pulmonic trunk (4.4 cm in diameter), compatible with reported clinical history of pulmonary arterial hypertension. 4. Small amount of pericardial fluid and/or thickening, unlikely to be of hemodynamic significance at this time. 5. Aortic atherosclerosis, in addition to three-vessel coronary artery disease. 6. There are calcifications of the mitral annulus. Echocardiographic correlation for evaluation of potential valvular dysfunction may be warranted if clinically indicated. - Aortic Atherosclerosis (ICD10-I70.0).     VQ Scan 05/01/22: IMPRESSION: Pulmonary embolism absent.   CTA Neck 04/12/22: IMPRESSION: 1. High-grade/Critical stenosis  of the bilateral proximal internal carotid arteries, right worse than left. 2. Patent vertebral arteries. 3. Right larger than left pleural effusions, incompletely imaged. Enlargement of the main pulmonary artery suggests pulmonary hypertension. 4. Mosaic attenuation in the lung apices suggests air trapping/small airway disease.     EKG:  EKG 07/23/22:  Wide QRS  rhythm at 49 bpm - no discernable atiral rhythm noted - Susptect Idioventricular escape rhythm Right bundle branch block T wave abnormality, consider inferior ischemia Abnormal ECG When compared with ECG of 03-Jul-2022 11:08, Wide QRS rhythm has replaced Atrial fibrillation Confirmed by Glenetta Hew [id number]) on 07/23/2022 11:21:35 PM  EKG 07/03/22: Atrial fibrillation with slow ventricular response at 41 bpm Right bundle branch block Possible Lateral infarct , age undetermined T wave abnormality, consider inferior ischemia Abnormal ECG When compared with ECG of 18-May-2022 14:11, No significant change was found Confirmed by Oswaldo Milian [phone]) on 07/05/2022 10:52:33 PM  EKG 05/18/22: Atrial fibrillation with slow ventricular response at 39 bpm Right bundle branch block T wave abnormality, consider inferior ischemia Abnormal ECG When compared with ECG of 30-Apr-2022 18:36, No significant change was found Confirmed by Ida Rogue [id number]) on 05/21/2022 10:32:52 PM     CV: US Carotid 07/31/22: Summary:  - Right Carotid: Velocities in the right ICA are consistent with a 40-59% stenosis. Acoustic shadowing caused by calcified plaque makes accurate measurement of stenosis technically difficult and true degree of stenosis may be greater. Non-hemodynamically significant plaque <50% noted in the CCA. The ECA appears >50% stenosed.  - Left Carotid: Velocities in the left ICA are consistent with a 40-59% stenosis. Acoustic shadowing caused by calcified plaque makes accurate measurement of stenosis technically difficult and true  degree of stenosis may be greater. Hemodynamically significant plaque >50% visualized in the CCA. The ECA appears >50% stenosed.  - Vertebrals:  Bilateral vertebral arteries demonstrate antegrade flow.  - Subclavians: Normal flow hemodynamics were seen in bilateral subclavian arteries.    [place] 05/28/22: RHC Procedural Findings: Hemodynamics (mmHg) RA mean  8 RV 81/7 PA 85/40, mean 41 PCWP mean 22, prominent v waves to 29  Oxygen saturations: PA 69% AO 97%  Cardiac Output (Fick) 5.55  Cardiac Index (Fick) 2.86 PVR 3.4 WU   1. PCWP remains elevated.  2. 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  PA pressure remains high, but is significantly lower than on prior RHC.    Increase torsemide to 20 daily alternating with 40 daily, needs to start Opsumit, hold Entresto and spironolactone for now with K higher.      RHC/LHC 04/30/22: Conclusions: No angiographically significant coronary artery disease. Severely elevated pulmonary artery and right heart filling pressures (mean PA 58 mmHg, mean RA 25 mmHg). Mildly elevated left heart filling pressure (LVEDP 22 mmHg). Normal Fick cardiac output/index (CO 6.4 L/min, CI 3.1 L/min/m). Small, heavily calcified right radial artery precluding catheter advancement from this approach.  Recommend alternative access if future catheterizations are needed.   Recommendations: Admit to telemetry for consultation with advanced heart failure team and optimization of severe pulmonary hypertension and right heart failure. Start IV heparin 8 hours after hemostasis achieved at right femoral arteriotomy site.  Defer reinitiation of warfarin until it is clear that invasive procedures are not needed during this admission.     Echo 04/26/22: IMPRESSIONS   1. Left ventricular ejection fraction, by estimation, is 70 to 75%. The  left ventricle has hyperdynamic function. The left ventricle has no  regional wall motion abnormalities. There is mild left ventricular  hypertrophy. Left ventricular diastolic  parameters are indeterminate. There is the interventricular septum is  flattened in systole and diastole, consistent with right ventricular  pressure and volume overload. The average left ventricular global  longitudinal strain is -24.0 %. The global  longitudinal strain is normal.   2. Right  ventricular systolic function is moderately reduced. The right  ventricular size is moderately enlarged. Mildly increased right  ventricular wall thickness. There is severely elevated pulmonary artery  systolic pressure. The estimated right  ventricular systolic pressure is 353.2 mmHg.   3. Left atrial size was moderately dilated.   4. Right atrial size was severely dilated.   5. Right pleural effusion noted. Moderate pericardial effusion. The  pericardial effusion is posterior to the left ventricle and localized near  the right atrium. Small collection anterolaterally.   6. The mitral valve is abnormal. Trivial mitral valve regurgitation.  Moderate mitral annular calcification.   7. Tricuspid valve regurgitation is severe.   8. The aortic valve is tricuspid. Aortic valve regurgitation is not  visualized. Aortic valve sclerosis/calcification is present, without any  evidence of aortic stenosis. Aortic valve mean gradient measures 6.7 mmHg.   9. The inferior vena cava is dilated in size with <50% respiratory  variability, suggesting right atrial pressure of 15 mmHg.  - Comparison(s): Prior images unable to be directly viewed.     Past Medical History:  Diagnosis Date   (HFpEF) heart failure with preserved ejection fraction (HCC)    Arthritis    Atrial fibrillation (HCC)    Bradycardia    Carotid artery disease ([place])    Dysrhythmia    Hyperlipidemia    Hypertension    Pulmonary hypertension ([place])    Type 2 diabetes mellitus ([place])     Past Surgical History:  Procedure Laterality Date   Cataract surgery     COLONOSCOPY     HERNIA REPAIR     RIGHT HEART CATH N/A 05/28/2022   Procedure: RIGHT HEART CATH;  Surgeon: Larey Dresser, MD;  Location: [facility];  Service: Cardiovascular;  Laterality: N/A;   RIGHT/LEFT HEART CATH AND CORONARY ANGIOGRAPHY N/A 04/30/2022   Procedure: RIGHT/LEFT HEART CATH AND CORONARY ANGIOGRAPHY;  Surgeon: Nelva Bush, MD;  Location: [facility];  Service: Cardiovascular;  Laterality: N/A;   TONSILLECTOMY     TRANSCAROTID ARTERY REVASCULARIZATION  Left 07/02/2022   Procedure: Transcarotid Artery Revascularization;  Surgeon: Marty Heck, MD;  Location: [facility];  Service: Vascular;  Laterality: Left;   ULTRASOUND GUIDANCE FOR VASCULAR ACCESS Right 07/02/2022   Procedure: ULTRASOUND GUIDANCE FOR VASCULAR ACCESS;  Surgeon: Marty Heck, MD;  Location: [facility];  Service: Vascular;  Laterality: Right;   YAG LASER APPLICATION Right 99/24/2683   Procedure: YAG LASER APPLICATION;  Surgeon: Williams Che, MD;  Location: [facility];  Service: Ophthalmology;  Laterality: Right;    MEDICATIONS:  amLODipine (NORVASC) 5 MG tablet   atorvastatin (LIPITOR) 40 MG tablet   cholecalciferol (VITAMIN D) 25 MCG (1000 UNIT) tablet   clopidogrel (PLAVIX) 75 MG tablet   dapagliflozin propanediol (FARXIGA) 10 MG TABS tablet   fish oil-omega-3 fatty acids 1000 MG capsule   macitentan (OPSUMIT) 10 MG tablet   Misc Natural Products (GLUCOS-CHONDROIT-MSM COMPLEX) TABS   Multiple Vitamins-Minerals (MULTIVITAMIN WITH MINERALS) tablet   niacin 500 MG tablet   oxyCODONE (OXY IR/ROXICODONE) 5 MG immediate release tablet   sacubitril-valsartan (ENTRESTO) 24-26 MG   spironolactone (ALDACTONE) 25 MG tablet   tadalafil, PAH, (  ADCIRCA) 20 MG tablet   torsemide (DEMADEX) 20 MG tablet   warfarin (COUMADIN) 5 MG tablet   No current facility-administered medications for this encounter.    Myra Gianotti, PA-C Surgical Short Stay/Anesthesiology [facility] Phone [phone] [facility] Phone [phone] 08/14/2022 12:36 PM

## 2022-08-20 ENCOUNTER — Inpatient Hospital Stay (HOSPITAL_COMMUNITY): Payer: Medicare PPO

## 2022-08-20 ENCOUNTER — Inpatient Hospital Stay (HOSPITAL_COMMUNITY): Payer: Medicare PPO | Admitting: Vascular Surgery

## 2022-08-20 ENCOUNTER — Inpatient Hospital Stay (HOSPITAL_COMMUNITY)
Admission: RE | Admit: 2022-08-20 | Discharge: 2022-08-21 | DRG: 035 | Disposition: A | Discharge status: Home / Self Care | Payer: Medicare PPO | Attending: Vascular Surgery | Admitting: Vascular Surgery

## 2022-08-20 ENCOUNTER — Encounter (HOSPITAL_COMMUNITY): Payer: Self-pay | Admitting: Vascular Surgery

## 2022-08-20 ENCOUNTER — Other Ambulatory Visit: Payer: Self-pay

## 2022-08-20 ENCOUNTER — Encounter (HOSPITAL_COMMUNITY)
Admission: RE | Disposition: A | Discharge status: Home / Self Care | Payer: Self-pay | Source: Home / Self Care | Attending: Vascular Surgery

## 2022-08-20 DIAGNOSIS — Z823 Family history of stroke: Secondary | ICD-10-CM | POA: Diagnosis not present

## 2022-08-20 DIAGNOSIS — L7632 Postprocedural hematoma of skin and subcutaneous tissue following other procedure: Secondary | ICD-10-CM | POA: Diagnosis not present

## 2022-08-20 DIAGNOSIS — D62 Acute posthemorrhagic anemia: Secondary | ICD-10-CM | POA: Diagnosis not present

## 2022-08-20 DIAGNOSIS — I6521 Occlusion and stenosis of right carotid artery: Principal | ICD-10-CM | POA: Diagnosis present

## 2022-08-20 DIAGNOSIS — I5032 Chronic diastolic (congestive) heart failure: Secondary | ICD-10-CM | POA: Diagnosis present

## 2022-08-20 DIAGNOSIS — J329 Chronic sinusitis, unspecified: Secondary | ICD-10-CM | POA: Diagnosis not present

## 2022-08-20 DIAGNOSIS — M1990 Unspecified osteoarthritis, unspecified site: Secondary | ICD-10-CM

## 2022-08-20 DIAGNOSIS — E119 Type 2 diabetes mellitus without complications: Secondary | ICD-10-CM | POA: Diagnosis not present

## 2022-08-20 DIAGNOSIS — E785 Hyperlipidemia, unspecified: Secondary | ICD-10-CM | POA: Diagnosis not present

## 2022-08-20 DIAGNOSIS — I509 Heart failure, unspecified: Secondary | ICD-10-CM | POA: Diagnosis not present

## 2022-08-20 DIAGNOSIS — Z79899 Other long term (current) drug therapy: Secondary | ICD-10-CM | POA: Diagnosis not present

## 2022-08-20 DIAGNOSIS — I272 Pulmonary hypertension, unspecified: Secondary | ICD-10-CM | POA: Diagnosis not present

## 2022-08-20 DIAGNOSIS — I110 Hypertensive heart disease with heart failure: Secondary | ICD-10-CM | POA: Diagnosis not present

## 2022-08-20 DIAGNOSIS — Z7901 Long term (current) use of anticoagulants: Secondary | ICD-10-CM | POA: Diagnosis not present

## 2022-08-20 DIAGNOSIS — I739 Peripheral vascular disease, unspecified: Secondary | ICD-10-CM | POA: Diagnosis not present

## 2022-08-20 DIAGNOSIS — Z7902 Long term (current) use of antithrombotics/antiplatelets: Secondary | ICD-10-CM

## 2022-08-20 DIAGNOSIS — I4891 Unspecified atrial fibrillation: Secondary | ICD-10-CM | POA: Diagnosis present

## 2022-08-20 DIAGNOSIS — I6523 Occlusion and stenosis of bilateral carotid arteries: Secondary | ICD-10-CM

## 2022-08-20 HISTORY — PX: ULTRASOUND GUIDANCE FOR VASCULAR ACCESS: SHX6516

## 2022-08-20 HISTORY — PX: TRANSCAROTID ARTERY REVASCULARIZATIONÂ: SHX6778

## 2022-08-20 LAB — CBC
HCT: 32.7 % — ABNORMAL LOW (ref 39.0–52.0)
Hemoglobin: 10.7 g/dL — ABNORMAL LOW (ref 13.0–17.0)
MCH: 30.9 pg (ref 26.0–34.0)
MCHC: 32.7 g/dL (ref 30.0–36.0)
MCV: 94.5 fL (ref 80.0–100.0)
Platelets: 130 10*3/uL — ABNORMAL LOW (ref 150–400)
RBC: 3.46 MIL/uL — ABNORMAL LOW (ref 4.22–5.81)
RDW: 14.7 % (ref 11.5–15.5)
WBC: 10.2 10*3/uL (ref 4.0–10.5)

## 2022-08-20 LAB — GLUCOSE, CAPILLARY
Glucose-Capillary: 116 mg/dL — ABNORMAL HIGH (ref 70–99)
Glucose-Capillary: 166 mg/dL — ABNORMAL HIGH (ref 70–99)
Glucose-Capillary: 164 mg/dL — ABNORMAL HIGH (ref 70–99)
Glucose-Capillary: 154 mg/dL — ABNORMAL HIGH (ref 70–99)

## 2022-08-20 LAB — HEPATIC FUNCTION PANEL
ALT: 18 U/L (ref 0–44)
AST: 27 U/L (ref 15–41)
Albumin: 3.5 g/dL (ref 3.5–5.0)
Alkaline Phosphatase: 68 U/L (ref 38–126)
Bilirubin, Direct: 0.3 mg/dL — ABNORMAL HIGH (ref 0.0–0.2)
Indirect Bilirubin: 0.5 mg/dL (ref 0.3–0.9)
Total Bilirubin: 0.8 mg/dL (ref 0.3–1.2)
Total Protein: 6.4 g/dL — ABNORMAL LOW (ref 6.5–8.1)

## 2022-08-20 LAB — CREATININE, SERUM
Creatinine, Ser: 1.77 mg/dL — ABNORMAL HIGH (ref 0.61–1.24)
GFR, Estimated: 38 mL/min — ABNORMAL LOW (ref >60)

## 2022-08-20 LAB — PROTIME-INR
INR: 1.2 (ref 0.8–1.2)
Prothrombin Time: 15.5 seconds — ABNORMAL HIGH (ref 11.4–15.2)

## 2022-08-20 SURGERY — TRANSCAROTID ARTERY REVASCULARIZATION (TCAR)
Anesthesia: Monitor Anesthesia Care | Site: Groin | Laterality: Right

## 2022-08-20 MED ORDER — LABETALOL HCL 5 MG/ML IV SOLN: 10.0000 mg | INTRAVENOUS | Status: DC | PRN

## 2022-08-20 MED ORDER — ONDANSETRON HCL 4 MG/2ML IJ SOLN: 4.0000 mg | Freq: Once | INTRAMUSCULAR | Status: DC | PRN

## 2022-08-20 MED ORDER — FENTANYL CITRATE (PF) 250 MCG/5ML IJ SOLN
INTRAMUSCULAR | Status: AC
  Filled 2022-08-20: qty 5

## 2022-08-20 MED ORDER — LIDOCAINE-EPINEPHRINE (PF) 1 %-1:200000 IJ SOLN
INTRAMUSCULAR | Status: AC
  Filled 2022-08-20: qty 30

## 2022-08-20 MED ORDER — MIDAZOLAM HCL 2 MG/2ML IJ SOLN
INTRAMUSCULAR | Status: DC | PRN
  Administered 2022-08-20: .5 mg via INTRAVENOUS
  Administered 2022-08-20: 1 mg via INTRAVENOUS

## 2022-08-20 MED ORDER — ADULT MULTIVITAMIN W/MINERALS CH
1.0000 | ORAL_TABLET | Freq: Every morning | ORAL | Status: DC
  Administered 2022-08-21: 1 via ORAL
  Filled 2022-08-20: qty 1

## 2022-08-20 MED ORDER — METOPROLOL TARTRATE 5 MG/5ML IV SOLN: 2.0000 mg | INTRAVENOUS | Status: DC | PRN

## 2022-08-20 MED ORDER — MACITENTAN 10 MG PO TABS
10.0000 mg | ORAL_TABLET | Freq: Every day | ORAL | Status: DC
  Administered 2022-08-21: 10 mg via ORAL
  Filled 2022-08-20: qty 1

## 2022-08-20 MED ORDER — SPIRONOLACTONE 12.5 MG HALF TABLET
12.5000 mg | ORAL_TABLET | Freq: Every day | ORAL | Status: DC
  Administered 2022-08-20 – 2022-08-21 (×2): 12.5 mg via ORAL
  Filled 2022-08-20 (×2): qty 1

## 2022-08-20 MED ORDER — CLOPIDOGREL BISULFATE 75 MG PO TABS
75.0000 mg | ORAL_TABLET | Freq: Every day | ORAL | Status: DC
  Administered 2022-08-21: 75 mg via ORAL
  Filled 2022-08-20: qty 1

## 2022-08-20 MED ORDER — POLYETHYLENE GLYCOL 3350 17 G PO PACK: 17.0000 g | PACK | Freq: Every day | ORAL | Status: DC | PRN

## 2022-08-20 MED ORDER — OXYCODONE HCL 5 MG PO TABS
5.0000 mg | ORAL_TABLET | ORAL | Status: DC | PRN
  Administered 2022-08-20: 5 mg via ORAL

## 2022-08-20 MED ORDER — HEPARIN SODIUM (PORCINE) 1000 UNIT/ML IJ SOLN
INTRAMUSCULAR | Status: DC | PRN
  Administered 2022-08-20: 9000 [IU] via INTRAVENOUS

## 2022-08-20 MED ORDER — CHLORHEXIDINE GLUCONATE 0.12 % MT SOLN: 15.0000 mL | Freq: Once | OROMUCOSAL | Status: AC

## 2022-08-20 MED ORDER — ALUM & MAG HYDROXIDE-SIMETH 200-200-20 MG/5ML PO SUSP: 15.0000 mL | ORAL | Status: DC | PRN

## 2022-08-20 MED ORDER — PHENOL 1.4 % MT LIQD: 1.0000 | OROMUCOSAL | Status: DC | PRN

## 2022-08-20 MED ORDER — FENTANYL CITRATE (PF) 250 MCG/5ML IJ SOLN
INTRAMUSCULAR | Status: DC | PRN
  Administered 2022-08-20 (×2): 25 ug via INTRAVENOUS

## 2022-08-20 MED ORDER — CEFAZOLIN SODIUM-DEXTROSE 2-4 GM/100ML-% IV SOLN
INTRAVENOUS | Status: AC
  Filled 2022-08-20: qty 100

## 2022-08-20 MED ORDER — DOCUSATE SODIUM 100 MG PO CAPS
100.0000 mg | ORAL_CAPSULE | Freq: Every day | ORAL | Status: DC
  Administered 2022-08-21: 100 mg via ORAL
  Filled 2022-08-20: qty 1

## 2022-08-20 MED ORDER — PROTAMINE SULFATE 10 MG/ML IV SOLN
INTRAVENOUS | Status: DC | PRN
  Administered 2022-08-20 (×5): 10 mg via INTRAVENOUS

## 2022-08-20 MED ORDER — GUAIFENESIN-DM 100-10 MG/5ML PO SYRP: 15.0000 mL | ORAL_SOLUTION | ORAL | Status: DC | PRN

## 2022-08-20 MED ORDER — INSULIN ASPART 100 UNIT/ML IJ SOLN: 0.0000 [IU] | INTRAMUSCULAR | Status: DC | PRN

## 2022-08-20 MED ORDER — HEPARIN 6000 UNIT IRRIGATION SOLUTION
Status: AC
  Filled 2022-08-20: qty 500

## 2022-08-20 MED ORDER — FENTANYL CITRATE (PF) 100 MCG/2ML IJ SOLN
INTRAMUSCULAR | Status: AC
  Administered 2022-08-20: 25 ug via INTRAVENOUS
  Filled 2022-08-20: qty 2

## 2022-08-20 MED ORDER — ACETAMINOPHEN 500 MG PO TABS
ORAL_TABLET | ORAL | Status: AC
  Administered 2022-08-20: 1000 mg via ORAL
  Filled 2022-08-20: qty 2

## 2022-08-20 MED ORDER — ASPIRIN 81 MG PO TBEC
81.0000 mg | DELAYED_RELEASE_TABLET | Freq: Every day | ORAL | Status: DC
  Administered 2022-08-21: 81 mg via ORAL
  Filled 2022-08-20: qty 1

## 2022-08-20 MED ORDER — AMLODIPINE BESYLATE 5 MG PO TABS
5.0000 mg | ORAL_TABLET | Freq: Every day | ORAL | Status: DC
  Administered 2022-08-21: 5 mg via ORAL
  Filled 2022-08-20: qty 1

## 2022-08-20 MED ORDER — TADALAFIL 20 MG PO TABS
40.0000 mg | ORAL_TABLET | Freq: Every day | ORAL | Status: DC
  Administered 2022-08-21: 40 mg via ORAL
  Filled 2022-08-20 (×2): qty 2

## 2022-08-20 MED ORDER — MAGNESIUM SULFATE 2 GM/50ML IV SOLN: 2.0000 g | Freq: Every day | INTRAVENOUS | Status: DC | PRN

## 2022-08-20 MED ORDER — HYDRALAZINE HCL 20 MG/ML IJ SOLN
5.0000 mg | Freq: Once | INTRAMUSCULAR | Status: AC
  Administered 2022-08-20: 5 mg via INTRAVENOUS

## 2022-08-20 MED ORDER — ORAL CARE MOUTH RINSE: 15.0000 mL | Freq: Once | OROMUCOSAL | Status: AC

## 2022-08-20 MED ORDER — HEPARIN SODIUM (PORCINE) 5000 UNIT/ML IJ SOLN
5000.0000 [IU] | Freq: Three times a day (TID) | INTRAMUSCULAR | Status: DC
  Administered 2022-08-21: 5000 [IU] via SUBCUTANEOUS
  Filled 2022-08-20: qty 1

## 2022-08-20 MED ORDER — LIDOCAINE-EPINEPHRINE (PF) 1 %-1:200000 IJ SOLN
INTRAMUSCULAR | Status: DC | PRN
  Administered 2022-08-20: 22 mL

## 2022-08-20 MED ORDER — ACETAMINOPHEN 325 MG PO TABS: 325.0000 mg | ORAL_TABLET | ORAL | Status: DC | PRN

## 2022-08-20 MED ORDER — ATORVASTATIN CALCIUM 40 MG PO TABS: 40.0000 mg | ORAL_TABLET | Freq: Every day | ORAL | Status: DC

## 2022-08-20 MED ORDER — CHLORHEXIDINE GLUCONATE CLOTH 2 % EX PADS: 6.0000 | MEDICATED_PAD | Freq: Once | CUTANEOUS | Status: DC

## 2022-08-20 MED ORDER — CEFAZOLIN SODIUM-DEXTROSE 2-4 GM/100ML-% IV SOLN
2.0000 g | Freq: Three times a day (TID) | INTRAVENOUS | Status: AC
  Administered 2022-08-20 – 2022-08-21 (×2): 2 g via INTRAVENOUS
  Filled 2022-08-20 (×2): qty 100

## 2022-08-20 MED ORDER — IODIXANOL 320 MG/ML IV SOLN
INTRAVENOUS | Status: DC | PRN
  Administered 2022-08-20: 20 mL via INTRA_ARTERIAL

## 2022-08-20 MED ORDER — LIDOCAINE HCL (PF) 1 % IJ SOLN
INTRAMUSCULAR | Status: AC
  Filled 2022-08-20: qty 5

## 2022-08-20 MED ORDER — ACETAMINOPHEN 500 MG PO TABS: 1000.0000 mg | ORAL_TABLET | Freq: Once | ORAL | Status: AC

## 2022-08-20 MED ORDER — BISACODYL 10 MG RE SUPP: 10.0000 mg | Freq: Every day | RECTAL | Status: DC | PRN

## 2022-08-20 MED ORDER — LACTATED RINGERS IV SOLN: INTRAVENOUS | Status: DC

## 2022-08-20 MED ORDER — HEMOSTATIC AGENTS (NO CHARGE) OPTIME
TOPICAL | Status: DC | PRN
  Administered 2022-08-20 (×2): 1 via TOPICAL

## 2022-08-20 MED ORDER — SODIUM CHLORIDE 0.9 % IV SOLN: 500.0000 mL | Freq: Once | INTRAVENOUS | Status: DC | PRN

## 2022-08-20 MED ORDER — INSULIN ASPART 100 UNIT/ML IJ SOLN
0.0000 [IU] | Freq: Three times a day (TID) | INTRAMUSCULAR | Status: DC
  Administered 2022-08-20: 3 [IU] via SUBCUTANEOUS
  Administered 2022-08-21: 2 [IU] via SUBCUTANEOUS

## 2022-08-20 MED ORDER — ACETAMINOPHEN 650 MG RE SUPP: 325.0000 mg | RECTAL | Status: DC | PRN

## 2022-08-20 MED ORDER — CHLORHEXIDINE GLUCONATE 0.12 % MT SOLN
OROMUCOSAL | Status: AC
  Administered 2022-08-20: 15 mL via OROMUCOSAL
  Filled 2022-08-20: qty 15

## 2022-08-20 MED ORDER — GLYCOPYRROLATE 0.2 MG/ML IJ SOLN
INTRAMUSCULAR | Status: DC | PRN
  Administered 2022-08-20: .2 mg via INTRAVENOUS

## 2022-08-20 MED ORDER — MORPHINE SULFATE (PF) 2 MG/ML IV SOLN
2.0000 mg | INTRAVENOUS | Status: DC | PRN
  Administered 2022-08-20: 2 mg via INTRAVENOUS
  Filled 2022-08-20: qty 1

## 2022-08-20 MED ORDER — NIACIN 500 MG PO TABS
500.0000 mg | ORAL_TABLET | Freq: Every morning | ORAL | Status: DC
  Administered 2022-08-21: 500 mg via ORAL
  Filled 2022-08-20: qty 1

## 2022-08-20 MED ORDER — HYDRALAZINE HCL 20 MG/ML IJ SOLN
INTRAMUSCULAR | Status: AC
  Filled 2022-08-20: qty 1

## 2022-08-20 MED ORDER — CEFAZOLIN SODIUM-DEXTROSE 2-4 GM/100ML-% IV SOLN
2.0000 g | INTRAVENOUS | Status: AC
  Administered 2022-08-20: 2 g via INTRAVENOUS

## 2022-08-20 MED ORDER — HYDRALAZINE HCL 20 MG/ML IJ SOLN: 5.0000 mg | INTRAMUSCULAR | Status: DC | PRN

## 2022-08-20 MED ORDER — SODIUM CHLORIDE 0.9 % IV SOLN: INTRAVENOUS | Status: DC

## 2022-08-20 MED ORDER — HEPARIN 6000 UNIT IRRIGATION SOLUTION
Status: DC | PRN
  Administered 2022-08-20: 1

## 2022-08-20 MED ORDER — FENTANYL CITRATE (PF) 100 MCG/2ML IJ SOLN: 25.0000 ug | INTRAMUSCULAR | Status: DC | PRN

## 2022-08-20 MED ORDER — 0.9 % SODIUM CHLORIDE (POUR BTL) OPTIME
TOPICAL | Status: DC | PRN
  Administered 2022-08-20: 1000 mL

## 2022-08-20 MED ORDER — PANTOPRAZOLE SODIUM 40 MG PO TBEC
40.0000 mg | DELAYED_RELEASE_TABLET | Freq: Every day | ORAL | Status: DC
  Administered 2022-08-21: 40 mg via ORAL
  Filled 2022-08-20: qty 1

## 2022-08-20 MED ORDER — TORSEMIDE 20 MG PO TABS
40.0000 mg | ORAL_TABLET | Freq: Two times a day (BID) | ORAL | Status: DC
  Administered 2022-08-20 – 2022-08-21 (×2): 40 mg via ORAL
  Filled 2022-08-20 (×2): qty 2

## 2022-08-20 MED ORDER — OXYCODONE HCL 5 MG PO TABS
ORAL_TABLET | ORAL | Status: AC
  Filled 2022-08-20: qty 1

## 2022-08-20 MED ORDER — SACUBITRIL-VALSARTAN 24-26 MG PO TABS
1.0000 | ORAL_TABLET | Freq: Two times a day (BID) | ORAL | Status: DC
  Administered 2022-08-20 – 2022-08-21 (×2): 1 via ORAL
  Filled 2022-08-20 (×2): qty 1

## 2022-08-20 MED ORDER — ONDANSETRON HCL 4 MG/2ML IJ SOLN: 4.0000 mg | Freq: Four times a day (QID) | INTRAMUSCULAR | Status: DC | PRN

## 2022-08-20 MED ORDER — POTASSIUM CHLORIDE CRYS ER 20 MEQ PO TBCR: 20.0000 meq | EXTENDED_RELEASE_TABLET | Freq: Every day | ORAL | Status: DC | PRN

## 2022-08-20 MED ORDER — DAPAGLIFLOZIN PROPANEDIOL 10 MG PO TABS
10.0000 mg | ORAL_TABLET | Freq: Every day | ORAL | Status: DC
  Administered 2022-08-20 – 2022-08-21 (×2): 10 mg via ORAL
  Filled 2022-08-20 (×2): qty 1

## 2022-08-20 MED ORDER — MIDAZOLAM HCL 2 MG/2ML IJ SOLN
INTRAMUSCULAR | Status: AC
  Filled 2022-08-20: qty 2

## 2022-08-20 MED ORDER — GLUCOS-CHONDROIT-MSM COMPLEX PO TABS: 2.0000 | ORAL_TABLET | Freq: Every day | ORAL | Status: DC

## 2022-08-20 SURGICAL SUPPLY — 49 items
BAG BANDED W/RUBBER/TAPE 36X54 (MISCELLANEOUS) ×2 IMPLANT
BAG COUNTER SPONGE SURGICOUNT (BAG) ×2 IMPLANT
BALLN STERLING RX 6X30X80 (BALLOONS) ×2
BALLN STERLING RX 7X15X80 (BALLOONS) ×2
BALLOON STERLING RX 6X30X80 (BALLOONS) IMPLANT
BALLOON STERLING RX 7X15X80 (BALLOONS) IMPLANT
CANISTER SUCT 3000ML PPV (MISCELLANEOUS) ×2 IMPLANT
CLIP VESOCCLUDE MED 6/CT (CLIP) ×2 IMPLANT
CLIP VESOCCLUDE SM WIDE 6/CT (CLIP) ×2 IMPLANT
COVER DOME SNAP 22 D (MISCELLANEOUS) ×2 IMPLANT
COVER PROBE W GEL 5X96 (DRAPES) ×2 IMPLANT
DERMABOND ADVANCED .7 DNX12 (GAUZE/BANDAGES/DRESSINGS) ×2 IMPLANT
DERMABOND ADVANCED .7 DNX6 (GAUZE/BANDAGES/DRESSINGS) IMPLANT
DRAPE FEMORAL ANGIO 80X135IN (DRAPES) ×2 IMPLANT
ELECT REM PT RETURN 9FT ADLT (ELECTROSURGICAL) ×2
ELECTRODE REM PT RTRN 9FT ADLT (ELECTROSURGICAL) ×2 IMPLANT
GLOVE BIO SURGEON STRL SZ7.5 (GLOVE) ×2 IMPLANT
GOWN STRL REUS W/ TWL LRG LVL3 (GOWN DISPOSABLE) ×4 IMPLANT
GOWN STRL REUS W/ TWL XL LVL3 (GOWN DISPOSABLE) ×2 IMPLANT
GOWN STRL REUS W/TWL LRG LVL3 (GOWN DISPOSABLE) ×4
GOWN STRL REUS W/TWL XL LVL3 (GOWN DISPOSABLE) ×2
GUIDEWIRE ENROUTE 0.014 (WIRE) ×2 IMPLANT
HEMOSTAT SNOW SURGICEL 2X4 (HEMOSTASIS) IMPLANT
INTRODUCER KIT GALT 7CM (INTRODUCER) ×2
KIT BASIN OR (CUSTOM PROCEDURE TRAY) ×2 IMPLANT
KIT ENCORE 26 ADVANTAGE (KITS) ×2 IMPLANT
KIT INTRODUCER GALT 7 (INTRODUCER) ×2 IMPLANT
KIT TURNOVER KIT B (KITS) ×2 IMPLANT
NDL HYPO 25GX1X1/2 BEV (NEEDLE) IMPLANT
NEEDLE HYPO 25GX1X1/2 BEV (NEEDLE) ×2 IMPLANT
PACK CAROTID (CUSTOM PROCEDURE TRAY) ×2 IMPLANT
POSITIONER HEAD DONUT 9IN (MISCELLANEOUS) ×2 IMPLANT
PROTECTION STATION PRESSURIZED (MISCELLANEOUS) ×2
SET MICROPUNCTURE 5F STIFF (MISCELLANEOUS) ×2 IMPLANT
STATION PROTECTION PRESSURIZED (MISCELLANEOUS) ×2 IMPLANT
STENT TRANSCAROTID SYSTEM 9X40 (Permanent Stent) IMPLANT
SUT MNCRL AB 4-0 PS2 18 (SUTURE) ×2 IMPLANT
SUT PROLENE 5 0 C 1 24 (SUTURE) ×2 IMPLANT
SUT SILK 2 0 PERMA HAND 18 BK (SUTURE) ×2 IMPLANT
SUT VIC AB 3-0 SH 27 (SUTURE) ×2
SUT VIC AB 3-0 SH 27X BRD (SUTURE) ×2 IMPLANT
SYR 10ML LL (SYRINGE) ×6 IMPLANT
SYR 20ML LL LF (SYRINGE) ×2 IMPLANT
SYR CONTROL 10ML LL (SYRINGE) IMPLANT
SYSTEM TRANSCAROTID NEUROPRTCT (MISCELLANEOUS) ×2 IMPLANT
TOWEL GREEN STERILE (TOWEL DISPOSABLE) ×2 IMPLANT
TRANSCAROTID NEUROPROTECT SYS (MISCELLANEOUS) ×2
WATER STERILE IRR 1000ML POUR (IV SOLUTION) ×2 IMPLANT
WIRE BENTSON .035X145CM (WIRE) ×2 IMPLANT

## 2022-08-20 NOTE — Progress Notes (Signed)
PHARMACIST LIPID MONITORING   Robert Baldwin is a 81 y.o. male admitted on 08/20/2022 with Bilateral carotid stenosis and planned R TCAR 10/30.  Pharmacy has been consulted to optimize lipid-lowering therapy with the indication of secondary prevention for clinical ASCVD.  Recent Labs:  Lipid Panel (last 6 months):   Lab Results  Component Value Date   CHOL 103 07/03/2022   TRIG 38 07/03/2022   HDL 39 (L) 07/03/2022   CHOLHDL 2.6 07/03/2022   VLDL 8 07/03/2022   LDLCALC 56 07/03/2022    Hepatic function panel (last 6 months):   Lab Results  Component Value Date   AST 29 08/13/2022   ALT 23 08/13/2022   ALKPHOS 77 08/13/2022   BILITOT 1.1 08/13/2022   BILIDIR 0.2 07/02/2022   IBILI 0.7 07/02/2022    SCr (since admission):   Serum creatinine: 1.94 mg/dL (H) 08/13/22 0848 Estimated creatinine clearance: 31.2 mL/min (A)  Current therapy and lipid therapy tolerance Current lipid-lowering therapy: atorvastatin 40mg  Previous lipid-lowering therapies (if applicable): none Documented or reported allergies or intolerances to lipid-lowering therapies (if applicable): none  Assessment:   Patient prefers no changes in lipid-lowering therapy at this time due to LDL at goal and already on high intensity statin   Plan:    1.Statin intensity (high intensity recommended for all patients regardless of the LDL):  No statin changes. The patient is already on a high intensity statin.  2.Add ezetimibe (if any one of the following):   Not indicated at this time.  3.Refer to lipid clinic:   No  4.Follow-up with:  Cardiology provider - Rozann Lesches, MD  5.Follow-up labs after discharge:  No changes in lipid therapy, repeat a lipid panel in one year.      Benetta Spar, PharmD, BCPS, BCCP Clinical Pharmacist  Please check AMION for all [facility] phone numbers After 10:00 PM, call [place] [phone]

## 2022-08-20 NOTE — Transfer of Care (Signed)
Immediate Anesthesia Transfer of Care Note  Patient: Robert Baldwin  Procedure(s) Performed: Right Transcarotid Artery Revascularization (Right: Groin) ULTRASOUND GUIDANCE FOR VASCULAR ACCESS (Left: Groin)  Patient Location: PACU  Anesthesia Type:MAC  Level of Consciousness: drowsy and patient cooperative  Airway & Oxygen Therapy: Patient Spontanous Breathing  Post-op Assessment: Report given to RN and Post -op Vital signs reviewed and stable  Post vital signs: Reviewed and stable  Last Vitals:  Vitals Value Taken Time  BP 150/46 08/20/22 1108  Temp    Pulse 69 08/20/22 1112  Resp 19 08/20/22 1112  SpO2 93 % 08/20/22 1112  Vitals shown include unvalidated device data.  Last Pain:  Vitals:   08/20/22 0728  TempSrc:   PainSc: 0-No pain         Complications: No notable events documented.

## 2022-08-20 NOTE — Op Note (Addendum)
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  Ultrasound-guided access left common femoral vein for delivery of venous TCAR sheath 2.  Transcatheter placement of intravascular stent in the right cervical carotid artery including angioplasty with distal embolic protection using flow reversal (right TCAR)  Surgeon: Dr. Marty Heck, MD  Assistant: Vicente Serene, PA  Indications: 81 year old male initially evaluated with asymptomatic bilateral high-grade ICA stenosis greater than 80%.  He recently underwent a left TCAR and did well and now presents for intervention on the contralateral right high-grade internal carotid artery stenosis.  Risks benefits discussed.  Findings: No general anesthesia given high cardiac risk with severe pulmonary hypertension.  Left common femoral vein was accessed under ultrasound guidance for placement of the venous TCAR sheath.  Transverse incision above the right clavicle where we dissected out the common carotid artery and ultimately after a pursestring was placed we placed the arterial working sheath.  After we went on active flow reversal, the lesion was crossed in the proximal right ICA where there was greater than 80% stenosis and this was angioplastied with a 6 mm x 30 mm angioplasty balloon and stented with a 9 mm x 40 mm Enroute stent and then postdilated with a 7 mm x 15 mm angioplasty balloon.  Less than 30% residual stenosis at completion.  Anesthesia: Local with MAC  Details: Patient was taken to the operating room after informed consent was obtained.  Placed on the operative table in supine position.  No general anesthesia was induced given high risk from a cardiac standpoint.  The right neck and bilateral groins were prepped and draped in standard sterile fashion.  Patient got antibiotics.  Timeout was performed.   Initially evaluated the left common femoral vein with ultrasound, it was patent, an image was saved.  This was accessed under ultrasound guidance with a micro access needle and placed a microwire and then a microsheath.  I then advanced a Bentson wire and exchanged for the TCAR venous sheath in the left common femoral vein and the sheath flushed and aspirated easily.  Then turned our attention to the right neck where a transverse incision was made 1 fingerbreadth above the right clavicle over the carotid artery.  We did place about 15 mL of 1% lidocaine with epinephrine in the neck prior to making incision.  The platysma was opened with Bovie cautery.  We then performed a muscle-sparing technique between the heads of the sternocleidomastoid and the mobilized the jugular vein lateral and ultimately we got control of the common carotid artery and placed a vessel loop as well as an umbilical tape.  The vagus nerve was protected.  A 5-0 pursestring was placed on the anterior wall of the common carotid artery with Prolene.  Patient was given 100 units/kg IV heparin.  An ACT was checked to maintain greater than 250.  The pursestring was then accessed with a micro access needle and placed a microwire and then a microsheath.  We then got a right carotid angiogram to identify the carotid bifurcation and the lesion in the internal carotid artery.  I advanced a J-wire to just below the carotid bifurcation and then upsized to the working arterial sheath in the common carotid artery.  We then went on passive flow reversal and connected the filter to the venous sheath in the groin.  We then got additional imaging of the carotid bifurcation and lesion  in the internal carotid artery.  We performed a TCAR timeout.  We then clamped the common carotid artery below the arterial sheath for active flow reversal.  We confirmed that we had good flow in the sheath.  The lesion was then crossed with a wire into the internal carotid artery  and this was angioplastied with a 6 mm x 30 mm angioplasty balloon.  I then stented this with a 9 mm x 40 mm Enroute stent in the distal common carotid/proximal internal carotid artery.  There was greater than 30% residual stenosis after 2 minutes of active flow reversal.  We performed a post stent dilation with a 7 mm x 15 mm angioplasty balloon.  Less than 30% residual stenosis.  Excellent results.  Allowed additional 2 minutes of flow reversal.  Wire was removed and we came off active clamp.  The sheath was removed from the common carotid artery and tied down the pursestring with good hemostasis.  I did place an additional 6-0 Prolene.  We listened with pencil Doppler and had good flow in the carotid artery.  The patient was neuro intact and had an intact neurologic exam.  50 mg protamine was given for reversal.  Irrigated the neck incision and used Surgicel snow for hemostasis and the platysma was closed with 3-0 Vicryl and the skin was closed with 4-0 Monocryl and Dermabond.  The sheath in the left common femoral vein was removed and manual pressure was held for 5 minutes with good hemostasis.  Patient was taken to the recovery room neurologically intact.  Complication: None  Condition: Stable  Marty Heck, MD [facility] Office: [place]

## 2022-08-20 NOTE — Progress Notes (Signed)
Pharmacy Consult for Pulmonary Hypertension Treatment   Indication - Continuation of prior to admission medication   Patient is 81 y.o.  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   Continuing this medication order as an inpatient requires that monitoring parameters per REMS requirements must be met.  Chronic therapy is under the supervision of Dr. Aundra Dubin who is enrolled in the REMS program and is being notified of continuation of therapy. A staff message in EPIC has been sent notifying the certified prescriber.  Per patient report has previously been educated on Hepatotoxicity . On admission pregnancy risk has been assessed and no monitoring required. Hepatic function has been evaluated. AST/ALT appropriate to continue medication at this time.     Latest Ref Rng & Units 08/13/2022    8:48 AM 07/02/2022    2:54 PM 06/27/2022   11:14 AM  Hepatic Function  Total Protein 6.5 - 8.1 g/dL 6.9  6.2  7.0   Albumin 3.5 - 5.0 g/dL 3.9  3.1  3.6   AST 15 - 41 U/L _0 ALT 0 - 44 U/L _1 Alk Phosphatase 38 - 126 U/L 77  72  83   Total Bilirubin 0.3 - 1.2 mg/dL 1.1  0.9  0.8   Bilirubin, Direct 0.0 - 0.2 mg/dL  0.2      If any question arise or pregnancy is identified during hospitalization, contact for bosentan: [phone]; macitentan: [phone]; ambrisentan: [phone].  Thank for you allowing [place] to participate in the care of this patient.   Benetta Spar, PharmD, BCPS, BCCP Clinical Pharmacist  Please check AMION for all [facility] phone numbers After 10:00 PM, call [place] [phone]

## 2022-08-20 NOTE — H&P (Signed)
History and Physical Interval Note:  08/20/2022 9:17 AM  Robert Baldwin  has presented today for surgery, with the diagnosis of Bilateral carotid stenosis.  The various methods of treatment have been discussed with the patient and family. After consideration of risks, benefits and other options for treatment, the patient has consented to  Procedure(s): Right Transcarotid Artery Revascularization (Right) as a surgical intervention.  The patient's history has been reviewed, patient examined, no change in status, stable for surgery.  I have reviewed the patient's chart and labs.  Questions were answered to the patient's satisfaction.    Right TCAR.  Discussed risk and benefits including 1% perioperative stroke risk.    Cephus Shelling      Patient name: Robert Baldwin  MRN: [id number]        DOB: [birth year]          Sex: male   REASON FOR VISIT: Post-op after TCAR   HPI: Robert Baldwin is a 81 y.o. male with history of atrial fibrillation on Coumadin as well as severe pulmonary hypertension that presents for postop check after left TCAR on 07/02/2022 for an asymptomatic high grade stenosis.  This was an awake TCAR given he could not get general anesthesia due to his severe pulmonary hypertension.  Postop course was complicated by hematoma of the left neck incision that was managed nonoperatively.  We held his Coumadin at discharge but instructed him to stay on aspirin Plavix for his stent.    Doing well today.  No neurologic events since his last visit.  Left neck hematoma has resolved.  His cardiologist Dr. Shirlee Latch has put him on Coumadin and Plavix and stopped his aspirin for now.  He wants to proceed with having his right side done given he has a contralateral high-grade carotid stenosis as well.           Past Medical History:  Diagnosis Date   (HFpEF) heart failure with preserved ejection fraction (HCC)     Arthritis     Atrial fibrillation (HCC)     Carotid artery disease (HCC)      Dysrhythmia     Hyperlipidemia     Hypertension     Pulmonary hypertension (HCC)     Type 2 diabetes mellitus (HCC)             Past Surgical History:  Procedure Laterality Date   Cataract surgery       COLONOSCOPY       HERNIA REPAIR       RIGHT HEART CATH N/A 05/28/2022    Procedure: RIGHT HEART CATH;  Surgeon: Laurey Morale, MD;  Location: [facility] INVASIVE CV LAB;  Service: Cardiovascular;  Laterality: N/A;   RIGHT/LEFT HEART CATH AND CORONARY ANGIOGRAPHY N/A 04/30/2022    Procedure: RIGHT/LEFT HEART CATH AND CORONARY ANGIOGRAPHY;  Surgeon: Yvonne Kendall, MD;  Location: [facility] INVASIVE CV LAB;  Service: Cardiovascular;  Laterality: N/A;   TRANSCAROTID ARTERY REVASCULARIZATION  Left 07/02/2022    Procedure: Transcarotid Artery Revascularization;  Surgeon: Cephus Shelling, MD;  Location: [facility] OR;  Service: Vascular;  Laterality: Left;   ULTRASOUND GUIDANCE FOR VASCULAR ACCESS Right 07/02/2022    Procedure: ULTRASOUND GUIDANCE FOR VASCULAR ACCESS;  Surgeon: Cephus Shelling, MD;  Location: [facility] OR;  Service: Vascular;  Laterality: Right;   YAG LASER APPLICATION Right 08/17/2016    Procedure: YAG LASER APPLICATION;  Surgeon: Susa Simmonds, MD;  Location: [facility] ORS;  Service: Ophthalmology;  Laterality: Right;  Family History  Problem Relation Age of Onset   Stroke Other     Heart disease Other        SOCIAL HISTORY: Social History         Tobacco Use   Smoking status: Never   Smokeless tobacco: Never   Tobacco comments:      Never smoke 05/18/22  Substance Use Topics   Alcohol use: No      Alcohol/week: 0.0 standard drinks of alcohol           Allergies  Allergen Reactions   Other Rash      Bandaids            Current Outpatient Medications  Medication Sig Dispense Refill   amLODipine (NORVASC) 5 MG tablet Take 1 tablet (5 mg total) by mouth daily. 30 tablet 11   atorvastatin (LIPITOR) 40 MG tablet Take 40 mg by mouth at bedtime.        cholecalciferol (VITAMIN D) 25 MCG (1000 UNIT) tablet Take 1,000 Units by mouth in the morning and at bedtime.       clopidogrel (PLAVIX) 75 MG tablet Take 1 tablet (75 mg total) by mouth daily. 30 tablet 6   dapagliflozin propanediol (FARXIGA) 10 MG TABS tablet Take 1 tablet (10 mg total) by mouth daily. 30 tablet 5   fish oil-omega-3 fatty acids 1000 MG capsule Take 1 g by mouth every evening.       macitentan (OPSUMIT) 10 MG tablet Take 10 mg by mouth daily.       Misc Natural Products (GLUCOS-CHONDROIT-MSM COMPLEX) TABS Take 2 tablets by mouth daily.       Multiple Vitamins-Minerals (MULTIVITAMIN WITH MINERALS) tablet Take 1 tablet by mouth in the morning.  Centrum Silver       niacin 500 MG tablet Take 500 mg by mouth in the morning.       sacubitril-valsartan (ENTRESTO) 24-26 MG Take 1 tablet by mouth 2 (two) times daily. 60 tablet 11   spironolactone (ALDACTONE) 25 MG tablet Take 0.5 tablets (12.5 mg total) by mouth daily. 45 tablet 3   tadalafil, PAH, (ADCIRCA) 20 MG tablet Take 2 tablets (40 mg total) by mouth daily. 60 tablet 5   torsemide (DEMADEX) 20 MG tablet Take 2 tablets (40 mg total) by mouth 2 (two) times daily. 180 tablet 3   warfarin (COUMADIN) 5 MG tablet Take 5 mg by mouth daily. 1 tablet on Mon, Tues, Wed, Fri, Sat  1.5 on Thursday and Sunday       oxyCODONE (OXY IR/ROXICODONE) 5 MG immediate release tablet Take 1 tablet (5 mg total) by mouth every 6 (six) hours as needed for moderate pain. (Patient not taking: Reported on 07/10/2022) 12 tablet 0    No current facility-administered medications for this visit.      REVIEW OF SYSTEMS:  [X]  denotes positive finding, [ ]  denotes negative finding Cardiac   Comments:  Chest pain or chest pressure:      Shortness of breath upon exertion:      Short of breath when lying flat:      Irregular heart rhythm:             Vascular      Pain in calf, thigh, or hip brought on by ambulation:      Pain in feet at night that wakes you  up from your sleep:       Blood clot in your veins:      Leg  swelling:              Pulmonary      Oxygen at home:      Productive cough:       Wheezing:              Neurologic      Sudden weakness in arms or legs:       Sudden numbness in arms or legs:       Sudden onset of difficulty speaking or slurred speech:      Temporary loss of vision in one eye:       Problems with dizziness:              Gastrointestinal      Blood in stool:       Vomited blood:              Genitourinary      Burning when urinating:       Blood in urine:             Psychiatric      Major depression:              Hematologic      Bleeding problems:      Problems with blood clotting too easily:             Skin      Rashes or ulcers:             Constitutional      Fever or chills:          PHYSICAL EXAM:     Vitals:    07/31/22 1045 07/31/22 1048  BP: (!) 166/67 (!) 173/65  Pulse: 66 66  Resp: 18    Temp: 98 F (36.7 C)    TempSrc: Temporal    SpO2: 96%    Weight: 191 lb (86.6 kg)    Height: 5\' 7"  (1.702 m)        GENERAL: The patient is a well-nourished male, in no acute distress. The vital signs are documented above. Left neck hematoma resolved PULMONARY: No respiratory distress. ABDOMEN: Soft and non-tender. MUSCULOSKELETAL: There are no major deformities or cyanosis. NEUROLOGIC: No focal weakness or paresthesias are detected.  Cranial nerves II through XII grossly intact PSYCHIATRIC: The patient has a normal affect.   DATA:    CLINICAL DATA:  Shortness of breath, lower extremity edema bilateral carotid stenosis   EXAM: CT ANGIOGRAPHY NECK   TECHNIQUE: Multidetector CT imaging of the neck was performed using the standard protocol during bolus administration of intravenous contrast. Multiplanar CT image reconstructions and MIPs were obtained to evaluate the vascular anatomy. Carotid stenosis measurements (when applicable) are obtained utilizing NASCET criteria,  using the distal internal carotid diameter as the denominator.   RADIATION DOSE REDUCTION: This exam was performed according to the departmental dose-optimization program which includes automated exposure control, adjustment of the mA and/or kV according to patient size and/or use of iterative reconstruction technique.   CONTRAST:  77mL OMNIPAQUE IOHEXOL 350 MG/ML SOLN   COMPARISON:  None Available.   FINDINGS: Aortic arch: There is mild calcified atherosclerotic plaque in the imaged aortic arch. The origins of the major branch vessels are patent. The subclavian arteries are patent to the level imaged.   Right carotid system: The right common carotid artery is widely patent. There is bulky mixed plaque in the proximal right internal carotid artery resulting in critical stenosis. The percentage of stenosis is difficult  to calculated but is greater than 80%. The distal right internal carotid artery is widely patent. The right external carotid artery is patent with moderate stenosis at the origin.   There is no evidence of dissection or aneurysm.   Left carotid system: The left common carotid artery is patent with calcified plaque distally but no hemodynamically significant stenosis. There is bulky mixed plaque at the bifurcation resulting in high-grade stenosis. The percentage of stenosis is difficult to calculate common but is estimated at greater than 80%. The distal left internal carotid artery is patent. There is no evidence of dissection or aneurysm.   Vertebral arteries: The vertebral arteries are patent, without hemodynamically significant stenosis or occlusion. There is no dissection or aneurysm. There is mild calcified plaque in the right V4 segment without high-grade stenosis.   There is calcified plaque in the intracranial ICAs without high-grade stenosis.   Skeleton: There is multilevel degenerative change of the cervical spine. There is no acute osseous  abnormality or suspicious osseous lesion.   Other neck: The soft tissues of the neck are unremarkable. There is mucosal thickening in the maxillary sinuses with evidence of chronic sinusitis.   Upper chest: There are bilateral pleural effusions, right larger than left, incompletely imaged. Mosaic attenuation in the lung apices suggests air trapping/small airway disease. The main pulmonary artery appears enlarged suggesting pulmonary hypertension. There are prominent upper mediastinal lymph nodes without evidence of pathologic enlargement, nonspecific.   IMPRESSION: 1. High-grade/Critical stenosis of the bilateral proximal internal carotid arteries, right worse than left. 2. Patent vertebral arteries. 3. Right larger than left pleural effusions, incompletely imaged. Enlargement of the main pulmonary artery suggests pulmonary hypertension. 4. Mosaic attenuation in the lung apices suggests air trapping/small airway disease.   Assessment/Plan:    80 y.o. male with history of atrial fibrillation on Coumadin as well as severe pulmonary hypertension that presents for postop check after left TCAR on 07/02/2022 for an asymptomatic high grade stenosis.  This was an awake TCAR given he could not get general anesthesia due to his severe pulmonary hypertension.  Postop course was complicated by hematoma at the neck incision that was managed nonoperatively.  We did hold his Coumadin at discharge but instructed to stay on aspirin Plavix for his stent.   His left neck hematoma is resolved.  His carotid duplex today looks good and he does not have any evidence of a residual critical stenosis on the left after recent intervention.  He does have a contralateral high-grade stenosis as previously documented by Dr. Arbie Cookey and further CTA neck work-up.  I discussed the option of medical management versus surgical intervention on the right ICA.  He wants to proceed with right TCAR and is very fearful of his stroke  risk.  We will schedule for 08/20/2022 with me again in the OR with no general anesthesia given his comorbidities.  He will see Dr. Shirlee Latch the week prior.  Discussed when he stops his Coumadin for right TCAR he can start an aspirin again with his Plavix.  Discussed risk and benefits again including 1% perioperative stroke risk.  Discussed this is for long-term stroke risk reduction.     Cephus Shelling, MD [facility] Office: [phone]

## 2022-08-20 NOTE — Discharge Instructions (Signed)
   [facility]  Discharge Instructions   Carotid Surgery  Please refer to the following instructions for your post-procedure care. Your surgeon or physician assistant will discuss any changes with you.  Activity  You are encouraged to walk as much as you can. You can slowly return to normal activities but must avoid strenuous activity and heavy lifting until your doctor tell you it's okay. Avoid activities such as vacuuming or swinging a golf club. You can drive after one week if you are comfortable and you are no longer taking prescription pain medications. It is normal to feel tired for serval weeks after your surgery. It is also normal to have difficulty with sleep habits, eating, and bowel movements after surgery. These will go away with time.  Bathing/Showering  Shower daily after you go home. Do not soak in a bathtub, hot tub, or swim until the incision heals completely.  Incision Care  Shower every day. Clean your incision with mild soap and water. Pat the area dry with a clean towel. You do not need a bandage unless otherwise instructed. Do not apply any ointments or creams to your incision. You may have skin glue on your incision. Do not peel it off. It will come off on its own in about one week. Your incision may feel thickened and raised for several weeks after your surgery. This is normal and the skin will soften over time.   For Men Only: It's okay to shave around the incision but do not shave the incision itself for 2 weeks. It is common to have numbness under your chin that could last for several months.  Diet  Resume your normal diet. There are no special food restrictions following this procedure. A low fat/low cholesterol diet is recommended for all patients with vascular disease. In order to heal from your surgery, it is CRITICAL to get adequate nutrition. Your body requires vitamins, minerals, and protein. Vegetables are the best source of  vitamins and minerals. Vegetables also provide the perfect balance of protein. Processed food has little nutritional value, so try to avoid this.  Medications  Resume taking all of your medications unless your doctor or physician assistant tells you not to. If your incision is causing pain, you may take over-the- counter pain relievers such as acetaminophen (Tylenol). If you were prescribed a stronger pain medication, please be aware these medications can cause nausea and constipation. Prevent nausea by taking the medication with a snack or meal. Avoid constipation by drinking plenty of fluids and eating foods with a high amount of fiber, such as fruits, vegetables, and grains.  Do not take Tylenol if you are taking prescription pain medications.  Restart coumadin on 08/22/2022.  Follow Up  Our office will schedule a follow up appointment 2-3 weeks following discharge.  Please call us immediately for any of the following conditions  Increased pain, redness, drainage (pus) from your incision site. Fever of 101 degrees or higher. If you should develop stroke (slurred speech, difficulty swallowing, weakness on one side of your body, loss of vision) you should call 911 and go to the nearest emergency room.  Reduce your risk of vascular disease:  Stop smoking. If you would like help call [facility] at 1-800-QUIT-NOW (910-401-5954) or [place] at [phone]. Manage your cholesterol Maintain a desired weight Control your diabetes Keep your blood pressure down  If you have any questions, please call the office at [phone].

## 2022-08-20 NOTE — Anesthesia Postprocedure Evaluation (Signed)
Anesthesia Post Note  Patient: Robert Baldwin  Procedure(s) Performed: Right Transcarotid Artery Revascularization (Right: Groin) ULTRASOUND GUIDANCE FOR VASCULAR ACCESS (Left: Groin)     Patient location during evaluation: PACU Anesthesia Type: MAC Level of consciousness: awake and alert Pain management: pain level controlled Vital Signs Assessment: post-procedure vital signs reviewed and stable Respiratory status: spontaneous breathing, nonlabored ventilation, respiratory function stable and patient connected to nasal cannula oxygen Cardiovascular status: stable and blood pressure returned to baseline Postop Assessment: no apparent nausea or vomiting Anesthetic complications: no   No notable events documented.  Last Vitals:  Vitals:   08/20/22 1345 08/20/22 1415  BP: (!) 146/46 (!) 140/50  Pulse: 60 62  Resp: 16 16  Temp:  36.7 C  SpO2: 94% 96%    Last Pain:  Vitals:   08/20/22 1415  TempSrc: Oral  PainSc:                  [name]

## 2022-08-21 ENCOUNTER — Telehealth: Payer: Self-pay

## 2022-08-21 ENCOUNTER — Encounter (HOSPITAL_COMMUNITY): Payer: Self-pay | Admitting: Vascular Surgery

## 2022-08-21 LAB — CBC
HCT: 31.6 % — ABNORMAL LOW (ref 39.0–52.0)
Hemoglobin: 9.9 g/dL — ABNORMAL LOW (ref 13.0–17.0)
MCH: 30.7 pg (ref 26.0–34.0)
MCHC: 31.3 g/dL (ref 30.0–36.0)
MCV: 97.8 fL (ref 80.0–100.0)
Platelets: 162 10*3/uL (ref 150–400)
RBC: 3.23 MIL/uL — ABNORMAL LOW (ref 4.22–5.81)
RDW: 14.9 % (ref 11.5–15.5)
WBC: 9.4 10*3/uL (ref 4.0–10.5)
nRBC: 0 % (ref 0.0–0.2)

## 2022-08-21 LAB — BASIC METABOLIC PANEL
Anion gap: 12 (ref 5–15)
BUN: 60 mg/dL — ABNORMAL HIGH (ref 8–23)
CO2: 24 mmol/L (ref 22–32)
Calcium: 9.2 mg/dL (ref 8.9–10.3)
Chloride: 107 mmol/L (ref 98–111)
Creatinine, Ser: 1.71 mg/dL — ABNORMAL HIGH (ref 0.61–1.24)
GFR, Estimated: 40 mL/min — ABNORMAL LOW (ref >60)
Glucose, Bld: 141 mg/dL — ABNORMAL HIGH (ref 70–99)
Potassium: 4 mmol/L (ref 3.5–5.1)
Sodium: 143 mmol/L (ref 135–145)

## 2022-08-21 LAB — GLUCOSE, CAPILLARY
Glucose-Capillary: 134 mg/dL — ABNORMAL HIGH (ref 70–99)
Glucose-Capillary: 139 mg/dL — ABNORMAL HIGH (ref 70–99)

## 2022-08-21 MED ORDER — OXYCODONE HCL 5 MG PO TABS: 5.0000 mg | ORAL_TABLET | Freq: Four times a day (QID) | ORAL | 0 refills | Status: DC | PRN

## 2022-08-21 MED ORDER — ASPIRIN 81 MG PO TBEC: 81.0000 mg | DELAYED_RELEASE_TABLET | Freq: Every day | ORAL | Status: DC

## 2022-08-21 NOTE — Discharge Summary (Signed)
Discharge Summary     Robert Baldwin November 06, 1940 81 y.o. male  [id number]  Admission Date: 08/20/2022  Discharge Date: 08/21/2022  Physician: Cephus Shelling, MD  Admission Diagnosis: Carotid artery stenosis, asymptomatic, right [I65.21]   HPI:   This is a 81 y.o. male with history of atrial fibrillation on Coumadin as well as severe pulmonary hypertension that presents for postop check after left TCAR on 07/02/2022 for an asymptomatic high grade stenosis.  This was an awake TCAR given he could not get general anesthesia due to his severe pulmonary hypertension.  Postop course was complicated by hematoma of the left neck incision that was managed nonoperatively.  We held his Coumadin at discharge but instructed him to stay on aspirin Plavix for his stent.    Doing well today.  No neurologic events since his last visit.  Left neck hematoma has resolved.  His cardiologist Dr. Shirlee Latch has put him on Coumadin and Plavix and stopped his aspirin for now.  He wants to proceed with having his right side done given he has a contralateral high-grade carotid stenosis as well.  Hospital Course:  The patient was admitted to the hospital and taken to the operating room on 08/20/2022 and underwent right TCAR    Findings: No general anesthesia given high cardiac risk with severe pulmonary hypertension.  Left common femoral vein was accessed under ultrasound guidance for placement of the venous TCAR sheath.  Transverse incision above the right clavicle where we dissected out the common carotid artery and ultimately after a pursestring was placed we placed the arterial working sheath.  After we went on active flow reversal, the lesion was crossed in the proximal right ICA where there was greater than 80% stenosis and this was angioplastied with a 6 mm x 30 mm angioplasty balloon and stented with a 9 mm x 40 mm Enroute stent and then postdilated with a 7 mm x 15 mm angioplasty balloon.  Less than 30%  residual stenosis at completion.  The pt tolerated the procedure well and was transported to the PACU in good condition.   By POD 1, the pt neuro status was in tact and he was doing well.  He had voided and ambulated.  Pt will restart his coumadin on 08/22/2022.  He will be on triple therapy for one month.  When we see him back in the office, we will discontinue the Plavix at that time.    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]  Secondary Diagnosis: Patient Active Problem List   Diagnosis Date Noted   Carotid artery stenosis, asymptomatic, right 08/20/2022   Carotid stenosis 06/05/2022   Severe pulmonary hypertension (HCC) 04/30/2022   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) 04/30/2022   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   Dyspnea on exertion 01/30/2011   DIABETES MELLITUS, TYPE II 04/06/2010   Mixed hyperlipidemia 04/06/2010  Essential hypertension, benign 04/06/2010   Atrial fibrillation with slow ventricular response (HCC) 07/30/2009   Past Medical History:  Diagnosis Date   (HFpEF) heart failure with preserved ejection fraction (HCC)    Arthritis    Atrial fibrillation (HCC)    Bradycardia    Carotid artery disease (HCC)    Dysrhythmia    Hyperlipidemia    Hypertension    Pulmonary hypertension (HCC)    Type 2 diabetes mellitus (HCC)     Allergies as of 08/21/2022       Reactions   Other Rash   Bandaids        Medication List     TAKE these medications    amLODipine 5  MG tablet Commonly known as: NORVASC Take 1 tablet (5 mg total) by mouth daily.   aspirin EC 81 MG tablet Take 1 tablet (81 mg total) by mouth daily. Swallow whole.   atorvastatin 40 MG tablet Commonly known as: LIPITOR Take 40 mg by mouth at bedtime.   cholecalciferol 25 MCG (1000 UNIT) tablet Commonly known as: VITAMIN D3 Take 1,000 Units by mouth in the morning and at bedtime.   clopidogrel 75 MG tablet Commonly known as: PLAVIX Take 1 tablet (75 mg total) by mouth daily. What changed: when to take this   dapagliflozin propanediol 10 MG Tabs tablet Commonly known as: FARXIGA Take 1 tablet (10 mg total) by mouth daily.   Entresto 24-26 MG Generic drug: sacubitril-valsartan Take 1 tablet by mouth 2 (two) times daily.   fish oil-omega-3 fatty acids 1000 MG capsule Take 1 g by mouth every evening.   Glucos-Chondroit-MSM Complex Tabs Take 2 tablets by mouth daily.   multivitamin with minerals tablet Take 1 tablet by mouth in the morning.  Centrum Silver   niacin 500 MG tablet Commonly known as: VITAMIN B3 Take 500 mg by mouth in the morning.   Opsumit 10 MG tablet Generic drug: macitentan Take 10 mg by mouth daily.   oxyCODONE 5 MG immediate release tablet Commonly known as: Oxy IR/ROXICODONE Take 1 tablet (5 mg total) by mouth every 6 (six) hours as needed for moderate pain.   spironolactone 25 MG tablet Commonly known as: ALDACTONE Take 0.5 tablets (12.5 mg total) by mouth daily.   tadalafil (PAH) 20 MG tablet Commonly known as: ADCIRCA Take 2 tablets (40 mg total) by mouth daily.   torsemide 20 MG tablet Commonly known as: DEMADEX Take 2 tablets (40 mg total) by mouth 2 (two) times daily.   warfarin 5 MG tablet Commonly known as: COUMADIN Take 5-7.5 mg by mouth See admin instructions. Take 5 mg on Mon, Tues, Wed, Fri, and Sat Take 7.5 mg on Thurs and Sun         [facility] Discharge Instructions Carotid  Endarterectomy (CEA)  Please refer to the following instructions for your post-procedure care. Your surgeon or physician assistant will discuss any changes with you.  Activity  You are encouraged to walk as much as you can. You can slowly return to normal activities but must avoid strenuous activity and heavy lifting until your doctor tell you it's OK. Avoid activities such as vacuuming or swinging a golf club. You can drive after one week if you are comfortable and you are no longer taking prescription pain medications. It is normal to feel tired for serval weeks after your surgery. It is also normal to have difficulty with sleep habits, eating, and bowel movements after surgery. These will  go away with time.  Bathing/Showering  You may shower after you come home. Do not soak in a bathtub, hot tub, or swim until the incision heals completely.  Incision Care  Shower every day. Clean your incision with mild soap and water. Pat the area dry with a clean towel. You do not need a bandage unless otherwise instructed. Do not apply any ointments or creams to your incision. You may have skin glue on your incision. Do not peel it off. It will come off on its own in about one week. Your incision may feel thickened and raised for several weeks after your surgery. This is normal and the skin will soften over time. For Men Only: It's OK to shave around the incision but do not shave the incision itself for 2 weeks. It is common to have numbness under your chin that could last for several months.  Diet  Resume your normal diet. There are no special food restrictions following this procedure. A low fat/low cholesterol diet is recommended for all patients with vascular disease. In order to heal from your surgery, it is CRITICAL to get adequate nutrition. Your body requires vitamins, minerals, and protein. Vegetables are the best source of vitamins and minerals. Vegetables also provide the perfect balance of  protein. Processed food has little nutritional value, so try to avoid this.  Medications  Resume taking all of your medications unless your doctor or physician assistant tells you not to.  If your incision is causing pain, you may take over-the- counter pain relievers such as acetaminophen (Tylenol). If you were prescribed a stronger pain medication, please be aware these medications can cause nausea and constipation.  Prevent nausea by taking the medication with a snack or meal. Avoid constipation by drinking plenty of fluids and eating foods with a high amount of fiber, such as fruits, vegetables, and grains.  Do not take Tylenol if you are taking prescription pain medications.  Restart coumadin on 08/22/2022.  Follow Up  Our office will schedule a follow up appointment 2-3 weeks following discharge.  Please call us immediately for any of the following conditions  Increased pain, redness, drainage (pus) from your incision site. Fever of 101 degrees or higher. If you should develop stroke (slurred speech, difficulty swallowing, weakness on one side of your body, loss of vision) you should call 911 and go to the nearest emergency room.  Reduce your risk of vascular disease:  Stop smoking. If you would like help call [facility] at 1-800-QUIT-NOW (831 701 8964) or [place] at (939)220-4947. Manage your cholesterol Maintain a desired weight Control your diabetes Keep your blood pressure down  If you have any questions, please call the office at [phone].  Prescriptions given: 1.   Oxycodone#8 No Refill 2.    Asa 81mg  daily *pt will be on asa/plavix/coumadin x one month.  When he returns to office, the plavix will be discontinued and he will be maintained on asa and coumadin.  Disposition: home  Patient's condition: is Good  Follow up: 1. [facility] in 4 weeks with carotid duplex   , PA-C [facility] [phone]   --- For [facility] Registry use  ---   Modified Rankin score at D/C (0-6): 0  IV medication needed for:  1. Hypertension: No 2. Hypotension: No  Post-op Complications: No  1. Post-op CVA or TIA: No  If yes: Event classification (right eye, left eye, right cortical, left cortical, verterobasilar, other): n/a  If yes: Timing of event (intra-op, <  6 hrs post-op, >=6 hrs post-op, unknown): n/a  2. CN injury: No  If yes: CN n/a injuried   3. Myocardial infarction: No  If yes: Dx by (EKG or clinical, Troponin): n/a  4.  CHF: No  5.  Dysrhythmia (new): No  6. Wound infection: No  7. Reperfusion symptoms: No  8. Return to OR: No  If yes: return to OR for (bleeding, neurologic, other CEA incision, other): n/a  Discharge medications: Statin use:  Yes ASA use:  Yes   Beta blocker use:  No ACE-Inhibitor use:  No  ARB use:  Yes CCB use: Yes P2Y12 Antagonist use: Yes, [ x] Plavix, [ ]  Plasugrel, [ ]  Ticlopinine, [ ]  Ticagrelor, [ ]  Other, [ ]  No for medical reason, [ ]  Non-compliant, [ ]  Not-indicated Anti-coagulant use:  Yes, [ x] Warfarin, [ ]  Rivaroxaban, [ ]  Dabigatran,

## 2022-08-21 NOTE — Progress Notes (Addendum)
Progress Note    08/21/2022 6:53 AM 1 Day Post-Op  Subjective:  wants to know when he can go home.  Says he has a little soreness in the left groin.  Says he has walked and voided.  Says his throat is a little sore but doesn't feel his food is going down the wrong pipe...just sore  Afebrile HR 40's-50's [id number] systolic 16% RA  Gtts:  none  Vitals:   08/20/22 2009 08/21/22 0340  BP: (!) 131/51 (!) 142/45  Pulse: 60 (!) 50  Resp: 15 17  Temp: 97.8 F (36.6 C) 97.7 F (36.5 C)  SpO2: 96% 94%    Physical Exam: General:  no distress Lungs:  non labored Incisions:  right neck incision is clean and dry with minimal ecchymosis; no hematoma; left groin soft at stick site; mild hematoma proximal to stick site that is non tender Extremities:  moving all extremities equally   CBC    Component Value Date/Time   WBC 9.4 08/21/2022 0415   RBC 3.23 (L) 08/21/2022 0415   HGB 9.9 (L) 08/21/2022 0415   HCT 31.6 (L) 08/21/2022 0415   PLT 162 08/21/2022 0415   MCV 97.8 08/21/2022 0415   MCH 30.7 08/21/2022 0415   MCHC 31.3 08/21/2022 0415   RDW 14.9 08/21/2022 0415   LYMPHSABS 0.9 04/30/2022 1130   MONOABS 1.0 04/30/2022 1130   EOSABS 0.1 04/30/2022 1130   BASOSABS 0.0 04/30/2022 1130    BMET    Component Value Date/Time   NA 143 08/21/2022 0415   K 4.0 08/21/2022 0415   CL 107 08/21/2022 0415   CO2 24 08/21/2022 0415   GLUCOSE 141 (H) 08/21/2022 0415   BUN 60 (H) 08/21/2022 0415   CREATININE 1.71 (H) 08/21/2022 0415   CALCIUM 9.2 08/21/2022 0415   GFRNONAA 40 (L) 08/21/2022 0415    INR    Component Value Date/Time   INR 1.2 08/20/2022 0730     Intake/Output Summary (Last 24 hours) at 08/21/2022 0653 Last data filed at 08/21/2022 0552 Gross per 24 hour  Intake 1450 ml  Output 2500 ml  Net -1050 ml     Assessment/Plan:  81 y.o. male is s/p:  Right TCAR  1 Day Post-Op   -pt doing well this am and neuro in tact.  He has ambulated and voided.   Incision looks fine and neuro in tact -acute blood loss anemia stable and pt tolerating -creatinine stable at 1.71 -pt on coumadin prior to admission-will restart this today.  He states he has an appt next week to get his coumadin level checked with Dr. Brigitte Pulse.  Continue plavix.  -f/u in our office in 4 weeks with carotid duplex.   -pt knows to go to ER if he develops any stroke sx and these were reviewed.     Leontine Locket, PA-C [facility] [phone] 08/21/2022 6:53 AM  I have seen and evaluated the patient. I agree with the PA note as documented above.  Postop day 1 status post right TCAR for an asymptomatic high-grade stenosis.  Looks good this morning and neuro intact.  Neck incision looks good.  Plan discharge today.  Discussed aspirin statin Plavix for carotid stent.  He can resume his Coumadin tomorrow.  We will likely stop his Plavix at the 1 month follow-up when his carotid duplex is performed in the office.  Discussed he call with questions or concerns.  Marty Heck, MD [facility] [place] Office: [phone]

## 2022-08-21 NOTE — Telephone Encounter (Signed)
Pt's wife, Lynelle Smoke, called with questions regarding pt's Coumadin.  Reviewed pt's chart, returned call for clarification, two identifiers used. Informed her that per the d/c summary he is to:  Restart coumadin on 08/22/2022  AND  *pt will be on asa/plavix/coumadin x one month.  When he returns to office, the plavix will be discontinued and he will be maintained on asa and coumadin  Confirmed understanding.

## 2022-08-21 NOTE — Progress Notes (Signed)
Explained discharge instructions to patient. Reviewed follow up appointment and next medication administration times. Also reviewed post procedural education. Patient verbalized having an understanding for instructions given. All belongings are in the patient's possession to include his clothing, cell phone and charger. IV and telemetry were removed. CCMD was notified. No other needs verbalized. Transported downstairs to the discharge lounge.

## 2022-08-22 LAB — POCT ACTIVATED CLOTTING TIME: Activated Clotting Time: 269 seconds

## 2022-08-29 DIAGNOSIS — Z299 Encounter for prophylactic measures, unspecified: Secondary | ICD-10-CM | POA: Diagnosis not present

## 2022-08-29 DIAGNOSIS — I4891 Unspecified atrial fibrillation: Secondary | ICD-10-CM | POA: Diagnosis not present

## 2022-08-29 DIAGNOSIS — Z713 Dietary counseling and surveillance: Secondary | ICD-10-CM | POA: Diagnosis not present

## 2022-08-29 DIAGNOSIS — Z6832 Body mass index (BMI) 32.0-32.9, adult: Secondary | ICD-10-CM | POA: Diagnosis not present

## 2022-08-29 DIAGNOSIS — I10 Essential (primary) hypertension: Secondary | ICD-10-CM | POA: Diagnosis not present

## 2022-09-03 ENCOUNTER — Other Ambulatory Visit: Payer: Self-pay | Admitting: *Deleted

## 2022-09-03 DIAGNOSIS — I6523 Occlusion and stenosis of bilateral carotid arteries: Secondary | ICD-10-CM

## 2022-09-10 ENCOUNTER — Telehealth (HOSPITAL_COMMUNITY): Payer: Self-pay | Admitting: Cardiology

## 2022-09-10 ENCOUNTER — Other Ambulatory Visit (HOSPITAL_COMMUNITY): Payer: Self-pay | Admitting: Cardiology

## 2022-09-10 ENCOUNTER — Encounter (HOSPITAL_COMMUNITY): Payer: Self-pay

## 2022-09-10 ENCOUNTER — Ambulatory Visit (HOSPITAL_COMMUNITY)
Admission: RE | Admit: 2022-09-10 | Discharge: 2022-09-10 | Disposition: A | Discharge status: Home / Self Care | Payer: Medicare PPO | Source: Ambulatory Visit | Attending: Family Medicine | Admitting: Family Medicine

## 2022-09-10 VITALS — BP 136/50 | HR 60 | Wt 190.2 lb

## 2022-09-10 DIAGNOSIS — I4510 Unspecified right bundle-branch block: Secondary | ICD-10-CM | POA: Insufficient documentation

## 2022-09-10 DIAGNOSIS — Z7901 Long term (current) use of anticoagulants: Secondary | ICD-10-CM | POA: Insufficient documentation

## 2022-09-10 DIAGNOSIS — Z79899 Other long term (current) drug therapy: Secondary | ICD-10-CM | POA: Diagnosis not present

## 2022-09-10 DIAGNOSIS — I5032 Chronic diastolic (congestive) heart failure: Secondary | ICD-10-CM

## 2022-09-10 DIAGNOSIS — I272 Pulmonary hypertension, unspecified: Secondary | ICD-10-CM | POA: Diagnosis not present

## 2022-09-10 DIAGNOSIS — Z7902 Long term (current) use of antithrombotics/antiplatelets: Secondary | ICD-10-CM | POA: Insufficient documentation

## 2022-09-10 DIAGNOSIS — I6523 Occlusion and stenosis of bilateral carotid arteries: Secondary | ICD-10-CM | POA: Diagnosis not present

## 2022-09-10 DIAGNOSIS — E1159 Type 2 diabetes mellitus with other circulatory complications: Secondary | ICD-10-CM | POA: Diagnosis not present

## 2022-09-10 DIAGNOSIS — I2721 Secondary pulmonary arterial hypertension: Secondary | ICD-10-CM | POA: Insufficient documentation

## 2022-09-10 DIAGNOSIS — I50812 Chronic right heart failure: Secondary | ICD-10-CM | POA: Diagnosis not present

## 2022-09-10 DIAGNOSIS — Z01818 Encounter for other preprocedural examination: Secondary | ICD-10-CM | POA: Diagnosis not present

## 2022-09-10 DIAGNOSIS — I4821 Permanent atrial fibrillation: Secondary | ICD-10-CM

## 2022-09-10 DIAGNOSIS — E875 Hyperkalemia: Secondary | ICD-10-CM | POA: Insufficient documentation

## 2022-09-10 DIAGNOSIS — E1122 Type 2 diabetes mellitus with diabetic chronic kidney disease: Secondary | ICD-10-CM | POA: Diagnosis not present

## 2022-09-10 DIAGNOSIS — I130 Hypertensive heart and chronic kidney disease with heart failure and stage 1 through stage 4 chronic kidney disease, or unspecified chronic kidney disease: Secondary | ICD-10-CM | POA: Diagnosis not present

## 2022-09-10 LAB — BASIC METABOLIC PANEL
Anion gap: 13 (ref 5–15)
BUN: 68 mg/dL — ABNORMAL HIGH (ref 8–23)
CO2: 27 mmol/L (ref 22–32)
Calcium: 9.3 mg/dL (ref 8.9–10.3)
Chloride: 100 mmol/L (ref 98–111)
Creatinine, Ser: 2.35 mg/dL — ABNORMAL HIGH (ref 0.61–1.24)
GFR, Estimated: 27 mL/min — ABNORMAL LOW (ref >60)
Glucose, Bld: 141 mg/dL — ABNORMAL HIGH (ref 70–99)
Potassium: 5 mmol/L (ref 3.5–5.1)
Sodium: 140 mmol/L (ref 135–145)

## 2022-09-10 MED ORDER — TORSEMIDE 20 MG PO TABS: ORAL_TABLET | ORAL | 3 refills | Status: DC

## 2022-09-10 NOTE — Patient Instructions (Addendum)
Thank you for coming in today  Labs were done today, if any labs are abnormal the clinic will call you No news is good news  EKG today  Your physician recommends that you schedule a follow-up appointment in:  3-4 months with Dr, Earlean Shawl will receive a reminder letter in the mail a few months in advance. If you don't receive a letter, please call our office to schedule the follow-up appointment.   You have been given a prescription for compression hose and information on were to purchase them.  Please wear your compression hose daily, place them on as soon as you get up in the morning and remove before you go to bed at night.    Do the following things EVERYDAY: Weigh yourself in the morning before breakfast. Write it down and keep it in a log. Take your medicines as prescribed Eat low salt foods--Limit salt (sodium) to 2000 mg per day.  Stay as active as you can everyday Limit all fluids for the day to less than 2 liters  At the [facility], you and your health needs are our priority. As part of our continuing mission to provide you with exceptional heart care, we have created designated Provider Care Teams. These Care Teams include your primary Cardiologist (physician) and Advanced Practice Providers (APPs- Physician Assistants and Nurse Practitioners) who all work together to provide you with the care you need, when you need it.   You may see any of the following providers on your designated Care Team at your next follow up: Dr Arvilla Meres Dr Marca Ancona Dr. Marcos Eke, NP Robbie Lis, [place] [facility] [place], [place] Brynda Peon, NP Karle Plumber, PharmD   Please be sure to bring in all your medications bottles to every appointment.    If you have any questions or concerns before your next appointment please send [place] a message through [place] or call our office at [phone].    TO LEAVE A MESSAGE FOR THE NURSE SELECT  OPTION 2, PLEASE LEAVE A MESSAGE INCLUDING: YOUR NAME DATE OF BIRTH CALL BACK NUMBER REASON FOR CALL**this is important as we prioritize the call backs  YOU WILL RECEIVE A CALL BACK THE SAME DAY AS LONG AS YOU CALL BEFORE 4:00 PM

## 2022-09-10 NOTE — Telephone Encounter (Signed)
Patient called.  Patient aware. REPEAT LABS WITH [facility]

## 2022-09-10 NOTE — Telephone Encounter (Signed)
-----   Message from Jacklynn Ganong, [place] sent at 09/10/2022  1:41 PM EST ----- Kidney function elevated.   Decrease torsemide to 40 mg q am and 20 mg q pm.  Repeat BMET in 7-10 days.

## 2022-09-10 NOTE — Progress Notes (Signed)
Advanced Heart Failure Clinic Note   PCP: Kirstie Peri, MD Primary Cardiologist: Dr. Diona Browner HF Cardiologist: Dr. Shirlee Latch  HPI: Robert Baldwin is a 81 y.o. male with history of b/l carotid artery stenosis, DM II, HTN, HLD, permanent atrial fibrillation. He follows with Dr. Diona Browner in the cardiology clinic.    Saw [facility] for carotid artery stenosis in 6/23. Was noting more dyspnea with exertion and lower extremity edema for about a month. He was subsequently started on po lasix 20 mg daily.   He was seen by Dr. Diona Browner in the clinic 04/26/22 for evaluation of recent symptoms and pre-operative evaluation in preparation for left CEA followed by staged right CEA. Echo day of the visit showed EF 70-75%, hyperdynamic LV function, mild LVH, interventricular septum flattened in systole and diastole consistent with RV pressure and volume overload, RV moderately enlarged and moderately reduced, RVSP 120 mmHg, moderate LAE, severe RAE, moderate pericardial effusion, severe TR, dilated IVC. Patient was scheduled for outpatient R/LHC to better assess hemodynamics and further characterize pulmonary hypertension.    [facility] 7/23 showed no significant CAD, RA mean 25, PA 115/30 (58), LVEDP 22 mmHg, PCWP not obtained, Fick CO/CI 6.4/3.1, PAPi 3.4. PVR 5.6 WU. 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 He was diuresed with IV lasix. V/Q scan negative for chronic PE. ANA+. All other serologic test -. HRCT with no definite ILD. Started on tadalafil. Diuresed 24 lbs. Hospitalization complicated by right groin hematoma and received 1 unit PRBCs. Discharged home, weight 179 lbs.  Repeat RHC 8/23 showed elevated PCWP and severe mixed pulmonary arterial/venous hypertension, but PA pressures significantly lower than prior RHC. He was instructed to increase torsemide to 40 mg daily alternating with 20 mg every other day. [name] and [name] held with hyperkalemia.  Patient  had awake left TCAR by [name] in 9/23, no complications.   Follow up 10/23, volume overloaded, weight up 18 lbs. Torsemide increased to 40 bid and spiro 12.5 added back.   S/p right TCAR 10/23. Tolerated procedure well. Plan for triple therapy x 1 month, then will d/c Plavix.  Today he returns for post-TCAR follow up with his wife. Overall feeling fine. He is not short of breath walking on flat ground. Denies palpitations, abnormal bleeding, CP, dizziness, edema, or PND/Orthopnea. Appetite ok. No fever or chills. Weight at home 183-191 pounds. Taking all medications. He has sleep study at [facility] soon.  ECG (personally reviewed): AF 45 bpm, rBBB  Labs (6/23): K 3.8, creatinine 1.53 Labs (7/23): K 5.5, creatinine 1.73 Labs (8/23): K 4.6, creatinine 1.5 Labs (9/23): LDL 56, K 3.9, creatinine 2.68 Labs (10/23): K 4.4, creatinine 1.94  PMH: 1. HTN 2. Hyperlipidemia 3. Type 2 diabetes 4. CKD stage 3 5. Atrial fibrillation: Permanent.  6. Carotid stenosis: CTA neck (6/23) with high grade stenosis of the bilateral internal carotid arteries.  - S/p left TCAR 9/23.  - S/p right TCAR 10/23. 7. 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  - R/LHC (7/23): no significant CAD; RA mean 25, PA 115/30 (58), LVEDP 22 mmHg, PCWP not obtained, Fick CO/CI 6.4/3.1, PAPi 3.4. PVR 5.6 WU. - RHC (8/23): Mean RA 8, PA 85/40 mean 41, mean PCWP  22, CI 2.86, PVR 3.4 WU 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  See RHC and echo data above.   - V/Q scan (7/23): No evidence for chronic PE.  - High resolution CT chest (7/23): No definite ILD.  - +ANA  Current Outpatient Medications   Medication Sig Dispense Refill   amLODipine (NORVASC) 5 MG tablet Take 1 tablet (5 mg total) by mouth daily. 30 tablet 11   aspirin EC 81 MG tablet Take 1 tablet (81 mg total) by mouth daily. Swallow whole.     atorvastatin (LIPITOR) 40 MG tablet Take 40 mg by mouth at bedtime.     cholecalciferol (VITAMIN D) 25 MCG (1000 UNIT) tablet Take 1,000 Units by mouth in the morning and at bedtime.     clopidogrel (PLAVIX) 75 MG tablet Take 1 tablet (75 mg total) by mouth daily. 30 tablet 6   dapagliflozin propanediol (FARXIGA) 10 MG TABS tablet Take 1 tablet (10 mg total) by mouth daily. 30 tablet 5   fish oil-omega-3 fatty acids 1000 MG capsule Take 1 g by mouth every evening.     macitentan (OPSUMIT) 10 MG tablet Take 10 mg by mouth daily.     Misc Natural Products (GLUCOS-CHONDROIT-MSM COMPLEX) TABS Take 2 tablets by mouth daily.     Multiple Vitamins-Minerals (MULTIVITAMIN WITH MINERALS) tablet Take 1 tablet by mouth in the morning.  Centrum Silver     niacin 500 MG tablet Take 500 mg by mouth in the morning.     sacubitril-valsartan (ENTRESTO) 24-26 MG Take 1 tablet by mouth 2 (two) times daily. 60 tablet 11   spironolactone (ALDACTONE) 25 MG tablet Take 0.5 tablets (12.5 mg total) by mouth daily. 45 tablet 3   tadalafil, PAH, (ADCIRCA) 20 MG tablet Take 2 tablets (40 mg total) by mouth daily. 60 tablet 5   torsemide (DEMADEX) 20 MG tablet Take 2 tablets (40 mg total) by mouth 2 (two) times daily. 180 tablet 3   warfarin (COUMADIN) 5 MG tablet Take 5-7.5 mg by mouth See admin instructions. Take 5 mg on Mon, Tues, Wed, Fri, and Sat Take 7.5 mg on Thurs and Sun     No current facility-administered medications for this encounter.   Allergies  Allergen Reactions   Other Rash    Bandaids   Social History   Socioeconomic History   Marital status: Married    Spouse name: Tammy   Number of children: Not on file   Years of education: Not on file   Highest education level: Not on file   Occupational History   Occupation: Full time    Comment: Contractor  Tobacco Use   Smoking status: Never   Smokeless tobacco: Never   Tobacco comments:    Never smoke 05/18/22  Vaping Use   Vaping Use: Never used  Substance and Sexual Activity   Alcohol use: No    Alcohol/week: 0.0 standard drinks of alcohol   Drug use: No   Sexual activity: Not on file  Other Topics Concern   Not on file  Social History Narrative   Not on file   Social Determinants of Health   Financial Resource Strain: Not on file  Food Insecurity: Not on file  Transportation Needs: Not on file  Physical Activity: Not on file  Stress: Not on file  Social Connections: Not on file  Intimate Partner Violence: Not on file   Family History  Problem Relation Age of Onset   Stroke Other  Heart disease Other    BP (!) 136/50 (BP Location: Left Arm)   Pulse 60   Wt 86.3 kg (190 lb 3.2 oz)   SpO2 91%   BMI 29.79 kg/m   Wt Readings from Last 3 Encounters:  09/10/22 86.3 kg (190 lb 3.2 oz)  08/20/22 85.3 kg (188 lb)  08/13/22 86.1 kg (189 lb 12.8 oz)   PHYSICAL EXAM: General:  NAD. No resp difficulty HEENT: Normal Neck: Supple. No JVD, thick neck. Carotids 2+ bilat; no bruits. No lymphadenopathy or thryomegaly appreciated. Cor: PMI nondisplaced. Irregular rate & rhythm. No rubs, gallops, 2/6 SEM RUSB Lungs: Clear Abdomen: Soft, nontender, nondistended. No hepatosplenomegaly. No bruits or masses. Good bowel sounds. Extremities: No cyanosis, clubbing, rash, edema; compression hose on Neuro: Alert & oriented x 3, cranial nerves grossly intact. Moves all 4 extremities w/o difficulty. Affect pleasant. . ASSESSMENT & PLAN: 1. 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 RHC/LHC 7/23 showed preserved cardiac output with severe pulmonary hypertension. 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  PA pressure was still high, but is significantly lower than prior RHC. Today, NYHA class II, he is not volume overloaded on exam, weight stable. - Continue torsemide 40 mg bid. BMET today. - Continue spironolactone 12.5 mg daily.  - Continue Entresto 24/26 mg bid. - Continue Farxiga 10 mg daily. No GU symptoms. 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  Some improvement on 8/23 RHC still with severe mixed PAH/PVH with PVR down to 3.4 WU.  He has never smoked and has no known lung disease.  He has been on warfarin for atrial fibrillation long-term. No known rheumatologic illness.  Not a drinker though he has some signs of cirrhosis by prior liver imaging.  V/Q scan in 7/23 negative for chronic PEs and HRCT chest without definite ILD. Serologic workup showed anti-SCL 70 neg, anti-centromere Ab neg, RF neg, ANA with reflex positive. 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 He has been referred to rheumatology for further w/u and has an appt scheduled in 12/23.  - Diuretics as above. - Continue Opsumit 10 mg daily. - Continue tadalafil 40 mg daily. - In lab sleep study arranged at [facility].  - Would not add Uptravi at this time, need better control of volume.  3. Atrial fibrillation: Permanent.   - Continue warfarin.   - Bradycardic, does not need nodal blocker.  4. DM2: Continue SGLT2i 5. Carotid stenosis: CTA neck in 6/23 showed high grade bilateral ICA stenoses. He has had awake L TCAR 9/23 and R TCAR 10/23.  - Plan for triple therapy x 1 month, then d/c Plavix, per Vascular. - He is on warfarin long-term for AF.   - Continue statin, goal LDL < 70.   Follow up in 3-4 months with Dr. Shirlee Latch, as  scheduled.  [facility] FNP-BC 09/10/2022

## 2022-09-11 ENCOUNTER — Ambulatory Visit (HOSPITAL_COMMUNITY)
Admission: RE | Admit: 2022-09-11 | Discharge: 2022-09-11 | Disposition: A | Discharge status: Home / Self Care | Payer: Medicare PPO | Source: Ambulatory Visit | Attending: Vascular Surgery | Admitting: Vascular Surgery

## 2022-09-11 ENCOUNTER — Ambulatory Visit (INDEPENDENT_AMBULATORY_CARE_PROVIDER_SITE_OTHER): Payer: Medicare PPO | Admitting: Physician Assistant

## 2022-09-11 VITALS — BP 148/43 | HR 60 | Temp 98.6°F | Resp 20 | Ht 67.0 in | Wt 190.0 lb

## 2022-09-11 DIAGNOSIS — I6523 Occlusion and stenosis of bilateral carotid arteries: Secondary | ICD-10-CM

## 2022-09-11 DIAGNOSIS — M7989 Other specified soft tissue disorders: Secondary | ICD-10-CM

## 2022-09-11 NOTE — Progress Notes (Signed)
POST OPERATIVE OFFICE NOTE    CC:  F/u for surgery  HPI:  This is a 81 y.o. male who is s/p right TCAR on 08/20/2022 by [name] for asymptomatic carotid artery stenosis.  He had a left TCAR on 07/02/2022 for asymptomatic carotid artery stenosis also by [name].  The surgery on the left was complicated by post operative hematoma.    Pt returns today for follow up.  Pt states he is doing well. He has not had any neurological sx.  He states that with the 2nd surgery, he knew what was going on being awake.  He states he still has some bruising around his left groin.   Allergies  Allergen Reactions   Other Rash    Bandaids    Current Outpatient Medications  Medication Sig Dispense Refill   amLODipine (NORVASC) 5 MG tablet Take 1 tablet (5 mg total) by mouth daily. 30 tablet 11   aspirin EC 81 MG tablet Take 1 tablet (81 mg total) by mouth daily. Swallow whole.     atorvastatin (LIPITOR) 40 MG tablet Take 40 mg by mouth at bedtime.     cholecalciferol (VITAMIN D) 25 MCG (1000 UNIT) tablet Take 1,000 Units by mouth in the morning and at bedtime.     clopidogrel (PLAVIX) 75 MG tablet Take 1 tablet (75 mg total) by mouth daily. 30 tablet 6   dapagliflozin propanediol (FARXIGA) 10 MG TABS tablet Take 1 tablet (10 mg total) by mouth daily. 30 tablet 5   fish oil-omega-3 fatty acids 1000 MG capsule Take 1 g by mouth every evening.     macitentan (OPSUMIT) 10 MG tablet Take 10 mg by mouth daily.     Misc Natural Products (GLUCOS-CHONDROIT-MSM COMPLEX) TABS Take 2 tablets by mouth daily.     Multiple Vitamins-Minerals (MULTIVITAMIN WITH MINERALS) tablet Take 1 tablet by mouth in the morning.  Centrum Silver     niacin 500 MG tablet Take 500 mg by mouth in the morning.     sacubitril-valsartan (ENTRESTO) 24-26 MG Take 1 tablet by mouth 2 (two) times daily. 60 tablet 11   spironolactone (ALDACTONE) 25 MG tablet Take 0.5 tablets (12.5 mg total) by mouth daily. 45 tablet 3   tadalafil, PAH,  (ADCIRCA) 20 MG tablet Take 2 tablets (40 mg total) by mouth daily. 60 tablet 5   torsemide (DEMADEX) 20 MG tablet Take 2 tablets (40 mg total) by mouth daily AND 1 tablet (20 mg total) every evening. 90 tablet 3   warfarin (COUMADIN) 5 MG tablet Take 5-7.5 mg by mouth See admin instructions. Take 5 mg on Mon, Tues, Wed, Fri, and Sat Take 7.5 mg on Thurs and Sun     No current facility-administered medications for this visit.     ROS:  See HPI  Physical Exam:  Today's Vitals   09/11/22 0927 09/11/22 0933  BP: (!) 134/57 (!) 148/43  Pulse: 60   Resp: 20   Temp: 98.6 F (37 C)   TempSrc: Temporal   SpO2: 98%   Weight: 190 lb (86.2 kg)   Height: 5\' 7"  (1.702 m)    Body mass index is 29.76 kg/m.   Incision:  bilateral neck incisions healed nicely. Extremities:  moving all extremities equally   Caortid duplex 09/11/2022: Right:  no significant stenosis Left:  50-75% stenosis Vertebrals:  Bilateral vertebral arteries demonstrate antegrade flow.  Subclavians: Normal flow hemodynamics were seen in bilateral subclavian arteries    Assessment/Plan:  This is a  81 y.o. male who is s/p: right TCAR on 08/20/2022 by [name] for asymptomatic carotid artery stenosis and  left TCAR on 07/02/2022 for asymptomatic carotid artery stenosis also by [name].   -pt seen with [name].  Pt doing well and incisions have healed.  He has not had any neurological symptoms.  [name] reviewed his carotid duplex personally and given this is his first duplex since stenting, this will be his new baseline.  He had significantly more elevated velocities and significant calcium buildup prior to stenting.   -pt will return to clinic in 9 months for carotid duplex.  He knows to call sooner if there are any issues before then. -[name] discussed with pt that he can now discontinue his plavix and continue with asa and coumadin.   Doreatha Massed, [facility] [phone]   Clinic MD:  Chestine Spore

## 2022-09-12 DIAGNOSIS — I4891 Unspecified atrial fibrillation: Secondary | ICD-10-CM | POA: Diagnosis not present

## 2022-09-12 DIAGNOSIS — Z6832 Body mass index (BMI) 32.0-32.9, adult: Secondary | ICD-10-CM | POA: Diagnosis not present

## 2022-09-12 DIAGNOSIS — I700 Atherosclerosis of aorta: Secondary | ICD-10-CM | POA: Diagnosis not present

## 2022-09-12 DIAGNOSIS — E1165 Type 2 diabetes mellitus with hyperglycemia: Secondary | ICD-10-CM | POA: Diagnosis not present

## 2022-09-12 DIAGNOSIS — I5032 Chronic diastolic (congestive) heart failure: Secondary | ICD-10-CM | POA: Diagnosis not present

## 2022-09-12 DIAGNOSIS — Z299 Encounter for prophylactic measures, unspecified: Secondary | ICD-10-CM | POA: Diagnosis not present

## 2022-09-12 DIAGNOSIS — I10 Essential (primary) hypertension: Secondary | ICD-10-CM | POA: Diagnosis not present

## 2022-09-15 ENCOUNTER — Other Ambulatory Visit: Payer: Self-pay | Admitting: Vascular Surgery

## 2022-09-17 ENCOUNTER — Ambulatory Visit: Payer: Medicare PPO | Attending: Cardiology | Admitting: Cardiology

## 2022-09-17 ENCOUNTER — Encounter: Payer: Self-pay | Admitting: Cardiology

## 2022-09-17 VITALS — BP 138/62 | HR 42 | Ht 67.0 in | Wt 183.6 lb

## 2022-09-17 DIAGNOSIS — I5032 Chronic diastolic (congestive) heart failure: Secondary | ICD-10-CM

## 2022-09-17 DIAGNOSIS — I6523 Occlusion and stenosis of bilateral carotid arteries: Secondary | ICD-10-CM

## 2022-09-17 DIAGNOSIS — Z79899 Other long term (current) drug therapy: Secondary | ICD-10-CM | POA: Diagnosis not present

## 2022-09-17 DIAGNOSIS — I272 Pulmonary hypertension, unspecified: Secondary | ICD-10-CM | POA: Diagnosis not present

## 2022-09-17 DIAGNOSIS — I4821 Permanent atrial fibrillation: Secondary | ICD-10-CM | POA: Diagnosis not present

## 2022-09-17 DIAGNOSIS — I10 Essential (primary) hypertension: Secondary | ICD-10-CM | POA: Diagnosis not present

## 2022-09-17 NOTE — Patient Instructions (Addendum)
Medication Instructions:  Your physician recommends that you continue on your current medications as directed. Please refer to the Current Medication list given to you today.  Labwork: BMET today at [facility]  Testing/Procedures: none  Follow-Up: Your physician recommends that you schedule a follow-up appointment in: 6 months  Any Other Special Instructions Will Be Listed Below (If Applicable).  If you need a refill on your cardiac medications before your next appointment, please call your pharmacy.

## 2022-09-17 NOTE — Progress Notes (Signed)
Cardiology Office Note  Date: 09/17/2022   ID: Robert, Baldwin 11/21/1940, MRN [id number]  PCP:  Kirstie Peri, MD  Cardiologist:  [name], MD Electrophysiologist:  None   Chief Complaint  Patient presents with   Cardiac follow-up    History of Present Illness: Robert Baldwin is an 81 y.o. male last seen in August.  He has had follow-up in the heart failure clinic, I reviewed the recent note from November.  Reports NYHA class II dyspnea at this time on medical therapy.  He did have lab work on November 20 at which point creatinine was up to 2.35 with potassium 5.0.  Demadex was cut back to 40 mg in the morning and 20 mg in the evening.  He is due for a follow-up BMET.  Weight is down 7 pounds in the last week, although by different scales.  He has undergone bilateral TCAR, follow-up carotid Dopplers are noted below.  Continues to follow with [name].  I reviewed his medications which are otherwise stable from a cardiac perspective and outlined below.  He does not report any spontaneous bleeding problems on Coumadin.  Past Medical History:  Diagnosis Date   (HFpEF) heart failure with preserved ejection fraction (HCC)    Arthritis    Atrial fibrillation (HCC)    Bradycardia    Carotid artery disease (HCC)    Hyperlipidemia    Hypertension    Pulmonary hypertension (HCC)    Type 2 diabetes mellitus (HCC)     Past Surgical History:  Procedure Laterality Date   Cataract surgery     COLONOSCOPY     HERNIA REPAIR     RIGHT HEART CATH N/A 05/28/2022   Procedure: RIGHT HEART CATH;  Surgeon: Laurey Morale, MD;  Location: [facility] INVASIVE CV LAB;  Service: Cardiovascular;  Laterality: N/A;   RIGHT/LEFT HEART CATH AND CORONARY ANGIOGRAPHY N/A 04/30/2022   Procedure: RIGHT/LEFT HEART CATH AND CORONARY ANGIOGRAPHY;  Surgeon: Yvonne Kendall, MD;  Location: [facility] INVASIVE CV LAB;  Service: Cardiovascular;  Laterality: N/A;   TONSILLECTOMY     TRANSCAROTID ARTERY  REVASCULARIZATION  Left 07/02/2022   Procedure: Transcarotid Artery Revascularization;  Surgeon: Cephus Shelling, MD;  Location: [facility] OR;  Service: Vascular;  Laterality: Left;   TRANSCAROTID ARTERY REVASCULARIZATION  Right 08/20/2022   Procedure: Right Transcarotid Artery Revascularization;  Surgeon: Cephus Shelling, MD;  Location: [facility] OR;  Service: Vascular;  Laterality: Right;   ULTRASOUND GUIDANCE FOR VASCULAR ACCESS Right 07/02/2022   Procedure: ULTRASOUND GUIDANCE FOR VASCULAR ACCESS;  Surgeon: Cephus Shelling, MD;  Location: [facility] OR;  Service: Vascular;  Laterality: Right;   ULTRASOUND GUIDANCE FOR VASCULAR ACCESS Left 08/20/2022   Procedure: ULTRASOUND GUIDANCE FOR VASCULAR ACCESS;  Surgeon: Cephus Shelling, MD;  Location: [facility] OR;  Service: Vascular;  Laterality: Left;   YAG LASER APPLICATION Right 08/17/2016   Procedure: YAG LASER APPLICATION;  Surgeon: Susa Simmonds, MD;  Location: [facility] ORS;  Service: Ophthalmology;  Laterality: Right;    Current Outpatient Medications  Medication Sig Dispense Refill   amLODipine (NORVASC) 5 MG tablet Take 1 tablet (5 mg total) by mouth daily. 30 tablet 11   aspirin EC 81 MG tablet Take 1 tablet (81 mg total) by mouth daily. Swallow whole.     atorvastatin (LIPITOR) 40 MG tablet Take 40 mg by mouth at bedtime.     cholecalciferol (VITAMIN D) 25 MCG (1000 UNIT) tablet Take 1,000 Units by mouth in the morning and  at bedtime.     dapagliflozin propanediol (FARXIGA) 10 MG TABS tablet Take 1 tablet (10 mg total) by mouth daily. 30 tablet 5   fish oil-omega-3 fatty acids 1000 MG capsule Take 1 g by mouth every evening.     macitentan (OPSUMIT) 10 MG tablet Take 10 mg by mouth daily.     metFORMIN (GLUCOPHAGE) 1000 MG tablet Take by mouth. Takes 1 1/2 tab every morning & 1 tab every evening     Misc Natural Products (GLUCOS-CHONDROIT-MSM COMPLEX) TABS Take 2 tablets by mouth daily.     Multiple Vitamins-Minerals (MULTIVITAMIN WITH MINERALS)  tablet Take 1 tablet by mouth in the morning.  Centrum Silver     niacin 500 MG tablet Take 500 mg by mouth in the morning.     sacubitril-valsartan (ENTRESTO) 24-26 MG Take 1 tablet by mouth 2 (two) times daily. 60 tablet 11   spironolactone (ALDACTONE) 25 MG tablet Take 0.5 tablets (12.5 mg total) by mouth daily. 45 tablet 3   tadalafil, PAH, (ADCIRCA) 20 MG tablet Take 2 tablets (40 mg total) by mouth daily. 60 tablet 5   torsemide (DEMADEX) 20 MG tablet Take 2 tablets (40 mg total) by mouth daily AND 1 tablet (20 mg total) every evening. 90 tablet 3   warfarin (COUMADIN) 5 MG tablet Take 5-7.5 mg by mouth See admin instructions. Take 5 mg on Mon, Tues, Wed, Fri, and Sat Take 7.5 mg on Thurs and Sun     No current facility-administered medications for this visit.   Allergies:  Other   ROS: No palpitations or syncope.  Physical Exam: VS:  BP 138/62   Pulse (!) 42   Ht 5\' 7"  (1.702 m)   Wt 183 lb 9.6 oz (83.3 kg)   SpO2 95%   BMI 28.76 kg/m , BMI Body mass index is 28.76 kg/m.  Wt Readings from Last 3 Encounters:  09/17/22 183 lb 9.6 oz (83.3 kg)  09/11/22 190 lb (86.2 kg)  09/10/22 190 lb 3.2 oz (86.3 kg)    General: Patient appears comfortable at rest. HEENT: Conjunctiva and lids normal. Neck: Supple, no elevated JVP or carotid bruits. Lungs: Clear to auscultation, nonlabored breathing at rest. Cardiac: Irregularly irregular, 2/6 systolic murmur, no gallop. Abdomen: Soft, bowel sounds present. Extremities: Compression stockings in place bilaterally.  ECG:  An ECG dated 09/10/2022 was personally reviewed today and demonstrated:  Atrial fibrillation with slow ventricular response, right bundle branch block.  Recent Labwork: 05/08/2022: Magnesium 2.3 07/23/2022: B Natriuretic Peptide 631.3 08/20/2022: ALT 18; AST 27 08/21/2022: Hemoglobin 9.9; Platelets 162 09/10/2022: BUN 68; Creatinine, Ser 2.35; Potassium 5.0; Sodium 140     Component Value Date/Time   CHOL 103  07/03/2022 0344   TRIG 38 07/03/2022 0344   HDL 39 (L) 07/03/2022 0344   CHOLHDL 2.6 07/03/2022 0344   VLDL 8 07/03/2022 0344   LDLCALC 56 07/03/2022 0344    Other Studies Reviewed Today:  Echocardiogram 04/26/2022:  1. Left ventricular ejection fraction, by estimation, is 70 to 75%. The  left ventricle has hyperdynamic function. The left ventricle has no  regional wall motion abnormalities. There is mild left ventricular  hypertrophy. Left ventricular diastolic  parameters are indeterminate. There is the interventricular septum is  flattened in systole and diastole, consistent with right ventricular  pressure and volume overload. The average left ventricular global  longitudinal strain is -24.0 %. The global  longitudinal strain is normal.   2. Right ventricular systolic function is moderately reduced. The  right  ventricular size is moderately enlarged. Mildly increased right  ventricular wall thickness. There is severely elevated pulmonary artery  systolic pressure. The estimated right  ventricular systolic pressure is 120.3 mmHg.   3. Left atrial size was moderately dilated.   4. Right atrial size was severely dilated.   5. Right pleural effusion noted. Moderate pericardial effusion. The  pericardial effusion is posterior to the left ventricle and localized near  the right atrium. Small collection anterolaterally.   6. The mitral valve is abnormal. Trivial mitral valve regurgitation.  Moderate mitral annular calcification.   7. Tricuspid valve regurgitation is severe.   8. The aortic valve is tricuspid. Aortic valve regurgitation is not  visualized. Aortic valve sclerosis/calcification is present, without any  evidence of aortic stenosis. Aortic valve mean gradient measures 6.7 mmHg.   9. The inferior vena cava is dilated in size with <50% respiratory  variability, suggesting right atrial pressure of 15 mmHg.    Carotid Dopplers 09/11/2022: Summary:  Right Carotid: Patent  right ICA stent with no obvious evidence of                 hemodynamically significant stenosis. Posterior acoustic                 shadowing may limit ability to capture higher velocities.   Left Carotid: Patent left ICA stent with evidence of 50-75% stenosis.   Vertebrals:  Bilateral vertebral arteries demonstrate antegrade flow.  Subclavians: Normal flow hemodynamics were seen in bilateral subclavian               arteries.   Assessment and Plan:  1.  HFpEF with significant RV dysfunction and associated mixed pulmonary arterial/venous hypertension.  Fluid status has been stable, in fact weight down although by different scales.  He did have acute on chronic renal insufficiency by lab work a week ago resulting in decrease in Robert Baldwin.  Recheck BMET today.  May need to go down to once daily Demadex 40 mg next.  Otherwise remains on Aldactone, Entresto, and [place].  2.  Pulmonary arterial hypertension with mixed PAH/PVH by last right heart catheterization.  Managing fluid status and also on Opsumit and tadalafil.  Sleep study pending.  Keep follow-up in the advanced heart failure clinic.  3.  Permanent atrial fibrillation, on Coumadin for stroke prophylaxis with follow-up by PCP.  He has conduction system disease with chronic bradycardia, not clearly symptomatic.  Not on any AV nodal blockers.  4.  Bilateral carotid artery stenosis status post bilateral TCAR with follow-up by [facility].  Off Plavix at this point.  Continues on aspirin along with Coumadin.  Continue statin therapy, recent LDL 56.  Medication Adjustments/Labs and Tests Ordered: Current medicines are reviewed at length with the patient today.  Concerns regarding medicines are outlined above.   Tests Ordered: Orders Placed This Encounter  Procedures   Basic metabolic panel    Medication Changes: No orders of the defined types were placed in this encounter.   Disposition:  Follow up  3 months in the heart failure  clinic.  Signed, Jonelle Sidle, MD, [facility] 09/17/2022 1:51 PM    [facility] at [facility] [address] [place], [place], [place] [address] Phone: [phone]; Fax: [phone]

## 2022-09-18 ENCOUNTER — Telehealth: Payer: Self-pay | Admitting: Cardiology

## 2022-09-18 NOTE — Telephone Encounter (Signed)
Metformin has been removed from patients medication list. Per spouse, patient has not been on this medication for the last 6 months.

## 2022-09-18 NOTE — Telephone Encounter (Signed)
Pt c/o medication issue:  1. Name of Medication: metFORMIN (GLUCOPHAGE) 1000 MG tablet   2. How are you currently taking this medication (dosage and times per day)? Not currently taking  3. Are you having a reaction (difficulty breathing--STAT)? No   4. What is your medication issue? Patient's wife is calling to report patient has not taken this medication in the last 8 months so it can be removed from his current med list.

## 2022-09-19 ENCOUNTER — Other Ambulatory Visit: Payer: Self-pay | Admitting: *Deleted

## 2022-09-19 ENCOUNTER — Encounter: Payer: Self-pay | Admitting: Internal Medicine

## 2022-09-19 ENCOUNTER — Other Ambulatory Visit: Payer: Self-pay

## 2022-09-19 DIAGNOSIS — I6523 Occlusion and stenosis of bilateral carotid arteries: Secondary | ICD-10-CM

## 2022-09-19 MED ORDER — TORSEMIDE 20 MG PO TABS: 40.0000 mg | ORAL_TABLET | Freq: Every day | ORAL | 1 refills | Status: DC

## 2022-09-25 ENCOUNTER — Ambulatory Visit: Payer: Medicare PPO | Attending: Cardiology | Admitting: Cardiology

## 2022-09-25 DIAGNOSIS — I50812 Chronic right heart failure: Secondary | ICD-10-CM | POA: Diagnosis not present

## 2022-09-25 DIAGNOSIS — G4761 Periodic limb movement disorder: Secondary | ICD-10-CM | POA: Insufficient documentation

## 2022-09-25 DIAGNOSIS — I4891 Unspecified atrial fibrillation: Secondary | ICD-10-CM | POA: Insufficient documentation

## 2022-09-25 DIAGNOSIS — G4733 Obstructive sleep apnea (adult) (pediatric): Secondary | ICD-10-CM | POA: Insufficient documentation

## 2022-09-26 NOTE — Procedures (Signed)
Patient Name: Robert Baldwin, Robert Baldwin Date: 09/25/2022 Gender: Male D.O.B: June 10, 1941 Age (years): 82 Referring Provider: Loralie Champagne Height (inches): 72 Interpreting Physician: Fransico Him MD, ABSM Weight (lbs): 183 RPSGT: Rosebud Poles BMI: 29 MRN: [id number] Neck Size: 17.50  CLINICAL INFORMATION Sleep Study Type: Split Night CPAP  Indication for sleep study: N/A  Epworth Sleepiness Score: 7  SLEEP STUDY TECHNIQUE As per the AASM Manual for the Scoring of Sleep and Associated Events v2.3 (April 2016) with a hypopnea requiring 4% desaturations.  The channels recorded and monitored were frontal, central and occipital EEG, electrooculogram (EOG), submentalis EMG (chin), nasal and oral airflow, thoracic and abdominal wall motion, anterior tibialis EMG, snore microphone, electrocardiogram, and pulse oximetry. Continuous positive airway pressure (CPAP) was initiated when the patient met split night criteria and was titrated according to treat sleep-disordered breathing.  MEDICATIONS Medications self-administered by patient taken the night of the study : N/A  RESPIRATORY PARAMETERS Diagnostic Total AHI (/hr): 47.2  RDI (/hr):47.2  OA Index (/hr):0.8  CA Index (/hr): 0.0 REM AHI (/hr):51.4  NREM AHI (/hr):47.1  Supine AHI (/hr):72.2  Non-supine AHI (/hr):32.3 Min O2 Sat (%):81.00  Mean O2 (%): 89.52  Time below 88% (min):52.7   Titration Optimal Pressure (cm):8  AHI at Optimal Pressure (/hr):0.8  Min O2 at Optimal Pressure (%):89.00 Supine % at Optimal (%):N/A   SLEEP ARCHITECTURE The recording time for the entire night was 372.6 minutes.  During a baseline period of 169.6 minutes, the patient slept for 145.0 minutes in REM and nonREM, yielding a sleep efficiency of 85.5%. Sleep onset after lights out was 6.7 minutes with a REM latency of 104.5 minutes. The patient spent 1.72% of the night in stage N1 sleep, 72.07% in stage N2 sleep, 23.79% in stage N3 and 2.4% in  REM.  During the titration period of 194.6 minutes, the patient slept for 158.5 minutes in REM and nonREM, yielding a sleep efficiency of 81.4%. Sleep onset after CPAP initiation was 4.8 minutes with a REM latency of 73.5 minutes. The patient spent 1.58% of the night in stage N1 sleep, 58.36% in stage N2 sleep, 13.88% in stage N3 and 26.2% in REM.  CARDIAC DATA The 2 lead EKG demonstrated atrial fibrillation. The mean heart rate was 40.10 beats per minute.  LEG MOVEMENT DATA The total Periodic Limb Movements of Sleep (PLMS) were 482. The PLMS index was 95.29 .  IMPRESSIONS - Severe obstructive sleep apnea occurred during the diagnostic portion of the study (AHI = 47.2/hour). An optimal PAP pressure of 8cm H2O was obtained based on the available study data. - Moderate oxygen desaturation was noted during the diagnostic portion of the study (Min O2 =81.00%). - The patient snored with moderate snoring volume during the diagnostic portion of the study. - Atrial Fibrillation was noted during this study. - Severe periodic limb movements of sleep occurred during the study.  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  - Avoid alcohol, sedatives and other CNS depressants that may worsen sleep apnea and disrupt normal sleep architecture. - Sleep hygiene should be reviewed to assess factors that may improve sleep quality. - Weight management and regular exercise should be initiated or continued. - Return to sleep office in 6 weeks  [Electronically signed] 09/26/2022 04:22 PM  Fransico Him MD,  ABSM Diplomate, Tax adviser of Sleep Medicine

## 2022-09-27 ENCOUNTER — Telehealth (HOSPITAL_COMMUNITY): Payer: Self-pay | Admitting: Pharmacy Technician

## 2022-09-27 NOTE — Telephone Encounter (Signed)
Patient Advocate Encounter   Received notification from [facility] that prior authorization for Tadalafil [facility]) is required.   PA submitted on CoverMyMeds Key [id number] Status is pending   Will continue to follow.

## 2022-09-27 NOTE — Telephone Encounter (Signed)
Advanced Heart Failure Patient Advocate Encounter  Prior Authorization for Tadalafil [facility]) has been approved.    PA# [id number] Effective dates: 10/22/21 through 10/22/23  Archer Asa, CPhT

## 2022-09-27 NOTE — Telephone Encounter (Signed)
Patient Advocate Encounter   Received notification from [facility] that prior authorization for Opsumit is required.   PA submitted on CoverMyMeds Key [id number] Status is pending   Will continue to follow.

## 2022-09-27 NOTE — Telephone Encounter (Signed)
Advanced Heart Failure Patient Advocate Encounter  Prior Authorization for Opsumit has been approved.    PA# [id number] Effective dates: 10/22/21 through 10/22/23  Archer Asa, CPhT

## 2022-10-02 ENCOUNTER — Telehealth: Payer: Self-pay | Admitting: *Deleted

## 2022-10-02 DIAGNOSIS — G4733 Obstructive sleep apnea (adult) (pediatric): Secondary | ICD-10-CM

## 2022-10-02 DIAGNOSIS — I5032 Chronic diastolic (congestive) heart failure: Secondary | ICD-10-CM

## 2022-10-02 DIAGNOSIS — I272 Pulmonary hypertension, unspecified: Secondary | ICD-10-CM

## 2022-10-02 NOTE — Telephone Encounter (Signed)
-----   Message from Quintella Reichert, MD sent at 09/26/2022  4:25 PM EST ----- Please let patient know that they had a successful PAP titration and let DME know that orders are in EPIC.  Please set up 6 week OV with me.

## 2022-10-02 NOTE — Telephone Encounter (Signed)
The patient has been notified of the result and verbalized understanding.  All questions (if any) were answered. Robert Baldwin, CMA 10/02/2022 6:44 PM    Patient has declined the cpap at this time and he will discuss it with Dr Shirlee Latch in February.

## 2022-10-03 ENCOUNTER — Encounter: Payer: Self-pay | Admitting: *Deleted

## 2022-10-03 NOTE — Telephone Encounter (Signed)
-----   Message from Gaynelle Cage, CMA sent at 10/03/2022  2:29 PM EST -----  ----- Message ----- From: Quintella Reichert, MD Sent: 09/26/2022   4:25 PM EST To: Cv Div Sleep Studies  Please let patient know that they had a successful PAP titration and let DME know that orders are in EPIC.  Please set up 6 week OV with me.

## 2022-10-03 NOTE — Telephone Encounter (Signed)
This encounter was created in error - please disregard.

## 2022-10-10 DIAGNOSIS — Z299 Encounter for prophylactic measures, unspecified: Secondary | ICD-10-CM | POA: Diagnosis not present

## 2022-10-10 DIAGNOSIS — G4733 Obstructive sleep apnea (adult) (pediatric): Secondary | ICD-10-CM | POA: Diagnosis not present

## 2022-10-10 DIAGNOSIS — Z6831 Body mass index (BMI) 31.0-31.9, adult: Secondary | ICD-10-CM | POA: Diagnosis not present

## 2022-10-10 DIAGNOSIS — I4891 Unspecified atrial fibrillation: Secondary | ICD-10-CM | POA: Diagnosis not present

## 2022-10-10 DIAGNOSIS — M1710 Unilateral primary osteoarthritis, unspecified knee: Secondary | ICD-10-CM | POA: Diagnosis not present

## 2022-10-10 DIAGNOSIS — I10 Essential (primary) hypertension: Secondary | ICD-10-CM | POA: Diagnosis not present

## 2022-10-12 ENCOUNTER — Telehealth (HOSPITAL_COMMUNITY): Payer: Self-pay | Admitting: Cardiology

## 2022-10-12 NOTE — Telephone Encounter (Signed)
Patients wife called to report after recent appt with PCP, patient is now agreeable to cpap and would like to proceed with next steps   Message sent to Dr Malachy Mood office to assist further

## 2022-10-14 ENCOUNTER — Other Ambulatory Visit (HOSPITAL_COMMUNITY): Payer: Self-pay | Admitting: Adult Health

## 2022-10-16 IMAGING — CT CT ANGIO NECK
1 of 8 series · 6 of 33 positions shown · non-contrast
Comparison: None Available.

CLINICAL DATA: Shortness of breath, lower extremity edema bilateral
carotid stenosis



[Series 8: ax thin · axial · 0.46mm/px · z∈[+64,+224]mm · 6 of 226 slices shown]
[im 33/226  soft-tissue]
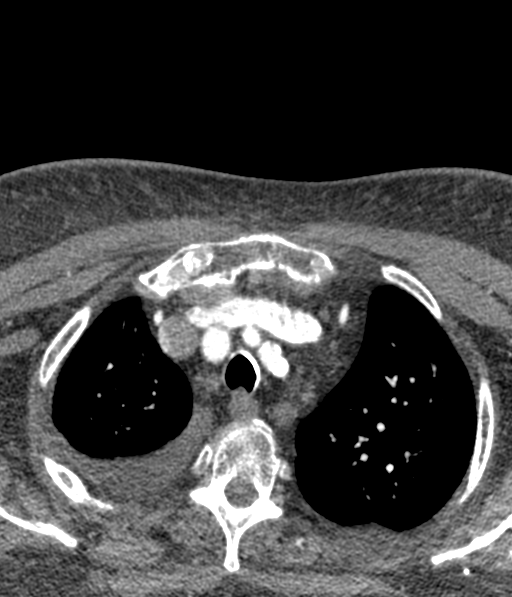
[im 65/226  bone]
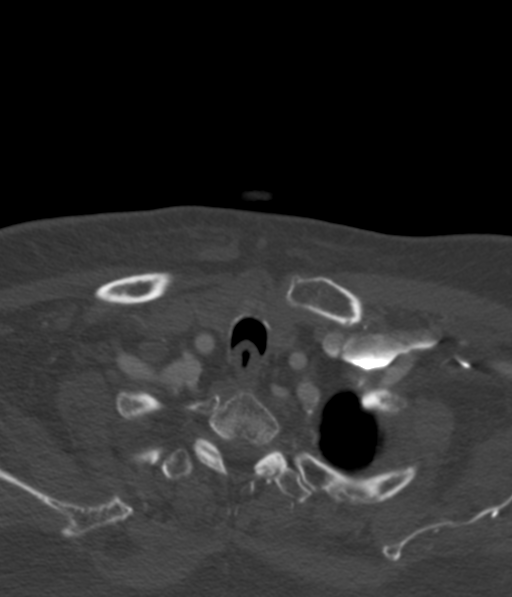
[im 97/226  soft-tissue]
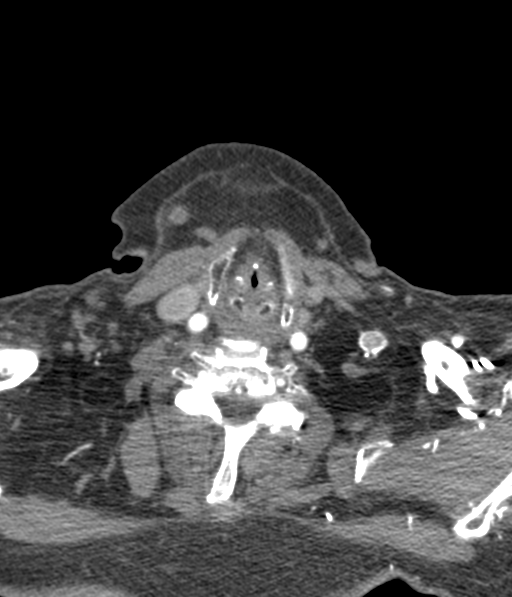
[im 129/226  bone]
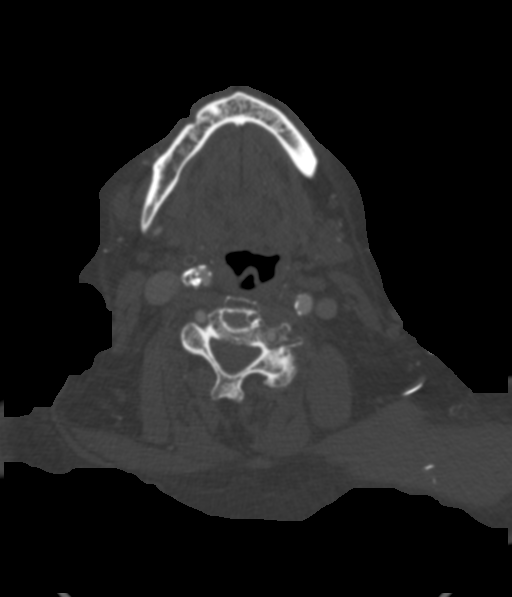
[im 161/226  soft-tissue]
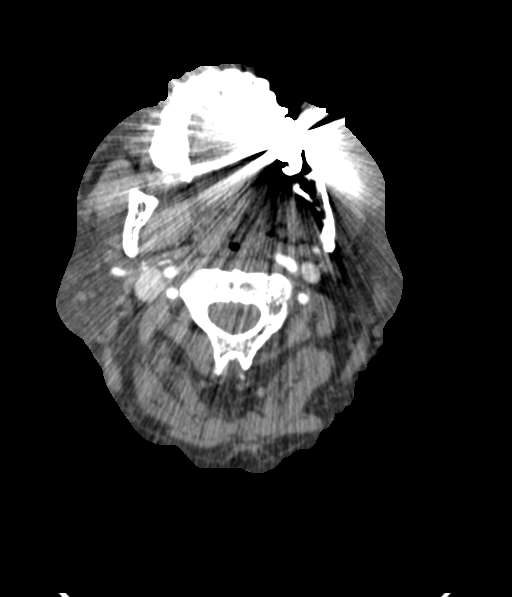
[im 193/226  bone]
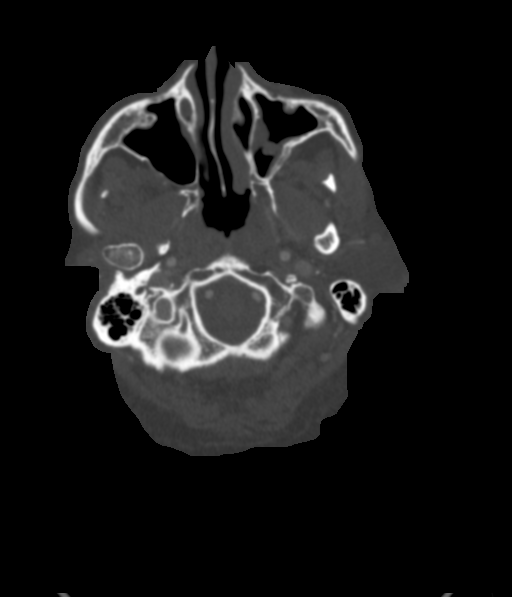

[6 of 33 positions shown; findings below may reference images not displayed]

RADIATION DOSE REDUCTION: This exam was performed according to the
departmental dose-optimization program which includes automated
exposure control, adjustment of the mA and/or kV according to
patient size and/or use of iterative reconstruction technique.

CONTRAST:  75mL OMNIPAQUE IOHEXOL 350 MG/ML SOLN
FINDINGS: Aortic arch: There is mild calcified atherosclerotic plaque in the
imaged aortic arch. The origins of the major branch vessels are
patent. The subclavian arteries are patent to the level imaged.

Right carotid system: The right common carotid artery is widely
patent. There is bulky mixed plaque in the proximal right internal
carotid artery resulting in critical stenosis. The percentage of
stenosis is difficult to calculated but is greater than 80%. The
distal right internal carotid artery is widely patent. The right
external carotid artery is patent with moderate stenosis at the
origin.

There is no evidence of dissection or aneurysm.

Left carotid system: The left common carotid artery is patent with
calcified plaque distally but no hemodynamically significant
stenosis. There is bulky mixed plaque at the bifurcation resulting
in high-grade stenosis. The percentage of stenosis is difficult to
calculate common but is estimated at greater than 80%. The distal
left internal carotid artery is patent. There is no evidence of
dissection or aneurysm.

Vertebral arteries: The vertebral arteries are patent, without
hemodynamically significant stenosis or occlusion. There is no
dissection or aneurysm. There is mild calcified plaque in the right
V4 segment without high-grade stenosis.

There is calcified plaque in the intracranial ICAs without
high-grade stenosis.

Skeleton: There is multilevel degenerative change of the cervical
spine. There is no acute osseous abnormality or suspicious osseous
lesion.

Other neck: The soft tissues of the neck are unremarkable. There is
mucosal thickening in the maxillary sinuses with evidence of chronic
sinusitis.

Upper chest: There are bilateral pleural effusions, right larger
than left, incompletely imaged. Mosaic attenuation in the lung
apices suggests air trapping/small airway disease. The main
pulmonary artery appears enlarged suggesting pulmonary hypertension.
There are prominent upper mediastinal lymph nodes without evidence
of pathologic enlargement, nonspecific.
IMPRESSION: 1. High-grade/Critical stenosis of the bilateral proximal internal
carotid arteries, right worse than left.
2. Patent vertebral arteries.
3. Right larger than left pleural effusions, incompletely imaged.
Enlargement of the main pulmonary artery suggests pulmonary
hypertension.
4. Mosaic attenuation in the lung apices suggests air trapping/small
airway disease.

## 2022-10-16 NOTE — Telephone Encounter (Addendum)
Patient has changed his mind and decided to move forward with his cpap.  Upon patient request DME selection is [facility]. Patient understands he will be contacted by [facility] to set up his cpap. Patient understands to call if [facility] does not contact him with new setup in a timely manner. Patient understands they will be called once confirmation has been received from [facility]/ that they have received their new machine to schedule 10 week follow up appointment.   [facility] notified of new cpap order  Please add to airview Patient was grateful for the call and thanked me. 

## 2022-10-16 NOTE — Addendum Note (Signed)
Addended by: Reesa Chew on: 10/16/2022 04:59 PM   Modules accepted: Orders

## 2022-10-16 NOTE — Telephone Encounter (Addendum)
Patient has changed his mind and decided to move forward with his cpap.  Upon patient request DME selection is [facility]. Patient understands he will be contacted by [facility] to set up his cpap. Patient understands to call if [facility] does not contact him with new setup in a timely manner. Patient understands they will be called once confirmation has been received from [facility]/ that they have received their new machine to schedule 10 week follow up appointment.   [facility] notified of new cpap order  Please add to airview Patient was grateful for the call and thanked me.

## 2022-10-26 DIAGNOSIS — H524 Presbyopia: Secondary | ICD-10-CM | POA: Diagnosis not present

## 2022-10-26 DIAGNOSIS — H4313 Vitreous hemorrhage, bilateral: Secondary | ICD-10-CM | POA: Diagnosis not present

## 2022-11-05 DIAGNOSIS — H35033 Hypertensive retinopathy, bilateral: Secondary | ICD-10-CM | POA: Diagnosis not present

## 2022-11-05 DIAGNOSIS — H349 Unspecified retinal vascular occlusion: Secondary | ICD-10-CM | POA: Diagnosis not present

## 2022-11-05 DIAGNOSIS — E113493 Type 2 diabetes mellitus with severe nonproliferative diabetic retinopathy without macular edema, bilateral: Secondary | ICD-10-CM | POA: Diagnosis not present

## 2022-11-05 DIAGNOSIS — H43813 Vitreous degeneration, bilateral: Secondary | ICD-10-CM | POA: Diagnosis not present

## 2022-11-05 DIAGNOSIS — H43393 Other vitreous opacities, bilateral: Secondary | ICD-10-CM | POA: Diagnosis not present

## 2022-11-05 DIAGNOSIS — H35373 Puckering of macula, bilateral: Secondary | ICD-10-CM | POA: Diagnosis not present

## 2022-11-07 DIAGNOSIS — G4733 Obstructive sleep apnea (adult) (pediatric): Secondary | ICD-10-CM | POA: Diagnosis not present

## 2022-11-09 DIAGNOSIS — Z299 Encounter for prophylactic measures, unspecified: Secondary | ICD-10-CM | POA: Diagnosis not present

## 2022-11-09 DIAGNOSIS — H6123 Impacted cerumen, bilateral: Secondary | ICD-10-CM | POA: Diagnosis not present

## 2022-11-09 DIAGNOSIS — I4891 Unspecified atrial fibrillation: Secondary | ICD-10-CM | POA: Diagnosis not present

## 2022-11-09 DIAGNOSIS — I5032 Chronic diastolic (congestive) heart failure: Secondary | ICD-10-CM | POA: Diagnosis not present

## 2022-11-09 DIAGNOSIS — E1165 Type 2 diabetes mellitus with hyperglycemia: Secondary | ICD-10-CM | POA: Diagnosis not present

## 2022-11-09 DIAGNOSIS — Z6831 Body mass index (BMI) 31.0-31.9, adult: Secondary | ICD-10-CM | POA: Diagnosis not present

## 2022-11-14 ENCOUNTER — Other Ambulatory Visit (HOSPITAL_COMMUNITY): Payer: Self-pay | Admitting: Cardiology

## 2022-11-26 ENCOUNTER — Other Ambulatory Visit (HOSPITAL_COMMUNITY): Payer: Self-pay

## 2022-11-26 MED ORDER — TADALAFIL (PAH) 20 MG PO TABS: 40.0000 mg | ORAL_TABLET | Freq: Every day | ORAL | 5 refills | Status: DC

## 2022-12-08 DIAGNOSIS — G4733 Obstructive sleep apnea (adult) (pediatric): Secondary | ICD-10-CM | POA: Diagnosis not present

## 2022-12-14 DIAGNOSIS — Z6832 Body mass index (BMI) 32.0-32.9, adult: Secondary | ICD-10-CM | POA: Diagnosis not present

## 2022-12-14 DIAGNOSIS — E1165 Type 2 diabetes mellitus with hyperglycemia: Secondary | ICD-10-CM | POA: Diagnosis not present

## 2022-12-14 DIAGNOSIS — I4891 Unspecified atrial fibrillation: Secondary | ICD-10-CM | POA: Diagnosis not present

## 2022-12-14 DIAGNOSIS — I10 Essential (primary) hypertension: Secondary | ICD-10-CM | POA: Diagnosis not present

## 2022-12-14 DIAGNOSIS — Z299 Encounter for prophylactic measures, unspecified: Secondary | ICD-10-CM | POA: Diagnosis not present

## 2022-12-14 DIAGNOSIS — G4733 Obstructive sleep apnea (adult) (pediatric): Secondary | ICD-10-CM | POA: Diagnosis not present

## 2023-01-06 DIAGNOSIS — G4733 Obstructive sleep apnea (adult) (pediatric): Secondary | ICD-10-CM | POA: Diagnosis not present

## 2023-01-09 DIAGNOSIS — E7800 Pure hypercholesterolemia, unspecified: Secondary | ICD-10-CM | POA: Diagnosis not present

## 2023-01-09 DIAGNOSIS — Z79899 Other long term (current) drug therapy: Secondary | ICD-10-CM | POA: Diagnosis not present

## 2023-01-09 DIAGNOSIS — Z299 Encounter for prophylactic measures, unspecified: Secondary | ICD-10-CM | POA: Diagnosis not present

## 2023-01-09 DIAGNOSIS — R5383 Other fatigue: Secondary | ICD-10-CM | POA: Diagnosis not present

## 2023-01-09 DIAGNOSIS — I4891 Unspecified atrial fibrillation: Secondary | ICD-10-CM | POA: Diagnosis not present

## 2023-01-09 DIAGNOSIS — I779 Disorder of arteries and arterioles, unspecified: Secondary | ICD-10-CM | POA: Diagnosis not present

## 2023-01-09 DIAGNOSIS — Z0000 Encounter for general adult medical examination without abnormal findings: Secondary | ICD-10-CM | POA: Diagnosis not present

## 2023-01-09 DIAGNOSIS — I10 Essential (primary) hypertension: Secondary | ICD-10-CM | POA: Diagnosis not present

## 2023-01-09 DIAGNOSIS — Z6832 Body mass index (BMI) 32.0-32.9, adult: Secondary | ICD-10-CM | POA: Diagnosis not present

## 2023-01-09 DIAGNOSIS — Z125 Encounter for screening for malignant neoplasm of prostate: Secondary | ICD-10-CM | POA: Diagnosis not present

## 2023-01-30 ENCOUNTER — Ambulatory Visit: Payer: Medicare PPO | Attending: Cardiology | Admitting: Cardiology

## 2023-01-30 ENCOUNTER — Encounter: Payer: Self-pay | Admitting: Cardiology

## 2023-01-30 VITALS — BP 142/58 | HR 48 | Ht 67.0 in | Wt 193.0 lb

## 2023-01-30 DIAGNOSIS — G4733 Obstructive sleep apnea (adult) (pediatric): Secondary | ICD-10-CM | POA: Diagnosis not present

## 2023-01-30 DIAGNOSIS — I10 Essential (primary) hypertension: Secondary | ICD-10-CM | POA: Diagnosis not present

## 2023-01-30 NOTE — Patient Instructions (Signed)
Medication Instructions:  Your physician recommends that you continue on your current medications as directed. Please refer to the Current Medication list given to you today.  *If you need a refill on your cardiac medications before your next appointment, please call your pharmacy*   Lab Work: None.  If you have labs (blood work) drawn today and your tests are completely normal, you will receive your results only by: MyChart Message (if you have MyChart) OR A paper copy in the mail If you have any lab test that is abnormal or we need to change your treatment, we will call you to review the results.   Testing/Procedures: None.   Follow-Up: At [facility], you and your health needs are our priority.  As part of our continuing mission to provide you with exceptional heart care, we have created designated Provider Care Teams.  These Care Teams include your primary Cardiologist (physician) and Advanced Practice Providers (APPs -  Physician Assistants and Nurse Practitioners) who all work together to provide you with the care you need, when you need it.  We recommend signing up for the patient portal called "MyChart".  Sign up information is provided on this After Visit Summary.  MyChart is used to connect with patients for Virtual Visits (Telemedicine).  Patients are able to view lab/test results, encounter notes, upcoming appointments, etc.  Non-urgent messages can be sent to your provider as well.   To learn more about what you can do with MyChart, go to [email or web address].    Your next appointment:   3 month(s)  Provider:   Dr. Traci Turner, MD   

## 2023-01-30 NOTE — Progress Notes (Addendum)
Sleep Medicine CONSULT Note    Date:  01/30/2023   ID:  Robert Baldwin, Robert Baldwin 28-May-1941, MRN [id number]  PCP:  Kirstie Peri, MD  Cardiologist: [name], MD   Chief Complaint  Patient presents with   New Patient (Initial Visit)    OSA    History of Present Illness:  Robert Baldwin is a 82 y.o. male who is being seen today for the evaluation of obstructive sleep apnea at the request of Simona Huh, MD.  This is an 82 year old male with a history of HFpEF, atrial fibrillation, bradycardia, carotid artery disease, hyperlipidemia, hypertension, pulmonary hypertension and diabetes mellitus.  He was seen back in November 2023 by Prince Rome, NP and due to his pulmonary hypertension a sleep study was ordered.  He underwent sleep study on 09/25/2022 which revealed severe obstructive sleep apnea with an AHI of 47.2/h and O2 sat as low as 81% with moderate snoring volume.  He was placed on auto CPAP from 4 to 15 cm H2O.  He is now referred for sleep medicine consultation to establish sleep care and treatment.  He is really struggling with his CPAP device.  He tried the [facility] but did not tolerate it.  He felt like he could not breath with it and would get all tangled in the tubes. He switched to a nasal mask which he likes better.  He says that he can sleep in a chair and wear the mask for 3 hours and then gets tired of it and takes it off.  He gets tangled with the tubing if he uses it in bed. He has only had masks where the tubing comes off at the mouth and not on top of the heat.  He feels the pressure is adequate.  Since going on PAP he feels rested in the am and has no significant daytime sleepiness.  He denies any significant mouth or nasal dryness or nasal congestion.  He does not think that he snores.    Past Medical History:  Diagnosis Date   (HFpEF) heart failure with preserved ejection fraction    Arthritis    Atrial fibrillation    Bradycardia    Carotid artery disease     Hyperlipidemia    Hypertension    Pulmonary hypertension    Type 2 diabetes mellitus     Past Surgical History:  Procedure Laterality Date   Cataract surgery     COLONOSCOPY     HERNIA REPAIR     RIGHT HEART CATH N/A 05/28/2022   Procedure: RIGHT HEART CATH;  Surgeon: Laurey Morale, MD;  Location: [facility] INVASIVE CV LAB;  Service: Cardiovascular;  Laterality: N/A;   RIGHT/LEFT HEART CATH AND CORONARY ANGIOGRAPHY N/A 04/30/2022   Procedure: RIGHT/LEFT HEART CATH AND CORONARY ANGIOGRAPHY;  Surgeon: Yvonne Kendall, MD;  Location: [facility] INVASIVE CV LAB;  Service: Cardiovascular;  Laterality: N/A;   TONSILLECTOMY     TRANSCAROTID ARTERY REVASCULARIZATION  Left 07/02/2022   Procedure: Transcarotid Artery Revascularization;  Surgeon: Cephus Shelling, MD;  Location: [facility] OR;  Service: Vascular;  Laterality: Left;   TRANSCAROTID ARTERY REVASCULARIZATION  Right 08/20/2022   Procedure: Right Transcarotid Artery Revascularization;  Surgeon: Cephus Shelling, MD;  Location: [facility] OR;  Service: Vascular;  Laterality: Right;   ULTRASOUND GUIDANCE FOR VASCULAR ACCESS Right 07/02/2022   Procedure: ULTRASOUND GUIDANCE FOR VASCULAR ACCESS;  Surgeon: Cephus Shelling, MD;  Location: [facility] OR;  Service: Vascular;  Laterality: Right;   ULTRASOUND GUIDANCE FOR  VASCULAR ACCESS Left 08/20/2022   Procedure: ULTRASOUND GUIDANCE FOR VASCULAR ACCESS;  Surgeon: Cephus Shellinglark, Christopher J, MD;  [facility];  Service: Vascular;  Laterality: Left;   YAG LASER APPLICATION Right 08/17/2016   Procedure: YAG LASER APPLICATION;  Surgeon: Susa Simmondsarroll F Haines, MD;  Location: [facility] ORS;  Service: Ophthalmology;  Laterality: Right;    Current Medications: Current Meds  Medication Sig   amLODipine (NORVASC) 5 MG tablet Take 1 tablet (5 mg total) by mouth daily.   aspirin EC 81 MG tablet Take 1 tablet (81 mg total) by mouth daily. Swallow whole.   atorvastatin (LIPITOR) 40 MG tablet Take 40 mg by mouth at bedtime.    cholecalciferol (VITAMIN D) 25 MCG (1000 UNIT) tablet Take 1,000 Units by mouth in the morning and at bedtime.   FARXIGA 10 MG TABS tablet TAKE 1 TABLET BY MOUTH DAILY   fish oil-omega-3 fatty acids 1000 MG capsule Take 1 g by mouth every evening.   macitentan (OPSUMIT) 10 MG tablet Take 10 mg by mouth daily.   Misc Natural Products (GLUCOS-CHONDROIT-MSM COMPLEX) TABS Take 2 tablets by mouth daily.   Multiple Vitamins-Minerals (MULTIVITAMIN WITH MINERALS) tablet Take 1 tablet by mouth in the morning.  Centrum Silver   niacin 500 MG tablet Take 500 mg by mouth in the morning.   sacubitril-valsartan (ENTRESTO) 24-26 MG Take 1 tablet by mouth 2 (two) times daily.   spironolactone (ALDACTONE) 25 MG tablet Take 0.5 tablets (12.5 mg total) by mouth daily.   tadalafil, PAH, (ADCIRCA) 20 MG tablet Take 2 tablets (40 mg total) by mouth daily.   torsemide (DEMADEX) 20 MG tablet Take 2 tablets (40 mg total) by mouth daily.   warfarin (COUMADIN) 5 MG tablet Take 5-7.5 mg by mouth See admin instructions. Take 5 mg on Mon, Tues, Wed, Fri, and Sat Take 7.5 mg on Thurs and Sun    Allergies:   Other   Social History   Socioeconomic History   Marital status: Married    Spouse name: Tammy   Number of children: Not on file   Years of education: Not on file   Highest education level: Not on file  Occupational History   Occupation: Full time    Comment: Contractor  Tobacco Use   Smoking status: Never    Passive exposure: Never   Smokeless tobacco: Never   Tobacco comments:    Never smoke 05/18/22  Vaping Use   Vaping Use: Never used  Substance and Sexual Activity   Alcohol use: No    Alcohol/week: 0.0 standard drinks of alcohol   Drug use: No   Sexual activity: Not on file  Other Topics Concern   Not on file  Social History Narrative   Not on file   Social Determinants of Health   Financial Resource Strain: Not on file  Food Insecurity: Not on file  Transportation Needs: Not on file   Physical Activity: Not on file  Stress: Not on file  Social Connections: Not on file     Family History:  The patient's family history includes Heart disease in an other family member; Stroke in an other family member.   ROS:   Please see the history of present illness.    ROS All other systems reviewed and are negative.      No data to display             PHYSICAL EXAM:   VS:  BP (!) 142/58   Pulse (!) 48  Ht 5\' 7"  (1.702 m)   Wt 193 lb (87.5 kg)   BMI 30.23 kg/m    GEN: Well nourished, well developed, in no acute distress  HEENT: normal  Neck: no JVD, carotid bruits, or masses Cardiac: RRR; no rubs, or gallops,no edema.  Intact distal pulses bilaterally. 2/6 SM at RUSB to LLSB Respiratory:  clear to auscultation bilaterally, normal work of breathing GI: soft, nontender, nondistended, + BS MS: no deformity or atrophy  Skin: warm and dry, no rash Neuro:  Alert and Oriented x 3, Strength and sensation are intact Psych: euthymic mood, full affect  Wt Readings from Last 3 Encounters:  01/30/23 193 lb (87.5 kg)  09/17/22 183 lb 9.6 oz (83.3 kg)  09/11/22 190 lb (86.2 kg)      Studies/Labs Reviewed:   Sleep study, CPAP titration, PAP compliance download  Recent Labs: 05/08/2022: Magnesium 2.3 07/23/2022: B Natriuretic Peptide 631.3 08/20/2022: ALT 18 08/21/2022: Hemoglobin 9.9; Platelets 162 09/10/2022: BUN 68; Creatinine, Ser 2.35; Potassium 5.0; Sodium 140    CHA2DS2-VASc Score = 6   This indicates a 9.7% annual risk of stroke. The patient's score is based upon: CHF History: 1 HTN History: 1 Diabetes History: 1 Stroke History: 0 Vascular Disease History: 1 Age Score: 2 Gender Score: 0      ASSESSMENT:    1. Obstructive sleep apnea syndrome   2. Primary hypertension      PLAN:  In order of problems listed above:  OSA - The PAP download performed by his DME was personally reviewed and interpreted by me today and showed an AHI of 0.4 /hr on  auto CPAP from 4-15 cm H2O with 0% compliance in using more than 4 hours nightly.  He is only used his device 3 out of the last 30 days and the days he used it was mainly 1 hour 30 minutes. -I am going to order him a nasal mask with the tubing coming off at the top of his head to see if that helps him not get tangled so much in the tubing.   -I encouraged him to try to use it every night at least 5 hours nightly.   2.  HTN -BP controlled on exam  -Continue prescription drug management with amlodipine 5 mg daily, Entresto 24-26 mg twice daily, spironolactone 12.5 mg daily with as needed refills  Followup with me in 3 months   Time Spent: 20 minutes total time of encounter, including 15 minutes spent in face-to-face patient care on the date of this encounter. This time includes coordination of care and counseling regarding above mentioned problem list. Remainder of non-face-to-face time involved reviewing chart documents/testing relevant to the patient encounter and documentation in the medical record. I have independently reviewed documentation from referring provider  Medication Adjustments/Labs and Tests Ordered: Current medicines are reviewed at length with the patient today.  Concerns regarding medicines are outlined above.  Medication changes, Labs and Tests ordered today are listed in the Patient Instructions below.  There are no Patient Instructions on file for this visit.   Signed, Armanda Magic, MD  01/30/2023 9:34 AM    [facility] [address] [place], [place], [place]  [address] Phone: [phone]; Fax: [phone]

## 2023-02-06 ENCOUNTER — Telehealth: Payer: Self-pay | Admitting: Cardiology

## 2023-02-06 ENCOUNTER — Encounter: Payer: Self-pay | Admitting: *Deleted

## 2023-02-06 DIAGNOSIS — G4733 Obstructive sleep apnea (adult) (pediatric): Secondary | ICD-10-CM | POA: Diagnosis not present

## 2023-02-06 DIAGNOSIS — I5032 Chronic diastolic (congestive) heart failure: Secondary | ICD-10-CM

## 2023-02-06 NOTE — Telephone Encounter (Signed)
Order placed to [employer] [facility] via community message 

## 2023-02-06 NOTE — Telephone Encounter (Signed)
Per Mayford Knife, -I am going to order him a nasal mask with the tubing coming off at the top of his head to see if that helps him not get tangled so much in the tubing.

## 2023-02-06 NOTE — Telephone Encounter (Signed)
Pt's wife calling to f/u on CPAP mask that was to be ordered by provider per last appt visit. Please advise

## 2023-02-06 NOTE — Telephone Encounter (Signed)
Per Mayford Knife, -I am going to order him a nasal mask with the tubing coming off at the top of his head to see if that helps him not get tangled so much in the tubing.  This encounter was created in error - please disregard.

## 2023-02-11 DIAGNOSIS — H35373 Puckering of macula, bilateral: Secondary | ICD-10-CM | POA: Diagnosis not present

## 2023-02-11 DIAGNOSIS — H35033 Hypertensive retinopathy, bilateral: Secondary | ICD-10-CM | POA: Diagnosis not present

## 2023-02-11 DIAGNOSIS — H3582 Retinal ischemia: Secondary | ICD-10-CM | POA: Diagnosis not present

## 2023-02-11 DIAGNOSIS — H43393 Other vitreous opacities, bilateral: Secondary | ICD-10-CM | POA: Diagnosis not present

## 2023-02-11 DIAGNOSIS — H43813 Vitreous degeneration, bilateral: Secondary | ICD-10-CM | POA: Diagnosis not present

## 2023-02-11 DIAGNOSIS — E113393 Type 2 diabetes mellitus with moderate nonproliferative diabetic retinopathy without macular edema, bilateral: Secondary | ICD-10-CM | POA: Diagnosis not present

## 2023-02-12 DIAGNOSIS — I5032 Chronic diastolic (congestive) heart failure: Secondary | ICD-10-CM | POA: Diagnosis not present

## 2023-02-12 DIAGNOSIS — Z299 Encounter for prophylactic measures, unspecified: Secondary | ICD-10-CM | POA: Diagnosis not present

## 2023-02-12 DIAGNOSIS — I10 Essential (primary) hypertension: Secondary | ICD-10-CM | POA: Diagnosis not present

## 2023-02-12 DIAGNOSIS — I4891 Unspecified atrial fibrillation: Secondary | ICD-10-CM | POA: Diagnosis not present

## 2023-02-18 DIAGNOSIS — G4733 Obstructive sleep apnea (adult) (pediatric): Secondary | ICD-10-CM | POA: Diagnosis not present

## 2023-03-08 DIAGNOSIS — G4733 Obstructive sleep apnea (adult) (pediatric): Secondary | ICD-10-CM | POA: Diagnosis not present

## 2023-03-14 ENCOUNTER — Other Ambulatory Visit (HOSPITAL_COMMUNITY): Payer: Self-pay | Admitting: Adult Health

## 2023-03-14 ENCOUNTER — Other Ambulatory Visit: Payer: Self-pay | Admitting: Cardiology

## 2023-03-15 DIAGNOSIS — E1165 Type 2 diabetes mellitus with hyperglycemia: Secondary | ICD-10-CM | POA: Diagnosis not present

## 2023-03-15 DIAGNOSIS — Z299 Encounter for prophylactic measures, unspecified: Secondary | ICD-10-CM | POA: Diagnosis not present

## 2023-03-15 DIAGNOSIS — I5032 Chronic diastolic (congestive) heart failure: Secondary | ICD-10-CM | POA: Diagnosis not present

## 2023-03-15 DIAGNOSIS — I4891 Unspecified atrial fibrillation: Secondary | ICD-10-CM | POA: Diagnosis not present

## 2023-03-15 DIAGNOSIS — I10 Essential (primary) hypertension: Secondary | ICD-10-CM | POA: Diagnosis not present

## 2023-04-02 NOTE — Progress Notes (Unsigned)
Cardiology Office Note  Date: 04/03/2023   ID: Robert Baldwin, Robert Baldwin [birth year], MRN [id number]  History of Present Illness: Robert Baldwin is an 82 y.o. male last seen in November 2023.  He is here for a routine visit.  Continues to do well overall, describes NYHA class II dyspnea, no exertional chest pain or palpitations.  He is still working full-time with his [employer].  Weight has been stable at home, he is using compression stockings as well.  I went over his medications which are stable from a cardiac perspective.  He had lab work in March with creatinine 1.29 at that time and LDL 38.  Plan is to have follow-up with Dr. Shirlee Latch in July with repeat echocardiogram.  He is seeing Dr. Mayford Knife for treatment of OSA, states that he is having a lot of trouble tolerating his CPAP mask.  He has a visit with her coming up as well.  Physical Exam: VS:  BP (!) 154/64   Pulse (!) 42   Ht 5\' 7"  (1.702 m)   Wt 185 lb 9.6 oz (84.2 kg)   SpO2 94%   BMI 29.07 kg/m , BMI Body mass index is 29.07 kg/m.  Wt Readings from Last 3 Encounters:  04/03/23 185 lb 9.6 oz (84.2 kg)  01/30/23 193 lb (87.5 kg)  09/17/22 183 lb 9.6 oz (83.3 kg)    General: Patient appears comfortable at rest. HEENT: Conjunctiva and lids normal. Neck: Supple, no elevated JVP or carotid bruits. Lungs: Clear to auscultation, nonlabored breathing at rest. Cardiac: Irregularly irregular, 2/6 systolic murmur. Abdomen: Soft, bowel sounds present. Extremities: Compression stockings in place.  ECG:  An ECG dated 09/10/2022 was personally reviewed today and demonstrated:  Atrial fibrillation with slow ventricular response, right bundle branch block.  Labwork: 05/08/2022: Magnesium 2.3 07/23/2022: B Natriuretic Peptide 631.3 08/20/2022: ALT 18; AST 27 08/21/2022: Hemoglobin 9.9; Platelets 162 09/10/2022: BUN 68; Creatinine, Ser 2.35; Potassium 5.0; Sodium 140     Component Value Date/Time   CHOL 103 07/03/2022 0344    TRIG 38 07/03/2022 0344   HDL 39 (L) 07/03/2022 0344   CHOLHDL 2.6 07/03/2022 0344   VLDL 8 07/03/2022 0344   LDLCALC 56 07/03/2022 0344  March 2024: Hemoglobin 10.7, platelets 149, BUN 60, creatinine 2.19, potassium 5.1, AST 24, ALT 20, cholesterol 105, triglycerides 41, HDL 56, LDL 38, TSH 1.0  Other Studies Reviewed Today:  No interval cardiac testing for review today.  Assessment and Plan:  1.  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 Continues to follow in the advanced heart failure clinic and remains on tadalafil and Opsumit.  He will see Dr. Shirlee Latch in July, arrange follow-up echocardiogram at that time.  Weight has been stable and he continues to use compression stockings as well.  Continue Clifton Custard, Aldactone, and Demadex otherwise.  Not on potassium supplement with high normal levels at baseline.  2.  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  Continues to follow with [name] with [facility].  Carotid Dopplers in November 2023 showed patent RICA stent site and patent LICA stent site with 50 to 75% stenosis.  Continues on aspirin and Lipitor.  Keep follow-up with [facility] as scheduled.  3.  Permanent atrial fibrillation with CHA2DS2-VASc score 5.  He is on Coumadin for stroke prophylaxis.  Chronic, slow ventricular response and not clearly symptomatic.  He is not on beta-blockers due to this.  4.  OSA on CPAP.  Keep follow-up with Dr. Mayford Knife.  5.  CKD stage IIIb-IV.  Recent creatinine down to 2.19.  6.  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  LDL 38 in March.  Disposition:  Follow up  6 months.  Signed, Jonelle Sidle, M.D., F.A.C.C. [facility]

## 2023-04-03 ENCOUNTER — Encounter: Payer: Self-pay | Admitting: Cardiology

## 2023-04-03 ENCOUNTER — Ambulatory Visit: Payer: Medicare PPO | Attending: Cardiology | Admitting: Cardiology

## 2023-04-03 VITALS — BP 144/64 | HR 42 | Ht 67.0 in | Wt 185.6 lb

## 2023-04-03 DIAGNOSIS — I4821 Permanent atrial fibrillation: Secondary | ICD-10-CM

## 2023-04-03 DIAGNOSIS — I6523 Occlusion and stenosis of bilateral carotid arteries: Secondary | ICD-10-CM | POA: Diagnosis not present

## 2023-04-03 DIAGNOSIS — I5032 Chronic diastolic (congestive) heart failure: Secondary | ICD-10-CM

## 2023-04-03 DIAGNOSIS — I272 Pulmonary hypertension, unspecified: Secondary | ICD-10-CM | POA: Diagnosis not present

## 2023-04-03 NOTE — Patient Instructions (Addendum)
Medication Instructions:  Your physician recommends that you continue on your current medications as directed. Please refer to the Current Medication list given to you today.  Labwork: none  Testing/Procedures: Your physician has requested that you have an echocardiogram in July 2024. Echocardiography is a painless test that uses sound waves to create images of your heart. It provides your doctor with information about the size and shape of your heart and how well your heart's chambers and valves are working. This procedure takes approximately one hour. There are no restrictions for this procedure. Please do NOT wear cologne, perfume, aftershave, or lotions (deodorant is allowed). Please arrive 15 minutes prior to your appointment time.  Follow-Up: Your physician recommends that you schedule a follow-up appointment in: 6 months  Any Other Special Instructions Will Be Listed Below (If Applicable).  If you need a refill on your cardiac medications before your next appointment, please call your pharmacy.

## 2023-04-08 DIAGNOSIS — G4733 Obstructive sleep apnea (adult) (pediatric): Secondary | ICD-10-CM | POA: Diagnosis not present

## 2023-04-16 DIAGNOSIS — I5032 Chronic diastolic (congestive) heart failure: Secondary | ICD-10-CM | POA: Diagnosis not present

## 2023-04-16 DIAGNOSIS — I4891 Unspecified atrial fibrillation: Secondary | ICD-10-CM | POA: Diagnosis not present

## 2023-04-16 DIAGNOSIS — Z299 Encounter for prophylactic measures, unspecified: Secondary | ICD-10-CM | POA: Diagnosis not present

## 2023-04-16 DIAGNOSIS — I10 Essential (primary) hypertension: Secondary | ICD-10-CM | POA: Diagnosis not present

## 2023-04-23 DIAGNOSIS — Z299 Encounter for prophylactic measures, unspecified: Secondary | ICD-10-CM | POA: Diagnosis not present

## 2023-04-23 DIAGNOSIS — M25449 Effusion, unspecified hand: Secondary | ICD-10-CM | POA: Diagnosis not present

## 2023-04-23 DIAGNOSIS — I10 Essential (primary) hypertension: Secondary | ICD-10-CM | POA: Diagnosis not present

## 2023-04-23 DIAGNOSIS — M109 Gout, unspecified: Secondary | ICD-10-CM | POA: Diagnosis not present

## 2023-04-29 ENCOUNTER — Ambulatory Visit: Payer: Medicare PPO | Attending: Cardiology | Admitting: Cardiology

## 2023-04-29 ENCOUNTER — Encounter: Payer: Self-pay | Admitting: Cardiology

## 2023-04-29 VITALS — BP 134/58 | HR 48 | Ht 67.0 in | Wt 179.4 lb

## 2023-04-29 DIAGNOSIS — I10 Essential (primary) hypertension: Secondary | ICD-10-CM

## 2023-04-29 DIAGNOSIS — G4733 Obstructive sleep apnea (adult) (pediatric): Secondary | ICD-10-CM | POA: Diagnosis not present

## 2023-04-29 NOTE — Addendum Note (Signed)
Addended by: Frutoso Schatz on: 04/29/2023 09:01 AM   Modules accepted: Orders

## 2023-04-29 NOTE — Patient Instructions (Addendum)
Medication Instructions:  Your physician recommends that you continue on your current medications as directed. Please refer to the Current Medication list given to you today.  *If you need a refill on your cardiac medications before your next appointment, please call your pharmacy*  Follow-Up: At [facility], you and your health needs are our priority.  As part of our continuing mission to provide you with exceptional heart care, we have created designated Provider Care Teams.  These Care Teams include your primary Cardiologist (physician) and Advanced Practice Providers (APPs -  Physician Assistants and Nurse Practitioners) who all work together to provide you with the care you need, when you need it.  Your next appointment:   1 year   Provider:   Dr. Mayford Knife Other Instructions You have been referred to see Dr. Jenne Pane with ENT to discuss the [facility] device

## 2023-04-29 NOTE — Progress Notes (Signed)
Sleep Medicine  Note    Date:  04/29/2023   ID:  Robert Baldwin, Robert Baldwin Aug 09, 1941, MRN [id number]  PCP:  Kirstie Peri, MD  Cardiologist: [name], MD   Chief Complaint  Patient presents with   Sleep Apnea   Hypertension    History of Present Illness:  Robert Baldwin is a 82 y.o. male with a history of HFpEF, atrial fibrillation, bradycardia, carotid artery disease, hyperlipidemia, hypertension, pulmonary hypertension and diabetes mellitus.  He was seen back in November 2023 by Prince Rome, NP and due to his pulmonary hypertension a sleep study was ordered.  He underwent sleep study on 09/25/2022 which revealed severe obstructive sleep apnea with an AHI of 47.2/h and O2 sat as low as 81% with moderate snoring volume.  He was placed on auto CPAP from 4 to 15 cm H2O.  He is now referred for sleep medicine consultation to establish sleep care and treatment.  At last office visit he was really struggling with his CPAP device.  He tried the [facility] but did not tolerate it.  He felt like he could not breath with it and would get all tangled in the tubes. He switched to a nasal mask which he liked better.  He could sleep in a chair and wear the mask for 3 hours and then would get tired of it and take it off.  He would get tangled with the tubing if he uses it in bed. He has only had masks where the tubing comes off at the mouth and not on top of the heat. I ordered him a nasal mask with the tubing coming off at the top of his head to see if that would help him not get tangled so much in the tubing.  He is now here for follow-up.  He tells me that he got a new mask with the tubing coming off the top of his head but he could not stand that one either and says that he just cannot use the CPAP.  Past Medical History:  Diagnosis Date   (HFpEF) heart failure with preserved ejection fraction (HCC)    Arthritis    Atrial fibrillation (HCC)    Bradycardia    Carotid artery disease (HCC)     Hyperlipidemia    Hypertension    Pulmonary hypertension (HCC)    Type 2 diabetes mellitus (HCC)     Past Surgical History:  Procedure Laterality Date   Cataract surgery     COLONOSCOPY     HERNIA REPAIR     RIGHT HEART CATH N/A 05/28/2022   Procedure: RIGHT HEART CATH;  Surgeon: Laurey Morale, MD;  Location: [facility] INVASIVE CV LAB;  Service: Cardiovascular;  Laterality: N/A;   RIGHT/LEFT HEART CATH AND CORONARY ANGIOGRAPHY N/A 04/30/2022   Procedure: RIGHT/LEFT HEART CATH AND CORONARY ANGIOGRAPHY;  Surgeon: Yvonne Kendall, MD;  Location: [facility] INVASIVE CV LAB;  Service: Cardiovascular;  Laterality: N/A;   TONSILLECTOMY     TRANSCAROTID ARTERY REVASCULARIZATION  Left 07/02/2022   Procedure: Transcarotid Artery Revascularization;  Surgeon: Cephus Shelling, MD;  Location: [facility] OR;  Service: Vascular;  Laterality: Left;   TRANSCAROTID ARTERY REVASCULARIZATION  Right 08/20/2022   Procedure: Right Transcarotid Artery Revascularization;  Surgeon: Cephus Shelling, MD;  Location: [facility] OR;  Service: Vascular;  Laterality: Right;   ULTRASOUND GUIDANCE FOR VASCULAR ACCESS Right 07/02/2022   Procedure: ULTRASOUND GUIDANCE FOR VASCULAR ACCESS;  Surgeon: Cephus Shelling, MD;  Location: [facility] OR;  Service: Vascular;  Laterality: Right;   ULTRASOUND GUIDANCE FOR VASCULAR ACCESS Left 08/20/2022   Procedure: ULTRASOUND GUIDANCE FOR VASCULAR ACCESS;  Surgeon: Cephus Shelling, MD;  Location: [facility] OR;  Service: Vascular;  Laterality: Left;   YAG LASER APPLICATION Right 08/17/2016   Procedure: YAG LASER APPLICATION;  Surgeon: Susa Simmonds, MD;  Location: [facility] ORS;  Service: Ophthalmology;  Laterality: Right;    Current Medications: Current Meds  Medication Sig   amLODipine (NORVASC) 5 MG tablet Take 1 tablet (5 mg total) by mouth daily.   aspirin EC 81 MG tablet Take 1 tablet (81 mg total) by mouth daily. Swallow whole.   atorvastatin (LIPITOR) 40 MG tablet Take 40 mg by mouth at bedtime.    cholecalciferol (VITAMIN D) 25 MCG (1000 UNIT) tablet Take 1,000 Units by mouth in the morning and at bedtime.   colchicine 0.6 MG tablet Take 0.6 mg by mouth 2 (two) times daily.   FARXIGA 10 MG TABS tablet TAKE 1 TABLET BY MOUTH DAILY   fish oil-omega-3 fatty acids 1000 MG capsule Take 1 g by mouth every evening.   macitentan (OPSUMIT) 10 MG tablet Take 10 mg by mouth daily.   Misc Natural Products (GLUCOS-CHONDROIT-MSM COMPLEX) TABS Take 2 tablets by mouth daily.   Multiple Vitamins-Minerals (MULTIVITAMIN WITH MINERALS) tablet Take 1 tablet by mouth in the morning.  Centrum Silver   niacin 500 MG tablet Take 500 mg by mouth in the morning.   sacubitril-valsartan (ENTRESTO) 24-26 MG Take 1 tablet by mouth 2 (two) times daily.   spironolactone (ALDACTONE) 25 MG tablet Take 0.5 tablets (12.5 mg total) by mouth daily.   tadalafil, PAH, (ADCIRCA) 20 MG tablet Take 2 tablets (40 mg total) by mouth daily.   torsemide (DEMADEX) 20 MG tablet TAKE 2 TABLETS BY MOUTH DAILY   warfarin (COUMADIN) 5 MG tablet Take 5-7.5 mg by mouth See admin instructions. Take 5 mg on Mon, Tues, Wed, Fri, and Sat Take 7.5 mg on Thurs and Sun    Allergies:   Other   Social History   Socioeconomic History   Marital status: Married    Spouse name: Tammy   Number of children: Not on file   Years of education: Not on file   Highest education level: Not on file  Occupational History   Occupation: Full time    Comment: Contractor  Tobacco Use   Smoking status: Never    Passive exposure: Never   Smokeless tobacco: Never   Tobacco comments:    Never smoke 05/18/22  Vaping Use   Vaping Use: Never used  Substance and Sexual Activity   Alcohol use: No    Alcohol/week: 0.0 standard drinks of alcohol   Drug use: No   Sexual activity: Not on file  Other Topics Concern   Not on file  Social History Narrative   Not on file   Social Determinants of Health   Financial Resource Strain: Not on file  Food  Insecurity: Not on file  Transportation Needs: Not on file  Physical Activity: Not on file  Stress: Not on file  Social Connections: Not on file     Family History:  The patient's family history includes Heart disease in an other family member; Stroke in an other family member.   ROS:   Please see the history of present illness.    ROS All other systems reviewed and are negative.      No data to display  PHYSICAL EXAM:   VS:  BP (!) 134/58   Pulse (!) 48   Ht 5\' 7"  (1.702 m)   Wt 179 lb 6.4 oz (81.4 kg)   SpO2 95%   BMI 28.10 kg/m    GEN: Well nourished, well developed in no acute distress HEENT: Normal NECK: No JVD; No carotid bruits LYMPHATICS: No lymphadenopathy CARDIAC:RRR, no murmurs, rubs, gallops RESPIRATORY:  Clear to auscultation without rales, wheezing or rhonchi  ABDOMEN: Soft, non-tender, non-distended MUSCULOSKELETAL:  No edema; No deformity  SKIN: Warm and dry NEUROLOGIC:  Alert and oriented x 3 PSYCHIATRIC:  Normal affect   Wt Readings from Last 3 Encounters:  04/29/23 179 lb 6.4 oz (81.4 kg)  04/03/23 185 lb 9.6 oz (84.2 kg)  01/30/23 193 lb (87.5 kg)      Studies/Labs Reviewed:   Sleep study, CPAP titration, PAP compliance download  Recent Labs: 05/08/2022: Magnesium 2.3 07/23/2022: B Natriuretic Peptide 631.3 08/20/2022: ALT 18 08/21/2022: Hemoglobin 9.9; Platelets 162 09/10/2022: BUN 68; Creatinine, Ser 2.35; Potassium 5.0; Sodium 140    CHA2DS2-VASc Score = 6   This indicates a 9.7% annual risk of stroke. The patient's score is based upon: CHF History: 1 HTN History: 1 Diabetes History: 1 Stroke History: 0 Vascular Disease History: 1 Age Score: 2 Gender Score: 0      ASSESSMENT:    1. Obstructive sleep apnea syndrome   2. Primary hypertension       PLAN:  In order of problems listed above:  OSA  -he has had a lot of issues finding a mask and tubing that he could tolerate without getting tangled up in  the tubing-I am going to order him a nasal mask with the tubing coming off at the top of his head to see if that helps him not get tangled so much in the tubing.   -At last office visit I ordered him a nasal mask with the tubing coming off at the top of his head but that did not see to help either -I encouraged him to try to use it every night at least 5 hours nightly.  -Given that he has severe obstructive sleep apnea he may be a candidate for the inspire device. -I discussed how the inspire device works and recommended we refer him to Dr. Jenne Pane with [facility] for sleep induced endoscopy to see if he would be a candidate since he is intolerant to CPAP therapy.  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  2.  HTN -BP is adequately trolls on exam today -Continue prescription drug management with amlodipine 5 mg daily, Entresto 24-26 mg twice daily, spironolactone 12.5 mg daily with as needed refills    Time Spent: 20 minutes total time of encounter, including 15 minutes spent in face-to-face patient care on the date of this encounter. This time includes coordination of care and counseling regarding above mentioned problem list. Remainder of non-face-to-face time involved reviewing chart documents/testing relevant to the patient encounter and documentation in the medical record. I have independently reviewed documentation from referring provider  Medication Adjustments/Labs and Tests Ordered: Current medicines are reviewed at length with the patient  today.  Concerns regarding medicines are outlined above.  Medication changes, Labs and Tests ordered today are listed in  the Patient Instructions below.  There are no Patient Instructions on file for this visit.   Signed, Armanda Magic, MD  04/29/2023 8:48 AM    [facility] [address] [place], [place], [place]  [address] Phone: [phone]; Fax: [phone]

## 2023-05-08 DIAGNOSIS — G4733 Obstructive sleep apnea (adult) (pediatric): Secondary | ICD-10-CM | POA: Diagnosis not present

## 2023-05-12 ENCOUNTER — Other Ambulatory Visit (HOSPITAL_COMMUNITY): Payer: Self-pay | Admitting: Cardiology

## 2023-05-14 ENCOUNTER — Encounter (HOSPITAL_COMMUNITY): Payer: Self-pay | Admitting: Cardiology

## 2023-05-14 ENCOUNTER — Other Ambulatory Visit (HOSPITAL_COMMUNITY): Payer: Self-pay

## 2023-05-14 ENCOUNTER — Telehealth (HOSPITAL_COMMUNITY): Payer: Self-pay

## 2023-05-14 ENCOUNTER — Ambulatory Visit (HOSPITAL_BASED_OUTPATIENT_CLINIC_OR_DEPARTMENT_OTHER)
Admission: RE | Admit: 2023-05-14 | Discharge: 2023-05-14 | Disposition: A | Discharge status: Home / Self Care | Payer: Medicare PPO | Source: Ambulatory Visit | Attending: Cardiology | Admitting: Cardiology

## 2023-05-14 ENCOUNTER — Ambulatory Visit (HOSPITAL_COMMUNITY)
Admission: RE | Admit: 2023-05-14 | Discharge: 2023-05-14 | Disposition: A | Discharge status: Home / Self Care | Payer: Medicare PPO | Source: Ambulatory Visit | Attending: Cardiology | Admitting: Cardiology

## 2023-05-14 VITALS — BP 158/54 | HR 53 | Wt 178.4 lb

## 2023-05-14 DIAGNOSIS — N1832 Chronic kidney disease, stage 3b: Secondary | ICD-10-CM | POA: Insufficient documentation

## 2023-05-14 DIAGNOSIS — I4821 Permanent atrial fibrillation: Secondary | ICD-10-CM | POA: Diagnosis not present

## 2023-05-14 DIAGNOSIS — I5032 Chronic diastolic (congestive) heart failure: Secondary | ICD-10-CM

## 2023-05-14 DIAGNOSIS — I6523 Occlusion and stenosis of bilateral carotid arteries: Secondary | ICD-10-CM | POA: Insufficient documentation

## 2023-05-14 DIAGNOSIS — E1122 Type 2 diabetes mellitus with diabetic chronic kidney disease: Secondary | ICD-10-CM | POA: Diagnosis not present

## 2023-05-14 DIAGNOSIS — I342 Nonrheumatic mitral (valve) stenosis: Secondary | ICD-10-CM | POA: Diagnosis not present

## 2023-05-14 DIAGNOSIS — Z7901 Long term (current) use of anticoagulants: Secondary | ICD-10-CM | POA: Diagnosis not present

## 2023-05-14 DIAGNOSIS — I272 Pulmonary hypertension, unspecified: Secondary | ICD-10-CM | POA: Diagnosis not present

## 2023-05-14 DIAGNOSIS — I130 Hypertensive heart and chronic kidney disease with heart failure and stage 1 through stage 4 chronic kidney disease, or unspecified chronic kidney disease: Secondary | ICD-10-CM | POA: Insufficient documentation

## 2023-05-14 DIAGNOSIS — I350 Nonrheumatic aortic (valve) stenosis: Secondary | ICD-10-CM | POA: Diagnosis not present

## 2023-05-14 DIAGNOSIS — I5023 Acute on chronic systolic (congestive) heart failure: Secondary | ICD-10-CM | POA: Diagnosis present

## 2023-05-14 LAB — PROTIME-INR
INR: 3.1 — ABNORMAL HIGH (ref 0.8–1.2)
Prothrombin Time: 32.3 seconds — ABNORMAL HIGH (ref 11.4–15.2)

## 2023-05-14 LAB — ECHOCARDIOGRAM COMPLETE
AR max vel: 2.31 cm2
AV Area VTI: 2.86 cm2
AV Area mean vel: 2.68 cm2
AV Mean grad: 15 mmHg
AV Peak grad: 32.3 mmHg
Ao pk vel: 2.84 m/s
Area-P 1/2: 3.69 cm2
Calc EF: 73.8 %
MV VTI: 2.28 cm2
S' Lateral: 3.1 cm
Single Plane A2C EF: 75.1 %
Single Plane A4C EF: 76.6 %

## 2023-05-14 LAB — CBC
HCT: 32.1 % — ABNORMAL LOW (ref 39.0–52.0)
Hemoglobin: 10.4 g/dL — ABNORMAL LOW (ref 13.0–17.0)
MCH: 30.1 pg (ref 26.0–34.0)
MCHC: 32.4 g/dL (ref 30.0–36.0)
MCV: 93 fL (ref 80.0–100.0)
Platelets: 175 10*3/uL (ref 150–400)
RBC: 3.45 MIL/uL — ABNORMAL LOW (ref 4.22–5.81)
RDW: 14.8 % (ref 11.5–15.5)
WBC: 9.3 10*3/uL (ref 4.0–10.5)
nRBC: 0 % (ref 0.0–0.2)

## 2023-05-14 LAB — BASIC METABOLIC PANEL
Anion gap: 13 (ref 5–15)
BUN: 102 mg/dL — ABNORMAL HIGH (ref 8–23)
CO2: 23 mmol/L (ref 22–32)
Calcium: 9 mg/dL (ref 8.9–10.3)
Chloride: 102 mmol/L (ref 98–111)
Creatinine, Ser: 3.32 mg/dL — ABNORMAL HIGH (ref 0.61–1.24)
GFR, Estimated: 18 mL/min — ABNORMAL LOW (ref >60)
Glucose, Bld: 125 mg/dL — ABNORMAL HIGH (ref 70–99)
Potassium: 5.3 mmol/L — ABNORMAL HIGH (ref 3.5–5.1)
Sodium: 138 mmol/L (ref 135–145)

## 2023-05-14 LAB — LIPID PANEL
Cholesterol: 98 mg/dL (ref 0–200)
HDL: 42 mg/dL (ref >40)
LDL Cholesterol: 47 mg/dL (ref 0–99)
Total CHOL/HDL Ratio: 2.3 RATIO
Triglycerides: 44 mg/dL (ref <150)
VLDL: 9 mg/dL (ref 0–40)

## 2023-05-14 MED ORDER — AMLODIPINE BESYLATE 10 MG PO TABS: 10.0000 mg | ORAL_TABLET | Freq: Every day | ORAL | 11 refills | Status: DC

## 2023-05-14 MED ORDER — TORSEMIDE 20 MG PO TABS: ORAL_TABLET | ORAL | 1 refills | Status: DC

## 2023-05-14 NOTE — Progress Notes (Signed)
6 Min Walk Test Completed  Pt ambulated 887ft (259.47m) O2 Sat ranged 90-93 on None-L oxygen HR ranged 75-91

## 2023-05-14 NOTE — Telephone Encounter (Signed)
Advanced Heart Failure Patient Advocate Encounter  The patient was approved for a [employer] grant that will help cover the cost of Opsumit.  Total amount awarded, $10,000.  Effective: 04/14/2023 - 04/12/2024.  BIN [id number] PCN [id number] Group [id number] ID [id number]  Pharmacy provided with approval and processing information. Patient informed in office.  Burnell Blanks, CPhT Rx Patient Advocate Phone: [phone]

## 2023-05-14 NOTE — H&P (View-Only) (Signed)
Advanced Heart Failure Clinic Note   PCP: Kirstie Peri, MD Primary Cardiologist: Dr. Diona Browner HF Cardiologist: Dr. Shirlee Latch  HPI: Robert Baldwin is a 82 y.o. male with history of b/l carotid artery stenosis, DM II, HTN, HLD, permanent atrial fibrillation. He follows with Dr. Diona Browner in the cardiology clinic.    Saw [facility] for carotid artery stenosis in 6/23. Was noting more dyspnea with exertion and lower extremity edema for about a month. He was subsequently started on po lasix 20 mg daily.   He was seen by Dr. Diona Browner in the clinic 04/26/22 for evaluation of recent symptoms and pre-operative evaluation in preparation for left CEA followed by staged right CEA. Echo day of the visit showed EF 70-75%, hyperdynamic LV function, mild LVH, interventricular septum flattened in systole and diastole consistent with RV pressure and volume overload, RV moderately enlarged and moderately reduced, RVSP 120 mmHg, moderate LAE, severe RAE, moderate pericardial effusion, severe TR, dilated IVC. Patient was scheduled for outpatient R/LHC to better assess hemodynamics and further characterize pulmonary hypertension.    [facility] 7/23 showed no significant CAD, RA mean 25, PA 115/30 (58), LVEDP 22 mmHg, PCWP not obtained, Fick CO/CI 6.4/3.1, PAPi 3.4. PVR 5.6 WU. 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 He was diuresed with IV lasix. V/Q scan negative for chronic PE. ANA+. All other serologic test -. HRCT with no definite ILD. Started on tadalafil. Diuresed 24 lbs. Hospitalization complicated by right groin hematoma and received 1 unit PRBCs. Discharged home, weight 179 lbs.  Repeat RHC 8/23 showed elevated PCWP and severe mixed pulmonary arterial/venous hypertension, but PA pressures significantly lower than prior RHC. He was instructed to increase torsemide to 40 mg daily alternating with 20 mg every other day. [name] and [name] held with hyperkalemia.  Patient  had awake left TCAR by [name] in 9/23, no complications.   Follow up 10/23, volume overloaded, weight up 18 lbs. Torsemide increased to 40 bid and spiro 12.5 added back.   S/p right TCAR 10/23. Tolerated procedure well.   Echo was done today and reviewed, EF 60-65%, D-shaped interventricular septum suggestive of RV pressure/volume overload, moderate RV enlargement with mildly decreased systolic function, PASP 78 mmHg, mild mitral stenosis mean gradient 4, mild aortic stenosis mean gradient 15.   Patient returns for followup of CHF.  We have not seen him in this office since 11/23.  Today, creatinine is up to 3.32 (was 2.35 at last check in 11/23).  Weight is down 12 lbs. He only occasionally uses CPAP.  No dyspnea walking up stairs or on flat ground.  He is short of breath carrying a load. No orthopnea/PND.  No chest pain.  No lightheadedness.   ECG (personally reviewed): AF 45 bpm, rBBB  Labs (6/23): K 3.8, creatinine 1.53 Labs (7/23): K 5.5, creatinine 1.73 Labs (8/23): K 4.6, creatinine 1.5 Labs (9/23): LDL 56, K 3.9, creatinine 6.06 Labs (10/23): K 4.4, creatinine 1.94 Labs (11/23): K 5, creatinine 2.35 Labs (7/24): LDL 47, hgb 10.4, K 5.3, creatinine 3.32, BUN 102  6 minute walk (7/24): 259 m  PMH: 1. HTN 2. Hyperlipidemia 3. Type 2 diabetes 4. CKD stage 3 5. Atrial fibrillation: Permanent.  6. Carotid stenosis: CTA neck (6/23) with high grade stenosis of the bilateral internal carotid arteries.  - S/p left TCAR 9/23.  - S/p right TCAR 10/23. 7. 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  - R/LHC (7/23): no significant CAD; RA mean 25, PA 115/30 (58), LVEDP 22 mmHg,  PCWP not obtained, Fick CO/CI 6.4/3.1, PAPi 3.4. PVR 5.6 WU. - RHC (8/23): Mean RA 8, PA 85/40 mean 41, mean PCWP 22, CI 2.86, PVR 3.4 WU - Echo (7/24): EF 60-65%, D-shaped interventricular septum suggestive of RV pressure/volume overload, moderate RV enlargement with mildly decreased systolic function, PASP 78 mmHg, mild mitral stenosis mean gradient 4, mild aortic stenosis mean gradient 15.  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  See RHC and echo data above.   - V/Q scan (7/23): No evidence for chronic PE.  - High resolution CT chest (7/23): No definite ILD.  - +ANA  Current Outpatient Medications  Medication Sig Dispense Refill   aspirin EC 81 MG tablet Take 1 tablet (81 mg total) by mouth daily. Swallow whole.     atorvastatin (LIPITOR) 40 MG tablet Take 40 mg by mouth at bedtime.     cholecalciferol (VITAMIN D) 25 MCG (1000 UNIT) tablet Take 1,000 Units by mouth in the morning and at bedtime.     FARXIGA 10 MG TABS tablet TAKE 1 TABLET BY MOUTH DAILY 30 tablet 4   fish oil-omega-3 fatty acids 1000 MG capsule Take 1 g by mouth every evening.     macitentan (OPSUMIT) 10 MG tablet Take 10 mg by mouth daily.     Misc Natural Products (GLUCOS-CHONDROIT-MSM COMPLEX) TABS Take 2 tablets by mouth daily.     Multiple Vitamins-Minerals (MULTIVITAMIN WITH MINERALS) tablet Take 1 tablet by mouth in the morning.  Centrum Silver     niacin 500 MG tablet Take 500 mg by mouth in the morning.     tadalafil (CIALIS) 20 MG tablet TAKE 2 TABLETS BY MOUTH DAILY 180 tablet 3   warfarin (COUMADIN) 5 MG tablet Take 5-7.5 mg by mouth See admin instructions. Take 5 mg on Mon, Tues, Wed, Fri, and Sat Take 7.5 mg on Thurs and Sun     amLODipine (NORVASC) 10 MG tablet Take 1 tablet (10 mg total) by mouth daily. 30 tablet 11   torsemide (DEMADEX) 20 MG tablet Take 2 tablets (40 mg total) by mouth every morning AND 1 tablet (20 mg total) every evening. 180 tablet 1   No current  facility-administered medications for this encounter.   Allergies  Allergen Reactions   Other Rash    Bandaids   Social History   Socioeconomic History   Marital status: Married    Spouse name: Tammy   Number of children: Not on file   Years of education: Not on file   Highest education level: Not on file  Occupational History   Occupation: Full time    Comment: Contractor  Tobacco Use   Smoking status: Never    Passive exposure: Never   Smokeless tobacco: Never   Tobacco comments:    Never smoke 05/18/22  Vaping Use   Vaping status: Never Used  Substance and Sexual Activity   Alcohol use: No    Alcohol/week: 0.0 standard drinks of alcohol   Drug use: No   Sexual activity: Not on file  Other Topics Concern   Not on file  Social History Narrative   Not on file   Social Determinants of Health  Financial Resource Strain: Not on file  Food Insecurity: Not on file  Transportation Needs: Not on file  Physical Activity: Not on file  Stress: Not on file  Social Connections: Not on file  Intimate Partner Violence: Not on file   Family History  Problem Relation Age of Onset   Stroke Other    Heart disease Other    BP (!) 158/54   Pulse (!) 53   Wt 80.9 kg (178 lb 6.4 oz)   SpO2 93%   BMI 27.94 kg/m   Wt Readings from Last 3 Encounters:  05/14/23 80.9 kg (178 lb 6.4 oz)  04/29/23 81.4 kg (179 lb 6.4 oz)  04/03/23 84.2 kg (185 lb 9.6 oz)   PHYSICAL EXAM: General: NAD Neck: JVP 8-9 cm with HJR, no thyromegaly or thyroid nodule.  Lungs: Clear to auscultation bilaterally with normal respiratory effort. CV: Nondisplaced PMI.  Heart irregular S1/S2, no S3/S4, 3/6 SEM RUSB.  1+ edema 1/2 to knees bilaterally.  No carotid bruit.  Normal pedal pulses.  Abdomen: Soft, nontender, no hepatosplenomegaly, no distention.  Skin: Intact without lesions or rashes.  Neurologic: Alert and oriented x 3.  Psych: Normal affect. Extremities: No clubbing or cyanosis.  HEENT:  Normal.  . ASSESSMENT & PLAN: 1. 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 RHC/LHC 7/23 showed preserved cardiac output with severe pulmonary hypertension. 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  PA pressure was still high, but is significantly lower than prior RHC. Echo today showed EF 60-65%, D-shaped interventricular septum suggestive of RV pressure/volume overload, moderate RV enlargement with mildly decreased systolic function, PASP 78 mmHg, mild mitral stenosis mean gradient 4, mild aortic stenosis mean gradient 15. Today, NYHA class II symptoms but he appears volume overloaded on exam.  Unfortunately, BUN and creatinine are significantly higher than in the past.  - Stop Entresto and spironolactone with elevated creatinine and K.  - With volume overload, I will increase torsemide to 40 qam/20 qpm.  BMET in 1 week.  - Continue Farxiga 10 mg daily.  - I will arrange for RHC to reassess filling pressures and PA pressure in the setting of worsening renal function.  Discussed risks/benefits and he agrees to procedure.  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  Some improvement on 8/23 RHC still with severe mixed PAH/PVH with PVR down to 3.4 WU.  He has never smoked and has no known lung disease.  He has been on warfarin for atrial fibrillation long-term. No known rheumatologic illness.  Not a drinker though he has some signs of cirrhosis by prior liver imaging.  V/Q scan in 7/23 negative for chronic PEs and HRCT chest without definite ILD. Serologic workup showed anti-SCL 70 neg, anti-centromere  Ab neg, RF neg, ANA with reflex positive. Suspicion for component of group 1 PH (mixed group 1 and 2).  Echo today showed moderately enlarged/mildly dysfunctional RV with PASP 78 mmHg.  - 6 minute walk done today.  - Diuretics as above. - Continue Opsumit 10 mg daily. - Continue tadalafil 40 mg daily. - Needs to try to use CPAP more often.  - RHC as above.  - Would not add Uptravi  at this time, need better control of volume.  3. Atrial fibrillation: Permanent.   - Continue warfarin.   - Bradycardic, does not need nodal blocker.  4. DM2: Continue SGLT2i 5. Carotid stenosis: CTA neck in 6/23 showed high grade bilateral ICA stenoses. He has had awake L TCAR 9/23 and R TCAR 10/23.  - He is on warfarin long-term for AF.  I think he can stop ASA in 10/23, 1 year after last TCAR.  - Continue statin, goal LDL < 70.  6. HTN: As we are stopping Entresto and spironolactone, will increase amlodipine to 10 mg daily.  7. CKD stage IIIb: Creatinine up to 3.32 with BUN 105 today.  This is significantly higher than in 11/23.  This is complicated by ongoing volume overload/RV failure.  - Diuresis as above - Stop Entresto and spironolactone.  - Refer to nephrology.  8. Aortic stenosis: Mild on 7/24 echo.  9. Mitral stenosis: Mild on 7/24 echo.   Marca Ancona  05/14/2023

## 2023-05-14 NOTE — Progress Notes (Signed)
Advanced Heart Failure Clinic Note   PCP: Robert Peri, MD Primary Cardiologist: Dr. Diona Baldwin HF Cardiologist: Dr. Shirlee Baldwin  HPI: Robert Baldwin is a 82 y.o. male with history of b/l carotid artery stenosis, DM II, HTN, HLD, permanent atrial fibrillation. He follows with Dr. Diona Baldwin in the cardiology clinic.    Saw [facility] for carotid artery stenosis in 6/23. Was noting more dyspnea with exertion and lower extremity edema for about a month. He was subsequently started on po lasix 20 mg daily.   He was seen by Dr. Diona Baldwin in the clinic 04/26/22 for evaluation of recent symptoms and pre-operative evaluation in preparation for left CEA followed by staged right CEA. Echo day of the visit showed EF 70-75%, hyperdynamic LV function, mild LVH, interventricular septum flattened in systole and diastole consistent with RV pressure and volume overload, RV moderately enlarged and moderately reduced, RVSP 120 mmHg, moderate LAE, severe RAE, moderate pericardial effusion, severe TR, dilated IVC. Patient was scheduled for outpatient R/LHC to better assess hemodynamics and further characterize pulmonary hypertension.    [facility] 7/23 showed no significant CAD, RA mean 25, PA 115/30 (58), LVEDP 22 mmHg, PCWP not obtained, Fick CO/CI 6.4/3.1, PAPi 3.4. PVR 5.6 WU. 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 He was diuresed with IV lasix. V/Q scan negative for chronic PE. ANA+. All other serologic test -. HRCT with no definite ILD. Started on tadalafil. Diuresed 24 lbs. Hospitalization complicated by right groin hematoma and received 1 unit PRBCs. Discharged home, weight 179 lbs.  Repeat RHC 8/23 showed elevated PCWP and severe mixed pulmonary arterial/venous hypertension, but PA pressures significantly lower than prior RHC. He was instructed to increase torsemide to 40 mg daily alternating with 20 mg every other day. Robert Baldwin and Robert Baldwin held with hyperkalemia.  Patient  had awake left TCAR by Dr. Chestine Baldwin in 9/23, no complications.   Follow up 10/23, volume overloaded, weight up 18 lbs. Torsemide increased to 40 bid and spiro 12.5 added back.   S/p right TCAR 10/23. Tolerated procedure well.   Echo was done today and reviewed, EF 60-65%, D-shaped interventricular septum suggestive of RV pressure/volume overload, moderate RV enlargement with mildly decreased systolic function, PASP 78 mmHg, mild mitral stenosis mean gradient 4, mild aortic stenosis mean gradient 15.   Patient returns for followup of CHF.  We have not seen him in this office since 11/23.  Today, creatinine is up to 3.32 (was 2.35 at last check in 11/23).  Weight is down 12 lbs. He only occasionally uses CPAP.  No dyspnea walking up stairs or on flat ground.  He is short of breath carrying a load. No orthopnea/PND.  No chest pain.  No lightheadedness.   ECG (personally reviewed): AF 45 bpm, rBBB  Labs (6/23): K 3.8, creatinine 1.53 Labs (7/23): K 5.5, creatinine 1.73 Labs (8/23): K 4.6, creatinine 1.5 Labs (9/23): LDL 56, K 3.9, creatinine 1.91 Labs (10/23): K 4.4, creatinine 1.94 Labs (11/23): K 5, creatinine 2.35 Labs (7/24): LDL 47, hgb 10.4, K 5.3, creatinine 3.32, BUN 102  6 minute walk (7/24): 259 m  PMH: 1. HTN 2. Hyperlipidemia 3. Type 2 diabetes 4. CKD stage 3 5. Atrial fibrillation: Permanent.  6. Carotid stenosis: CTA neck (6/23) with high grade stenosis of the bilateral internal carotid arteries.  - S/p left TCAR 9/23.  - S/p right TCAR 10/23. 7. 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  - R/LHC (7/23): no significant CAD; RA mean 25, PA 115/30 (58), LVEDP 22 mmHg,  PCWP not obtained, Fick CO/CI 6.4/3.1, PAPi 3.4. PVR 5.6 WU. - RHC (8/23): Mean RA 8, PA 85/40 mean 41, mean PCWP 22, CI 2.86, PVR 3.4 WU - Echo (7/24): EF 60-65%, D-shaped interventricular septum suggestive of RV pressure/volume overload, moderate RV enlargement with mildly decreased systolic function, PASP 78 mmHg, mild mitral stenosis mean gradient 4, mild aortic stenosis mean gradient 15.  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  See RHC and echo data above.   - V/Q scan (7/23): No evidence for chronic PE.  - High resolution CT chest (7/23): No definite ILD.  - +ANA  Current Outpatient Medications  Medication Sig Dispense Refill   aspirin EC 81 MG tablet Take 1 tablet (81 mg total) by mouth daily. Swallow whole.     atorvastatin (LIPITOR) 40 MG tablet Take 40 mg by mouth at bedtime.     cholecalciferol (VITAMIN D) 25 MCG (1000 UNIT) tablet Take 1,000 Units by mouth in the morning and at bedtime.     FARXIGA 10 MG TABS tablet TAKE 1 TABLET BY MOUTH DAILY 30 tablet 4   fish oil-omega-3 fatty acids 1000 MG capsule Take 1 g by mouth every evening.     macitentan (OPSUMIT) 10 MG tablet Take 10 mg by mouth daily.     Misc Natural Products (GLUCOS-CHONDROIT-MSM COMPLEX) TABS Take 2 tablets by mouth daily.     Multiple Vitamins-Minerals (MULTIVITAMIN WITH MINERALS) tablet Take 1 tablet by mouth in the morning.  Centrum Silver     niacin 500 MG tablet Take 500 mg by mouth in the morning.     tadalafil (CIALIS) 20 MG tablet TAKE 2 TABLETS BY MOUTH DAILY 180 tablet 3   warfarin (COUMADIN) 5 MG tablet Take 5-7.5 mg by mouth See admin instructions. Take 5 mg on Mon, Tues, Wed, Fri, and Sat Take 7.5 mg on Thurs and Sun     amLODipine (NORVASC) 10 MG tablet Take 1 tablet (10 mg total) by mouth daily. 30 tablet 11   torsemide (DEMADEX) 20 MG tablet Take 2 tablets (40 mg total) by mouth every morning AND 1 tablet (20 mg total) every evening. 180 tablet 1   No current  facility-administered medications for this encounter.   Allergies  Allergen Reactions   Other Rash    Bandaids   Social History   Socioeconomic History   Marital status: Married    Spouse name: Tammy   Number of children: Not on file   Years of education: Not on file   Highest education level: Not on file  Occupational History   Occupation: Full time    Comment: Contractor  Tobacco Use   Smoking status: Never    Passive exposure: Never   Smokeless tobacco: Never   Tobacco comments:    Never smoke 05/18/22  Vaping Use   Vaping status: Never Used  Substance and Sexual Activity   Alcohol use: No    Alcohol/week: 0.0 standard drinks of alcohol   Drug use: No   Sexual activity: Not on file  Other Topics Concern   Not on file  Social History Narrative   Not on file   Social Determinants of Health  Financial Resource Strain: Not on file  Food Insecurity: Not on file  Transportation Needs: Not on file  Physical Activity: Not on file  Stress: Not on file  Social Connections: Not on file  Intimate Partner Violence: Not on file   Family History  Problem Relation Age of Onset   Stroke Other    Heart disease Other    BP (!) 158/54   Pulse (!) 53   Wt 80.9 kg (178 lb 6.4 oz)   SpO2 93%   BMI 27.94 kg/m   Wt Readings from Last 3 Encounters:  05/14/23 80.9 kg (178 lb 6.4 oz)  04/29/23 81.4 kg (179 lb 6.4 oz)  04/03/23 84.2 kg (185 lb 9.6 oz)   PHYSICAL EXAM: General: NAD Neck: JVP 8-9 cm with HJR, no thyromegaly or thyroid nodule.  Lungs: Clear to auscultation bilaterally with normal respiratory effort. CV: Nondisplaced PMI.  Heart irregular S1/S2, no S3/S4, 3/6 SEM RUSB.  1+ edema 1/2 to knees bilaterally.  No carotid bruit.  Normal pedal pulses.  Abdomen: Soft, nontender, no hepatosplenomegaly, no distention.  Skin: Intact without lesions or rashes.  Neurologic: Alert and oriented x 3.  Psych: Normal affect. Extremities: No clubbing or cyanosis.  HEENT:  Normal.  . ASSESSMENT & PLAN: 1. 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 RHC/LHC 7/23 showed preserved cardiac output with severe pulmonary hypertension. 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  PA pressure was still high, but is significantly lower than prior RHC. Echo today showed EF 60-65%, D-shaped interventricular septum suggestive of RV pressure/volume overload, moderate RV enlargement with mildly decreased systolic function, PASP 78 mmHg, mild mitral stenosis mean gradient 4, mild aortic stenosis mean gradient 15. Today, NYHA class II symptoms but he appears volume overloaded on exam.  Unfortunately, BUN and creatinine are significantly higher than in the past.  - Stop Entresto and spironolactone with elevated creatinine and K.  - With volume overload, I will increase torsemide to 40 qam/20 qpm.  BMET in 1 week.  - Continue Farxiga 10 mg daily.  - I will arrange for RHC to reassess filling pressures and PA pressure in the setting of worsening renal function.  Discussed risks/benefits and he agrees to procedure.  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  Some improvement on 8/23 RHC still with severe mixed PAH/PVH with PVR down to 3.4 WU.  He has never smoked and has no known lung disease.  He has been on warfarin for atrial fibrillation long-term. No known rheumatologic illness.  Not a drinker though he has some signs of cirrhosis by prior liver imaging.  V/Q scan in 7/23 negative for chronic PEs and HRCT chest without definite ILD. Serologic workup showed anti-SCL 70 neg, anti-centromere  Ab neg, RF neg, ANA with reflex positive. Suspicion for component of group 1 PH (mixed group 1 and 2).  Echo today showed moderately enlarged/mildly dysfunctional RV with PASP 78 mmHg.  - 6 minute walk done today.  - Diuretics as above. - Continue Opsumit 10 mg daily. - Continue tadalafil 40 mg daily. - Needs to try to use CPAP more often.  - RHC as above.  - Would not add Uptravi  at this time, need better control of volume.  3. Atrial fibrillation: Permanent.   - Continue warfarin.   - Bradycardic, does not need nodal blocker.  4. DM2: Continue SGLT2i 5. Carotid stenosis: CTA neck in 6/23 showed high grade bilateral ICA stenoses. He has had awake L TCAR 9/23 and R TCAR 10/23.  - He is on warfarin long-term for AF.  I think he can stop ASA in 10/23, 1 year after last TCAR.  - Continue statin, goal LDL < 70.  6. HTN: As we are stopping Entresto and spironolactone, will increase amlodipine to 10 mg daily.  7. CKD stage IIIb: Creatinine up to 3.32 with BUN 105 today.  This is significantly higher than in 11/23.  This is complicated by ongoing volume overload/RV failure.  - Diuresis as above - Stop Entresto and spironolactone.  - Refer to nephrology.  8. Aortic stenosis: Mild on 7/24 echo.  9. Mitral stenosis: Mild on 7/24 echo.   Marca Ancona  05/14/2023

## 2023-05-14 NOTE — Telephone Encounter (Addendum)
Pt aware, agreeable, and verbalized understanding  Labs already ordered, referral sent and med list updated   ----- Message from Marca Ancona sent at 05/14/2023  3:00 PM EDT ----- Creatinine is considerably worse than it was when we last saw him.  He was also volume overloaded today.  Would keep him on the higher dose of torsemide as ordered today, but would also stop spironolactone and Entresto.  Make sure he increases amlodipine to 10 mg daily.  BMET next week.  RHC and followup as planned.

## 2023-05-14 NOTE — Patient Instructions (Addendum)
INCREASE Amlodipine to 10 mg daily.  CHANGE Torsemide to 40 mg in the morning and 20 mg in the evening.  PLEASE KEEP YOUR INR CLOSE TO 2  Labs done today, your results will be available in MyChart, we will contact you for abnormal readings.  You are scheduled for a Cardiac Catheterization on Tuesday, July 30 with Dr. Marca Ancona.  1. Please arrive at the [facility] (Main Entrance A) at [facility]: [address] [place], [place] [address] at 10:00 AM (This time is 2 hour(s) before your procedure to ensure your preparation). Free valet parking service is available. You will check in at ADMITTING. The support person will be asked to wait in the waiting room.  It is OK to have someone drop you off and come back when you are ready to be discharged.    Special note: Every effort is made to have your procedure done on time. Please understand that emergencies sometimes delay scheduled procedures.  2. Diet: Do not eat solid foods after midnight.  The patient may have clear liquids until 5am upon the day of the procedure.  3. Medication instructions in preparation for your procedure:   Contrast Allergy: No  HOLD YOUR TORSEMIDE AND FARXIGA THE DAY OF YOUR PROCEDURE.  On the morning of your procedure, take any morning medicines NOT listed above.  You may use sips of water.  5. Plan to go home the same day, you will only stay overnight if medically necessary. 6. Bring a current list of your medications and current insurance cards. 7. You MUST have a responsible person to drive you home. 8. Someone MUST be with you the first 24 hours after you arrive home or your discharge will be delayed. 9. Please wear clothes that are easy to get on and off and wear slip-on shoes.  Your physician recommends that you schedule a follow-up appointment in: 6 WEEKS  If you have any questions or concerns before your next appointment please send [place] a message through [place] or call our office at  [phone].    TO LEAVE A MESSAGE FOR THE NURSE SELECT OPTION 2, PLEASE LEAVE A MESSAGE INCLUDING: YOUR NAME DATE OF BIRTH CALL BACK NUMBER REASON FOR CALL**this is important as we prioritize the call backs  YOU WILL RECEIVE A CALL BACK THE SAME DAY AS LONG AS YOU CALL BEFORE 4:00 PM  At the [facility], you and your health needs are our priority. As part of our continuing mission to provide you with exceptional heart care, we have created designated Provider Care Teams. These Care Teams include your primary Cardiologist (physician) and Advanced Practice Providers (APPs- Physician Assistants and Nurse Practitioners) who all work together to provide you with the care you need, when you need it.   You may see any of the following providers on your designated Care Team at your next follow up: Dr Arvilla Meres Dr Marca Ancona Dr. Marcos Eke, NP Robbie Lis, [place] [facility] [place], [place] Brynda Peon, NP Karle Plumber, PharmD   Please be sure to bring in all your medications bottles to every appointment.    Thank you for choosing [facility]

## 2023-05-14 NOTE — Progress Notes (Signed)
  Echocardiogram 2D Echocardiogram has been performed.  Robert Baldwin 05/14/2023, 9:50 AM

## 2023-05-15 ENCOUNTER — Telehealth (HOSPITAL_COMMUNITY): Payer: Self-pay

## 2023-05-15 NOTE — Telephone Encounter (Signed)
Referral sent to [facility] on 05/15/23.

## 2023-05-16 ENCOUNTER — Telehealth (HOSPITAL_COMMUNITY): Payer: Self-pay | Admitting: Vascular Surgery

## 2023-05-16 DIAGNOSIS — I4891 Unspecified atrial fibrillation: Secondary | ICD-10-CM | POA: Diagnosis not present

## 2023-05-16 DIAGNOSIS — I700 Atherosclerosis of aorta: Secondary | ICD-10-CM | POA: Diagnosis not present

## 2023-05-16 DIAGNOSIS — Z0000 Encounter for general adult medical examination without abnormal findings: Secondary | ICD-10-CM | POA: Diagnosis not present

## 2023-05-16 DIAGNOSIS — Z7189 Other specified counseling: Secondary | ICD-10-CM | POA: Diagnosis not present

## 2023-05-16 DIAGNOSIS — Z1339 Encounter for screening examination for other mental health and behavioral disorders: Secondary | ICD-10-CM | POA: Diagnosis not present

## 2023-05-16 DIAGNOSIS — I5032 Chronic diastolic (congestive) heart failure: Secondary | ICD-10-CM | POA: Diagnosis not present

## 2023-05-16 DIAGNOSIS — Z1331 Encounter for screening for depression: Secondary | ICD-10-CM | POA: Diagnosis not present

## 2023-05-16 DIAGNOSIS — Z299 Encounter for prophylactic measures, unspecified: Secondary | ICD-10-CM | POA: Diagnosis not present

## 2023-05-16 DIAGNOSIS — I739 Peripheral vascular disease, unspecified: Secondary | ICD-10-CM | POA: Diagnosis not present

## 2023-05-16 NOTE — Telephone Encounter (Signed)
Pts wife called to cancel cath -reports seen by PCP 7/24 for what was thought to be a gout flare. PCP is treating for an infection. Per PCP with cardiac history and current infection should post pone procedure.  Abx started 7/25   Message to provider as FYI  Procedure cancelled

## 2023-05-16 NOTE — Telephone Encounter (Signed)
With his renal dysfunction and volume overload, I think he needs to get the RHC done at some point soon. I do not think that the infection versus gout will make much difference for a RHC (low risk procedure).  I am comfortable with him having the procedure done as scheduled on 7/30.  If he really does not want to do this (don't think there is really a medical reason to delay - don't agree with PCP - but can delay if he prefers), can push back a week or 2 (but I am concerned, as above, about combination of renal dysfunction and CHF and want to get some hemodynamic numbers for guidance).

## 2023-05-16 NOTE — Telephone Encounter (Signed)
Pt wife lvm on scheduling line wanting to cancel cath 7/30

## 2023-05-17 NOTE — Telephone Encounter (Addendum)
Pt aware via wife.  Will keep procedure on 7/30

## 2023-05-18 ENCOUNTER — Other Ambulatory Visit (HOSPITAL_COMMUNITY): Payer: Self-pay | Admitting: Cardiology

## 2023-05-18 DIAGNOSIS — I270 Primary pulmonary hypertension: Secondary | ICD-10-CM

## 2023-05-20 ENCOUNTER — Telehealth (HOSPITAL_COMMUNITY): Payer: Self-pay

## 2023-05-20 ENCOUNTER — Telehealth (HOSPITAL_COMMUNITY): Payer: Self-pay | Admitting: *Deleted

## 2023-05-20 ENCOUNTER — Other Ambulatory Visit (HOSPITAL_COMMUNITY): Payer: Self-pay

## 2023-05-20 DIAGNOSIS — G4733 Obstructive sleep apnea (adult) (pediatric): Secondary | ICD-10-CM | POA: Diagnosis not present

## 2023-05-20 NOTE — Telephone Encounter (Signed)
Spoke to patient's wife regarding procedure scheduled for tomorrow. Aware for nothing to eat or drink after midnight.Aware of holding farxiga and torsemide am of procedure.Has transportation to and from procedure.

## 2023-05-21 ENCOUNTER — Encounter (HOSPITAL_COMMUNITY)
Admission: RE | Disposition: A | Discharge status: Home / Self Care | Payer: Self-pay | Source: Ambulatory Visit | Attending: Cardiology

## 2023-05-21 ENCOUNTER — Ambulatory Visit (HOSPITAL_COMMUNITY)
Admission: RE | Admit: 2023-05-21 | Discharge: 2023-05-21 | Disposition: A | Discharge status: Home / Self Care | Payer: Medicare PPO | Source: Ambulatory Visit | Attending: Cardiology | Admitting: Cardiology

## 2023-05-21 ENCOUNTER — Other Ambulatory Visit: Payer: Self-pay

## 2023-05-21 DIAGNOSIS — I350 Nonrheumatic aortic (valve) stenosis: Secondary | ICD-10-CM | POA: Insufficient documentation

## 2023-05-21 DIAGNOSIS — N1832 Chronic kidney disease, stage 3b: Secondary | ICD-10-CM | POA: Insufficient documentation

## 2023-05-21 DIAGNOSIS — Z79899 Other long term (current) drug therapy: Secondary | ICD-10-CM | POA: Diagnosis not present

## 2023-05-21 DIAGNOSIS — I342 Nonrheumatic mitral (valve) stenosis: Secondary | ICD-10-CM | POA: Diagnosis not present

## 2023-05-21 DIAGNOSIS — I130 Hypertensive heart and chronic kidney disease with heart failure and stage 1 through stage 4 chronic kidney disease, or unspecified chronic kidney disease: Secondary | ICD-10-CM | POA: Insufficient documentation

## 2023-05-21 DIAGNOSIS — I6523 Occlusion and stenosis of bilateral carotid arteries: Secondary | ICD-10-CM | POA: Diagnosis not present

## 2023-05-21 DIAGNOSIS — I4821 Permanent atrial fibrillation: Secondary | ICD-10-CM | POA: Diagnosis not present

## 2023-05-21 DIAGNOSIS — I5032 Chronic diastolic (congestive) heart failure: Secondary | ICD-10-CM | POA: Insufficient documentation

## 2023-05-21 DIAGNOSIS — I509 Heart failure, unspecified: Secondary | ICD-10-CM

## 2023-05-21 DIAGNOSIS — I272 Pulmonary hypertension, unspecified: Secondary | ICD-10-CM | POA: Diagnosis not present

## 2023-05-21 DIAGNOSIS — Z7901 Long term (current) use of anticoagulants: Secondary | ICD-10-CM | POA: Diagnosis not present

## 2023-05-21 DIAGNOSIS — E1122 Type 2 diabetes mellitus with diabetic chronic kidney disease: Secondary | ICD-10-CM | POA: Insufficient documentation

## 2023-05-21 HISTORY — PX: RIGHT HEART CATH: CATH118263

## 2023-05-21 LAB — POCT ACTIVATED CLOTTING TIME
Activated Clotting Time: 195 seconds
Activated Clotting Time: 195 seconds

## 2023-05-21 LAB — POCT I-STAT 7, (LYTES, BLD GAS, ICA,H+H)
Acid-Base Excess: 1 mmol/L (ref 0.0–2.0)
Acid-Base Excess: 1 mmol/L (ref 0.0–2.0)
Bicarbonate: 25.7 mmol/L (ref 20.0–28.0)
Bicarbonate: 26.2 mmol/L (ref 20.0–28.0)
Calcium, Ion: 1.2 mmol/L (ref 1.15–1.40)
Calcium, Ion: 1.21 mmol/L (ref 1.15–1.40)
HCT: 31 % — ABNORMAL LOW (ref 39.0–52.0)
HCT: 31 % — ABNORMAL LOW (ref 39.0–52.0)
Hemoglobin: 10.5 g/dL — ABNORMAL LOW (ref 13.0–17.0)
Hemoglobin: 10.5 g/dL — ABNORMAL LOW (ref 13.0–17.0)
O2 Saturation: 73 %
O2 Saturation: 75 %
Potassium: 3.8 mmol/L (ref 3.5–5.1)
Potassium: 3.9 mmol/L (ref 3.5–5.1)
Sodium: 141 mmol/L (ref 135–145)
Sodium: 141 mmol/L (ref 135–145)
TCO2: 27 mmol/L (ref 22–32)
TCO2: 27 mmol/L (ref 22–32)
pCO2 arterial: 40.9 mmHg (ref 32–48)
pCO2 arterial: 41.4 mmHg (ref 32–48)
pH, Arterial: 7.402 (ref 7.35–7.45)
pH, Arterial: 7.415 (ref 7.35–7.45)
pO2, Arterial: 39 mmHg — CL (ref 83–108)
pO2, Arterial: 40 mmHg — CL (ref 83–108)

## 2023-05-21 LAB — BASIC METABOLIC PANEL
Anion gap: 13 (ref 5–15)
BUN: 93 mg/dL — ABNORMAL HIGH (ref 8–23)
CO2: 24 mmol/L (ref 22–32)
Calcium: 9.3 mg/dL (ref 8.9–10.3)
Chloride: 102 mmol/L (ref 98–111)
Creatinine, Ser: 2.24 mg/dL — ABNORMAL HIGH (ref 0.61–1.24)
GFR, Estimated: 29 mL/min — ABNORMAL LOW (ref >60)
Glucose, Bld: 154 mg/dL — ABNORMAL HIGH (ref 70–99)
Potassium: 3.8 mmol/L (ref 3.5–5.1)
Sodium: 139 mmol/L (ref 135–145)

## 2023-05-21 LAB — PROTIME-INR
INR: 3.3 — ABNORMAL HIGH (ref 0.8–1.2)
Prothrombin Time: 34.1 seconds — ABNORMAL HIGH (ref 11.4–15.2)

## 2023-05-21 LAB — GLUCOSE, CAPILLARY
Glucose-Capillary: 135 mg/dL — ABNORMAL HIGH (ref 70–99)
Glucose-Capillary: 142 mg/dL — ABNORMAL HIGH (ref 70–99)

## 2023-05-21 SURGERY — RIGHT HEART CATH
Anesthesia: LOCAL

## 2023-05-21 MED ORDER — HEPARIN (PORCINE) IN NACL 1000-0.9 UT/500ML-% IV SOLN
INTRAVENOUS | Status: DC | PRN
  Administered 2023-05-21: 500 mL

## 2023-05-21 MED ORDER — TORSEMIDE 20 MG PO TABS: 40.0000 mg | ORAL_TABLET | Freq: Two times a day (BID) | ORAL | 1 refills | Status: DC

## 2023-05-21 MED ORDER — LIDOCAINE HCL (PF) 1 % IJ SOLN
INTRAMUSCULAR | Status: DC | PRN
  Administered 2023-05-21: 2 mL via INTRADERMAL

## 2023-05-21 MED ORDER — HYDRALAZINE HCL 20 MG/ML IJ SOLN: 10.0000 mg | INTRAMUSCULAR | Status: DC | PRN

## 2023-05-21 MED ORDER — SODIUM CHLORIDE 0.9 % IV SOLN: INTRAVENOUS | Status: DC

## 2023-05-21 MED ORDER — ACETAMINOPHEN 325 MG PO TABS: 650.0000 mg | ORAL_TABLET | ORAL | Status: DC | PRN

## 2023-05-21 MED ORDER — SODIUM CHLORIDE 0.9 % IV SOLN: 250.0000 mL | INTRAVENOUS | Status: DC | PRN

## 2023-05-21 MED ORDER — ONDANSETRON HCL 4 MG/2ML IJ SOLN: 4.0000 mg | Freq: Four times a day (QID) | INTRAMUSCULAR | Status: DC | PRN

## 2023-05-21 MED ORDER — LABETALOL HCL 5 MG/ML IV SOLN: 10.0000 mg | INTRAVENOUS | Status: DC | PRN

## 2023-05-21 MED ORDER — SODIUM CHLORIDE 0.9% FLUSH: 3.0000 mL | Freq: Two times a day (BID) | INTRAVENOUS | Status: DC

## 2023-05-21 MED ORDER — SODIUM CHLORIDE 0.9% FLUSH: 3.0000 mL | INTRAVENOUS | Status: DC | PRN

## 2023-05-21 MED ORDER — LIDOCAINE HCL (PF) 1 % IJ SOLN
INTRAMUSCULAR | Status: AC
  Filled 2023-05-21: qty 30

## 2023-05-21 SURGICAL SUPPLY — 6 items
CATH BALLN WEDGE 5F 110CM (CATHETERS) IMPLANT
GUIDEWIRE .025 260CM (WIRE) IMPLANT
KIT HEART LEFT (KITS) ×1 IMPLANT
PACK CARDIAC CATHETERIZATION (CUSTOM PROCEDURE TRAY) ×1 IMPLANT
SHEATH GLIDE SLENDER 4/5FR (SHEATH) IMPLANT
TRANSDUCER W/STOPCOCK (MISCELLANEOUS) ×1 IMPLANT

## 2023-05-21 NOTE — Discharge Instructions (Signed)

## 2023-05-21 NOTE — Interval H&P Note (Signed)
History and Physical Interval Note:  05/21/2023 12:49 PM  Robert Baldwin  has presented today for surgery, with the diagnosis of hp.  The various methods of treatment have been discussed with the patient and family. After consideration of risks, benefits and other options for treatment, the patient has consented to  Procedure(s): RIGHT HEART CATH (N/A) as a surgical intervention.  The patient's history has been reviewed, patient examined, no change in status, stable for surgery.  I have reviewed the patient's chart and labs.  Questions were answered to the patient's satisfaction.     Naylin Burkle [employer]

## 2023-05-22 ENCOUNTER — Encounter (HOSPITAL_COMMUNITY): Payer: Self-pay | Admitting: Cardiology

## 2023-05-23 DIAGNOSIS — Z299 Encounter for prophylactic measures, unspecified: Secondary | ICD-10-CM | POA: Diagnosis not present

## 2023-05-23 DIAGNOSIS — L0390 Cellulitis, unspecified: Secondary | ICD-10-CM | POA: Diagnosis not present

## 2023-05-23 DIAGNOSIS — I10 Essential (primary) hypertension: Secondary | ICD-10-CM | POA: Diagnosis not present

## 2023-05-23 DIAGNOSIS — I5032 Chronic diastolic (congestive) heart failure: Secondary | ICD-10-CM | POA: Diagnosis not present

## 2023-06-05 DIAGNOSIS — Z299 Encounter for prophylactic measures, unspecified: Secondary | ICD-10-CM | POA: Diagnosis not present

## 2023-06-05 DIAGNOSIS — I10 Essential (primary) hypertension: Secondary | ICD-10-CM | POA: Diagnosis not present

## 2023-06-05 DIAGNOSIS — K5900 Constipation, unspecified: Secondary | ICD-10-CM | POA: Diagnosis not present

## 2023-06-07 DIAGNOSIS — I10 Essential (primary) hypertension: Secondary | ICD-10-CM | POA: Diagnosis not present

## 2023-06-07 DIAGNOSIS — I4891 Unspecified atrial fibrillation: Secondary | ICD-10-CM | POA: Diagnosis not present

## 2023-06-07 DIAGNOSIS — I5032 Chronic diastolic (congestive) heart failure: Secondary | ICD-10-CM | POA: Diagnosis not present

## 2023-06-07 DIAGNOSIS — Z299 Encounter for prophylactic measures, unspecified: Secondary | ICD-10-CM | POA: Diagnosis not present

## 2023-06-08 DIAGNOSIS — G4733 Obstructive sleep apnea (adult) (pediatric): Secondary | ICD-10-CM | POA: Diagnosis not present

## 2023-06-11 ENCOUNTER — Encounter (HOSPITAL_COMMUNITY): Payer: Medicare PPO

## 2023-06-11 ENCOUNTER — Ambulatory Visit: Payer: Medicare PPO

## 2023-06-18 ENCOUNTER — Ambulatory Visit: Payer: Medicare PPO

## 2023-06-18 ENCOUNTER — Encounter (HOSPITAL_COMMUNITY): Payer: Medicare PPO

## 2023-06-18 ENCOUNTER — Other Ambulatory Visit (HOSPITAL_COMMUNITY): Payer: Self-pay | Admitting: Family Medicine

## 2023-06-20 ENCOUNTER — Other Ambulatory Visit (HOSPITAL_COMMUNITY): Payer: Self-pay | Admitting: Family Medicine

## 2023-06-21 NOTE — Progress Notes (Signed)
Advanced Heart Failure Clinic Note   PCP: Robert Peri, MD Primary Cardiologist: Dr. Diona Baldwin HF Cardiologist: Dr. Shirlee Baldwin  HPI: Robert Baldwin is a 82 y.o. male with history of b/l carotid artery stenosis, DM II, HTN, HLD, permanent atrial fibrillation. He follows with Dr. Diona Baldwin in the cardiology clinic.    Saw Robert Baldwin for carotid artery stenosis in 6/23. Was noting more dyspnea with exertion and lower extremity edema for about a month. He was subsequently started on po lasix 20 mg daily.   He was seen by Dr. Diona Baldwin in the clinic 04/26/22 for evaluation of recent symptoms and pre-operative evaluation in preparation for left CEA followed by staged right CEA. Echo day of the visit showed EF 70-75%, hyperdynamic LV function, mild LVH, interventricular septum flattened in systole and diastole consistent with RV pressure and volume overload, RV moderately enlarged and moderately reduced, RVSP 120 mmHg, moderate LAE, severe RAE, moderate pericardial effusion, severe TR, dilated IVC. Patient was scheduled for outpatient Robert Baldwin to better assess hemodynamics and further characterize pulmonary hypertension.    Robert Baldwin 7/23 showed no significant CAD, RA mean 25, PA 115/30 (58), LVEDP 22 mmHg, PCWP not obtained, Fick CO/CI 6.4/3.1, PAPi 3.4. PVR 5.6 WU. 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 He was diuresed with IV lasix. V/Q scan negative for chronic PE. ANA+. All other serologic test -. HRCT with no definite ILD. Started on tadalafil. Diuresed 24 lbs. Hospitalization complicated by right groin hematoma and received 1 unit PRBCs. Discharged home, weight 179 lbs.  Repeat RHC 8/23 showed elevated PCWP and severe mixed pulmonary arterial/venous hypertension, but PA pressures significantly lower than prior RHC. He was instructed to increase torsemide to 40 mg daily alternating with 20 mg every other day. Robert Baldwin and Robert Baldwin held with hyperkalemia.  Patient  had awake left Robert Baldwin by Dr. Chestine Baldwin in 9/23, no complications.   Follow up 10/23, volume overloaded, weight up 18 lbs. Torsemide increased to 40 bid and spiro 12.5 added back.   S/p right Robert Baldwin 10/23. Tolerated procedure well.   Echo was done today and reviewed, EF 60-65%, D-shaped interventricular septum suggestive of RV pressure/volume overload, moderate RV enlargement with mildly decreased systolic function, PASP 78 mmHg, mild mitral stenosis mean gradient 4, mild aortic stenosis mean gradient 15.   Patient returns for followup of CHF.  We have not seen him in this office since 11/23.  Today, creatinine is up to 3.32 (was 2.35 at last check in 11/23).  Weight is down 12 lbs. He only occasionally uses Robert Baldwin.  No dyspnea walking up stairs or on flat ground.  He is short of breath carrying a load. No orthopnea/PND.  No chest pain.  No lightheadedness.   ECG (personally reviewed): AF 45 bpm, rBBB  Labs (6/23): K 3.8, creatinine 1.53 Labs (7/23): K 5.5, creatinine 1.73 Labs (8/23): K 4.6, creatinine 1.5 Labs (9/23): LDL 56, K 3.9, creatinine 1.61 Labs (10/23): K 4.4, creatinine 1.94 Labs (11/23): K 5, creatinine 2.35 Labs (7/24): LDL 47, hgb 10.4, K 5.3, creatinine 3.32, BUN 102  6 minute walk (7/24): 259 m  PMH: 1. HTN 2. Hyperlipidemia 3. Type 2 diabetes 4. CKD stage 3 5. Atrial fibrillation: Permanent.  6. Carotid stenosis: CTA neck (6/23) with high grade stenosis of the bilateral internal carotid arteries.  - S/p left Robert Baldwin 9/23.  - S/p right Robert Baldwin 10/23. 7. 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  - Robert Baldwin (7/23): no significant CAD; RA mean 25, PA 115/30 (58), LVEDP 22 mmHg,  PCWP not obtained, Fick CO/CI 6.4/3.1, PAPi 3.4. PVR 5.6 WU. - RHC (8/23): Mean RA 8, PA 85/40 mean 41, mean PCWP 22, CI 2.86, PVR 3.4 WU - Echo (7/24): EF 60-65%, D-shaped interventricular septum suggestive of RV pressure/volume overload, moderate RV enlargement with mildly decreased systolic function, PASP 78 mmHg, mild mitral stenosis mean gradient 4, mild aortic stenosis mean gradient 15.  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  See RHC and echo data above.   - V/Q scan (7/23): No evidence for chronic PE.  - High resolution CT chest (7/23): No definite ILD.  - +ANA  Current Outpatient Medications  Medication Sig Dispense Refill   amLODipine (NORVASC) 10 MG tablet Take 1 tablet (10 mg total) by mouth daily. 30 tablet 11   aspirin EC 81 MG tablet Take 1 tablet (81 mg total) by mouth daily. Swallow whole.     atorvastatin (LIPITOR) 40 MG tablet Take 40 mg by mouth at bedtime.     cholecalciferol (VITAMIN D) 25 MCG (1000 UNIT) tablet Take 1,000 Units by mouth in the morning and at bedtime.     FARXIGA 10 MG TABS tablet TAKE 1 TABLET BY MOUTH DAILY 30 tablet 4   fish oil-omega-3 fatty acids 1000 MG capsule Take 1 g by mouth every evening.     macitentan (OPSUMIT) 10 MG tablet TAKE 1 TABLET BY MOUTH DAILY 90 tablet 3   Misc Natural Products (GLUCOS-CHONDROIT-MSM COMPLEX) TABS Take 2 tablets by mouth daily.     Multiple Vitamins-Minerals (MULTIVITAMIN WITH MINERALS) tablet Take 1 tablet by mouth in the morning.  Centrum Silver     niacin 500 MG tablet Take 500 mg by mouth in the morning.     tadalafil (CIALIS) 20 MG tablet TAKE 2 TABLETS BY MOUTH DAILY 180 tablet 3   torsemide (DEMADEX) 20 MG tablet Take 2 tablets (40 mg total) by mouth 2 (two) times daily. 180 tablet 1   warfarin (COUMADIN) 5 MG tablet Take 5-7.5 mg by mouth See admin instructions. Take 5 mg on Mon, Tues, Wed, Fri, and Sat Take 7.5 mg on Thurs and Sun     No current facility-administered medications for  this visit.   Allergies  Allergen Reactions   Other Rash    Bandaids   Social History   Socioeconomic History   Marital status: Married    Spouse name: Tammy   Number of children: Not on file   Years of education: Not on file   Highest education level: Not on file  Occupational History   Occupation: Full time    Comment: Contractor  Tobacco Use   Smoking status: Never    Passive exposure: Never   Smokeless tobacco: Never   Tobacco comments:    Never smoke 05/18/22  Vaping Use   Vaping status: Never Used  Substance and Sexual Activity   Alcohol use: No    Alcohol/week: 0.0 standard drinks of alcohol   Drug use: No   Sexual activity: Not on file  Other Topics Concern   Not on file  Social History Narrative   Not on file   Social Determinants of Health   Financial Resource Strain: Not on  file  Food Insecurity: Not on file  Transportation Needs: Not on file  Physical Activity: Not on file  Stress: Not on file  Social Connections: Not on file  Intimate Partner Violence: Not on file   Family History  Problem Relation Age of Onset   Stroke Other    Heart disease Other    There were no vitals taken for this visit.  Wt Readings from Last 3 Encounters:  05/21/23 75.3 kg (166 lb)  05/14/23 80.9 kg (178 lb 6.4 oz)  04/29/23 81.4 kg (179 lb 6.4 oz)   PHYSICAL EXAM: General: NAD Neck: JVP 8-9 cm with HJR, no thyromegaly or thyroid nodule.  Lungs: Clear to auscultation bilaterally with normal respiratory effort. CV: Nondisplaced PMI.  Heart irregular S1/S2, no S3/S4, 3/6 SEM RUSB.  1+ edema 1/2 to knees bilaterally.  No carotid bruit.  Normal pedal pulses.  Abdomen: Soft, nontender, no hepatosplenomegaly, no distention.  Skin: Intact without lesions or rashes.  Neurologic: Alert and oriented x 3.  Psych: Normal affect. Extremities: No clubbing or cyanosis.  HEENT: Normal.  . ASSESSMENT & PLAN: 1. 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 RHC/LHC 7/23 showed preserved cardiac output with severe pulmonary hypertension. 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  PA pressure was still high, but is significantly lower than prior RHC. Echo today showed EF 60-65%, D-shaped interventricular septum suggestive of RV pressure/volume overload, moderate RV enlargement with mildly decreased systolic function, PASP 78 mmHg, mild mitral stenosis mean gradient 4, mild aortic stenosis mean gradient 15. Today, NYHA class II symptoms but he appears volume overloaded on exam.  Unfortunately, BUN and creatinine are significantly higher than in the past.  - Stop Entresto and spironolactone with elevated creatinine and K.  - With volume overload, I will increase torsemide to 40 qam/20 qpm.  BMET in 1 week.  - Continue Farxiga 10 mg daily.  - I will arrange for RHC to reassess filling pressures and PA pressure in the setting of worsening renal function.  Discussed risks/benefits and he agrees to procedure.  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  Some improvement on 8/23 RHC still with severe mixed PAH/PVH with PVR down to 3.4 WU.  He has never smoked and has no known lung disease.  He has been on warfarin for atrial fibrillation long-term. No known rheumatologic illness.  Not a drinker though he has some signs of cirrhosis by prior liver imaging.  V/Q scan in 7/23 negative for chronic PEs and HRCT chest without definite ILD. Serologic workup showed anti-SCL 70 neg, anti-centromere Ab neg, RF neg, ANA with reflex positive. Suspicion for component of group 1 PH (mixed group 1  and 2).  Echo today showed moderately enlarged/mildly dysfunctional RV with PASP 78 mmHg.  - 6 minute walk done today.  - Diuretics as above. - Continue Opsumit 10 mg daily. - Continue tadalafil 40 mg daily. - Needs to try to use Robert Baldwin more often.  - RHC as above.  - Would not add Uptravi at this time, need better control of volume.  3. Atrial fibrillation: Permanent.   - Continue warfarin.   - Bradycardic, does not need nodal  blocker.  4. DM2: Continue SGLT2i 5. Carotid stenosis: CTA neck in 6/23 showed high grade bilateral ICA stenoses. He has had awake L Robert Baldwin 9/23 and R Robert Baldwin 10/23.  - He is on warfarin long-term for AF.  I think he can stop ASA in 10/23, 1 year after last Robert Baldwin.  - Continue statin, goal LDL < 70.  6. HTN: As we are stopping Entresto and spironolactone, will increase amlodipine to 10 mg daily.  7. CKD stage IIIb: Creatinine up to 3.32 with BUN 105 today.  This is significantly higher than in 11/23.  This is complicated by ongoing volume overload/RV failure.  - Diuresis as above - Stop Entresto and spironolactone.  - Refer to nephrology.  8. Aortic stenosis: Mild on 7/24 echo.  9. Mitral stenosis: Mild on 7/24 echo.   Anderson [facility] [place]  06/21/2023

## 2023-06-25 ENCOUNTER — Ambulatory Visit (HOSPITAL_COMMUNITY)
Admission: RE | Admit: 2023-06-25 | Discharge: 2023-06-25 | Disposition: A | Discharge status: Home / Self Care | Payer: Medicare PPO | Source: Ambulatory Visit | Attending: Cardiology | Admitting: Cardiology

## 2023-06-25 ENCOUNTER — Encounter (HOSPITAL_COMMUNITY): Payer: Self-pay

## 2023-06-25 VITALS — BP 162/54 | HR 41 | Wt 177.4 lb

## 2023-06-25 DIAGNOSIS — I5032 Chronic diastolic (congestive) heart failure: Secondary | ICD-10-CM

## 2023-06-25 DIAGNOSIS — I050 Rheumatic mitral stenosis: Secondary | ICD-10-CM

## 2023-06-25 DIAGNOSIS — I4821 Permanent atrial fibrillation: Secondary | ICD-10-CM | POA: Diagnosis not present

## 2023-06-25 DIAGNOSIS — I6523 Occlusion and stenosis of bilateral carotid arteries: Secondary | ICD-10-CM | POA: Diagnosis not present

## 2023-06-25 DIAGNOSIS — Z79899 Other long term (current) drug therapy: Secondary | ICD-10-CM | POA: Diagnosis not present

## 2023-06-25 DIAGNOSIS — Z7901 Long term (current) use of anticoagulants: Secondary | ICD-10-CM | POA: Diagnosis not present

## 2023-06-25 DIAGNOSIS — I272 Pulmonary hypertension, unspecified: Secondary | ICD-10-CM

## 2023-06-25 DIAGNOSIS — I080 Rheumatic disorders of both mitral and aortic valves: Secondary | ICD-10-CM | POA: Diagnosis not present

## 2023-06-25 DIAGNOSIS — Z7984 Long term (current) use of oral hypoglycemic drugs: Secondary | ICD-10-CM | POA: Diagnosis not present

## 2023-06-25 DIAGNOSIS — E1122 Type 2 diabetes mellitus with diabetic chronic kidney disease: Secondary | ICD-10-CM | POA: Insufficient documentation

## 2023-06-25 DIAGNOSIS — I2721 Secondary pulmonary arterial hypertension: Secondary | ICD-10-CM | POA: Diagnosis not present

## 2023-06-25 DIAGNOSIS — I350 Nonrheumatic aortic (valve) stenosis: Secondary | ICD-10-CM

## 2023-06-25 DIAGNOSIS — I10 Essential (primary) hypertension: Secondary | ICD-10-CM | POA: Diagnosis not present

## 2023-06-25 DIAGNOSIS — E1159 Type 2 diabetes mellitus with other circulatory complications: Secondary | ICD-10-CM

## 2023-06-25 DIAGNOSIS — I4891 Unspecified atrial fibrillation: Secondary | ICD-10-CM

## 2023-06-25 DIAGNOSIS — I130 Hypertensive heart and chronic kidney disease with heart failure and stage 1 through stage 4 chronic kidney disease, or unspecified chronic kidney disease: Secondary | ICD-10-CM | POA: Diagnosis not present

## 2023-06-25 DIAGNOSIS — N1832 Chronic kidney disease, stage 3b: Secondary | ICD-10-CM

## 2023-06-25 LAB — BASIC METABOLIC PANEL
Anion gap: 16 — ABNORMAL HIGH (ref 5–15)
BUN: 59 mg/dL — ABNORMAL HIGH (ref 8–23)
CO2: 25 mmol/L (ref 22–32)
Calcium: 9.1 mg/dL (ref 8.9–10.3)
Chloride: 98 mmol/L (ref 98–111)
Creatinine, Ser: 2.15 mg/dL — ABNORMAL HIGH (ref 0.61–1.24)
GFR, Estimated: 30 mL/min — ABNORMAL LOW (ref >60)
Glucose, Bld: 135 mg/dL — ABNORMAL HIGH (ref 70–99)
Potassium: 4 mmol/L (ref 3.5–5.1)
Sodium: 139 mmol/L (ref 135–145)

## 2023-06-25 LAB — BRAIN NATRIURETIC PEPTIDE: B Natriuretic Peptide: 729.6 pg/mL — ABNORMAL HIGH (ref 0.0–100.0)

## 2023-06-25 NOTE — Progress Notes (Signed)
6 Min Walk Test Completed  Pt ambulated 632ft (195.083m) O2 Sat ranged 88-90 on None L oxygen HR ranged 50-61

## 2023-06-25 NOTE — Patient Instructions (Signed)
Medication Changes:  TAKE 1 WHOLE TABLET OF AMLODIPINE (10MG ) DAILY DO NOT SPLIT THESE IN HALF  Lab Work:  Labs done today, your results will be available in MyChart, we will contact you for abnormal readings.   Follow-Up in: IN 4 WEEKS AS SCHEDULED WITH APP   AND THEN 4 MONTHS AS SCHEDULED WITH DR. Shirlee Latch   At the [facility], you and your health needs are our priority. We have a designated team specialized in the treatment of Heart Failure. This Care Team includes your primary Heart Failure Specialized Cardiologist (physician), Advanced Practice Providers (APPs- Physician Assistants and Nurse Practitioners), and Pharmacist who all work together to provide you with the care you need, when you need it.   You may see any of the following providers on your designated Care Team at your next follow up:  Dr. Arvilla Meres Dr. Marca Ancona Dr. Marcos Eke, NP Robbie Lis, [place] [facility] [place], [place] Brynda Peon, NP Karle Plumber, PharmD   Please be sure to bring in all your medications bottles to every appointment.   Need to Contact us:  If you have any questions or concerns before your next appointment please send [place] a message through [place] or call our office at [phone].    TO LEAVE A MESSAGE FOR THE NURSE SELECT OPTION 2, PLEASE LEAVE A MESSAGE INCLUDING: YOUR NAME DATE OF BIRTH CALL BACK NUMBER REASON FOR CALL**this is important as we prioritize the call backs  YOU WILL RECEIVE A CALL BACK THE SAME DAY AS LONG AS YOU CALL BEFORE 4:00 PM

## 2023-07-09 DIAGNOSIS — G4733 Obstructive sleep apnea (adult) (pediatric): Secondary | ICD-10-CM | POA: Diagnosis not present

## 2023-07-10 ENCOUNTER — Other Ambulatory Visit (HOSPITAL_COMMUNITY): Payer: Self-pay | Admitting: Cardiology

## 2023-07-10 DIAGNOSIS — I10 Essential (primary) hypertension: Secondary | ICD-10-CM | POA: Diagnosis not present

## 2023-07-10 DIAGNOSIS — E1165 Type 2 diabetes mellitus with hyperglycemia: Secondary | ICD-10-CM | POA: Diagnosis not present

## 2023-07-10 DIAGNOSIS — N184 Chronic kidney disease, stage 4 (severe): Secondary | ICD-10-CM | POA: Diagnosis not present

## 2023-07-10 DIAGNOSIS — E1151 Type 2 diabetes mellitus with diabetic peripheral angiopathy without gangrene: Secondary | ICD-10-CM | POA: Diagnosis not present

## 2023-07-10 DIAGNOSIS — I4891 Unspecified atrial fibrillation: Secondary | ICD-10-CM | POA: Diagnosis not present

## 2023-07-10 DIAGNOSIS — Z299 Encounter for prophylactic measures, unspecified: Secondary | ICD-10-CM | POA: Diagnosis not present

## 2023-07-15 DIAGNOSIS — I4891 Unspecified atrial fibrillation: Secondary | ICD-10-CM | POA: Diagnosis not present

## 2023-07-15 DIAGNOSIS — E1122 Type 2 diabetes mellitus with diabetic chronic kidney disease: Secondary | ICD-10-CM | POA: Diagnosis not present

## 2023-07-15 DIAGNOSIS — N184 Chronic kidney disease, stage 4 (severe): Secondary | ICD-10-CM | POA: Diagnosis not present

## 2023-07-15 DIAGNOSIS — D808 Other immunodeficiencies with predominantly antibody defects: Secondary | ICD-10-CM | POA: Diagnosis not present

## 2023-07-15 DIAGNOSIS — I129 Hypertensive chronic kidney disease with stage 1 through stage 4 chronic kidney disease, or unspecified chronic kidney disease: Secondary | ICD-10-CM | POA: Diagnosis not present

## 2023-07-15 DIAGNOSIS — I509 Heart failure, unspecified: Secondary | ICD-10-CM | POA: Diagnosis not present

## 2023-07-15 DIAGNOSIS — I130 Hypertensive heart and chronic kidney disease with heart failure and stage 1 through stage 4 chronic kidney disease, or unspecified chronic kidney disease: Secondary | ICD-10-CM | POA: Diagnosis not present

## 2023-07-15 DIAGNOSIS — I779 Disorder of arteries and arterioles, unspecified: Secondary | ICD-10-CM | POA: Diagnosis not present

## 2023-07-16 ENCOUNTER — Ambulatory Visit: Payer: Medicare PPO | Admitting: Physician Assistant

## 2023-07-16 ENCOUNTER — Other Ambulatory Visit: Payer: Self-pay | Admitting: Internal Medicine

## 2023-07-16 ENCOUNTER — Ambulatory Visit (HOSPITAL_COMMUNITY)
Admission: RE | Admit: 2023-07-16 | Discharge: 2023-07-16 | Disposition: A | Discharge status: Home / Self Care | Payer: Medicare PPO | Source: Ambulatory Visit | Attending: Vascular Surgery | Admitting: Vascular Surgery

## 2023-07-16 VITALS — BP 163/57 | HR 46 | Temp 98.4°F | Resp 18 | Ht 64.0 in | Wt 179.5 lb

## 2023-07-16 DIAGNOSIS — I6523 Occlusion and stenosis of bilateral carotid arteries: Secondary | ICD-10-CM

## 2023-07-16 DIAGNOSIS — D688 Other specified coagulation defects: Secondary | ICD-10-CM

## 2023-07-16 DIAGNOSIS — I110 Hypertensive heart disease with heart failure: Secondary | ICD-10-CM

## 2023-07-16 DIAGNOSIS — N184 Chronic kidney disease, stage 4 (severe): Secondary | ICD-10-CM

## 2023-07-16 DIAGNOSIS — E1122 Type 2 diabetes mellitus with diabetic chronic kidney disease: Secondary | ICD-10-CM

## 2023-07-16 DIAGNOSIS — I779 Disorder of arteries and arterioles, unspecified: Secondary | ICD-10-CM

## 2023-07-16 NOTE — Progress Notes (Signed)
Office Note   History of Present Illness   Robert Baldwin is a 82 y.o. ([birth year]) male who presents for surveillance of carotid artery stenosis.  He has a history of left TCAR on 07/02/2022 and right TCAR on 08/20/2022 by [name].  Both of these surgeries were performed for asymptomatic critical ICA stenosis.   The patient returns today for follow up. He denies any recent strokelike symptoms such as slurred speech, facial droop, sudden visual changes, or sudden weakness/numbness.   He states he has just started seeing a nephrologist for his elevated creatinine levels.  His doctors are currently wondering if one of his medications is the source.  He is taking his daily aspirin and statin.  He is also on warfarin.  Current Outpatient Medications  Medication Sig Dispense Refill   amLODipine (NORVASC) 10 MG tablet Take 1 tablet (10 mg total) by mouth daily. 30 tablet 11   aspirin EC 81 MG tablet Take 1 tablet (81 mg total) by mouth daily. Swallow whole.     atorvastatin (LIPITOR) 40 MG tablet Take 40 mg by mouth at bedtime.     cholecalciferol (VITAMIN D) 25 MCG (1000 UNIT) tablet Take 1,000 Units by mouth in the morning and at bedtime.     FARXIGA 10 MG TABS tablet TAKE 1 TABLET BY MOUTH DAILY 30 tablet 4   fish oil-omega-3 fatty acids 1000 MG capsule Take 1 g by mouth every evening.     macitentan (OPSUMIT) 10 MG tablet TAKE 1 TABLET BY MOUTH DAILY 90 tablet 3   Misc Natural Products (GLUCOS-CHONDROIT-MSM COMPLEX) TABS Take 2 tablets by mouth daily.     Multiple Vitamins-Minerals (MULTIVITAMIN WITH MINERALS) tablet Take 1 tablet by mouth in the morning.  Centrum Silver     niacin 500 MG tablet Take 500 mg by mouth in the morning.     tadalafil (CIALIS) 20 MG tablet TAKE 2 TABLETS BY MOUTH DAILY 180 tablet 3   torsemide (DEMADEX) 20 MG tablet Take 2 tablets (40 mg total) by mouth 2 (two) times daily. 180 tablet 1   warfarin (COUMADIN) 5 MG tablet Take 5-7.5 mg by mouth See admin  instructions. Take 5 mg on Mon, Tues, Wed, Fri, and Sat Take 7.5 mg on Thurs and Sun     No current facility-administered medications for this visit.    REVIEW OF SYSTEMS (negative unless checked):   Cardiac:  []  Chest pain or chest pressure? []  Shortness of breath upon activity? []  Shortness of breath when lying flat? []  Irregular heart rhythm?  Vascular:  []  Pain in calf, thigh, or hip brought on by walking? []  Pain in feet at night that wakes you up from your sleep? []  Blood clot in your veins? []  Leg swelling?  Pulmonary:  []  Oxygen at home? []  Productive cough? []  Wheezing?  Neurologic:  []  Sudden weakness in arms or legs? []  Sudden numbness in arms or legs? []  Sudden onset of difficult speaking or slurred speech? []  Temporary loss of vision in one eye? []  Problems with dizziness?  Gastrointestinal:  []  Blood in stool? []  Vomited blood?  Genitourinary:  []  Burning when urinating? []  Blood in urine?  Psychiatric:  []  Major depression  Hematologic:  []  Bleeding problems? []  Problems with blood clotting?  Dermatologic:  []  Rashes or ulcers?  Constitutional:  []  Fever or chills?  Ear/Nose/Throat:  []  Change in hearing? []  Nose bleeds? []  Sore throat?  Musculoskeletal:  []  Back pain? []  Joint pain? []   Muscle pain?   Physical Examination   Vitals:   07/16/23 0841  BP: (!) 163/57  Pulse: (!) 46  Resp: 18  Temp: 98.4 F (36.9 C)  TempSrc: Temporal  SpO2: 93%  Weight: 179 lb 8 oz (81.4 kg)  Height: 5\' 4"  (1.626 m)   Body mass index is 30.81 kg/m.  General:  WDWN in NAD; vital signs documented above Gait: Not observed HENT: WNL, normocephalic Pulmonary: normal non-labored breathing , without rales, rhonchi,  wheezing Cardiac: Regular Abdomen: soft, NT, no masses Skin: without rashes Vascular Exam/Pulses: palpable radial pulses bilaterally Extremities: without ischemic changes, without gangrene , without cellulitis; without open  wounds;  Musculoskeletal: no muscle wasting or atrophy  Neurologic: A&O X 3;  No focal weakness or paresthesias are detected Psychiatric:  The pt has Normal affect.  Non-Invasive Vascular Imaging   Bilateral Carotid Duplex (07/16/2023):  Patent bilateral ICA stents.  50 to 75% stenosis of bilateral stents.   Medical Decision Making   Robert Baldwin Crew is a 82 y.o. male who presents for surveillance of carotid artery stenosis  Based on the patient's vascular studies, the patient's bilateral carotid stents are patent without hemodynamically significant stenosis. There is unchanged 50% stenosis of the distal right ICA stent with a PSV of 184 cm/s. There appears to be slightly elevated velocities at the proximal left ICA stent, with PSV of 310 cm/s. This is new compared to his previous study.  He denies any strokelike symptoms such as slurred speech, facial droop, sudden visual changes, or sudden weakness/numbness. On exam he is neurovascularly intact.  He has palpable and equal radial pulses bilaterally.  He has no carotid bruits Given that the patient has a history of extensive calcific carotid disease, his current velocities on duplex may be his new baseline after stenting.  There is some elevated velocities on the left, which is new compared to his last scan.  I will bring the patient back in 6 months for repeat scan.  If his velocities are unchanged at that time we can extend his follow up   Robert Dubonnet PA-C [facility] Office: [phone]  Clinic MD: Steve Rattler

## 2023-07-17 ENCOUNTER — Ambulatory Visit
Admission: RE | Admit: 2023-07-17 | Discharge: 2023-07-17 | Disposition: A | Discharge status: Home / Self Care | Payer: Medicare PPO | Source: Ambulatory Visit | Attending: Internal Medicine | Admitting: Internal Medicine

## 2023-07-17 DIAGNOSIS — E1122 Type 2 diabetes mellitus with diabetic chronic kidney disease: Secondary | ICD-10-CM

## 2023-07-17 DIAGNOSIS — N184 Chronic kidney disease, stage 4 (severe): Secondary | ICD-10-CM

## 2023-07-17 DIAGNOSIS — N189 Chronic kidney disease, unspecified: Secondary | ICD-10-CM | POA: Diagnosis not present

## 2023-07-17 DIAGNOSIS — D688 Other specified coagulation defects: Secondary | ICD-10-CM

## 2023-07-17 DIAGNOSIS — I110 Hypertensive heart disease with heart failure: Secondary | ICD-10-CM

## 2023-07-17 DIAGNOSIS — I779 Disorder of arteries and arterioles, unspecified: Secondary | ICD-10-CM

## 2023-07-22 NOTE — Progress Notes (Signed)
Advanced Heart Failure Clinic Note   PCP: Kirstie Peri, MD Primary Cardiologist: Dr. Diona Browner Nephrologist: Dr. Valentino Nose HF Cardiologist: Dr. Shirlee Latch  HPI: Robert Baldwin is a 82 y.o. male with history of b/l carotid artery stenosis, DM II, HTN, HLD, permanent atrial fibrillation. He follows with Dr. Diona Browner in the cardiology clinic.    Saw [facility] for carotid artery stenosis in 6/23. Was noting more dyspnea with exertion and lower extremity edema for about a month. He was subsequently started on po lasix 20 mg daily.   He was seen by Dr. Diona Browner in the clinic 04/26/22 for evaluation of recent symptoms and pre-operative evaluation in preparation for left CEA followed by staged right CEA. Echo day of the visit showed EF 70-75%, hyperdynamic LV function, mild LVH, interventricular septum flattened in systole and diastole consistent with RV pressure and volume overload, RV moderately enlarged and moderately reduced, RVSP 120 mmHg, moderate LAE, severe RAE, moderate pericardial effusion, severe TR, dilated IVC. Patient was scheduled for outpatient R/LHC to better assess hemodynamics and further characterize pulmonary hypertension.    [facility] 7/23 showed no significant CAD, RA mean 25, PA 115/30 (58), LVEDP 22 mmHg, PCWP not obtained, Fick CO/CI 6.4/3.1, PAPi 3.4. PVR 5.6 WU. 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 He was diuresed with IV lasix. V/Q scan negative for chronic PE. ANA+. All other serologic test -. HRCT with no definite ILD. Started on tadalafil. Diuresed 24 lbs. Hospitalization complicated by right groin hematoma and received 1 unit PRBCs. Discharged home, weight 179 lbs.  Repeat RHC 8/23 showed elevated PCWP and severe mixed pulmonary arterial/venous hypertension, but PA pressures significantly lower than prior RHC. He was instructed to increase torsemide to 40 mg daily alternating with 20 mg every other day. [name] and [name] held  with hyperkalemia.  Patient had awake left TCAR by [name] in 9/23, no complications.   Follow up 10/23, volume overloaded, weight up 18 lbs. Torsemide increased to 40 bid and spiro 12.5 added back.   S/p right TCAR 10/23. Tolerated procedure well.   Echo 7/24 showed EF 60-65%, D-shaped interventricular septum suggestive of RV pressure/volume overload, moderate RV enlargement with mildly decreased systolic function, PASP 78 mmHg, mild mitral stenosis mean gradient 4, mild aortic stenosis mean gradient 15.   Follow up 7/24, creatinine elevated 2.35>>3.32. 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 Torsemide was increased to 40 mg bid.  Today he returns for HF follow up with his wife. Overall feeling fine. He is SOB walking up steps but otherwise no dyspnea with ADLs or walking on flat ground with his walker. Has compression hose at home but does not wear consistently. Denies palpitations, abnormal bleeding, CP, dizziness, edema, or PND/Orthopnea. Appetite ok. No fever or chills. Weight at home 174 pounds. Taking all medications. BP at home labile, 138/48- 176/62.  ECG (personally reviewed): none ordered today.  Labs (6/23): K 3.8, creatinine 1.53 Labs (7/23): K 5.5, creatinine 1.73 Labs (8/23): K 4.6, creatinine 1.5 Labs (9/23): LDL 56, K 3.9, creatinine 1.61 Labs (10/23): K 4.4, creatinine 1.94 Labs (11/23): K 5, creatinine 2.35 Labs (7/24): LDL 47, hgb 10.4, K 5.3, creatinine 3.32, BUN 102 Labs (9/24): K 4.0, creatinine 2.15  6 minute walk (7/24): 259 m 6 minute walk (  9/24): 195 m  PMH: 1. HTN 2. Hyperlipidemia 3. Type 2 diabetes 4. CKD stage 3 5. Atrial fibrillation: Permanent.  6. Carotid stenosis: CTA neck (6/23) with high grade  stenosis of the bilateral internal carotid arteries.  - S/p left TCAR 9/23.  - S/p right TCAR 10/23. 7. 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  - R/LHC (7/23): no significant CAD; RA mean 25, PA 115/30 (58), LVEDP 22 mmHg, PCWP not obtained, Fick CO/CI 6.4/3.1, PAPi 3.4. PVR 5.6 WU. - RHC (8/23): Mean RA 8, PA 85/40 mean 41, mean PCWP 22, CI 2.86, PVR 3.4 WU - Echo (7/24): EF 60-65%, D-shaped interventricular septum suggestive of RV pressure/volume overload, moderate RV enlargement with mildly decreased systolic function, PASP 78 mmHg, mild mitral stenosis mean gradient 4, mild aortic stenosis mean gradient 15.  - RHC (7/24): RA mean 9, PA 78/16 (44), PCWP 17 (prominent v-waves to 31), CO/CI (Fick) 7.63/4.09, PVR 3.54 WU 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  See RHC and echo data above.   - V/Q scan (7/23): No evidence for chronic PE.  - High resolution CT chest (7/23): No definite ILD.  - + ANA -  RHC (7/24): RA mean 9, PA 78/16 (44), PCWP 17 (prominent v-waves to 31), CO/CI (Fick) 7.63/4.09, PVR 3.54 WU  Current Outpatient Medications  Medication Sig Dispense Refill   amLODipine (NORVASC) 10 MG tablet Take 1 tablet (10 mg total) by mouth daily. 30 tablet 11   aspirin EC 81 MG tablet Take 1 tablet (81 mg total) by mouth daily. Swallow whole.     atorvastatin (LIPITOR) 40 MG tablet Take 40 mg by mouth at bedtime.     cholecalciferol (VITAMIN D) 25 MCG (1000 UNIT) tablet Take 1,000 Units by mouth in the morning and at bedtime.     FARXIGA 10 MG TABS tablet TAKE 1 TABLET BY MOUTH DAILY 30 tablet 4   fish oil-omega-3 fatty acids 1000 MG capsule Take 1 g by mouth  every evening.     macitentan (OPSUMIT) 10 MG tablet TAKE 1 TABLET BY MOUTH DAILY 90 tablet 3   Misc Natural Products (GLUCOS-CHONDROIT-MSM COMPLEX) TABS Take 2 tablets by mouth daily.     Multiple Vitamins-Minerals (MULTIVITAMIN WITH MINERALS) tablet Take 1 tablet by mouth in the morning.  Centrum Silver     niacin 500 MG tablet Take 500 mg by mouth in the morning.     tadalafil (CIALIS) 20 MG tablet TAKE 2 TABLETS BY MOUTH DAILY 180 tablet 3   torsemide (DEMADEX) 20 MG tablet Take 2 tablets (40 mg total) by mouth 2 (two) times daily. 180 tablet 1   warfarin (COUMADIN) 5 MG tablet Take 5-7.5 mg by mouth See admin instructions. Take 5 mg on Mon, Tues, Wed, Fri, and Sat Take 7.5 mg on Thurs and Sun     No current facility-administered medications for this encounter.   Allergies  Allergen Reactions   Other Rash    Bandaids   Social History   Socioeconomic History   Marital status: Married    Spouse name: Tammy   Number of children: Not on file   Years of education: Not on file   Highest education level: Not on file  Occupational  History   Occupation: Full time    Comment: Contractor  Tobacco Use   Smoking status: Never    Passive exposure: Never   Smokeless tobacco: Never   Tobacco comments:    Never smoke 05/18/22  Vaping Use   Vaping status: Never Used  Substance and Sexual Activity   Alcohol use: No    Alcohol/week: 0.0 standard drinks of alcohol   Drug use: No   Sexual activity: Not on file  Other Topics Concern   Not on file  Social History Narrative   Not on file   Social Determinants of Health   Financial Resource Strain: Not on file  Food Insecurity: Not on file  Transportation Needs: Not on file  Physical Activity: Not on file  Stress: Not on file  Social Connections: Not on file  Intimate Partner Violence: Not on file   Family History  Problem Relation Age of Onset   Stroke Other    Heart disease Other    BP (!) 162/60   Pulse (!) 48   Wt 80.7 kg  (178 lb)   SpO2 92%   BMI 30.55 kg/m   Wt Readings from Last 3 Encounters:  07/23/23 80.7 kg (178 lb)  07/16/23 81.4 kg (179 lb 8 oz)  06/25/23 80.5 kg (177 lb 6.4 oz)   PHYSICAL EXAM: General:  NAD. No resp difficulty, walked into clinic with RW HEENT: Normal Neck: Supple. No JVD. Carotids 2+ bilat; no bruits. No lymphadenopathy or thryomegaly appreciated. Cor: PMI nondisplaced. Brady irregular rate & rhythm. No rubs, gallops, 2/6 SEM RUSB Lungs: Clear Abdomen: Soft, nontender, nondistended. No hepatosplenomegaly. No bruits or masses. Good bowel sounds. Extremities: No cyanosis, clubbing, rash, 2+ pre-tibial BLE edema Neuro: Alert & oriented x 3, cranial nerves grossly intact. Moves all 4 extremities w/o difficulty. Affect pleasant.  ASSESSMENT & PLAN: 1. 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 RHC/LHC 7/23 showed preserved cardiac output with severe pulmonary hypertension. 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  PA pressure was still high, but is significantly lower than prior RHC. Echo 7/24 EF 60-65%, D-shaped interventricular septum suggestive of RV pressure/volume overload, moderate RV enlargement with mildly decreased systolic function, PASP 78 mmHg, mild mitral stenosis mean gradient 4, mild aortic stenosis mean gradient 15.  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 Today, NYHA class II symptoms, he is mildly volume  overloaded. GDMT limited by CKD.   - Increase torsemide to 80 mg qam/40 mg qpm, add 10 KCL daily. Request labs from recent Nephrology visit. Repeat BMET in 10-14 days. - Continue Farxiga 10 mg daily. No GU symptoms. - Off Entresto and spiro with declining renal function and elevated K. - I asked him to wear his compression hose. 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  Some improvement on 8/23 RHC still with severe mixed PAH/PVH with PVR down to 3.4 WU.  He has never smoked and has no known lung disease.  He has been on warfarin for atrial fibrillation long-term. No known rheumatologic illness.  Not a drinker though he has some signs of cirrhosis by prior liver imaging.  V/Q scan in 7/23 negative for chronic PEs and HRCT chest without definite ILD. Serologic workup showed anti-SCL 70 neg, anti-centromere Ab neg, RF neg, ANA with reflex positive. Suspicion for component of group 1 PH (mixed group 1 and 2).  Echo 7/24 showed moderately enlarged/mildly dysfunctional RV with PASP 78 mmHg. 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 - Repeat next visit. - Diuretics as above. - Continue Opsumit 10 mg daily. - Continue tadalafil 40 mg daily. - Needs to try to use CPAP more often.  - Would not add Uptravi at this time, need better control of volume.  3. Atrial fibrillation: Permanent.   - Continue warfarin.   - Bradycardic, does not need nodal blocker. Asymptomatic 4. DM2: Continue SGLT2i 5. Carotid stenosis: CTA neck in 6/23 showed high grade bilateral ICA stenoses. He has had awake L TCAR 9/23 and R TCAR 10/23.  - He is on warfarin long-term for AF.  I think he can stop ASA now as he is 1 year after last TCAR.  - Continue statin, goal LDL < 70.  6. HTN: BP elevated.  - Start hydralazine 10  mg tid. - Continue amlodipine 10 mg daily. - Continue to check BP daily and log. - Needs to use CPAP 7. CKD stage IIIb: new baseline SCr 2.3 - Continue SGLT2i. Check labs in 10-14 days. - Referred to nephrology.  8. Aortic stenosis: Mild on 7/24 echo.  9. Mitral stenosis: Mild on 7/24 echo.   Follow up in 3 months with Dr. Kathreen Cornfield [facility] FNP-BC 07/23/2023

## 2023-07-23 ENCOUNTER — Encounter (HOSPITAL_COMMUNITY): Payer: Self-pay

## 2023-07-23 ENCOUNTER — Ambulatory Visit (HOSPITAL_COMMUNITY)
Admission: RE | Admit: 2023-07-23 | Discharge: 2023-07-23 | Disposition: A | Discharge status: Home / Self Care | Payer: Medicare PPO | Source: Ambulatory Visit | Attending: Family Medicine | Admitting: Family Medicine

## 2023-07-23 VITALS — BP 162/60 | HR 48 | Wt 178.0 lb

## 2023-07-23 DIAGNOSIS — I050 Rheumatic mitral stenosis: Secondary | ICD-10-CM

## 2023-07-23 DIAGNOSIS — I350 Nonrheumatic aortic (valve) stenosis: Secondary | ICD-10-CM

## 2023-07-23 DIAGNOSIS — I4821 Permanent atrial fibrillation: Secondary | ICD-10-CM | POA: Insufficient documentation

## 2023-07-23 DIAGNOSIS — R0602 Shortness of breath: Secondary | ICD-10-CM | POA: Insufficient documentation

## 2023-07-23 DIAGNOSIS — I4891 Unspecified atrial fibrillation: Secondary | ICD-10-CM | POA: Diagnosis not present

## 2023-07-23 DIAGNOSIS — Z7901 Long term (current) use of anticoagulants: Secondary | ICD-10-CM | POA: Insufficient documentation

## 2023-07-23 DIAGNOSIS — I272 Pulmonary hypertension, unspecified: Secondary | ICD-10-CM

## 2023-07-23 DIAGNOSIS — Z7984 Long term (current) use of oral hypoglycemic drugs: Secondary | ICD-10-CM | POA: Diagnosis not present

## 2023-07-23 DIAGNOSIS — I6523 Occlusion and stenosis of bilateral carotid arteries: Secondary | ICD-10-CM

## 2023-07-23 DIAGNOSIS — Z79899 Other long term (current) drug therapy: Secondary | ICD-10-CM | POA: Diagnosis not present

## 2023-07-23 DIAGNOSIS — E1122 Type 2 diabetes mellitus with diabetic chronic kidney disease: Secondary | ICD-10-CM | POA: Insufficient documentation

## 2023-07-23 DIAGNOSIS — E1159 Type 2 diabetes mellitus with other circulatory complications: Secondary | ICD-10-CM | POA: Diagnosis not present

## 2023-07-23 DIAGNOSIS — I130 Hypertensive heart and chronic kidney disease with heart failure and stage 1 through stage 4 chronic kidney disease, or unspecified chronic kidney disease: Secondary | ICD-10-CM | POA: Diagnosis not present

## 2023-07-23 DIAGNOSIS — I5032 Chronic diastolic (congestive) heart failure: Secondary | ICD-10-CM | POA: Insufficient documentation

## 2023-07-23 DIAGNOSIS — N1832 Chronic kidney disease, stage 3b: Secondary | ICD-10-CM | POA: Insufficient documentation

## 2023-07-23 DIAGNOSIS — I2721 Secondary pulmonary arterial hypertension: Secondary | ICD-10-CM | POA: Insufficient documentation

## 2023-07-23 DIAGNOSIS — E785 Hyperlipidemia, unspecified: Secondary | ICD-10-CM | POA: Diagnosis not present

## 2023-07-23 DIAGNOSIS — I10 Essential (primary) hypertension: Secondary | ICD-10-CM | POA: Diagnosis not present

## 2023-07-23 DIAGNOSIS — I080 Rheumatic disorders of both mitral and aortic valves: Secondary | ICD-10-CM | POA: Diagnosis not present

## 2023-07-23 MED ORDER — POTASSIUM CHLORIDE ER 10 MEQ PO TBCR: 10.0000 meq | EXTENDED_RELEASE_TABLET | Freq: Every day | ORAL | 3 refills | Status: DC

## 2023-07-23 MED ORDER — TORSEMIDE 20 MG PO TABS: ORAL_TABLET | ORAL | 5 refills | Status: DC

## 2023-07-23 MED ORDER — HYDRALAZINE HCL 10 MG PO TABS: 10.0000 mg | ORAL_TABLET | Freq: Three times a day (TID) | ORAL | 3 refills | Status: DC

## 2023-07-23 NOTE — Patient Instructions (Signed)
STOP Asprin  RESTART Hydralazine 10 mg Three times a day  INCREASE Torsemide to 80 mg in the morning and 40 mg in the evenings  START Potassium 10 mEq ( 1 Tab ) daily.  Blood work in 10-14 days at [facility] WEAR YOUR COMPRESSION STOCKINGS.  KEEP FOLLOW UP WITH DR. McLean as scheduled.  If you have any questions or concerns before your next appointment please send [place] a message through [place] or call our office at [phone].    TO LEAVE A MESSAGE FOR THE NURSE SELECT OPTION 2, PLEASE LEAVE A MESSAGE INCLUDING: YOUR NAME DATE OF BIRTH CALL BACK NUMBER REASON FOR CALL**this is important as we prioritize the call backs  YOU WILL RECEIVE A CALL BACK THE SAME DAY AS LONG AS YOU CALL BEFORE 4:00 PM  At the Advanced Heart Failure Clinic, you and your health needs are our priority. As part of our continuing mission to provide you with exceptional heart care, we have created designated Provider Care Teams. These Care Teams include your primary Cardiologist (physician) and Advanced Practice Providers (APPs- Physician Assistants and Nurse Practitioners) who all work together to provide you with the care you need, when you need it.   You may see any of the following providers on your designated Care Team at your next follow up: Dr Arvilla Meres Dr Marca Ancona Dr. Marcos Eke, NP Robbie Lis, [place] [facility] [place], [place] Brynda Peon, NP Karle Plumber, PharmD   Please be sure to bring in all your medications bottles to every appointment.    Thank you for choosing [facility]

## 2023-07-24 DIAGNOSIS — C44319 Basal cell carcinoma of skin of other parts of face: Secondary | ICD-10-CM | POA: Diagnosis not present

## 2023-07-24 DIAGNOSIS — D0462 Carcinoma in situ of skin of left upper limb, including shoulder: Secondary | ICD-10-CM | POA: Diagnosis not present

## 2023-07-24 DIAGNOSIS — Z08 Encounter for follow-up examination after completed treatment for malignant neoplasm: Secondary | ICD-10-CM | POA: Diagnosis not present

## 2023-07-24 DIAGNOSIS — R229 Localized swelling, mass and lump, unspecified: Secondary | ICD-10-CM | POA: Diagnosis not present

## 2023-07-24 DIAGNOSIS — D485 Neoplasm of uncertain behavior of skin: Secondary | ICD-10-CM | POA: Diagnosis not present

## 2023-07-24 DIAGNOSIS — C44629 Squamous cell carcinoma of skin of left upper limb, including shoulder: Secondary | ICD-10-CM | POA: Diagnosis not present

## 2023-07-24 DIAGNOSIS — L570 Actinic keratosis: Secondary | ICD-10-CM | POA: Diagnosis not present

## 2023-07-24 DIAGNOSIS — Z85828 Personal history of other malignant neoplasm of skin: Secondary | ICD-10-CM | POA: Diagnosis not present

## 2023-07-29 ENCOUNTER — Other Ambulatory Visit: Payer: Self-pay

## 2023-07-29 DIAGNOSIS — I6523 Occlusion and stenosis of bilateral carotid arteries: Secondary | ICD-10-CM

## 2023-08-05 ENCOUNTER — Other Ambulatory Visit (HOSPITAL_COMMUNITY): Payer: Self-pay | Admitting: Cardiology

## 2023-08-05 DIAGNOSIS — I5032 Chronic diastolic (congestive) heart failure: Secondary | ICD-10-CM

## 2023-08-06 DIAGNOSIS — Z0000 Encounter for general adult medical examination without abnormal findings: Secondary | ICD-10-CM | POA: Diagnosis not present

## 2023-08-06 DIAGNOSIS — I5032 Chronic diastolic (congestive) heart failure: Secondary | ICD-10-CM | POA: Diagnosis not present

## 2023-08-06 DIAGNOSIS — Z299 Encounter for prophylactic measures, unspecified: Secondary | ICD-10-CM | POA: Diagnosis not present

## 2023-08-06 DIAGNOSIS — I739 Peripheral vascular disease, unspecified: Secondary | ICD-10-CM | POA: Diagnosis not present

## 2023-08-06 DIAGNOSIS — Z23 Encounter for immunization: Secondary | ICD-10-CM | POA: Diagnosis not present

## 2023-08-06 DIAGNOSIS — I10 Essential (primary) hypertension: Secondary | ICD-10-CM | POA: Diagnosis not present

## 2023-08-06 DIAGNOSIS — I700 Atherosclerosis of aorta: Secondary | ICD-10-CM | POA: Diagnosis not present

## 2023-08-08 ENCOUNTER — Other Ambulatory Visit (HOSPITAL_COMMUNITY): Payer: Self-pay | Admitting: Adult Health

## 2023-08-08 ENCOUNTER — Other Ambulatory Visit (HOSPITAL_COMMUNITY): Payer: Self-pay | Admitting: Vascular Surgery

## 2023-08-08 DIAGNOSIS — G4733 Obstructive sleep apnea (adult) (pediatric): Secondary | ICD-10-CM | POA: Diagnosis not present

## 2023-08-29 DIAGNOSIS — C44319 Basal cell carcinoma of skin of other parts of face: Secondary | ICD-10-CM | POA: Diagnosis not present

## 2023-08-29 DIAGNOSIS — C44219 Basal cell carcinoma of skin of left ear and external auricular canal: Secondary | ICD-10-CM | POA: Diagnosis not present

## 2023-08-29 DIAGNOSIS — D485 Neoplasm of uncertain behavior of skin: Secondary | ICD-10-CM | POA: Diagnosis not present

## 2023-09-05 DIAGNOSIS — Z299 Encounter for prophylactic measures, unspecified: Secondary | ICD-10-CM | POA: Diagnosis not present

## 2023-09-05 DIAGNOSIS — I4891 Unspecified atrial fibrillation: Secondary | ICD-10-CM | POA: Diagnosis not present

## 2023-09-05 DIAGNOSIS — E1169 Type 2 diabetes mellitus with other specified complication: Secondary | ICD-10-CM | POA: Diagnosis not present

## 2023-09-05 DIAGNOSIS — I10 Essential (primary) hypertension: Secondary | ICD-10-CM | POA: Diagnosis not present

## 2023-09-08 DIAGNOSIS — G4733 Obstructive sleep apnea (adult) (pediatric): Secondary | ICD-10-CM | POA: Diagnosis not present

## 2023-09-09 ENCOUNTER — Encounter: Payer: Self-pay | Admitting: Cardiology

## 2023-09-18 ENCOUNTER — Ambulatory Visit: Payer: Medicare PPO | Attending: Cardiology | Admitting: Cardiology

## 2023-09-18 ENCOUNTER — Encounter: Payer: Self-pay | Admitting: Cardiology

## 2023-09-18 VITALS — BP 154/42 | HR 39 | Ht 67.0 in | Wt 175.0 lb

## 2023-09-18 DIAGNOSIS — I272 Pulmonary hypertension, unspecified: Secondary | ICD-10-CM | POA: Diagnosis not present

## 2023-09-18 DIAGNOSIS — I4821 Permanent atrial fibrillation: Secondary | ICD-10-CM | POA: Diagnosis not present

## 2023-09-18 DIAGNOSIS — N1832 Chronic kidney disease, stage 3b: Secondary | ICD-10-CM | POA: Diagnosis not present

## 2023-09-18 DIAGNOSIS — I10 Essential (primary) hypertension: Secondary | ICD-10-CM | POA: Diagnosis not present

## 2023-09-18 DIAGNOSIS — I5032 Chronic diastolic (congestive) heart failure: Secondary | ICD-10-CM

## 2023-09-18 MED ORDER — HYDRALAZINE HCL 25 MG PO TABS: 25.0000 mg | ORAL_TABLET | Freq: Three times a day (TID) | ORAL | 4 refills | Status: DC

## 2023-09-18 NOTE — Patient Instructions (Addendum)
Medication Instructions:  Your physician has recommended you make the following change in your medication:  Increase hydralazine to 25 mg three times daily Continue all other medications as prescribed  Labwork: none  Testing/Procedures: none  Follow-Up: Your physician recommends that you schedule a follow-up appointment in: 4 months  Any Other Special Instructions Will Be Listed Below (If Applicable).  If you need a refill on your cardiac medications before your next appointment, please call your pharmacy.

## 2023-09-18 NOTE — Progress Notes (Signed)
Cardiology Office Note  Date: 09/18/2023   ID: Robert Baldwin, Robert Baldwin March 25, 1941, MRN [id number]  History of Present Illness: Robert Baldwin is an 81 y.o. male last seen in June.  He has had interval follow-up in the heart failure clinic, I reviewed the note from October.  He is here for a routine visit, currently reporting NYHA class II dyspnea with typical activities, reasonable control of leg swelling on diuretics and with compression stockings.  His weight is down a few pounds.  He denies any orthopnea or PND, no exertional chest pain.  He continues on Coumadin with follow-up by PCP.  I reviewed the remainder of his medications.  He had a list in his wallet that is out of date and not consistent with the last heart failure clinic note.  We called his pharmacy and updated things.  He has been off Entresto and Aldactone, most recent creatinine down to 1.83 and potassium 4.2.  Demadex was uptitrated at the last clinic visit, he remains on a potassium supplement, he was also started on hydralazine 10 mg 3 times daily.  He has follow-up with [facility] in September.  Physical Exam: VS:  BP (!) 154/42 (BP Location: Left Arm)   Pulse (!) 39   Ht 5\' 7"  (1.702 m)   Wt 175 lb (79.4 kg)   SpO2 96%   BMI 27.41 kg/m , BMI Body mass index is 27.41 kg/m.  Wt Readings from Last 3 Encounters:  09/18/23 175 lb (79.4 kg)  07/23/23 178 lb (80.7 kg)  07/16/23 179 lb 8 oz (81.4 kg)    General: Patient appears comfortable at rest. HEENT: Conjunctiva and lids normal. Neck: Supple, no elevated JVP.Marland Kitchen Lungs: Clear to auscultation, nonlabored breathing at rest. Cardiac: Irregularly irregular, 2/6 systolic murmur without gallop. Extremities: 2+ leg edema, not wearing compression hose today.  ECG:  An ECG dated 06/25/2023 was personally reviewed today and demonstrated:  Slow atrial fibrillation with right bundle branch block.  Labwork: 05/14/2023: Platelets 175 05/21/2023: Hemoglobin 10.5 06/25/2023: B Natriuretic  Peptide 729.6; BUN 59; Creatinine, Ser 2.15; Potassium 4.0; Sodium 139     Component Value Date/Time   CHOL 98 05/14/2023 1110   TRIG 44 05/14/2023 1110   HDL 42 05/14/2023 1110   CHOLHDL 2.3 05/14/2023 1110   VLDL 9 05/14/2023 1110   LDLCALC 47 05/14/2023 1110  October 2024: Hemoglobin 11, platelets 203, BUN 46, creatinine 1.83, potassium 4.2, AST 26, ALT 17  Other Studies Reviewed Today:  Echocardiogram 05/14/2023: 1. Left ventricular ejection fraction, by estimation, is 60 to 65%. The  left ventricle has normal function. The left ventricle has no regional  wall motion abnormalities. Left ventricular diastolic parameters are  indeterminate.   2. Mildly D-shaped interventricular septum suggestive of RV  pressure/volume overload. Right ventricular systolic function is mildly  reduced. The right ventricular size is moderately enlarged. There is  severely elevated pulmonary artery systolic  pressure. The estimated right ventricular systolic pressure is 78.2 mmHg.   3. Left atrial size was moderately dilated.   4. Right atrial size was moderately dilated.   5. The mitral valve is degenerative. No evidence of mitral valve  regurgitation. Mild mitral stenosis. The mean mitral valve gradient is 4.0  mmHg. Moderate mitral annular calcification.   6. The aortic valve is tricuspid. There is moderate calcification of the  aortic valve. Aortic valve regurgitation is not visualized. Mild aortic  valve stenosis. Aortic valve mean gradient measures 15.0 mmHg.   7. The  inferior vena cava is dilated in size with >50% respiratory  variability, suggesting right atrial pressure of 8 mmHg.   8. The patient was in atrial fibrillation.   Right heart catheterization 05/21/2023: 1. Mildly elevated PCWP with prominent v-waves suggestive of mitral valve disease.  2. 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  3. Mildly elevated RA pressure.  4. Preserved cardiac output.    Carotid Dopplers 07/16/2023: Summary:  Right Carotid: Right ICA stent is consistent with 50-75% stenosis.  Acoustic                shadowing may limit ability to capture higher velocities.   Left Carotid: Left ICA stent is cosistent with evidence of 50-75%  stenosis.   Vertebrals: Bilateral vertebral arteries demonstrate antegrade flow.  Subclavians: Normal flow hemodynamics were seen in bilateral subclavian               arteries.   Assessment and Plan:  1.  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 Continues to follow in the advanced heart failure clinic.  Continue tadalafil and Opsumit.  Demadex uptitrated at last heart failure clinic visit, he remains on potassium supplement and Farxiga as well.  No changes were made today.  Weight is down about 3 pounds.   2.  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  Continues to follow with [name] with [facility].  I reviewed the last clinic note and interval carotid Dopplers.  He remains on Lipitor.   3.  Permanent atrial fibrillation with CHA2DS2-VASc score 5.  He is on Coumadin for stroke prophylaxis.  Chronic, slow ventricular response and not clearly symptomatic.  He is not on beta-blockers due to this.  Coumadin followed by PCP.   4.  OSA on CPAP.  Keep follow-up with Dr. Mayford Knife.   5.  CKD stage IIIb-IV.  Creatinine 1.83 with potassium 4.2 in October.   6.  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  LDL 47 in July.  7.  Primary hypertension.  Increase hydralazine to 25 mg 3 times daily and continue Norvasc.  Disposition:  Follow up  heart failure clinic in January, 31-month visit here.  Signed, Jonelle Sidle, M.D., F.A.C.C. [facility] at [facility]

## 2023-09-23 DIAGNOSIS — C44219 Basal cell carcinoma of skin of left ear and external auricular canal: Secondary | ICD-10-CM | POA: Diagnosis not present

## 2023-09-23 DIAGNOSIS — C44311 Basal cell carcinoma of skin of nose: Secondary | ICD-10-CM | POA: Diagnosis not present

## 2023-09-23 DIAGNOSIS — D485 Neoplasm of uncertain behavior of skin: Secondary | ICD-10-CM | POA: Diagnosis not present

## 2023-10-01 ENCOUNTER — Telehealth (HOSPITAL_COMMUNITY): Payer: Self-pay | Admitting: Pharmacist

## 2023-10-01 NOTE — Telephone Encounter (Signed)
Advanced Heart Failure Patient Advocate Encounter  Prior Authorization for tadalafil (key [id number]) has been approved.    PA# [id number] Effective dates: 10/22/22 through 10/21/24  Karle Plumber, PharmD, BCPS, BCCP, CPP Heart Failure Clinic Pharmacist [phone]

## 2023-10-03 DIAGNOSIS — D0462 Carcinoma in situ of skin of left upper limb, including shoulder: Secondary | ICD-10-CM | POA: Diagnosis not present

## 2023-10-07 DIAGNOSIS — E1169 Type 2 diabetes mellitus with other specified complication: Secondary | ICD-10-CM | POA: Diagnosis not present

## 2023-10-07 DIAGNOSIS — Z299 Encounter for prophylactic measures, unspecified: Secondary | ICD-10-CM | POA: Diagnosis not present

## 2023-10-07 DIAGNOSIS — I5032 Chronic diastolic (congestive) heart failure: Secondary | ICD-10-CM | POA: Diagnosis not present

## 2023-10-07 DIAGNOSIS — N184 Chronic kidney disease, stage 4 (severe): Secondary | ICD-10-CM | POA: Diagnosis not present

## 2023-10-07 DIAGNOSIS — I4891 Unspecified atrial fibrillation: Secondary | ICD-10-CM | POA: Diagnosis not present

## 2023-10-07 DIAGNOSIS — I10 Essential (primary) hypertension: Secondary | ICD-10-CM | POA: Diagnosis not present

## 2023-10-08 DIAGNOSIS — G4733 Obstructive sleep apnea (adult) (pediatric): Secondary | ICD-10-CM | POA: Diagnosis not present

## 2023-10-09 ENCOUNTER — Ambulatory Visit: Payer: Medicare PPO | Admitting: Cardiology

## 2023-10-28 ENCOUNTER — Encounter (HOSPITAL_COMMUNITY): Payer: Medicare PPO | Admitting: Cardiology

## 2023-10-31 DIAGNOSIS — N184 Chronic kidney disease, stage 4 (severe): Secondary | ICD-10-CM | POA: Diagnosis not present

## 2023-11-05 DIAGNOSIS — I10 Essential (primary) hypertension: Secondary | ICD-10-CM | POA: Diagnosis not present

## 2023-11-05 DIAGNOSIS — D692 Other nonthrombocytopenic purpura: Secondary | ICD-10-CM | POA: Diagnosis not present

## 2023-11-05 DIAGNOSIS — Z299 Encounter for prophylactic measures, unspecified: Secondary | ICD-10-CM | POA: Diagnosis not present

## 2023-11-05 DIAGNOSIS — R059 Cough, unspecified: Secondary | ICD-10-CM | POA: Diagnosis not present

## 2023-11-05 DIAGNOSIS — I779 Disorder of arteries and arterioles, unspecified: Secondary | ICD-10-CM | POA: Diagnosis not present

## 2023-11-05 DIAGNOSIS — I272 Pulmonary hypertension, unspecified: Secondary | ICD-10-CM | POA: Diagnosis not present

## 2023-11-05 DIAGNOSIS — E113393 Type 2 diabetes mellitus with moderate nonproliferative diabetic retinopathy without macular edema, bilateral: Secondary | ICD-10-CM | POA: Diagnosis not present

## 2023-11-07 DIAGNOSIS — N184 Chronic kidney disease, stage 4 (severe): Secondary | ICD-10-CM | POA: Diagnosis not present

## 2023-11-07 DIAGNOSIS — J069 Acute upper respiratory infection, unspecified: Secondary | ICD-10-CM | POA: Diagnosis not present

## 2023-11-07 DIAGNOSIS — I10 Essential (primary) hypertension: Secondary | ICD-10-CM | POA: Diagnosis not present

## 2023-11-07 DIAGNOSIS — I5032 Chronic diastolic (congestive) heart failure: Secondary | ICD-10-CM | POA: Diagnosis not present

## 2023-11-07 DIAGNOSIS — E1169 Type 2 diabetes mellitus with other specified complication: Secondary | ICD-10-CM | POA: Diagnosis not present

## 2023-11-07 DIAGNOSIS — I509 Heart failure, unspecified: Secondary | ICD-10-CM | POA: Diagnosis not present

## 2023-11-07 DIAGNOSIS — E1122 Type 2 diabetes mellitus with diabetic chronic kidney disease: Secondary | ICD-10-CM | POA: Diagnosis not present

## 2023-11-07 DIAGNOSIS — I130 Hypertensive heart and chronic kidney disease with heart failure and stage 1 through stage 4 chronic kidney disease, or unspecified chronic kidney disease: Secondary | ICD-10-CM | POA: Diagnosis not present

## 2023-11-07 DIAGNOSIS — I4891 Unspecified atrial fibrillation: Secondary | ICD-10-CM | POA: Diagnosis not present

## 2023-11-07 DIAGNOSIS — Z299 Encounter for prophylactic measures, unspecified: Secondary | ICD-10-CM | POA: Diagnosis not present

## 2023-11-08 DIAGNOSIS — G4733 Obstructive sleep apnea (adult) (pediatric): Secondary | ICD-10-CM | POA: Diagnosis not present

## 2023-11-13 ENCOUNTER — Emergency Department (HOSPITAL_COMMUNITY): Payer: Medicare PPO

## 2023-11-13 ENCOUNTER — Encounter (HOSPITAL_COMMUNITY): Payer: Self-pay

## 2023-11-13 ENCOUNTER — Inpatient Hospital Stay (HOSPITAL_COMMUNITY)
Admission: EM | Admit: 2023-11-13 | Discharge: 2023-11-29 | DRG: 193 | Disposition: A | Discharge status: Home Health | Payer: Medicare PPO | Attending: Internal Medicine | Admitting: Internal Medicine

## 2023-11-13 ENCOUNTER — Other Ambulatory Visit: Payer: Self-pay

## 2023-11-13 DIAGNOSIS — I4510 Unspecified right bundle-branch block: Secondary | ICD-10-CM | POA: Diagnosis present

## 2023-11-13 DIAGNOSIS — J90 Pleural effusion, not elsewhere classified: Secondary | ICD-10-CM | POA: Diagnosis not present

## 2023-11-13 DIAGNOSIS — K7689 Other specified diseases of liver: Secondary | ICD-10-CM | POA: Diagnosis not present

## 2023-11-13 DIAGNOSIS — Z1152 Encounter for screening for COVID-19: Secondary | ICD-10-CM

## 2023-11-13 DIAGNOSIS — R0609 Other forms of dyspnea: Secondary | ICD-10-CM | POA: Diagnosis not present

## 2023-11-13 DIAGNOSIS — I493 Ventricular premature depolarization: Secondary | ICD-10-CM | POA: Diagnosis present

## 2023-11-13 DIAGNOSIS — I272 Pulmonary hypertension, unspecified: Secondary | ICD-10-CM | POA: Diagnosis not present

## 2023-11-13 DIAGNOSIS — E1122 Type 2 diabetes mellitus with diabetic chronic kidney disease: Secondary | ICD-10-CM | POA: Diagnosis present

## 2023-11-13 DIAGNOSIS — I878 Other specified disorders of veins: Secondary | ICD-10-CM | POA: Diagnosis present

## 2023-11-13 DIAGNOSIS — I5033 Acute on chronic diastolic (congestive) heart failure: Secondary | ICD-10-CM | POA: Diagnosis not present

## 2023-11-13 DIAGNOSIS — E876 Hypokalemia: Secondary | ICD-10-CM | POA: Diagnosis not present

## 2023-11-13 DIAGNOSIS — E1165 Type 2 diabetes mellitus with hyperglycemia: Secondary | ICD-10-CM | POA: Diagnosis present

## 2023-11-13 DIAGNOSIS — I4821 Permanent atrial fibrillation: Secondary | ICD-10-CM | POA: Diagnosis present

## 2023-11-13 DIAGNOSIS — J189 Pneumonia, unspecified organism: Principal | ICD-10-CM

## 2023-11-13 DIAGNOSIS — I130 Hypertensive heart and chronic kidney disease with heart failure and stage 1 through stage 4 chronic kidney disease, or unspecified chronic kidney disease: Secondary | ICD-10-CM | POA: Diagnosis present

## 2023-11-13 DIAGNOSIS — I10 Essential (primary) hypertension: Secondary | ICD-10-CM | POA: Diagnosis present

## 2023-11-13 DIAGNOSIS — R0902 Hypoxemia: Secondary | ICD-10-CM | POA: Diagnosis not present

## 2023-11-13 DIAGNOSIS — I959 Hypotension, unspecified: Secondary | ICD-10-CM | POA: Diagnosis present

## 2023-11-13 DIAGNOSIS — R7401 Elevation of levels of liver transaminase levels: Secondary | ICD-10-CM | POA: Diagnosis not present

## 2023-11-13 DIAGNOSIS — R918 Other nonspecific abnormal finding of lung field: Secondary | ICD-10-CM | POA: Diagnosis not present

## 2023-11-13 DIAGNOSIS — I5032 Chronic diastolic (congestive) heart failure: Secondary | ICD-10-CM | POA: Diagnosis not present

## 2023-11-13 DIAGNOSIS — E782 Mixed hyperlipidemia: Secondary | ICD-10-CM | POA: Diagnosis present

## 2023-11-13 DIAGNOSIS — I4891 Unspecified atrial fibrillation: Secondary | ICD-10-CM | POA: Diagnosis present

## 2023-11-13 DIAGNOSIS — Z299 Encounter for prophylactic measures, unspecified: Secondary | ICD-10-CM | POA: Diagnosis not present

## 2023-11-13 DIAGNOSIS — I3139 Other pericardial effusion (noninflammatory): Secondary | ICD-10-CM | POA: Diagnosis not present

## 2023-11-13 DIAGNOSIS — R791 Abnormal coagulation profile: Secondary | ICD-10-CM | POA: Diagnosis present

## 2023-11-13 DIAGNOSIS — I50810 Right heart failure, unspecified: Secondary | ICD-10-CM | POA: Diagnosis not present

## 2023-11-13 DIAGNOSIS — J181 Lobar pneumonia, unspecified organism: Principal | ICD-10-CM | POA: Diagnosis present

## 2023-11-13 DIAGNOSIS — I2721 Secondary pulmonary arterial hypertension: Secondary | ICD-10-CM | POA: Diagnosis present

## 2023-11-13 DIAGNOSIS — N179 Acute kidney failure, unspecified: Secondary | ICD-10-CM | POA: Diagnosis present

## 2023-11-13 DIAGNOSIS — I6523 Occlusion and stenosis of bilateral carotid arteries: Secondary | ICD-10-CM | POA: Diagnosis present

## 2023-11-13 DIAGNOSIS — I5031 Acute diastolic (congestive) heart failure: Secondary | ICD-10-CM | POA: Diagnosis not present

## 2023-11-13 DIAGNOSIS — Z91048 Other nonmedicinal substance allergy status: Secondary | ICD-10-CM

## 2023-11-13 DIAGNOSIS — Z9889 Other specified postprocedural states: Secondary | ICD-10-CM

## 2023-11-13 DIAGNOSIS — T380X5A Adverse effect of glucocorticoids and synthetic analogues, initial encounter: Secondary | ICD-10-CM | POA: Diagnosis not present

## 2023-11-13 DIAGNOSIS — R011 Cardiac murmur, unspecified: Secondary | ICD-10-CM | POA: Diagnosis present

## 2023-11-13 DIAGNOSIS — N1832 Chronic kidney disease, stage 3b: Secondary | ICD-10-CM | POA: Diagnosis not present

## 2023-11-13 DIAGNOSIS — R001 Bradycardia, unspecified: Secondary | ICD-10-CM

## 2023-11-13 DIAGNOSIS — E611 Iron deficiency: Secondary | ICD-10-CM | POA: Diagnosis not present

## 2023-11-13 DIAGNOSIS — Z7984 Long term (current) use of oral hypoglycemic drugs: Secondary | ICD-10-CM

## 2023-11-13 DIAGNOSIS — Z823 Family history of stroke: Secondary | ICD-10-CM

## 2023-11-13 DIAGNOSIS — R0600 Dyspnea, unspecified: Secondary | ICD-10-CM | POA: Diagnosis not present

## 2023-11-13 DIAGNOSIS — Z8249 Family history of ischemic heart disease and other diseases of the circulatory system: Secondary | ICD-10-CM

## 2023-11-13 DIAGNOSIS — Z7901 Long term (current) use of anticoagulants: Secondary | ICD-10-CM

## 2023-11-13 DIAGNOSIS — J9601 Acute respiratory failure with hypoxia: Secondary | ICD-10-CM | POA: Diagnosis not present

## 2023-11-13 DIAGNOSIS — Z9089 Acquired absence of other organs: Secondary | ICD-10-CM

## 2023-11-13 DIAGNOSIS — G4733 Obstructive sleep apnea (adult) (pediatric): Secondary | ICD-10-CM | POA: Diagnosis present

## 2023-11-13 DIAGNOSIS — J188 Other pneumonia, unspecified organism: Secondary | ICD-10-CM

## 2023-11-13 DIAGNOSIS — I50813 Acute on chronic right heart failure: Secondary | ICD-10-CM | POA: Diagnosis not present

## 2023-11-13 DIAGNOSIS — R748 Abnormal levels of other serum enzymes: Secondary | ICD-10-CM | POA: Diagnosis present

## 2023-11-13 DIAGNOSIS — D649 Anemia, unspecified: Secondary | ICD-10-CM | POA: Diagnosis present

## 2023-11-13 DIAGNOSIS — Z79899 Other long term (current) drug therapy: Secondary | ICD-10-CM

## 2023-11-13 DIAGNOSIS — R0989 Other specified symptoms and signs involving the circulatory and respiratory systems: Secondary | ICD-10-CM | POA: Diagnosis not present

## 2023-11-13 DIAGNOSIS — R0602 Shortness of breath: Secondary | ICD-10-CM | POA: Diagnosis not present

## 2023-11-13 LAB — CBC WITH DIFFERENTIAL/PLATELET
Abs Immature Granulocytes: 0 10*3/uL (ref 0.00–0.07)
Band Neutrophils: 2 %
Basophils Absolute: 0 10*3/uL (ref 0.0–0.1)
Basophils Relative: 0 %
Eosinophils Absolute: 0 10*3/uL (ref 0.0–0.5)
Eosinophils Relative: 0 %
HCT: 30.9 % — ABNORMAL LOW (ref 39.0–52.0)
Hemoglobin: 10.2 g/dL — ABNORMAL LOW (ref 13.0–17.0)
Lymphocytes Relative: 8 %
Lymphs Abs: 3.1 10*3/uL (ref 0.7–4.0)
MCH: 29.2 pg (ref 26.0–34.0)
MCHC: 33 g/dL (ref 30.0–36.0)
MCV: 88.5 fL (ref 80.0–100.0)
Monocytes Absolute: 0.8 10*3/uL (ref 0.1–1.0)
Monocytes Relative: 2 %
Neutro Abs: 34.5 10*3/uL — ABNORMAL HIGH (ref 1.7–7.7)
Neutrophils Relative %: 88 %
Platelets: 249 10*3/uL (ref 150–400)
RBC: 3.49 MIL/uL — ABNORMAL LOW (ref 4.22–5.81)
RDW: 16 % — ABNORMAL HIGH (ref 11.5–15.5)
WBC: 38.3 10*3/uL — ABNORMAL HIGH (ref 4.0–10.5)
nRBC: 0.1 % (ref 0.0–0.2)

## 2023-11-13 LAB — RESP PANEL BY RT-PCR (RSV, FLU A&B, COVID)  RVPGX2
Influenza A by PCR: NEGATIVE
Influenza B by PCR: NEGATIVE
Resp Syncytial Virus by PCR: NEGATIVE
SARS Coronavirus 2 by RT PCR: NEGATIVE

## 2023-11-13 LAB — COMPREHENSIVE METABOLIC PANEL
ALT: 54 U/L — ABNORMAL HIGH (ref 0–44)
AST: 67 U/L — ABNORMAL HIGH (ref 15–41)
Albumin: 2.9 g/dL — ABNORMAL LOW (ref 3.5–5.0)
Alkaline Phosphatase: 175 U/L — ABNORMAL HIGH (ref 38–126)
Anion gap: 12 (ref 5–15)
BUN: 78 mg/dL — ABNORMAL HIGH (ref 8–23)
CO2: 26 mmol/L (ref 22–32)
Calcium: 8.9 mg/dL (ref 8.9–10.3)
Chloride: 98 mmol/L (ref 98–111)
Creatinine, Ser: 2.52 mg/dL — ABNORMAL HIGH (ref 0.61–1.24)
GFR, Estimated: 25 mL/min — ABNORMAL LOW (ref >60)
Glucose, Bld: 289 mg/dL — ABNORMAL HIGH (ref 70–99)
Potassium: 4.2 mmol/L (ref 3.5–5.1)
Sodium: 136 mmol/L (ref 135–145)
Total Bilirubin: 1.4 mg/dL — ABNORMAL HIGH (ref 0.0–1.2)
Total Protein: 7.5 g/dL (ref 6.5–8.1)

## 2023-11-13 LAB — I-STAT CHEM 8, ED
BUN: 69 mg/dL — ABNORMAL HIGH (ref 8–23)
Calcium, Ion: 1.14 mmol/L — ABNORMAL LOW (ref 1.15–1.40)
Chloride: 99 mmol/L (ref 98–111)
Creatinine, Ser: 2.7 mg/dL — ABNORMAL HIGH (ref 0.61–1.24)
Glucose, Bld: 293 mg/dL — ABNORMAL HIGH (ref 70–99)
HCT: 33 % — ABNORMAL LOW (ref 39.0–52.0)
Hemoglobin: 11.2 g/dL — ABNORMAL LOW (ref 13.0–17.0)
Potassium: 4 mmol/L (ref 3.5–5.1)
Sodium: 138 mmol/L (ref 135–145)
TCO2: 29 mmol/L (ref 22–32)

## 2023-11-13 LAB — URINALYSIS, ROUTINE W REFLEX MICROSCOPIC
Bilirubin Urine: NEGATIVE
Glucose, UA: >=500 mg/dL — AB
Ketones, ur: NEGATIVE mg/dL
Leukocytes,Ua: NEGATIVE
Nitrite: NEGATIVE
Protein, ur: 30 mg/dL — AB
Specific Gravity, Urine: 1.01 (ref 1.005–1.030)
pH: 5 (ref 5.0–8.0)

## 2023-11-13 LAB — LACTIC ACID, PLASMA
Lactic Acid, Venous: 1.2 mmol/L (ref 0.5–1.9)
Lactic Acid, Venous: 1.2 mmol/L (ref 0.5–1.9)

## 2023-11-13 LAB — TROPONIN I (HIGH SENSITIVITY)
Troponin I (High Sensitivity): 80 ng/L — ABNORMAL HIGH (ref <18)
Troponin I (High Sensitivity): 88 ng/L — ABNORMAL HIGH (ref <18)

## 2023-11-13 LAB — BLOOD GAS, VENOUS
Acid-Base Excess: 6.6 mmol/L — ABNORMAL HIGH (ref 0.0–2.0)
Bicarbonate: 31.9 mmol/L — ABNORMAL HIGH (ref 20.0–28.0)
Drawn by: 44828
O2 Saturation: 86.4 %
Patient temperature: 36.7
pCO2, Ven: 46 mm[Hg] (ref 44–60)
pH, Ven: 7.44 — ABNORMAL HIGH (ref 7.25–7.43)
pO2, Ven: 50 mm[Hg] — ABNORMAL HIGH (ref 32–45)

## 2023-11-13 LAB — PROTIME-INR
INR: 6.7 (ref 0.8–1.2)
Prothrombin Time: 58.3 s — ABNORMAL HIGH (ref 11.4–15.2)

## 2023-11-13 LAB — MAGNESIUM: Magnesium: 2.7 mg/dL — ABNORMAL HIGH (ref 1.7–2.4)

## 2023-11-13 LAB — BRAIN NATRIURETIC PEPTIDE: B Natriuretic Peptide: 816 pg/mL — ABNORMAL HIGH (ref 0.0–100.0)

## 2023-11-13 MED ORDER — MACITENTAN 10 MG PO TABS: 10.0000 mg | ORAL_TABLET | Freq: Every day | ORAL | Status: DC

## 2023-11-13 MED ORDER — TORSEMIDE 20 MG PO TABS
80.0000 mg | ORAL_TABLET | Freq: Every morning | ORAL | Status: DC
  Administered 2023-11-14 – 2023-11-17 (×4): 80 mg via ORAL
  Filled 2023-11-13 (×4): qty 4

## 2023-11-13 MED ORDER — TORSEMIDE 20 MG PO TABS
40.0000 mg | ORAL_TABLET | Freq: Every evening | ORAL | Status: DC
  Administered 2023-11-13 – 2023-11-16 (×4): 40 mg via ORAL
  Filled 2023-11-13 (×4): qty 2

## 2023-11-13 MED ORDER — ONDANSETRON HCL 4 MG/2ML IJ SOLN: 4.0000 mg | Freq: Four times a day (QID) | INTRAMUSCULAR | Status: DC | PRN

## 2023-11-13 MED ORDER — CHLORHEXIDINE GLUCONATE CLOTH 2 % EX PADS
6.0000 | MEDICATED_PAD | Freq: Every day | CUTANEOUS | Status: DC
  Administered 2023-11-14: 6 via TOPICAL

## 2023-11-13 MED ORDER — SODIUM CHLORIDE 0.9 % IV SOLN
500.0000 mg | INTRAVENOUS | Status: DC
  Administered 2023-11-14 – 2023-11-18 (×5): 500 mg via INTRAVENOUS
  Filled 2023-11-13 (×5): qty 5

## 2023-11-13 MED ORDER — ACETAMINOPHEN 325 MG PO TABS: 650.0000 mg | ORAL_TABLET | Freq: Four times a day (QID) | ORAL | Status: DC | PRN

## 2023-11-13 MED ORDER — ONDANSETRON HCL 4 MG PO TABS: 4.0000 mg | ORAL_TABLET | Freq: Four times a day (QID) | ORAL | Status: DC | PRN

## 2023-11-13 MED ORDER — DAPAGLIFLOZIN PROPANEDIOL 10 MG PO TABS
10.0000 mg | ORAL_TABLET | Freq: Every day | ORAL | Status: DC
  Administered 2023-11-14 – 2023-11-29 (×16): 10 mg via ORAL
  Filled 2023-11-13 (×18): qty 1

## 2023-11-13 MED ORDER — WARFARIN - PHARMACIST DOSING INPATIENT: Freq: Every day | Status: DC

## 2023-11-13 MED ORDER — IPRATROPIUM-ALBUTEROL 0.5-2.5 (3) MG/3ML IN SOLN: 3.0000 mL | Freq: Four times a day (QID) | RESPIRATORY_TRACT | Status: DC | PRN

## 2023-11-13 MED ORDER — HYDRALAZINE HCL 25 MG PO TABS
25.0000 mg | ORAL_TABLET | Freq: Two times a day (BID) | ORAL | Status: DC
  Administered 2023-11-13 – 2023-11-29 (×27): 25 mg via ORAL
  Filled 2023-11-13 (×29): qty 1

## 2023-11-13 MED ORDER — CEFTRIAXONE SODIUM 1 G IJ SOLR
1.0000 g | INTRAMUSCULAR | Status: DC
  Administered 2023-11-14 – 2023-11-18 (×5): 1 g via INTRAVENOUS
  Filled 2023-11-13 (×5): qty 10

## 2023-11-13 MED ORDER — LACTATED RINGERS IV BOLUS
500.0000 mL | Freq: Once | INTRAVENOUS | Status: AC
  Administered 2023-11-13: 500 mL via INTRAVENOUS

## 2023-11-13 MED ORDER — TADALAFIL 20 MG PO TABS
20.0000 mg | ORAL_TABLET | Freq: Two times a day (BID) | ORAL | Status: DC
  Administered 2023-11-13 – 2023-11-29 (×28): 20 mg via ORAL
  Filled 2023-11-13 (×40): qty 1

## 2023-11-13 MED ORDER — AMLODIPINE BESYLATE 5 MG PO TABS
10.0000 mg | ORAL_TABLET | Freq: Every day | ORAL | Status: DC
  Administered 2023-11-14 – 2023-11-18 (×5): 10 mg via ORAL
  Filled 2023-11-13 (×5): qty 2

## 2023-11-13 MED ORDER — SODIUM CHLORIDE 0.9 % IV SOLN
500.0000 mg | Freq: Once | INTRAVENOUS | Status: AC
  Administered 2023-11-13: 500 mg via INTRAVENOUS
  Filled 2023-11-13: qty 5

## 2023-11-13 MED ORDER — MACITENTAN 10 MG PO TABS
10.0000 mg | ORAL_TABLET | Freq: Every day | ORAL | Status: DC
  Administered 2023-11-14 – 2023-11-29 (×16): 10 mg via ORAL
  Filled 2023-11-13 (×21): qty 1

## 2023-11-13 MED ORDER — ACETAMINOPHEN 650 MG RE SUPP: 650.0000 mg | Freq: Four times a day (QID) | RECTAL | Status: DC | PRN

## 2023-11-13 MED ORDER — SODIUM CHLORIDE 0.9 % IV SOLN
1.0000 g | Freq: Once | INTRAVENOUS | Status: AC
  Administered 2023-11-13: 1 g via INTRAVENOUS
  Filled 2023-11-13: qty 10

## 2023-11-13 NOTE — ED Notes (Signed)
Pt stands at bedside with assistance to use urinal.

## 2023-11-13 NOTE — Progress Notes (Incomplete)
Monitoring by Pharmacy for Pulmonary Hypertension Treatment   Indication - Continuation of prior to admission medication   Patient is 83 y.o.  with history of PAH on chronic macitentan (OPSUMIT) PTA and will be continued while hospitalized.   Continuing this medication order as an inpatient requires that monitoring parameters per REMS requirements must be met.  Chronic therapy is under the supervision of Dr. Shirlee Latch who is enrolled in the REMS program (REMS Provider ID [id number]) and is being notified of continuation of therapy. A staff message in EPIC has been sent notifying the certified prescriber.  The patient's authorization code or REMS patient ID as provided by the REMS program is ***. Per patient report has previously been educated on Hepatotoxicity.  On admission pregnancy risk has been assessed and Patient is male - no monitoring required, and {Form of birth control:28866} has been confirmed as birth control option for this patient.  Hepatic function has been evaluated. {LFT Evaluation:28864}.     Latest Ref Rng & Units 11/13/2023   10:50 AM 08/20/2022    3:27 PM 08/13/2022    8:48 AM  Hepatic Function  Total Protein 6.5 - 8.1 g/dL 7.5  6.4  6.9   Albumin 3.5 - 5.0 g/dL 2.9  3.5  3.9   AST 15 - 41 U/L 67  27  29   ALT 0 - 44 U/L 54  18  23   Alk Phosphatase 38 - 126 U/L 175  68  77   Total Bilirubin 0.0 - 1.2 mg/dL 1.4  0.8  1.1   Bilirubin, Direct 0.0 - 0.2 mg/dL  0.3      If any question arise or pregnancy is identified during hospitalization, contact for bosentan: [phone]; macitentan: [phone]; ambrisentan: [phone].  Thank for you allowing [place] to participate in the care of this patient.  Sheppard Coil PharmD., BCPS Clinical Pharmacist 11/13/2023 4:12 PM  Guides for Male Patient:  ambrisentan (LETAIRIS), macitentan (OPSUMIT), bosentan (TRACLEER).

## 2023-11-13 NOTE — ED Provider Notes (Signed)
[place] EMERGENCY DEPARTMENT AT [facility] Provider Note   CSN: [id number] Arrival date & time: 11/13/23  1009     History  Chief Complaint  Patient presents with   hypoxia    Robert Baldwin is a 83 y.o. male.  HPI Patient presents for shortness of breath.  Medical history includes DM, HLD, HTN, atrial fibrillation, pulmonary hypertension, CHF.  For the past 2 weeks, he has had a cough, shortness of breath, and generalized weakness.  He went to his doctor's office today.  While there, he was found to have SpO2 of 70% on room air as well as soft blood pressures in the range of 100 over 30s.  He was advised to come to the ED for further evaluation.  Patient does not wear oxygen at baseline.  He was recently treated with short course of steroids.  He completed this several days ago.  Patient denies any current areas of discomfort.  Per chart review, he is on warfarin and torsemide.    Home Medications Prior to Admission medications   Medication Sig Start Date End Date Taking? Authorizing Provider  amLODipine (NORVASC) 10 MG tablet Take 1 tablet (10 mg total) by mouth daily. 05/14/23 05/13/24 Yes Laurey Morale, MD  atorvastatin (LIPITOR) 40 MG tablet Take 40 mg by mouth at bedtime.   Yes [provider]  cholecalciferol (VITAMIN D) 25 MCG (1000 UNIT) tablet Take 1,000 Units by mouth daily.   Yes [provider]  FARXIGA 10 MG TABS tablet TAKE 1 TABLET BY MOUTH DAILY 08/09/23  Yes Clegg, Amy D, NP  fish oil-omega-3 fatty acids 1000 MG capsule Take 1 g by mouth every evening.   Yes [provider]  hydrALAZINE (APRESOLINE) 25 MG tablet Take 1 tablet (25 mg total) by mouth 3 (three) times daily. Patient taking differently: Take 25 mg by mouth 2 (two) times daily. 09/18/23  Yes Jonelle Sidle, MD  macitentan (OPSUMIT) 10 MG tablet TAKE 1 TABLET BY MOUTH DAILY 05/20/23  Yes Laurey Morale, MD  Misc Natural Products (GLUCOS-CHONDROIT-MSM COMPLEX) TABS  Take 2 tablets by mouth daily.   Yes [provider]  Multiple Vitamins-Minerals (MULTIVITAMIN WITH MINERALS) tablet Take 1 tablet by mouth in the morning.  Centrum Silver   Yes [provider]  niacin 500 MG tablet Take 500 mg by mouth in the morning.   Yes [provider]  potassium chloride (KLOR-CON M) 10 MEQ tablet Take 10 mEq by mouth daily. 10/18/23  Yes [provider]  tadalafil (CIALIS) 20 MG tablet TAKE 2 TABLETS BY MOUTH DAILY Patient taking differently: Take 20 mg by mouth 2 (two) times daily. 05/13/23  Yes Laurey Morale, MD  torsemide (DEMADEX) 20 MG tablet Take 4 tablets (80 mg total) by mouth every morning AND 2 tablets (40 mg total) every evening. 07/23/23  Yes Milford, Anderson Malta, FNP  warfarin (COUMADIN) 5 MG tablet Take 5-7.5 mg by mouth See admin instructions. Take 5 mg on Saturday,Sunday Tues, Wed, Thursday, and then  Take 7.5 mg on Monday and Friday   Yes [provider]  benzonatate (TESSALON) 200 MG capsule Take 200 mg by mouth 3 (three) times daily as needed. Patient not taking: Reported on 11/13/2023 11/05/23   [provider]  predniSONE (STERAPRED UNI-PAK 21 TAB) 5 MG (21) TBPK tablet Take by mouth as directed. Patient not taking: Reported on 11/13/2023 11/05/23   [provider]      Allergies  Other    Review of Systems   Review of Systems  Constitutional:  Positive for fatigue.  Respiratory:  Positive for cough and shortness of breath.   Neurological:  Positive for weakness (Generalized).  All other systems reviewed and are negative.   Physical Exam Updated Vital Signs BP (!) 145/45   Pulse (!) 55   Temp 98.5 F (36.9 C) (Oral)   Resp 18   Ht 5\' 7"  (1.702 m)   Wt 80 kg   SpO2 98%   BMI 27.62 kg/m  Physical Exam Vitals and nursing note reviewed.  Constitutional:      General: He is not in acute distress.    Appearance: Normal appearance. He is well-developed. He is ill-appearing. He  is not toxic-appearing or diaphoretic.  HENT:     Head: Normocephalic and atraumatic.     Right Ear: External ear normal.     Left Ear: External ear normal.     Nose: Nose normal.     Mouth/Throat:     Mouth: Mucous membranes are moist.  Eyes:     Extraocular Movements: Extraocular movements intact.     Conjunctiva/sclera: Conjunctivae normal.  Cardiovascular:     Rate and Rhythm: Normal rate and regular rhythm.     Heart sounds: No murmur heard. Pulmonary:     Effort: Pulmonary effort is normal. Tachypnea present. No respiratory distress.     Breath sounds: Rhonchi and rales present.  Abdominal:     Palpations: Abdomen is soft.     Tenderness: There is no abdominal tenderness.  Musculoskeletal:        General: No swelling or deformity.     Cervical back: Normal range of motion and neck supple.     Right lower leg: Edema present.     Left lower leg: Edema present.  Skin:    General: Skin is warm and dry.     Coloration: Skin is not jaundiced.  Neurological:     General: No focal deficit present.     Mental Status: He is alert and oriented to person, place, and time.  Psychiatric:        Mood and Affect: Mood normal.        Behavior: Behavior normal.     ED Results / Procedures / Treatments   Labs (all labs ordered are listed, but only abnormal results are displayed) Labs Reviewed  COMPREHENSIVE METABOLIC PANEL - Abnormal; Notable for the following components:      Result Value   Glucose, Bld 289 (*)    BUN 78 (*)    Creatinine, Ser 2.52 (*)    Albumin 2.9 (*)    AST 67 (*)    ALT 54 (*)    Alkaline Phosphatase 175 (*)    Total Bilirubin 1.4 (*)    GFR, Estimated 25 (*)    All other components within normal limits  BRAIN NATRIURETIC PEPTIDE - Abnormal; Notable for the following components:   B Natriuretic Peptide 816.0 (*)    All other components within normal limits  BLOOD GAS, VENOUS - Abnormal; Notable for the following components:   pH, Ven 7.44 (*)     pO2, Ven 50 (*)    Bicarbonate 31.9 (*)    Acid-Base Excess 6.6 (*)    All other components within normal limits  CBC WITH DIFFERENTIAL/PLATELET - Abnormal; Notable for the following components:   WBC 38.3 (*)    RBC 3.49 (*)    Hemoglobin 10.2 (*)  HCT 30.9 (*)    RDW 16.0 (*)    Neutro Abs 34.5 (*)    All other components within normal limits  URINALYSIS, ROUTINE W REFLEX MICROSCOPIC - Abnormal; Notable for the following components:   APPearance HAZY (*)    Glucose, UA >=500 (*)    Hgb urine dipstick SMALL (*)    Protein, ur 30 (*)    Bacteria, UA RARE (*)    All other components within normal limits  MAGNESIUM - Abnormal; Notable for the following components:   Magnesium 2.7 (*)    All other components within normal limits  PROTIME-INR - Abnormal; Notable for the following components:   Prothrombin Time 58.3 (*)    INR 6.7 (*)    All other components within normal limits  I-STAT CHEM 8, ED - Abnormal; Notable for the following components:   BUN 69 (*)    Creatinine, Ser 2.70 (*)    Glucose, Bld 293 (*)    Calcium, Ion 1.14 (*)    Hemoglobin 11.2 (*)    HCT 33.0 (*)    All other components within normal limits  TROPONIN I (HIGH SENSITIVITY) - Abnormal; Notable for the following components:   Troponin I (High Sensitivity) 80 (*)    All other components within normal limits  TROPONIN I (HIGH SENSITIVITY) - Abnormal; Notable for the following components:   Troponin I (High Sensitivity) 88 (*)    All other components within normal limits  RESP PANEL BY RT-PCR (RSV, FLU A&B, COVID)  [id number]  CULTURE, BLOOD (ROUTINE X 2)  CULTURE, BLOOD (ROUTINE X 2)  LACTIC ACID, PLASMA  LACTIC ACID, PLASMA    EKG EKG Interpretation Date/Time:  Wednesday November 13 2023 10:24:13 EST Ventricular Rate:  60 PR Interval:    QRS Duration:  150 QT Interval:  445 QTC Calculation: 449 R Axis:   119  Text Interpretation: Atrial fibrillation Right bundle branch block Confirmed by Gloris Manchester ([id number]) on 11/13/2023 10:40:36 AM  Radiology CT Chest Wo Contrast Result Date: 11/13/2023 CLINICAL DATA:  Respiratory illness.  Hypoxia. EXAM: CT CHEST WITHOUT CONTRAST TECHNIQUE: Multidetector CT imaging of the chest was performed following the standard protocol without IV contrast. RADIATION DOSE REDUCTION: This exam was performed according to the departmental dose-optimization program which includes automated exposure control, adjustment of the mA and/or kV according to patient size and/or use of iterative reconstruction technique. COMPARISON:  Chest x-ray from same day. CT chest dated May 03, 2022. FINDINGS: Cardiovascular: Unchanged cardiomegaly and small pericardial effusion. No thoracic aortic aneurysm. Coronary, aortic arch, and branch vessel atherosclerotic vascular disease. Mediastinum/Nodes: Prominent mediastinal lymph nodes measuring up to 1.4 cm in short axis, increased in size since the prior study. No enlarged axillary lymph nodes. Thyroid gland, trachea, and esophagus demonstrate no significant findings. Lungs/Pleura: Trace bilateral pleural effusions, slightly larger on the left. Extensive and patchy consolidative opacities throughout both lungs. No pneumothorax. Upper Abdomen: No acute abnormality. Unchanged nodular liver contour. Musculoskeletal: No acute or significant osseous findings. IMPRESSION: 1. Extensive and patchy consolidative opacities throughout both lungs, consistent with multifocal pneumonia. 2. Trace bilateral pleural effusions. 3. Unchanged cardiomegaly and small pericardial effusion. 4.  Aortic Atherosclerosis (ICD10-I70.0). Electronically Signed   By: Obie Dredge M.D.   On: 11/13/2023 13:07   DG Chest Port 1 View Result Date: 11/13/2023 CLINICAL DATA:  Shortness of breath with hypoxia and low blood pressure. EXAM: PORTABLE CHEST 1 VIEW COMPARISON:  05/01/2022 FINDINGS: Low volume film. The cardio pericardial silhouette is enlarged.  Diffuse interstitial opacity is  similar to mildly progressive in the interval and a component of interstitial edema is not excluded. Probable tiny bilateral pleural effusions. No acute bony abnormality. Telemetry leads overlie the chest. IMPRESSION: Low volume film with diffuse interstitial opacity, similar to mildly progressive in the interval. A component of interstitial edema is not excluded. Electronically Signed   By: Kennith Center M.D.   On: 11/13/2023 11:15    Procedures Procedures    Medications Ordered in ED Medications  ipratropium-albuterol (DUONEB) 0.5-2.5 (3) MG/3ML nebulizer solution 3 mL (has no administration in time range)  Warfarin - Pharmacist Dosing Inpatient (0 each Does not apply Hold 11/13/23 1600)  azithromycin (ZITHROMAX) 500 mg in sodium chloride 0.9 % 250 mL IVPB (has no administration in time range)  cefTRIAXone (ROCEPHIN) 1 g in sodium chloride 0.9 % 100 mL IVPB (has no administration in time range)  amLODipine (NORVASC) tablet 10 mg (has no administration in time range)  hydrALAZINE (APRESOLINE) tablet 25 mg (has no administration in time range)  macitentan (OPSUMIT) tablet 10 mg (has no administration in time range)  tadalafil (CIALIS) tablet 20 mg (has no administration in time range)  torsemide (DEMADEX) tablet 80 mg (has no administration in time range)    And  torsemide (DEMADEX) tablet 40 mg (has no administration in time range)  dapagliflozin propanediol (FARXIGA) tablet 10 mg (has no administration in time range)  acetaminophen (TYLENOL) tablet 650 mg (has no administration in time range)    Or  acetaminophen (TYLENOL) suppository 650 mg (has no administration in time range)  ondansetron (ZOFRAN) tablet 4 mg (has no administration in time range)    Or  ondansetron (ZOFRAN) injection 4 mg (has no administration in time range)  cefTRIAXone (ROCEPHIN) 1 g in sodium chloride 0.9 % 100 mL IVPB (0 g Intravenous Stopped 11/13/23 1154)  azithromycin (ZITHROMAX) 500 mg in sodium chloride 0.9 %  250 mL IVPB (0 mg Intravenous Stopped 11/13/23 1231)  lactated ringers bolus 500 mL (500 mLs Intravenous Bolus 11/13/23 1251)    ED Course/ Medical Decision Making/ A&P                                 Medical Decision Making Amount and/or Complexity of Data Reviewed Labs: ordered. Radiology: ordered.  Risk Decision regarding hospitalization.   This patient presents to the ED for concern of shortness of breath, this involves an extensive number of treatment options, and is a complaint that carries with it a high risk of complications and morbidity.  The differential diagnosis includes DHF, reactive airway disease, pneumonia, pleural effusion, ACS, valvular dysfunction, acidosis, anemia   Co morbidities that complicate the patient evaluation  DM, HLD, HTN, atrial fibrillation, pulmonary hypertension, CHF   Additional history obtained:  Additional history obtained from EMS, patient's family External records from outside source obtained and reviewed including EMR   Lab Tests:  I Ordered, and personally interpreted labs.  The pertinent results include: Baseline anemia, large leukocytosis, supratherapeutic INR, elevated BNP, creatinine and BUN slightly increased from baseline, normal lactate   Imaging Studies ordered:  I ordered imaging studies including chest x-ray, CT chest I independently visualized and interpreted imaging which showed multifocal pneumonia I agree with the radiologist interpretation   Cardiac Monitoring: / EKG:  The patient was maintained on a cardiac monitor.  I personally viewed and interpreted the cardiac monitored which showed an underlying rhythm of: Sinus rhythm  Problem List / ED Course / Critical interventions / Medication management  Patient presenting for cough, shortness of breath, and generalized weakness, worsening over the past 2 weeks.  At PCPs office today, found to be hypoxic and hypotensive.  On arrival in the ED, patient is tachypneic  with mildly increased work of breathing.  Crackles and rhonchi are appreciated on lung auscultation.  He has bilateral lower extremity edema, greater on the left.  SpO2 on room air was found to be 60%.  He was placed on 10 L of supplemental oxygen with normalization of SpO2.  Workup was initiated.  Chest x-ray shows evidence of multifocal pneumonia.  Antibiotics were initiated.  CT was ordered to further evaluate.  Lab work notable for large leukocytosis, supratherapeutic INR, and elevated BNP.  Patient denies any recent bleeding.  His hemoglobin is baseline.  No reversal indicated at this time.  Patient was admitted for further management. I ordered medication including ceftriaxone and azithromycin for pneumonia; IVF for hydration Reevaluation of the patient after these medicines showed that the patient improved I have reviewed the patients home medicines and have made adjustments as needed   Social Determinants of Health:  Has access to outpatient care   CRITICAL CARE Performed by: Gloris Manchester   Total critical care time: 34 minutes  Critical care time was exclusive of separately billable procedures and treating other patients.  Critical care was necessary to treat or prevent imminent or life-threatening deterioration.  Critical care was time spent personally by me on the following activities: development of treatment plan with patient and/or surrogate as well as nursing, discussions with consultants, evaluation of patient's response to treatment, examination of patient, obtaining history from patient or surrogate, ordering and performing treatments and interventions, ordering and review of laboratory studies, ordering and review of radiographic studies, pulse oximetry and re-evaluation of patient's condition.        Final Clinical Impression(s) / ED Diagnoses Final diagnoses:  Multifocal pneumonia  Acute respiratory failure with hypoxia [facility])    Rx / DC Orders ED Discharge Orders      None         Gloris Manchester, MD 11/13/23 1649

## 2023-11-13 NOTE — Progress Notes (Signed)
PHARMACY - ANTICOAGULATION CONSULT NOTE  Pharmacy Consult for warfarin Indication: atrial fibrillation  Allergies  Allergen Reactions   Other Rash    Bandaids    Patient Measurements: Height: 5\' 7"  (170.2 cm) Weight: 80 kg (176 lb 5.9 oz) IBW/kg (Calculated) : 66.1 Heparin Dosing Weight:   Vital Signs: Temp: 98.1 F (36.7 C) (01/22 1019) Temp Source: Oral (01/22 1019) BP: 151/45 (01/22 1110) Pulse Rate: 57 (01/22 1110)  Labs: Recent Labs    11/13/23 1049 11/13/23 1050  HGB 11.2* 10.2*  HCT 33.0* 30.9*  PLT  --  249  LABPROT  --  58.3*  INR  --  6.7*  CREATININE 2.70* 2.52*  TROPONINIHS  --  80*    Estimated Creatinine Clearance: 22.9 mL/min (A) (by C-G formula based on SCr of 2.52 mg/dL (H)).   Medical History: Past Medical History:  Diagnosis Date   (HFpEF) heart failure with preserved ejection fraction (HCC)    Arthritis    Atrial fibrillation (HCC)    Bradycardia    Carotid artery disease (HCC)    Hyperlipidemia    Hypertension    Pulmonary hypertension (HCC)    Type 2 diabetes mellitus (HCC)     Assessment: 83 year old male with known afib on chronic warfarin therapy. INR elevated at 6.7. Hemoglobin 10.2, no bleeding issues noted. Will hold warfarin until INR trends down.   Goal of Therapy:  INR 2-3 Monitor platelets by anticoagulation protocol: Yes   Plan:  Hold warfarin for now Daily INR  Sheppard Coil PharmD., BCPS Clinical Pharmacist 11/13/2023 1:38 PM

## 2023-11-13 NOTE — Progress Notes (Signed)
Monitoring by Pharmacy for Pulmonary Hypertension Treatment   Indication - Continuation of prior to admission medication   Patient is 83 y.o.  with history of PAH on chronic macitentan (OPSUMIT) PTA and will be continued while hospitalized.   Continuing this medication order as an inpatient requires that monitoring parameters per REMS requirements must be met.  Chronic therapy is under the supervision of Dr. Shirlee Latch who is enrolled in the REMS program (REMS Provider ID [phone]) and is being notified of continuation of therapy. A staff message in EPIC has been sent notifying the certified prescriber.   The patient's authorization code or REMS patient ID as provided by the REMS program is N/A. Males are not required to enroll.  Per patient report has previously been educated on Hepatotoxicity.   On admission pregnancy risk has been assessed and Patient is male - no monitoring required,   Hepatic function has been evaluated. AST / ALT appropriate to continue medication at this time.     Latest Ref Rng & Units 11/13/2023   10:50 AM 08/20/2022    3:27 PM 08/13/2022    8:48 AM  Hepatic Function  Total Protein 6.5 - 8.1 g/dL 7.5  6.4  6.9   Albumin 3.5 - 5.0 g/dL 2.9  3.5  3.9   AST 15 - 41 U/L 67  27  29   ALT 0 - 44 U/L 54  18  23   Alk Phosphatase 38 - 126 U/L 175  68  77   Total Bilirubin 0.0 - 1.2 mg/dL 1.4  0.8  1.1   Bilirubin, Direct 0.0 - 0.2 mg/dL  0.3      If any question arise or pregnancy is identified during hospitalization, contact for bosentan: [phone]; macitentan: [phone]; ambrisentan: [phone].  Thank for you allowing [place] to participate in the care of this patient.  Judeth Cornfield, PharmD Clinical Pharmacist 11/13/2023 5:00 PM   Guides for Male Patient:  ambrisentan (LETAIRIS), macitentan (OPSUMIT), bosentan (TRACLEER).

## 2023-11-13 NOTE — H&P (Signed)
History and Physical    CLIFFTON SWING WNU:[id number] DOB: 01-Sep-1941 DOA: 11/13/2023  PCP: Kirstie Peri, MD   Patient coming from: Home  Chief Complaint: Worsening hypoxemia and hypotension  HPI: Robert Baldwin is a 83 y.o. male with medical history significant for type 2 diabetes, hypertension, atrial fibrillation on Coumadin, dyslipidemia, HFpEF, pulmonary hypertension, and CKD stage IIIb-IV, who presented to the ED with worsening cough and shortness of breath along with generalized weakness.  He apparently went to his PCP office today and was found to have O2 saturation of 70% on room air as well as soft blood pressures and was advised to come to the ED for further evaluation.  He apparently went to his PCP about a week ago and at that time was placed on some prednisone which did not help.  He denies any current discomfort and states that he is trying to cough, but cannot get anything up.  He has had some intermittent fevers and chills according to the wife at bedside.  He continues to take his medications as otherwise prescribed.   ED Course: Vital signs stable and patient is afebrile.  Pulse oximetry demonstrated low O2 saturations and therefore he was placed on 10 L nasal cannula oxygen.  Leukocytosis of 38,300 and hemoglobin 10.2 with creatinine 2.52.  INR 6.7 and troponin 80 with follow-up of 88.  BNP 816.  Creatinine 2.52 and near baseline.  Flu and COVID testing negative and urine analysis with no significant sign of UTI.  He was started on azithromycin and Rocephin and CT chest demonstrates multifocal pneumonia.  Review of Systems: Reviewed as noted above, otherwise negative.  Past Medical History:  Diagnosis Date   (HFpEF) heart failure with preserved ejection fraction (HCC)    Arthritis    Atrial fibrillation (HCC)    Bradycardia    Carotid artery disease (HCC)    Hyperlipidemia    Hypertension    Pulmonary hypertension (HCC)    Type 2 diabetes mellitus (HCC)     Past Surgical  History:  Procedure Laterality Date   Cataract surgery     COLONOSCOPY     HERNIA REPAIR     RIGHT HEART CATH N/A 05/28/2022   Procedure: RIGHT HEART CATH;  Surgeon: Laurey Morale, MD;  Location: [facility] INVASIVE CV LAB;  Service: Cardiovascular;  Laterality: N/A;   RIGHT HEART CATH N/A 05/21/2023   Procedure: RIGHT HEART CATH;  Surgeon: Laurey Morale, MD;  Location: [facility] INVASIVE CV LAB;  Service: Cardiovascular;  Laterality: N/A;   RIGHT/LEFT HEART CATH AND CORONARY ANGIOGRAPHY N/A 04/30/2022   Procedure: RIGHT/LEFT HEART CATH AND CORONARY ANGIOGRAPHY;  Surgeon: Yvonne Kendall, MD;  Location: [facility] INVASIVE CV LAB;  Service: Cardiovascular;  Laterality: N/A;   TONSILLECTOMY     TRANSCAROTID ARTERY REVASCULARIZATION  Left 07/02/2022   Procedure: Transcarotid Artery Revascularization;  Surgeon: Cephus Shelling, MD;  Location: [facility] OR;  Service: Vascular;  Laterality: Left;   TRANSCAROTID ARTERY REVASCULARIZATION  Right 08/20/2022   Procedure: Right Transcarotid Artery Revascularization;  Surgeon: Cephus Shelling, MD;  Location: [facility] OR;  Service: Vascular;  Laterality: Right;   ULTRASOUND GUIDANCE FOR VASCULAR ACCESS Right 07/02/2022   Procedure: ULTRASOUND GUIDANCE FOR VASCULAR ACCESS;  Surgeon: Cephus Shelling, MD;  Location: [facility] OR;  Service: Vascular;  Laterality: Right;   ULTRASOUND GUIDANCE FOR VASCULAR ACCESS Left 08/20/2022   Procedure: ULTRASOUND GUIDANCE FOR VASCULAR ACCESS;  Surgeon: Cephus Shelling, MD;  Location: [facility] OR;  Service: Vascular;  Laterality:  Left;   YAG LASER APPLICATION Right 08/17/2016   Procedure: YAG LASER APPLICATION;  Surgeon: Susa Simmonds, MD;  Location: [facility] ORS;  Service: Ophthalmology;  Laterality: Right;     reports that he has never smoked. He has never been exposed to tobacco smoke. He has never used smokeless tobacco. He reports that he does not drink alcohol and does not use drugs.  Allergies  Allergen Reactions   Other Rash     Bandaids    Family History  Problem Relation Age of Onset   Stroke Other    Heart disease Other     Prior to Admission medications   Medication Sig Start Date End Date Taking? Authorizing Provider  amLODipine (NORVASC) 10 MG tablet Take 1 tablet (10 mg total) by mouth daily. 05/14/23 05/13/24 Yes Laurey Morale, MD  atorvastatin (LIPITOR) 40 MG tablet Take 40 mg by mouth at bedtime.   Yes [provider]  cholecalciferol (VITAMIN D) 25 MCG (1000 UNIT) tablet Take 1,000 Units by mouth daily.   Yes [provider]  FARXIGA 10 MG TABS tablet TAKE 1 TABLET BY MOUTH DAILY 08/09/23  Yes Clegg, Amy D, NP  fish oil-omega-3 fatty acids 1000 MG capsule Take 1 g by mouth every evening.   Yes [provider]  hydrALAZINE (APRESOLINE) 25 MG tablet Take 1 tablet (25 mg total) by mouth 3 (three) times daily. Patient taking differently: Take 25 mg by mouth 2 (two) times daily. 09/18/23  Yes Jonelle Sidle, MD  macitentan (OPSUMIT) 10 MG tablet TAKE 1 TABLET BY MOUTH DAILY 05/20/23  Yes Laurey Morale, MD  Misc Natural Products (GLUCOS-CHONDROIT-MSM COMPLEX) TABS Take 2 tablets by mouth daily.   Yes [provider]  Multiple Vitamins-Minerals (MULTIVITAMIN WITH MINERALS) tablet Take 1 tablet by mouth in the morning.  Centrum Silver   Yes [provider]  niacin 500 MG tablet Take 500 mg by mouth in the morning.   Yes [provider]  potassium chloride (KLOR-CON M) 10 MEQ tablet Take 10 mEq by mouth daily. 10/18/23  Yes [provider]  tadalafil (CIALIS) 20 MG tablet TAKE 2 TABLETS BY MOUTH DAILY Patient taking differently: Take 20 mg by mouth 2 (two) times daily. 05/13/23  Yes Laurey Morale, MD  torsemide (DEMADEX) 20 MG tablet Take 4 tablets (80 mg total) by mouth every morning AND 2 tablets (40 mg total) every evening. 07/23/23  Yes Milford, Anderson Malta, FNP  warfarin (COUMADIN) 5 MG tablet Take 5-7.5 mg by mouth See admin instructions.  Take 5 mg on Saturday,Sunday Tues, Wed, Thursday, and then  Take 7.5 mg on Monday and Friday   Yes [provider]  benzonatate (TESSALON) 200 MG capsule Take 200 mg by mouth 3 (three) times daily as needed. Patient not taking: Reported on 11/13/2023 11/05/23   [provider]  predniSONE (STERAPRED UNI-PAK 21 TAB) 5 MG (21) TBPK tablet Take by mouth as directed. Patient not taking: Reported on 11/13/2023 11/05/23   [provider]    Physical Exam: Vitals:   11/13/23 1445 11/13/23 1500 11/13/23 1515 11/13/23 1530  BP: (!) 156/58 (!) 151/50 (!) 144/47 (!) 136/44  Pulse: (!) 58 (!) 58 (!) 58 (!) 54  Resp: (!) 25 (!) 24 (!) 25 (!) 21  Temp:      TempSrc:      SpO2: 99% 100% 99% 99%  Weight:      Height:        Constitutional: NAD,  calm, comfortable Vitals:   11/13/23 1445 11/13/23 1500 11/13/23 1515 11/13/23 1530  BP: (!) 156/58 (!) 151/50 (!) 144/47 (!) 136/44  Pulse: (!) 58 (!) 58 (!) 58 (!) 54  Resp: (!) 25 (!) 24 (!) 25 (!) 21  Temp:      TempSrc:      SpO2: 99% 100% 99% 99%  Weight:      Height:       Eyes: lids and conjunctivae normal Neck: normal, supple Respiratory: Wheezing noted bilaterally. Normal respiratory effort. No accessory muscle use.  Currently on nonrebreather mask. Cardiovascular: Regular rate and rhythm, no murmurs. Abdomen: no tenderness, no distention. Bowel sounds positive.  Musculoskeletal:  No edema. Skin: no rashes, lesions, ulcers.  Psychiatric: Flat affect  Labs on Admission: I have personally reviewed following labs and imaging studies  CBC: Recent Labs  Lab 11/13/23 1049 11/13/23 1050  WBC  --  38.3*  NEUTROABS  --  34.5*  HGB 11.2* 10.2*  HCT 33.0* 30.9*  MCV  --  88.5  PLT  --  249   Basic Metabolic Panel: Recent Labs  Lab 11/13/23 1049 11/13/23 1050  NA 138 136  K 4.0 4.2  CL 99 98  CO2  --  26  GLUCOSE 293* 289*  BUN 69* 78*  CREATININE 2.70* 2.52*  CALCIUM  --  8.9  MG  --  2.7*    GFR: Estimated Creatinine Clearance: 22.9 mL/min (A) (by C-G formula based on SCr of 2.52 mg/dL (H)). Liver Function Tests: Recent Labs  Lab 11/13/23 1050  AST 67*  ALT 54*  ALKPHOS 175*  BILITOT 1.4*  PROT 7.5  ALBUMIN 2.9*   No results for input(s): "LIPASE", "AMYLASE" in the last 168 hours. No results for input(s): "AMMONIA" in the last 168 hours. Coagulation Profile: Recent Labs  Lab 11/13/23 1050  INR 6.7*   Cardiac Enzymes: No results for input(s): "CKTOTAL", "CKMB", "CKMBINDEX", "TROPONINI" in the last 168 hours. BNP (last 3 results) No results for input(s): "PROBNP" in the last 8760 hours. HbA1C: No results for input(s): "HGBA1C" in the last 72 hours. CBG: No results for input(s): "GLUCAP" in the last 168 hours. Lipid Profile: No results for input(s): "CHOL", "HDL", "LDLCALC", "TRIG", "CHOLHDL", "LDLDIRECT" in the last 72 hours. Thyroid Function Tests: No results for input(s): "TSH", "T4TOTAL", "FREET4", "T3FREE", "THYROIDAB" in the last 72 hours. Anemia Panel: No results for input(s): "VITAMINB12", "FOLATE", "FERRITIN", "TIBC", "IRON", "RETICCTPCT" in the last 72 hours. Urine analysis:    Component Value Date/Time   COLORURINE YELLOW 11/13/2023 1210   APPEARANCEUR HAZY (A) 11/13/2023 1210   LABSPEC 1.010 11/13/2023 1210   PHURINE 5.0 11/13/2023 1210   GLUCOSEU >=500 (A) 11/13/2023 1210   HGBUR SMALL (A) 11/13/2023 1210   BILIRUBINUR NEGATIVE 11/13/2023 1210   KETONESUR NEGATIVE 11/13/2023 1210   PROTEINUR 30 (A) 11/13/2023 1210   NITRITE NEGATIVE 11/13/2023 1210   LEUKOCYTESUR NEGATIVE 11/13/2023 1210    Radiological Exams on Admission: CT Chest Wo Contrast Result Date: 11/13/2023 CLINICAL DATA:  Respiratory illness.  Hypoxia. EXAM: CT CHEST WITHOUT CONTRAST TECHNIQUE: Multidetector CT imaging of the chest was performed following the standard protocol without IV contrast. RADIATION DOSE REDUCTION: This exam was performed according to the departmental  dose-optimization program which includes automated exposure control, adjustment of the mA and/or kV according to patient size and/or use of iterative reconstruction technique. COMPARISON:  Chest x-ray from same day. CT chest dated May 03, 2022. FINDINGS: Cardiovascular: Unchanged cardiomegaly and small pericardial effusion. No  thoracic aortic aneurysm. Coronary, aortic arch, and branch vessel atherosclerotic vascular disease. Mediastinum/Nodes: Prominent mediastinal lymph nodes measuring up to 1.4 cm in short axis, increased in size since the prior study. No enlarged axillary lymph nodes. Thyroid gland, trachea, and esophagus demonstrate no significant findings. Lungs/Pleura: Trace bilateral pleural effusions, slightly larger on the left. Extensive and patchy consolidative opacities throughout both lungs. No pneumothorax. Upper Abdomen: No acute abnormality. Unchanged nodular liver contour. Musculoskeletal: No acute or significant osseous findings. IMPRESSION: 1. Extensive and patchy consolidative opacities throughout both lungs, consistent with multifocal pneumonia. 2. Trace bilateral pleural effusions. 3. Unchanged cardiomegaly and small pericardial effusion. 4.  Aortic Atherosclerosis (ICD10-I70.0). Electronically Signed   By: Obie Dredge M.D.   On: 11/13/2023 13:07   DG Chest Port 1 View Result Date: 11/13/2023 CLINICAL DATA:  Shortness of breath with hypoxia and low blood pressure. EXAM: PORTABLE CHEST 1 VIEW COMPARISON:  05/01/2022 FINDINGS: Low volume film. The cardio pericardial silhouette is enlarged. Diffuse interstitial opacity is similar to mildly progressive in the interval and a component of interstitial edema is not excluded. Probable tiny bilateral pleural effusions. No acute bony abnormality. Telemetry leads overlie the chest. IMPRESSION: Low volume film with diffuse interstitial opacity, similar to mildly progressive in the interval. A component of interstitial edema is not excluded.  Electronically Signed   By: Kennith Center M.D.   On: 11/13/2023 11:15    EKG: Independently reviewed.  60 bpm with RBBB.  Assessment/Plan Principal Problem:   Acute hypoxemic respiratory failure (HCC) Active Problems:   Mixed hyperlipidemia   Essential hypertension, benign   Atrial fibrillation with slow ventricular response (HCC)   Severe pulmonary hypertension (HCC)    Acute hypoxemic respiratory failure secondary to multifocal CAP -Continue on azithromycin and Rocephin and monitor CBC -Monitor cultures -Wean oxygen as tolerated -Respiratory viral panel ordered and pending, flu and COVID and RSV negative  HFpEF with RV dysfunction -2D echocardiogram 04/2022 EF 70-75% -Continue tadalafil and Opsumit -Continue Demadex -Farxiga  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 07/2022 -Continue Lipitor  Type 2 diabetes -Continue SGLT2i  Permanent atrial fibrillation -Bradycardic and not on AV nodal blockade agents -Currently on Coumadin with supratherapeutic INR -Hold Coumadin for now, appreciate pharmacy assistance  OSA on CPAP -Continue CPAP at night  CKD stage IIIb-4 -Baseline creatinine around 1.8  Dyslipidemia -Continue Lipitor  Hypertension -Continue hydralazine and Norvasc   DVT prophylaxis: SCDs, hold warfarin for now Code Status: Full Family Communication: Wife at bedside 1/22 Disposition Plan: Admit for treatment of pneumonia Consults called: None Admission status: Inpatient, SDU  Severity of Illness: The appropriate patient status for this patient is INPATIENT. Inpatient status is judged to be reasonable and necessary in order to provide the required intensity of service to ensure the patient's safety. The patient's presenting symptoms, physical exam findings, and initial radiographic and laboratory data in the context of their chronic comorbidities is felt to place them at high risk for further clinical deterioration. Furthermore, it is not  anticipated that the patient will be medically stable for discharge from the hospital within 2 midnights of admission.   * I certify that at the point of admission it is my clinical judgment that the patient will require inpatient hospital care spanning beyond 2 midnights from the point of admission due to high intensity of service, high risk for further deterioration and high frequency of surveillance required.*   Robert Baldwin D Kabeer Hoagland DO [facility]  If 7PM-7AM, please contact night-coverage [email or web address]  11/13/2023, 3:38  PM

## 2023-11-13 NOTE — ED Notes (Signed)
Blood pressure cuff had slide down pt's arm onto wrist and 1615 pressure not accurate. Bp cuff repositioned back onto upper arm. Pt states he is comfortable and states he feels better than he did this morning. Denies pain. Call light within reach, side rails up x2, bed locked and in lowest position.

## 2023-11-13 NOTE — ED Triage Notes (Signed)
Pt arrived via POV c/o SOB, hypoxia and low BP readings. EDP at bedside during Triage. Pt 60% O2 on room air. Pt placed on 10L NRB and Sats improved to 94%. Pt has persistent non-productive cough with audible wheezing.

## 2023-11-14 DIAGNOSIS — J9601 Acute respiratory failure with hypoxia: Secondary | ICD-10-CM | POA: Diagnosis not present

## 2023-11-14 LAB — COMPREHENSIVE METABOLIC PANEL
ALT: 47 U/L — ABNORMAL HIGH (ref 0–44)
AST: 53 U/L — ABNORMAL HIGH (ref 15–41)
Albumin: 2.4 g/dL — ABNORMAL LOW (ref 3.5–5.0)
Alkaline Phosphatase: 163 U/L — ABNORMAL HIGH (ref 38–126)
Anion gap: 12 (ref 5–15)
BUN: 81 mg/dL — ABNORMAL HIGH (ref 8–23)
CO2: 27 mmol/L (ref 22–32)
Calcium: 8.5 mg/dL — ABNORMAL LOW (ref 8.9–10.3)
Chloride: 98 mmol/L (ref 98–111)
Creatinine, Ser: 2.33 mg/dL — ABNORMAL HIGH (ref 0.61–1.24)
GFR, Estimated: 27 mL/min — ABNORMAL LOW (ref >60)
Glucose, Bld: 265 mg/dL — ABNORMAL HIGH (ref 70–99)
Potassium: 3.8 mmol/L (ref 3.5–5.1)
Sodium: 137 mmol/L (ref 135–145)
Total Bilirubin: 0.9 mg/dL (ref 0.0–1.2)
Total Protein: 6.5 g/dL (ref 6.5–8.1)

## 2023-11-14 LAB — CBC
HCT: 28.5 % — ABNORMAL LOW (ref 39.0–52.0)
Hemoglobin: 9.2 g/dL — ABNORMAL LOW (ref 13.0–17.0)
MCH: 28.8 pg (ref 26.0–34.0)
MCHC: 32.3 g/dL (ref 30.0–36.0)
MCV: 89.3 fL (ref 80.0–100.0)
Platelets: 242 10*3/uL (ref 150–400)
RBC: 3.19 MIL/uL — ABNORMAL LOW (ref 4.22–5.81)
RDW: 16 % — ABNORMAL HIGH (ref 11.5–15.5)
WBC: 31.6 10*3/uL — ABNORMAL HIGH (ref 4.0–10.5)
nRBC: 0.1 % (ref 0.0–0.2)

## 2023-11-14 LAB — PROTIME-INR
INR: 7.2 (ref 0.8–1.2)
Prothrombin Time: 62 s — ABNORMAL HIGH (ref 11.4–15.2)

## 2023-11-14 LAB — GLUCOSE, CAPILLARY
Glucose-Capillary: 296 mg/dL — ABNORMAL HIGH (ref 70–99)
Glucose-Capillary: 179 mg/dL — ABNORMAL HIGH (ref 70–99)
Glucose-Capillary: 151 mg/dL — ABNORMAL HIGH (ref 70–99)

## 2023-11-14 LAB — MAGNESIUM: Magnesium: 2.7 mg/dL — ABNORMAL HIGH (ref 1.7–2.4)

## 2023-11-14 LAB — HEMOGLOBIN A1C
Hgb A1c MFr Bld: 7.4 % — ABNORMAL HIGH (ref 4.8–5.6)
Mean Plasma Glucose: 165.68 mg/dL

## 2023-11-14 MED ORDER — IPRATROPIUM-ALBUTEROL 0.5-2.5 (3) MG/3ML IN SOLN
3.0000 mL | RESPIRATORY_TRACT | Status: DC | PRN
  Administered 2023-11-14 – 2023-11-15 (×2): 3 mL via RESPIRATORY_TRACT
  Filled 2023-11-14 (×2): qty 3

## 2023-11-14 MED ORDER — DM-GUAIFENESIN ER 30-600 MG PO TB12
1.0000 | ORAL_TABLET | Freq: Two times a day (BID) | ORAL | Status: DC
  Administered 2023-11-14 – 2023-11-29 (×31): 1 via ORAL
  Filled 2023-11-14 (×31): qty 1

## 2023-11-14 MED ORDER — INSULIN ASPART 100 UNIT/ML IJ SOLN
0.0000 [IU] | Freq: Every day | INTRAMUSCULAR | Status: DC
  Administered 2023-11-15: 2 [IU] via SUBCUTANEOUS
  Administered 2023-11-16 – 2023-11-17 (×2): 3 [IU] via SUBCUTANEOUS
  Administered 2023-11-18: 4 [IU] via SUBCUTANEOUS
  Administered 2023-11-19: 3 [IU] via SUBCUTANEOUS
  Administered 2023-11-23: 2 [IU] via SUBCUTANEOUS

## 2023-11-14 MED ORDER — IPRATROPIUM-ALBUTEROL 0.5-2.5 (3) MG/3ML IN SOLN: 3.0000 mL | Freq: Four times a day (QID) | RESPIRATORY_TRACT | Status: DC

## 2023-11-14 MED ORDER — INSULIN ASPART 100 UNIT/ML IJ SOLN
0.0000 [IU] | Freq: Three times a day (TID) | INTRAMUSCULAR | Status: DC
Start: 2023-11-14 — End: 2023-11-29
  Administered 2023-11-14: 2 [IU] via SUBCUTANEOUS
  Administered 2023-11-14: 5 [IU] via SUBCUTANEOUS
  Administered 2023-11-15 (×2): 2 [IU] via SUBCUTANEOUS
  Administered 2023-11-15 – 2023-11-16 (×3): 7 [IU] via SUBCUTANEOUS
  Administered 2023-11-16: 5 [IU] via SUBCUTANEOUS
  Administered 2023-11-17: 3 [IU] via SUBCUTANEOUS
  Administered 2023-11-17: 5 [IU] via SUBCUTANEOUS
  Administered 2023-11-17: 7 [IU] via SUBCUTANEOUS
  Administered 2023-11-18 (×2): 3 [IU] via SUBCUTANEOUS
  Administered 2023-11-18: 5 [IU] via SUBCUTANEOUS
  Administered 2023-11-19 (×2): 3 [IU] via SUBCUTANEOUS
  Administered 2023-11-19 – 2023-11-20 (×4): 5 [IU] via SUBCUTANEOUS
  Administered 2023-11-21: 1 [IU] via SUBCUTANEOUS
  Administered 2023-11-21: 3 [IU] via SUBCUTANEOUS
  Administered 2023-11-21 – 2023-11-22 (×2): 2 [IU] via SUBCUTANEOUS
  Administered 2023-11-22: 1 [IU] via SUBCUTANEOUS
  Administered 2023-11-22 – 2023-11-24 (×3): 3 [IU] via SUBCUTANEOUS
  Administered 2023-11-24: 1 [IU] via SUBCUTANEOUS
  Administered 2023-11-24 – 2023-11-25 (×3): 3 [IU] via SUBCUTANEOUS
  Administered 2023-11-26: 2 [IU] via SUBCUTANEOUS
  Administered 2023-11-26: 3 [IU] via SUBCUTANEOUS
  Administered 2023-11-27: 2 [IU] via SUBCUTANEOUS
  Administered 2023-11-27: 3 [IU] via SUBCUTANEOUS
  Administered 2023-11-28 – 2023-11-29 (×2): 2 [IU] via SUBCUTANEOUS

## 2023-11-14 NOTE — Progress Notes (Signed)
   11/14/23 1017  TOC Brief Assessment  Insurance and Status Reviewed  Patient has primary care physician Yes  Home environment has been reviewed from home  Prior level of function: independent  Prior/Current Home Services No current home services  Social Drivers of Health Review SDOH reviewed no interventions necessary  Readmission risk has been reviewed Yes  Transition of care needs no transition of care needs at this time    Transition of Care Department [facility]) has reviewed patient and no TOC needs have been identified at this time. We will continue to monitor patient advancement through interdisciplinary progression rounds. If new patient transition needs arise, please place a TOC consult.

## 2023-11-14 NOTE — Progress Notes (Signed)
PROGRESS NOTE    Robert Baldwin  ONG:[id number] DOB: 1941/08/31 DOA: 11/13/2023 PCP: Kirstie Peri, MD   Brief Narrative:    Robert Baldwin is a 83 y.o. male with medical history significant for type 2 diabetes, hypertension, atrial fibrillation on Coumadin, dyslipidemia, HFpEF, pulmonary hypertension, and CKD stage IIIb-IV, who presented to the ED with worsening cough and shortness of breath along with generalized weakness.  Patient was admitted with acute hypoxemic respiratory failure secondary to multifocal community-acquired pneumonia and was started on empiric antibiotics.  Assessment & Plan:   Principal Problem:   Acute hypoxemic respiratory failure (HCC) Active Problems:   Mixed hyperlipidemia   Essential hypertension, benign   Atrial fibrillation with slow ventricular response (HCC)   Severe pulmonary hypertension (HCC)  Assessment and Plan:   Acute hypoxemic respiratory failure secondary to multifocal CAP-improving -Continue on azithromycin and Rocephin and monitor CBC -Monitor cultures -Wean oxygen as tolerated -Respiratory viral panel ordered and pending, flu and COVID and RSV negative -Blood cultures negative -Okay to transfer to telemetry -Schedule some breathing treatments -Mucinex added   HFpEF with RV dysfunction -2D echocardiogram 04/2022 EF 70-75% -Continue tadalafil and Opsumit -Continue Demadex -Farxiga   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 07/2022 -Continue Lipitor   Type 2 diabetes with hyperglycemia -Continue SGLT2i -Continue carb modified diet -Add SSI   Permanent atrial fibrillation -Bradycardic and not on AV nodal blockade agents -Currently on Coumadin with supratherapeutic INR -Hold Coumadin for now, appreciate pharmacy assistance -Might need to consider vitamin K if INR continues to increase   OSA on CPAP -Continue CPAP at night   CKD stage IIIb-4 -Baseline creatinine around 1.8   Dyslipidemia -Continue Lipitor    Hypertension -Continue hydralazine and Norvasc   DVT prophylaxis: Supratherapeutic INR Code Status: Full Family Communication: None at bedside Disposition Plan:  Status is: Inpatient Remains inpatient appropriate because: Need for IV antibiotics.   Consultants:  None  Procedures:  None  Antimicrobials:  Anti-infectives (From admission, onward)    Start     Dose/Rate Route Frequency Ordered Stop   11/14/23 1100  cefTRIAXone (ROCEPHIN) 1 g in sodium chloride 0.9 % 100 mL IVPB        1 g 200 mL/hr over 30 Minutes Intravenous Every 24 hours 11/13/23 1549     11/14/23 1000  azithromycin (ZITHROMAX) 500 mg in sodium chloride 0.9 % 250 mL IVPB        500 mg 250 mL/hr over 60 Minutes Intravenous Every 24 hours 11/13/23 1549     11/13/23 1115  cefTRIAXone (ROCEPHIN) 1 g in sodium chloride 0.9 % 100 mL IVPB        1 g 200 mL/hr over 30 Minutes Intravenous  Once 11/13/23 1111 11/13/23 1154   11/13/23 1115  azithromycin (ZITHROMAX) 500 mg in sodium chloride 0.9 % 250 mL IVPB        500 mg 250 mL/hr over 60 Minutes Intravenous  Once 11/13/23 1111 11/13/23 1231      Subjective: Patient seen and evaluated today with no new acute complaints or concerns. No acute concerns or events noted overnight.  He is starting to feel better and has less of an oxygen requirement this morning.  Objective: Vitals:   11/14/23 0600 11/14/23 0756 11/14/23 0801 11/14/23 0845  BP: (!) 159/43   (!) 150/43  Pulse: (!) 48 62    Resp: 20 (!) 22    Temp:   98.4 F (36.9 C)   TempSrc:   Oral  SpO2: 95% 94%    Weight:      Height:        Intake/Output Summary (Last 24 hours) at 11/14/2023 0951 Last data filed at 11/14/2023 2355 Gross per 24 hour  Intake 600 ml  Output 775 ml  Net -175 ml   Filed Weights   11/13/23 1020  Weight: 80 kg    Examination:  General exam: Appears calm and comfortable  Respiratory system: Clear to auscultation. Respiratory effort normal.  Currently on 6 L nasal  cannula. Cardiovascular system: S1 & S2 heard, RRR.  Gastrointestinal system: Abdomen is soft Central nervous system: Alert and awake Extremities: No edema Skin: No significant lesions noted Psychiatry: Flat affect.    Data Reviewed: I have personally reviewed following labs and imaging studies  CBC: Recent Labs  Lab 11/13/23 1049 11/13/23 1050 11/14/23 0523  WBC  --  38.3* 31.6*  NEUTROABS  --  34.5*  --   HGB 11.2* 10.2* 9.2*  HCT 33.0* 30.9* 28.5*  MCV  --  88.5 89.3  PLT  --  249 242   Basic Metabolic Panel: Recent Labs  Lab 11/13/23 1049 11/13/23 1050 11/14/23 0523  NA 138 136 137  K 4.0 4.2 3.8  CL 99 98 98  CO2  --  26 27  GLUCOSE 293* 289* 265*  BUN 69* 78* 81*  CREATININE 2.70* 2.52* 2.33*  CALCIUM  --  8.9 8.5*  MG  --  2.7* 2.7*   GFR: Estimated Creatinine Clearance: 24.8 mL/min (A) (by C-G formula based on SCr of 2.33 mg/dL (H)). Liver Function Tests: Recent Labs  Lab 11/13/23 1050 11/14/23 0523  AST 67* 53*  ALT 54* 47*  ALKPHOS 175* 163*  BILITOT 1.4* 0.9  PROT 7.5 6.5  ALBUMIN 2.9* 2.4*   No results for input(s): "LIPASE", "AMYLASE" in the last 168 hours. No results for input(s): "AMMONIA" in the last 168 hours. Coagulation Profile: Recent Labs  Lab 11/13/23 1050 11/14/23 0523  INR 6.7* 7.2*   Cardiac Enzymes: No results for input(s): "CKTOTAL", "CKMB", "CKMBINDEX", "TROPONINI" in the last 168 hours. BNP (last 3 results) No results for input(s): "PROBNP" in the last 8760 hours. HbA1C: No results for input(s): "HGBA1C" in the last 72 hours. CBG: No results for input(s): "GLUCAP" in the last 168 hours. Lipid Profile: No results for input(s): "CHOL", "HDL", "LDLCALC", "TRIG", "CHOLHDL", "LDLDIRECT" in the last 72 hours. Thyroid Function Tests: No results for input(s): "TSH", "T4TOTAL", "FREET4", "T3FREE", "THYROIDAB" in the last 72 hours. Anemia Panel: No results for input(s): "VITAMINB12", "FOLATE", "FERRITIN", "TIBC", "IRON",  "RETICCTPCT" in the last 72 hours. Sepsis Labs: Recent Labs  Lab 11/13/23 1305 11/13/23 1449  LATICACIDVEN 1.2 1.2    Recent Results (from the past 240 hours)  Resp panel by RT-PCR (RSV, Flu A&B, Covid) Anterior Nasal Swab     Status: None   Collection Time: 11/13/23 10:25 AM   Specimen: Anterior Nasal Swab  Result Value Ref Range Status   SARS Coronavirus 2 by RT PCR NEGATIVE NEGATIVE Final    Comment: (NOTE) SARS-CoV-2 target nucleic acids are NOT DETECTED.  The SARS-CoV-2 RNA is generally detectable in upper respiratory specimens during the acute phase of infection. The lowest concentration of SARS-CoV-2 viral copies this assay can detect is 138 copies/mL. A negative result does not preclude SARS-Cov-2 infection and should not be used as the sole basis for treatment or other patient management decisions. A negative result may occur with  improper specimen collection/handling, submission of specimen  other than nasopharyngeal swab, presence of viral mutation(s) within the areas targeted by this assay, and inadequate number of viral copies(<138 copies/mL). A negative result must be combined with clinical observations, patient history, and epidemiological information. The expected result is Negative.  Fact Sheet for Patients:  BloggerCourse.com  Fact Sheet for Healthcare Providers:  SeriousBroker.it  This test is no t yet approved or cleared by the [place] FDA and  has been authorized for detection and/or diagnosis of SARS-CoV-2 by FDA under an Emergency Use Authorization (EUA). This EUA will remain  in effect (meaning this test can be used) for the duration of the COVID-19 declaration under Section 564(b)(1) of the Act, 21 U.S.C.section 360bbb-3(b)(1), unless the authorization is terminated  or revoked sooner.       Influenza A by PCR NEGATIVE NEGATIVE Final   Influenza B by PCR NEGATIVE NEGATIVE Final    Comment:  (NOTE) The Xpert Xpress SARS-CoV-2/FLU/RSV plus assay is intended as an aid in the diagnosis of influenza from Nasopharyngeal swab specimens and should not be used as a sole basis for treatment. Nasal washings and aspirates are unacceptable for Xpert Xpress SARS-CoV-2/FLU/RSV testing.  Fact Sheet for Patients: BloggerCourse.com  Fact Sheet for Healthcare Providers: SeriousBroker.it  This test is not yet approved or cleared by the [place] FDA and has been authorized for detection and/or diagnosis of SARS-CoV-2 by FDA under an Emergency Use Authorization (EUA). This EUA will remain in effect (meaning this test can be used) for the duration of the COVID-19 declaration under Section 564(b)(1) of the Act, 21 U.S.C. section 360bbb-3(b)(1), unless the authorization is terminated or revoked.     Resp Syncytial Virus by PCR NEGATIVE NEGATIVE Final    Comment: (NOTE) Fact Sheet for Patients: BloggerCourse.com  Fact Sheet for Healthcare Providers: SeriousBroker.it  This test is not yet approved or cleared by the [place] FDA and has been authorized for detection and/or diagnosis of SARS-CoV-2 by FDA under an Emergency Use Authorization (EUA). This EUA will remain in effect (meaning this test can be used) for the duration of the COVID-19 declaration under Section 564(b)(1) of the Act, 21 U.S.C. section 360bbb-3(b)(1), unless the authorization is terminated or revoked.  Performed at [facility], [address] [place], [place] [address]   Blood culture (routine x 2)     Status: None (Preliminary result)   Collection Time: 11/13/23  1:05 PM   Specimen: BLOOD  Result Value Ref Range Status   Specimen Description BLOOD BLOOD LEFT HAND  Final   Special Requests   Final    Blood Culture results may not be optimal due to an inadequate volume of blood received in culture bottles  BOTTLES DRAWN AEROBIC AND ANAEROBIC   Culture   Final    NO GROWTH < 24 HOURS Performed at [facility], [address], [place], [place] [address]    Report Status PENDING  Incomplete  Blood culture (routine x 2)     Status: None (Preliminary result)   Collection Time: 11/13/23  1:05 PM   Specimen: BLOOD  Result Value Ref Range Status   Specimen Description BLOOD BLOOD RIGHT HAND  Final   Special Requests   Final    Blood Culture results may not be optimal due to an inadequate volume of blood received in culture bottles BOTTLES DRAWN AEROBIC ONLY   Culture   Final    NO GROWTH < 24 HOURS Performed at [facility], [address] [place], [place] [address]  Report Status PENDING  Incomplete         Radiology Studies: CT Chest Wo Contrast Result Date: 11/13/2023 CLINICAL DATA:  Respiratory illness.  Hypoxia. EXAM: CT CHEST WITHOUT CONTRAST TECHNIQUE: Multidetector CT imaging of the chest was performed following the standard protocol without IV contrast. RADIATION DOSE REDUCTION: This exam was performed according to the departmental dose-optimization program which includes automated exposure control, adjustment of the mA and/or kV according to patient size and/or use of iterative reconstruction technique. COMPARISON:  Chest x-ray from same day. CT chest dated May 03, 2022. FINDINGS: Cardiovascular: Unchanged cardiomegaly and small pericardial effusion. No thoracic aortic aneurysm. Coronary, aortic arch, and branch vessel atherosclerotic vascular disease. Mediastinum/Nodes: Prominent mediastinal lymph nodes measuring up to 1.4 cm in short axis, increased in size since the prior study. No enlarged axillary lymph nodes. Thyroid gland, trachea, and esophagus demonstrate no significant findings. Lungs/Pleura: Trace bilateral pleural effusions, slightly larger on the left. Extensive and patchy consolidative opacities throughout both lungs. No pneumothorax. Upper Abdomen: No acute abnormality.  Unchanged nodular liver contour. Musculoskeletal: No acute or significant osseous findings. IMPRESSION: 1. Extensive and patchy consolidative opacities throughout both lungs, consistent with multifocal pneumonia. 2. Trace bilateral pleural effusions. 3. Unchanged cardiomegaly and small pericardial effusion. 4.  Aortic Atherosclerosis (ICD10-I70.0). Electronically Signed   By: Obie Dredge M.D.   On: 11/13/2023 13:07   DG Chest Port 1 View Result Date: 11/13/2023 CLINICAL DATA:  Shortness of breath with hypoxia and low blood pressure. EXAM: PORTABLE CHEST 1 VIEW COMPARISON:  05/01/2022 FINDINGS: Low volume film. The cardio pericardial silhouette is enlarged. Diffuse interstitial opacity is similar to mildly progressive in the interval and a component of interstitial edema is not excluded. Probable tiny bilateral pleural effusions. No acute bony abnormality. Telemetry leads overlie the chest. IMPRESSION: Low volume film with diffuse interstitial opacity, similar to mildly progressive in the interval. A component of interstitial edema is not excluded. Electronically Signed   By: Kennith Center M.D.   On: 11/13/2023 11:15        Scheduled Meds:  amLODipine  10 mg Oral Daily   Chlorhexidine Gluconate Cloth  6 each Topical Q0600   dapagliflozin propanediol  10 mg Oral Daily   dextromethorphan-guaiFENesin  1 tablet Oral BID   hydrALAZINE  25 mg Oral BID   ipratropium-albuterol  3 mL Nebulization Q6H   macitentan  10 mg Oral Daily   tadalafil  20 mg Oral BID   torsemide  80 mg Oral q morning   And   torsemide  40 mg Oral QPM   Warfarin - Pharmacist Dosing Inpatient   Does not apply q1600   Continuous Infusions:  azithromycin 500 mg (11/14/23 0853)   cefTRIAXone (ROCEPHIN)  IV       LOS: 1 day    Time spent: 55 minutes    Willem Klingensmith Hoover Brunette, DO [facility]  If 7PM-7AM, please contact night-coverage [email or web address] 11/14/2023, 9:51 AM

## 2023-11-14 NOTE — Progress Notes (Signed)
Critical lab value was called to RN in ED Department.  ED called to floor with critical INR value of 7.2.  Secure message sent to MD. Awaiting orders.

## 2023-11-14 NOTE — Plan of Care (Signed)
  Problem: Clinical Measurements: Goal: Respiratory complications will improve Outcome: Progressing Goal: Cardiovascular complication will be avoided Outcome: Progressing   Problem: Activity: Goal: Risk for activity intolerance will decrease Outcome: Progressing   Problem: Coping: Goal: Level of anxiety will decrease Outcome: Progressing   Problem: Elimination: Goal: Will not experience complications related to urinary retention Outcome: Progressing   Problem: Pain Managment: Goal: General experience of comfort will improve and/or be controlled Outcome: Progressing   Problem: Safety: Goal: Ability to remain free from injury will improve Outcome: Progressing

## 2023-11-14 NOTE — Progress Notes (Signed)
PHARMACY - ANTICOAGULATION CONSULT NOTE  Pharmacy Consult for warfarin Indication: atrial fibrillation  Allergies  Allergen Reactions   Other Rash    Bandaids    Patient Measurements: Height: 5\' 7"  (170.2 cm) Weight: 80 kg (176 lb 5.9 oz) IBW/kg (Calculated) : 66.1  Vital Signs: Temp: 98.4 F (36.9 C) (01/23 0801) Temp Source: Oral (01/23 0801) BP: 159/43 (01/23 0600) Pulse Rate: 62 (01/23 0756)  Labs: Recent Labs    11/13/23 1049 11/13/23 1050 11/13/23 1305 11/14/23 0523  HGB 11.2* 10.2*  --  9.2*  HCT 33.0* 30.9*  --  28.5*  PLT  --  249  --  242  LABPROT  --  58.3*  --  62.0*  INR  --  6.7*  --  7.2*  CREATININE 2.70* 2.52*  --  2.33*  TROPONINIHS  --  80* 88*  --     Estimated Creatinine Clearance: 24.8 mL/min (A) (by C-G formula based on SCr of 2.33 mg/dL (H)).   Medical History: Past Medical History:  Diagnosis Date   (HFpEF) heart failure with preserved ejection fraction (HCC)    Arthritis    Atrial fibrillation (HCC)    Bradycardia    Carotid artery disease (HCC)    Hyperlipidemia    Hypertension    Pulmonary hypertension (HCC)    Type 2 diabetes mellitus (HCC)     Assessment: 83 year old male with known afib on chronic warfarin therapy. INR elevated at 7.2. Hemoglobin trending down 10.2>9.2, no bleeding issues noted. Will hold warfarin until INR trends down. Can consider boost supplements for subtle vitamin k replenishment. Will follow CBC closely.   Goal of Therapy:  INR 2-3 Monitor platelets by anticoagulation protocol: Yes   Plan:  Hold warfarin for now Daily INR  Sheppard Coil PharmD., BCPS Clinical Pharmacist 11/14/2023 8:08 AM

## 2023-11-15 ENCOUNTER — Telehealth: Payer: Self-pay | Admitting: Cardiology

## 2023-11-15 ENCOUNTER — Inpatient Hospital Stay (HOSPITAL_COMMUNITY): Payer: Medicare PPO

## 2023-11-15 DIAGNOSIS — J9601 Acute respiratory failure with hypoxia: Secondary | ICD-10-CM | POA: Diagnosis not present

## 2023-11-15 LAB — GLUCOSE, CAPILLARY
Glucose-Capillary: 163 mg/dL — ABNORMAL HIGH (ref 70–99)
Glucose-Capillary: 199 mg/dL — ABNORMAL HIGH (ref 70–99)
Glucose-Capillary: 347 mg/dL — ABNORMAL HIGH (ref 70–99)
Glucose-Capillary: 241 mg/dL — ABNORMAL HIGH (ref 70–99)

## 2023-11-15 LAB — CBC WITH DIFFERENTIAL/PLATELET
Abs Immature Granulocytes: 0.5 10*3/uL — ABNORMAL HIGH (ref 0.00–0.07)
Band Neutrophils: 2 %
Basophils Absolute: 0 10*3/uL (ref 0.0–0.1)
Basophils Relative: 0 %
Eosinophils Absolute: 0.3 10*3/uL (ref 0.0–0.5)
Eosinophils Relative: 1 %
HCT: 27.5 % — ABNORMAL LOW (ref 39.0–52.0)
Hemoglobin: 8.8 g/dL — ABNORMAL LOW (ref 13.0–17.0)
Lymphocytes Relative: 3 %
Lymphs Abs: 0.8 10*3/uL (ref 0.7–4.0)
MCH: 28.8 pg (ref 26.0–34.0)
MCHC: 32 g/dL (ref 30.0–36.0)
MCV: 89.9 fL (ref 80.0–100.0)
Metamyelocytes Relative: 1 %
Monocytes Absolute: 1.5 10*3/uL — ABNORMAL HIGH (ref 0.1–1.0)
Monocytes Relative: 6 %
Myelocytes: 1 %
Neutro Abs: 22.4 10*3/uL — ABNORMAL HIGH (ref 1.7–7.7)
Neutrophils Relative %: 86 %
Platelets: 260 10*3/uL (ref 150–400)
RBC: 3.06 MIL/uL — ABNORMAL LOW (ref 4.22–5.81)
RDW: 15.9 % — ABNORMAL HIGH (ref 11.5–15.5)
WBC: 25.4 10*3/uL — ABNORMAL HIGH (ref 4.0–10.5)
nRBC: 0 % (ref 0.0–0.2)

## 2023-11-15 LAB — COMPREHENSIVE METABOLIC PANEL
ALT: 53 U/L — ABNORMAL HIGH (ref 0–44)
AST: 74 U/L — ABNORMAL HIGH (ref 15–41)
Albumin: 2.4 g/dL — ABNORMAL LOW (ref 3.5–5.0)
Alkaline Phosphatase: 200 U/L — ABNORMAL HIGH (ref 38–126)
Anion gap: 13 (ref 5–15)
BUN: 87 mg/dL — ABNORMAL HIGH (ref 8–23)
CO2: 24 mmol/L (ref 22–32)
Calcium: 8.3 mg/dL — ABNORMAL LOW (ref 8.9–10.3)
Chloride: 97 mmol/L — ABNORMAL LOW (ref 98–111)
Creatinine, Ser: 2.46 mg/dL — ABNORMAL HIGH (ref 0.61–1.24)
GFR, Estimated: 26 mL/min — ABNORMAL LOW (ref >60)
Glucose, Bld: 172 mg/dL — ABNORMAL HIGH (ref 70–99)
Potassium: 3.9 mmol/L (ref 3.5–5.1)
Sodium: 134 mmol/L — ABNORMAL LOW (ref 135–145)
Total Bilirubin: 0.9 mg/dL (ref 0.0–1.2)
Total Protein: 6.6 g/dL (ref 6.5–8.1)

## 2023-11-15 LAB — RETICULOCYTES
Immature Retic Fract: 39.4 % — ABNORMAL HIGH (ref 2.3–15.9)
RBC.: 3.12 MIL/uL — ABNORMAL LOW (ref 4.22–5.81)
Retic Count, Absolute: 76.4 10*3/uL (ref 19.0–186.0)
Retic Ct Pct: 2.5 % (ref 0.4–3.1)

## 2023-11-15 LAB — IRON AND TIBC
Iron: 19 ug/dL — ABNORMAL LOW (ref 45–182)
Saturation Ratios: 11 % — ABNORMAL LOW (ref 17.9–39.5)
TIBC: 181 ug/dL — ABNORMAL LOW (ref 250–450)
UIBC: 162 ug/dL

## 2023-11-15 LAB — BRAIN NATRIURETIC PEPTIDE: B Natriuretic Peptide: 586 pg/mL — ABNORMAL HIGH (ref 0.0–100.0)

## 2023-11-15 LAB — HEPATITIS PANEL, ACUTE
HCV Ab: NONREACTIVE
Hep A IgM: NONREACTIVE
Hep B C IgM: NONREACTIVE
Hepatitis B Surface Ag: NONREACTIVE

## 2023-11-15 LAB — PROTIME-INR
INR: 6.7 (ref 0.8–1.2)
Prothrombin Time: 58.8 s — ABNORMAL HIGH (ref 11.4–15.2)

## 2023-11-15 LAB — MAGNESIUM: Magnesium: 2.5 mg/dL — ABNORMAL HIGH (ref 1.7–2.4)

## 2023-11-15 LAB — FERRITIN: Ferritin: 217 ng/mL (ref 24–336)

## 2023-11-15 LAB — HEMOGLOBIN AND HEMATOCRIT, BLOOD
HCT: 26.7 % — ABNORMAL LOW (ref 39.0–52.0)
Hemoglobin: 8.6 g/dL — ABNORMAL LOW (ref 13.0–17.0)

## 2023-11-15 LAB — FOLATE: Folate: 18.9 ng/mL (ref >5.9)

## 2023-11-15 LAB — VITAMIN B12: Vitamin B-12: 1876 pg/mL — ABNORMAL HIGH (ref 180–914)

## 2023-11-15 MED ORDER — FUROSEMIDE 10 MG/ML IJ SOLN
80.0000 mg | Freq: Once | INTRAMUSCULAR | Status: AC
Start: 2023-11-15 — End: 2023-11-15
  Administered 2023-11-15: 80 mg via INTRAVENOUS
  Filled 2023-11-15: qty 8

## 2023-11-15 MED ORDER — IPRATROPIUM-ALBUTEROL 0.5-2.5 (3) MG/3ML IN SOLN
3.0000 mL | Freq: Four times a day (QID) | RESPIRATORY_TRACT | Status: DC
Start: 2023-11-15 — End: 2023-11-16
  Administered 2023-11-15 – 2023-11-16 (×7): 3 mL via RESPIRATORY_TRACT
  Filled 2023-11-15 (×7): qty 3

## 2023-11-15 MED ORDER — PHYTONADIONE 5 MG PO TABS
5.0000 mg | ORAL_TABLET | Freq: Once | ORAL | Status: AC
  Administered 2023-11-15: 5 mg via ORAL
  Filled 2023-11-15: qty 1

## 2023-11-15 MED ORDER — METHYLPREDNISOLONE SODIUM SUCC 40 MG IJ SOLR
40.0000 mg | Freq: Two times a day (BID) | INTRAMUSCULAR | Status: DC
Start: 2023-11-15 — End: 2023-11-20
  Administered 2023-11-15 – 2023-11-20 (×11): 40 mg via INTRAVENOUS
  Filled 2023-11-15 (×11): qty 1

## 2023-11-15 NOTE — Care Management Important Message (Signed)
Important Message  Patient Details  Name: Robert Baldwin MRN: [id number] Date of Birth: 1941-10-07   Important Message Given:  Yes - Medicare IM     Corey Harold 11/15/2023, 2:34 PM

## 2023-11-15 NOTE — Telephone Encounter (Signed)
Wife (Tammy) called to let Dr. Diona Browner know that patient has been admitted to [facility] with double pneumonia.

## 2023-11-15 NOTE — Progress Notes (Signed)
This nurse entered room and patient asked to stand and use urinal. With exertion, patient's O2 sat decreased to 82-83% on 8L HFNC. After sitting on edge of bed, patient unable to recover above 85%. O2 increased to 10L and Erie Noe, RRT notified and came to bedside. Sats increased to 88% and this nurse placed oxygen on 8L. After approximately 7-8 minutes, patient was able to recover to 88-89% on 8L. Upon auscultation, worsening crackles in lungs, especially in lower lobes. Dr. [name] made aware and at bedside. Patient reported mild SOB when O2 sat was at 82-85%, but no SOB with sat at 88%. Per Dr. [name], O2 sat goal is above 88%. New orders placed by Dr. [name].

## 2023-11-15 NOTE — Progress Notes (Signed)
Patient requested to use urinal standing at bedside. While on 4L HFNC standing and urinating, O2 decreased to 78%. Erie Noe, RRT in room and increase O2 to 6L. Patient's sats remained at 84-85%. Increased back to 8L where patient was able to recover to 90-92%. Patient encouraged to transfer to chair, but refused at this time. Incentive spirometer used after patient returned to sitting on the edge of bed. Verblaized understanding, but needs reinforcement on use, only able to achieve 250-544mL x10 attempts. Non-productive, but strong and congested cough remains. Patient remains sitting on edge of bed with personal belongings and call light within reach.

## 2023-11-15 NOTE — Progress Notes (Addendum)
PHARMACY - ANTICOAGULATION CONSULT NOTE  Pharmacy Consult for warfarin Indication: atrial fibrillation  Allergies  Allergen Reactions   Other Rash    Bandaids    Patient Measurements: Height: 5\' 7"  (170.2 cm) Weight: 80 kg (176 lb 5.9 oz) IBW/kg (Calculated) : 66.1  Vital Signs: Temp: 98.5 F (36.9 C) (01/24 0445) Temp Source: Oral (01/24 0445) BP: 136/46 (01/24 0445) Pulse Rate: 50 (01/24 0445)  Labs: Recent Labs    11/13/23 1050 11/13/23 1305 11/14/23 0523 11/15/23 0508  HGB 10.2*  --  9.2* 8.8*  HCT 30.9*  --  28.5* 27.5*  PLT 249  --  242 260  LABPROT 58.3*  --  62.0* 58.8*  INR 6.7*  --  7.2* 6.7*  CREATININE 2.52*  --  2.33* 2.46*  TROPONINIHS 80* 88*  --   --     Estimated Creatinine Clearance: 23.5 mL/min (A) (by C-G formula based on SCr of 2.46 mg/dL (H)).   Medical History: Past Medical History:  Diagnosis Date   (HFpEF) heart failure with preserved ejection fraction (HCC)    Arthritis    Atrial fibrillation (HCC)    Bradycardia    Carotid artery disease (HCC)    Hyperlipidemia    Hypertension    Pulmonary hypertension (HCC)    Type 2 diabetes mellitus (HCC)     Assessment: 83 year old male with known afib on chronic warfarin therapy. INR elevated at 7.2. Hemoglobin trending down 10.2>9.2, no bleeding issues noted. Will hold warfarin until INR trends down. Can consider boost supplements for subtle vitamin k replenishment. Will follow CBC closely.  1/24 update: INR remains elevated as well as LFTs, even subtle worsening. Will administer PO vitamin K today in attempt to treat coagulopathy.  New drug-drug interactions: Azithromycin - potential to enhance warfarin's effect  Goal of Therapy:  INR 2-3 Monitor platelets by anticoagulation protocol: Yes   Plan:  Continue holding warfarin Administer vitamin K 5 mg PO x 1 Daily INR  Will M. Dareen Piano, PharmD Clinical Pharmacist 11/15/2023 7:49 AM

## 2023-11-15 NOTE — Progress Notes (Signed)
PROGRESS NOTE    Robert Baldwin  WUJ:[id number] DOB: 23-Aug-1941 DOA: 11/13/2023 PCP: Kirstie Peri, MD   Brief Narrative:    ABRAHIM SARGENT is a 83 y.o. male with medical history significant for type 2 diabetes, hypertension, atrial fibrillation on Coumadin, dyslipidemia, HFpEF, pulmonary hypertension, and CKD stage IIIb-IV, who presented to the ED with worsening cough and shortness of breath along with generalized weakness.  Patient was admitted with acute hypoxemic respiratory failure secondary to multifocal community-acquired pneumonia and was started on empiric antibiotics.  Assessment & Plan:   Principal Problem:   Acute hypoxemic respiratory failure (HCC) Active Problems:   Mixed hyperlipidemia   Essential hypertension, benign   Atrial fibrillation with slow ventricular response (HCC)   Severe pulmonary hypertension (HCC)  Assessment and Plan:   Acute hypoxemic respiratory failure secondary to multifocal CAP-improving -Continue on azithromycin and Rocephin and monitor CBC -Monitor cultures -Wean oxygen as tolerated -Respiratory viral panel ordered and pending, flu and COVID and RSV negative -Blood cultures negative -Okay to transfer to telemetry -Schedule some breathing treatments -Mucinex added -Start IV Solu-Medrol twice daily 1/24 -Up in chair and incentive spirometry encouraged  Transaminitis with alkaline phosphatase elevation -Check acute hepatitis panel -Check ultrasound abdomen -Hold statin -Monitor a.m. labs   HFpEF with RV dysfunction -2D echocardiogram 04/2022 EF 70-75% -Continue tadalafil and Opsumit -Continue Demadex -Farxiga   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 07/2022 -Hold Lipitor given transaminitis   Type 2 diabetes with hyperglycemia -Continue SGLT2i -Continue carb modified diet -Add SSI   Permanent atrial fibrillation -Bradycardic and not on AV nodal blockade agents -Currently on Coumadin with supratherapeutic INR -Hold Coumadin  for now, appreciate pharmacy assistance -Plan to give vitamin K for supratherapeutic INR today   OSA on CPAP -Continue CPAP at night   CKD stage IIIb-4 -Baseline creatinine around 1.8   Dyslipidemia -Holding Lipitor   Hypertension -Continue hydralazine and Norvasc   DVT prophylaxis: Supratherapeutic INR Code Status: Full Family Communication: None at bedside Disposition Plan:  Status is: Inpatient Remains inpatient appropriate because: Need for IV antibiotics.   Consultants:  None  Procedures:  None  Antimicrobials:  Anti-infectives (From admission, onward)    Start     Dose/Rate Route Frequency Ordered Stop   11/14/23 1100  cefTRIAXone (ROCEPHIN) 1 g in sodium chloride 0.9 % 100 mL IVPB        1 g 200 mL/hr over 30 Minutes Intravenous Every 24 hours 11/13/23 1549     11/14/23 1000  azithromycin (ZITHROMAX) 500 mg in sodium chloride 0.9 % 250 mL IVPB        500 mg 250 mL/hr over 60 Minutes Intravenous Every 24 hours 11/13/23 1549     11/13/23 1115  cefTRIAXone (ROCEPHIN) 1 g in sodium chloride 0.9 % 100 mL IVPB        1 g 200 mL/hr over 30 Minutes Intravenous  Once 11/13/23 1111 11/13/23 1154   11/13/23 1115  azithromycin (ZITHROMAX) 500 mg in sodium chloride 0.9 % 250 mL IVPB        500 mg 250 mL/hr over 60 Minutes Intravenous  Once 11/13/23 1111 11/13/23 1231      Subjective: Patient seen and evaluated today with no new acute complaints or concerns. No acute concerns or events noted overnight.  He is starting to feel better, but continues to have elevated oxygen requirement.  Objective: Vitals:   11/15/23 0300 11/15/23 0400 11/15/23 0434 11/15/23 0445  BP: (!) 156/54 (!) 150/43  Marland Kitchen)  136/46  Pulse: (!) 55 61 (!) 53 (!) 50  Resp: 16 (!) 22 16 15   Temp:    98.5 F (36.9 C)  TempSrc:    Oral  SpO2: 95% (!) 86% 91% 94%  Weight:      Height:        Intake/Output Summary (Last 24 hours) at 11/15/2023 0913 Last data filed at 11/15/2023 0500 Gross per 24 hour   Intake 931.07 ml  Output 350 ml  Net 581.07 ml   Filed Weights   11/13/23 1020  Weight: 80 kg    Examination:  General exam: Appears calm and comfortable  Respiratory system: Clear to auscultation. Respiratory effort normal.  Currently on 8 L nasal cannula. Cardiovascular system: S1 & S2 heard, RRR.  Gastrointestinal system: Abdomen is soft Central nervous system: Alert and awake Extremities: No edema Skin: No significant lesions noted Psychiatry: Flat affect.    Data Reviewed: I have personally reviewed following labs and imaging studies  CBC: Recent Labs  Lab 11/13/23 1049 11/13/23 1050 11/14/23 0523 11/15/23 0508  WBC  --  38.3* 31.6* 25.4*  NEUTROABS  --  34.5*  --  22.4*  HGB 11.2* 10.2* 9.2* 8.8*  HCT 33.0* 30.9* 28.5* 27.5*  MCV  --  88.5 89.3 89.9  PLT  --  249 242 260   Basic Metabolic Panel: Recent Labs  Lab 11/13/23 1049 11/13/23 1050 11/14/23 0523 11/15/23 0508  NA 138 136 137 134*  K 4.0 4.2 3.8 3.9  CL 99 98 98 97*  CO2  --  26 27 24   GLUCOSE 293* 289* 265* 172*  BUN 69* 78* 81* 87*  CREATININE 2.70* 2.52* 2.33* 2.46*  CALCIUM  --  8.9 8.5* 8.3*  MG  --  2.7* 2.7* 2.5*   GFR: Estimated Creatinine Clearance: 23.5 mL/min (A) (by C-G formula based on SCr of 2.46 mg/dL (H)). Liver Function Tests: Recent Labs  Lab 11/13/23 1050 11/14/23 0523 11/15/23 0508  AST 67* 53* 74*  ALT 54* 47* 53*  ALKPHOS 175* 163* 200*  BILITOT 1.4* 0.9 0.9  PROT 7.5 6.5 6.6  ALBUMIN 2.9* 2.4* 2.4*   No results for input(s): "LIPASE", "AMYLASE" in the last 168 hours. No results for input(s): "AMMONIA" in the last 168 hours. Coagulation Profile: Recent Labs  Lab 11/13/23 1050 11/14/23 0523 11/15/23 0508  INR 6.7* 7.2* 6.7*   Cardiac Enzymes: No results for input(s): "CKTOTAL", "CKMB", "CKMBINDEX", "TROPONINI" in the last 168 hours. BNP (last 3 results) No results for input(s): "PROBNP" in the last 8760 hours. HbA1C: Recent Labs    11/14/23 0523   HGBA1C 7.4*   CBG: Recent Labs  Lab 11/14/23 1135 11/14/23 1615 11/14/23 2154 11/15/23 0738  GLUCAP 296* 179* 151* 163*   Lipid Profile: No results for input(s): "CHOL", "HDL", "LDLCALC", "TRIG", "CHOLHDL", "LDLDIRECT" in the last 72 hours. Thyroid Function Tests: No results for input(s): "TSH", "T4TOTAL", "FREET4", "T3FREE", "THYROIDAB" in the last 72 hours. Anemia Panel: Recent Labs    11/15/23 0508  VITAMINB12 1,876*  FOLATE 18.9  FERRITIN 217  TIBC 181*  IRON 19*  RETICCTPCT 2.5   Sepsis Labs: Recent Labs  Lab 11/13/23 1305 11/13/23 1449  LATICACIDVEN 1.2 1.2    Recent Results (from the past 240 hours)  Resp panel by RT-PCR (RSV, Flu A&B, Covid) Anterior Nasal Swab     Status: None   Collection Time: 11/13/23 10:25 AM   Specimen: Anterior Nasal Swab  Result Value Ref Range Status   SARS  Coronavirus 2 by RT PCR NEGATIVE NEGATIVE Final    Comment: (NOTE) SARS-CoV-2 target nucleic acids are NOT DETECTED.  The SARS-CoV-2 RNA is generally detectable in upper respiratory specimens during the acute phase of infection. The lowest concentration of SARS-CoV-2 viral copies this assay can detect is 138 copies/mL. A negative result does not preclude SARS-Cov-2 infection and should not be used as the sole basis for treatment or other patient management decisions. A negative result may occur with  improper specimen collection/handling, submission of specimen other than nasopharyngeal swab, presence of viral mutation(s) within the areas targeted by this assay, and inadequate number of viral copies(<138 copies/mL). A negative result must be combined with clinical observations, patient history, and epidemiological information. The expected result is Negative.  Fact Sheet for Patients:  BloggerCourse.com  Fact Sheet for Healthcare Providers:  SeriousBroker.it  This test is no t yet approved or cleared by the [place] FDA and  has been authorized for detection and/or diagnosis of SARS-CoV-2 by FDA under an Emergency Use Authorization (EUA). This EUA will remain  in effect (meaning this test can be used) for the duration of the COVID-19 declaration under Section 564(b)(1) of the Act, 21 U.S.C.section 360bbb-3(b)(1), unless the authorization is terminated  or revoked sooner.       Influenza A by PCR NEGATIVE NEGATIVE Final   Influenza B by PCR NEGATIVE NEGATIVE Final    Comment: (NOTE) The Xpert Xpress SARS-CoV-2/FLU/RSV plus assay is intended as an aid in the diagnosis of influenza from Nasopharyngeal swab specimens and should not be used as a sole basis for treatment. Nasal washings and aspirates are unacceptable for Xpert Xpress SARS-CoV-2/FLU/RSV testing.  Fact Sheet for Patients: BloggerCourse.com  Fact Sheet for Healthcare Providers: SeriousBroker.it  This test is not yet approved or cleared by the [place] FDA and has been authorized for detection and/or diagnosis of SARS-CoV-2 by FDA under an Emergency Use Authorization (EUA). This EUA will remain in effect (meaning this test can be used) for the duration of the COVID-19 declaration under Section 564(b)(1) of the Act, 21 U.S.C. section 360bbb-3(b)(1), unless the authorization is terminated or revoked.     Resp Syncytial Virus by PCR NEGATIVE NEGATIVE Final    Comment: (NOTE) Fact Sheet for Patients: BloggerCourse.com  Fact Sheet for Healthcare Providers: SeriousBroker.it  This test is not yet approved or cleared by the [place] FDA and has been authorized for detection and/or diagnosis of SARS-CoV-2 by FDA under an Emergency Use Authorization (EUA). This EUA will remain in effect (meaning this test can be used) for the duration of the COVID-19 declaration under Section 564(b)(1) of the Act, 21 U.S.C. section  360bbb-3(b)(1), unless the authorization is terminated or revoked.  Performed at [facility], [address], [place], [place] [address]   Blood culture (routine x 2)     Status: None (Preliminary result)   Collection Time: 11/13/23  1:05 PM   Specimen: BLOOD  Result Value Ref Range Status   Specimen Description BLOOD BLOOD LEFT HAND  Final   Special Requests   Final    Blood Culture results may not be optimal due to an inadequate volume of blood received in culture bottles BOTTLES DRAWN AEROBIC AND ANAEROBIC   Culture   Final    NO GROWTH 2 DAYS Performed at [facility], [address] [place], [place] [address]    Report Status PENDING  Incomplete  Blood culture (routine x 2)     Status: None (Preliminary result)  Collection Time: 11/13/23  1:05 PM   Specimen: BLOOD  Result Value Ref Range Status   Specimen Description BLOOD BLOOD RIGHT HAND  Final   Special Requests   Final    Blood Culture results may not be optimal due to an inadequate volume of blood received in culture bottles BOTTLES DRAWN AEROBIC ONLY   Culture   Final    NO GROWTH 2 DAYS Performed at [facility], [address] [place], [place] [address]    Report Status PENDING  Incomplete         Radiology Studies: CT Chest Wo Contrast Result Date: 11/13/2023 CLINICAL DATA:  Respiratory illness.  Hypoxia. EXAM: CT CHEST WITHOUT CONTRAST TECHNIQUE: Multidetector CT imaging of the chest was performed following the standard protocol without IV contrast. RADIATION DOSE REDUCTION: This exam was performed according to the departmental dose-optimization program which includes automated exposure control, adjustment of the mA and/or kV according to patient size and/or use of iterative reconstruction technique. COMPARISON:  Chest x-ray from same day. CT chest dated May 03, 2022. FINDINGS: Cardiovascular: Unchanged cardiomegaly and small pericardial effusion. No thoracic aortic aneurysm. Coronary, aortic arch, and branch  vessel atherosclerotic vascular disease. Mediastinum/Nodes: Prominent mediastinal lymph nodes measuring up to 1.4 cm in short axis, increased in size since the prior study. No enlarged axillary lymph nodes. Thyroid gland, trachea, and esophagus demonstrate no significant findings. Lungs/Pleura: Trace bilateral pleural effusions, slightly larger on the left. Extensive and patchy consolidative opacities throughout both lungs. No pneumothorax. Upper Abdomen: No acute abnormality. Unchanged nodular liver contour. Musculoskeletal: No acute or significant osseous findings. IMPRESSION: 1. Extensive and patchy consolidative opacities throughout both lungs, consistent with multifocal pneumonia. 2. Trace bilateral pleural effusions. 3. Unchanged cardiomegaly and small pericardial effusion. 4.  Aortic Atherosclerosis (ICD10-I70.0). Electronically Signed   By: Obie Dredge M.D.   On: 11/13/2023 13:07   DG Chest Port 1 View Result Date: 11/13/2023 CLINICAL DATA:  Shortness of breath with hypoxia and low blood pressure. EXAM: PORTABLE CHEST 1 VIEW COMPARISON:  05/01/2022 FINDINGS: Low volume film. The cardio pericardial silhouette is enlarged. Diffuse interstitial opacity is similar to mildly progressive in the interval and a component of interstitial edema is not excluded. Probable tiny bilateral pleural effusions. No acute bony abnormality. Telemetry leads overlie the chest. IMPRESSION: Low volume film with diffuse interstitial opacity, similar to mildly progressive in the interval. A component of interstitial edema is not excluded. Electronically Signed   By: Kennith Center M.D.   On: 11/13/2023 11:15        Scheduled Meds:  amLODipine  10 mg Oral Daily   Chlorhexidine Gluconate Cloth  6 each Topical Q0600   dapagliflozin propanediol  10 mg Oral Daily   dextromethorphan-guaiFENesin  1 tablet Oral BID   hydrALAZINE  25 mg Oral BID   insulin aspart  0-5 Units Subcutaneous QHS   insulin aspart  0-9 Units  Subcutaneous TID WC   ipratropium-albuterol  3 mL Nebulization Q6H   macitentan  10 mg Oral Daily   methylPREDNISolone (SOLU-MEDROL) injection  40 mg Intravenous Q12H   phytonadione  5 mg Oral Once   tadalafil  20 mg Oral BID   torsemide  80 mg Oral q morning   And   torsemide  40 mg Oral QPM   Warfarin - Pharmacist Dosing Inpatient   Does not apply q1600   Continuous Infusions:  azithromycin 500 mg (11/15/23 0903)   cefTRIAXone (ROCEPHIN)  IV Stopped (11/14/23 1121)     LOS:  2 days    Time spent: 55 minutes    Deloria Brassfield Hoover Brunette, DO [facility]  If 7PM-7AM, please contact night-coverage [email or web address] 11/15/2023, 9:13 AM

## 2023-11-15 NOTE — Progress Notes (Signed)
Patient states that he does not use a CPAP/BIPAP at home as he has never been able to tolerate one.  Patient stable on 8L HFNC at this time.  O2 sat 97%.  He does have some shortness of breath and desaturation with exertion but not at rest.

## 2023-11-16 DIAGNOSIS — J9601 Acute respiratory failure with hypoxia: Secondary | ICD-10-CM | POA: Diagnosis not present

## 2023-11-16 LAB — COMPREHENSIVE METABOLIC PANEL
ALT: 47 U/L — ABNORMAL HIGH (ref 0–44)
AST: 51 U/L — ABNORMAL HIGH (ref 15–41)
Albumin: 2.3 g/dL — ABNORMAL LOW (ref 3.5–5.0)
Alkaline Phosphatase: 185 U/L — ABNORMAL HIGH (ref 38–126)
Anion gap: 13 (ref 5–15)
BUN: 101 mg/dL — ABNORMAL HIGH (ref 8–23)
CO2: 23 mmol/L (ref 22–32)
Calcium: 8.1 mg/dL — ABNORMAL LOW (ref 8.9–10.3)
Chloride: 100 mmol/L (ref 98–111)
Creatinine, Ser: 2.61 mg/dL — ABNORMAL HIGH (ref 0.61–1.24)
GFR, Estimated: 24 mL/min — ABNORMAL LOW (ref >60)
Glucose, Bld: 273 mg/dL — ABNORMAL HIGH (ref 70–99)
Potassium: 4.6 mmol/L (ref 3.5–5.1)
Sodium: 136 mmol/L (ref 135–145)
Total Bilirubin: 0.9 mg/dL (ref 0.0–1.2)
Total Protein: 6.6 g/dL (ref 6.5–8.1)

## 2023-11-16 LAB — GLUCOSE, CAPILLARY
Glucose-Capillary: 265 mg/dL — ABNORMAL HIGH (ref 70–99)
Glucose-Capillary: 315 mg/dL — ABNORMAL HIGH (ref 70–99)
Glucose-Capillary: 319 mg/dL — ABNORMAL HIGH (ref 70–99)
Glucose-Capillary: 298 mg/dL — ABNORMAL HIGH (ref 70–99)

## 2023-11-16 LAB — CBC
HCT: 26.2 % — ABNORMAL LOW (ref 39.0–52.0)
Hemoglobin: 8.2 g/dL — ABNORMAL LOW (ref 13.0–17.0)
MCH: 28 pg (ref 26.0–34.0)
MCHC: 31.3 g/dL (ref 30.0–36.0)
MCV: 89.4 fL (ref 80.0–100.0)
Platelets: 269 10*3/uL (ref 150–400)
RBC: 2.93 MIL/uL — ABNORMAL LOW (ref 4.22–5.81)
RDW: 15.9 % — ABNORMAL HIGH (ref 11.5–15.5)
WBC: 20.6 10*3/uL — ABNORMAL HIGH (ref 4.0–10.5)
nRBC: 0.1 % (ref 0.0–0.2)

## 2023-11-16 LAB — PROTIME-INR
INR: 3.5 — ABNORMAL HIGH (ref 0.8–1.2)
Prothrombin Time: 35.2 s — ABNORMAL HIGH (ref 11.4–15.2)

## 2023-11-16 LAB — MAGNESIUM: Magnesium: 2.7 mg/dL — ABNORMAL HIGH (ref 1.7–2.4)

## 2023-11-16 MED ORDER — IPRATROPIUM-ALBUTEROL 0.5-2.5 (3) MG/3ML IN SOLN
3.0000 mL | Freq: Three times a day (TID) | RESPIRATORY_TRACT | Status: DC
Start: 2023-11-17 — End: 2023-11-18
  Administered 2023-11-17 – 2023-11-18 (×4): 3 mL via RESPIRATORY_TRACT
  Filled 2023-11-16 (×4): qty 3

## 2023-11-16 MED ORDER — METOLAZONE 5 MG PO TABS
2.5000 mg | ORAL_TABLET | Freq: Once | ORAL | Status: AC
  Administered 2023-11-16: 2.5 mg via ORAL
  Filled 2023-11-16: qty 1

## 2023-11-16 MED ORDER — ALBUTEROL SULFATE (2.5 MG/3ML) 0.083% IN NEBU: 2.5000 mg | INHALATION_SOLUTION | RESPIRATORY_TRACT | Status: DC | PRN

## 2023-11-16 NOTE — Progress Notes (Signed)
Pt called to get up because he needed to use urinal. RN to bedside to assist pt. Pt desatted to low to mids 80s while standing. Pt is unsteady. RN tried to help pt use urinal but pt insisted he could do it. Pt did not urinate in urinal he missed and urine pooled on floor. Pt's brief were placed in belongs bag. Pt assisted back in bed and pt was very SOB. Cont to sat mid 80s. Coached pt on breathing in through nose and out through mouth. After about 5 mins patient satting lows 90s. After 10 mins back to mid 90s.

## 2023-11-16 NOTE — Progress Notes (Signed)
PROGRESS NOTE    Robert Baldwin  ZOX:[id number] DOB: 30-Apr-1941 DOA: 11/13/2023 PCP: Kirstie Peri, MD   Brief Narrative:    Robert Baldwin is a 83 y.o. male with medical history significant for type 2 diabetes, hypertension, atrial fibrillation on Coumadin, dyslipidemia, HFpEF, pulmonary hypertension, and CKD stage IIIb-IV, who presented to the ED with worsening cough and shortness of breath along with generalized weakness.  Patient was admitted with acute hypoxemic respiratory failure secondary to multifocal community-acquired pneumonia and was started on empiric antibiotics.  Assessment & Plan:   Principal Problem:   Acute hypoxemic respiratory failure (HCC) Active Problems:   Mixed hyperlipidemia   Essential hypertension, benign   Atrial fibrillation with slow ventricular response (HCC)   Severe pulmonary hypertension (HCC)  Assessment and Plan:   Acute hypoxemic respiratory failure secondary to multifocal CAP and pulmonary edema -Continue on azithromycin and Rocephin and monitor CBC -Monitor cultures -Wean oxygen as tolerated -Respiratory viral panel ordered and pending, flu and COVID and RSV negative -Blood cultures negative -Continue scheduled breathing treatments and Mucinex -Start IV Solu-Medrol twice daily 1/24 -Up in chair and incentive spirometry encouraged -Chest x-ray 1/24 with cardiomegaly and pulmonary edema, add metolazone 1/25 with ongoing torsemide use  Transaminitis with alkaline phosphatase elevation -Acute hepatitis panel negative -Ultrasound abdomen without any significant findings -Hold statin -Monitor a.m. labs  Worsening anemia -No overt bleeding noted, continue to follow trend -INR improving to 3.5 today, continue to monitor -Noted to have iron deficiency -Plan to check stool occult   HFpEF with RV dysfunction -2D echocardiogram 04/2022 EF 70-75% -Continue tadalafil and Opsumit -Continue Demadex -Farxiga   Severe bilateral ICA stenosis status  post TCAR 07/2022 -Hold Lipitor given transaminitis   Type 2 diabetes with hyperglycemia -Continue SGLT2i -Continue carb modified diet -Add SSI   Permanent atrial fibrillation -Bradycardic and not on AV nodal blockade agents -Currently on Coumadin with supratherapeutic INR -Hold Coumadin for now, appreciate pharmacy assistance -Vitamin K given 1/24   OSA on CPAP -Continue CPAP at night   CKD stage IIIb-4 -Baseline creatinine around 1.8   Dyslipidemia -Holding Lipitor   Hypertension -Continue hydralazine and Norvasc   DVT prophylaxis: Supratherapeutic INR Code Status: Full Family Communication: None at bedside Disposition Plan:  Status is: Inpatient Remains inpatient appropriate because: Need for IV antibiotics.   Consultants:  None  Procedures:  None  Antimicrobials:  Anti-infectives (From admission, onward)    Start     Dose/Rate Route Frequency Ordered Stop   11/14/23 1100  cefTRIAXone (ROCEPHIN) 1 g in sodium chloride 0.9 % 100 mL IVPB        1 g 200 mL/hr over 30 Minutes Intravenous Every 24 hours 11/13/23 1549     11/14/23 1000  azithromycin (ZITHROMAX) 500 mg in sodium chloride 0.9 % 250 mL IVPB        500 mg 250 mL/hr over 60 Minutes Intravenous Every 24 hours 11/13/23 1549     11/13/23 1115  cefTRIAXone (ROCEPHIN) 1 g in sodium chloride 0.9 % 100 mL IVPB        1 g 200 mL/hr over 30 Minutes Intravenous  Once 11/13/23 1111 11/13/23 1154   11/13/23 1115  azithromycin (ZITHROMAX) 500 mg in sodium chloride 0.9 % 250 mL IVPB        500 mg 250 mL/hr over 60 Minutes Intravenous  Once 11/13/23 1111 11/13/23 1231      Subjective: Patient seen and evaluated today with no new acute complaints or concerns. No acute  concerns or events noted overnight.  He is starting to feel better, but continues to have elevated oxygen requirement.  Objective: Vitals:   11/16/23 0224 11/16/23 0600 11/16/23 0635 11/16/23 0810  BP:  (!) 136/45  (!) 137/41  Pulse:  (!) 48   (!) 56  Resp:      Temp:   (!) 97.3 F (36.3 C) 97.7 F (36.5 C)  TempSrc:    Oral  SpO2: 95% 92%  (!) 87%  Weight:      Height:        Intake/Output Summary (Last 24 hours) at 11/16/2023 1008 Last data filed at 11/16/2023 0900 Gross per 24 hour  Intake 720 ml  Output 475 ml  Net 245 ml   Filed Weights   11/13/23 1020  Weight: 80 kg    Examination:  General exam: Appears calm and comfortable  Respiratory system: Bilateral mild crackles. Respiratory effort normal.  Currently on 6 L nasal cannula. Cardiovascular system: S1 & S2 heard, RRR.  Gastrointestinal system: Abdomen is soft Central nervous system: Alert and awake Extremities: No edema Skin: No significant lesions noted Psychiatry: Flat affect.    Data Reviewed: I have personally reviewed following labs and imaging studies  CBC: Recent Labs  Lab 11/13/23 1050 11/14/23 0523 11/15/23 0508 11/15/23 1810 11/16/23 0506  WBC 38.3* 31.6* 25.4*  --  20.6*  NEUTROABS 34.5*  --  22.4*  --   --   HGB 10.2* 9.2* 8.8* 8.6* 8.2*  HCT 30.9* 28.5* 27.5* 26.7* 26.2*  MCV 88.5 89.3 89.9  --  89.4  PLT 249 242 260  --  269   Basic Metabolic Panel: Recent Labs  Lab 11/13/23 1049 11/13/23 1050 11/14/23 0523 11/15/23 0508 11/16/23 0506  NA 138 136 137 134* 136  K 4.0 4.2 3.8 3.9 4.6  CL 99 98 98 97* 100  CO2  --  26 27 24 23   GLUCOSE 293* 289* 265* 172* 273*  BUN 69* 78* 81* 87* 101*  CREATININE 2.70* 2.52* 2.33* 2.46* 2.61*  CALCIUM  --  8.9 8.5* 8.3* 8.1*  MG  --  2.7* 2.7* 2.5* 2.7*   GFR: Estimated Creatinine Clearance: 22.1 mL/min (A) (by C-G formula based on SCr of 2.61 mg/dL (H)). Liver Function Tests: Recent Labs  Lab 11/13/23 1050 11/14/23 0523 11/15/23 0508 11/16/23 0506  AST 67* 53* 74* 51*  ALT 54* 47* 53* 47*  ALKPHOS 175* 163* 200* 185*  BILITOT 1.4* 0.9 0.9 0.9  PROT 7.5 6.5 6.6 6.6  ALBUMIN 2.9* 2.4* 2.4* 2.3*   No results for input(s): "LIPASE", "AMYLASE" in the last 168 hours. No  results for input(s): "AMMONIA" in the last 168 hours. Coagulation Profile: Recent Labs  Lab 11/13/23 1050 11/14/23 0523 11/15/23 0508 11/16/23 0506  INR 6.7* 7.2* 6.7* 3.5*   Cardiac Enzymes: No results for input(s): "CKTOTAL", "CKMB", "CKMBINDEX", "TROPONINI" in the last 168 hours. BNP (last 3 results) No results for input(s): "PROBNP" in the last 8760 hours. HbA1C: Recent Labs    11/14/23 0523  HGBA1C 7.4*   CBG: Recent Labs  Lab 11/15/23 0738 11/15/23 1117 11/15/23 1634 11/15/23 2125 11/16/23 0732  GLUCAP 163* 199* 347* 241* 265*   Lipid Profile: No results for input(s): "CHOL", "HDL", "LDLCALC", "TRIG", "CHOLHDL", "LDLDIRECT" in the last 72 hours. Thyroid Function Tests: No results for input(s): "TSH", "T4TOTAL", "FREET4", "T3FREE", "THYROIDAB" in the last 72 hours. Anemia Panel: Recent Labs    11/15/23 0508  VITAMINB12 1,876*  FOLATE 18.9  FERRITIN  217  TIBC 181*  IRON 19*  RETICCTPCT 2.5   Sepsis Labs: Recent Labs  Lab 11/13/23 1305 11/13/23 1449  LATICACIDVEN 1.2 1.2    Recent Results (from the past 240 hours)  Resp panel by RT-PCR (RSV, Flu A&B, Covid) Anterior Nasal Swab     Status: None   Collection Time: 11/13/23 10:25 AM   Specimen: Anterior Nasal Swab  Result Value Ref Range Status   SARS Coronavirus 2 by RT PCR NEGATIVE NEGATIVE Final    Comment: (NOTE) SARS-CoV-2 target nucleic acids are NOT DETECTED.  The SARS-CoV-2 RNA is generally detectable in upper respiratory specimens during the acute phase of infection. The lowest concentration of SARS-CoV-2 viral copies this assay can detect is 138 copies/mL. A negative result does not preclude SARS-Cov-2 infection and should not be used as the sole basis for treatment or other patient management decisions. A negative result may occur with  improper specimen collection/handling, submission of specimen other than nasopharyngeal swab, presence of viral mutation(s) within the areas targeted  by this assay, and inadequate number of viral copies(<138 copies/mL). A negative result must be combined with clinical observations, patient history, and epidemiological information. The expected result is Negative.  Fact Sheet for Patients:  BloggerCourse.com  Fact Sheet for Healthcare Providers:  SeriousBroker.it  This test is no t yet approved or cleared by the [place] FDA and  has been authorized for detection and/or diagnosis of SARS-CoV-2 by FDA under an Emergency Use Authorization (EUA). This EUA will remain  in effect (meaning this test can be used) for the duration of the COVID-19 declaration under Section 564(b)(1) of the Act, 21 U.S.C.section 360bbb-3(b)(1), unless the authorization is terminated  or revoked sooner.       Influenza A by PCR NEGATIVE NEGATIVE Final   Influenza B by PCR NEGATIVE NEGATIVE Final    Comment: (NOTE) The Xpert Xpress SARS-CoV-2/FLU/RSV plus assay is intended as an aid in the diagnosis of influenza from Nasopharyngeal swab specimens and should not be used as a sole basis for treatment. Nasal washings and aspirates are unacceptable for Xpert Xpress SARS-CoV-2/FLU/RSV testing.  Fact Sheet for Patients: BloggerCourse.com  Fact Sheet for Healthcare Providers: SeriousBroker.it  This test is not yet approved or cleared by the [place] FDA and has been authorized for detection and/or diagnosis of SARS-CoV-2 by FDA under an Emergency Use Authorization (EUA). This EUA will remain in effect (meaning this test can be used) for the duration of the COVID-19 declaration under Section 564(b)(1) of the Act, 21 U.S.C. section 360bbb-3(b)(1), unless the authorization is terminated or revoked.     Resp Syncytial Virus by PCR NEGATIVE NEGATIVE Final    Comment: (NOTE) Fact Sheet for Patients: BloggerCourse.com  Fact  Sheet for Healthcare Providers: SeriousBroker.it  This test is not yet approved or cleared by the [place] FDA and has been authorized for detection and/or diagnosis of SARS-CoV-2 by FDA under an Emergency Use Authorization (EUA). This EUA will remain in effect (meaning this test can be used) for the duration of the COVID-19 declaration under Section 564(b)(1) of the Act, 21 U.S.C. section 360bbb-3(b)(1), unless the authorization is terminated or revoked.  Performed at [facility], [address], [place], [place] [address]   Blood culture (routine x 2)     Status: None (Preliminary result)   Collection Time: 11/13/23  1:05 PM   Specimen: BLOOD  Result Value Ref Range Status   Specimen Description BLOOD BLOOD LEFT HAND  Final   Special Requests  Final    Blood Culture results may not be optimal due to an inadequate volume of blood received in culture bottles BOTTLES DRAWN AEROBIC AND ANAEROBIC   Culture   Final    NO GROWTH 3 DAYS Performed at [facility], [address] [place], [place] [address]    Report Status PENDING  Incomplete  Blood culture (routine x 2)     Status: None (Preliminary result)   Collection Time: 11/13/23  1:05 PM   Specimen: BLOOD  Result Value Ref Range Status   Specimen Description BLOOD BLOOD RIGHT HAND  Final   Special Requests   Final    Blood Culture results may not be optimal due to an inadequate volume of blood received in culture bottles BOTTLES DRAWN AEROBIC ONLY   Culture   Final    NO GROWTH 3 DAYS Performed at [facility], [address], [place], [place] [address]    Report Status PENDING  Incomplete         Radiology Studies: DG CHEST PORT 1 VIEW Result Date: 11/15/2023 CLINICAL DATA:  Dyspnea EXAM: PORTABLE CHEST 1 VIEW COMPARISON:  11/13/2023 FINDINGS: Cardiomegaly with vascular congestion and small right effusion. Multifocal airspace opacities suggestive of pneumonia. No pneumothorax IMPRESSION:  Cardiomegaly with vascular congestion and small right effusion. Multifocal airspace opacities suggestive of pneumonia. Electronically Signed   By: Jasmine Pang M.D.   On: 11/15/2023 19:55   US Abdomen Limited RUQ (LIVER/GB) Result Date: 11/15/2023 CLINICAL DATA:  Transaminitis EXAM: ULTRASOUND ABDOMEN LIMITED RIGHT UPPER QUADRANT COMPARISON:  None Available. FINDINGS: Gallbladder: Gallbladder is underdistended. Shadowing stones. No adjacent fluid. Common bile duct: Diameter: 3 mm Liver: Slightly heterogeneous echogenic liver. Portal vein is patent on color Doppler imaging with normal direction of blood flow towards the liver. Other: None. IMPRESSION: Gallbladder is underdistended. There is some shadowing stones. No reported Murphy sign. No ductal dilatation. Slightly heterogeneous and nodular liver. Electronically Signed   By: Karen Kays M.D.   On: 11/15/2023 10:39        Scheduled Meds:  amLODipine  10 mg Oral Daily   dapagliflozin propanediol  10 mg Oral Daily   dextromethorphan-guaiFENesin  1 tablet Oral BID   hydrALAZINE  25 mg Oral BID   insulin aspart  0-5 Units Subcutaneous QHS   insulin aspart  0-9 Units Subcutaneous TID WC   ipratropium-albuterol  3 mL Nebulization Q6H   macitentan  10 mg Oral Daily   methylPREDNISolone (SOLU-MEDROL) injection  40 mg Intravenous Q12H   metolazone  2.5 mg Oral Once   tadalafil  20 mg Oral BID   torsemide  80 mg Oral q morning   And   torsemide  40 mg Oral QPM   Warfarin - Pharmacist Dosing Inpatient   Does not apply q1600   Continuous Infusions:  azithromycin 500 mg (11/16/23 0904)   cefTRIAXone (ROCEPHIN)  IV Stopped (11/15/23 1106)     LOS: 3 days    Time spent: 55 minutes    Sequoia Mincey D [name], DO [facility]  If 7PM-7AM, please contact night-coverage [email or web address] 11/16/2023, 10:08 AM

## 2023-11-16 NOTE — Progress Notes (Signed)
PHARMACY - ANTICOAGULATION CONSULT NOTE  Pharmacy Consult for warfarin Indication: atrial fibrillation  Allergies  Allergen Reactions   Other Rash    Bandaids    Patient Measurements: Height: 5\' 7"  (170.2 cm) Weight: 80 kg (176 lb 5.9 oz) IBW/kg (Calculated) : 66.1  Vital Signs: Temp: 97 F (36.1 C) (01/25 1300) Temp Source: Oral (01/25 1300) BP: 127/46 (01/25 1300) Pulse Rate: 56 (01/25 0810)  Labs: Recent Labs    11/14/23 0523 11/15/23 0508 11/15/23 1810 11/16/23 0506  HGB 9.2* 8.8* 8.6* 8.2*  HCT 28.5* 27.5* 26.7* 26.2*  PLT 242 260  --  269  LABPROT 62.0* 58.8*  --  35.2*  INR 7.2* 6.7*  --  3.5*  CREATININE 2.33* 2.46*  --  2.61*    Estimated Creatinine Clearance: 22.1 mL/min (A) (by C-G formula based on SCr of 2.61 mg/dL (H)).   Medical History: Past Medical History:  Diagnosis Date   (HFpEF) heart failure with preserved ejection fraction (HCC)    Arthritis    Atrial fibrillation (HCC)    Bradycardia    Carotid artery disease (HCC)    Hyperlipidemia    Hypertension    Pulmonary hypertension (HCC)    Type 2 diabetes mellitus (HCC)     Assessment: 83 year old male with known afib on chronic warfarin therapy. INR elevated at 7.2. Hemoglobin trending down 10.2>9.2, no bleeding issues noted. Will hold warfarin until INR trends down. Can consider boost supplements for subtle vitamin k replenishment. Will follow CBC closely.  1/24 update: INR remains elevated as well as LFTs, even subtle worsening. Will administer PO vitamin K today in attempt to treat coagulopathy.  1/25 update: INR finally trending down s/p vit K yesterday  New drug-drug interactions: Azithromycin - potential to enhance warfarin's effect  Goal of Therapy:  INR 2-3 Monitor platelets by anticoagulation protocol: Yes   Plan:  Continue holding warfarin Daily INR  Will M. Dareen Piano, PharmD Clinical Pharmacist 11/16/2023 1:37 PM

## 2023-11-16 NOTE — Plan of Care (Signed)
  Problem: Clinical Measurements: Goal: Will remain free from infection Outcome: Progressing   Problem: Activity: Goal: Risk for activity intolerance will decrease Outcome: Progressing   Problem: Clinical Measurements: Goal: Respiratory complications will improve Outcome: Progressing

## 2023-11-17 DIAGNOSIS — J9601 Acute respiratory failure with hypoxia: Secondary | ICD-10-CM | POA: Diagnosis not present

## 2023-11-17 LAB — CBC
HCT: 25.7 % — ABNORMAL LOW (ref 39.0–52.0)
Hemoglobin: 8.1 g/dL — ABNORMAL LOW (ref 13.0–17.0)
MCH: 28.2 pg (ref 26.0–34.0)
MCHC: 31.5 g/dL (ref 30.0–36.0)
MCV: 89.5 fL (ref 80.0–100.0)
Platelets: 290 10*3/uL (ref 150–400)
RBC: 2.87 MIL/uL — ABNORMAL LOW (ref 4.22–5.81)
RDW: 15.9 % — ABNORMAL HIGH (ref 11.5–15.5)
WBC: 23.8 10*3/uL — ABNORMAL HIGH (ref 4.0–10.5)
nRBC: 0.1 % (ref 0.0–0.2)

## 2023-11-17 LAB — COMPREHENSIVE METABOLIC PANEL
ALT: 41 U/L (ref 0–44)
AST: 37 U/L (ref 15–41)
Albumin: 2.3 g/dL — ABNORMAL LOW (ref 3.5–5.0)
Alkaline Phosphatase: 158 U/L — ABNORMAL HIGH (ref 38–126)
Anion gap: 13 (ref 5–15)
BUN: 117 mg/dL — ABNORMAL HIGH (ref 8–23)
CO2: 24 mmol/L (ref 22–32)
Calcium: 7.9 mg/dL — ABNORMAL LOW (ref 8.9–10.3)
Chloride: 97 mmol/L — ABNORMAL LOW (ref 98–111)
Creatinine, Ser: 2.78 mg/dL — ABNORMAL HIGH (ref 0.61–1.24)
GFR, Estimated: 22 mL/min — ABNORMAL LOW (ref >60)
Glucose, Bld: 273 mg/dL — ABNORMAL HIGH (ref 70–99)
Potassium: 4.5 mmol/L (ref 3.5–5.1)
Sodium: 134 mmol/L — ABNORMAL LOW (ref 135–145)
Total Bilirubin: 0.8 mg/dL (ref 0.0–1.2)
Total Protein: 6.3 g/dL — ABNORMAL LOW (ref 6.5–8.1)

## 2023-11-17 LAB — MAGNESIUM: Magnesium: 2.8 mg/dL — ABNORMAL HIGH (ref 1.7–2.4)

## 2023-11-17 LAB — GLUCOSE, CAPILLARY
Glucose-Capillary: 249 mg/dL — ABNORMAL HIGH (ref 70–99)
Glucose-Capillary: 276 mg/dL — ABNORMAL HIGH (ref 70–99)
Glucose-Capillary: 344 mg/dL — ABNORMAL HIGH (ref 70–99)
Glucose-Capillary: 366 mg/dL — ABNORMAL HIGH (ref 70–99)
Glucose-Capillary: 282 mg/dL — ABNORMAL HIGH (ref 70–99)

## 2023-11-17 LAB — PROTIME-INR
INR: 3 — ABNORMAL HIGH (ref 0.8–1.2)
Prothrombin Time: 31.2 s — ABNORMAL HIGH (ref 11.4–15.2)

## 2023-11-17 MED ORDER — METOLAZONE 5 MG PO TABS: 5.0000 mg | ORAL_TABLET | Freq: Once | ORAL | Status: DC

## 2023-11-17 MED ORDER — WARFARIN SODIUM 5 MG PO TABS
5.0000 mg | ORAL_TABLET | Freq: Once | ORAL | Status: AC
  Administered 2023-11-17: 5 mg via ORAL
  Filled 2023-11-17: qty 1

## 2023-11-17 MED ORDER — POLYETHYLENE GLYCOL 3350 17 G PO PACK
17.0000 g | PACK | Freq: Every day | ORAL | Status: DC
  Administered 2023-11-17 – 2023-11-29 (×7): 17 g via ORAL
  Filled 2023-11-17 (×12): qty 1

## 2023-11-17 MED ORDER — TORSEMIDE 20 MG PO TABS
80.0000 mg | ORAL_TABLET | Freq: Every morning | ORAL | Status: DC
  Administered 2023-11-18: 80 mg via ORAL
  Filled 2023-11-17: qty 4

## 2023-11-17 MED ORDER — TORSEMIDE 20 MG PO TABS
80.0000 mg | ORAL_TABLET | Freq: Every evening | ORAL | Status: DC
  Administered 2023-11-17: 80 mg via ORAL
  Filled 2023-11-17: qty 4

## 2023-11-17 NOTE — Progress Notes (Addendum)
PHARMACY - ANTICOAGULATION CONSULT NOTE  Pharmacy Consult for warfarin Indication: atrial fibrillation  Allergies  Allergen Reactions   Other Rash    Bandaids    Patient Measurements: Height: 5\' 7"  (170.2 cm) Weight: 80 kg (176 lb 5.9 oz) IBW/kg (Calculated) : 66.1  Vital Signs: Temp: 97.5 F (36.4 C) (01/26 0420) Temp Source: Oral (01/26 0420) BP: 129/41 (01/26 0420)  Labs: Recent Labs    11/15/23 0508 11/15/23 1810 11/16/23 0506 11/17/23 0416  HGB 8.8* 8.6* 8.2* 8.1*  HCT 27.5* 26.7* 26.2* 25.7*  PLT 260  --  269 290  LABPROT 58.8*  --  35.2* 31.2*  INR 6.7*  --  3.5* 3.0*  CREATININE 2.46*  --  2.61* 2.78*    Estimated Creatinine Clearance: 20.8 mL/min (A) (by C-G formula based on SCr of 2.78 mg/dL (H)).   Medical History: Past Medical History:  Diagnosis Date   (HFpEF) heart failure with preserved ejection fraction (HCC)    Arthritis    Atrial fibrillation (HCC)    Bradycardia    Carotid artery disease (HCC)    Hyperlipidemia    Hypertension    Pulmonary hypertension (HCC)    Type 2 diabetes mellitus (HCC)     Assessment: 83 year old male with known afib on chronic warfarin therapy. INR elevated at 7.2. Hemoglobin trending down 10.2>9.2, no bleeding issues noted. Will hold warfarin until INR trends down. Can consider boost supplements for subtle vitamin k replenishment. Will follow CBC closely.  1/24 update: INR remains elevated as well as LFTs, even subtle worsening. Will administer PO vitamin K today in attempt to treat coagulopathy.  1/25: INR trending down s/p vit K yesterday  1/26: INR continues to trend down, now at upper end of goal. Patient's PO intake is advancing (75% of meals yesterday, up from 50%) and LFTs back WNL.  New drug-drug interactions: Azithromycin - potential to enhance warfarin's effect  Goal of Therapy:  INR 2-3 Monitor platelets by anticoagulation protocol: Yes   Plan:  Administer warfarin 5 mg PO x 1 today Daily  INR  Will M. Dareen Piano, PharmD Clinical Pharmacist 11/17/2023 9:29 AM

## 2023-11-17 NOTE — Plan of Care (Signed)
?  Problem: Clinical Measurements: ?Goal: Ability to maintain clinical measurements within normal limits will improve ?Outcome: Progressing ?  ?Problem: Clinical Measurements: ?Goal: Will remain free from infection ?Outcome: Progressing ?  ?Problem: Clinical Measurements: ?Goal: Respiratory complications will improve ?Outcome: Progressing ?  ?

## 2023-11-17 NOTE — Plan of Care (Signed)

## 2023-11-17 NOTE — Progress Notes (Signed)
PROGRESS NOTE    Robert Baldwin  RUE:[id number] DOB: 1941/01/14 DOA: 11/13/2023 PCP: Kirstie Peri, MD   Brief Narrative:    Robert Baldwin is a 83 y.o. male with medical history significant for type 2 diabetes, hypertension, atrial fibrillation on Coumadin, dyslipidemia, HFpEF, pulmonary hypertension, and CKD stage IIIb-IV, who presented to the ED with worsening cough and shortness of breath along with generalized weakness.  Patient was admitted with acute hypoxemic respiratory failure secondary to multifocal community-acquired pneumonia and was started on empiric antibiotics.  Assessment & Plan:   Principal Problem:   Acute hypoxemic respiratory failure (HCC) Active Problems:   Mixed hyperlipidemia   Essential hypertension, benign   Atrial fibrillation with slow ventricular response (HCC)   Severe pulmonary hypertension (HCC)  Assessment and Plan:   Acute hypoxemic respiratory failure secondary to multifocal CAP and pulmonary edema -Continue on azithromycin and Rocephin and monitor CBC -Monitor cultures -Wean oxygen as tolerated -Respiratory viral panel ordered and pending, flu and COVID and RSV negative -Blood cultures negative -Continue scheduled breathing treatments and Mucinex -Start IV Solu-Medrol twice daily 1/24 -Up in chair and incentive spirometry encouraged -Chest x-ray 1/24 with cardiomegaly and pulmonary edema ongoing torsemide use increased to 80 mg twice daily  Transaminitis with alkaline phosphatase elevation-improved -Acute hepatitis panel negative -Ultrasound abdomen without any significant findings -Hold statin -Monitor a.m. labs  Worsening anemia -No overt bleeding noted, continue to follow trend with stabilization noted -INR improving to 3.0 today, continue to monitor -Noted to have iron deficiency -Plan to check stool occult   HFpEF with RV dysfunction -2D echocardiogram 04/2022 EF 70-75% -Continue tadalafil and Opsumit -Continue Demadex at increased  dose, careful with diuresis -Farxiga   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 07/2022 -Hold Lipitor given transaminitis   Type 2 diabetes with hyperglycemia -Continue SGLT2i -Continue carb modified diet -Add SSI   Permanent atrial fibrillation -Bradycardic and not on AV nodal blockade agents -Currently on Coumadin with supratherapeutic INR -Hold Coumadin for now, appreciate pharmacy assistance -Vitamin K given 1/24   OSA on CPAP -Continue CPAP at night   CKD stage IIIb-4 -Baseline creatinine around 1.8   Dyslipidemia -Holding Lipitor   Hypertension -Continue hydralazine and Norvasc   DVT prophylaxis: Supratherapeutic INR-improving Code Status: Full Family Communication: None at bedside Disposition Plan:  Status is: Inpatient Remains inpatient appropriate because: Need for IV antibiotics.   Consultants:  None  Procedures:  None  Antimicrobials:  Anti-infectives (From admission, onward)    Start     Dose/Rate Route Frequency Ordered Stop   11/14/23 1100  cefTRIAXone (ROCEPHIN) 1 g in sodium chloride 0.9 % 100 mL IVPB        1 g 200 mL/hr over 30 Minutes Intravenous Every 24 hours 11/13/23 1549     11/14/23 1000  azithromycin (ZITHROMAX) 500 mg in sodium chloride 0.9 % 250 mL IVPB        500 mg 250 mL/hr over 60 Minutes Intravenous Every 24 hours 11/13/23 1549     11/13/23 1115  cefTRIAXone (ROCEPHIN) 1 g in sodium chloride 0.9 % 100 mL IVPB        1 g 200 mL/hr over 30 Minutes Intravenous  Once 11/13/23 1111 11/13/23 1154   11/13/23 1115  azithromycin (ZITHROMAX) 500 mg in sodium chloride 0.9 % 250 mL IVPB        500 mg 250 mL/hr over 60 Minutes Intravenous  Once 11/13/23 1111 11/13/23 1231      Subjective: Patient seen and  evaluated today with no new acute complaints or concerns. No acute concerns or events noted overnight.  He is starting to feel better, but continues to have elevated oxygen requirement.  Objective: Vitals:   11/16/23 1935  11/17/23 0420 11/17/23 0753 11/17/23 1100  BP: (!) 124/43 (!) 129/41    Pulse: (!) 55     Resp: 15     Temp: 97.7 F (36.5 C) (!) 97.5 F (36.4 C)    TempSrc: Oral Oral    SpO2: 94%  96%   Weight:    79.2 kg  Height:        Intake/Output Summary (Last 24 hours) at 11/17/2023 1155 Last data filed at 11/17/2023 1110 Gross per 24 hour  Intake 360 ml  Output 750 ml  Net -390 ml   Filed Weights   11/13/23 1020 11/17/23 1100  Weight: 80 kg 79.2 kg    Examination:  General exam: Appears calm and comfortable  Respiratory system: Bilateral mild crackles. Respiratory effort normal.  Currently on 4 L nasal cannula. Cardiovascular system: S1 & S2 heard, RRR.  Gastrointestinal system: Abdomen is soft Central nervous system: Alert and awake Extremities: No edema Skin: No significant lesions noted Psychiatry: Flat affect.    Data Reviewed: I have personally reviewed following labs and imaging studies  CBC: Recent Labs  Lab 11/13/23 1050 11/14/23 0523 11/15/23 0508 11/15/23 1810 11/16/23 0506 11/17/23 0416  WBC 38.3* 31.6* 25.4*  --  20.6* 23.8*  NEUTROABS 34.5*  --  22.4*  --   --   --   HGB 10.2* 9.2* 8.8* 8.6* 8.2* 8.1*  HCT 30.9* 28.5* 27.5* 26.7* 26.2* 25.7*  MCV 88.5 89.3 89.9  --  89.4 89.5  PLT 249 242 260  --  269 290   Basic Metabolic Panel: Recent Labs  Lab 11/13/23 1050 11/14/23 0523 11/15/23 0508 11/16/23 0506 11/17/23 0416  NA 136 137 134* 136 134*  K 4.2 3.8 3.9 4.6 4.5  CL 98 98 97* 100 97*  CO2 26 27 24 23 24   GLUCOSE 289* 265* 172* 273* 273*  BUN 78* 81* 87* 101* 117*  CREATININE 2.52* 2.33* 2.46* 2.61* 2.78*  CALCIUM 8.9 8.5* 8.3* 8.1* 7.9*  MG 2.7* 2.7* 2.5* 2.7* 2.8*   GFR: Estimated Creatinine Clearance: 19.2 mL/min (A) (by C-G formula based on SCr of 2.78 mg/dL (H)). Liver Function Tests: Recent Labs  Lab 11/13/23 1050 11/14/23 0523 11/15/23 0508 11/16/23 0506 11/17/23 0416  AST 67* 53* 74* 51* 37  ALT 54* 47* 53* 47* 41   ALKPHOS 175* 163* 200* 185* 158*  BILITOT 1.4* 0.9 0.9 0.9 0.8  PROT 7.5 6.5 6.6 6.6 6.3*  ALBUMIN 2.9* 2.4* 2.4* 2.3* 2.3*   No results for input(s): "LIPASE", "AMYLASE" in the last 168 hours. No results for input(s): "AMMONIA" in the last 168 hours. Coagulation Profile: Recent Labs  Lab 11/13/23 1050 11/14/23 0523 11/15/23 0508 11/16/23 0506 11/17/23 0416  INR 6.7* 7.2* 6.7* 3.5* 3.0*   Cardiac Enzymes: No results for input(s): "CKTOTAL", "CKMB", "CKMBINDEX", "TROPONINI" in the last 168 hours. BNP (last 3 results) No results for input(s): "PROBNP" in the last 8760 hours. HbA1C: No results for input(s): "HGBA1C" in the last 72 hours.  CBG: Recent Labs  Lab 11/16/23 1054 11/16/23 1624 11/16/23 2054 11/17/23 0709 11/17/23 1111  GLUCAP 315* 319* 298* 249* 276*   Lipid Profile: No results for input(s): "CHOL", "HDL", "LDLCALC", "TRIG", "CHOLHDL", "LDLDIRECT" in the last 72 hours. Thyroid Function Tests: No results for  input(s): "TSH", "T4TOTAL", "FREET4", "T3FREE", "THYROIDAB" in the last 72 hours. Anemia Panel: Recent Labs    11/15/23 0508  VITAMINB12 1,876*  FOLATE 18.9  FERRITIN 217  TIBC 181*  IRON 19*  RETICCTPCT 2.5   Sepsis Labs: Recent Labs  Lab 11/13/23 1305 11/13/23 1449  LATICACIDVEN 1.2 1.2    Recent Results (from the past 240 hours)  Resp panel by RT-PCR (RSV, Flu A&B, Covid) Anterior Nasal Swab     Status: None   Collection Time: 11/13/23 10:25 AM   Specimen: Anterior Nasal Swab  Result Value Ref Range Status   SARS Coronavirus 2 by RT PCR NEGATIVE NEGATIVE Final    Comment: (NOTE) SARS-CoV-2 target nucleic acids are NOT DETECTED.  The SARS-CoV-2 RNA is generally detectable in upper respiratory specimens during the acute phase of infection. The lowest concentration of SARS-CoV-2 viral copies this assay can detect is 138 copies/mL. A negative result does not preclude SARS-Cov-2 infection and should not be used as the sole basis for  treatment or other patient management decisions. A negative result may occur with  improper specimen collection/handling, submission of specimen other than nasopharyngeal swab, presence of viral mutation(s) within the areas targeted by this assay, and inadequate number of viral copies(<138 copies/mL). A negative result must be combined with clinical observations, patient history, and epidemiological information. The expected result is Negative.  Fact Sheet for Patients:  BloggerCourse.com  Fact Sheet for Healthcare Providers:  SeriousBroker.it  This test is no t yet approved or cleared by the [place] FDA and  has been authorized for detection and/or diagnosis of SARS-CoV-2 by FDA under an Emergency Use Authorization (EUA). This EUA will remain  in effect (meaning this test can be used) for the duration of the COVID-19 declaration under Section 564(b)(1) of the Act, 21 U.S.C.section 360bbb-3(b)(1), unless the authorization is terminated  or revoked sooner.       Influenza A by PCR NEGATIVE NEGATIVE Final   Influenza B by PCR NEGATIVE NEGATIVE Final    Comment: (NOTE) The Xpert Xpress SARS-CoV-2/FLU/RSV plus assay is intended as an aid in the diagnosis of influenza from Nasopharyngeal swab specimens and should not be used as a sole basis for treatment. Nasal washings and aspirates are unacceptable for Xpert Xpress SARS-CoV-2/FLU/RSV testing.  Fact Sheet for Patients: BloggerCourse.com  Fact Sheet for Healthcare Providers: SeriousBroker.it  This test is not yet approved or cleared by the [place] FDA and has been authorized for detection and/or diagnosis of SARS-CoV-2 by FDA under an Emergency Use Authorization (EUA). This EUA will remain in effect (meaning this test can be used) for the duration of the COVID-19 declaration under Section 564(b)(1) of the Act, 21  U.S.C. section 360bbb-3(b)(1), unless the authorization is terminated or revoked.     Resp Syncytial Virus by PCR NEGATIVE NEGATIVE Final    Comment: (NOTE) Fact Sheet for Patients: BloggerCourse.com  Fact Sheet for Healthcare Providers: SeriousBroker.it  This test is not yet approved or cleared by the [place] FDA and has been authorized for detection and/or diagnosis of SARS-CoV-2 by FDA under an Emergency Use Authorization (EUA). This EUA will remain in effect (meaning this test can be used) for the duration of the COVID-19 declaration under Section 564(b)(1) of the Act, 21 U.S.C. section 360bbb-3(b)(1), unless the authorization is terminated or revoked.  Performed at [facility], [address] [place], [place] [address]   Blood culture (routine x 2)     Status: None (Preliminary result)   Collection Time:  11/13/23  1:05 PM   Specimen: BLOOD  Result Value Ref Range Status   Specimen Description BLOOD BLOOD LEFT HAND  Final   Special Requests   Final    Blood Culture results may not be optimal due to an inadequate volume of blood received in culture bottles BOTTLES DRAWN AEROBIC AND ANAEROBIC   Culture   Final    NO GROWTH 4 DAYS Performed at [facility], [address] [place], [place] [address]    Report Status PENDING  Incomplete  Blood culture (routine x 2)     Status: None (Preliminary result)   Collection Time: 11/13/23  1:05 PM   Specimen: BLOOD  Result Value Ref Range Status   Specimen Description BLOOD BLOOD RIGHT HAND  Final   Special Requests   Final    Blood Culture results may not be optimal due to an inadequate volume of blood received in culture bottles BOTTLES DRAWN AEROBIC ONLY   Culture   Final    NO GROWTH 4 DAYS Performed at [facility], [address] [place], [place] [address]    Report Status PENDING  Incomplete         Radiology Studies: DG CHEST PORT 1 VIEW Result Date:  11/15/2023 CLINICAL DATA:  Dyspnea EXAM: PORTABLE CHEST 1 VIEW COMPARISON:  11/13/2023 FINDINGS: Cardiomegaly with vascular congestion and small right effusion. Multifocal airspace opacities suggestive of pneumonia. No pneumothorax IMPRESSION: Cardiomegaly with vascular congestion and small right effusion. Multifocal airspace opacities suggestive of pneumonia. Electronically Signed   By: Jasmine Pang M.D.   On: 11/15/2023 19:55        Scheduled Meds:  amLODipine  10 mg Oral Daily   dapagliflozin propanediol  10 mg Oral Daily   dextromethorphan-guaiFENesin  1 tablet Oral BID   hydrALAZINE  25 mg Oral BID   insulin aspart  0-5 Units Subcutaneous QHS   insulin aspart  0-9 Units Subcutaneous TID WC   ipratropium-albuterol  3 mL Nebulization TID   macitentan  10 mg Oral Daily   methylPREDNISolone (SOLU-MEDROL) injection  40 mg Intravenous Q12H   metolazone  5 mg Oral Once   tadalafil  20 mg Oral BID   [START ON 11/18/2023] torsemide  80 mg Oral q morning   And   torsemide  80 mg Oral QPM   warfarin  5 mg Oral ONCE-1600   Warfarin - Pharmacist Dosing Inpatient   Does not apply q1600   Continuous Infusions:  azithromycin 500 mg (11/17/23 0938)   cefTRIAXone (ROCEPHIN)  IV 1 g (11/17/23 1055)     LOS: 4 days    Time spent: 55 minutes    Tylicia Sherman D [name], DO [facility]  If 7PM-7AM, please contact night-coverage [email or web address] 11/17/2023, 11:55 AM

## 2023-11-17 NOTE — Progress Notes (Signed)
   11/17/23 1200  ReDS Vest / Clip  Station Marker C  Ruler Value 38  ReDS Value Range  (low quality)  ReDS Actual Value  (low quality)

## 2023-11-18 DIAGNOSIS — J9601 Acute respiratory failure with hypoxia: Secondary | ICD-10-CM | POA: Diagnosis not present

## 2023-11-18 LAB — CULTURE, BLOOD (ROUTINE X 2)
Culture: NO GROWTH
Culture: NO GROWTH

## 2023-11-18 LAB — CBC
HCT: 25.6 % — ABNORMAL LOW (ref 39.0–52.0)
Hemoglobin: 8.3 g/dL — ABNORMAL LOW (ref 13.0–17.0)
MCH: 28.9 pg (ref 26.0–34.0)
MCHC: 32.4 g/dL (ref 30.0–36.0)
MCV: 89.2 fL (ref 80.0–100.0)
Platelets: 318 10*3/uL (ref 150–400)
RBC: 2.87 MIL/uL — ABNORMAL LOW (ref 4.22–5.81)
RDW: 16.1 % — ABNORMAL HIGH (ref 11.5–15.5)
WBC: 20.1 10*3/uL — ABNORMAL HIGH (ref 4.0–10.5)
nRBC: 0 % (ref 0.0–0.2)

## 2023-11-18 LAB — BASIC METABOLIC PANEL
Anion gap: 13 (ref 5–15)
BUN: 123 mg/dL — ABNORMAL HIGH (ref 8–23)
CO2: 25 mmol/L (ref 22–32)
Calcium: 8 mg/dL — ABNORMAL LOW (ref 8.9–10.3)
Chloride: 96 mmol/L — ABNORMAL LOW (ref 98–111)
Creatinine, Ser: 2.58 mg/dL — ABNORMAL HIGH (ref 0.61–1.24)
GFR, Estimated: 24 mL/min — ABNORMAL LOW (ref >60)
Glucose, Bld: 280 mg/dL — ABNORMAL HIGH (ref 70–99)
Potassium: 4.3 mmol/L (ref 3.5–5.1)
Sodium: 134 mmol/L — ABNORMAL LOW (ref 135–145)

## 2023-11-18 LAB — GLUCOSE, CAPILLARY
Glucose-Capillary: 287 mg/dL — ABNORMAL HIGH (ref 70–99)
Glucose-Capillary: 234 mg/dL — ABNORMAL HIGH (ref 70–99)
Glucose-Capillary: 321 mg/dL — ABNORMAL HIGH (ref 70–99)

## 2023-11-18 LAB — PROTIME-INR
INR: 2.6 — ABNORMAL HIGH (ref 0.8–1.2)
Prothrombin Time: 28.1 s — ABNORMAL HIGH (ref 11.4–15.2)

## 2023-11-18 LAB — MAGNESIUM: Magnesium: 2.8 mg/dL — ABNORMAL HIGH (ref 1.7–2.4)

## 2023-11-18 MED ORDER — ORAL CARE MOUTH RINSE: 15.0000 mL | OROMUCOSAL | Status: DC | PRN

## 2023-11-18 MED ORDER — FUROSEMIDE 10 MG/ML IJ SOLN
80.0000 mg | Freq: Three times a day (TID) | INTRAMUSCULAR | Status: DC
  Administered 2023-11-18 – 2023-11-22 (×13): 80 mg via INTRAVENOUS
  Filled 2023-11-18 (×15): qty 8

## 2023-11-18 MED ORDER — IPRATROPIUM-ALBUTEROL 0.5-2.5 (3) MG/3ML IN SOLN
3.0000 mL | Freq: Two times a day (BID) | RESPIRATORY_TRACT | Status: DC
  Administered 2023-11-18 – 2023-11-22 (×9): 3 mL via RESPIRATORY_TRACT
  Filled 2023-11-18 (×9): qty 3

## 2023-11-18 MED ORDER — INSULIN ASPART 100 UNIT/ML IJ SOLN
3.0000 [IU] | Freq: Three times a day (TID) | INTRAMUSCULAR | Status: DC
  Administered 2023-11-18 – 2023-11-20 (×6): 3 [IU] via SUBCUTANEOUS

## 2023-11-18 MED ORDER — WARFARIN SODIUM 5 MG PO TABS
5.0000 mg | ORAL_TABLET | Freq: Once | ORAL | Status: AC
  Administered 2023-11-18: 5 mg via ORAL
  Filled 2023-11-18: qty 1

## 2023-11-18 NOTE — Progress Notes (Signed)
Mobility Specialist Progress Note:    11/18/23 1320  Mobility  Activity Transferred to/from [facility]  Level of Assistance Contact guard assist, steadying assist  Assistive Device None  Distance Ambulated (ft) 5 ft  Range of Motion/Exercises Active;All extremities  Activity Response Tolerated well  Mobility Referral Yes  Mobility visit 1 Mobility  Mobility Specialist Start Time (ACUTE ONLY) 1320  Mobility Specialist Stop Time (ACUTE ONLY) 1335  Mobility Specialist Time Calculation (min) (ACUTE ONLY) 15 min   Pt received in chair, wife in room, requesting assistance to [facility]. Required CGA to stand and ambulate with no AD. Tolerated well, asx throughout. Returned pt to chair, call bell in reach. All needs met.  Robert Baldwin Mobility Specialist Please contact via Special educational needs teacher or  Rehab office at [phone]

## 2023-11-18 NOTE — Progress Notes (Signed)
Patient has Bipap order PRN but not needed at this time.

## 2023-11-18 NOTE — Plan of Care (Signed)
  Problem: Acute Rehab PT Goals(only PT should resolve) Goal: Pt Will Go Supine/Side To Sit Outcome: Progressing Flowsheets (Taken 11/18/2023 1344) Pt will go Supine/Side to Sit: Independently Goal: Patient Will Transfer Sit To/From Stand Outcome: Progressing Flowsheets (Taken 11/18/2023 1344) Patient will transfer sit to/from stand: Independently Goal: Pt Will Transfer Bed To Chair/Chair To Bed Outcome: Progressing Flowsheets (Taken 11/18/2023 1344) Pt will Transfer Bed to Chair/Chair to Bed: with modified independence Goal: Pt Will Ambulate Outcome: Progressing Flowsheets (Taken 11/18/2023 1344) Pt will Ambulate:  100 feet  with least restrictive assistive device  with modified independence   1:45 PM, 11/18/23 Ocie Bob, MPT Physical Therapist with [facility] [phone] office [phone] mobile phone

## 2023-11-18 NOTE — Evaluation (Signed)
Physical Therapy Evaluation Patient Details Name: Robert Baldwin MRN: [id number] DOB: [birth year] Today's Date: 11/18/2023  History of Present Illness  Robert Baldwin is a 83 y.o. male with medical history significant for type 2 diabetes, hypertension, atrial fibrillation on Coumadin, dyslipidemia, HFpEF, pulmonary hypertension, and CKD stage IIIb-IV, who presented to the ED with worsening cough and shortness of breath along with generalized weakness.  He apparently went to his PCP office today and was found to have O2 saturation of 70% on room air as well as soft blood pressures and was advised to come to the ED for further evaluation.  He apparently went to his PCP about a week ago and at that time was placed on some prednisone which did not help.  He denies any current discomfort and states that he is trying to cough, but cannot get anything up.  He has had some intermittent fevers and chills according to the wife at bedside.  He continues to take his medications as otherwise prescribed.   Clinical Impression  Patient demonstrate good return for bed mobility, transfers and ambulating in room without loss of balance or need for an AD, did have 1 near loss of balance when turning around at bedside, but able to self correct with CGA.  Patient on 4 LPM with Spo2 dropping to 82% during ambulation and tolerated staying up in chair with SpO2 increasing above 90% after resting for 3-4 minutes.  Patient will benefit from continued skilled physical therapy in hospital and recommended venue below to increase strength, balance, endurance for safe ADLs and gait.         If plan is discharge home, recommend the following: A little help with walking and/or transfers;A little help with bathing/dressing/bathroom;Help with stairs or ramp for entrance;Assistance with cooking/housework   Can travel by private vehicle        Equipment Recommendations None recommended by PT  Recommendations for Other Services        Functional Status Assessment Patient has had a recent decline in their functional status and demonstrates the ability to make significant improvements in function in a reasonable and predictable amount of time.     Precautions / Restrictions Precautions Precautions: Fall Restrictions Weight Bearing Restrictions Per Provider Order: No      Mobility  Bed Mobility Overal bed mobility: Modified Independent                  Transfers Overall transfer level: Modified independent                      Ambulation/Gait Ambulation/Gait assistance: Contact guard assist Gait Distance (Feet): 50 Feet Assistive device: None Gait Pattern/deviations: Decreased step length - left, Decreased stance time - right, Decreased stride length Gait velocity: decreased     General Gait Details: lightly labored movement with good return for ambulating in room without loss of balance or need for an assistance device, limited mostly due to fatigue and SpO2 dropping from 95% to 82% while on 4 LPM O2  Stairs            Wheelchair Mobility     Tilt Bed    Modified Rankin (Stroke Patients Only)       Balance Overall balance assessment: Needs assistance Sitting-balance support: Feet supported, No upper extremity supported Sitting balance-Leahy Scale: Good Sitting balance - Comments: seated at EOB   Standing balance support: During functional activity, No upper extremity supported Standing balance-Leahy Scale: Fair Standing balance comment:  fair/good                             Pertinent Vitals/Pain Pain Assessment Pain Assessment: No/denies pain    Home Living Family/patient expects to be discharged to:: Private residence Living Arrangements: Spouse/significant other Available Help at Discharge: Family;Available 24 hours/day Type of Home: House Home Access: Stairs to enter Entrance Stairs-Rails: Left Entrance Stairs-Number of Steps: 6 steps in front, 1  step to get into laundry room   Home Layout: One level Home Equipment: Agricultural consultant (2 wheels);Cane - single point;BSC/3in1;Shower seat      Prior Function Prior Level of Function : Independent/Modified Independent             Mobility Comments: Community ambulation without AD ADLs Comments: Independent     Extremity/Trunk Assessment   Upper Extremity Assessment Upper Extremity Assessment: Overall WFL for tasks assessed    Lower Extremity Assessment Lower Extremity Assessment: Generalized weakness    Cervical / Trunk Assessment Cervical / Trunk Assessment: Normal  Communication   Communication Communication: No apparent difficulties  Cognition Arousal: Alert Behavior During Therapy: WFL for tasks assessed/performed Overall Cognitive Status: Within Functional Limits for tasks assessed                                          General Comments      Exercises     Assessment/Plan    PT Assessment Patient needs continued PT services  PT Problem List Decreased strength;Decreased activity tolerance;Decreased balance;Decreased mobility       PT Treatment Interventions DME instruction;Gait training;Stair training;Functional mobility training;Therapeutic activities;Therapeutic exercise;Balance training;Patient/family education    PT Goals (Current goals can be found in the Care Plan section)  Acute Rehab PT Goals Patient Stated Goal: return home with family to assist PT Goal Formulation: With patient/family Time For Goal Achievement: 11/22/23 Potential to Achieve Goals: Good    Frequency Min 3X/week     Co-evaluation               AM-PAC PT "6 Clicks" Mobility  Outcome Measure Help needed turning from your back to your side while in a flat bed without using bedrails?: None Help needed moving from lying on your back to sitting on the side of a flat bed without using bedrails?: None Help needed moving to and from a bed to a chair  (including a wheelchair)?: A Little Help needed standing up from a chair using your arms (e.g., wheelchair or bedside chair)?: A Little Help needed to walk in hospital room?: A Little Help needed climbing 3-5 steps with a railing? : A Little 6 Click Score: 20    End of Session Equipment Utilized During Treatment: Oxygen Activity Tolerance: Patient tolerated treatment well;Patient limited by fatigue Patient left: in chair;with call bell/phone within reach;with family/visitor present Nurse Communication: Mobility status PT Visit Diagnosis: Unsteadiness on feet (R26.81);Other abnormalities of gait and mobility (R26.89);Muscle weakness (generalized) (M62.81)    Time: 1610-9604 PT Time Calculation (min) (ACUTE ONLY): 30 min   Charges:   PT Evaluation $PT Eval Moderate Complexity: 1 Mod PT Treatments $Therapeutic Activity: 23-37 mins PT General Charges $$ ACUTE PT VISIT: 1 Visit         1:43 PM, 11/18/23 Ocie Bob, MPT Physical Therapist with [facility] [phone] office [phone] mobile phone

## 2023-11-18 NOTE — Progress Notes (Signed)
PROGRESS NOTE    Robert Baldwin  WUJ:[id number] DOB: 1940-12-29 DOA: 11/13/2023 PCP: Kirstie Peri, MD   Brief Narrative:    Robert Baldwin is a 83 y.o. male with medical history significant for type 2 diabetes, hypertension, atrial fibrillation on Coumadin, dyslipidemia, HFpEF, pulmonary hypertension, and CKD stage IIIb-IV, who presented to the ED with worsening cough and shortness of breath along with generalized weakness.  Patient was admitted with acute hypoxemic respiratory failure secondary to multifocal community-acquired pneumonia and was started on empiric antibiotics.  Assessment & Plan:   Principal Problem:   Acute hypoxemic respiratory failure (HCC) Active Problems:   Mixed hyperlipidemia   Essential hypertension, benign   Atrial fibrillation with slow ventricular response (HCC)   Severe pulmonary hypertension (HCC)  Assessment and Plan:   Acute hypoxemic respiratory failure secondary to multifocal CAP and pulmonary edema -Continue on azithromycin and Rocephin and monitor CBC -Monitor cultures -Wean oxygen as tolerated -Respiratory viral panel ordered and pending, flu and COVID and RSV negative -Blood cultures negative -Continue scheduled breathing treatments and Mucinex -Start IV Solu-Medrol twice daily 1/24 -Up in chair and incentive spirometry encouraged -Chest x-ray 1/24 with cardiomegaly and pulmonary edema ongoing torsemide use increased to 80 mg twice daily  Transaminitis with alkaline phosphatase elevation-improved -Acute hepatitis panel negative -Ultrasound abdomen without any significant findings -Hold statin -Monitor a.m. labs  Worsening anemia-now stable -No overt bleeding noted, continue to follow trend with stabilization noted -INR improving to 3.0 today, continue to monitor -Noted to have iron deficiency, avoiding repletion during active infection -Plan to check stool occult   HFpEF with RV dysfunction -2D echocardiogram 04/2022 EF 70-75% -Continue  tadalafil and Opsumit -Continue Demadex at increased dose, careful with diuresis -Farxiga   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 07/2022 -Hold Lipitor given transaminitis   Type 2 diabetes with hyperglycemia -Continue SGLT2i -Continue carb modified diet -Add SSI   Permanent atrial fibrillation -Bradycardic and not on AV nodal blockade agents -Currently on Coumadin with supratherapeutic INR -Hold Coumadin for now, appreciate pharmacy assistance -Vitamin K given 1/24   OSA on CPAP -Continue CPAP at night   CKD stage IIIb-4 -Baseline creatinine around 1.8   Dyslipidemia -Holding Lipitor   Hypertension -Continue hydralazine and Norvasc   DVT prophylaxis: Warfarin Code Status: Full Family Communication: None at bedside Disposition Plan:  Status is: Inpatient Remains inpatient appropriate because: Need for IV antibiotics.   Consultants:  None  Procedures:  None  Antimicrobials:  Anti-infectives (From admission, onward)    Start     Dose/Rate Route Frequency Ordered Stop   11/14/23 1100  cefTRIAXone (ROCEPHIN) 1 g in sodium chloride 0.9 % 100 mL IVPB        1 g 200 mL/hr over 30 Minutes Intravenous Every 24 hours 11/13/23 1549     11/14/23 1000  azithromycin (ZITHROMAX) 500 mg in sodium chloride 0.9 % 250 mL IVPB        500 mg 250 mL/hr over 60 Minutes Intravenous Every 24 hours 11/13/23 1549     11/13/23 1115  cefTRIAXone (ROCEPHIN) 1 g in sodium chloride 0.9 % 100 mL IVPB        1 g 200 mL/hr over 30 Minutes Intravenous  Once 11/13/23 1111 11/13/23 1154   11/13/23 1115  azithromycin (ZITHROMAX) 500 mg in sodium chloride 0.9 % 250 mL IVPB        500 mg 250 mL/hr over 60 Minutes Intravenous  Once 11/13/23 1111 11/13/23 1231  Subjective: Patient seen and evaluated today with no new acute complaints or concerns. No acute concerns or events noted overnight.  He is starting to feel better, but continues to have elevated oxygen  requirement.  Objective: Vitals:   11/17/23 2256 11/18/23 0251 11/18/23 0300 11/18/23 0713  BP: (!) 129/41 (!) 144/42    Pulse:  (!) 49    Resp:  18    Temp:  98.2 F (36.8 C)    TempSrc:      SpO2:  95%  95%  Weight:   78.7 kg   Height:        Intake/Output Summary (Last 24 hours) at 11/18/2023 1018 Last data filed at 11/17/2023 2354 Gross per 24 hour  Intake 840 ml  Output 800 ml  Net 40 ml   Filed Weights   11/13/23 1020 11/17/23 1100 11/18/23 0300  Weight: 80 kg 79.2 kg 78.7 kg    Examination:  General exam: Appears calm and comfortable  Respiratory system: Bilateral mild crackles. Respiratory effort normal.  Currently on 4 L nasal cannula. Cardiovascular system: S1 & S2 heard, RRR.  Gastrointestinal system: Abdomen is soft Central nervous system: Alert and awake Extremities: No edema Skin: No significant lesions noted Psychiatry: Flat affect.    Data Reviewed: I have personally reviewed following labs and imaging studies  CBC: Recent Labs  Lab 11/13/23 1050 11/14/23 0523 11/15/23 0508 11/15/23 1810 11/16/23 0506 11/17/23 0416 11/18/23 0407  WBC 38.3* 31.6* 25.4*  --  20.6* 23.8* 20.1*  NEUTROABS 34.5*  --  22.4*  --   --   --   --   HGB 10.2* 9.2* 8.8* 8.6* 8.2* 8.1* 8.3*  HCT 30.9* 28.5* 27.5* 26.7* 26.2* 25.7* 25.6*  MCV 88.5 89.3 89.9  --  89.4 89.5 89.2  PLT 249 242 260  --  269 290 318   Basic Metabolic Panel: Recent Labs  Lab 11/14/23 0523 11/15/23 0508 11/16/23 0506 11/17/23 0416 11/18/23 0407  NA 137 134* 136 134* 134*  K 3.8 3.9 4.6 4.5 4.3  CL 98 97* 100 97* 96*  CO2 27 24 23 24 25   GLUCOSE 265* 172* 273* 273* 280*  BUN 81* 87* 101* 117* 123*  CREATININE 2.33* 2.46* 2.61* 2.78* 2.58*  CALCIUM 8.5* 8.3* 8.1* 7.9* 8.0*  MG 2.7* 2.5* 2.7* 2.8* 2.8*   GFR: Estimated Creatinine Clearance: 20.6 mL/min (A) (by C-G formula based on SCr of 2.58 mg/dL (H)). Liver Function Tests: Recent Labs  Lab 11/13/23 1050 11/14/23 0523  11/15/23 0508 11/16/23 0506 11/17/23 0416  AST 67* 53* 74* 51* 37  ALT 54* 47* 53* 47* 41  ALKPHOS 175* 163* 200* 185* 158*  BILITOT 1.4* 0.9 0.9 0.9 0.8  PROT 7.5 6.5 6.6 6.6 6.3*  ALBUMIN 2.9* 2.4* 2.4* 2.3* 2.3*   No results for input(s): "LIPASE", "AMYLASE" in the last 168 hours. No results for input(s): "AMMONIA" in the last 168 hours. Coagulation Profile: Recent Labs  Lab 11/14/23 0523 11/15/23 0508 11/16/23 0506 11/17/23 0416 11/18/23 0407  INR 7.2* 6.7* 3.5* 3.0* 2.6*   Cardiac Enzymes: No results for input(s): "CKTOTAL", "CKMB", "CKMBINDEX", "TROPONINI" in the last 168 hours. BNP (last 3 results) No results for input(s): "PROBNP" in the last 8760 hours. HbA1C: No results for input(s): "HGBA1C" in the last 72 hours.  CBG: Recent Labs  Lab 11/17/23 1111 11/17/23 1628 11/17/23 1955 11/17/23 2253 11/18/23 0704  GLUCAP 276* 344* 366* 282* 287*   Lipid Profile: No results for input(s): "CHOL", "HDL", "LDLCALC", "  TRIG", "CHOLHDL", "LDLDIRECT" in the last 72 hours. Thyroid Function Tests: No results for input(s): "TSH", "T4TOTAL", "FREET4", "T3FREE", "THYROIDAB" in the last 72 hours. Anemia Panel: No results for input(s): "VITAMINB12", "FOLATE", "FERRITIN", "TIBC", "IRON", "RETICCTPCT" in the last 72 hours.  Sepsis Labs: Recent Labs  Lab 11/13/23 1305 11/13/23 1449  LATICACIDVEN 1.2 1.2    Recent Results (from the past 240 hours)  Resp panel by RT-PCR (RSV, Flu A&B, Covid) Anterior Nasal Swab     Status: None   Collection Time: 11/13/23 10:25 AM   Specimen: Anterior Nasal Swab  Result Value Ref Range Status   SARS Coronavirus 2 by RT PCR NEGATIVE NEGATIVE Final    Comment: (NOTE) SARS-CoV-2 target nucleic acids are NOT DETECTED.  The SARS-CoV-2 RNA is generally detectable in upper respiratory specimens during the acute phase of infection. The lowest concentration of SARS-CoV-2 viral copies this assay can detect is 138 copies/mL. A negative result  does not preclude SARS-Cov-2 infection and should not be used as the sole basis for treatment or other patient management decisions. A negative result may occur with  improper specimen collection/handling, submission of specimen other than nasopharyngeal swab, presence of viral mutation(s) within the areas targeted by this assay, and inadequate number of viral copies(<138 copies/mL). A negative result must be combined with clinical observations, patient history, and epidemiological information. The expected result is Negative.  Fact Sheet for Patients:  BloggerCourse.com  Fact Sheet for Healthcare Providers:  SeriousBroker.it  This test is no t yet approved or cleared by the [place] FDA and  has been authorized for detection and/or diagnosis of SARS-CoV-2 by FDA under an Emergency Use Authorization (EUA). This EUA will remain  in effect (meaning this test can be used) for the duration of the COVID-19 declaration under Section 564(b)(1) of the Act, 21 U.S.C.section 360bbb-3(b)(1), unless the authorization is terminated  or revoked sooner.       Influenza A by PCR NEGATIVE NEGATIVE Final   Influenza B by PCR NEGATIVE NEGATIVE Final    Comment: (NOTE) The Xpert Xpress SARS-CoV-2/FLU/RSV plus assay is intended as an aid in the diagnosis of influenza from Nasopharyngeal swab specimens and should not be used as a sole basis for treatment. Nasal washings and aspirates are unacceptable for Xpert Xpress SARS-CoV-2/FLU/RSV testing.  Fact Sheet for Patients: BloggerCourse.com  Fact Sheet for Healthcare Providers: SeriousBroker.it  This test is not yet approved or cleared by the [place] FDA and has been authorized for detection and/or diagnosis of SARS-CoV-2 by FDA under an Emergency Use Authorization (EUA). This EUA will remain in effect (meaning this test can be used) for  the duration of the COVID-19 declaration under Section 564(b)(1) of the Act, 21 U.S.C. section 360bbb-3(b)(1), unless the authorization is terminated or revoked.     Resp Syncytial Virus by PCR NEGATIVE NEGATIVE Final    Comment: (NOTE) Fact Sheet for Patients: BloggerCourse.com  Fact Sheet for Healthcare Providers: SeriousBroker.it  This test is not yet approved or cleared by the [place] FDA and has been authorized for detection and/or diagnosis of SARS-CoV-2 by FDA under an Emergency Use Authorization (EUA). This EUA will remain in effect (meaning this test can be used) for the duration of the COVID-19 declaration under Section 564(b)(1) of the Act, 21 U.S.C. section 360bbb-3(b)(1), unless the authorization is terminated or revoked.  Performed at [facility], [address], [place], [place] [address]   Blood culture (routine x 2)     Status: None   Collection  Time: 11/13/23  1:05 PM   Specimen: BLOOD  Result Value Ref Range Status   Specimen Description BLOOD BLOOD LEFT HAND  Final   Special Requests   Final    Blood Culture results may not be optimal due to an inadequate volume of blood received in culture bottles BOTTLES DRAWN AEROBIC AND ANAEROBIC   Culture   Final    NO GROWTH 5 DAYS Performed at [facility], [address], [place], [place] [address]    Report Status 11/18/2023 FINAL  Final  Blood culture (routine x 2)     Status: None   Collection Time: 11/13/23  1:05 PM   Specimen: BLOOD  Result Value Ref Range Status   Specimen Description BLOOD BLOOD RIGHT HAND  Final   Special Requests   Final    Blood Culture results may not be optimal due to an inadequate volume of blood received in culture bottles BOTTLES DRAWN AEROBIC ONLY   Culture   Final    NO GROWTH 5 DAYS Performed at [facility], [address], [place], [place] [address]    Report Status 11/18/2023 FINAL  Final         Radiology  Studies: No results found.       Scheduled Meds:  amLODipine  10 mg Oral Daily   dapagliflozin propanediol  10 mg Oral Daily   dextromethorphan-guaiFENesin  1 tablet Oral BID   hydrALAZINE  25 mg Oral BID   insulin aspart  0-5 Units Subcutaneous QHS   insulin aspart  0-9 Units Subcutaneous TID WC   ipratropium-albuterol  3 mL Nebulization BID   macitentan  10 mg Oral Daily   methylPREDNISolone (SOLU-MEDROL) injection  40 mg Intravenous Q12H   polyethylene glycol  17 g Oral Daily   tadalafil  20 mg Oral BID   torsemide  80 mg Oral q morning   And   torsemide  80 mg Oral QPM   warfarin  5 mg Oral ONCE-1600   Warfarin - Pharmacist Dosing Inpatient   Does not apply q1600   Continuous Infusions:  azithromycin 500 mg (11/17/23 0938)   cefTRIAXone (ROCEPHIN)  IV 1 g (11/17/23 1055)     LOS: 5 days    Time spent: 55 minutes    Tansy Lorek D [name], DO [facility]  If 7PM-7AM, please contact night-coverage [email or web address] 11/18/2023, 10:18 AM

## 2023-11-18 NOTE — Plan of Care (Signed)
Problem: Education: Goal: Knowledge of General Education information will improve Description: Including pain rating scale, medication(s)/side effects and non-pharmacologic comfort measures Outcome: Progressing   Problem: Clinical Measurements: Goal: Will remain free from infection Outcome: Progressing   Problem: Clinical Measurements: Goal: Respiratory complications will improve Outcome: Progressing

## 2023-11-18 NOTE — Inpatient Diabetes Management (Signed)
Inpatient Diabetes Program Recommendations  AACE/ADA: New Consensus Statement on Inpatient Glycemic Control (2015)  Target Ranges:  Prepandial:   less than 140 mg/dL      Peak postprandial:   less than 180 mg/dL (1-2 hours)      Critically ill patients:  140 - 180 mg/dL   Lab Results  Component Value Date   GLUCAP 234 (H) 11/18/2023   HGBA1C 7.4 (H) 11/14/2023    Latest Reference Range & Units 11/17/23 16:28 11/17/23 19:55 11/17/23 22:53 11/18/23 07:04 11/18/23 11:48  Glucose-Capillary 70 - 99 mg/dL 409 (H) 811 (H) 914 (H) 287 (H) 234 (H)  (H): Data is abnormally high Review of Glycemic Control  Diabetes history: DM2 Outpatient Diabetes medications: Farxiga 10 mg daily Current orders for Inpatient glycemic control: Novolog 0-9 units correction scale TID, Novolog 0-5 units HS scale  Inpatient Diabetes Program Recommendations:   Noted that blood sugars have been greater than 180 mg/dl.   If blood sugars continue to be elevated, recommend adding Novolog 3 units TID with meals if eating at least 50 % of meal and while on steroids.  Rueb Mince RN BSN CDE Diabetes Coordinator Pager: [phone]  8am-5pm

## 2023-11-18 NOTE — Progress Notes (Signed)
PHARMACY - ANTICOAGULATION CONSULT NOTE  Pharmacy Consult for warfarin Indication: atrial fibrillation  Allergies  Allergen Reactions   Other Rash    Bandaids    Patient Measurements: Height: 5\' 7"  (170.2 cm) Weight: 78.7 kg (173 lb 8 oz) IBW/kg (Calculated) : 66.1  Vital Signs: Temp: 98.2 F (36.8 C) (01/27 0251) Temp Source: Oral (01/26 2033) BP: 144/42 (01/27 0251) Pulse Rate: 49 (01/27 0251)  Labs: Recent Labs    11/16/23 0506 11/17/23 0416 11/18/23 0407  HGB 8.2* 8.1* 8.3*  HCT 26.2* 25.7* 25.6*  PLT 269 290 318  LABPROT 35.2* 31.2* 28.1*  INR 3.5* 3.0* 2.6*  CREATININE 2.61* 2.78* 2.58*    Estimated Creatinine Clearance: 20.6 mL/min (A) (by C-G formula based on SCr of 2.58 mg/dL (H)).   Medical History: Past Medical History:  Diagnosis Date   (HFpEF) heart failure with preserved ejection fraction (HCC)    Arthritis    Atrial fibrillation (HCC)    Bradycardia    Carotid artery disease (HCC)    Hyperlipidemia    Hypertension    Pulmonary hypertension (HCC)    Type 2 diabetes mellitus (HCC)     Assessment: 83 year old male with known afib on chronic warfarin therapy. INR elevated at 7.2. Hemoglobin trending down 10.2>9.2, no bleeding issues noted. Will hold warfarin until INR trends down. Can consider boost supplements for subtle vitamin k replenishment. Will follow CBC closely.  1/24 INR remains elevated as well as LFTs, even subtle worsening. Will administer PO vitamin K today in attempt to treat coagulopathy.  1/25 INR trending down s/p vit K yesterday  1/26 INR continues to trend down, now at upper end of goal. Patient's PO intake is advancing (75% of meals yesterday, up from 50%) and LFTs back WNL.  1/27 INR therapeutic. PO intake remains about the same.    New drug-drug interactions: Azithromycin - potential to enhance warfarin's effect  Goal of Therapy:  INR 2-3 Monitor platelets by anticoagulation protocol: Yes   Plan:  Administer  warfarin 5 mg PO x 1 today (decrease of 2.5 mg from home regimen) Daily INR  Will M. Dareen Piano, PharmD Clinical Pharmacist 11/18/2023 8:27 AM

## 2023-11-19 ENCOUNTER — Inpatient Hospital Stay (HOSPITAL_COMMUNITY): Payer: Medicare PPO

## 2023-11-19 DIAGNOSIS — I272 Pulmonary hypertension, unspecified: Secondary | ICD-10-CM | POA: Diagnosis not present

## 2023-11-19 DIAGNOSIS — J9601 Acute respiratory failure with hypoxia: Secondary | ICD-10-CM | POA: Diagnosis not present

## 2023-11-19 DIAGNOSIS — I5033 Acute on chronic diastolic (congestive) heart failure: Secondary | ICD-10-CM | POA: Diagnosis not present

## 2023-11-19 DIAGNOSIS — R0609 Other forms of dyspnea: Secondary | ICD-10-CM | POA: Diagnosis not present

## 2023-11-19 LAB — ECHOCARDIOGRAM COMPLETE
AR max vel: 2.07 cm2
AV Area VTI: 2.34 cm2
AV Area mean vel: 2.5 cm2
AV Mean grad: 11 mm[Hg]
AV Peak grad: 26.2 mm[Hg]
Ao pk vel: 2.56 m/s
Height: 67 in
MV VTI: 1.84 cm2
S' Lateral: 3.3 cm
Weight: 2740.76 [oz_av]

## 2023-11-19 LAB — GLUCOSE, CAPILLARY
Glucose-Capillary: 348 mg/dL — ABNORMAL HIGH (ref 70–99)
Glucose-Capillary: 238 mg/dL — ABNORMAL HIGH (ref 70–99)
Glucose-Capillary: 284 mg/dL — ABNORMAL HIGH (ref 70–99)
Glucose-Capillary: 245 mg/dL — ABNORMAL HIGH (ref 70–99)
Glucose-Capillary: 280 mg/dL — ABNORMAL HIGH (ref 70–99)

## 2023-11-19 LAB — CBC WITH DIFFERENTIAL/PLATELET
Abs Immature Granulocytes: 0.76 10*3/uL — ABNORMAL HIGH (ref 0.00–0.07)
Basophils Absolute: 0 10*3/uL (ref 0.0–0.1)
Basophils Relative: 0 %
Eosinophils Absolute: 0 10*3/uL (ref 0.0–0.5)
Eosinophils Relative: 0 %
HCT: 26.9 % — ABNORMAL LOW (ref 39.0–52.0)
Hemoglobin: 8.5 g/dL — ABNORMAL LOW (ref 13.0–17.0)
Immature Granulocytes: 5 %
Lymphocytes Relative: 4 %
Lymphs Abs: 0.6 10*3/uL — ABNORMAL LOW (ref 0.7–4.0)
MCH: 28 pg (ref 26.0–34.0)
MCHC: 31.6 g/dL (ref 30.0–36.0)
MCV: 88.5 fL (ref 80.0–100.0)
Monocytes Absolute: 0.6 10*3/uL (ref 0.1–1.0)
Monocytes Relative: 4 %
Neutro Abs: 14.3 10*3/uL — ABNORMAL HIGH (ref 1.7–7.7)
Neutrophils Relative %: 87 %
Platelets: 334 10*3/uL (ref 150–400)
RBC: 3.04 MIL/uL — ABNORMAL LOW (ref 4.22–5.81)
RDW: 16 % — ABNORMAL HIGH (ref 11.5–15.5)
WBC: 16.4 10*3/uL — ABNORMAL HIGH (ref 4.0–10.5)
nRBC: 0 % (ref 0.0–0.2)

## 2023-11-19 LAB — PROTIME-INR
INR: 2.8 — ABNORMAL HIGH (ref 0.8–1.2)
Prothrombin Time: 30 s — ABNORMAL HIGH (ref 11.4–15.2)

## 2023-11-19 LAB — BASIC METABOLIC PANEL
Anion gap: 13 (ref 5–15)
BUN: 122 mg/dL — ABNORMAL HIGH (ref 8–23)
CO2: 27 mmol/L (ref 22–32)
Calcium: 8 mg/dL — ABNORMAL LOW (ref 8.9–10.3)
Chloride: 96 mmol/L — ABNORMAL LOW (ref 98–111)
Creatinine, Ser: 2.39 mg/dL — ABNORMAL HIGH (ref 0.61–1.24)
GFR, Estimated: 26 mL/min — ABNORMAL LOW (ref >60)
Glucose, Bld: 290 mg/dL — ABNORMAL HIGH (ref 70–99)
Potassium: 4.5 mmol/L (ref 3.5–5.1)
Sodium: 136 mmol/L (ref 135–145)

## 2023-11-19 LAB — MAGNESIUM: Magnesium: 2.7 mg/dL — ABNORMAL HIGH (ref 1.7–2.4)

## 2023-11-19 MED ORDER — WARFARIN SODIUM 2.5 MG PO TABS
2.5000 mg | ORAL_TABLET | Freq: Once | ORAL | Status: AC
  Administered 2023-11-19: 2.5 mg via ORAL
  Filled 2023-11-19: qty 1

## 2023-11-19 NOTE — Plan of Care (Signed)
Problem: Education: Goal: Knowledge of General Education information will improve Description Including pain rating scale, medication(s)/side effects and non-pharmacologic comfort measures Outcome: Progressing   Problem: Health Behavior/Discharge Planning: Goal: Ability to manage health-related needs will improve Outcome: Progressing

## 2023-11-19 NOTE — Progress Notes (Signed)
   11/19/23 1305  ReDS Vest / Clip  Station Marker D  Ruler Value 33  ReDS Value Range  (lows quality)  ReDS Actual Value  (low quality)

## 2023-11-19 NOTE — Progress Notes (Signed)
PHARMACY - ANTICOAGULATION CONSULT NOTE  Pharmacy Consult for warfarin Indication: atrial fibrillation  Allergies  Allergen Reactions   Other Rash    Bandaids    Patient Measurements: Height: 5\' 7"  (170.2 cm) Weight: 77.7 kg (171 lb 4.8 oz) IBW/kg (Calculated) : 66.1  Vital Signs: Temp: 97.7 F (36.5 C) (01/28 0422) Temp Source: Oral (01/28 0422) BP: 140/35 (01/28 0422) Pulse Rate: 75 (01/28 0422)  Labs: Recent Labs    11/17/23 0416 11/18/23 0407 11/19/23 0509  HGB 8.1* 8.3* 8.5*  HCT 25.7* 25.6* 26.9*  PLT 290 318 334  LABPROT 31.2* 28.1* 30.0*  INR 3.0* 2.6* 2.8*  CREATININE 2.78* 2.58* 2.39*    Estimated Creatinine Clearance: 22.3 mL/min (A) (by C-G formula based on SCr of 2.39 mg/dL (H)).   Medical History: Past Medical History:  Diagnosis Date   (HFpEF) heart failure with preserved ejection fraction (HCC)    Arthritis    Atrial fibrillation (HCC)    Bradycardia    Carotid artery disease (HCC)    Hyperlipidemia    Hypertension    Pulmonary hypertension (HCC)    Type 2 diabetes mellitus (HCC)     Assessment: 83 year old male with known afib on chronic warfarin therapy. INR elevated initially at 7.2. Hemoglobin trending down 10.2>9.2>8.5, no bleeding issues noted.   Home dose listed as 7.5 mg on Mon + Fri and 5 mg ROW.  1/24 INR remains elevated as well as LFTs, even subtle worsening. Will administer PO vitamin K today in attempt to treat coagulopathy.  1/25 INR trending down s/p vit K yesterday  1/26 INR continues to trend down, now at upper end of goal. Patient's PO intake is advancing (75% of meals yesterday, up from 50%) and LFTs back WNL.  1/27 INR therapeutic. PO intake remains about the same.    New drug-drug interactions: Azithromycin - potential to enhance warfarin's effect  Goal of Therapy:  INR 2-3 Monitor platelets by anticoagulation protocol: Yes   Plan:  Administer warfarin 2.5 mg PO x 1 today (decrease of 2.5 mg from home  regimen) Daily INR  Judeth Cornfield, PharmD Clinical Pharmacist 11/19/2023 8:00 AM

## 2023-11-19 NOTE — Progress Notes (Signed)
Bipap is PRN order.  Not needed at this time.

## 2023-11-19 NOTE — Progress Notes (Signed)
Mobility Specialist Progress Note:    11/19/23 1315  Mobility  Activity Ambulated with assistance in room  Level of Assistance Contact guard assist, steadying assist  Assistive Device None  Distance Ambulated (ft) 40 ft  Range of Motion/Exercises Active;All extremities  Activity Response Tolerated well  Mobility Referral Yes  Mobility visit 1 Mobility  Mobility Specialist Start Time (ACUTE ONLY) 1315  Mobility Specialist Stop Time (ACUTE ONLY) 1330  Mobility Specialist Time Calculation (min) (ACUTE ONLY) 15 min   Pt received in bed, wife in room. Agreeable to mobility. Required CGA to stand and ambulate with no AD. Tolerated well, see O2 sats below. Returned pt supine, all needs met.  SpO2 91% 4L at rest and during ambulation. SpO2 88% 4L sitting EOB after session. Pt took deep breaths, recovered. SpO2 93% on 4L sitting EOB.  Capri Raben Mobility Specialist Please contact via Special educational needs teacher or  Rehab office at [phone]

## 2023-11-19 NOTE — Inpatient Diabetes Management (Signed)
Inpatient Diabetes Program Recommendations  AACE/ADA: New Consensus Statement on Inpatient Glycemic Control (2015)  Target Ranges:  Prepandial:   less than 140 mg/dL      Peak postprandial:   less than 180 mg/dL (1-2 hours)      Critically ill patients:  140 - 180 mg/dL   Lab Results  Component Value Date   GLUCAP 238 (H) 11/19/2023   HGBA1C 7.4 (H) 11/14/2023    Review of Glycemic Control  Latest Reference Range & Units 11/18/23 07:04 11/18/23 11:48 11/18/23 16:35 11/18/23 21:26 11/19/23 08:27  Glucose-Capillary 70 - 99 mg/dL 161 (H) 096 (H) 045 (H) 321 (H) 238 (H)  (H): Data is abnormally high Diabetes history: DM2 Outpatient Diabetes medications: Farxiga 10 mg daily Current orders for Inpatient glycemic control: Novolog 0-9 units correction scale TID, Novolog 0-5 units HS scale, Farxiga 10 mg every day, Novolog 3 units TID Solumedrol 40 mg BID  Inpatient Diabetes Program Recommendations:   Noted that blood sugars have been greater than 180 mg/dl.    Consider: -Adding Semglee 8 units every day - Increasing Novolog 5 units TID (assuming patient consuming >50% of meal and with steroids)  Thanks, Lujean Rave, MSN, RNC-OB Diabetes Coordinator [phone] (8a-5p)

## 2023-11-19 NOTE — TOC Initial Note (Signed)
Transition of Care [facility]) - Initial/Assessment Note    Patient Details  Name: Robert Baldwin MRN: [id number] Date of Birth: Mar 30, 1941  Transition of [facility]) CM/SW Contact:    Karn Cassis, LCSW Phone Number: 11/19/2023, 10:40 AM  Clinical Narrative: Pt admitted due to acute hypoxemic respiratory failure. Pt's wife reports pt is independent with ADLs at baseline. No home health prior to admission. PT evaluated pt and recommend HHPT. Discussed with wife who refuses. She is aware to notify TOC if they change their mind prior to d/c. Pt currently requiring O2. TOC will follow for possible home O2 needs.                   Expected Discharge Plan: Home/Self Care Barriers to Discharge: Continued Medical Work up   Patient Goals and CMS Choice Patient states their goals for this hospitalization and ongoing recovery are:: return home   Choice offered to / list presented to : Spouse [place] ownership interest in [facility]provided to::  (n/a)    Expected Discharge Plan and Services In-house Referral: Clinical Social Work     Living arrangements for the past 2 months: Single Family Home                           HH Arranged: Patient Refused HH          Prior Living Arrangements/Services Living arrangements for the past 2 months: Single Family Home Lives with:: Spouse Patient language and need for interpreter reviewed:: Yes Do you feel safe going back to the place where you live?: Yes      Need for Family Participation in Patient Care: No (Comment)     Criminal Activity/Legal Involvement Pertinent to Current Situation/Hospitalization: No - Comment as needed  Activities of Daily Living   ADL Screening (condition at time of admission) Independently performs ADLs?: Yes (appropriate for developmental age) Is the patient deaf or have difficulty hearing?: No Does the patient have difficulty seeing, even when wearing glasses/contacts?: No Does the  patient have difficulty concentrating, remembering, or making decisions?: No  Permission Sought/Granted                  Emotional Assessment         Alcohol / Substance Use: Not Applicable Psych Involvement: No (comment)  Admission diagnosis:  Acute respiratory failure with hypoxia (HCC) [J96.01] Acute hypoxemic respiratory failure (HCC) [J96.01] Multifocal pneumonia [J18.9] Patient Active Problem List   Diagnosis Date Noted   Acute hypoxemic respiratory failure (HCC) 11/13/2023   Carotid artery stenosis, asymptomatic, right 08/20/2022   Carotid stenosis 06/05/2022   Severe pulmonary hypertension (HCC) 04/30/2022   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 - [place], [place] - [address] [place] [address] Phone: [phone] Fax: [phone]     Social Drivers of Health (SDOH) Social History: SDOH Screenings   Food Insecurity: No Food Insecurity (11/13/2023)  Housing: Low Risk  (11/13/2023)  Transportation Needs: No Transportation Needs (11/13/2023)  Utilities: Not At Risk (11/13/2023)  Social Connections: Socially Integrated (11/13/2023)  Tobacco Use: Low Risk  (11/13/2023)  SDOH Interventions:     Readmission Risk Interventions     No data to display

## 2023-11-19 NOTE — Progress Notes (Signed)
PROGRESS NOTE    Robert Baldwin  WNU:[id number] DOB: 1941/03/03 DOA: 11/13/2023 PCP: Kirstie Peri, MD   Brief Narrative:    Robert Baldwin is a 83 y.o. male with medical history significant for type 2 diabetes, hypertension, atrial fibrillation on Coumadin, dyslipidemia, HFpEF, pulmonary hypertension, and CKD stage IIIb-IV, who presented to the ED with worsening cough and shortness of breath along with generalized weakness.  Patient was admitted with acute hypoxemic respiratory failure secondary to multifocal community-acquired pneumonia and was started on empiric antibiotics.  He has now completed course of antibiotics and is currently experiencing acute HFpEF with RV dysfunction and has been started on aggressive IV Lasix for diuresis.  Cardiology consulted for assistance in management.  Assessment & Plan:   Principal Problem:   Acute hypoxemic respiratory failure (HCC) Active Problems:   Mixed hyperlipidemia   Essential hypertension, benign   Atrial fibrillation with slow ventricular response (HCC)   Severe pulmonary hypertension (HCC)  Assessment and Plan:   Acute hypoxemic respiratory failure persisting in the setting of acute HFpEF with RV dysfunction -Continue on azithromycin and Rocephin and monitor CBC -Monitor cultures -Wean oxygen as tolerated -Flu COVID and RSV negative -Blood cultures negative -Continue scheduled breathing treatments and Mucinex -Continue IV Solu-Medrol twice daily 1/24 -Up in chair and incentive spirometry encouraged -Chest x-ray 1/24 with cardiomegaly and pulmonary edema now on diuresis with IV Lasix 80 mg 3 times daily -Appreciate cardiology assistance with 2D echocardiogram ordered  Multifocal CAP -Completed treatment with azithromycin and Rocephin for 5 days -Continue Solu-Medrol until oxygen has been weaned  Transaminitis with alkaline phosphatase elevation-resolved -Acute hepatitis panel negative -Ultrasound abdomen without any significant  findings -Lipitor will be resumed -Monitor a.m. labs  Worsening anemia-now stable -No overt bleeding noted, continue to follow trend with stabilization noted -INR improving to 3.0 today, continue to monitor -Noted to have iron deficiency, avoiding repletion during active infection -Plan to check stool occult   HFpEF with RV dysfunction -2D echocardiogram 04/2022 EF 70-75% -Continue tadalafil and Opsumit -Continue Demadex at increased dose, careful with diuresis -Farxiga -Repeat 2D echocardiogram as noted above by cardiology   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 07/2022 -Plan to resume Lipitor   Type 2 diabetes with hyperglycemia -Continue SGLT2i -Continue carb modified diet -Add SSI   Permanent atrial fibrillation -Bradycardic and not on AV nodal blockade agents -Currently on Coumadin with supratherapeutic INR -Hold Coumadin for now, appreciate pharmacy assistance -Vitamin K given 1/24   OSA on CPAP -Continue CPAP at night   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   Consultants:  Cardiology  Procedures:  None  Antimicrobials:  Anti-infectives (From admission, onward)    Start     Dose/Rate Route Frequency Ordered Stop   11/14/23 1100  cefTRIAXone (ROCEPHIN) 1 g in sodium chloride 0.9 % 100 mL IVPB  Status:  Discontinued        1 g 200 mL/hr over 30 Minutes Intravenous Every 24 hours 11/13/23 1549 11/19/23 0814   11/14/23 1000  azithromycin (ZITHROMAX) 500 mg in sodium chloride 0.9 % 250 mL IVPB  Status:  Discontinued        500 mg 250 mL/hr  over 60 Minutes Intravenous Every  24 hours 11/13/23 1549 11/19/23 0814   11/13/23 1115  cefTRIAXone (ROCEPHIN) 1 g in sodium chloride 0.9 % 100 mL IVPB        1 g 200 mL/hr over 30 Minutes Intravenous  Once 11/13/23 1111 11/13/23 1154   11/13/23 1115  azithromycin (ZITHROMAX) 500 mg in sodium chloride 0.9 % 250 mL IVPB        500 mg 250 mL/hr over 60 Minutes Intravenous  Once 11/13/23 1111 11/13/23 1231      Subjective: Patient seen and evaluated today with no new acute complaints or concerns. No acute concerns or events noted overnight.  He is starting to feel better, but continues to have elevated oxygen requirement and has lower extremity edema.  Objective: Vitals:   11/19/23 0300 11/19/23 0422 11/19/23 0953 11/19/23 1022  BP:  (!) 140/35  (!) 153/42  Pulse:  75  (!) 50  Resp:  20    Temp:  97.7 F (36.5 C)    TempSrc:  Oral    SpO2:  96% 96% 96%  Weight: 77.7 kg     Height:        Intake/Output Summary (Last 24 hours) at 11/19/2023 1407 Last data filed at 11/18/2023 2228 Gross per 24 hour  Intake --  Output 150 ml  Net -150 ml   Filed Weights   11/17/23 1100 11/18/23 0300 11/19/23 0300  Weight: 79.2 kg 78.7 kg 77.7 kg    Examination:  General exam: Appears calm and comfortable  Respiratory system: Bilateral mild crackles. Respiratory effort normal.  Currently on 4 L nasal cannula. Cardiovascular system: S1 & S2 heard, RRR.  Gastrointestinal system: Abdomen is soft Central nervous system: Alert and awake Extremities: No edema Skin: No significant lesions noted Psychiatry: Flat affect.    Data Reviewed: I have personally reviewed following labs and imaging studies  CBC: Recent Labs  Lab 11/13/23 1050 11/14/23 0523 11/15/23 0508 11/15/23 1810 11/16/23 0506 11/17/23 0416 11/18/23 0407 11/19/23 0509  WBC 38.3*   < > 25.4*  --  20.6* 23.8* 20.1* 16.4*  NEUTROABS 34.5*  --  22.4*  --   --   --   --  14.3*  HGB 10.2*   < > 8.8* 8.6* 8.2* 8.1* 8.3* 8.5*  HCT 30.9*   < > 27.5* 26.7* 26.2*  25.7* 25.6* 26.9*  MCV 88.5   < > 89.9  --  89.4 89.5 89.2 88.5  PLT 249   < > 260  --  269 290 318 334   < > = values in this interval not displayed.   Basic Metabolic Panel: Recent Labs  Lab 11/15/23 0508 11/16/23 0506 11/17/23 0416 11/18/23 0407 11/19/23 0509  NA 134* 136 134* 134* 136  K 3.9 4.6 4.5 4.3 4.5  CL 97* 100 97* 96* 96*  CO2 24 23 24 25 27   GLUCOSE 172* 273* 273* 280* 290*  BUN 87* 101* 117* 123* 122*  CREATININE 2.46* 2.61* 2.78* 2.58* 2.39*  CALCIUM 8.3* 8.1* 7.9* 8.0* 8.0*  MG 2.5* 2.7* 2.8* 2.8* 2.7*   GFR: Estimated Creatinine Clearance: 22.3 mL/min (A) (by C-G formula based on SCr of 2.39 mg/dL (H)). Liver Function Tests: Recent Labs  Lab 11/13/23 1050 11/14/23 0523 11/15/23 0508 11/16/23 0506 11/17/23 0416  AST 67* 53* 74* 51* 37  ALT 54* 47* 53* 47* 41  ALKPHOS 175* 163* 200* 185* 158*  BILITOT 1.4* 0.9 0.9 0.9 0.8  PROT 7.5 6.5 6.6 6.6  6.3*  ALBUMIN 2.9* 2.4* 2.4* 2.3* 2.3*   No results for input(s): "LIPASE", "AMYLASE" in the last 168 hours. No results for input(s): "AMMONIA" in the last 168 hours. Coagulation Profile: Recent Labs  Lab 11/15/23 0508 11/16/23 0506 11/17/23 0416 11/18/23 0407 11/19/23 0509  INR 6.7* 3.5* 3.0* 2.6* 2.8*   Cardiac Enzymes: No results for input(s): "CKTOTAL", "CKMB", "CKMBINDEX", "TROPONINI" in the last 168 hours. BNP (last 3 results) No results for input(s): "PROBNP" in the last 8760 hours. HbA1C: No results for input(s): "HGBA1C" in the last 72 hours.  CBG: Recent Labs  Lab 11/18/23 1148 11/18/23 1635 11/18/23 2126 11/19/23 0827 11/19/23 1154  GLUCAP 234* 348* 321* 238* 284*   Lipid Profile: No results for input(s): "CHOL", "HDL", "LDLCALC", "TRIG", "CHOLHDL", "LDLDIRECT" in the last 72 hours. Thyroid Function Tests: No results for input(s): "TSH", "T4TOTAL", "FREET4", "T3FREE", "THYROIDAB" in the last 72 hours. Anemia Panel: No results for input(s): "VITAMINB12", "FOLATE", "FERRITIN",  "TIBC", "IRON", "RETICCTPCT" in the last 72 hours.  Sepsis Labs: Recent Labs  Lab 11/13/23 1305 11/13/23 1449  LATICACIDVEN 1.2 1.2    Recent Results (from the past 240 hours)  Resp panel by RT-PCR (RSV, Flu A&B, Covid) Anterior Nasal Swab     Status: None   Collection Time: 11/13/23 10:25 AM   Specimen: Anterior Nasal Swab  Result Value Ref Range Status   SARS Coronavirus 2 by RT PCR NEGATIVE NEGATIVE Final    Comment: (NOTE) SARS-CoV-2 target nucleic acids are NOT DETECTED.  The SARS-CoV-2 RNA is generally detectable in upper respiratory specimens during the acute phase of infection. The lowest concentration of SARS-CoV-2 viral copies this assay can detect is 138 copies/mL. A negative result does not preclude SARS-Cov-2 infection and should not be used as the sole basis for treatment or other patient management decisions. A negative result may occur with  improper specimen collection/handling, submission of specimen other than nasopharyngeal swab, presence of viral mutation(s) within the areas targeted by this assay, and inadequate number of viral copies(<138 copies/mL). A negative result must be combined with clinical observations, patient history, and epidemiological information. The expected result is Negative.  Fact Sheet for Patients:  BloggerCourse.com  Fact Sheet for Healthcare Providers:  SeriousBroker.it  This test is no t yet approved or cleared by the [place] FDA and  has been authorized for detection and/or diagnosis of SARS-CoV-2 by FDA under an Emergency Use Authorization (EUA). This EUA will remain  in effect (meaning this test can be used) for the duration of the COVID-19 declaration under Section 564(b)(1) of the Act, 21 U.S.C.section 360bbb-3(b)(1), unless the authorization is terminated  or revoked sooner.       Influenza A by PCR NEGATIVE NEGATIVE Final   Influenza B by PCR NEGATIVE NEGATIVE  Final    Comment: (NOTE) The Xpert Xpress SARS-CoV-2/FLU/RSV plus assay is intended as an aid in the diagnosis of influenza from Nasopharyngeal swab specimens and should not be used as a sole basis for treatment. Nasal washings and aspirates are unacceptable for Xpert Xpress SARS-CoV-2/FLU/RSV testing.  Fact Sheet for Patients: BloggerCourse.com  Fact Sheet for Healthcare Providers: SeriousBroker.it  This test is not yet approved or cleared by the [place] FDA and has been authorized for detection and/or diagnosis of SARS-CoV-2 by FDA under an Emergency Use Authorization (EUA). This EUA will remain in effect (meaning this test can be used) for the duration of the COVID-19 declaration under Section 564(b)(1) of the Act, 21 U.S.C. section 360bbb-3(b)(1), unless the  authorization is terminated or revoked.     Resp Syncytial Virus by PCR NEGATIVE NEGATIVE Final    Comment: (NOTE) Fact Sheet for Patients: BloggerCourse.com  Fact Sheet for Healthcare Providers: SeriousBroker.it  This test is not yet approved or cleared by the [place] FDA and has been authorized for detection and/or diagnosis of SARS-CoV-2 by FDA under an Emergency Use Authorization (EUA). This EUA will remain in effect (meaning this test can be used) for the duration of the COVID-19 declaration under Section 564(b)(1) of the Act, 21 U.S.C. section 360bbb-3(b)(1), unless the authorization is terminated or revoked.  Performed at [facility], [address], [place], [place] [address]   Blood culture (routine x 2)     Status: None   Collection Time: 11/13/23  1:05 PM   Specimen: BLOOD  Result Value Ref Range Status   Specimen Description BLOOD BLOOD LEFT HAND  Final   Special Requests   Final    Blood Culture results may not be optimal due to an inadequate volume of blood received in culture bottles  BOTTLES DRAWN AEROBIC AND ANAEROBIC   Culture   Final    NO GROWTH 5 DAYS Performed at [facility], [address] [place], [place] [address]    Report Status 11/18/2023 FINAL  Final  Blood culture (routine x 2)     Status: None   Collection Time: 11/13/23  1:05 PM   Specimen: BLOOD  Result Value Ref Range Status   Specimen Description BLOOD BLOOD RIGHT HAND  Final   Special Requests   Final    Blood Culture results may not be optimal due to an inadequate volume of blood received in culture bottles BOTTLES DRAWN AEROBIC ONLY   Culture   Final    NO GROWTH 5 DAYS Performed at [facility], [address], [place], [place] [address]    Report Status 11/18/2023 FINAL  Final         Radiology Studies: No results found.       Scheduled Meds:  dapagliflozin propanediol  10 mg Oral Daily   dextromethorphan-guaiFENesin  1 tablet Oral BID   furosemide  80 mg Intravenous TID   hydrALAZINE  25 mg Oral BID   insulin aspart  0-5 Units Subcutaneous QHS   insulin aspart  0-9 Units Subcutaneous TID WC   insulin aspart  3 Units Subcutaneous TID WC   ipratropium-albuterol  3 mL Nebulization BID   macitentan  10 mg Oral Daily   methylPREDNISolone (SOLU-MEDROL) injection  40 mg Intravenous Q12H   polyethylene glycol  17 g Oral Daily   tadalafil  20 mg Oral BID   warfarin  2.5 mg Oral ONCE-1600   Warfarin - Pharmacist Dosing Inpatient   Does not apply q1600     LOS: 6 days    Time spent: 55 minutes    Sanyah Molnar Hoover Brunette, DO [facility]  If 7PM-7AM, please contact night-coverage [email or web address] 11/19/2023, 2:07 PM

## 2023-11-19 NOTE — Plan of Care (Signed)

## 2023-11-19 NOTE — Consult Note (Addendum)
Cardiology Consultation   Patient ID: Robert Baldwin MRN: [id number]; DOB: 19-Dec-1940  Admit date: 11/13/2023 Date of Consult: 11/19/2023  PCP:  Kirstie Peri, MD   [facility] Providers Cardiologist:  [name], MD  Sleep Medicine:  Armanda Magic, MD  AHF: Dr. Shirlee Latch  Patient Profile:   Robert Baldwin is a 83 y.o. 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 [name].  History of Present Illness:   Robert Baldwin was examined by Dr. Diona Browner in 08/2023 and his weight was overall stable at 175 lbs. He was being followed by [facility] as well and was continued on Torsemide 80 mg in AM/40 mg in PM while remaining on Farxiga 10 mg daily. He was no longer on Entresto and Spironolactone given worsening renal function. In regards to his PAH, he was continued on Opsumit and Tadalafil.   He presented to [facility] ED 11/13/2023 for evaluation of worsening cough, shortness of breath and generalized weakness for the past 2 weeks. Was hypoxic and required supplemental oxygen while in the ED. He was negative for COVID, influenza and RSV. BNP was elevated at 816 and CT Chest showed extensive and patchy consolidative opacities throughout both lungs consistent with multifocal pneumonia. Was also noted to have trace bilateral pleural effusions and cardiomegaly with a small pericardial effusion. INR was also significantly elevated at 6.7 on admission.  He was admitted for further management of acute hypoxic respiratory failure in the setting of community-acquired pneumonia. He had worsening hypoxia on 11/15/2023 and repeat CXR showed cardiomegaly with vascular congestion and a small right effusion and  also noted to have pneumonia. He had been receiving his home doses of Torsemide 80 mg in AM/40 mg in PM and also received a dose of metolazone 2.5 mg on 11/16/2023. Torsemide was eventually held and he was started on IV Lasix 80 mg 3 times daily yesterday and only received 1 dose due to hypotension. It appears no changes were made to his antihypertensive medications and he has been continued on Amlodipine 10 mg daily and Hydralazine 25 mg twice daily.  In talking with the patient and his wife today, she provides an extensive history in regards to his prior cardiac issues. Says that he was overall been doing well except for the week prior to admission during which he had a progressively worsening cough and dyspnea despite a steroid taper being prescribed by his PCP. Ultimately came to the hospital for additional evaluation. He does report intermittent shortness of breath but says this is starting to improve. No recent abdominal distention. Does note intermittent lower extremity edema but says his weight has been stable around 170 lbs on his home scales. He currently has the head of his bed elevated but denies any specific PND. No recent chest pain or palpitations.  Past Medical History:  Diagnosis Date   (HFpEF) heart failure with preserved ejection fraction (HCC)    Arthritis    Atrial fibrillation (HCC)    Bradycardia    Carotid artery disease (HCC)    Hyperlipidemia    Hypertension    Pulmonary hypertension (HCC)    Type 2 diabetes mellitus (HCC)  Past Surgical History:  Procedure Laterality Date   Cataract surgery     COLONOSCOPY     HERNIA REPAIR     RIGHT HEART CATH N/A 05/28/2022   Procedure: RIGHT HEART CATH;  Surgeon: Laurey Morale, MD;  Location: [facility] INVASIVE CV LAB;  Service: Cardiovascular;  Laterality: N/A;   RIGHT HEART CATH N/A 05/21/2023   Procedure: RIGHT HEART CATH;  Surgeon: Laurey Morale, MD;  Location: [facility] INVASIVE CV LAB;  Service: Cardiovascular;  Laterality: N/A;    RIGHT/LEFT HEART CATH AND CORONARY ANGIOGRAPHY N/A 04/30/2022   Procedure: RIGHT/LEFT HEART CATH AND CORONARY ANGIOGRAPHY;  Surgeon: Yvonne Kendall, MD;  Location: [facility] INVASIVE CV LAB;  Service: Cardiovascular;  Laterality: N/A;   TONSILLECTOMY     TRANSCAROTID ARTERY REVASCULARIZATION  Left 07/02/2022   Procedure: Transcarotid Artery Revascularization;  Surgeon: Cephus Shelling, MD;  Location: [facility] OR;  Service: Vascular;  Laterality: Left;   TRANSCAROTID ARTERY REVASCULARIZATION  Right 08/20/2022   Procedure: Right Transcarotid Artery Revascularization;  Surgeon: Cephus Shelling, MD;  Location: [facility] OR;  Service: Vascular;  Laterality: Right;   ULTRASOUND GUIDANCE FOR VASCULAR ACCESS Right 07/02/2022   Procedure: ULTRASOUND GUIDANCE FOR VASCULAR ACCESS;  Surgeon: Cephus Shelling, MD;  Location: [facility] OR;  Service: Vascular;  Laterality: Right;   ULTRASOUND GUIDANCE FOR VASCULAR ACCESS Left 08/20/2022   Procedure: ULTRASOUND GUIDANCE FOR VASCULAR ACCESS;  Surgeon: Cephus Shelling, MD;  Location: [facility] OR;  Service: Vascular;  Laterality: Left;   YAG LASER APPLICATION Right 08/17/2016   Procedure: YAG LASER APPLICATION;  Surgeon: Susa Simmonds, MD;  Location: [facility] ORS;  Service: Ophthalmology;  Laterality: Right;     Home Medications:  Prior to Admission medications   Medication Sig Start Date End Date Taking? Authorizing Provider  amLODipine (NORVASC) 10 MG tablet Take 1 tablet (10 mg total) by mouth daily. 05/14/23 05/13/24 Yes Laurey Morale, MD  atorvastatin (LIPITOR) 40 MG tablet Take 40 mg by mouth at bedtime.   Yes [provider]  cholecalciferol (VITAMIN D) 25 MCG (1000 UNIT) tablet Take 1,000 Units by mouth daily.   Yes [provider]  FARXIGA 10 MG TABS tablet TAKE 1 TABLET BY MOUTH DAILY 08/09/23  Yes Clegg, Amy D, NP  fish oil-omega-3 fatty acids 1000 MG capsule Take 1 g by mouth every evening.   Yes [provider]  hydrALAZINE  (APRESOLINE) 25 MG tablet Take 1 tablet (25 mg total) by mouth 3 (three) times daily. Patient taking differently: Take 25 mg by mouth 2 (two) times daily. 09/18/23  Yes Jonelle Sidle, MD  macitentan (OPSUMIT) 10 MG tablet TAKE 1 TABLET BY MOUTH DAILY 05/20/23  Yes Laurey Morale, MD  Misc Natural Products (GLUCOS-CHONDROIT-MSM COMPLEX) TABS Take 2 tablets by mouth daily.   Yes [provider]  Multiple Vitamins-Minerals (MULTIVITAMIN WITH MINERALS) tablet Take 1 tablet by mouth in the morning.  Centrum Silver   Yes [provider]  niacin 500 MG tablet Take 500 mg by mouth in the morning.   Yes [provider]  potassium chloride (KLOR-CON M) 10 MEQ tablet Take 10 mEq by mouth daily. 10/18/23  Yes [provider]  tadalafil (CIALIS) 20 MG tablet TAKE 2 TABLETS BY MOUTH DAILY Patient taking differently: Take 20 mg by mouth 2 (two) times daily. 05/13/23  Yes Laurey Morale, MD  torsemide (DEMADEX) 20 MG tablet Take 4 tablets (80 mg total) by mouth every morning AND 2 tablets (40 mg total)  every evening. 07/23/23  Yes Milford, Anderson Malta, FNP  warfarin (COUMADIN) 5 MG tablet Take 5-7.5 mg by mouth See admin instructions. Take 5 mg on Saturday,Sunday Tues, Wed, Thursday, and then  Take 7.5 mg on Monday and Friday   Yes [provider]  benzonatate (TESSALON) 200 MG capsule Take 200 mg by mouth 3 (three) times daily as needed. Patient not taking: Reported on 11/13/2023 11/05/23   [provider]  predniSONE (STERAPRED UNI-PAK 21 TAB) 5 MG (21) TBPK tablet Take by mouth as directed. Patient not taking: Reported on 11/13/2023 11/05/23   [provider]    Inpatient Medications: Scheduled Meds:  amLODipine  10 mg Oral Daily   dapagliflozin propanediol  10 mg Oral Daily   dextromethorphan-guaiFENesin  1 tablet Oral BID   furosemide  80 mg Intravenous TID   hydrALAZINE  25 mg Oral BID   insulin aspart  0-5 Units Subcutaneous QHS    insulin aspart  0-9 Units Subcutaneous TID WC   insulin aspart  3 Units Subcutaneous TID WC   ipratropium-albuterol  3 mL Nebulization BID   macitentan  10 mg Oral Daily   methylPREDNISolone (SOLU-MEDROL) injection  40 mg Intravenous Q12H   polyethylene glycol  17 g Oral Daily   tadalafil  20 mg Oral BID   warfarin  2.5 mg Oral ONCE-1600   Warfarin - Pharmacist Dosing Inpatient   Does not apply q1600   Continuous Infusions:  PRN Meds: acetaminophen **OR** acetaminophen, albuterol, ondansetron **OR** ondansetron (ZOFRAN) IV, mouth rinse  Allergies:    Allergies  Allergen Reactions   Other Rash    Bandaids    Social History:   Social History   Socioeconomic History   Marital status: Married    Spouse name: Tammy   Number of children: Not on file   Years of education: Not on file   Highest education level: Not on file  Occupational History   Occupation: Full time    Comment: Contractor  Tobacco Use   Smoking status: Never    Passive exposure: Never   Smokeless tobacco: Never   Tobacco comments:    Never smoke 05/18/22  Vaping Use   Vaping status: Never Used  Substance and Sexual Activity   Alcohol use: No    Alcohol/week: 0.0 standard drinks of alcohol   Drug use: No   Sexual activity: Not Currently  Other Topics Concern   Not on file  Social History Narrative   Not on file   Social Drivers of Health   Financial Resource Strain: Not on file  Food Insecurity: No Food Insecurity (11/13/2023)   Hunger Vital Sign    Worried About Running Out of Food in the Last Year: Never true    Ran Out of Food in the Last Year: Never true  Transportation Needs: No Transportation Needs (11/13/2023)   PRAPARE - Administrator, Civil Service (Medical): No    Lack of Transportation (Non-Medical): No  Physical Activity: Not on file  Stress: Not on file  Social Connections: Socially Integrated (11/13/2023)   Social Connection and Isolation Panel [NHANES]    Frequency  of Communication with Friends and Family: Three times a week    Frequency of Social Gatherings with Friends and Family: Once a week    Attends Religious Services: 1 to 4 times per year    Active Member of [employer] or Organizations: Yes    Attends Banker Meetings: 1 to 4 times per year  Marital Status: Married  Catering manager Violence: Not At Risk (11/13/2023)   Humiliation, Afraid, Rape, and Kick questionnaire    Fear of Current or Ex-Partner: No    Emotionally Abused: No    Physically Abused: No    Sexually Abused: No    Family History:    Family History  Problem Relation Age of Onset   Stroke Other    Heart disease Other      ROS:  Please see the history of present illness.   All other ROS reviewed and negative.     Physical Exam/Data:   Vitals:   11/18/23 2300 11/19/23 0300 11/19/23 0422 11/19/23 0953  BP: (!) 131/38  (!) 140/35   Pulse:   75   Resp:   20   Temp:   97.7 F (36.5 C)   TempSrc:   Oral   SpO2:   96% 96%  Weight:  77.7 kg    Height:        Intake/Output Summary (Last 24 hours) at 11/19/2023 1001 Last data filed at 11/18/2023 2228 Gross per 24 hour  Intake --  Output 450 ml  Net -450 ml      11/19/2023    3:00 AM 11/18/2023    3:00 AM 11/17/2023   11:00 AM  Last 3 Weights  Weight (lbs) 171 lb 4.8 oz 173 lb 8 oz 174 lb 9.7 oz  Weight (kg) 77.7 kg 78.7 kg 79.2 kg     Body mass index is 26.83 kg/m.  General:  Pleasant, elderly male male appearing in no acute distress HEENT: normal Neck: no JVD Vascular: No carotid bruits; Distal pulses 2+ bilaterally Cardiac:  normal S1, S2; irregular irregular, bradycardic rate. Lungs: Bilateral rales and occasional rhonchi.  No wheezing. Abd: soft, nontender, no hepatomegaly  Ext: Trace lower extremity edema Musculoskeletal:  No deformities, BUE and BLE strength normal and equal Skin: warm and dry  Neuro:  CNs 2-12 intact, no focal abnormalities noted Psych:  Normal affect   EKG:  The EKG  was personally reviewed and demonstrates: Atrial fibrillation with slow ventricular response, heart rate 41 with RBBB.  Telemetry:  Telemetry was personally reviewed and demonstrates: Atrial fibrillation with slow ventricular response, heart rate in 30's to 40's with occasional PVC's. Pauses up to 2.13 seconds.  Relevant CV Studies:  Echocardiogram: 04/2023 IMPRESSIONS     1. Left ventricular ejection fraction, by estimation, is 60 to 65%. The  left ventricle has normal function. The left ventricle has no regional  wall motion abnormalities. Left ventricular diastolic parameters are  indeterminate.   2. Mildly D-shaped interventricular septum suggestive of RV  pressure/volume overload. Right ventricular systolic function is mildly  reduced. The right ventricular size is moderately enlarged. There is  severely elevated pulmonary artery systolic  pressure. The estimated right ventricular systolic pressure is 78.2 mmHg.   3. Left atrial size was moderately dilated.   4. Right atrial size was moderately dilated.   5. The mitral valve is degenerative. No evidence of mitral valve  regurgitation. Mild mitral stenosis. The mean mitral valve gradient is 4.0  mmHg. Moderate mitral annular calcification.   6. The aortic valve is tricuspid. There is moderate calcification of the  aortic valve. Aortic valve regurgitation is not visualized. Mild aortic  valve stenosis. Aortic valve mean gradient measures 15.0 mmHg.   7. The inferior vena cava is dilated in size with >50% respiratory  variability, suggesting right atrial pressure of 8 mmHg.   8. The patient  was in atrial fibrillation.   RHC: 04/2023 1. Mildly elevated PCWP with prominent v-waves suggestive of mitral valve disease.  2. 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  3. Mildly elevated RA pressure.  4. Preserved cardiac output.    Creatinine is better today.  I will increase torsemide to 40 mg bid.   Continue close followup.   Laboratory Data:  High Sensitivity Troponin:   Recent Labs  Lab 11/13/23 1050 11/13/23 1305  TROPONINIHS 80* 88*     Chemistry Recent Labs  Lab 11/17/23 0416 11/18/23 0407 11/19/23 0509  NA 134* 134* 136  K 4.5 4.3 4.5  CL 97* 96* 96*  CO2 24 25 27   GLUCOSE 273* 280* 290*  BUN 117* 123* 122*  CREATININE 2.78* 2.58* 2.39*  CALCIUM 7.9* 8.0* 8.0*  MG 2.8* 2.8* 2.7*  GFRNONAA 22* 24* 26*  ANIONGAP 13 13 13     Recent Labs  Lab 11/15/23 0508 11/16/23 0506 11/17/23 0416  PROT 6.6 6.6 6.3*  ALBUMIN 2.4* 2.3* 2.3*  AST 74* 51* 37  ALT 53* 47* 41  ALKPHOS 200* 185* 158*  BILITOT 0.9 0.9 0.8   Lipids No results for input(s): "CHOL", "TRIG", "HDL", "LABVLDL", "LDLCALC", "CHOLHDL" in the last 168 hours.  Hematology Recent Labs  Lab 11/17/23 0416 11/18/23 0407 11/19/23 0509  WBC 23.8* 20.1* 16.4*  RBC 2.87* 2.87* 3.04*  HGB 8.1* 8.3* 8.5*  HCT 25.7* 25.6* 26.9*  MCV 89.5 89.2 88.5  MCH 28.2 28.9 28.0  MCHC 31.5 32.4 31.6  RDW 15.9* 16.1* 16.0*  PLT 290 318 334   Thyroid No results for input(s): "TSH", "FREET4" in the last 168 hours.  BNP Recent Labs  Lab 11/13/23 1050 11/15/23 1810  BNP 816.0* 586.0*    DDimer No results for input(s): "DDIMER" in the last 168 hours.   Radiology/Studies:  DG CHEST PORT 1 VIEW Result Date: 11/15/2023 CLINICAL DATA:  Dyspnea EXAM: PORTABLE CHEST 1 VIEW COMPARISON:  11/13/2023 FINDINGS: Cardiomegaly with vascular congestion and small right effusion. Multifocal airspace opacities suggestive of pneumonia. No pneumothorax IMPRESSION: Cardiomegaly with vascular congestion and small right effusion. Multifocal airspace opacities suggestive of pneumonia. Electronically Signed   By: Jasmine Pang M.D.   On: 11/15/2023 19:55     Assessment and Plan:   1. Acute HFpEF/RV Dysfunction - Most recent echocardiogram in 04/2023 showed a preserved EF of 60 to 65% but RV function was mildly reduced. RHC in 04/2023  showed severe mixed pulmonary arterial/pulmonary venous hypertension and mildly elevated RA pressure. - He is currently being treated for an acute CHF exacerbation in the setting of CAP and BNP was 816 on admission and improved to 586 when rechecked on 11/15/2023. He was receiving his PTA Torsemide and this has been transitioned to IV Lasix 80 mg 3 times daily. Will review with Dr. Wyline Mood but would not want to over diurese in the setting of RV failure. Will likely need to reduce dosing to 80 mg twice daily. As discussed above, diuresis has been limited in the setting of hypotension but he has been receiving Amlodipine 10 mg daily and Hydralazine 25 mg twice daily. Will hold Amlodipine for now. He does remain on Farxiga 10 mg daily.  2.  Pulmonary hypertension - Followed by the [facility] and felt to be mixed group 1/2. He has been continued on Opsumit and Tadalafil.   3. Permanent Atrial Fibrillation - He has a longstanding history of atrial fibrillation with slow ventricular response. He  has not been on AV nodal blocking agents given his baseline heart rate in the 30's to 40's. - He is on Coumadin for anticoagulation and dosing was initially held given his supratherapeutic INR but had improved to 2.8 when checked today. Dosing being managed by Pharmacy.  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 Peaked at 2.78 this admission and improved to 2.39 today. Follow with diuresis.  5. CAP - Being treated with antibiotics and IV steroids. WBC peaked at 38.3 on admission and improved to 16.4 today. Management per the admitting team.   For questions or updates, please contact [facility] Please consult [email or web address] for contact info under    Signed, Ellsworth Lennox, PA-C  11/19/2023 10:01 AM  Attending Note   Patient seen and discussed with PA Iran Ouch, I agree with her documentation above. 83 yo male history of carotid stenosis, DM2, HTN, HLD, permanent afib,  chronic HFpEF, pulmonary HTN, RV dysfunction, CKD IV, admitted 11/13/23 with SOB. Gradual improvement in his pneumonia symptoms, has had ongoing issues with increased LE edema and volume overload, cardiology consulted to help manage   Admit labs K 4.2 Cr 2.52 BUN 78 AST 67 ALT 54 Alk phos 175 T bili 1.4 BNP 816 WBC 38.3 Hgb 10.2 Mg 2.7 Lactic acid 1.2 Trop 80--->88 EKG: afib, RBBB  PCXR: diffuse interstitial opacity CT chest: extensive patchy consolidative opacities bilateral, consistent with multifocal pneumonia    04/2023 echo: LVEF 60-65%, no MAs, indet diastolic dysfunction. D shaped septum, mild RV dysfunction, severe pulm HTN PASP 78, mod BAE, mild MS mean grad 4, mild AS mean grad 16  04/2023 RHC: mean PA 44, PCWP 17, CI 4.1, PVR 3.5 woods units    1.Acute on chronic HFpEF/Pulmonary HTN/RV dysfunction - 04/2023 echo: LVEF 60-65%, no MAs, indet diastolic dysfunction. D shaped septum, mild RV dysfunction, severe pulm HTN PASP 78 - 04/2023 RHC: mean PA 44, PCWP 17, CI 4.1, PVR 3.5 woods units - followed in HF clinic for combined pre and postcapillary pulmonary HTN. Extensive workup without clear precapillary etiology, suspicion of possible group I, he has been on macitentan and tadalafil. Postcapillary hemodynamics are related to his HFpEF.   - admitted SOB primarily related to multifocal pneumonia, has had some signs of fluid overload as well.  BNP 816-->586, CT primarily pneumonia - had been on oral torsemide during admit, changed to IV lasix. Only received single IV dose of 80mg  yesterday, PM dose held due to soft bp's (assuming based on DBPs as SBPs were normal, I suspect bp cuff issue). Scheduled for tid dosing today. Follow diuresis today on tid dosing. Mild variation in Cr without clear trend. Normal SBP but low DBP at times I suspect due to cuff error, check  manual. Hold home bp's for now as we diurese.  - repeat echo    2.Afib with chronic slow ventricular response - chronic  slow rates from chart review, at times 30s to 40s. In absence of symptoms has been just being monitored.  - off av nodal agents.  - he is on coumadin   3.Multifocal pneumonia - CT chest: extensive patchy consolidative opacities bilateral, consistent with multifocal pneumonia - WBC in the 30s on admission, resolving.   4.Anemia - per primary team, Hgb 8.5 this AM   Dina Rich MD

## 2023-11-19 NOTE — Progress Notes (Signed)
Echocardiogram 2D Echocardiogram has been performed.  Warren Lacy Aracelly Tencza RDCS 11/19/2023, 2:19 PM

## 2023-11-19 NOTE — Progress Notes (Signed)
   11/18/23 2300  Vitals  BP (!) 131/38  MAP (mmHg) 66  MEWS COLOR  MEWS Score Color Green  MEWS Score  MEWS Temp 0  MEWS Systolic 0  MEWS Pulse 1  MEWS RR 0  MEWS LOC 0  MEWS Score 1   Dr. Thomes Dinning made aware, lasix, hydralazine, and cialis held, no new orders obtained.

## 2023-11-20 DIAGNOSIS — I10 Essential (primary) hypertension: Secondary | ICD-10-CM

## 2023-11-20 DIAGNOSIS — I272 Pulmonary hypertension, unspecified: Secondary | ICD-10-CM

## 2023-11-20 DIAGNOSIS — E782 Mixed hyperlipidemia: Secondary | ICD-10-CM

## 2023-11-20 DIAGNOSIS — J9601 Acute respiratory failure with hypoxia: Secondary | ICD-10-CM | POA: Diagnosis not present

## 2023-11-20 DIAGNOSIS — I4891 Unspecified atrial fibrillation: Secondary | ICD-10-CM

## 2023-11-20 DIAGNOSIS — I5033 Acute on chronic diastolic (congestive) heart failure: Secondary | ICD-10-CM | POA: Diagnosis not present

## 2023-11-20 LAB — BASIC METABOLIC PANEL
Anion gap: 10 (ref 5–15)
BUN: 121 mg/dL — ABNORMAL HIGH (ref 8–23)
CO2: 29 mmol/L (ref 22–32)
Calcium: 8 mg/dL — ABNORMAL LOW (ref 8.9–10.3)
Chloride: 97 mmol/L — ABNORMAL LOW (ref 98–111)
Creatinine, Ser: 2.19 mg/dL — ABNORMAL HIGH (ref 0.61–1.24)
GFR, Estimated: 29 mL/min — ABNORMAL LOW (ref >60)
Glucose, Bld: 280 mg/dL — ABNORMAL HIGH (ref 70–99)
Potassium: 4.2 mmol/L (ref 3.5–5.1)
Sodium: 136 mmol/L (ref 135–145)

## 2023-11-20 LAB — GLUCOSE, CAPILLARY
Glucose-Capillary: 290 mg/dL — ABNORMAL HIGH (ref 70–99)
Glucose-Capillary: 294 mg/dL — ABNORMAL HIGH (ref 70–99)
Glucose-Capillary: 283 mg/dL — ABNORMAL HIGH (ref 70–99)
Glucose-Capillary: 165 mg/dL — ABNORMAL HIGH (ref 70–99)

## 2023-11-20 LAB — MAGNESIUM: Magnesium: 2.7 mg/dL — ABNORMAL HIGH (ref 1.7–2.4)

## 2023-11-20 LAB — CBC
HCT: 26.7 % — ABNORMAL LOW (ref 39.0–52.0)
Hemoglobin: 8.6 g/dL — ABNORMAL LOW (ref 13.0–17.0)
MCH: 28.5 pg (ref 26.0–34.0)
MCHC: 32.2 g/dL (ref 30.0–36.0)
MCV: 88.4 fL (ref 80.0–100.0)
Platelets: 327 10*3/uL (ref 150–400)
RBC: 3.02 MIL/uL — ABNORMAL LOW (ref 4.22–5.81)
RDW: 16.3 % — ABNORMAL HIGH (ref 11.5–15.5)
WBC: 16 10*3/uL — ABNORMAL HIGH (ref 4.0–10.5)
nRBC: 0 % (ref 0.0–0.2)

## 2023-11-20 LAB — PROTIME-INR
INR: 3.2 — ABNORMAL HIGH (ref 0.8–1.2)
Prothrombin Time: 32.7 s — ABNORMAL HIGH (ref 11.4–15.2)

## 2023-11-20 MED ORDER — METHYLPREDNISOLONE SODIUM SUCC 40 MG IJ SOLR
40.0000 mg | Freq: Two times a day (BID) | INTRAMUSCULAR | Status: AC
Start: 2023-11-20 — End: 2023-11-20
  Administered 2023-11-20: 40 mg via INTRAVENOUS
  Filled 2023-11-20: qty 1

## 2023-11-20 MED ORDER — PREDNISONE 20 MG PO TABS
40.0000 mg | ORAL_TABLET | Freq: Every day | ORAL | Status: DC
  Administered 2023-11-21: 40 mg via ORAL
  Filled 2023-11-20: qty 2

## 2023-11-20 MED ORDER — ATORVASTATIN CALCIUM 40 MG PO TABS
40.0000 mg | ORAL_TABLET | Freq: Every day | ORAL | Status: DC
  Administered 2023-11-20 – 2023-11-28 (×9): 40 mg via ORAL
  Filled 2023-11-20 (×9): qty 1

## 2023-11-20 MED ORDER — INSULIN GLARGINE-YFGN 100 UNIT/ML ~~LOC~~ SOLN
14.0000 [IU] | Freq: Every day | SUBCUTANEOUS | Status: DC
  Administered 2023-11-20 – 2023-11-23 (×4): 14 [IU] via SUBCUTANEOUS
  Filled 2023-11-20 (×6): qty 0.14

## 2023-11-20 MED ORDER — INSULIN ASPART 100 UNIT/ML IJ SOLN
6.0000 [IU] | Freq: Three times a day (TID) | INTRAMUSCULAR | Status: DC
  Administered 2023-11-20 – 2023-11-23 (×9): 6 [IU] via SUBCUTANEOUS

## 2023-11-20 NOTE — Plan of Care (Signed)
Problem: Education: Goal: Knowledge of General Education information will improve Description: Including pain rating scale, medication(s)/side effects and non-pharmacologic comfort measures 11/20/2023 1317 by Melvern Sample, RN Outcome: Progressing 11/20/2023 1317 by Melvern Sample, RN Outcome: Progressing   Problem: Health Behavior/Discharge Planning: Goal: Ability to manage health-related needs will improve 11/20/2023 1317 by Melvern Sample, RN Outcome: Progressing 11/20/2023 1317 by Melvern Sample, RN Outcome: Progressing   Problem: Clinical Measurements: Goal: Ability to maintain clinical measurements within normal limits will improve 11/20/2023 1317 by Melvern Sample, RN Outcome: Progressing 11/20/2023 1317 by Melvern Sample, RN Outcome: Progressing Goal: Will remain free from infection 11/20/2023 1317 by Melvern Sample, RN Outcome: Progressing 11/20/2023 1317 by Melvern Sample, RN Outcome: Progressing Goal: Diagnostic test results will improve 11/20/2023 1317 by Melvern Sample, RN Outcome: Progressing 11/20/2023 1317 by Melvern Sample, RN Outcome: Progressing Goal: Respiratory complications will improve 11/20/2023 1317 by Melvern Sample, RN Outcome: Progressing 11/20/2023 1317 by Melvern Sample, RN Outcome: Progressing Goal: Cardiovascular complication will be avoided 11/20/2023 1317 by Melvern Sample, RN Outcome: Progressing 11/20/2023 1317 by Melvern Sample, RN Outcome: Progressing   Problem: Activity: Goal: Risk for activity intolerance will decrease 11/20/2023 1317 by Melvern Sample, RN Outcome: Progressing 11/20/2023 1317 by Melvern Sample, RN Outcome: Progressing   Problem: Nutrition: Goal: Adequate nutrition will be maintained 11/20/2023 1317 by Melvern Sample, RN Outcome: Progressing 11/20/2023 1317 by Melvern Sample, RN Outcome:  Progressing   Problem: Coping: Goal: Level of anxiety will decrease 11/20/2023 1317 by Melvern Sample, RN Outcome: Progressing 11/20/2023 1317 by Melvern Sample, RN Outcome: Progressing   Problem: Elimination: Goal: Will not experience complications related to bowel motility 11/20/2023 1317 by Melvern Sample, RN Outcome: Progressing 11/20/2023 1317 by Melvern Sample, RN Outcome: Progressing Goal: Will not experience complications related to urinary retention 11/20/2023 1317 by Melvern Sample, RN Outcome: Progressing 11/20/2023 1317 by Melvern Sample, RN Outcome: Progressing   Problem: Pain Managment: Goal: General experience of comfort will improve and/or be controlled 11/20/2023 1317 by Melvern Sample, RN Outcome: Progressing 11/20/2023 1317 by Melvern Sample, RN Outcome: Progressing   Problem: Safety: Goal: Ability to remain free from injury will improve 11/20/2023 1317 by Melvern Sample, RN Outcome: Progressing 11/20/2023 1317 by Melvern Sample, RN Outcome: Progressing   Problem: Skin Integrity: Goal: Risk for impaired skin integrity will decrease 11/20/2023 1317 by Melvern Sample, RN Outcome: Progressing 11/20/2023 1317 by Melvern Sample, RN Outcome: Progressing   Problem: Education: Goal: Ability to describe self-care measures that may prevent or decrease complications (Diabetes Survival Skills Education) will improve 11/20/2023 1317 by Melvern Sample, RN Outcome: Progressing 11/20/2023 1317 by Melvern Sample, RN Outcome: Progressing Goal: Individualized Educational Video(s) 11/20/2023 1317 by Melvern Sample, RN Outcome: Progressing 11/20/2023 1317 by Melvern Sample, RN Outcome: Progressing   Problem: Coping: Goal: Ability to adjust to condition or change in health will improve 11/20/2023 1317 by Melvern Sample, RN Outcome: Progressing 11/20/2023 1317 by  Melvern Sample, RN Outcome: Progressing   Problem: Fluid Volume: Goal: Ability to maintain a balanced intake and output will improve 11/20/2023 1317 by Melvern Sample, RN Outcome: Progressing 11/20/2023 1317 by Melvern Sample, RN Outcome: Progressing   Problem: Health Behavior/Discharge Planning: Goal: Ability to identify and utilize available resources and services will improve 11/20/2023 1317 by Melvern Sample, RN Outcome: Progressing 11/20/2023 1317 by  Melvern Sample, RN Outcome: Progressing Goal: Ability to manage health-related needs will improve 11/20/2023 1317 by Melvern Sample, RN Outcome: Progressing 11/20/2023 1317 by Melvern Sample, RN Outcome: Progressing   Problem: Metabolic: Goal: Ability to maintain appropriate glucose levels will improve 11/20/2023 1317 by Melvern Sample, RN Outcome: Progressing 11/20/2023 1317 by Melvern Sample, RN Outcome: Progressing   Problem: Nutritional: Goal: Maintenance of adequate nutrition will improve 11/20/2023 1317 by Melvern Sample, RN Outcome: Progressing 11/20/2023 1317 by Melvern Sample, RN Outcome: Progressing Goal: Progress toward achieving an optimal weight will improve 11/20/2023 1317 by Melvern Sample, RN Outcome: Progressing 11/20/2023 1317 by Melvern Sample, RN Outcome: Progressing   Problem: Skin Integrity: Goal: Risk for impaired skin integrity will decrease 11/20/2023 1317 by Melvern Sample, RN Outcome: Progressing 11/20/2023 1317 by Melvern Sample, RN Outcome: Progressing   Problem: Tissue Perfusion: Goal: Adequacy of tissue perfusion will improve 11/20/2023 1317 by Melvern Sample, RN Outcome: Progressing 11/20/2023 1317 by Melvern Sample, RN Outcome: Progressing

## 2023-11-20 NOTE — Progress Notes (Addendum)
Patient has slept well this shift.  Patient vitals have been stable but pulse bradycardic in the 30s.   Patient is alert and oreinted and responds appropriately when awakened. No c/o chest pain. Standing weight this am is 76 kg.

## 2023-11-20 NOTE — Progress Notes (Signed)
Mobility Specialist Progress Note:    11/20/23 1225  Mobility  Activity Transferred from bed to chair;Stood at bedside  Level of Assistance Standby assist, set-up cues, supervision of patient - no hands on  Assistive Device None  Distance Ambulated (ft) 5 ft  Range of Motion/Exercises Active;All extremities  Activity Response Tolerated well  Mobility Referral Yes  Mobility visit 1 Mobility  Mobility Specialist Start Time (ACUTE ONLY) 1210  Mobility Specialist Stop Time (ACUTE ONLY) 1225  Mobility Specialist Time Calculation (min) (ACUTE ONLY) 15 min   Pt received in bed, wife in room. Agreeable to mobility, required supervision to stand and transfer with no AD. Tolerated well, see O2 sats below. Left pt in chair, alarm on. Call bell in reach, all needs met.   SpO2 90% on 1L at rest and throughout transfer.  SpO2 87% on 1L after ambulation.  Deep breathing, recovered. SpO2 90% on 1L after session.  Lawerance Bach Mobility Specialist Please contact via Special educational needs teacher or  Rehab office at [phone]

## 2023-11-20 NOTE — Progress Notes (Signed)
PROGRESS NOTE   Robert Baldwin  ZOX:[id number] DOB: [birth year] DOA: 11/13/2023 PCP: Kirstie Peri, MD   Chief Complaint  Patient presents with   hypoxia   Level of care: Telemetry  Brief Admission History:  83 y.o. male with medical history significant for type 2 diabetes, hypertension, atrial fibrillation on Coumadin, dyslipidemia, HFpEF, pulmonary hypertension, and CKD stage IIIb-IV, who presented to the ED with worsening cough and shortness of breath along with generalized weakness.  Patient was admitted with acute hypoxemic respiratory failure secondary to multifocal community-acquired pneumonia and was started on empiric antibiotics.  He has now completed course of antibiotics and is currently experiencing acute HFpEF with RV dysfunction and has been started on aggressive IV Lasix for diuresis.  Cardiology consulted for assistance in management.     Assessment and Plan:  Acute hypoxemic respiratory failure persisting in the setting of acute HFpEF with RV dysfunction -completed azithromycin and Rocephin  -Monitor cultures: no growth to date  -Wean oxygen as tolerated -Flu COVID and RSV negative -Blood cultures negative -Continue scheduled breathing treatments and Mucinex -Continue IV Solu-Medrol with plan to de-escalate over next day to oral prednisone -Up in chair and incentive spirometry encouraged -Chest x-ray 1/24 with cardiomegaly and pulmonary edema now on diuresis with IV Lasix 80 mg 3 times daily -Appreciate cardiology assistance with 2D echocardiogram ordered   [facility] Weights   11/18/23 0300 11/19/23 0300 11/20/23 0501  Weight: 78.7 kg 77.7 kg 76.6 kg    Intake/Output Summary (Last 24 hours) at 11/20/2023 1410 Last data filed at 11/20/2023 1235 Gross per 24 hour  Intake 780 ml  Output 2875 ml  Net -2095 ml   Multifocal CAP -Completed treatment with azithromycin and Rocephin for 5 days -weaning steroids   Transaminitis with alkaline phosphatase  elevation-resolved -Acute hepatitis panel negative -Ultrasound abdomen without any significant findings -Lipitor will be resumed   Normocytic Anemia -No overt bleeding noted, continue to follow trend with stabilization noted -INR improving to 3.2, continue to monitor -stool occult pending   HFpEF with RV dysfunction -2D echocardiogram 04/2022 EF 70-75% -Continue tadalafil and Opsumit -Farxiga -Repeat 2D echocardiogram as noted above by cardiology -remains volume overloaded but has been diuresing well on IV furosemide  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 07/2022 -resumed atorvastatin   Type 2 diabetes with hyperglycemia Uncontrolled with neurological complications -Continue SGLT2i -Continue carb modified diet -Add SSI -added semglee and prandial novolog coverage -steroid induced hyperglycemia - weaning steroid CBG (last 3)  Recent Labs    11/19/23 2025 11/20/23 0738 11/20/23 1107  GLUCAP 280* 290* 294*      Permanent atrial fibrillation -Bradycardic and not on AV nodal blockade agents -Currently on Coumadin with supratherapeutic INR -Hold Coumadin for now, appreciate pharmacy assistance -Vitamin K given 1/24   OSA on CPAP -Continue CPAP at night   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   Objective: Vitals:   11/20/23 0900 11/20/23 0921 11/20/23 0927 11/20/23 1234  BP:  (!) 142/42  Marland Kitchen)  153/38  Pulse:  (!) 41  (!)  45  Resp:      Temp:    98.4 F (36.9 C)  TempSrc:      SpO2: 95% 98% 96% 93%  Weight:      Height:        Intake/Output Summary (Last 24 hours) at 11/20/2023 1402 Last data filed at 11/20/2023 1235 Gross per 24 hour  Intake 780 ml  Output 2875 ml  Net -2095 ml   Filed Weights   11/18/23 0300 11/19/23 0300 11/20/23 0501  Weight: 78.7 kg 77.7 kg 76.6 kg   Examination:  General exam: Appears calm and comfortable  Respiratory system: Clear to auscultation. Respiratory effort normal. Cardiovascular system: normal S1 & S2 heard. No JVD, murmurs, rubs, gallops or clicks. No pedal edema. Gastrointestinal system: Abdomen is nondistended, soft and nontender. No organomegaly or masses felt. Normal bowel sounds heard. Central nervous system: Alert and oriented. No focal neurological deficits. Extremities: 1+ edema BLEs pitting Symmetric 5 x 5 power. Skin: No rashes, lesions or ulcers. Psychiatry: Judgement and insight appear normal. Mood & affect appropriate.   Data Reviewed: I have personally reviewed following labs and imaging studies  CBC: Recent Labs  Lab 11/15/23 0508 11/15/23 1810 11/16/23 0506 11/17/23 0416 11/18/23 0407 11/19/23 0509 11/20/23 0436  WBC 25.4*  --  20.6* 23.8* 20.1* 16.4* 16.0*  NEUTROABS 22.4*  --   --   --   --  14.3*  --   HGB 8.8*   < > 8.2* 8.1* 8.3* 8.5* 8.6*  HCT 27.5*   < > 26.2* 25.7* 25.6* 26.9* 26.7*  MCV 89.9  --  89.4 89.5 89.2 88.5 88.4  PLT 260  --  269 290 318 334 327   < > = values in this interval not displayed.    Basic Metabolic Panel: Recent Labs  Lab 11/16/23 0506 11/17/23 0416 11/18/23 0407 11/19/23 0509 11/20/23 0436  NA 136 134* 134* 136 136  K 4.6 4.5 4.3 4.5 4.2  CL 100 97* 96* 96* 97*  CO2 23 24 25 27 29   GLUCOSE 273* 273* 280* 290* 280*  BUN 101* 117* 123* 122* 121*  CREATININE 2.61* 2.78* 2.58* 2.39* 2.19*  CALCIUM 8.1* 7.9* 8.0* 8.0* 8.0*  MG 2.7* 2.8* 2.8* 2.7* 2.7*    CBG: Recent Labs  Lab  11/19/23 1154 11/19/23 1630 11/19/23 2025 11/20/23 0738 11/20/23 1107  GLUCAP 284* 245* 280* 290* 294*    Recent Results (from the past 240 hours)  Resp panel by RT-PCR (RSV, Flu A&B, Covid) Anterior Nasal Swab     Status: None   Collection Time: 11/13/23 10:25 AM   Specimen: Anterior Nasal Swab  Result Value Ref Range Status   SARS Coronavirus 2 by RT PCR NEGATIVE NEGATIVE Final    Comment: (NOTE) SARS-CoV-2 target nucleic acids are NOT DETECTED.  The SARS-CoV-2 RNA is generally detectable in upper respiratory specimens during the acute phase of infection. The lowest concentration of SARS-CoV-2 viral copies this assay can detect is 138 copies/mL. A negative result does not preclude SARS-Cov-2 infection and should not be used as the sole basis for treatment or other patient management decisions. A negative result may occur with  improper specimen collection/handling, submission of specimen other than nasopharyngeal swab, presence of viral mutation(s) within the areas targeted by this assay, and inadequate number of viral copies(<138 copies/mL). A negative result must be combined with clinical observations, patient history, and epidemiological information. The expected result is Negative.  Fact Sheet for Patients:  BloggerCourse.com  Fact Sheet for Healthcare Providers:  SeriousBroker.it  This test is no t yet approved or cleared by the [place] FDA and  has been authorized for detection and/or diagnosis of SARS-CoV-2 by FDA under an Emergency Use Authorization (EUA). This EUA will remain  in effect (meaning this test can be used) for the duration of the COVID-19 declaration under Section 564(b)(1) of the Act, 21 U.S.C.section 360bbb-3(b)(1), unless the authorization is terminated  or revoked sooner.       Influenza A by PCR NEGATIVE NEGATIVE Final   Influenza B by PCR NEGATIVE NEGATIVE Final    Comment: (NOTE) The  Xpert Xpress SARS-CoV-2/FLU/RSV plus assay is intended as an aid in the diagnosis of influenza from Nasopharyngeal swab specimens and should not be used as a sole basis for treatment. Nasal washings and aspirates are unacceptable for Xpert Xpress SARS-CoV-2/FLU/RSV testing.  Fact Sheet for Patients: BloggerCourse.com  Fact Sheet for Healthcare Providers: SeriousBroker.it  This test is not yet approved or cleared by the [place] FDA and has been authorized for detection and/or diagnosis of SARS-CoV-2 by FDA under an Emergency Use Authorization (EUA). This EUA will remain in effect (meaning this test can be used) for the duration of the COVID-19 declaration under Section 564(b)(1) of the Act, 21 U.S.C. section 360bbb-3(b)(1), unless the authorization is terminated or revoked.     Resp Syncytial Virus by PCR NEGATIVE NEGATIVE Final    Comment: (NOTE) Fact Sheet for Patients: BloggerCourse.com  Fact Sheet for Healthcare Providers: SeriousBroker.it  This test is not yet approved or cleared by the [place] FDA and has been authorized for detection and/or diagnosis of SARS-CoV-2 by FDA under an Emergency Use Authorization (EUA). This EUA will remain in effect (meaning this test can be used) for the duration of the COVID-19 declaration under Section 564(b)(1) of the Act, 21 U.S.C. section 360bbb-3(b)(1), unless the authorization is terminated or revoked.  Performed at [facility], [address], [place], [place] [address]   Blood culture (routine x 2)     Status: None   Collection Time: 11/13/23  1:05 PM   Specimen: BLOOD  Result Value Ref Range Status   Specimen Description BLOOD BLOOD LEFT HAND  Final   Special Requests   Final    Blood Culture results may not be optimal due to an inadequate volume of blood received in culture bottles BOTTLES DRAWN AEROBIC AND ANAEROBIC    Culture   Final    NO GROWTH 5 DAYS Performed at [facility], [address], [place], [place] [address]    Report Status 11/18/2023 FINAL  Final  Blood culture (routine x 2)     Status: None   Collection Time: 11/13/23  1:05 PM   Specimen: BLOOD  Result Value Ref Range Status   Specimen Description BLOOD BLOOD RIGHT HAND  Final   Special Requests   Final    Blood Culture results may not be optimal due to an inadequate volume of blood received in culture bottles BOTTLES DRAWN AEROBIC ONLY   Culture   Final    NO GROWTH 5 DAYS Performed at [facility], [address], [place], [place] [address]    Report Status 11/18/2023 FINAL  Final     Radiology Studies: ECHOCARDIOGRAM COMPLETE Result Date: 11/19/2023    ECHOCARDIOGRAM REPORT   Patient Name:   DEANTA MINCEY Date of Exam: 11/19/2023 Medical Rec #:  [id number]   Height:  67.0 in Accession #:    [id number]  Weight:       171.3 lb Date of Birth:  Nov 07, 1940   BSA:          1.893 m Patient Age:    82 years    BP:           153/42 mmHg Patient Gender: M           HR:           41 bpm. [facility] Procedure: 2D Echo, Color Doppler and Cardiac Doppler Indications:    R06.9 DOE  History:        Patient has prior history of Echocardiogram examinations, most                 recent 05/14/2023. Pulmonary HTN, Arrythmias:Atrial Fibrillation;                 Risk Factors:Hypertension, Diabetes and Dyslipidemia.  Sonographer:    Irving Burton Senior RDCS Referring Phys: [id number] Dorothe Pea BRANCH IMPRESSIONS  1. Left ventricular ejection fraction, by estimation, is 65 to 70%. The left ventricle has normal function. The left ventricle has no regional wall motion abnormalities. Left ventricular diastolic parameters are indeterminate.  2. Right ventricular systolic function is moderately reduced. The right ventricular size is moderately enlarged. There is severely elevated pulmonary artery systolic pressure.  3. Left atrial size was severely dilated.   4. Right atrial size was severely dilated.  5. A small pericardial effusion is present. The pericardial effusion is circumferential.  6. The mitral valve is abnormal. No evidence of mitral valve regurgitation. Mild mitral stenosis. The mean mitral valve gradient is 4.7 mmHg.Heart rate is 43 bpm.  7. The tricuspid valve is abnormal. Tricuspid valve regurgitation is moderate.  8. The aortic valve was not well visualized. Aortic valve regurgitation is not visualized. Mild aortic valve stenosis. Aortic valve area, by VTI measures 2.34 cm. Aortic valve mean gradient measures 11.0 mmHg.  9. The inferior vena cava is dilated in size with <50% respiratory variability, suggesting right atrial pressure of 15 mmHg. FINDINGS  Left Ventricle: Left ventricular ejection fraction, by estimation, is 65 to 70%. The left ventricle has normal function. The left ventricle has no regional wall motion abnormalities. The left ventricular internal cavity size was normal in size. There is  no left ventricular hypertrophy. Left ventricular diastolic parameters are indeterminate. Right Ventricle: The right ventricular size is moderately enlarged. Right vetricular wall thickness was not well visualized. Right ventricular systolic function is moderately reduced. There is severely elevated pulmonary artery systolic pressure. The tricuspid regurgitant velocity is 3.91 m/s, and with an assumed right atrial pressure of 15 mmHg, the estimated right ventricular systolic pressure is 76.2 mmHg. Left Atrium: Left atrial size was severely dilated. Right Atrium: Right atrial size was severely dilated. Pericardium: A small pericardial effusion is present. The pericardial effusion is circumferential. Mitral Valve: The mitral valve is abnormal. No evidence of mitral valve regurgitation. Mild mitral valve stenosis. MV peak gradient, 26.3 mmHg. The mean mitral valve gradient is 4.7 mmHg. Tricuspid Valve: The tricuspid valve is abnormal. Tricuspid valve  regurgitation is moderate . No evidence of tricuspid stenosis. Aortic Valve: The aortic valve was not well visualized. Aortic valve regurgitation is not visualized. Mild aortic stenosis is present. Aortic valve mean gradient measures 11.0 mmHg. Aortic valve peak gradient measures 26.2 mmHg. Aortic valve area, by VTI  measures 2.34 cm. Pulmonic Valve: The pulmonic valve was not well  visualized. Pulmonic valve regurgitation is not visualized. No evidence of pulmonic stenosis. Aorta: The aortic root and ascending aorta are structurally normal, with no evidence of dilitation. Venous: The inferior vena cava is dilated in size with less than 50% respiratory variability, suggesting right atrial pressure of 15 mmHg. IAS/Shunts: No atrial level shunt detected by color flow Doppler.  LEFT VENTRICLE PLAX 2D LVIDd:         5.00 cm LVIDs:         3.30 cm LV PW:         1.00 cm LV IVS:        0.90 cm LVOT diam:     2.10 cm LV SV:         144 LV SV Index:   76 LVOT Area:     3.46 cm  RIGHT VENTRICLE RV S prime:     11.50 cm/s TAPSE (M-mode): 2.8 cm LEFT ATRIUM              Index        RIGHT ATRIUM           Index LA diam:        6.20 cm  3.27 cm/m   RA Area:     40.20 cm LA Vol (A2C):   114.0 ml 60.21 ml/m  RA Volume:   163.00 ml 86.09 ml/m LA Vol (A4C):   130.0 ml 68.66 ml/m LA Biplane Vol: 124.0 ml 65.49 ml/m  AORTIC VALVE AV Area (Vmax):    2.07 cm AV Area (Vmean):   2.50 cm AV Area (VTI):     2.34 cm AV Vmax:           256.00 cm/s AV Vmean:          151.500 cm/s AV VTI:            0.613 m AV Peak Grad:      26.2 mmHg AV Mean Grad:      11.0 mmHg LVOT Vmax:         153.00 cm/s LVOT Vmean:        109.500 cm/s LVOT VTI:          0.415 m LVOT/AV VTI ratio: 0.68  AORTA Ao Root diam: 3.00 cm Ao Asc diam:  3.20 cm MITRAL VALVE             TRICUSPID VALVE MV Area VTI:  1.84 cm   TR Peak grad:   61.2 mmHg MV Peak grad: 26.3 mmHg  TR Vmax:        391.00 cm/s MV Mean grad: 4.7 mmHg MV Vmax:      2.56 m/s   SHUNTS MV Vmean:      84.5 cm/s  Systemic VTI:  0.42 m                          Systemic Diam: 2.10 cm Dina Rich MD Electronically signed by Dina Rich MD Signature Date/Time: 11/19/2023/3:34:23 PM    Final     Scheduled Meds:  dapagliflozin propanediol  10 mg Oral Daily   dextromethorphan-guaiFENesin  1 tablet Oral BID   furosemide  80 mg Intravenous TID   hydrALAZINE  25 mg Oral BID   insulin aspart  0-5 Units Subcutaneous QHS   insulin aspart  0-9 Units Subcutaneous TID WC   insulin aspart  3 Units Subcutaneous TID WC   ipratropium-albuterol  3 mL Nebulization BID   macitentan  10  mg Oral Daily   methylPREDNISolone (SOLU-MEDROL) injection  40 mg Intravenous Q12H   polyethylene glycol  17 g Oral Daily   tadalafil  20 mg Oral BID   Warfarin - Pharmacist Dosing Inpatient   Does not apply q1600   Continuous Infusions:   LOS: 7 days   Time spent: 48 mins  Emlyn Maves Laural Benes, MD How to contact the [facility] Attending or Consulting provider 7A - 7P or covering provider during after hours 7P -7A, for this patient?  Check the care team in [facility] and look for a) attending/consulting [facility] provider listed and b) the [facility] team listed Log into [email or web address] to find provider on call.  Locate the [facility] provider you are looking for under [facility] and page to a number that you can be directly reached. If you still have difficulty reaching the provider, please page the [facility] (Director on Call) for the Hospitalists listed on amion for assistance.  11/20/2023, 2:02 PM

## 2023-11-20 NOTE — Progress Notes (Signed)
Rounding Note    Patient Name: VARNELL ORVIS Date of Encounter: 11/20/2023  [facility] Cardiologist: [name], MD   Subjective   Some ongoing SOB  Inpatient Medications    Scheduled Meds:  dapagliflozin propanediol  10 mg Oral Daily   dextromethorphan-guaiFENesin  1 tablet Oral BID   furosemide  80 mg Intravenous TID   hydrALAZINE  25 mg Oral BID   insulin aspart  0-5 Units Subcutaneous QHS   insulin aspart  0-9 Units Subcutaneous TID WC   insulin aspart  3 Units Subcutaneous TID WC   ipratropium-albuterol  3 mL Nebulization BID   macitentan  10 mg Oral Daily   methylPREDNISolone (SOLU-MEDROL) injection  40 mg Intravenous Q12H   polyethylene glycol  17 g Oral Daily   tadalafil  20 mg Oral BID   Warfarin - Pharmacist Dosing Inpatient   Does not apply q1600   Continuous Infusions:  PRN Meds: acetaminophen **OR** acetaminophen, albuterol, ondansetron **OR** ondansetron (ZOFRAN) IV, mouth rinse   Vital Signs    Vitals:   11/19/23 2027 11/19/23 2111 11/20/23 0501 11/20/23 0900  BP: (!) 141/39  (!) 137/37   Pulse:   (!) 40   Resp: 18  20   Temp: 98.9 F (37.2 C)  98.7 F (37.1 C)   TempSrc:      SpO2: 97% 96% 98% 95%  Weight:   76.6 kg   Height:        Intake/Output Summary (Last 24 hours) at 11/20/2023 0916 Last data filed at 11/20/2023 0800 Gross per 24 hour  Intake 480 ml  Output 2400 ml  Net -1920 ml      11/20/2023    5:01 AM 11/19/2023    3:00 AM 11/18/2023    3:00 AM  Last 3 Weights  Weight (lbs) 168 lb 14 oz 171 lb 4.8 oz 173 lb 8 oz  Weight (kg) 76.6 kg 77.7 kg 78.7 kg      Telemetry    Afib slow VR - Personally Reviewed  ECG    N/a - Personally Reviewed  Physical Exam   GEN: No acute distress.   Neck: elevated JVD Cardiac: irreg Respiratory: crackles bilaterally GI: Soft, nontender, non-distended  MS: 2+ bilateral LE edema Neuro:  Nonfocal  Psych: Normal affect   Labs    High Sensitivity Troponin:   Recent  Labs  Lab 11/13/23 1050 11/13/23 1305  TROPONINIHS 80* 88*     Chemistry Recent Labs  Lab 11/15/23 0508 11/16/23 0506 11/17/23 0416 11/18/23 0407 11/19/23 0509 11/20/23 0436  NA 134* 136 134* 134* 136 136  K 3.9 4.6 4.5 4.3 4.5 4.2  CL 97* 100 97* 96* 96* 97*  CO2 24 23 24 25 27 29   GLUCOSE 172* 273* 273* 280* 290* 280*  BUN 87* 101* 117* 123* 122* 121*  CREATININE 2.46* 2.61* 2.78* 2.58* 2.39* 2.19*  CALCIUM 8.3* 8.1* 7.9* 8.0* 8.0* 8.0*  MG 2.5* 2.7* 2.8* 2.8* 2.7* 2.7*  PROT 6.6 6.6 6.3*  --   --   --   ALBUMIN 2.4* 2.3* 2.3*  --   --   --   AST 74* 51* 37  --   --   --   ALT 53* 47* 41  --   --   --   ALKPHOS 200* 185* 158*  --   --   --   BILITOT 0.9 0.9 0.8  --   --   --   GFRNONAA 26* 24* 22*  24* 26* 29*  ANIONGAP 13 13 13 13 13 10     Lipids No results for input(s): "CHOL", "TRIG", "HDL", "LABVLDL", "LDLCALC", "CHOLHDL" in the last 168 hours.  Hematology Recent Labs  Lab 11/18/23 0407 11/19/23 0509 11/20/23 0436  WBC 20.1* 16.4* 16.0*  RBC 2.87* 3.04* 3.02*  HGB 8.3* 8.5* 8.6*  HCT 25.6* 26.9* 26.7*  MCV 89.2 88.5 88.4  MCH 28.9 28.0 28.5  MCHC 32.4 31.6 32.2  RDW 16.1* 16.0* 16.3*  PLT 318 334 327   Thyroid No results for input(s): "TSH", "FREET4" in the last 168 hours.  BNP Recent Labs  Lab 11/13/23 1050 11/15/23 1810  BNP 816.0* 586.0*    DDimer No results for input(s): "DDIMER" in the last 168 hours.   Radiology    ECHOCARDIOGRAM COMPLETE Result Date: 11/19/2023    ECHOCARDIOGRAM REPORT   Patient Name:   HAGOP MCCOLLAM [facility] Date of Exam: 11/19/2023 Medical Rec #:  [id number]   Height:       67.0 in Accession #:    [id number]  Weight:       171.3 lb Date of Birth:  07-15-1941   BSA:          1.893 m Patient Age:    83 years    BP:           153/42 mmHg Patient Gender: M           HR:           41 bpm. [facility] Procedure: 2D Echo, Color Doppler and Cardiac Doppler Indications:    R06.9 DOE  History:        Patient has prior history of  Echocardiogram examinations, most                 recent 05/14/2023. Pulmonary HTN, Arrythmias:Atrial Fibrillation;                 Risk Factors:Hypertension, Diabetes and Dyslipidemia.  Sonographer:    Irving Burton Senior RDCS Referring Phys: [id number] Dorothe Pea Dailan Pfalzgraf IMPRESSIONS  1. Left ventricular ejection fraction, by estimation, is 65 to 70%. The left ventricle has normal function. The left ventricle has no regional wall motion abnormalities. Left ventricular diastolic parameters are indeterminate.  2. Right ventricular systolic function is moderately reduced. The right ventricular size is moderately enlarged. There is severely elevated pulmonary artery systolic pressure.  3. Left atrial size was severely dilated.  4. Right atrial size was severely dilated.  5. A small pericardial effusion is present. The pericardial effusion is circumferential.  6. The mitral valve is abnormal. No evidence of mitral valve regurgitation. Mild mitral stenosis. The mean mitral valve gradient is 4.7 mmHg.Heart rate is 43 bpm.  7. The tricuspid valve is abnormal. Tricuspid valve regurgitation is moderate.  8. The aortic valve was not well visualized. Aortic valve regurgitation is not visualized. Mild aortic valve stenosis. Aortic valve area, by VTI measures 2.34 cm. Aortic valve mean gradient measures 11.0 mmHg.  9. The inferior vena cava is dilated in size with <50% respiratory variability, suggesting right atrial pressure of 15 mmHg. FINDINGS  Left Ventricle: Left ventricular ejection fraction, by estimation, is 65 to 70%. The left ventricle has normal function. The left ventricle has no regional wall motion abnormalities. The left ventricular internal cavity size was normal in size. There is  no left ventricular hypertrophy. Left ventricular diastolic parameters are indeterminate. Right Ventricle: The right ventricular size is moderately enlarged. Right vetricular wall thickness was  not well visualized. Right ventricular systolic  function is moderately reduced. There is severely elevated pulmonary artery systolic pressure. The tricuspid regurgitant velocity is 3.91 m/s, and with an assumed right atrial pressure of 15 mmHg, the estimated right ventricular systolic pressure is 76.2 mmHg. Left Atrium: Left atrial size was severely dilated. Right Atrium: Right atrial size was severely dilated. Pericardium: A small pericardial effusion is present. The pericardial effusion is circumferential. Mitral Valve: The mitral valve is abnormal. No evidence of mitral valve regurgitation. Mild mitral valve stenosis. MV peak gradient, 26.3 mmHg. The mean mitral valve gradient is 4.7 mmHg. Tricuspid Valve: The tricuspid valve is abnormal. Tricuspid valve regurgitation is moderate . No evidence of tricuspid stenosis. Aortic Valve: The aortic valve was not well visualized. Aortic valve regurgitation is not visualized. Mild aortic stenosis is present. Aortic valve mean gradient measures 11.0 mmHg. Aortic valve peak gradient measures 26.2 mmHg. Aortic valve area, by VTI  measures 2.34 cm. Pulmonic Valve: The pulmonic valve was not well visualized. Pulmonic valve regurgitation is not visualized. No evidence of pulmonic stenosis. Aorta: The aortic root and ascending aorta are structurally normal, with no evidence of dilitation. Venous: The inferior vena cava is dilated in size with less than 50% respiratory variability, suggesting right atrial pressure of 15 mmHg. IAS/Shunts: No atrial level shunt detected by color flow Doppler.  LEFT VENTRICLE PLAX 2D LVIDd:         5.00 cm LVIDs:         3.30 cm LV PW:         1.00 cm LV IVS:        0.90 cm LVOT diam:     2.10 cm LV SV:         144 LV SV Index:   76 LVOT Area:     3.46 cm  RIGHT VENTRICLE RV S prime:     11.50 cm/s TAPSE (M-mode): 2.8 cm LEFT ATRIUM              Index        RIGHT ATRIUM           Index LA diam:        6.20 cm  3.27 cm/m   RA Area:     40.20 cm LA Vol (A2C):   114.0 ml 60.21 ml/m  RA Volume:    163.00 ml 86.09 ml/m LA Vol (A4C):   130.0 ml 68.66 ml/m LA Biplane Vol: 124.0 ml 65.49 ml/m  AORTIC VALVE AV Area (Vmax):    2.07 cm AV Area (Vmean):   2.50 cm AV Area (VTI):     2.34 cm AV Vmax:           256.00 cm/s AV Vmean:          151.500 cm/s AV VTI:            0.613 m AV Peak Grad:      26.2 mmHg AV Mean Grad:      11.0 mmHg LVOT Vmax:         153.00 cm/s LVOT Vmean:        109.500 cm/s LVOT VTI:          0.415 m LVOT/AV VTI ratio: 0.68  AORTA Ao Root diam: 3.00 cm Ao Asc diam:  3.20 cm MITRAL VALVE             TRICUSPID VALVE MV Area VTI:  1.84 cm   TR Peak grad:   61.2 mmHg MV Peak  grad: 26.3 mmHg  TR Vmax:        391.00 cm/s MV Mean grad: 4.7 mmHg MV Vmax:      2.56 m/s   SHUNTS MV Vmean:     84.5 cm/s  Systemic VTI:  0.42 m                          Systemic Diam: 2.10 cm Dina Rich MD Electronically signed by Dina Rich MD Signature Date/Time: 11/19/2023/3:34:23 PM    Final     Cardiac Studies     Patient Profile     GRANGER CHUI is a 83 y.o. 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 [name].   Assessment & Plan    1.Acute on chronic HFpEF/Pulmonary HTN/RV dysfunction - 04/2023 echo: LVEF 60-65%, no MAs, indet diastolic dysfunction. D shaped septum, mild RV dysfunction, severe pulm HTN PASP 78 - 04/2023 RHC: mean PA 44, PCWP 17, CI 4.1, PVR 3.5 woods units - Jan 2025 echo: LVE 65-70%, no WMAs, indet diastolic, mod RV enlargement and moderate dysfunction, severe pulm HTN, severe BAE, mild MS, mild AS mean grad 11, dilated fixed IVC  - followed in HF clinic for combined pre and postcapillary pulmonary HTN. Extensive workup without clear precapillary etiology, suspicion of  possible group I, he has been on macitentan and tadalafil. Postcapillary hemodynamics are related to his HFpEF.    - admitted SOB primarily related to multifocal pneumonia, has had some signs of fluid overload as well.  BNP 816-->586, CT primarily pneumonia  - negative 1.7 L yesterday, negative 1.9 L since admission. He is on IV lasix tid, downtrend in Cr with diuresis consistent with venous congestion and HF. If weight accurate down 3 lbs from yesterday.  - continue IV lasix today, remains fluid overloaded.        2.Afib with chronic slow ventricular response - chronic slow rates from chart review, at times 30s to 40s. In absence of symptoms has been just being monitored.  - off av nodal agents.  - he is on coumadin     3.Multifocal pneumonia - CT chest: extensive patchy consolidative opacities bilateral, consistent with multifocal pneumonia - WBC in the 30s on admission, resolving.    4.Anemia - per primary team, Hgb 8.6 this AM  For questions or updates, please contact [facility] Please consult [email or web address] for contact info under        Signed, Dina Rich, MD  11/20/2023, 9:16 AM

## 2023-11-20 NOTE — Hospital Course (Signed)
83 y.o. male with medical history significant for type 2 diabetes, hypertension, atrial fibrillation on Coumadin, dyslipidemia, HFpEF, pulmonary hypertension, and CKD stage IIIb-IV, who presented to the ED with worsening cough and shortness of breath along with generalized weakness.  Patient was admitted with acute hypoxemic respiratory failure secondary to multifocal community-acquired pneumonia and was started on empiric antibiotics.  He has now completed course of antibiotics and is currently experiencing acute HFpEF with RV dysfunction and has been started on aggressive IV Lasix for diuresis.  Cardiology consulted for assistance in management.

## 2023-11-20 NOTE — Progress Notes (Signed)
PHARMACY - ANTICOAGULATION CONSULT NOTE  Pharmacy Consult for warfarin Indication: atrial fibrillation  Allergies  Allergen Reactions   Other Rash    Bandaids    Patient Measurements: Height: 5\' 7"  (170.2 cm) Weight: 76.6 kg (168 lb 14 oz) IBW/kg (Calculated) : 66.1  Vital Signs: Temp: 98.7 F (37.1 C) (01/29 0501) BP: 137/37 (01/29 0501) Pulse Rate: 40 (01/29 0501)  Labs: Recent Labs    11/18/23 0407 11/19/23 0509 11/20/23 0436  HGB 8.3* 8.5* 8.6*  HCT 25.6* 26.9* 26.7*  PLT 318 334 327  LABPROT 28.1* 30.0* 32.7*  INR 2.6* 2.8* 3.2*  CREATININE 2.58* 2.39* 2.19*    Estimated Creatinine Clearance: 24.3 mL/min (A) (by C-G formula based on SCr of 2.19 mg/dL (H)).   Medical History: Past Medical History:  Diagnosis Date   (HFpEF) heart failure with preserved ejection fraction (HCC)    Arthritis    Atrial fibrillation (HCC)    Bradycardia    Carotid artery disease (HCC)    Hyperlipidemia    Hypertension    Pulmonary hypertension (HCC)    Type 2 diabetes mellitus (HCC)     Assessment: 83 year old male with known afib on chronic warfarin therapy. INR elevated initially at 7.2. Hemoglobin trending down 10.2>9.2>8.5, no bleeding issues noted.   Home dose listed as 7.5 mg on Mon + Fri and 5 mg ROW.  1/29: INR up to 3.2- slightly supratherapeutic  Goal of Therapy:  INR 2-3 Monitor platelets by anticoagulation protocol: Yes   Plan:  Hold warfarin x 1 dose. Daily INR  Judeth Cornfield, PharmD Clinical Pharmacist 11/20/2023 8:24 AM

## 2023-11-20 NOTE — Progress Notes (Signed)
   11/20/23 0800  ReDS Vest / Clip  Station Marker D  Ruler Value 32  ReDS Value Range (!) > 40  ReDS Actual Value 48

## 2023-11-20 NOTE — Inpatient Diabetes Management (Signed)
Inpatient Diabetes Program Recommendations  AACE/ADA: New Consensus Statement on Inpatient Glycemic Control (2015)  Target Ranges:  Prepandial:   less than 140 mg/dL      Peak postprandial:   less than 180 mg/dL (1-2 hours)      Critically ill patients:  140 - 180 mg/dL   Lab Results  Component Value Date   GLUCAP 290 (H) 11/20/2023   HGBA1C 7.4 (H) 11/14/2023    Review of Glycemic Control  Latest Reference Range & Units 11/19/23 08:27 11/19/23 11:54 11/19/23 16:30 11/19/23 20:25 11/20/23 07:38  Glucose-Capillary 70 - 99 mg/dL 161 (H) 096 (H) 045 (H) 280 (H) 290 (H)   Diabetes history: DM2 Outpatient Diabetes medications: Farxiga 10 mg daily Current orders for Inpatient glycemic control:  Novolog 0-9 units tid + hs Farxiga 10 mg every day Novolog 3 units TID  Solumedrol 40 mg BID  Inpatient Diabetes Program Recommendations:     -   consider Semglee 8 units every day -   Increase Novolog 5 units TID (assuming patient consuming >50% of meal and with steroids)  Thanks, Christena Deem RN, MSN, BC-ADM Inpatient Diabetes Coordinator Team Pager [phone] (8a-5p)

## 2023-11-20 NOTE — Progress Notes (Signed)
Mobility Specialist Progress Note:    11/20/23 1445  Mobility  Activity Ambulated with assistance in hallway  Level of Assistance Contact guard assist, steadying assist  Assistive Device None  Distance Ambulated (ft) 55 ft  Range of Motion/Exercises Active;All extremities  Activity Response Tolerated well  Mobility Referral Yes  Mobility visit 1 Mobility  Mobility Specialist Start Time (ACUTE ONLY) 1425  Mobility Specialist Stop Time (ACUTE ONLY) 1445  Mobility Specialist Time Calculation (min) (ACUTE ONLY) 20 min   Pt received in chair, agreeable to mobility. Required CGA to stand and and ambulate with no AD. Tolerated well, see O2 sats below. Returned to room, left pt in chair. Alarm on and call bell in reach. All needs met.  SpO2 92% on 1L at rest. SpO2 varied between 88-92% on 2L during ambulation. SpO2 94% on 1L after session.   Lawerance Bach Mobility Specialist Please contact via Special educational needs teacher or  Rehab office at [phone]

## 2023-11-21 DIAGNOSIS — J9601 Acute respiratory failure with hypoxia: Secondary | ICD-10-CM | POA: Diagnosis not present

## 2023-11-21 DIAGNOSIS — I272 Pulmonary hypertension, unspecified: Secondary | ICD-10-CM | POA: Diagnosis not present

## 2023-11-21 DIAGNOSIS — I5033 Acute on chronic diastolic (congestive) heart failure: Secondary | ICD-10-CM | POA: Diagnosis not present

## 2023-11-21 DIAGNOSIS — I10 Essential (primary) hypertension: Secondary | ICD-10-CM | POA: Diagnosis not present

## 2023-11-21 LAB — CBC WITH DIFFERENTIAL/PLATELET
Abs Immature Granulocytes: 0.53 10*3/uL — ABNORMAL HIGH (ref 0.00–0.07)
Basophils Absolute: 0 10*3/uL (ref 0.0–0.1)
Basophils Relative: 0 %
Eosinophils Absolute: 0 10*3/uL (ref 0.0–0.5)
Eosinophils Relative: 0 %
HCT: 26.9 % — ABNORMAL LOW (ref 39.0–52.0)
Hemoglobin: 8.5 g/dL — ABNORMAL LOW (ref 13.0–17.0)
Immature Granulocytes: 3 %
Lymphocytes Relative: 4 %
Lymphs Abs: 0.6 10*3/uL — ABNORMAL LOW (ref 0.7–4.0)
MCH: 28.1 pg (ref 26.0–34.0)
MCHC: 31.6 g/dL (ref 30.0–36.0)
MCV: 89.1 fL (ref 80.0–100.0)
Monocytes Absolute: 0.5 10*3/uL (ref 0.1–1.0)
Monocytes Relative: 3 %
Neutro Abs: 14.9 10*3/uL — ABNORMAL HIGH (ref 1.7–7.7)
Neutrophils Relative %: 90 %
Platelets: 333 10*3/uL (ref 150–400)
RBC: 3.02 MIL/uL — ABNORMAL LOW (ref 4.22–5.81)
RDW: 16.4 % — ABNORMAL HIGH (ref 11.5–15.5)
WBC: 16.6 10*3/uL — ABNORMAL HIGH (ref 4.0–10.5)
nRBC: 0 % (ref 0.0–0.2)

## 2023-11-21 LAB — BASIC METABOLIC PANEL
Anion gap: 11 (ref 5–15)
BUN: 116 mg/dL — ABNORMAL HIGH (ref 8–23)
CO2: 29 mmol/L (ref 22–32)
Calcium: 8 mg/dL — ABNORMAL LOW (ref 8.9–10.3)
Chloride: 96 mmol/L — ABNORMAL LOW (ref 98–111)
Creatinine, Ser: 2.1 mg/dL — ABNORMAL HIGH (ref 0.61–1.24)
GFR, Estimated: 31 mL/min — ABNORMAL LOW (ref >60)
Glucose, Bld: 164 mg/dL — ABNORMAL HIGH (ref 70–99)
Potassium: 4 mmol/L (ref 3.5–5.1)
Sodium: 136 mmol/L (ref 135–145)

## 2023-11-21 LAB — GLUCOSE, CAPILLARY
Glucose-Capillary: 168 mg/dL — ABNORMAL HIGH (ref 70–99)
Glucose-Capillary: 207 mg/dL — ABNORMAL HIGH (ref 70–99)
Glucose-Capillary: 148 mg/dL — ABNORMAL HIGH (ref 70–99)
Glucose-Capillary: 196 mg/dL — ABNORMAL HIGH (ref 70–99)

## 2023-11-21 LAB — PROTIME-INR
INR: 2.7 — ABNORMAL HIGH (ref 0.8–1.2)
Prothrombin Time: 28.9 s — ABNORMAL HIGH (ref 11.4–15.2)

## 2023-11-21 LAB — MAGNESIUM: Magnesium: 2.6 mg/dL — ABNORMAL HIGH (ref 1.7–2.4)

## 2023-11-21 MED ORDER — PREDNISONE 20 MG PO TABS
20.0000 mg | ORAL_TABLET | Freq: Every day | ORAL | Status: AC
  Administered 2023-11-22 – 2023-11-25 (×4): 20 mg via ORAL
  Filled 2023-11-21 (×4): qty 1

## 2023-11-21 MED ORDER — WARFARIN SODIUM 2.5 MG PO TABS
2.5000 mg | ORAL_TABLET | Freq: Once | ORAL | Status: AC
  Administered 2023-11-21: 2.5 mg via ORAL
  Filled 2023-11-21: qty 1

## 2023-11-21 MED ORDER — METOLAZONE 5 MG PO TABS
5.0000 mg | ORAL_TABLET | Freq: Once | ORAL | Status: AC
  Administered 2023-11-21: 5 mg via ORAL
  Filled 2023-11-21: qty 1

## 2023-11-21 NOTE — Progress Notes (Signed)
PROGRESS NOTE   Robert Baldwin  ZOX:[id number] DOB: [birth year] DOA: 11/13/2023 PCP: Kirstie Peri, MD   Chief Complaint  Patient presents with   hypoxia   Level of care: Telemetry  Brief Admission History:  83 y.o. male with medical history significant for type 2 diabetes, hypertension, atrial fibrillation on Coumadin, dyslipidemia, HFpEF, pulmonary hypertension, and CKD stage IIIb-IV, who presented to the ED with worsening cough and shortness of breath along with generalized weakness.  Patient was admitted with acute hypoxemic respiratory failure secondary to multifocal community-acquired pneumonia and was started on empiric antibiotics.  He has now completed course of antibiotics and is currently experiencing acute HFpEF with RV dysfunction and has been started on aggressive IV Lasix for diuresis.  Cardiology consulted for assistance in management.     Assessment and Plan:  Acute hypoxemic respiratory failure persisting in the setting of acute HFpEF with RV dysfunction -completed azithromycin and Rocephin  -Monitor cultures: no growth to date  -Wean oxygen as tolerated -Flu COVID and RSV negative -Blood cultures negative -Continue scheduled breathing treatments and Mucinex -Continue IV Solu-Medrol with plan to de-escalate over next day to oral prednisone -Up in chair and incentive spirometry encouraged -Chest x-ray 1/24 with cardiomegaly and pulmonary edema now on diuresis with IV Lasix 80 mg 3 times daily and metolazone added on 11/21/2023 by cardiology team x 1 dose -Appreciate cardiology assistance with 2D echocardiogram ordered-noted below LVEF 65-70% indeterminate diastolic parameters, Right ventricular systolic function is moderately reduced. The right ventricular size is moderately enlarged. There is severely elevated pulmonary artery systolic pressure , mild aortic valve stenosis Filed Weights   11/19/23 0300 11/20/23 0501 11/21/23 0305  Weight: 77.7 kg 76.6 kg 76.5 kg     Intake/Output Summary (Last 24 hours) at 11/21/2023 1045 Last data filed at 11/21/2023 1028 Gross per 24 hour  Intake 1620 ml  Output 1225 ml  Net 395 ml   Multifocal CAP -Completed treatment with azithromycin and Rocephin for 5 days -weaning steroids   Transaminitis with alkaline phosphatase elevation-resolved -Acute hepatitis panel negative -Ultrasound abdomen without any significant findings -atorvastatin resumed   Normocytic Anemia -No overt bleeding noted, continue to follow trend with stabilization noted -INR improving to 3.2, continue to monitor -stool occult pending   HFpEF with RV dysfunction -2D echocardiogram 04/2022 EF 70-75% -Continue tadalafil and Opsumit -Farxiga -Repeat 2D echocardiogram as noted above by cardiology -remains volume overloaded but has been diuresing well on IV furosemide  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 07/2022 -resumed atorvastatin   Type 2 diabetes with hyperglycemia Uncontrolled with neurological complications -Continue SGLT2i -Continue carb modified diet -Added SSI -added semglee and prandial novolog coverage -steroid induced hyperglycemia - weaning steroid CBG (last 3)  Recent Labs    11/20/23 1639 11/20/23 2121 11/21/23 0719  GLUCAP 283* 165* 168*   Permanent atrial fibrillation -Bradycardic and not on AV nodal blockade agents -Currently on Coumadin with supratherapeutic INR -Hold Coumadin for now, appreciate pharmacy assistance -Vitamin K given 1/24   OSA on CPAP -Continue CPAP at night   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  No cough.  Objective: Vitals:   11/21/23 0305 11/21/23 0919 11/21/23 0923 11/21/23 0945  BP: (!) 142/53 (!) 162/49    Pulse: (!) 48 (!) 50    Resp: 18     Temp: 98.3 F (36.8 C)     TempSrc:      SpO2: 92% 93% (!) 87% 94%  Weight: 76.5 kg     Height:        Intake/Output Summary (Last 24 hours) at 11/21/2023 1045 Last data filed at 11/21/2023 1028 Gross per 24 hour  Intake 1620 ml  Output 1225 ml  Net 395 ml   Filed Weights   11/19/23 0300 11/20/23 0501 11/21/23 0305  Weight: 77.7 kg 76.6 kg 76.5 kg   Examination:  General exam: Appears calm and comfortable  Respiratory system: Clear to auscultation.  No crackles heard.  Respiratory effort normal. Cardiovascular system: normal S1 & S2 heard. No JVD, murmurs, rubs, gallops or clicks. No pedal edema. Gastrointestinal system: Abdomen is nondistended, soft and nontender. No organomegaly or masses felt. Normal bowel sounds heard. Central nervous system: Alert and oriented. No focal neurological deficits. Extremities: 1+ edema BLEs pitting Symmetric 5 x 5 power. Skin: No rashes, lesions or ulcers. Psychiatry: Judgement and insight appear normal. Mood & affect appropriate.   Data Reviewed: I have personally reviewed following labs and imaging studies  CBC: Recent Labs  Lab 11/15/23 0508 11/15/23 1810 11/17/23 0416 11/18/23 0407 11/19/23 0509 11/20/23 0436 11/21/23 0439  WBC 25.4*   < > 23.8* 20.1* 16.4* 16.0* 16.6*  NEUTROABS 22.4*  --   --   --  14.3*  --  14.9*  HGB 8.8*   < > 8.1* 8.3* 8.5* 8.6* 8.5*  HCT 27.5*   < > 25.7* 25.6* 26.9* 26.7* 26.9*  MCV 89.9   < > 89.5 89.2 88.5 88.4 89.1  PLT 260   < > 290 318 334 327 333   < > = values in this interval not displayed.    Basic Metabolic Panel: Recent Labs  Lab 11/17/23 0416 11/18/23 0407  11/19/23 0509 11/20/23 0436 11/21/23 0439  NA 134* 134* 136 136 136  K 4.5 4.3 4.5 4.2 4.0  CL 97* 96* 96* 97* 96*  CO2 24 25 27 29 29   GLUCOSE 273* 280* 290* 280* 164*  BUN 117* 123* 122* 121* 116*  CREATININE 2.78* 2.58* 2.39* 2.19* 2.10*  CALCIUM 7.9* 8.0* 8.0* 8.0* 8.0*  MG 2.8* 2.8* 2.7* 2.7* 2.6*    CBG: Recent Labs  Lab 11/20/23 0738 11/20/23 1107 11/20/23 1639 11/20/23 2121 11/21/23 0719  GLUCAP 290* 294* 283* 165* 168*    Recent Results (from the past 240 hours)  Resp panel by RT-PCR (RSV, Flu A&B, Covid) Anterior Nasal Swab     Status: None   Collection Time: 11/13/23 10:25 AM   Specimen: Anterior Nasal Swab  Result Value Ref Range Status   SARS Coronavirus 2 by RT PCR NEGATIVE NEGATIVE Final    Comment: (NOTE) SARS-CoV-2 target nucleic acids are NOT DETECTED.  The SARS-CoV-2 RNA is generally detectable in upper respiratory specimens during the acute phase of infection. The lowest concentration of SARS-CoV-2 viral copies this assay can detect is 138 copies/mL. A negative result does not preclude SARS-Cov-2 infection and should not be used as the sole basis for treatment or other patient management decisions. A negative result may occur with  improper specimen collection/handling, submission of  specimen other than nasopharyngeal swab, presence of viral mutation(s) within the areas targeted by this assay, and inadequate number of viral copies(<138 copies/mL). A negative result must be combined with clinical observations, patient history, and epidemiological information. The expected result is Negative.  Fact Sheet for Patients:  BloggerCourse.com  Fact Sheet for Healthcare Providers:  SeriousBroker.it  This test is no t yet approved or cleared by the [place] FDA and  has been authorized for detection and/or diagnosis of SARS-CoV-2 by FDA under an Emergency Use Authorization (EUA). This EUA will  remain  in effect (meaning this test can be used) for the duration of the COVID-19 declaration under Section 564(b)(1) of the Act, 21 U.S.C.section 360bbb-3(b)(1), unless the authorization is terminated  or revoked sooner.       Influenza A by PCR NEGATIVE NEGATIVE Final   Influenza B by PCR NEGATIVE NEGATIVE Final    Comment: (NOTE) The Xpert Xpress SARS-CoV-2/FLU/RSV plus assay is intended as an aid in the diagnosis of influenza from Nasopharyngeal swab specimens and should not be used as a sole basis for treatment. Nasal washings and aspirates are unacceptable for Xpert Xpress SARS-CoV-2/FLU/RSV testing.  Fact Sheet for Patients: BloggerCourse.com  Fact Sheet for Healthcare Providers: SeriousBroker.it  This test is not yet approved or cleared by the [place] FDA and has been authorized for detection and/or diagnosis of SARS-CoV-2 by FDA under an Emergency Use Authorization (EUA). This EUA will remain in effect (meaning this test can be used) for the duration of the COVID-19 declaration under Section 564(b)(1) of the Act, 21 U.S.C. section 360bbb-3(b)(1), unless the authorization is terminated or revoked.     Resp Syncytial Virus by PCR NEGATIVE NEGATIVE Final    Comment: (NOTE) Fact Sheet for Patients: BloggerCourse.com  Fact Sheet for Healthcare Providers: SeriousBroker.it  This test is not yet approved or cleared by the [place] FDA and has been authorized for detection and/or diagnosis of SARS-CoV-2 by FDA under an Emergency Use Authorization (EUA). This EUA will remain in effect (meaning this test can be used) for the duration of the COVID-19 declaration under Section 564(b)(1) of the Act, 21 U.S.C. section 360bbb-3(b)(1), unless the authorization is terminated or revoked.  Performed at [facility], [address], [place], [place] [address]   Blood  culture (routine x 2)     Status: None   Collection Time: 11/13/23  1:05 PM   Specimen: BLOOD  Result Value Ref Range Status   Specimen Description BLOOD BLOOD LEFT HAND  Final   Special Requests   Final    Blood Culture results may not be optimal due to an inadequate volume of blood received in culture bottles BOTTLES DRAWN AEROBIC AND ANAEROBIC   Culture   Final    NO GROWTH 5 DAYS Performed at [facility], [address], [place], [place] [address]    Report Status 11/18/2023 FINAL  Final  Blood culture (routine x 2)     Status: None   Collection Time: 11/13/23  1:05 PM   Specimen: BLOOD  Result Value Ref Range Status   Specimen Description BLOOD BLOOD RIGHT HAND  Final   Special Requests   Final    Blood Culture results may not be optimal due to an inadequate volume of blood received in culture bottles BOTTLES DRAWN AEROBIC ONLY   Culture   Final    NO GROWTH 5 DAYS Performed at [facility], [address] [place], [place] [address]    Report Status 11/18/2023 FINAL  Final     Radiology Studies: ECHOCARDIOGRAM COMPLETE Result Date: 11/19/2023    ECHOCARDIOGRAM REPORT   Patient Name:   Robert Baldwin Date of Exam: 11/19/2023 Medical Rec #:  [id number]   Height:       67.0 in Accession #:    [id number]  Weight:       171.3 lb Date of Birth:  11-20-1940   BSA:          1.893 m Patient Age:    82 years    BP:           153/42 mmHg Patient Gender: M           HR:           41 bpm. [facility] Procedure: 2D Echo, Color Doppler and Cardiac Doppler Indications:    R06.9 DOE  History:        Patient has prior history of Echocardiogram examinations, most                 recent 05/14/2023. Pulmonary HTN, Arrythmias:Atrial Fibrillation;                 Risk Factors:Hypertension, Diabetes and Dyslipidemia.  Sonographer:    Irving Burton Senior RDCS Referring Phys: [id number] Dorothe Pea BRANCH IMPRESSIONS  1. Left ventricular ejection fraction, by estimation, is 65 to 70%. The left ventricle has  normal function. The left ventricle has no regional wall motion abnormalities. Left ventricular diastolic parameters are indeterminate.  2. Right ventricular systolic function is moderately reduced. The right ventricular size is moderately enlarged. There is severely elevated pulmonary artery systolic pressure.  3. Left atrial size was severely dilated.  4. Right atrial size was severely dilated.  5. A small pericardial effusion is present. The pericardial effusion is circumferential.  6. The mitral valve is abnormal. No evidence of mitral valve regurgitation. Mild mitral stenosis. The mean mitral valve gradient is 4.7 mmHg.Heart rate is 43 bpm.  7. The tricuspid valve is abnormal. Tricuspid valve regurgitation is moderate.  8. The aortic valve was not well visualized. Aortic valve regurgitation is not visualized. Mild aortic valve stenosis. Aortic valve area, by VTI measures 2.34 cm. Aortic valve mean gradient measures 11.0 mmHg.  9. The inferior vena cava is dilated in size with <50% respiratory variability, suggesting right atrial pressure of 15 mmHg. FINDINGS  Left Ventricle: Left ventricular ejection fraction, by estimation, is 65 to 70%. The left ventricle has normal function. The left ventricle has no regional wall motion abnormalities. The left ventricular internal cavity size was normal in size. There is  no left ventricular hypertrophy. Left ventricular diastolic parameters are indeterminate. Right Ventricle: The right ventricular size is moderately enlarged. Right vetricular wall thickness was not well visualized. Right ventricular systolic function is moderately reduced. There is severely elevated pulmonary artery systolic pressure. The tricuspid regurgitant velocity is 3.91 m/s, and with an assumed right atrial pressure of 15 mmHg, the estimated right ventricular systolic pressure is 76.2 mmHg. Left Atrium: Left atrial size was severely dilated. Right Atrium: Right atrial size was severely dilated.  Pericardium: A small pericardial effusion is present. The pericardial effusion is circumferential. Mitral Valve: The mitral valve is abnormal. No evidence of mitral valve regurgitation. Mild mitral valve stenosis. MV peak gradient, 26.3 mmHg. The mean mitral valve gradient is 4.7 mmHg. Tricuspid Valve: The tricuspid valve is abnormal. Tricuspid valve regurgitation is moderate . No evidence of tricuspid stenosis. Aortic Valve: The aortic valve was not  well visualized. Aortic valve regurgitation is not visualized. Mild aortic stenosis is present. Aortic valve mean gradient measures 11.0 mmHg. Aortic valve peak gradient measures 26.2 mmHg. Aortic valve area, by VTI  measures 2.34 cm. Pulmonic Valve: The pulmonic valve was not well visualized. Pulmonic valve regurgitation is not visualized. No evidence of pulmonic stenosis. Aorta: The aortic root and ascending aorta are structurally normal, with no evidence of dilitation. Venous: The inferior vena cava is dilated in size with less than 50% respiratory variability, suggesting right atrial pressure of 15 mmHg. IAS/Shunts: No atrial level shunt detected by color flow Doppler.  LEFT VENTRICLE PLAX 2D LVIDd:         5.00 cm LVIDs:         3.30 cm LV PW:         1.00 cm LV IVS:        0.90 cm LVOT diam:     2.10 cm LV SV:         144 LV SV Index:   76 LVOT Area:     3.46 cm  RIGHT VENTRICLE RV S prime:     11.50 cm/s TAPSE (M-mode): 2.8 cm LEFT ATRIUM              Index        RIGHT ATRIUM           Index LA diam:        6.20 cm  3.27 cm/m   RA Area:     40.20 cm LA Vol (A2C):   114.0 ml 60.21 ml/m  RA Volume:   163.00 ml 86.09 ml/m LA Vol (A4C):   130.0 ml 68.66 ml/m LA Biplane Vol: 124.0 ml 65.49 ml/m  AORTIC VALVE AV Area (Vmax):    2.07 cm AV Area (Vmean):   2.50 cm AV Area (VTI):     2.34 cm AV Vmax:           256.00 cm/s AV Vmean:          151.500 cm/s AV VTI:            0.613 m AV Peak Grad:      26.2 mmHg AV Mean Grad:      11.0 mmHg LVOT Vmax:          153.00 cm/s LVOT Vmean:        109.500 cm/s LVOT VTI:          0.415 m LVOT/AV VTI ratio: 0.68  AORTA Ao Root diam: 3.00 cm Ao Asc diam:  3.20 cm MITRAL VALVE             TRICUSPID VALVE MV Area VTI:  1.84 cm   TR Peak grad:   61.2 mmHg MV Peak grad: 26.3 mmHg  TR Vmax:        391.00 cm/s MV Mean grad: 4.7 mmHg MV Vmax:      2.56 m/s   SHUNTS MV Vmean:     84.5 cm/s  Systemic VTI:  0.42 m                          Systemic Diam: 2.10 cm Dina Rich MD Electronically signed by Dina Rich MD Signature Date/Time: 11/19/2023/3:34:23 PM    Final     Scheduled Meds:  atorvastatin  40 mg Oral QHS   dapagliflozin propanediol  10 mg Oral Daily   dextromethorphan-guaiFENesin  1 tablet Oral BID   furosemide  80 mg  Intravenous TID   hydrALAZINE  25 mg Oral BID   insulin aspart  0-5 Units Subcutaneous QHS   insulin aspart  0-9 Units Subcutaneous TID WC   insulin aspart  6 Units Subcutaneous TID WC   insulin glargine-yfgn  14 Units Subcutaneous Daily   ipratropium-albuterol  3 mL Nebulization BID   macitentan  10 mg Oral Daily   polyethylene glycol  17 g Oral Daily   predniSONE  40 mg Oral Q breakfast   tadalafil  20 mg Oral BID   warfarin  2.5 mg Oral ONCE-1600   Warfarin - Pharmacist Dosing Inpatient   Does not apply q1600   Continuous Infusions:   LOS: 8 days   Time spent: 50 mins  Tanaka Gillen Laural Benes, MD How to contact the [facility] Attending or Consulting provider 7A - 7P or covering provider during after hours 7P -7A, for this patient?  Check the care team in [facility] and look for a) attending/consulting [facility] provider listed and b) the [facility] team listed Log into [email or web address] to find provider on call.  Locate the [facility] provider you are looking for under [facility] and page to a number that you can be directly reached. If you still have difficulty reaching the provider, please page the [facility] (Director on Call) for the Hospitalists listed on amion for assistance.  11/21/2023, 10:45 AM

## 2023-11-21 NOTE — Progress Notes (Signed)
Rounding Note    Patient Name: Robert Baldwin Date of Encounter: 11/21/2023  [facility] Cardiologist: [name], MD   Subjective   Some ongoing SOB  Inpatient Medications    Scheduled Meds:  atorvastatin  40 mg Oral QHS   dapagliflozin propanediol  10 mg Oral Daily   dextromethorphan-guaiFENesin  1 tablet Oral BID   furosemide  80 mg Intravenous TID   hydrALAZINE  25 mg Oral BID   insulin aspart  0-5 Units Subcutaneous QHS   insulin aspart  0-9 Units Subcutaneous TID WC   insulin aspart  6 Units Subcutaneous TID WC   insulin glargine-yfgn  14 Units Subcutaneous Daily   ipratropium-albuterol  3 mL Nebulization BID   macitentan  10 mg Oral Daily   polyethylene glycol  17 g Oral Daily   predniSONE  40 mg Oral Q breakfast   tadalafil  20 mg Oral BID   warfarin  2.5 mg Oral ONCE-1600   Warfarin - Pharmacist Dosing Inpatient   Does not apply q1600   Continuous Infusions:  PRN Meds: acetaminophen **OR** acetaminophen, albuterol, ondansetron **OR** ondansetron (ZOFRAN) IV, mouth rinse   Vital Signs    Vitals:   11/20/23 2213 11/21/23 0305 11/21/23 0919 11/21/23 0923  BP:  (!) 142/53 (!) 162/49   Pulse:  (!) 48 (!) 50   Resp:  18    Temp:  98.3 F (36.8 C)    TempSrc:      SpO2: 93% 92% 93% (!) 87%  Weight:  76.5 kg    Height:        Intake/Output Summary (Last 24 hours) at 11/21/2023 0929 Last data filed at 11/21/2023 0300 Gross per 24 hour  Intake 1380 ml  Output 1200 ml  Net 180 ml      11/21/2023    3:05 AM 11/20/2023    5:01 AM 11/19/2023    3:00 AM  Last 3 Weights  Weight (lbs) 168 lb 10.4 oz 168 lb 14 oz 171 lb 4.8 oz  Weight (kg) 76.5 kg 76.6 kg 77.7 kg      Telemetry    Afib low VR - Personally Reviewed  ECG    N/a - Personally Reviewed  Physical Exam   GEN: No acute distress.   Neck: No JVD Cardiac: irreg, brady, 2/6 systolic murmur rusb and apex Respiratory: bilateral crackles GI: Soft, nontender, non-distended  MS: 2+  bilateral LE edema Neuro:  Nonfocal  Psych: Normal affect   Labs    High Sensitivity Troponin:   Recent Labs  Lab 11/13/23 1050 11/13/23 1305  TROPONINIHS 80* 88*     Chemistry Recent Labs  Lab 11/15/23 0508 11/16/23 0506 11/17/23 0416 11/18/23 0407 11/19/23 0509 11/20/23 0436 11/21/23 0439  NA 134* 136 134*   < > 136 136 136  K 3.9 4.6 4.5   < > 4.5 4.2 4.0  CL 97* 100 97*   < > 96* 97* 96*  CO2 24 23 24    < > 27 29 29   GLUCOSE 172* 273* 273*   < > 290* 280* 164*  BUN 87* 101* 117*   < > 122* 121* 116*  CREATININE 2.46* 2.61* 2.78*   < > 2.39* 2.19* 2.10*  CALCIUM 8.3* 8.1* 7.9*   < > 8.0* 8.0* 8.0*  MG 2.5* 2.7* 2.8*   < > 2.7* 2.7* 2.6*  PROT 6.6 6.6 6.3*  --   --   --   --   ALBUMIN 2.4* 2.3*  2.3*  --   --   --   --   AST 74* 51* 37  --   --   --   --   ALT 53* 47* 41  --   --   --   --   ALKPHOS 200* 185* 158*  --   --   --   --   BILITOT 0.9 0.9 0.8  --   --   --   --   GFRNONAA 26* 24* 22*   < > 26* 29* 31*  ANIONGAP 13 13 13    < > 13 10 11    < > = values in this interval not displayed.    Lipids No results for input(s): "CHOL", "TRIG", "HDL", "LABVLDL", "LDLCALC", "CHOLHDL" in the last 168 hours.  Hematology Recent Labs  Lab 11/19/23 0509 11/20/23 0436 11/21/23 0439  WBC 16.4* 16.0* 16.6*  RBC 3.04* 3.02* 3.02*  HGB 8.5* 8.6* 8.5*  HCT 26.9* 26.7* 26.9*  MCV 88.5 88.4 89.1  MCH 28.0 28.5 28.1  MCHC 31.6 32.2 31.6  RDW 16.0* 16.3* 16.4*  PLT 334 327 333   Thyroid No results for input(s): "TSH", "FREET4" in the last 168 hours.  BNP Recent Labs  Lab 11/15/23 1810  BNP 586.0*    DDimer No results for input(s): "DDIMER" in the last 168 hours.   Radiology    ECHOCARDIOGRAM COMPLETE Result Date: 11/19/2023    ECHOCARDIOGRAM REPORT   Patient Name:   Robert Baldwin [facility] Date of Exam: 11/19/2023 Medical Rec #:  [id number]   Height:       67.0 in Accession #:    [id number]  Weight:       171.3 lb Date of Birth:  Sep 16, 1941   BSA:          1.893 m Patient Age:     83 years    BP:           153/42 mmHg Patient Gender: M           HR:           41 bpm. [facility] Procedure: 2D Echo, Color Doppler and Cardiac Doppler Indications:    R06.9 DOE  History:        Patient has prior history of Echocardiogram examinations, most                 recent 05/14/2023. Pulmonary HTN, Arrythmias:Atrial Fibrillation;                 Risk Factors:Hypertension, Diabetes and Dyslipidemia.  Sonographer:    Irving Burton Senior RDCS Referring Phys: [id number] Dorothe Pea Caci Orren IMPRESSIONS  1. Left ventricular ejection fraction, by estimation, is 65 to 70%. The left ventricle has normal function. The left ventricle has no regional wall motion abnormalities. Left ventricular diastolic parameters are indeterminate.  2. Right ventricular systolic function is moderately reduced. The right ventricular size is moderately enlarged. There is severely elevated pulmonary artery systolic pressure.  3. Left atrial size was severely dilated.  4. Right atrial size was severely dilated.  5. A small pericardial effusion is present. The pericardial effusion is circumferential.  6. The mitral valve is abnormal. No evidence of mitral valve regurgitation. Mild mitral stenosis. The mean mitral valve gradient is 4.7 mmHg.Heart rate is 43 bpm.  7. The tricuspid valve is abnormal. Tricuspid valve regurgitation is moderate.  8. The aortic valve was not well visualized. Aortic valve regurgitation is not visualized. Mild aortic valve stenosis.  Aortic valve area, by VTI measures 2.34 cm. Aortic valve mean gradient measures 11.0 mmHg.  9. The inferior vena cava is dilated in size with <50% respiratory variability, suggesting right atrial pressure of 15 mmHg. FINDINGS  Left Ventricle: Left ventricular ejection fraction, by estimation, is 65 to 70%. The left ventricle has normal function. The left ventricle has no regional wall motion abnormalities. The left ventricular internal cavity size was normal in size. There is  no  left ventricular hypertrophy. Left ventricular diastolic parameters are indeterminate. Right Ventricle: The right ventricular size is moderately enlarged. Right vetricular wall thickness was not well visualized. Right ventricular systolic function is moderately reduced. There is severely elevated pulmonary artery systolic pressure. The tricuspid regurgitant velocity is 3.91 m/s, and with an assumed right atrial pressure of 15 mmHg, the estimated right ventricular systolic pressure is 76.2 mmHg. Left Atrium: Left atrial size was severely dilated. Right Atrium: Right atrial size was severely dilated. Pericardium: A small pericardial effusion is present. The pericardial effusion is circumferential. Mitral Valve: The mitral valve is abnormal. No evidence of mitral valve regurgitation. Mild mitral valve stenosis. MV peak gradient, 26.3 mmHg. The mean mitral valve gradient is 4.7 mmHg. Tricuspid Valve: The tricuspid valve is abnormal. Tricuspid valve regurgitation is moderate . No evidence of tricuspid stenosis. Aortic Valve: The aortic valve was not well visualized. Aortic valve regurgitation is not visualized. Mild aortic stenosis is present. Aortic valve mean gradient measures 11.0 mmHg. Aortic valve peak gradient measures 26.2 mmHg. Aortic valve area, by VTI  measures 2.34 cm. Pulmonic Valve: The pulmonic valve was not well visualized. Pulmonic valve regurgitation is not visualized. No evidence of pulmonic stenosis. Aorta: The aortic root and ascending aorta are structurally normal, with no evidence of dilitation. Venous: The inferior vena cava is dilated in size with less than 50% respiratory variability, suggesting right atrial pressure of 15 mmHg. IAS/Shunts: No atrial level shunt detected by color flow Doppler.  LEFT VENTRICLE PLAX 2D LVIDd:         5.00 cm LVIDs:         3.30 cm LV PW:         1.00 cm LV IVS:        0.90 cm LVOT diam:     2.10 cm LV SV:         144 LV SV Index:   76 LVOT Area:     3.46 cm   RIGHT VENTRICLE RV S prime:     11.50 cm/s TAPSE (M-mode): 2.8 cm LEFT ATRIUM              Index        RIGHT ATRIUM           Index LA diam:        6.20 cm  3.27 cm/m   RA Area:     40.20 cm LA Vol (A2C):   114.0 ml 60.21 ml/m  RA Volume:   163.00 ml 86.09 ml/m LA Vol (A4C):   130.0 ml 68.66 ml/m LA Biplane Vol: 124.0 ml 65.49 ml/m  AORTIC VALVE AV Area (Vmax):    2.07 cm AV Area (Vmean):   2.50 cm AV Area (VTI):     2.34 cm AV Vmax:           256.00 cm/s AV Vmean:          151.500 cm/s AV VTI:            0.613 m AV Peak Grad:  26.2 mmHg AV Mean Grad:      11.0 mmHg LVOT Vmax:         153.00 cm/s LVOT Vmean:        109.500 cm/s LVOT VTI:          0.415 m LVOT/AV VTI ratio: 0.68  AORTA Ao Root diam: 3.00 cm Ao Asc diam:  3.20 cm MITRAL VALVE             TRICUSPID VALVE MV Area VTI:  1.84 cm   TR Peak grad:   61.2 mmHg MV Peak grad: 26.3 mmHg  TR Vmax:        391.00 cm/s MV Mean grad: 4.7 mmHg MV Vmax:      2.56 m/s   SHUNTS MV Vmean:     84.5 cm/s  Systemic VTI:  0.42 m                          Systemic Diam: 2.10 cm Dina Rich MD Electronically signed by Dina Rich MD Signature Date/Time: 11/19/2023/3:34:23 PM    Final     Cardiac Studies     Patient Profile     GRANT SWAGER is a 83 y.o. 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 [name].  Assessment & Plan    1.Acute on chronic HFpEF/Pulmonary HTN/RV dysfunction - 04/2023 echo: LVEF 60-65%, no MAs, indet diastolic dysfunction. D shaped septum, mild RV dysfunction, severe pulm HTN PASP 78 - 04/2023 RHC: mean PA 44, PCWP 17, CI 4.1, PVR 3.5 woods units - Jan 2025 echo: LVE 65-70%, no WMAs, indet diastolic, mod RV enlargement and moderate  dysfunction, severe pulm HTN, severe BAE, mild MS, mild AS mean grad 11, dilated fixed IVC   - followed in HF clinic for combined pre and postcapillary pulmonary HTN. Extensive workup without clear precapillary etiology, suspicion of possible group I, he has been on macitentan and tadalafil. Postcapillary hemodynamics are related to his HFpEF.    - admitted SOB primarily related to multifocal pneumonia, has had some signs of fluid overload as well.  BNP 816-->586, CT primarily pneumonia   - negative + 280 L yesterday, negative 1.6 L since admission. He is on IV lasix 80 tid,  downtrend in Cr with diuresis consistent with venous congestion and HF. Weights unchanged from yesterday. Diuresis has slowed, add metolazone 5mg  x 1 today, continue IV lasix 80mg  tid. Remains fluid overloaded.        2.Afib with chronic slow ventricular response - chronic slow rates from chart review, at times 30s to 40s. In absence of symptoms has been just being monitored.  - off av nodal agents.  - he is on coumadin     3.Multifocal pneumonia - CT chest: extensive patchy consolidative opacities bilateral, consistent with multifocal pneumonia - WBC in the 30s on admission, resolving.    4.Anemia - per primary team, Hgb 8.6 this AM  For questions or updates, please contact [facility] Please consult [email or web address] for contact info under        Signed, Dina Rich, MD  11/21/2023, 9:29 AM

## 2023-11-21 NOTE — Progress Notes (Signed)
Mobility Specialist Progress Note:    11/21/23 1055  Mobility  Activity Ambulated with assistance in hallway  Level of Assistance Contact guard assist, steadying assist  Assistive Device None  Distance Ambulated (ft) 50 ft  Range of Motion/Exercises Active;All extremities  Activity Response Tolerated well  Mobility Referral Yes  Mobility visit 1 Mobility  Mobility Specialist Start Time (ACUTE ONLY) 1035  Mobility Specialist Stop Time (ACUTE ONLY) 1055  Mobility Specialist Time Calculation (min) (ACUTE ONLY) 20 min   Pt received in chair,agreeable to mobility. Required CGA to stand and ambulate with no AD. Tolerated well, SpO2 fluctuated between 88-93% on 1L throughout ambulation.Returned to chair, SpO2 94% on 1L.Wife at bedside, call bell in hand.All needs met.   Lawerance Bach Mobility Specialist Please contact via Special educational needs teacher or  Rehab office at [phone]

## 2023-11-21 NOTE — Progress Notes (Signed)
   11/21/23 0946  ReDS Vest / Clip  Station Marker D  Ruler Value 32  ReDS Value Range 36 - 40  ReDS Actual Value 37

## 2023-11-21 NOTE — Care Management Important Message (Signed)
Important Message  Patient Details  Name: Robert Baldwin MRN: [id number] Date of Birth: 1941/07/26   Important Message Given:  Yes - Medicare IM     Corey Harold 11/21/2023, 10:48 AM

## 2023-11-21 NOTE — Plan of Care (Signed)

## 2023-11-21 NOTE — Progress Notes (Addendum)
PHARMACY - ANTICOAGULATION CONSULT NOTE  Pharmacy Consult for warfarin Indication: atrial fibrillation  Allergies  Allergen Reactions   Other Rash    Bandaids    Patient Measurements: Height: 5\' 7"  (170.2 cm) Weight: 76.5 kg (168 lb 10.4 oz) IBW/kg (Calculated) : 66.1  Vital Signs: Temp: 98.3 F (36.8 C) (01/30 0305) BP: 142/53 (01/30 0305) Pulse Rate: 48 (01/30 0305)  Labs: Recent Labs    11/19/23 0509 11/20/23 0436 11/21/23 0439  HGB 8.5* 8.6* 8.5*  HCT 26.9* 26.7* 26.9*  PLT 334 327 333  LABPROT 30.0* 32.7* 28.9*  INR 2.8* 3.2* 2.7*  CREATININE 2.39* 2.19* 2.10*    Estimated Creatinine Clearance: 25.4 mL/min (A) (by C-G formula based on SCr of 2.1 mg/dL (H)).   Medical History: Past Medical History:  Diagnosis Date   (HFpEF) heart failure with preserved ejection fraction (HCC)    Arthritis    Atrial fibrillation (HCC)    Bradycardia    Carotid artery disease (HCC)    Hyperlipidemia    Hypertension    Pulmonary hypertension (HCC)    Type 2 diabetes mellitus (HCC)     Assessment: 83 year old male with known afib on chronic warfarin therapy. INR elevated initially at 7.2. Hemoglobin trending down 10.2>9.2>8.5, no bleeding issues noted.   Home dose listed as 7.5 mg on Mon + Fri and 5 mg ROW.   INR  3.2> 2.7- back to therapeutic after holding dose yesterday. Will likely need reduced dosing than prior home regimen based on current admission  1/26: 5 mg 1/27: 5 mg 1/28: 2.5 mg 1/29: hold dose  Goal of Therapy:  INR 2-3 Monitor platelets by anticoagulation protocol: Yes   Plan:  Warfarin 2.5 mg x 1 dose. Daily INR  Judeth Cornfield, PharmD Clinical Pharmacist 11/21/2023 8:14 AM

## 2023-11-21 NOTE — Plan of Care (Signed)

## 2023-11-22 ENCOUNTER — Other Ambulatory Visit: Payer: Self-pay | Admitting: *Deleted

## 2023-11-22 DIAGNOSIS — I5033 Acute on chronic diastolic (congestive) heart failure: Secondary | ICD-10-CM | POA: Diagnosis not present

## 2023-11-22 DIAGNOSIS — I4891 Unspecified atrial fibrillation: Secondary | ICD-10-CM | POA: Diagnosis not present

## 2023-11-22 DIAGNOSIS — J9601 Acute respiratory failure with hypoxia: Secondary | ICD-10-CM | POA: Diagnosis not present

## 2023-11-22 DIAGNOSIS — I272 Pulmonary hypertension, unspecified: Secondary | ICD-10-CM | POA: Diagnosis not present

## 2023-11-22 LAB — GLUCOSE, CAPILLARY
Glucose-Capillary: 141 mg/dL — ABNORMAL HIGH (ref 70–99)
Glucose-Capillary: 196 mg/dL — ABNORMAL HIGH (ref 70–99)
Glucose-Capillary: 203 mg/dL — ABNORMAL HIGH (ref 70–99)
Glucose-Capillary: 155 mg/dL — ABNORMAL HIGH (ref 70–99)

## 2023-11-22 LAB — CBC WITH DIFFERENTIAL/PLATELET
Abs Immature Granulocytes: 0.35 10*3/uL — ABNORMAL HIGH (ref 0.00–0.07)
Basophils Absolute: 0 10*3/uL (ref 0.0–0.1)
Basophils Relative: 0 %
Eosinophils Absolute: 0 10*3/uL (ref 0.0–0.5)
Eosinophils Relative: 0 %
HCT: 26.8 % — ABNORMAL LOW (ref 39.0–52.0)
Hemoglobin: 8.5 g/dL — ABNORMAL LOW (ref 13.0–17.0)
Immature Granulocytes: 2 %
Lymphocytes Relative: 7 %
Lymphs Abs: 1.1 10*3/uL (ref 0.7–4.0)
MCH: 28.3 pg (ref 26.0–34.0)
MCHC: 31.7 g/dL (ref 30.0–36.0)
MCV: 89.3 fL (ref 80.0–100.0)
Monocytes Absolute: 1.4 10*3/uL — ABNORMAL HIGH (ref 0.1–1.0)
Monocytes Relative: 9 %
Neutro Abs: 11.8 10*3/uL — ABNORMAL HIGH (ref 1.7–7.7)
Neutrophils Relative %: 82 %
Platelets: 340 10*3/uL (ref 150–400)
RBC: 3 MIL/uL — ABNORMAL LOW (ref 4.22–5.81)
RDW: 16.8 % — ABNORMAL HIGH (ref 11.5–15.5)
WBC: 14.6 10*3/uL — ABNORMAL HIGH (ref 4.0–10.5)
nRBC: 0 % (ref 0.0–0.2)

## 2023-11-22 LAB — MAGNESIUM: Magnesium: 2.8 mg/dL — ABNORMAL HIGH (ref 1.7–2.4)

## 2023-11-22 LAB — PROTIME-INR
INR: 2 — ABNORMAL HIGH (ref 0.8–1.2)
Prothrombin Time: 22.4 s — ABNORMAL HIGH (ref 11.4–15.2)

## 2023-11-22 MED ORDER — POTASSIUM CHLORIDE CRYS ER 20 MEQ PO TBCR
40.0000 meq | EXTENDED_RELEASE_TABLET | Freq: Four times a day (QID) | ORAL | Status: AC
Start: 2023-11-22 — End: 2023-11-22
  Administered 2023-11-22 (×2): 40 meq via ORAL
  Filled 2023-11-22 (×2): qty 2

## 2023-11-22 MED ORDER — WARFARIN SODIUM 5 MG PO TABS
5.0000 mg | ORAL_TABLET | Freq: Once | ORAL | Status: AC
  Administered 2023-11-22: 5 mg via ORAL
  Filled 2023-11-22: qty 1

## 2023-11-22 MED ORDER — METOLAZONE 5 MG PO TABS
5.0000 mg | ORAL_TABLET | Freq: Once | ORAL | Status: AC
Start: 2023-11-22 — End: 2023-11-22
  Administered 2023-11-22: 5 mg via ORAL
  Filled 2023-11-22: qty 1

## 2023-11-22 NOTE — Progress Notes (Signed)
PHARMACY - ANTICOAGULATION CONSULT NOTE  Pharmacy Consult for warfarin Indication: atrial fibrillation  Allergies  Allergen Reactions   Other Rash    Bandaids    Patient Measurements: Height: 5\' 7"  (170.2 cm) Weight: 74.5 kg (164 lb 3.9 oz) IBW/kg (Calculated) : 66.1  Vital Signs: Temp: 98.1 F (36.7 C) (01/31 0428) Temp Source: Oral (01/31 0428) BP: 145/44 (01/31 0428) Pulse Rate: 44 (01/31 0428)  Labs: Recent Labs    11/20/23 0436 11/21/23 0439 11/22/23 0354  HGB 8.6* 8.5* 8.5*  HCT 26.7* 26.9* 26.8*  PLT 327 333 340  LABPROT 32.7* 28.9* 22.4*  INR 3.2* 2.7* 2.0*  CREATININE 2.19* 2.10* 1.99*    Estimated Creatinine Clearance: 26.8 mL/min (A) (by C-G formula based on SCr of 1.99 mg/dL (H)).   Medical History: Past Medical History:  Diagnosis Date   (HFpEF) heart failure with preserved ejection fraction (HCC)    Arthritis    Atrial fibrillation (HCC)    Bradycardia    Carotid artery disease (HCC)    Hyperlipidemia    Hypertension    Pulmonary hypertension (HCC)    Type 2 diabetes mellitus (HCC)     Assessment: 83 year old male with known afib on chronic warfarin therapy. INR elevated initially at 7.2. Hemoglobin trending down 10.2>9.2>8.5, no bleeding issues noted.   Home dose listed as 7.5 mg on Mon + Fri and 5 mg ROW.   INR  3.2> 2.7> 2  , therapeutic.   Will likely need reduced dosing than prior home regimen based on current admission  1/26: 5 mg 1/27: 5 mg 1/28: 2.5 mg 1/29: hold dose  Goal of Therapy:  INR 2-3 Monitor platelets by anticoagulation protocol: Yes   Plan:  Warfarin 5 mg x 1 dose. Daily INR  [name], BS Pharm D, BCPS Clinical Pharmacist 11/22/2023 12:32 PM

## 2023-11-22 NOTE — Plan of Care (Signed)
   Problem: Activity: Goal: Risk for activity intolerance will decrease Outcome: Progressing   Problem: Coping: Goal: Level of anxiety will decrease Outcome: Progressing   Problem: Pain Managment: Goal: General experience of comfort will improve and/or be controlled Outcome: Progressing

## 2023-11-22 NOTE — Plan of Care (Signed)
   Problem: Education: Goal: Knowledge of General Education information will improve Description Including pain rating scale, medication(s)/side effects and non-pharmacologic comfort measures Outcome: Progressing   Problem: Health Behavior/Discharge Planning: Goal: Ability to manage health-related needs will improve Outcome: Progressing

## 2023-11-22 NOTE — Plan of Care (Signed)

## 2023-11-22 NOTE — Consult Note (Signed)
[facility] Liaison Note  11/22/2023  Robert Baldwin [birth year] [id number]  Location: RN Hospital Liaison screened the patient remotely at [facility].  Insurance: [facility]   Robert Baldwin is a 83 y.o. male who is a Primary Care Patient of Kirstie Peri, MD- [facility] Internal Medicine. The patient was screened for  readmission hospitalization with noted medium risk score for unplanned readmission risk with 1 IP in 6 months.  The patient was assessed for potential Care Management service needs for post hospital transition for care coordination. Review of patient's electronic medical record reveals patient was admitted with Acute Hypoxemic respiratory failure. Currently recommendation for HHPT. Liaison with pt's spouse (Tammy) and educated on VBCI services for a post hospital prevention readmission follow up call. Spouse receptive to [employer] services. Liaison made a referral for services for post hospital discharge.  Plan: [facility] Liaison will continue to follow progress and disposition to asess for post hospital community care coordination/management needs.  Referral request for community care coordination: Liaison made a referral for VBCI to intervene for care management services post discharged.   VBCI Care Management/Population Health does not replace or interfere with any arrangements made by the Inpatient Transition of Care team.   For questions contact:   Elliot Cousin, RN, [facility] Liaison [place]   [facility], Population Health Office Hours MTWF  8:00 am-6:00 pm Direct Dial: [phone] mobile [phone] [Office toll free line] Office Hours are M-F 8:30 - 5 pm [email or web address]

## 2023-11-22 NOTE — Progress Notes (Signed)
PROGRESS NOTE   Robert SHAWN  Baldwin:[id number] DOB: Mar 08, 1941 DOA: 11/13/2023 PCP: Kirstie Peri, MD   Chief Complaint  Patient presents with   hypoxia   Level of care: Telemetry  Brief Admission History:  83 y.o. male with medical history significant for type 2 diabetes, hypertension, atrial fibrillation on Coumadin, dyslipidemia, HFpEF, pulmonary hypertension, and CKD stage IIIb-IV, who presented to the ED with worsening cough and shortness of breath along with generalized weakness.  Patient was admitted with acute hypoxemic respiratory failure secondary to multifocal community-acquired pneumonia and was started on empiric antibiotics.  He has now completed course of antibiotics and is currently experiencing acute HFpEF with RV dysfunction and has been started on aggressive IV Lasix for diuresis.  Cardiology consulted for assistance in management.     Assessment and Plan:  Acute hypoxemic respiratory failure persisting in the setting of acute HFpEF with RV dysfunction -completed azithromycin and Rocephin  -Monitor cultures: no growth to date  -Wean oxygen as tolerated -Flu COVID and RSV negative -Blood cultures negative -Continue scheduled breathing treatments and Mucinex -Continue IV Solu-Medrol with plan to de-escalate over next day to oral prednisone -Up in chair and incentive spirometry encouraged -Chest x-ray 1/24 with cardiomegaly and pulmonary edema now on diuresis with IV Lasix 80 mg 3 times daily and metolazone added on 11/21/2023 by cardiology team x 1 dose -Appreciate cardiology assistance with 2D echocardiogram ordered-noted below LVEF 65-70% indeterminate diastolic parameters, Right ventricular systolic function is moderately reduced. The right ventricular size is moderately enlarged. There is severely elevated pulmonary artery systolic pressure , mild aortic valve stenosis Filed Weights   11/20/23 0501 11/21/23 0305 11/22/23 0500  Weight: 76.6 kg 76.5 kg 74.5 kg     Intake/Output Summary (Last 24 hours) at 11/22/2023 1400 Last data filed at 11/22/2023 1100 Gross per 24 hour  Intake 480 ml  Output 3350 ml  Net -2870 ml   Multifocal CAP -Completed treatment with azithromycin and Rocephin for 5 days -weaning steroids to complete course    Transaminitis with alkaline phosphatase elevation-resolved -Acute hepatitis panel negative -Ultrasound abdomen without any significant findings -atorvastatin resumed   Normocytic Anemia -No overt bleeding noted, continue to follow trend with stabilization noted -INR improving to 3.2, continue to monitor   HFpEF with RV dysfunction -2D echocardiogram 04/2022 EF 70-75% -Continue tadalafil and Opsumit -Farxiga -Repeat 2D echocardiogram as noted above by cardiology -remains volume overloaded but has been diuresing well on IV furosemide -less short of breath   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 07/2022 -resumed atorvastatin   Type 2 diabetes with hyperglycemia Uncontrolled with neurological complications -Continue SGLT2i -Continue carb modified diet -Added SSI -added semglee and prandial novolog coverage -steroid induced hyperglycemia - weaning steroid CBG (last 3)  Recent Labs    11/21/23 2200 11/22/23 0744 11/22/23 1141  GLUCAP 196* 141* 196*   Permanent atrial fibrillation -Bradycardic and not on AV nodal blockade agents -Currently on Coumadin with supratherapeutic INR -Hold Coumadin for now, appreciate pharmacy assistance -Vitamin K given 1/24   OSA on CPAP -Continue CPAP at night   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    Objective: Vitals:   11/21/23 2220 11/22/23 0428 11/22/23 0500 11/22/23 0839  BP: (!) 141/36 (!) 145/44    Pulse: (!) 40 (!) 44    Resp:  18    Temp: 98.2 F (36.8 C) 98.1 F (36.7 C)    TempSrc: Oral Oral    SpO2: 96% 96%  96%  Weight:   74.5 kg   Height:        Intake/Output Summary (Last 24 hours) at 11/22/2023 1400 Last data filed at 11/22/2023 1100 Gross per 24 hour  Intake 480 ml  Output 3350 ml  Net -2870 ml   Filed Weights   11/20/23 0501 11/21/23 0305 11/22/23 0500  Weight: 76.6 kg 76.5 kg 74.5 kg   Examination:  General exam: Appears calm and comfortable  Respiratory system: Clear to auscultation.  No crackles heard.  Respiratory effort normal. Cardiovascular system: normal S1 & S2 heard. No JVD, murmurs, rubs, gallops or clicks. No pedal edema. Gastrointestinal system: Abdomen is nondistended, soft and nontender. No organomegaly or masses felt. Normal bowel sounds heard. Central nervous system: Alert and oriented. No focal neurological deficits. Extremities: 1+ edema BLEs pitting Symmetric 5 x 5 power. Skin: No rashes, lesions or ulcers. Psychiatry: Judgement and insight appear normal. Mood & affect appropriate.   Data Reviewed: I have personally reviewed following labs and imaging studies  CBC: Recent Labs  Lab 11/18/23 0407 11/19/23 0509 11/20/23 0436 11/21/23 0439 11/22/23 0354  WBC 20.1* 16.4* 16.0* 16.6* 14.6*  NEUTROABS  --  14.3*  --  14.9* 11.8*  HGB 8.3* 8.5* 8.6* 8.5* 8.5*  HCT 25.6* 26.9* 26.7* 26.9* 26.8*  MCV 89.2 88.5 88.4 89.1 89.3  PLT 318 334 327 333 340    Basic Metabolic Panel: Recent Labs  Lab 11/18/23 0407 11/19/23 0509 11/20/23 0436 11/21/23 0439 11/22/23 0354  NA 134* 136 136 136 137  K 4.3 4.5 4.2 4.0 3.4*  CL 96* 96* 97* 96* 93*  CO2 25 27 29 29  33*   GLUCOSE 280* 290* 280* 164* 182*  BUN 123* 122* 121* 116* 116,116*  CREATININE 2.58* 2.39* 2.19* 2.10* 1.99*  CALCIUM 8.0* 8.0* 8.0* 8.0* 8.3*  MG 2.8* 2.7* 2.7* 2.6* 2.8*    CBG: Recent Labs  Lab 11/21/23 1110 11/21/23 1624 11/21/23 2200 11/22/23 0744 11/22/23 1141  GLUCAP 207* 148* 196* 141* 196*    Recent Results (from the past 240 hours)  Resp panel by RT-PCR (RSV, Flu A&B, Covid) Anterior Nasal Swab     Status: None   Collection Time: 11/13/23 10:25 AM   Specimen: Anterior Nasal Swab  Result Value Ref Range Status   SARS Coronavirus 2 by RT PCR NEGATIVE NEGATIVE Final    Comment: (NOTE) SARS-CoV-2 target nucleic acids are NOT DETECTED.  The SARS-CoV-2 RNA is generally detectable in upper respiratory specimens during the acute phase of infection. The lowest concentration of SARS-CoV-2 viral copies this assay can detect is 138 copies/mL. A negative result does not preclude SARS-Cov-2 infection and should not be used as the sole basis for treatment or other patient management decisions. A negative result may occur with  improper specimen collection/handling, submission of specimen other than nasopharyngeal swab, presence of viral mutation(s) within the areas targeted by this assay, and inadequate number of viral copies(<138 copies/mL). A negative result must be combined with clinical observations, patient history, and epidemiological information. The expected result is Negative.  Fact Sheet for Patients:  BloggerCourse.com  Fact Sheet for Healthcare Providers:  SeriousBroker.it  This test is no t yet approved or cleared by the [place] FDA and  has been authorized for detection and/or diagnosis of SARS-CoV-2 by FDA under an Emergency Use Authorization (EUA). This EUA will remain  in effect (meaning this test can be used) for the duration of the COVID-19 declaration under Section 564(b)(1) of the Act,  21 U.S.C.section 360bbb-3(b)(1), unless the authorization is terminated  or revoked sooner.       Influenza A by PCR NEGATIVE NEGATIVE Final   Influenza B by PCR NEGATIVE NEGATIVE Final    Comment: (NOTE) The Xpert Xpress SARS-CoV-2/FLU/RSV plus assay is intended as an aid in the diagnosis of influenza from Nasopharyngeal swab specimens and should not be used as a sole basis for treatment. Nasal washings and aspirates are unacceptable for Xpert Xpress SARS-CoV-2/FLU/RSV testing.  Fact Sheet for Patients: BloggerCourse.com  Fact Sheet for Healthcare Providers: SeriousBroker.it  This test is not yet approved or cleared by the [place] FDA and has been authorized for detection and/or diagnosis of SARS-CoV-2 by FDA under an Emergency Use Authorization (EUA). This EUA will remain in effect (meaning this test can be used) for the duration of the COVID-19 declaration under Section 564(b)(1) of the Act, 21 U.S.C. section 360bbb-3(b)(1), unless the authorization is terminated or revoked.     Resp Syncytial Virus by PCR NEGATIVE NEGATIVE Final    Comment: (NOTE) Fact Sheet for Patients: BloggerCourse.com  Fact Sheet for Healthcare Providers: SeriousBroker.it  This test is not yet approved or cleared by the [place] FDA and has been authorized for detection and/or diagnosis of SARS-CoV-2 by FDA under an Emergency Use Authorization (EUA). This EUA will remain in effect (meaning this test can be used) for the duration of the COVID-19 declaration under Section 564(b)(1) of the Act, 21 U.S.C. section 360bbb-3(b)(1), unless the authorization is terminated or revoked.  Performed at [facility], [address], [place], [place] [address]   Blood culture (routine x 2)     Status: None   Collection Time: 11/13/23  1:05 PM   Specimen: BLOOD  Result Value Ref Range Status    Specimen Description BLOOD BLOOD LEFT HAND  Final   Special Requests   Final    Blood Culture results may not be optimal due to an inadequate volume of blood received in culture bottles BOTTLES DRAWN AEROBIC AND ANAEROBIC   Culture   Final    NO GROWTH 5 DAYS Performed at [facility], [address], [place], [place] [address]    Report Status 11/18/2023 FINAL  Final  Blood culture (routine x 2)     Status: None   Collection Time: 11/13/23  1:05 PM   Specimen: BLOOD  Result Value Ref Range Status   Specimen Description BLOOD BLOOD RIGHT HAND  Final   Special Requests   Final    Blood Culture results may not be optimal due to an inadequate volume of blood received in culture bottles BOTTLES DRAWN AEROBIC ONLY   Culture   Final    NO GROWTH 5 DAYS Performed at [facility], [address], [place], [place] [address]    Report Status 11/18/2023 FINAL  Final    Radiology Studies: No results found.  Scheduled Meds:  atorvastatin  40 mg Oral QHS   dapagliflozin propanediol  10 mg Oral Daily   dextromethorphan-guaiFENesin  1 tablet Oral BID   furosemide  80 mg  Intravenous TID   hydrALAZINE  25 mg Oral BID   insulin aspart  0-5 Units Subcutaneous QHS   insulin aspart  0-9 Units Subcutaneous TID WC   insulin aspart  6 Units Subcutaneous TID WC   insulin glargine-yfgn  14 Units Subcutaneous Daily   ipratropium-albuterol  3 mL Nebulization BID   macitentan  10 mg Oral Daily   metolazone  5 mg Oral Once   polyethylene glycol  17 g Oral Daily   potassium chloride  40 mEq Oral Q6H   predniSONE  20 mg Oral Q breakfast   tadalafil  20 mg Oral BID   warfarin  5 mg Oral ONCE-1600   Warfarin - Pharmacist Dosing Inpatient   Does not apply q1600   Continuous Infusions:   LOS: 9 days   Time spent: 40 mins  Murna Backer Laural Benes, MD How to contact the [facility] Attending or Consulting provider 7A - 7P or covering provider during after hours 7P -7A, for this patient?  Check the care team in [facility] and  look for a) attending/consulting TRH provider listed and b) the [facility] team listed Log into [email or web address] to find provider on call.  Locate the [facility] provider you are looking for under [facility] and page to a number that you can be directly reached. If you still have difficulty reaching the provider, please page the [facility] (Director on Call) for the Hospitalists listed on amion for assistance.  11/22/2023, 2:00 PM

## 2023-11-22 NOTE — Progress Notes (Signed)
Titrated [place] to 1lpm from 2lpm [place]. SPO2 was 96-97% on 2lpm. Patient doesn't wear any oxygen at home.

## 2023-11-22 NOTE — Progress Notes (Signed)
Rounding Note    Patient Name: Robert Baldwin Date of Encounter: 11/22/2023  [facility] Cardiologist: [name], MD   Subjective   SOB improving  Inpatient Medications    Scheduled Meds:  atorvastatin  40 mg Oral QHS   dapagliflozin propanediol  10 mg Oral Daily   dextromethorphan-guaiFENesin  1 tablet Oral BID   furosemide  80 mg Intravenous TID   hydrALAZINE  25 mg Oral BID   insulin aspart  0-5 Units Subcutaneous QHS   insulin aspart  0-9 Units Subcutaneous TID WC   insulin aspart  6 Units Subcutaneous TID WC   insulin glargine-yfgn  14 Units Subcutaneous Daily   ipratropium-albuterol  3 mL Nebulization BID   macitentan  10 mg Oral Daily   polyethylene glycol  17 g Oral Daily   predniSONE  20 mg Oral Q breakfast   tadalafil  20 mg Oral BID   Warfarin - Pharmacist Dosing Inpatient   Does not apply q1600   Continuous Infusions:  PRN Meds: acetaminophen **OR** acetaminophen, albuterol, ondansetron **OR** ondansetron (ZOFRAN) IV, mouth rinse   Vital Signs    Vitals:   11/21/23 1958 11/21/23 2220 11/22/23 0428 11/22/23 0500  BP:  (!) 141/36 (!) 145/44   Pulse:  (!) 40 (!) 44   Resp:   18   Temp:  98.2 F (36.8 C) 98.1 F (36.7 C)   TempSrc:  Oral Oral   SpO2: 90% 96% 96%   Weight:    74.5 kg  Height:        Intake/Output Summary (Last 24 hours) at 11/22/2023 0826 Last data filed at 11/22/2023 1610 Gross per 24 hour  Intake 720 ml  Output 3075 ml  Net -2355 ml      11/22/2023    5:00 AM 11/21/2023    3:05 AM 11/20/2023    5:01 AM  Last 3 Weights  Weight (lbs) 164 lb 3.9 oz 168 lb 10.4 oz 168 lb 14 oz  Weight (kg) 74.5 kg 76.5 kg 76.6 kg      Telemetry    Afib slow VR - Personally Reviewed  ECG    N/a - Personally Reviewed  Physical Exam   GEN: No acute distress.   Neck: + JVD Cardiac: irreg, 2/6 systolic murmur rusb and apex Respiratory: Clear to auscultation bilaterally. GI: Soft, nontender, non-distended  MS: 1+ bilateral  LE edema Neuro:  Nonfocal  Psych: Normal affect   Labs    High Sensitivity Troponin:   Recent Labs  Lab 11/13/23 1050 11/13/23 1305  TROPONINIHS 80* 88*     Chemistry Recent Labs  Lab 11/16/23 0506 11/17/23 0416 11/18/23 0407 11/19/23 0509 11/20/23 0436 11/21/23 0439 11/22/23 0354  NA 136 134*   < > 136 136 136 137  K 4.6 4.5   < > 4.5 4.2 4.0 3.4*  CL 100 97*   < > 96* 97* 96* 93*  CO2 23 24   < > 27 29 29  33*  GLUCOSE 273* 273*   < > 290* 280* 164* 182*  BUN 101* 117*   < > 122* 121* 116* 116,116*  CREATININE 2.61* 2.78*   < > 2.39* 2.19* 2.10* 1.99*  CALCIUM 8.1* 7.9*   < > 8.0* 8.0* 8.0* 8.3*  MG 2.7* 2.8*   < > 2.7* 2.7* 2.6*  --   PROT 6.6 6.3*  --   --   --   --   --   ALBUMIN 2.3* 2.3*  --   --   --   --   --  AST 51* 37  --   --   --   --   --   ALT 47* 41  --   --   --   --   --   ALKPHOS 185* 158*  --   --   --   --   --   BILITOT 0.9 0.8  --   --   --   --   --   GFRNONAA 24* 22*   < > 26* 29* 31* 33*  ANIONGAP 13 13   < > 13 10 11 11    < > = values in this interval not displayed.    Lipids No results for input(s): "CHOL", "TRIG", "HDL", "LABVLDL", "LDLCALC", "CHOLHDL" in the last 168 hours.  Hematology Recent Labs  Lab 11/20/23 0436 11/21/23 0439 11/22/23 0354  WBC 16.0* 16.6* 14.6*  RBC 3.02* 3.02* 3.00*  HGB 8.6* 8.5* 8.5*  HCT 26.7* 26.9* 26.8*  MCV 88.4 89.1 89.3  MCH 28.5 28.1 28.3  MCHC 32.2 31.6 31.7  RDW 16.3* 16.4* 16.8*  PLT 327 333 340   Thyroid No results for input(s): "TSH", "FREET4" in the last 168 hours.  BNP Recent Labs  Lab 11/15/23 1810  BNP 586.0*    DDimer No results for input(s): "DDIMER" in the last 168 hours.   Radiology    No results found.  Cardiac Studies     Patient Profile     Robert Baldwin is a 83 y.o. 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 [name].   Assessment & Plan    1.Acute on chronic HFpEF/Pulmonary HTN/RV dysfunction - 04/2023 echo: LVEF 60-65%, no MAs, indet diastolic dysfunction. D shaped septum, mild RV dysfunction, severe pulm HTN PASP 78 - 04/2023 RHC: mean PA 44, PCWP 17, CI 4.1, PVR 3.5 woods units - Jan 2025 echo: LVE 65-70%, no WMAs, indet diastolic, mod RV enlargement and moderate dysfunction, severe pulm HTN, severe BAE, mild MS, mild AS mean grad 11, dilated fixed IVC   - followed in HF clinic for combined pre and postcapillary pulmonary HTN. Extensive workup without clear precapillary etiology, suspicion of possible group I, he has been on macitentan and tadalafil. Postcapillary hemodynamics are related to his HFpEF.    - admitted SOB primarily related to multifocal pneumonia, has had some signs of fluid overload as well.  BNP 816-->586, CT primarily pneumonia   - negative 2.3 L yesterday, negative 3.9L since admission though I/Os are incomplete. By weight down 4 lbs from yesterday . He is on IV lasix 80 tid and received oral metolazone 5mg  x 1 yesterday,  downtrend in Cr with diuresis consistent with venous congestion and HF.   - very good diuresis yesterday, remains fluid overloaded. Continue IV lasix 80mg  tid, redose metolazone 5mg  x 1 later in the day so he can get his oral KCl onboard.        2.Afib with chronic slow ventricular response - chronic slow rates from chart review, at times 30s to 40s. In absence of symptoms has been just being monitored.  - off av nodal agents.  - he is on coumadin     3.Multifocal pneumonia - CT chest:  extensive patchy consolidative opacities bilateral, consistent with multifocal pneumonia - WBC in the 30s on admission, resolving.    4.Anemia - per primary team  For questions or updates, please contact [facility] Please consult [email or web address] for contact info under        Signed, Dina Rich, MD  11/22/2023, 8:26 AM

## 2023-11-22 NOTE — Care Management Important Message (Signed)
Important Message  Patient Details  Name: Robert Baldwin MRN: [id number] Date of Birth: 1941/05/11   Important Message Given:  Yes - Medicare IM     Corey Harold 11/22/2023, 11:39 AM

## 2023-11-23 DIAGNOSIS — I272 Pulmonary hypertension, unspecified: Secondary | ICD-10-CM | POA: Diagnosis not present

## 2023-11-23 DIAGNOSIS — J9601 Acute respiratory failure with hypoxia: Secondary | ICD-10-CM | POA: Diagnosis not present

## 2023-11-23 DIAGNOSIS — I4891 Unspecified atrial fibrillation: Secondary | ICD-10-CM | POA: Diagnosis not present

## 2023-11-23 LAB — CBC WITH DIFFERENTIAL/PLATELET
Abs Immature Granulocytes: 0.23 10*3/uL — ABNORMAL HIGH (ref 0.00–0.07)
Basophils Absolute: 0 10*3/uL (ref 0.0–0.1)
Basophils Relative: 0 %
Eosinophils Absolute: 0.1 10*3/uL (ref 0.0–0.5)
Eosinophils Relative: 1 %
HCT: 30.9 % — ABNORMAL LOW (ref 39.0–52.0)
Hemoglobin: 9.7 g/dL — ABNORMAL LOW (ref 13.0–17.0)
Immature Granulocytes: 2 %
Lymphocytes Relative: 8 %
Lymphs Abs: 1.2 10*3/uL (ref 0.7–4.0)
MCH: 28.2 pg (ref 26.0–34.0)
MCHC: 31.4 g/dL (ref 30.0–36.0)
MCV: 89.8 fL (ref 80.0–100.0)
Monocytes Absolute: 1.4 10*3/uL — ABNORMAL HIGH (ref 0.1–1.0)
Monocytes Relative: 9 %
Neutro Abs: 12.1 10*3/uL — ABNORMAL HIGH (ref 1.7–7.7)
Neutrophils Relative %: 80 %
Platelets: 369 10*3/uL (ref 150–400)
RBC: 3.44 MIL/uL — ABNORMAL LOW (ref 4.22–5.81)
RDW: 17.2 % — ABNORMAL HIGH (ref 11.5–15.5)
WBC: 15 10*3/uL — ABNORMAL HIGH (ref 4.0–10.5)
nRBC: 0 % (ref 0.0–0.2)

## 2023-11-23 LAB — GLUCOSE, CAPILLARY
Glucose-Capillary: 112 mg/dL — ABNORMAL HIGH (ref 70–99)
Glucose-Capillary: 246 mg/dL — ABNORMAL HIGH (ref 70–99)
Glucose-Capillary: 105 mg/dL — ABNORMAL HIGH (ref 70–99)
Glucose-Capillary: 222 mg/dL — ABNORMAL HIGH (ref 70–99)

## 2023-11-23 LAB — BASIC METABOLIC PANEL
Anion gap: 9 (ref 5–15)
BUN: 118 mg/dL — ABNORMAL HIGH (ref 8–23)
CO2: 37 mmol/L — ABNORMAL HIGH (ref 22–32)
Calcium: 8.2 mg/dL — ABNORMAL LOW (ref 8.9–10.3)
Chloride: 90 mmol/L — ABNORMAL LOW (ref 98–111)
Creatinine, Ser: 1.76 mg/dL — ABNORMAL HIGH (ref 0.61–1.24)
GFR, Estimated: 38 mL/min — ABNORMAL LOW (ref >60)
Glucose, Bld: 89 mg/dL (ref 70–99)
Potassium: 4.1 mmol/L (ref 3.5–5.1)
Sodium: 136 mmol/L (ref 135–145)

## 2023-11-23 LAB — MAGNESIUM: Magnesium: 2.7 mg/dL — ABNORMAL HIGH (ref 1.7–2.4)

## 2023-11-23 LAB — PROTIME-INR
INR: 1.5 — ABNORMAL HIGH (ref 0.8–1.2)
Prothrombin Time: 18.2 s — ABNORMAL HIGH (ref 11.4–15.2)

## 2023-11-23 MED ORDER — FUROSEMIDE 10 MG/ML IJ SOLN
80.0000 mg | Freq: Every day | INTRAMUSCULAR | Status: DC
  Administered 2023-11-25 – 2023-11-27 (×3): 80 mg via INTRAVENOUS
  Filled 2023-11-23 (×4): qty 8

## 2023-11-23 MED ORDER — WARFARIN SODIUM 5 MG PO TABS
5.0000 mg | ORAL_TABLET | Freq: Once | ORAL | Status: AC
  Administered 2023-11-23: 5 mg via ORAL
  Filled 2023-11-23: qty 1

## 2023-11-23 NOTE — Progress Notes (Signed)
 Bipap is PRN order.  Not needed at this time.

## 2023-11-23 NOTE — Progress Notes (Signed)
   11/23/23 2219  Assess: MEWS Score  Temp (!) 97.5 F (36.4 C)  BP (!) 138/42  MAP (mmHg) 71  Pulse Rate (!) 37  Resp 18  SpO2 97 %  O2 Device Nasal Cannula  Assess: MEWS Score  MEWS Temp 0  MEWS Systolic 0  MEWS Pulse 2  MEWS RR 0  MEWS LOC 0  MEWS Score 2  MEWS Score Color Yellow  Assess: if the MEWS score is Yellow or Red  Were vital signs accurate and taken at a resting state? Yes  Does the patient meet 2 or more of the SIRS criteria? No  MEWS guidelines implemented  Yes, yellow  Treat  MEWS Interventions Considered administering scheduled or prn medications/treatments as ordered  Take Vital Signs  Increase Vital Sign Frequency  Yellow: Q2hr x1, continue Q4hrs until patient remains green for 12hrs  Escalate  MEWS: Escalate Yellow: Discuss with charge nurse and consider notifying provider and/or RRT  Notify: Charge Nurse/RN  Name of Charge Nurse/RN Notified Revonda Standard, RN  Assess: SIRS CRITERIA  SIRS Temperature  0  SIRS Respirations  0  SIRS Pulse 0  SIRS WBC 0  SIRS Score Sum  0

## 2023-11-23 NOTE — Progress Notes (Signed)
PROGRESS NOTE   Robert Baldwin  ZOX:[id number] DOB: [birth year] DOA: 11/13/2023 PCP: Kirstie Peri, MD   Chief Complaint  Patient presents with   hypoxia   Level of care: Telemetry  Brief Admission History:  83 y.o. male with medical history significant for type 2 diabetes, hypertension, atrial fibrillation on Coumadin, dyslipidemia, HFpEF, pulmonary hypertension, and CKD stage IIIb-IV, who presented to the ED with worsening cough and shortness of breath along with generalized weakness.  Patient was admitted with acute hypoxemic respiratory failure secondary to multifocal community-acquired pneumonia and was started on empiric antibiotics.  He has now completed course of antibiotics and is currently experiencing acute HFpEF with RV dysfunction and has been started on aggressive IV Lasix for diuresis.  Cardiology consulted for assistance in management.     Assessment and Plan:  Acute hypoxemic respiratory failure persisting in the setting of acute HFpEF with RV dysfunction -completed azithromycin and Rocephin  -Monitor cultures: no growth to date  -Wean oxygen as tolerated -Flu COVID and RSV negative -Blood cultures negative -Continue scheduled breathing treatments and Mucinex -Continue IV Solu-Medrol with plan to de-escalate over next day to oral prednisone -Up in chair and incentive spirometry encouraged -Chest x-ray 1/24 with cardiomegaly and pulmonary edema now on diuresis with IV Lasix 80 mg 3 times daily and metolazone added on 11/21/2023 by cardiology team x 1 dose -Appreciate cardiology assistance with 2D echocardiogram ordered-noted below LVEF 65-70% indeterminate diastolic parameters, Right ventricular systolic function is moderately reduced. The right ventricular size is moderately enlarged. There is severely elevated pulmonary artery systolic pressure , mild aortic valve stenosis Filed Weights   11/21/23 0305 11/22/23 0500 11/23/23 0412  Weight: 76.5 kg 74.5 kg 72.8 kg     Intake/Output Summary (Last 24 hours) at 11/23/2023 1350 Last data filed at 11/23/2023 1303 Gross per 24 hour  Intake 716 ml  Output 2750 ml  Net -2034 ml   Multifocal CAP -Completed treatment with azithromycin and Rocephin for 5 days -weaning steroids to complete course    Transaminitis with alkaline phosphatase elevation-resolved -Acute hepatitis panel negative -Ultrasound abdomen without any significant findings -atorvastatin resumed   Normocytic Anemia -No overt bleeding noted, continue to follow trend with stabilization noted -INR being monitored closely, continue to monitor   HFpEF with RV dysfunction -2D echocardiogram 04/2022 EF 70-75% -Continue tadalafil and Opsumit -Farxiga per cardio -Repeat 2D echocardiogram as noted above by cardiology -remains volume overloaded but has been diuresing well on IV furosemide -less short of breath   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 07/2022 -resumed atorvastatin   Type 2 diabetes with hyperglycemia Uncontrolled with neurological complications -Continue SGLT2i -Continue carb modified diet -Added SSI -added semglee and prandial novolog coverage -steroid induced hyperglycemia - weaning steroid CBG (last 3)  Recent Labs    11/22/23 2044 11/23/23 0705 11/23/23 1127  GLUCAP 155* 112* 246*   Permanent atrial fibrillation -Bradycardic and not on AV nodal blockade agents -Currently on Coumadin with supratherapeutic INR -Hold Coumadin for now, appreciate pharmacy assistance -Vitamin K given 1/24   OSA on CPAP -Continue CPAP at night   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    Objective: Vitals:   11/22/23 2038 11/22/23 2107 11/23/23 0412 11/23/23 1140  BP:  (!) 137/38 (!) 145/36 (!) 129/35  Pulse:  (!) 42 (!) 43 (!) 39  Resp:  20 16   Temp:  97.9 F (36.6 C) 97.7 F (36.5 C) 97.8 F (36.6 C)  TempSrc:  Oral Oral Oral  SpO2: 91% 95% 94% 96%  Weight:   72.8 kg   Height:        Intake/Output Summary (Last 24 hours) at 11/23/2023 1350 Last data filed at 11/23/2023 1303 Gross per 24 hour  Intake 716 ml  Output 2750 ml  Net -2034 ml   Filed Weights   11/21/23 0305 11/22/23 0500 11/23/23 0412  Weight: 76.5 kg 74.5 kg 72.8 kg   Examination:  General exam: Appears calm and comfortable  Respiratory system: Clear to auscultation.  No crackles heard.  Respiratory effort normal. Cardiovascular system: bradycardic rate; normal S1 & S2 heard. No JVD, murmurs, rubs, gallops or clicks. No pedal edema. Gastrointestinal system: Abdomen is nondistended, soft and nontender. No organomegaly or masses felt. Normal bowel sounds heard. Central nervous system: Alert and oriented. No focal neurological deficits. Extremities: trace pretibial edema; Symmetric 5 x 5 power. Skin: No rashes, lesions or ulcers. Psychiatry: Judgement and insight appear normal. Mood & affect appropriate.   Data Reviewed: I have personally reviewed following labs and imaging studies  CBC: Recent Labs  Lab 11/19/23 0509 11/20/23 0436 11/21/23 0439 11/22/23 0354 11/23/23 0448  WBC 16.4* 16.0* 16.6* 14.6* 15.0*  NEUTROABS 14.3*  --  14.9* 11.8* 12.1*  HGB 8.5* 8.6* 8.5* 8.5* 9.7*  HCT 26.9* 26.7* 26.9* 26.8* 30.9*  MCV 88.5 88.4 89.1 89.3 89.8  PLT 334 327 333 340 369    Basic Metabolic Panel: Recent Labs  Lab 11/19/23 0509 11/20/23 0436 11/21/23 0439 11/22/23 0354 11/23/23 0448  NA 136 136 136 137 136  K  4.5 4.2 4.0 3.4* 4.1  CL 96* 97* 96* 93* 90*  CO2 27 29 29  33* 37*  GLUCOSE 290* 280* 164* 182* 89  BUN 122* 121* 116* 116,116* 118*  CREATININE 2.39* 2.19* 2.10* 1.99* 1.76*  CALCIUM 8.0* 8.0* 8.0* 8.3* 8.2*  MG 2.7* 2.7* 2.6* 2.8* 2.7*    CBG: Recent Labs  Lab 11/22/23 1141 11/22/23 1639 11/22/23 2044 11/23/23 0705 11/23/23 1127  GLUCAP 196* 203* 155* 112* 246*    No results found for this or any previous visit (from the past 240 hours).   Radiology Studies: No results found.  Scheduled Meds:  atorvastatin  40 mg Oral QHS   dapagliflozin propanediol  10 mg Oral Daily   dextromethorphan-guaiFENesin  1 tablet Oral BID   furosemide  80 mg Intravenous TID   hydrALAZINE  25 mg Oral BID   insulin aspart  0-5 Units Subcutaneous QHS   insulin aspart  0-9 Units Subcutaneous TID WC   insulin aspart  6 Units Subcutaneous TID WC   insulin glargine-yfgn  14 Units Subcutaneous Daily   macitentan  10 mg Oral Daily   polyethylene glycol  17 g Oral Daily   predniSONE  20 mg Oral Q breakfast   tadalafil  20 mg Oral BID   warfarin  5 mg Oral ONCE-1600   Warfarin - Pharmacist Dosing Inpatient   Does not apply q1600   Continuous Infusions:   LOS: 10 days   Time spent: 55 mins  Standley Dakins, MD How to contact the [facility] Attending or Consulting provider 7A - 7P or covering provider during after hours 7P -7A, for this patient?  Check the care team in [facility] and look for a) attending/consulting TRH provider listed and b) the [facility] team listed Log into [email or web address] to find provider on call.  Locate the [facility] provider you are looking for under [facility] and page to a number that you can be directly reached. If you still have difficulty reaching the provider, please page the [facility] (Director on Call) for the Hospitalists listed on amion for assistance.  11/23/2023, 1:50 PM

## 2023-11-23 NOTE — Progress Notes (Signed)
PHARMACY - ANTICOAGULATION CONSULT NOTE  Pharmacy Consult for warfarin Indication: atrial fibrillation  Allergies  Allergen Reactions   Other Rash    Bandaids    Patient Measurements: Height: 5\' 7"  (170.2 cm) Weight: 72.8 kg (160 lb 7.9 oz) IBW/kg (Calculated) : 66.1  Vital Signs: Temp: 97.7 F (36.5 C) (02/01 0412) Temp Source: Oral (02/01 0412) BP: 145/36 (02/01 0412) Pulse Rate: 43 (02/01 0412)  Labs: Recent Labs    11/21/23 0439 11/22/23 0354 11/23/23 0448  HGB 8.5* 8.5* 9.7*  HCT 26.9* 26.8* 30.9*  PLT 333 340 369  LABPROT 28.9* 22.4* 18.2*  INR 2.7* 2.0* 1.5*  CREATININE 2.10* 1.99* 1.76*    Estimated Creatinine Clearance: 30.3 mL/min (A) (by C-G formula based on SCr of 1.76 mg/dL (H)).   Medical History: Past Medical History:  Diagnosis Date   (HFpEF) heart failure with preserved ejection fraction (HCC)    Arthritis    Atrial fibrillation (HCC)    Bradycardia    Carotid artery disease (HCC)    Hyperlipidemia    Hypertension    Pulmonary hypertension (HCC)    Type 2 diabetes mellitus (HCC)     Assessment: 83 year old male with known afib on chronic warfarin therapy. INR elevated initially at 7.2. Hemoglobin t10.2>9.2>8.5> 9.7, no bleeding issues noted.   Home dose listed as 7.5 mg on Mon + Fri and 5 mg ROW.   INR  3.2> 2.7> 2> 1.5, subtherapeutic.   1/26: 5 mg   1/30 2.5mg  1/27: 5 mg   1/31 5mg  1/28: 2.5 mg 1/29: hold dose  Goal of Therapy:  INR 2-3 Monitor platelets by anticoagulation protocol: Yes   Plan:  Warfarin 5 mg x 1 dose. Daily INR  [name], BS Pharm D, BCPS Clinical Pharmacist 11/23/2023 9:04 AM

## 2023-11-23 NOTE — Progress Notes (Signed)
   11/23/23 1103  Assess: MEWS Score  ECG Heart Rate (!) 38 (apical)  Assess: MEWS Score  MEWS Temp 0  MEWS Systolic 0  MEWS Pulse 2  MEWS RR 0  MEWS LOC 0  MEWS Score 2  MEWS Score Color Yellow  Assess: if the MEWS score is Yellow or Red  Were vital signs accurate and taken at a resting state? Yes  Notify: Charge Nurse/RN  Name of Charge Nurse/RN Notified Brandi RN  Provider Notification  Provider Name/Title Laural Benes  Date Provider Notified 11/23/23  Time Provider Notified 1100  Method of Notification Page (secure chat)  Notification Reason Other (Comment) (afib bradycardia apical pulse 38)  Provider response  (hold lasix)  Date of Provider Response 11/23/23  Time of Provider Response 1100  Assess: SIRS CRITERIA  SIRS Temperature  0  SIRS Respirations  0  SIRS Pulse 0  SIRS WBC 1  SIRS Score Sum  1   Pt apical HR 38 MD informed. Pt denies chest pain or sob at this present time. Pt states he is just feeling very tired. Wife at bedside.

## 2023-11-23 NOTE — Progress Notes (Signed)
   11/23/23 2017  Assess: MEWS Score  Temp 97.7 F (36.5 C)  BP (!) 139/36  MAP (mmHg) 67  Pulse Rate (!) 37  Resp 18  SpO2 95 %  O2 Device Nasal Cannula  O2 Flow Rate (L/min) 1 L/min  Assess: MEWS Score  MEWS Temp 0  MEWS Systolic 0  MEWS Pulse 2  MEWS RR 0  MEWS LOC 0  MEWS Score 2  MEWS Score Color Yellow  Assess: if the MEWS score is Yellow or Red  Were vital signs accurate and taken at a resting state? Yes  Does the patient meet 2 or more of the SIRS criteria? No  MEWS guidelines implemented  Yes, yellow  Treat  MEWS Interventions Considered administering scheduled or prn medications/treatments as ordered  Take Vital Signs  Increase Vital Sign Frequency  Yellow: Q2hr x1, continue Q4hrs until patient remains green for 12hrs  Escalate  MEWS: Escalate Yellow: Discuss with charge nurse and consider notifying provider and/or RRT  Notify: Charge Nurse/RN  Name of Charge Nurse/RN Notified Revonda Standard, RN  Assess: SIRS CRITERIA  SIRS Temperature  0  SIRS Respirations  0  SIRS Pulse 0  SIRS WBC 0  SIRS Score Sum  0

## 2023-11-24 DIAGNOSIS — I272 Pulmonary hypertension, unspecified: Secondary | ICD-10-CM | POA: Diagnosis not present

## 2023-11-24 DIAGNOSIS — J9601 Acute respiratory failure with hypoxia: Secondary | ICD-10-CM | POA: Diagnosis not present

## 2023-11-24 DIAGNOSIS — I4891 Unspecified atrial fibrillation: Secondary | ICD-10-CM | POA: Diagnosis not present

## 2023-11-24 LAB — BASIC METABOLIC PANEL
Anion gap: 12 (ref 5–15)
BUN: 110 mg/dL — ABNORMAL HIGH (ref 8–23)
CO2: 36 mmol/L — ABNORMAL HIGH (ref 22–32)
Calcium: 8.3 mg/dL — ABNORMAL LOW (ref 8.9–10.3)
Chloride: 89 mmol/L — ABNORMAL LOW (ref 98–111)
Creatinine, Ser: 1.75 mg/dL — ABNORMAL HIGH (ref 0.61–1.24)
GFR, Estimated: 38 mL/min — ABNORMAL LOW (ref >60)
Glucose, Bld: 151 mg/dL — ABNORMAL HIGH (ref 70–99)
Potassium: 4.5 mmol/L (ref 3.5–5.1)
Sodium: 137 mmol/L (ref 135–145)

## 2023-11-24 LAB — CBC WITH DIFFERENTIAL/PLATELET
Abs Immature Granulocytes: 0.14 10*3/uL — ABNORMAL HIGH (ref 0.00–0.07)
Basophils Absolute: 0 10*3/uL (ref 0.0–0.1)
Basophils Relative: 0 %
Eosinophils Absolute: 0.1 10*3/uL (ref 0.0–0.5)
Eosinophils Relative: 1 %
HCT: 32.3 % — ABNORMAL LOW (ref 39.0–52.0)
Hemoglobin: 10.2 g/dL — ABNORMAL LOW (ref 13.0–17.0)
Immature Granulocytes: 1 %
Lymphocytes Relative: 9 %
Lymphs Abs: 1.2 10*3/uL (ref 0.7–4.0)
MCH: 28.7 pg (ref 26.0–34.0)
MCHC: 31.6 g/dL (ref 30.0–36.0)
MCV: 91 fL (ref 80.0–100.0)
Monocytes Absolute: 1.2 10*3/uL — ABNORMAL HIGH (ref 0.1–1.0)
Monocytes Relative: 9 %
Neutro Abs: 10.7 10*3/uL — ABNORMAL HIGH (ref 1.7–7.7)
Neutrophils Relative %: 80 %
Platelets: 371 10*3/uL (ref 150–400)
RBC: 3.55 MIL/uL — ABNORMAL LOW (ref 4.22–5.81)
RDW: 17.7 % — ABNORMAL HIGH (ref 11.5–15.5)
WBC: 13.3 10*3/uL — ABNORMAL HIGH (ref 4.0–10.5)
nRBC: 0 % (ref 0.0–0.2)

## 2023-11-24 LAB — GLUCOSE, CAPILLARY
Glucose-Capillary: 146 mg/dL — ABNORMAL HIGH (ref 70–99)
Glucose-Capillary: 218 mg/dL — ABNORMAL HIGH (ref 70–99)
Glucose-Capillary: 225 mg/dL — ABNORMAL HIGH (ref 70–99)
Glucose-Capillary: 196 mg/dL — ABNORMAL HIGH (ref 70–99)

## 2023-11-24 LAB — PROTIME-INR
INR: 1.5 — ABNORMAL HIGH (ref 0.8–1.2)
Prothrombin Time: 18.5 s — ABNORMAL HIGH (ref 11.4–15.2)

## 2023-11-24 MED ORDER — INSULIN GLARGINE-YFGN 100 UNIT/ML ~~LOC~~ SOLN
9.0000 [IU] | Freq: Every day | SUBCUTANEOUS | Status: DC
  Administered 2023-11-24 – 2023-11-29 (×6): 9 [IU] via SUBCUTANEOUS
  Filled 2023-11-24 (×7): qty 0.09

## 2023-11-24 MED ORDER — WARFARIN SODIUM 7.5 MG PO TABS
7.5000 mg | ORAL_TABLET | Freq: Once | ORAL | Status: AC
  Administered 2023-11-24: 7.5 mg via ORAL
  Filled 2023-11-24: qty 1

## 2023-11-24 MED ORDER — INSULIN ASPART 100 UNIT/ML IJ SOLN
3.0000 [IU] | Freq: Three times a day (TID) | INTRAMUSCULAR | Status: DC
  Administered 2023-11-24 – 2023-11-29 (×14): 3 [IU] via SUBCUTANEOUS

## 2023-11-24 NOTE — Progress Notes (Signed)
PROGRESS NOTE   JAMARQUES PINEDO  ZOX:[id number] DOB: 06-13-1941 DOA: 11/13/2023 PCP: Kirstie Peri, MD   Chief Complaint  Patient presents with   hypoxia   Level of care: Telemetry  Brief Admission History:  83 y.o. male with medical history significant for type 2 diabetes, hypertension, atrial fibrillation on Coumadin, dyslipidemia, HFpEF, pulmonary hypertension, and CKD stage IIIb-IV, who presented to the ED with worsening cough and shortness of breath along with generalized weakness.  Patient was admitted with acute hypoxemic respiratory failure secondary to multifocal community-acquired pneumonia and was started on empiric antibiotics.  He has now completed course of antibiotics and is currently experiencing acute HFpEF with RV dysfunction and has been started on aggressive IV Lasix for diuresis.  Cardiology consulted for assistance in management.    Assessment and Plan:  Acute hypoxemic respiratory failure persisting in the setting of acute HFpEF with RV dysfunction -completed azithromycin and Rocephin  -Monitor cultures: no growth to date  -Wean oxygen as tolerated -Flu COVID and RSV negative -Blood cultures negative -Continue scheduled breathing treatments and Mucinex -initially treated with IV Solu-Medrol and subsequently de-escalated to oral prednisone -Up in chair and incentive spirometry encouraged -Chest x-ray 1/24 with cardiomegaly and pulmonary edema now on diuresis with IV Lasix 80 mg daily and metolazone given on 1/30 and 1/31 by cardiology team -Appreciate cardiology assistance with 2D echocardiogram ordered-noted below LVEF 65-70% indeterminate diastolic parameters, Right ventricular systolic function is moderately reduced. The right ventricular size is moderately enlarged. There is severely elevated pulmonary artery systolic pressure , mild aortic valve stenosis Filed Weights   11/22/23 0500 11/23/23 0412 11/24/23 0505  Weight: 74.5 kg 72.8 kg 72.8 kg    Intake/Output  Summary (Last 24 hours) at 11/24/2023 1012 Last data filed at 11/24/2023 8119 Gross per 24 hour  Intake 480 ml  Output 2950 ml  Net -2470 ml   Multifocal CAP -Completed treatment with azithromycin and Rocephin for 5 days -weaning steroids to complete course in next 1-2 days   Transaminitis with alkaline phosphatase elevation-resolved -Acute hepatitis panel negative -Ultrasound abdomen without any significant findings -atorvastatin resumed   Normocytic Anemia -No overt bleeding noted, continue to follow trend with stabilization noted -INR being monitored closely, continue to monitor   HFpEF with RV dysfunction -2D echocardiogram 04/2022 EF 70-75% -Continue tadalafil and Opsumit -Farxiga per cardio -Repeat 2D echocardiogram as noted above by cardiology -remains volume overloaded but has been diuresing well on IV furosemide -less short of breath   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 07/2022 -resumed atorvastatin   Type 2 diabetes with hyperglycemia Uncontrolled with neurological complications -Continue SGLT2i -Continue carb modified diet -Added SSI -added semglee and prandial novolog coverage -steroid induced hyperglycemia - weaning steroid CBG (last 3)  Recent Labs    11/23/23 1628 11/23/23 2003 11/24/23 0715  GLUCAP 105* 222* 146*   Permanent atrial fibrillation -Bradycardic and not on AV nodal blockade agents -Currently on Coumadin with supratherapeutic INR -Hold Coumadin for now, appreciate pharmacy assistance -Vitamin K given 1/24   OSA on CPAP -Continue CPAP at night   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 -Baseline creatinine around 1.8 -Continue to monitor daily with aggressive diuresis   Dyslipidemia -Resumed atorvastatin   Hypertension -Continue hydralazine holding on home amlodipine to allow for aggressive diuresis -added holding paramaters to hydralazine given soft blood pressures after diuresis   DVT prophylaxis: warfarin (therapeutic) Code Status:  Full  Family Communication: not present at bedside Disposition: anticipate home when ok with cardiology, likely  1-2 days   Consultants:  Cardiology  Procedures:   Antimicrobials:    Subjective: Pt reports that his leg edema is nearly completely resolved.    Objective: Vitals:   11/24/23 0237 11/24/23 0505 11/24/23 0550 11/24/23 0944  BP: (!) 143/42  (!) 151/40 (!) 140/37  Pulse: (!) 36  (!) 37   Resp: 19  18   Temp: 97.7 F (36.5 C)  (!) 97.2 F (36.2 C)   TempSrc: Oral     SpO2: 96%  95% 98%  Weight:  72.8 kg    Height:        Intake/Output Summary (Last 24 hours) at 11/24/2023 1012 Last data filed at 11/24/2023 0506 Gross per 24 hour  Intake 480 ml  Output 2950 ml  Net -2470 ml   Filed Weights   11/22/23 0500 11/23/23 0412 11/24/23 0505  Weight: 74.5 kg 72.8 kg 72.8 kg   Examination:  General exam: Appears calm and comfortable, he is sitting up in a chair.   Respiratory system: Clear to auscultation.  No crackles heard.  Respiratory effort normal. Cardiovascular system: bradycardic rate; normal S1 & S2 heard. No JVD, murmurs, rubs, gallops or clicks. No pedal edema. Gastrointestinal system: Abdomen is nondistended, soft and nontender. No organomegaly or masses felt. Normal bowel sounds heard. Central nervous system: Alert and oriented. No focal neurological deficits. Extremities: resolved pretibial edema; Symmetric 5 x 5 power. Skin: No rashes, lesions or ulcers. Psychiatry: Judgement and insight appear normal. Mood & affect appropriate.   Data Reviewed: I have personally reviewed following labs and imaging studies  CBC: Recent Labs  Lab 11/19/23 0509 11/20/23 0436 11/21/23 0439 11/22/23 0354 11/23/23 0448 11/24/23 0547  WBC 16.4* 16.0* 16.6* 14.6* 15.0* 13.3*  NEUTROABS 14.3*  --  14.9* 11.8* 12.1* 10.7*  HGB 8.5* 8.6* 8.5* 8.5* 9.7* 10.2*  HCT 26.9* 26.7* 26.9* 26.8* 30.9* 32.3*  MCV 88.5 88.4 89.1 89.3 89.8 91.0  PLT 334 327 333 340 369 371     Basic Metabolic Panel: Recent Labs  Lab 11/19/23 0509 11/20/23 0436 11/21/23 0439 11/22/23 0354 11/23/23 0448 11/24/23 0547  NA 136 136 136 137 136 137  K 4.5 4.2 4.0 3.4* 4.1 4.5  CL 96* 97* 96* 93* 90* 89*  CO2 27 29 29  33* 37* 36*  GLUCOSE 290* 280* 164* 182* 89 151*  BUN 122* 121* 116* 116,116* 118* 110*  CREATININE 2.39* 2.19* 2.10* 1.99* 1.76* 1.75*  CALCIUM 8.0* 8.0* 8.0* 8.3* 8.2* 8.3*  MG 2.7* 2.7* 2.6* 2.8* 2.7*  --     CBG: Recent Labs  Lab 11/23/23 0705 11/23/23 1127 11/23/23 1628 11/23/23 2003 11/24/23 0715  GLUCAP 112* 246* 105* 222* 146*    No results found for this or any previous visit (from the past 240 hours).   Radiology Studies: No results found.  Scheduled Meds:  atorvastatin  40 mg Oral QHS   dapagliflozin propanediol  10 mg Oral Daily   dextromethorphan-guaiFENesin  1 tablet Oral BID   furosemide  80 mg Intravenous Daily   hydrALAZINE  25 mg Oral BID   insulin aspart  0-5 Units Subcutaneous QHS   insulin aspart  0-9 Units Subcutaneous TID WC   insulin aspart  3 Units Subcutaneous TID WC   insulin glargine-yfgn  9 Units Subcutaneous Daily   macitentan  10 mg Oral Daily   polyethylene glycol  17 g Oral Daily   predniSONE  20 mg Oral Q breakfast   tadalafil  20 mg Oral BID  warfarin  7.5 mg Oral ONCE-1600   Warfarin - Pharmacist Dosing Inpatient   Does not apply q1600   Continuous Infusions:   LOS: 11 days   Time spent: 50 mins  Lillyann Ahart Laural Benes, MD How to contact the [facility] Attending or Consulting provider 7A - 7P or covering provider during after hours 7P -7A, for this patient?  Check the care team in [facility] and look for a) attending/consulting TRH provider listed and b) the [facility] team listed Log into [email or web address] to find provider on call.  Locate the [facility] provider you are looking for under [facility] and page to a number that you can be directly reached. If you still have difficulty reaching the provider, please page the  [facility] (Director on Call) for the Hospitalists listed on amion for assistance.  11/24/2023, 10:12 AM

## 2023-11-24 NOTE — Progress Notes (Signed)
   11/24/23 0550  Assess: MEWS Score  Temp (!) 97.2 F (36.2 C)  BP (!) 151/40  MAP (mmHg) 72  Pulse Rate (!) 37  Resp 18  SpO2 95 %  O2 Device Nasal Cannula  Assess: MEWS Score  MEWS Temp 0  MEWS Systolic 0  MEWS Pulse 2  MEWS RR 0  MEWS LOC 0  MEWS Score 2  MEWS Score Color Yellow  Assess: if the MEWS score is Yellow or Red  Were vital signs accurate and taken at a resting state? Yes  Does the patient meet 2 or more of the SIRS criteria? No  MEWS guidelines implemented  Yes, yellow  Treat  MEWS Interventions Considered administering scheduled or prn medications/treatments as ordered  Take Vital Signs  Increase Vital Sign Frequency  Yellow: Q2hr x1, continue Q4hrs until patient remains green for 12hrs  Escalate  MEWS: Escalate Yellow: Discuss with charge nurse and consider notifying provider and/or RRT  Notify: Charge Nurse/RN  Name of Charge Nurse/RN Notified Earnstine Regal, RN  Assess: SIRS CRITERIA  SIRS Temperature  0  SIRS Respirations  0  SIRS Pulse 0  SIRS WBC 0  SIRS Score Sum  0

## 2023-11-24 NOTE — Progress Notes (Signed)
PHARMACY - ANTICOAGULATION CONSULT NOTE  Pharmacy Consult for warfarin Indication: atrial fibrillation  Allergies  Allergen Reactions   Other Rash    Bandaids    Patient Measurements: Height: 5\' 7"  (170.2 cm) Weight: 72.8 kg (160 lb 7.9 oz) IBW/kg (Calculated) : 66.1  Vital Signs: Temp: 97.2 F (36.2 C) (02/02 0550) Temp Source: Oral (02/02 0237) BP: 151/40 (02/02 0550) Pulse Rate: 37 (02/02 0550)  Labs: Recent Labs    11/22/23 0354 11/23/23 0448 11/24/23 0547  HGB 8.5* 9.7* 10.2*  HCT 26.8* 30.9* 32.3*  PLT 340 369 371  LABPROT 22.4* 18.2* 18.5*  INR 2.0* 1.5* 1.5*  CREATININE 1.99* 1.76* 1.75*    Estimated Creatinine Clearance: 30.4 mL/min (A) (by C-G formula based on SCr of 1.75 mg/dL (H)).   Medical History: Past Medical History:  Diagnosis Date   (HFpEF) heart failure with preserved ejection fraction (HCC)    Arthritis    Atrial fibrillation (HCC)    Bradycardia    Carotid artery disease (HCC)    Hyperlipidemia    Hypertension    Pulmonary hypertension (HCC)    Type 2 diabetes mellitus (HCC)     Assessment: 83 year old male with known afib on chronic warfarin therapy. INR elevated initially at 7.2. Hemoglobin 10.2>9.2>8.5> 9.7, no bleeding issues noted.   Home dose listed as 7.5 mg on Mon + Fri and 5 mg ROW.   INR  3.2> 2.7> 2> 1.5> 1.5 , subtherapeutic. Slight booster dose  1/26: 5 mg   1/30 2.5mg  1/27: 5 mg   1/31 5mg  1/28: 2.5 mg   2/1 5mg  1/29: hold dose  Goal of Therapy:  INR 2-3 Monitor platelets by anticoagulation protocol: Yes   Plan:  Warfarin 7.5 mg x 1 dose, slight booster dose Daily INR  [name], BS Pharm D, BCPS Clinical Pharmacist 11/24/2023 9:16 AM

## 2023-11-25 ENCOUNTER — Telehealth: Payer: Self-pay | Admitting: Cardiology

## 2023-11-25 DIAGNOSIS — R001 Bradycardia, unspecified: Secondary | ICD-10-CM

## 2023-11-25 DIAGNOSIS — I272 Pulmonary hypertension, unspecified: Secondary | ICD-10-CM | POA: Diagnosis not present

## 2023-11-25 DIAGNOSIS — J9601 Acute respiratory failure with hypoxia: Secondary | ICD-10-CM | POA: Diagnosis not present

## 2023-11-25 LAB — GLUCOSE, CAPILLARY
Glucose-Capillary: 117 mg/dL — ABNORMAL HIGH (ref 70–99)
Glucose-Capillary: 207 mg/dL — ABNORMAL HIGH (ref 70–99)
Glucose-Capillary: 242 mg/dL — ABNORMAL HIGH (ref 70–99)
Glucose-Capillary: 193 mg/dL — ABNORMAL HIGH (ref 70–99)

## 2023-11-25 LAB — BASIC METABOLIC PANEL
Anion gap: 11 (ref 5–15)
Anion gap: 10 (ref 5–15)
BUN: 116116 mg/dL — ABNORMAL HIGH (ref 8–23)
BUN: 106 mg/dL — ABNORMAL HIGH (ref 8–23)
CO2: 33 mmol/L — ABNORMAL HIGH (ref 22–32)
CO2: 37 mmol/L — ABNORMAL HIGH (ref 22–32)
Calcium: 8.3 mg/dL — ABNORMAL LOW (ref 8.9–10.3)
Calcium: 8.3 mg/dL — ABNORMAL LOW (ref 8.9–10.3)
Chloride: 93 mmol/L — ABNORMAL LOW (ref 98–111)
Chloride: 91 mmol/L — ABNORMAL LOW (ref 98–111)
Creatinine, Ser: 1.99 mg/dL — ABNORMAL HIGH (ref 0.61–1.24)
Creatinine, Ser: 1.88 mg/dL — ABNORMAL HIGH (ref 0.61–1.24)
GFR, Estimated: 33 mL/min — ABNORMAL LOW (ref >60)
GFR, Estimated: 35 mL/min — ABNORMAL LOW (ref >60)
Glucose, Bld: 182 mg/dL — ABNORMAL HIGH (ref 70–99)
Glucose, Bld: 124 mg/dL — ABNORMAL HIGH (ref 70–99)
Potassium: 3.4 mmol/L — ABNORMAL LOW (ref 3.5–5.1)
Potassium: 3.6 mmol/L (ref 3.5–5.1)
Sodium: 137 mmol/L (ref 135–145)
Sodium: 138 mmol/L (ref 135–145)

## 2023-11-25 LAB — PROTIME-INR
INR: 1.6 — ABNORMAL HIGH (ref 0.8–1.2)
Prothrombin Time: 19.1 s — ABNORMAL HIGH (ref 11.4–15.2)

## 2023-11-25 MED ORDER — ATROPINE SULFATE 1 MG/ML IV SOLN
0.5000 mg | Freq: Once | INTRAVENOUS | Status: DC
  Filled 2023-11-25: qty 1

## 2023-11-25 MED ORDER — WARFARIN SODIUM 7.5 MG PO TABS
7.5000 mg | ORAL_TABLET | Freq: Once | ORAL | Status: AC
  Administered 2023-11-25: 7.5 mg via ORAL
  Filled 2023-11-25: qty 1

## 2023-11-25 NOTE — Progress Notes (Signed)
Rounding Note    Patient Name: Robert Baldwin Date of Encounter: 11/25/2023  [facility] Cardiologist: [name], MD   Subjective   Breathing is better  No CP  No dizzienss   Inpatient Medications    Scheduled Meds:  atorvastatin  40 mg Oral QHS   dapagliflozin propanediol  10 mg Oral Daily   dextromethorphan-guaiFENesin  1 tablet Oral BID   furosemide  80 mg Intravenous Daily   hydrALAZINE  25 mg Oral BID   insulin aspart  0-5 Units Subcutaneous QHS   insulin aspart  0-9 Units Subcutaneous TID WC   insulin aspart  3 Units Subcutaneous TID WC   insulin glargine-yfgn  9 Units Subcutaneous Daily   macitentan  10 mg Oral Daily   polyethylene glycol  17 g Oral Daily   tadalafil  20 mg Oral BID   Warfarin - Pharmacist Dosing Inpatient   Does not apply q1600   Continuous Infusions:  PRN Meds: acetaminophen **OR** acetaminophen, albuterol, ondansetron **OR** ondansetron (ZOFRAN) IV, mouth rinse   Vital Signs    Vitals:   11/24/23 1439 11/24/23 2116 11/25/23 0414 11/25/23 0427  BP: (!) 169/45 (!) 139/44  (!) 159/37  Pulse: (!) 44 (!) 37  (!) 40  Resp: 16 20  19   Temp: 97.8 F (36.6 C) 98.3 F (36.8 C)  97.7 F (36.5 C)  TempSrc: Oral Oral  Oral  SpO2: 93% 97%  95%  Weight:   71.8 kg   Height:        Intake/Output Summary (Last 24 hours) at 11/25/2023 0828 Last data filed at 11/25/2023 0414 Gross per 24 hour  Intake 1200 ml  Output 1700 ml  Net -500 ml   Net neg 8.6 L      11/25/2023    4:14 AM 11/24/2023    5:05 AM 11/23/2023    4:12 AM  Last 3 Weights  Weight (lbs) 158 lb 4.6 oz 160 lb 7.9 oz 160 lb 7.9 oz  Weight (kg) 71.8 kg 72.8 kg 72.8 kg      Telemetry    Afib slow VR    Rates 30s    - Personally Reviewed  ECG    EKG on 1/27  Atrial fib with slow ventricular repsonse  41 bpm   Nonspecific IVCD  Physical Exam   GEN: No acute distress.   Neck:JVP is normal    Cardiac: irreg, 1-2/6 systolic murmur base   Respiratory: Clear to  auscultation bilaterally. GI: Soft, nontender, non-distended  UJ:[id number]  bilateral LE edema Neuro:  Nonfocal  Psych: Normal affect   Labs    High Sensitivity Troponin:   Recent Labs  Lab 11/13/23 1050 11/13/23 1305  TROPONINIHS 80* 88*     Chemistry Recent Labs  Lab 11/21/23 0439 11/22/23 0354 11/23/23 0448 11/24/23 0547 11/25/23 0437  NA 136 137 136 137 138  K 4.0 3.4* 4.1 4.5 3.6  CL 96* 93* 90* 89* 91*  CO2 29 33* 37* 36* 37*  GLUCOSE 164* 182* 89 151* 124*  BUN 116* 116,116* 118* 110* 106*  CREATININE 2.10* 1.99* 1.76* 1.75* 1.88*  CALCIUM 8.0* 8.3* 8.2* 8.3* 8.3*  MG 2.6* 2.8* 2.7*  --   --   GFRNONAA 31* 33* 38* 38* 35*  ANIONGAP 11 11 9 12 10     Lipids No results for input(s): "CHOL", "TRIG", "HDL", "LABVLDL", "LDLCALC", "CHOLHDL" in the last 168 hours.  Hematology Recent Labs  Lab 11/22/23 0354 11/23/23 0448 11/24/23 8295  WBC 14.6* 15.0* 13.3*  RBC 3.00* 3.44* 3.55*  HGB 8.5* 9.7* 10.2*  HCT 26.8* 30.9* 32.3*  MCV 89.3 89.8 91.0  MCH 28.3 28.2 28.7  MCHC 31.7 31.4 31.6  RDW 16.8* 17.2* 17.7*  PLT 340 369 371   Thyroid No results for input(s): "TSH", "FREET4" in the last 168 hours.  BNP No results for input(s): "BNP", "PROBNP" in the last 168 hours.   DDimer No results for input(s): "DDIMER" in the last 168 hours.   Radiology    No results found.  Cardiac Studies     Patient Profile     Robert Baldwin is a 83 y.o. 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 [name].   Assessment & Plan    1.Acute on chronic HFpEF/Pulmonary HTN/RV dysfunction - 04/2023 echo: LVEF 60-65%, no MAs, indet diastolic dysfunction. D shaped septum, mild RV dysfunction,  severe pulm HTN PASP 78 - 04/2023 RHC: mean PA 44, PCWP 17, CI 4.1, PVR 3.5 woods units - Jan 2025 echo: LVE 65-70%, no WMAs, indet diastolic, mod RV enlargement and moderate dysfunction, severe pulm HTN, severe BAE, mild MS, mild AS mean grad 11, dilated fixed IVC   - followed in HF clinic for combined pre and postcapillary pulmonary HTN. Extensive workup without clear precapillary etiology, suspicion of possible group I, he has been on macitentan and tadalafil. Postcapillary hemodynamics are related to his HFpEF.    - admitted SOB primarily related to multifocal pneumonia, has had some signs of fluid overload as well.  BNP 816-->586, CT primarily pneumonia   Pt has diuresed signficantly this admit       2   Bradycardia   PT remains in slow afib  Rates 30s at rest     I have reviewed with EP   Complicated  fiven  signficant TR Would recomm ambulating patient   Follow HR and symptoms   If HR does OK, not symptomatic would d/c with close EP follow up as outpt to discuss pacer options/ If symptomatic will review again with EP more urgent path     3.Afib  On coumadin        For questions or updates, please contact [facility] Please consult [email or web address] for contact info under        Signed, Dietrich Pates, MD  11/25/2023, 8:28 AM

## 2023-11-25 NOTE — Plan of Care (Signed)
  Problem: Education: Goal: Knowledge of General Education information will improve Description: Including pain rating scale, medication(s)/side effects and non-pharmacologic comfort measures 11/25/2023 2251 by Peggye Form, RN Outcome: Progressing 11/25/2023 2038 by Peggye Form, RN Outcome: Progressing   Problem: Health Behavior/Discharge Planning: Goal: Ability to manage health-related needs will improve 11/25/2023 2251 by Peggye Form, RN Outcome: Progressing 11/25/2023 2038 by Peggye Form, RN Outcome: Progressing

## 2023-11-25 NOTE — Progress Notes (Signed)
PHARMACY - ANTICOAGULATION CONSULT NOTE  Pharmacy Consult for warfarin Indication: atrial fibrillation  Allergies  Allergen Reactions   Other Rash    Bandaids    Patient Measurements: Height: 5\' 7"  (170.2 cm) Weight: 71.8 kg (158 lb 4.6 oz) IBW/kg (Calculated) : 66.1  Vital Signs: Temp: 97.7 F (36.5 C) (02/03 0427) Temp Source: Oral (02/03 0427) BP: 159/37 (02/03 0427) Pulse Rate: 40 (02/03 0427)  Labs: Recent Labs    11/23/23 0448 11/24/23 0547 11/25/23 0437  HGB 9.7* 10.2*  --   HCT 30.9* 32.3*  --   PLT 369 371  --   LABPROT 18.2* 18.5* 19.1*  INR 1.5* 1.5* 1.6*  CREATININE 1.76* 1.75* 1.88*    Estimated Creatinine Clearance: 28.3 mL/min (A) (by C-G formula based on SCr of 1.88 mg/dL (H)).   Medical History: Past Medical History:  Diagnosis Date   (HFpEF) heart failure with preserved ejection fraction (HCC)    Arthritis    Atrial fibrillation (HCC)    Bradycardia    Carotid artery disease (HCC)    Hyperlipidemia    Hypertension    Pulmonary hypertension (HCC)    Type 2 diabetes mellitus (HCC)     Assessment: 83 year old male with known afib on chronic warfarin therapy. INR elevated initially at 7.2. Hemoglobin 10.2>9.2>8.5> 9.7, no bleeding issues noted.   Home dose listed as 7.5 mg on Mon + Fri and 5 mg ROW.   INR  3.2> 2.7> 2> 1.5> 1.5> 1.6 , subtherapeutic. Will give Slight booster dose again  Vitamin K 5mg  po x 1 dose 1/24  1/26: 5 mg   1/30 2.5mg  1/27: 5 mg   1/31 5mg  1/28: 2.5 mg   2/1 5mg  1/29: hold dose  2/2 7.5mg   Goal of Therapy:  INR 2-3 Monitor platelets by anticoagulation protocol: Yes   Plan:  Warfarin 7.5 mg x 1 dose, slight booster dose Daily INR  [name], BS Pharm D, BCPS Clinical Pharmacist 11/25/2023 10:13 AM

## 2023-11-25 NOTE — Progress Notes (Signed)
Mobility Specialist Progress Note:    11/25/23 1005  Mobility  Activity Transferred from bed to chair;Stood at bedside  Level of Assistance Contact guard assist, steadying assist  Assistive Device None  Distance Ambulated (ft) 5 ft  Range of Motion/Exercises Active;All extremities  Activity Response Tolerated well  Mobility Referral Yes  Mobility visit 1 Mobility  Mobility Specialist Start Time (ACUTE ONLY) 0955  Mobility Specialist Stop Time (ACUTE ONLY) 1005  Mobility Specialist Time Calculation (min) (ACUTE ONLY) 10 min   Pt received in bed, agreeable to mobility. Required CGA to stand and transfer with no AD. Tolerated well, asx throughout. Left pt in chair, alarm on. Call bell in reach, all needs met.   Lawerance Bach Mobility Specialist Please contact via Special educational needs teacher or  Rehab office at [phone]

## 2023-11-25 NOTE — Progress Notes (Signed)
PROGRESS NOTE   Robert Baldwin  JXB:[id number] DOB: Feb 02, 1941 DOA: 11/13/2023 PCP: Kirstie Peri, MD   Chief Complaint  Patient presents with   hypoxia   Level of care: Telemetry  Brief Admission History:  83 y.o. male with medical history significant for type 2 diabetes, hypertension, atrial fibrillation on Coumadin, dyslipidemia, HFpEF, pulmonary hypertension, and CKD stage IIIb-IV, who presented to the ED with worsening cough and shortness of breath along with generalized weakness.  Patient was admitted with acute hypoxemic respiratory failure secondary to multifocal community-acquired pneumonia and was started on empiric antibiotics.  He has now completed course of antibiotics and is currently experiencing acute HFpEF with RV dysfunction and has been started on aggressive IV Lasix for diuresis.  Cardiology consulted for assistance in management.    Assessment and Plan:  Acute hypoxemic respiratory failure persisting in the setting of acute HFpEF with RV dysfunction -completed azithromycin and Rocephin  -Monitor cultures: no growth to date  -Wean oxygen as tolerated -Flu COVID and RSV negative -Blood cultures negative -Continue scheduled breathing treatments and Mucinex -initially treated with IV Solu-Medrol and subsequently de-escalated to oral prednisone -Up in chair and incentive spirometry encouraged -Chest x-ray 1/24 with cardiomegaly and pulmonary edema now on diuresis with IV Lasix 80 mg daily and metolazone given on 1/30 and 1/31 by cardiology team -Appreciate cardiology assistance with 2D echocardiogram ordered-noted below LVEF 65-70% indeterminate diastolic parameters, Right ventricular systolic function is moderately reduced. The right ventricular size is moderately enlarged. There is severely elevated pulmonary artery systolic pressure , mild aortic valve stenosis Filed Weights   11/23/23 0412 11/24/23 0505 11/25/23 0414  Weight: 72.8 kg 72.8 kg 71.8 kg    Intake/Output  Summary (Last 24 hours) at 11/25/2023 1203 Last data filed at 11/25/2023 0414 Gross per 24 hour  Intake 720 ml  Output 1700 ml  Net -980 ml   Multifocal CAP -Completed treatment with azithromycin and Rocephin for 5 days -weaning steroids to complete course in next 1-2 days   Transaminitis with alkaline phosphatase elevation-resolved -Acute hepatitis panel negative -Ultrasound abdomen without any significant findings -atorvastatin resumed   Normocytic Anemia -No overt bleeding noted, continue to follow trend with stabilization noted -INR being monitored closely, continue to monitor   HFpEF with RV dysfunction -2D echocardiogram 04/2022 EF 70-75% -Continue tadalafil and Opsumit -Farxiga per cardio -Repeat 2D echocardiogram as noted above by cardiology -remains volume overloaded but has been diuresing well on IV furosemide -much less short of breath   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 07/2022 -resumed atorvastatin   Type 2 diabetes with hyperglycemia Uncontrolled with neurological complications -Continue SGLT2i -Continue carb modified diet -Added SSI -added semglee and prandial novolog coverage -steroid induced hyperglycemia - weaning steroid CBG (last 3)  Recent Labs    11/24/23 2119 11/25/23 0732 11/25/23 1105  GLUCAP 196* 117* 207*   Permanent atrial fibrillation -Bradycardic and not on AV nodal blockade agents -Currently on Coumadin with supratherapeutic INR -Hold Coumadin for now, appreciate pharmacy assistance -Vitamin K given 1/24  Bradycardia  -cardiology requested to ambulate patient today and if he does not have symptoms he could follow up with EP as an outpatient.   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 -Baseline creatinine around 1.8 which is where his creatinine  has returned after  diuresing  -Continue to monitor daily with aggressive diuresis   Dyslipidemia -Resumed atorvastatin   Hypertension -Continue hydralazine holding on home amlodipine to allow for aggressive diuresis -added holding paramaters to hydralazine given soft blood pressures after diuresis   DVT prophylaxis: warfarin (therapeutic) Code Status: Full  Family Communication: not present at bedside Disposition: anticipate home in 1-2 days if he doesn't require more urgent EP referral for PPM   Consultants:  Cardiology  Procedures:   Antimicrobials:    Subjective: Pt says he is concerned about his low heart rates and if he will need a pacemaker, he will discuss with cardiology otherwise he is feeling much better and he has been ambulating in room and sitting up in chair.    Objective: Vitals:   11/24/23 1439 11/24/23 2116 11/25/23 0414 11/25/23 0427  BP: (!) 169/45 (!) 139/44  (!) 159/37  Pulse: (!) 44 (!) 37  (!) 40  Resp: 16 20  19   Temp: 97.8 F (36.6 C) 98.3 F (36.8 C)  97.7 F (36.5 C)  TempSrc: Oral Oral  Oral  SpO2: 93% 97%  95%  Weight:   71.8 kg   Height:        Intake/Output Summary (Last 24 hours) at 11/25/2023 1203 Last data filed at 11/25/2023 0414 Gross per 24 hour  Intake 720 ml  Output 1700 ml  Net -980 ml   Filed Weights   11/23/23 0412 11/24/23 0505 11/25/23 0414  Weight: 72.8 kg 72.8 kg 71.8 kg   Examination:  General exam: Appears calm and comfortable, he is sitting up in a chair.   Respiratory system: Clear to auscultation.  No crackles heard.  Respiratory effort normal. Cardiovascular system: bradycardic rate; normal S1 & S2 heard. No JVD, murmurs, rubs, gallops or clicks. No pedal edema. Gastrointestinal system: Abdomen is nondistended, soft and nontender. No organomegaly or masses felt. Normal bowel sounds heard. Central nervous system: Alert and oriented. No focal neurological deficits. Extremities: resolved pretibial edema; Symmetric  5 x 5 power. Skin: No rashes, lesions or ulcers. Psychiatry: Judgement and insight appear normal. Mood & affect appropriate.   Data Reviewed: I have personally reviewed following labs and imaging studies  CBC: Recent Labs  Lab 11/19/23 0509 11/20/23 0436 11/21/23 0439 11/22/23 0354 11/23/23 0448 11/24/23 0547  WBC 16.4* 16.0* 16.6* 14.6* 15.0* 13.3*  NEUTROABS 14.3*  --  14.9* 11.8* 12.1* 10.7*  HGB 8.5* 8.6* 8.5* 8.5* 9.7* 10.2*  HCT 26.9* 26.7* 26.9* 26.8* 30.9* 32.3*  MCV 88.5 88.4 89.1 89.3 89.8 91.0  PLT 334 327 333 340 369 371    Basic Metabolic Panel: Recent Labs  Lab 11/19/23 0509 11/20/23 0436 11/21/23 0439 11/22/23 0354 11/23/23 0448 11/24/23 0547 11/25/23 0437  NA 136 136 136 137 136 137 138  K 4.5 4.2 4.0 3.4* 4.1 4.5 3.6  CL 96* 97* 96* 93* 90* 89* 91*  CO2 27 29 29  33* 37* 36* 37*  GLUCOSE 290* 280* 164* 182* 89 151* 124*  BUN 122* 121* 116* 116* 118* 110* 106*  CREATININE 2.39* 2.19* 2.10* 1.99* 1.76* 1.75* 1.88*  CALCIUM 8.0* 8.0* 8.0* 8.3* 8.2* 8.3* 8.3*  MG 2.7* 2.7* 2.6* 2.8* 2.7*  --   --     CBG: Recent Labs  Lab 11/24/23 1106 11/24/23 1612 11/24/23 2119 11/25/23 0732 11/25/23 1105  GLUCAP 218* 225* 196* 117* 207*    No results found for this or any previous visit (from the past 240 hours).   Radiology Studies: No results found.  Scheduled Meds:  atorvastatin  40 mg Oral QHS   dapagliflozin propanediol  10 mg Oral Daily   dextromethorphan-guaiFENesin  1 tablet Oral BID   furosemide  80 mg Intravenous Daily   hydrALAZINE  25 mg Oral BID   insulin aspart  0-5 Units Subcutaneous QHS   insulin aspart  0-9 Units Subcutaneous TID WC   insulin aspart  3 Units Subcutaneous TID WC   insulin glargine-yfgn  9 Units Subcutaneous Daily   macitentan  10 mg Oral Daily   polyethylene glycol  17 g Oral Daily   tadalafil  20 mg Oral BID   warfarin  7.5 mg Oral ONCE-1600   Warfarin - Pharmacist Dosing Inpatient   Does not apply q1600    Continuous Infusions:   LOS: 12 days   Time spent: 56 mins  Johnn Krasowski Laural Benes, MD How to contact the [facility] Attending or Consulting provider 7A - 7P or covering provider during after hours 7P -7A, for this patient?  Check the care team in [facility] and look for a) attending/consulting TR[facility] provider listed and b) the [facility] team listed Log into [email or web address] to find provider on call.  Locate the [facility] provider you are looking for under [facility] and page to a number that you can be directly reached. If you still have difficulty reaching the provider, please page the [facility] (Director on Call) for the Hospitalists listed on amion for assistance.  11/25/2023, 12:03 PM

## 2023-11-25 NOTE — Progress Notes (Signed)
Patients wife Atsushi Yom requesting to speak to patients cardiologist in the morning. Wife's number is [phone].

## 2023-11-25 NOTE — Telephone Encounter (Signed)
Pt's wife is requesting a call back from provider after provider rounds on pt in the hospital.

## 2023-11-25 NOTE — Plan of Care (Signed)

## 2023-11-25 NOTE — Progress Notes (Signed)
   11/25/23 2138  Assess: MEWS Score  Temp 97.6 F (36.4 C)  BP (!) 128/41  MAP (mmHg) 68  Pulse Rate (!) 39  Resp 18  Level of Consciousness Alert  SpO2 95 %  O2 Device Nasal Cannula  O2 Flow Rate (L/min) 1 L/min  Assess: MEWS Score  MEWS Temp 0  MEWS Systolic 0  MEWS Pulse 2  MEWS RR 0  MEWS LOC 0  MEWS Score 2  MEWS Score Color Yellow  Assess: if the MEWS score is Yellow or Red  Were vital signs accurate and taken at a resting state? Yes  Does the patient meet 2 or more of the SIRS criteria? No  MEWS guidelines implemented  No, previously yellow, continue vital signs every 4 hours  Notify: Charge Nurse/RN  Name of Charge Nurse/RN Notified [facility]  Provider Notification  Provider Name/Title Dr. Arville Care  Date Provider Notified 11/25/23  Time Provider Notified 2146  Method of Notification Page (secure chat)  Notification Reason Other (Comment) (yellow mews)  Provider response See new orders  Date of Provider Response 11/25/23  Assess: SIRS CRITERIA  SIRS Temperature  0  SIRS Respirations  0  SIRS Pulse 0  SIRS WBC 0  SIRS Score Sum  0

## 2023-11-25 NOTE — Telephone Encounter (Signed)
Patient is spouse is requesting to speak with a nurse in regard to the patient being in the hospital. Please advise.

## 2023-11-25 NOTE — Plan of Care (Signed)
   Problem: Education: Goal: Knowledge of General Education information will improve Description Including pain rating scale, medication(s)/side effects and non-pharmacologic comfort measures Outcome: Progressing   Problem: Health Behavior/Discharge Planning: Goal: Ability to manage health-related needs will improve Outcome: Progressing

## 2023-11-26 DIAGNOSIS — J9601 Acute respiratory failure with hypoxia: Secondary | ICD-10-CM | POA: Diagnosis not present

## 2023-11-26 DIAGNOSIS — I4891 Unspecified atrial fibrillation: Secondary | ICD-10-CM | POA: Diagnosis not present

## 2023-11-26 DIAGNOSIS — I50810 Right heart failure, unspecified: Secondary | ICD-10-CM

## 2023-11-26 DIAGNOSIS — I272 Pulmonary hypertension, unspecified: Secondary | ICD-10-CM | POA: Diagnosis not present

## 2023-11-26 DIAGNOSIS — I5033 Acute on chronic diastolic (congestive) heart failure: Secondary | ICD-10-CM | POA: Diagnosis not present

## 2023-11-26 DIAGNOSIS — R001 Bradycardia, unspecified: Secondary | ICD-10-CM | POA: Diagnosis not present

## 2023-11-26 LAB — BASIC METABOLIC PANEL
Anion gap: 12 (ref 5–15)
BUN: 105 mg/dL — ABNORMAL HIGH (ref 8–23)
CO2: 35 mmol/L — ABNORMAL HIGH (ref 22–32)
Calcium: 7.8 mg/dL — ABNORMAL LOW (ref 8.9–10.3)
Chloride: 89 mmol/L — ABNORMAL LOW (ref 98–111)
Creatinine, Ser: 1.85 mg/dL — ABNORMAL HIGH (ref 0.61–1.24)
GFR, Estimated: 36 mL/min — ABNORMAL LOW (ref >60)
Glucose, Bld: 132 mg/dL — ABNORMAL HIGH (ref 70–99)
Potassium: 3.3 mmol/L — ABNORMAL LOW (ref 3.5–5.1)
Sodium: 136 mmol/L (ref 135–145)

## 2023-11-26 LAB — GLUCOSE, CAPILLARY
Glucose-Capillary: 161 mg/dL — ABNORMAL HIGH (ref 70–99)
Glucose-Capillary: 205 mg/dL — ABNORMAL HIGH (ref 70–99)
Glucose-Capillary: 100 mg/dL — ABNORMAL HIGH (ref 70–99)
Glucose-Capillary: 105 mg/dL — ABNORMAL HIGH (ref 70–99)

## 2023-11-26 LAB — MAGNESIUM: Magnesium: 2.9 mg/dL — ABNORMAL HIGH (ref 1.7–2.4)

## 2023-11-26 LAB — PROTIME-INR
INR: 1.8 — ABNORMAL HIGH (ref 0.8–1.2)
Prothrombin Time: 20.8 s — ABNORMAL HIGH (ref 11.4–15.2)

## 2023-11-26 MED ORDER — POTASSIUM CHLORIDE CRYS ER 20 MEQ PO TBCR
40.0000 meq | EXTENDED_RELEASE_TABLET | Freq: Once | ORAL | Status: AC
  Administered 2023-11-26: 40 meq via ORAL
  Filled 2023-11-26: qty 2

## 2023-11-26 MED ORDER — WARFARIN SODIUM 5 MG PO TABS
5.0000 mg | ORAL_TABLET | Freq: Once | ORAL | Status: AC
  Administered 2023-11-26: 5 mg via ORAL
  Filled 2023-11-26: qty 1

## 2023-11-26 NOTE — Progress Notes (Signed)
 PROGRESS NOTE   Robert Baldwin  FMW:[id number] DOB: 27-Feb-1941 DOA: 11/13/2023 PCP: Maree Isles, MD   Chief Complaint  Patient presents with   hypoxia   Level of care: Telemetry  Brief Admission History:  83 y.o. male with medical history significant for type 2 diabetes, hypertension, atrial fibrillation on Coumadin , dyslipidemia, HFpEF, pulmonary hypertension, and CKD stage IIIb-IV, who presented to the ED with worsening cough and shortness of breath along with generalized weakness.  Patient was admitted with acute hypoxemic respiratory failure secondary to multifocal community-acquired pneumonia and was started on empiric antibiotics.  He has now completed course of antibiotics and is currently experiencing acute HFpEF with RV dysfunction and has been started on aggressive IV Lasix  for diuresis.  Cardiology consulted for assistance in management.    Assessment and Plan:  Acute hypoxemic respiratory failure persisting in the setting of acute HFpEF with RV dysfunction -completed azithromycin  and Rocephin   -Monitor cultures: no growth to date  -Wean oxygen  as tolerated -Flu COVID and RSV negative -Blood cultures negative -Continue scheduled breathing treatments and Mucinex  -initially treated with IV Solu-Medrol  and subsequently de-escalated to oral prednisone  -Up in chair and incentive spirometry encouraged -Chest x-ray 1/24 with cardiomegaly and pulmonary edema now on diuresis with IV Lasix  80 mg daily and metolazone  given on 1/30 and 1/31 by cardiology team -Appreciate cardiology assistance with 2D echocardiogram ordered-noted below LVEF 65-70% indeterminate diastolic parameters, Right ventricular systolic function is moderately reduced. The right ventricular size is moderately enlarged. There is severely elevated pulmonary artery systolic pressure , mild aortic valve stenosis Filed Weights   11/24/23 0505 11/25/23 0414 11/26/23 0400  Weight: 72.8 kg 71.8 kg 72 kg    Intake/Output  Summary (Last 24 hours) at 11/26/2023 1626 Last data filed at 11/26/2023 0300 Gross per 24 hour  Intake 240 ml  Output 800 ml  Net -560 ml   Multifocal CAP -Completed treatment with azithromycin  and Rocephin  for 5 days -weaning steroids to complete course in next 1-2 days   Transaminitis with alkaline phosphatase elevation-resolved -Acute hepatitis panel negative -Ultrasound abdomen without any significant findings -atorvastatin  resumed   Normocytic Anemia -No overt bleeding noted, continue to follow trend with stabilization noted -INR being monitored closely, continue to monitor   HFpEF with RV dysfunction -2D echocardiogram 04/2022 EF 70-75% -Continue tadalafil  and Opsumit  -Farxiga  per cardio -Repeat 2D echocardiogram as noted above by cardiology -remains volume overloaded but has been diuresing well on IV furosemide  -much less short of breath   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 07/2022 -resumed atorvastatin    Type 2 diabetes with hyperglycemia Uncontrolled with neurological complications -Continue SGLT2i -Continue carb modified diet -Added SSI -added semglee  and prandial novolog  coverage -steroid induced hyperglycemia - weaning steroid to complete course CBG (last 3)  Recent Labs    11/26/23 0730 11/26/23 1112 11/26/23 1614  GLUCAP 161* 205* 100*   Permanent atrial fibrillation -Bradycardic and not on AV nodal blockade agents -Currently on warfarin with pharm D managing inpatient dosing -Vitamin K given 1/24 for supratherapeutic PT  Bradycardia  -cardiology requested to ambulate patient today and if he does not have symptoms he could follow up with EP as an outpatient.   -If he becomes symptomatic when ambulating with him he would need more urgent EP evaluation per cardiology team  -PT eval requested to start ambulating which was done 2/4 and he did fairly well given his bradycardia and was mostly asymptomatic.    OSA on CPAP -Continue CPAP at  night  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 -Baseline creatinine around 1.8 which is where his creatinine has returned after diuresing  -Continue to monitor daily with aggressive diuresis   Dyslipidemia -Resumed atorvastatin    Hypertension -Continue hydralazine  holding on home amlodipine  to allow for aggressive diuresis -added holding paramaters to hydralazine  given soft blood pressures after diuresis   DVT prophylaxis: warfarin Code Status: Full  Family Communication: I called wife today and discussed situation 2/4 Disposition: anticipate home when cleared by cardiology service   Consultants:  Cardiology  Procedures:   Antimicrobials:    Subjective: Pt says he doesn't seem to be symptomatic from his low heart rates.  He was able to ambulate with PT today.    Objective: Vitals:   11/26/23 0526 11/26/23 1034 11/26/23 1048 11/26/23 1317  BP: (!) 126/45 (!) 117/35 (!) 109/51 (!) 112/45  Pulse: (!) 35 (!) 43  (!) 43  Resp: 18 17    Temp: 98.4 F (36.9 C) 98.6 F (37 C)  98.5 F (36.9 C)  TempSrc: Oral Oral    SpO2: 95% 95%  93%  Weight:      Height:        Intake/Output Summary (Last 24 hours) at 11/26/2023 1626 Last data filed at 11/26/2023 0300 Gross per 24 hour  Intake 240 ml  Output 800 ml  Net -560 ml   Filed Weights   11/24/23 0505 11/25/23 0414 11/26/23 0400  Weight: 72.8 kg 71.8 kg 72 kg   Examination:  General exam: Appears calm and comfortable, he is sitting up in a chair.   Respiratory system: Clear to auscultation.  No crackles heard.  Respiratory effort normal. Cardiovascular system: bradycardic rate; normal S1 & S2 heard. No JVD, murmurs, rubs, gallops or clicks. No pedal edema. Gastrointestinal system: Abdomen is nondistended, soft and nontender. No organomegaly or masses felt. Normal bowel sounds heard. Central nervous system: Alert and oriented. No focal neurological deficits. Extremities: resolved pretibial edema; Symmetric 5 x 5 power. Skin: No rashes,  lesions or ulcers. Psychiatry: Judgement and insight appear normal. Mood & affect appropriate.   Data Reviewed: I have personally reviewed following labs and imaging studies  CBC: Recent Labs  Lab 11/20/23 0436 11/21/23 0439 11/22/23 0354 11/23/23 0448 11/24/23 0547  WBC 16.0* 16.6* 14.6* 15.0* 13.3*  NEUTROABS  --  14.9* 11.8* 12.1* 10.7*  HGB 8.6* 8.5* 8.5* 9.7* 10.2*  HCT 26.7* 26.9* 26.8* 30.9* 32.3*  MCV 88.4 89.1 89.3 89.8 91.0  PLT 327 333 340 369 371    Basic Metabolic Panel: Recent Labs  Lab 11/20/23 0436 11/21/23 0439 11/22/23 0354 11/23/23 0448 11/24/23 0547 11/25/23 0437 11/26/23 0403  NA 136 136 137 136 137 138 136  K 4.2 4.0 3.4* 4.1 4.5 3.6 3.3*  CL 97* 96* 93* 90* 89* 91* 89*  CO2 29 29 33* 37* 36* 37* 35*  GLUCOSE 280* 164* 182* 89 151* 124* 132*  BUN 121* 116* 116* 118* 110* 106* 105*  CREATININE 2.19* 2.10* 1.99* 1.76* 1.75* 1.88* 1.85*  CALCIUM  8.0* 8.0* 8.3* 8.2* 8.3* 8.3* 7.8*  MG 2.7* 2.6* 2.8* 2.7*  --   --  2.9*    CBG: Recent Labs  Lab 11/25/23 1702 11/25/23 2142 11/26/23 0730 11/26/23 1112 11/26/23 1614  GLUCAP 242* 193* 161* 205* 100*    No results found for this or any previous visit (from the past 240 hours).   Radiology Studies: No results found.  Scheduled Meds:  atorvastatin   40 mg Oral QHS   atropine   0.5 mg Intravenous Once   dapagliflozin  propanediol  10 mg Oral Daily   dextromethorphan -guaiFENesin   1 tablet Oral BID   furosemide   80 mg Intravenous Daily   hydrALAZINE   25 mg Oral BID   insulin  aspart  0-5 Units Subcutaneous QHS   insulin  aspart  0-9 Units Subcutaneous TID WC   insulin  aspart  3 Units Subcutaneous TID WC   insulin  glargine-yfgn  9 Units Subcutaneous Daily   macitentan   10 mg Oral Daily   polyethylene glycol  17 g Oral Daily   potassium chloride   40 mEq Oral Once   tadalafil   20 mg Oral BID   Warfarin - Pharmacist Dosing Inpatient   Does not apply q1600   Continuous Infusions:   LOS: 13  days   Time spent: 50 mins  Anna Livers Vicci, MD How to contact the [facility] Attending or Consulting provider 7A - 7P or covering provider during after hours 7P -7A, for this patient?  Check the care team in [facility] and look for a) attending/consulting TRH provider listed and b) the TRH team listed Log into [email or web address] to find provider on call.  Locate the TRH provider you are looking for under [facility] and page to a number that you can be directly reached. If you still have difficulty reaching the provider, please page the [facility] (Director on Call) for the Hospitalists listed on amion for assistance.  11/26/2023, 4:26 PM

## 2023-11-26 NOTE — Progress Notes (Signed)
 Rounding Note    Patient Name: Robert Baldwin Date of Encounter: 11/26/2023  [facility] Cardiologist: Jayson Sierras, MD   Subjective   SOB improving but not resolved  Inpatient Medications    Scheduled Meds:  atorvastatin   40 mg Oral QHS   atropine   0.5 mg Intravenous Once   dapagliflozin  propanediol  10 mg Oral Daily   dextromethorphan -guaiFENesin   1 tablet Oral BID   furosemide   80 mg Intravenous Daily   hydrALAZINE   25 mg Oral BID   insulin  aspart  0-5 Units Subcutaneous QHS   insulin  aspart  0-9 Units Subcutaneous TID WC   insulin  aspart  3 Units Subcutaneous TID WC   insulin  glargine-yfgn  9 Units Subcutaneous Daily   macitentan   10 mg Oral Daily   polyethylene glycol  17 g Oral Daily   tadalafil   20 mg Oral BID   Warfarin - Pharmacist Dosing Inpatient   Does not apply q1600   Continuous Infusions:  PRN Meds: acetaminophen  **OR** acetaminophen , albuterol , ondansetron  **OR** ondansetron  (ZOFRAN ) IV, mouth rinse   Vital Signs    Vitals:   11/26/23 0151 11/26/23 0400 11/26/23 0526 11/26/23 1034  BP: (!) 136/42  (!) 126/45 (!) 117/35  Pulse: (!) 36  (!) 35 (!) 43  Resp: 20  18 17   Temp: 98.3 F (36.8 C)  98.4 F (36.9 C) 98.6 F (37 C)  TempSrc:   Oral Oral  SpO2: 96%  95% 95%  Weight:  72 kg    Height:        Intake/Output Summary (Last 24 hours) at 11/26/2023 1047 Last data filed at 11/26/2023 0300 Gross per 24 hour  Intake 240 ml  Output 2000 ml  Net -1760 ml      11/26/2023    4:00 AM 11/25/2023    4:14 AM 11/24/2023    5:05 AM  Last 3 Weights  Weight (lbs) 158 lb 11.7 oz 158 lb 4.6 oz 160 lb 7.9 oz  Weight (kg) 72 kg 71.8 kg 72.8 kg      Telemetry    Irregular rates 40s - Personally Reviewed  ECG    N/a - Personally Reviewed  Physical Exam   GEN: No acute distress.   Neck: No JVD Cardiac: irreg, 3/6 systolic murmur LLSB  Respiratory: bilateral crackles GI: Soft, nontender, non-distended  MS: No edema; No deformity. Neuro:   Nonfocal  Psych: Normal affect   Labs    High Sensitivity Troponin:   Recent Labs  Lab 11/13/23 1050 11/13/23 1305  TROPONINIHS 80* 88*     Chemistry Recent Labs  Lab 11/22/23 0354 11/23/23 0448 11/24/23 0547 11/25/23 0437 11/26/23 0403  NA 137 136 137 138 136  K 3.4* 4.1 4.5 3.6 3.3*  CL 93* 90* 89* 91* 89*  CO2 33* 37* 36* 37* 35*  GLUCOSE 182* 89 151* 124* 132*  BUN 116* 118* 110* 106* 105*  CREATININE 1.99* 1.76* 1.75* 1.88* 1.85*  CALCIUM  8.3* 8.2* 8.3* 8.3* 7.8*  MG 2.8* 2.7*  --   --  2.9*  GFRNONAA 33* 38* 38* 35* 36*  ANIONGAP 11 9 12 10 12     Lipids No results for input(s): CHOL, TRIG, HDL, LABVLDL, LDLCALC, CHOLHDL in the last 168 hours.  Hematology Recent Labs  Lab 11/22/23 0354 11/23/23 0448 11/24/23 0547  WBC 14.6* 15.0* 13.3*  RBC 3.00* 3.44* 3.55*  HGB 8.5* 9.7* 10.2*  HCT 26.8* 30.9* 32.3*  MCV 89.3 89.8 91.0  MCH 28.3 28.2 28.7  MCHC 31.7 31.4 31.6  RDW 16.8* 17.2* 17.7*  PLT 340 369 371   Thyroid No results for input(s): TSH, FREET4 in the last 168 hours.  BNPNo results for input(s): BNP, PROBNP in the last 168 hours.  DDimer No results for input(s): DDIMER in the last 168 hours.   Radiology    No results found.  Cardiac Studies     Patient Profile     Robert Baldwin is a 83 y.o. 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 Maree.   Assessment & Plan    1.Acute on chronic HFpEF/Pulmonary HTN/RV dysfunction - 04/2023 echo: LVEF 60-65%, no MAs, indet diastolic dysfunction. D shaped septum, mild RV dysfunction, severe pulm HTN PASP 78 - 04/2023 RHC: mean PA 44, PCWP 17, CI 4.1, PVR 3.5 woods units - Jan 2025 echo: LVE  65-70%, no WMAs, indet diastolic, mod RV enlargement and moderate dysfunction, severe pulm HTN, severe BAE, mild MS, mild AS mean grad 11, dilated fixed IVC   - followed in HF clinic for combined pre and postcapillary pulmonary HTN. Extensive workup without clear precapillary etiology, suspicion of possible group I, he has been on macitentan  and tadalafil . Postcapillary hemodynamics are related to his HFpEF.    - admitted SOB primarily related to multifocal pneumonia, has had some signs of fluid overload as well.  BNP 816, CT primarily pneumonia   - negative 1.7L yesterday, negative 10.1L since admission though I/Os are somewhat incomplete. Admit wt 176 lbs, today 158 lbs. He is on IV lasix  80mg  daily, labile Cr that was recently trending up but downtrend over 24 hrs since lasix  lowered to once daily.  - some ongoign lung crackles and O2 requirement, continue IV diuresis today.    2.Afib with chronic slow ventricular response - chronic slow rates from chart review, at times 30s to 40s. In absence of symptoms has been just being monitored.  - off av nodal agents.  - he is on coumadin   - seen by Dr Okey yesterday who had discussed with EP as well. Management complicated by significant TR. Recs to ambulate patient, follow HRs and symptoms.  - at rest HR 40, with ambulation at slow steady pace increases to 69. No lightheadness or dizziness. Plan would be outpatient EP f/u     3.Multifocal pneumonia - CT chest: extensive patchy consolidative opacities bilateral, consistent with multifocal pneumonia - WBC in the 30s on admission, resolving.    4.Anemia - per primary team  For questions or updates, please contact [facility] Please consult [email or web address] for contact info under        Signed, Alvan Carrier, MD  11/26/2023, 10:47 AM

## 2023-11-26 NOTE — Progress Notes (Addendum)
 Physical Therapy Treatment Patient Details Name: Robert Baldwin MRN: [id number] DOB: July 25, 1941 Today's Date: 11/26/2023   History of Present Illness Robert Baldwin is a 83 y.o. male with medical history significant for type 2 diabetes, hypertension, atrial fibrillation on Coumadin , dyslipidemia, HFpEF, pulmonary hypertension, and CKD stage IIIb-IV, who presented to the ED with worsening cough and shortness of breath along with generalized weakness.  He apparently went to his PCP office today and was found to have O2 saturation of 70% on room air as well as soft blood pressures and was advised to come to the ED for further evaluation.  He apparently went to his PCP about a week ago and at that time was placed on some prednisone  which did not help.  He denies any current discomfort and states that he is trying to cough, but cannot get anything up.  He has had some intermittent fevers and chills according to the wife at bedside.  He continues to take his medications as otherwise prescribed.    PT Comments  Patient was agreeable to therapy. Patient was able to perform all task modified [place]. Patient used rail on wall for assistance for ambulation in the hallway. RW was offered but patient preferred the rail on wall. HR and SpO2 were monitored for ambulation. Prior to ambulating HR was 48 and SpO2 90% on 2L of O2. During ambulation in the hallway HR elevated to 68 and SpO2 dropped to 88-89%. Patient tolerated ambulation with slight SOB at the conclusion of ambulation. Patient was left in chair at conclusion of session. Patient will benefit from continued skilled physical therapy in hospital and recommended venue below to increase strength, balance, endurance for safe ADLs and gait.     If plan is discharge home, recommend the following: A little help with walking and/or transfers;A little help with bathing/dressing/bathroom;Help with stairs or ramp for entrance;Assistance with cooking/housework   Can  travel by private vehicle        Equipment Recommendations  None recommended by PT    Recommendations for Other Services       Precautions / Restrictions Restrictions Weight Bearing Restrictions Per Provider Order: No     Mobility  Bed Mobility Overal bed mobility: Modified Independent               Patient Response: Cooperative  Transfers Overall transfer level: Modified independent                      Ambulation/Gait Ambulation/Gait assistance: Contact guard assist  Ambulation distance: 80 feet Assistive device: None Gait Pattern/deviations: Decreased step length - left, Decreased stance time - right, Decreased stride length Gait velocity: decreased     General Gait Details: slightly labored movement, does not like to use AD for ambulation. Relied on wall for balance.   Stairs             Wheelchair Mobility     Tilt Bed Tilt Bed Patient Response: Cooperative  Modified Rankin (Stroke Patients Only)       Balance Overall balance assessment: Needs assistance Sitting-balance support: Feet supported, No upper extremity supported Sitting balance-Leahy Scale: Good Sitting balance - Comments: seated at EOB   Standing balance support: During functional activity, No upper extremity supported Standing balance-Leahy Scale: Fair Standing balance comment: fair/good                            Cognition Arousal: Alert Behavior During  Therapy: WFL for tasks assessed/performed Overall Cognitive Status: Within Functional Limits for tasks assessed                                          Exercises General Exercises - Lower Extremity Long Arc Quad: Seated, AROM, Both, 10 reps Hip Flexion/Marching: Seated, AROM, Both, 10 reps Toe Raises: Seated, AROM, Both, 10 reps Heel Raises: Seated, AROM, Both, 10 reps    General Comments        Pertinent Vitals/Pain Pain Assessment Pain Assessment: No/denies pain     Home Living                          Prior Function            PT Goals (current goals can now be found in the care plan section) Acute Rehab PT Goals Patient Stated Goal: return home with family to assist PT Goal Formulation: With patient/family Time For Goal Achievement: 12/03/23 Potential to Achieve Goals: Good Progress towards PT goals: Progressing toward goals    Frequency    Min 3X/week      PT Plan      Co-evaluation              AM-PAC PT 6 Clicks Mobility   Outcome Measure  Help needed turning from your back to your side while in a flat bed without using bedrails?: None Help needed moving from lying on your back to sitting on the side of a flat bed without using bedrails?: None Help needed moving to and from a bed to a chair (including a wheelchair)?: A Little Help needed standing up from a chair using your arms (e.g., wheelchair or bedside chair)?: A Little Help needed to walk in hospital room?: A Little Help needed climbing 3-5 steps with a railing? : A Little 6 Click Score: 20    End of Session Equipment Utilized During Treatment: Oxygen  Activity Tolerance: Patient tolerated treatment well;Patient limited by fatigue Patient left: in chair;with call bell/phone within reach;with family/visitor present Nurse Communication: Mobility status PT Visit Diagnosis: Unsteadiness on feet (R26.81);Other abnormalities of gait and mobility (R26.89);Muscle weakness (generalized) (M62.81)     Time: 8961-8889 PT Time Calculation (min) (ACUTE ONLY): 32 min  Charges:    $Gait Training: 8-22 mins $Therapeutic Exercise: 8-22 mins PT General Charges $$ ACUTE PT VISIT: 1 Visit                     Mackenzie burns SPT    This licensed practitioner was present in the room guiding the student in service delivery. Therapy student was participating in the provision of services, and the practitioner was not engaged in treating another patient  or doing other tasks at the same time.  1:31 PM, 11/26/23 Lynwood Music, MPT Physical Therapist with [facility] 336 [phone] office [phone] mobile phone

## 2023-11-26 NOTE — Progress Notes (Signed)
 PHARMACY - ANTICOAGULATION CONSULT NOTE  Pharmacy Consult for warfarin Indication: atrial fibrillation  Allergies  Allergen Reactions   Other Rash    Bandaids    Patient Measurements: Height: 5' 7 (170.2 cm) Weight: 72 kg (158 lb 11.7 oz) IBW/kg (Calculated) : 66.1  Vital Signs: Temp: 98.6 F (37 C) (02/04 1034) Temp Source: Oral (02/04 1034) BP: 109/51 (02/04 1048) Pulse Rate: 43 (02/04 1034)  Labs: Recent Labs    11/24/23 0547 11/25/23 0437 11/26/23 0403  HGB 10.2*  --   --   HCT 32.3*  --   --   PLT 371  --   --   LABPROT 18.5* 19.1* 20.8*  INR 1.5* 1.6* 1.8*  CREATININE 1.75* 1.88* 1.85*    Estimated Creatinine Clearance: 28.8 mL/min (A) (by C-G formula based on SCr of 1.85 mg/dL (H)).   Medical History: Past Medical History:  Diagnosis Date   (HFpEF) heart failure with preserved ejection fraction (HCC)    Arthritis    Atrial fibrillation (HCC)    Bradycardia    Carotid artery disease (HCC)    Hyperlipidemia    Hypertension    Pulmonary hypertension (HCC)    Type 2 diabetes mellitus (HCC)     Assessment: 83 year old male with known afib on chronic warfarin therapy. INR elevated initially at 7.2. Hemoglobin 10.2>9.2>8.5> 9.7, no bleeding issues noted.   Home dose listed as 7.5 mg on Mon + Fri and 5 mg ROW.   INR  3.2> 2.7> 2> 1.5> 1.5> 1.6 > 1.8, subtherapeutic. Will give Slight booster dose again  Vitamin K 5mg  po x 1 dose 1/24  1/26: 5 mg   1/30 2.5mg  1/27: 5 mg   1/31 5mg  1/28: 2.5 mg   2/1 5mg  1/29: hold dose  2/2 7.5mg   Goal of Therapy:  INR 2-3 Monitor platelets by anticoagulation protocol: Yes   Plan:  Warfarin 5 mg x 1 dose Daily INR  Elspeth Sour, PharmD Clinical Pharmacist 11/26/2023 11:02 AM

## 2023-11-27 DIAGNOSIS — I5031 Acute diastolic (congestive) heart failure: Secondary | ICD-10-CM | POA: Diagnosis not present

## 2023-11-27 DIAGNOSIS — J189 Pneumonia, unspecified organism: Secondary | ICD-10-CM | POA: Diagnosis not present

## 2023-11-27 DIAGNOSIS — J9601 Acute respiratory failure with hypoxia: Secondary | ICD-10-CM | POA: Diagnosis not present

## 2023-11-27 DIAGNOSIS — I272 Pulmonary hypertension, unspecified: Secondary | ICD-10-CM | POA: Diagnosis not present

## 2023-11-27 DIAGNOSIS — E782 Mixed hyperlipidemia: Secondary | ICD-10-CM | POA: Diagnosis not present

## 2023-11-27 LAB — GLUCOSE, CAPILLARY
Glucose-Capillary: 104 mg/dL — ABNORMAL HIGH (ref 70–99)
Glucose-Capillary: 166 mg/dL — ABNORMAL HIGH (ref 70–99)
Glucose-Capillary: 208 mg/dL — ABNORMAL HIGH (ref 70–99)
Glucose-Capillary: 121 mg/dL — ABNORMAL HIGH (ref 70–99)

## 2023-11-27 LAB — CBC
HCT: 31.2 % — ABNORMAL LOW (ref 39.0–52.0)
Hemoglobin: 9.7 g/dL — ABNORMAL LOW (ref 13.0–17.0)
MCH: 28.4 pg (ref 26.0–34.0)
MCHC: 31.1 g/dL (ref 30.0–36.0)
MCV: 91.5 fL (ref 80.0–100.0)
Platelets: 292 10*3/uL (ref 150–400)
RBC: 3.41 MIL/uL — ABNORMAL LOW (ref 4.22–5.81)
RDW: 18.2 % — ABNORMAL HIGH (ref 11.5–15.5)
WBC: 10.2 10*3/uL (ref 4.0–10.5)
nRBC: 0 % (ref 0.0–0.2)

## 2023-11-27 LAB — MAGNESIUM: Magnesium: 2.8 mg/dL — ABNORMAL HIGH (ref 1.7–2.4)

## 2023-11-27 LAB — BASIC METABOLIC PANEL
Anion gap: 8 (ref 5–15)
BUN: 105 mg/dL — ABNORMAL HIGH (ref 8–23)
CO2: 35 mmol/L — ABNORMAL HIGH (ref 22–32)
Calcium: 7.7 mg/dL — ABNORMAL LOW (ref 8.9–10.3)
Chloride: 92 mmol/L — ABNORMAL LOW (ref 98–111)
Creatinine, Ser: 1.81 mg/dL — ABNORMAL HIGH (ref 0.61–1.24)
GFR, Estimated: 37 mL/min — ABNORMAL LOW (ref >60)
Glucose, Bld: 97 mg/dL (ref 70–99)
Potassium: 3.9 mmol/L (ref 3.5–5.1)
Sodium: 135 mmol/L (ref 135–145)

## 2023-11-27 LAB — PROTIME-INR
INR: 2 — ABNORMAL HIGH (ref 0.8–1.2)
Prothrombin Time: 22.7 s — ABNORMAL HIGH (ref 11.4–15.2)

## 2023-11-27 MED ORDER — TORSEMIDE 20 MG PO TABS
40.0000 mg | ORAL_TABLET | Freq: Every evening | ORAL | Status: DC
  Administered 2023-11-28: 40 mg via ORAL
  Filled 2023-11-27: qty 2

## 2023-11-27 MED ORDER — TORSEMIDE 20 MG PO TABS
80.0000 mg | ORAL_TABLET | Freq: Every morning | ORAL | Status: DC
  Administered 2023-11-28 – 2023-11-29 (×2): 80 mg via ORAL
  Filled 2023-11-27 (×2): qty 4

## 2023-11-27 MED ORDER — WARFARIN SODIUM 5 MG PO TABS
5.0000 mg | ORAL_TABLET | Freq: Once | ORAL | Status: AC
  Administered 2023-11-27: 5 mg via ORAL
  Filled 2023-11-27: qty 1

## 2023-11-27 NOTE — Progress Notes (Signed)
 PHARMACY - ANTICOAGULATION CONSULT NOTE  Pharmacy Consult for warfarin Indication: atrial fibrillation  Allergies  Allergen Reactions   Other Rash    Bandaids    Patient Measurements: Height: 5' 7 (170.2 cm) Weight: 72.3 kg (159 lb 6.3 oz) IBW/kg (Calculated) : 66.1  Vital Signs: Temp: 97.5 F (36.4 C) (02/05 0601) Temp Source: Oral (02/04 2202) BP: 139/43 (02/05 0601) Pulse Rate: 38 (02/05 0601)  Labs: Recent Labs    11/25/23 0437 11/26/23 0403 11/27/23 0417  HGB  --   --  9.7*  HCT  --   --  31.2*  PLT  --   --  292  LABPROT 19.1* 20.8* 22.7*  INR 1.6* 1.8* 2.0*  CREATININE 1.88* 1.85* 1.81*    Estimated Creatinine Clearance: 29.4 mL/min (A) (by C-G formula based on SCr of 1.81 mg/dL (H)).   Medical History: Past Medical History:  Diagnosis Date   (HFpEF) heart failure with preserved ejection fraction (HCC)    Arthritis    Atrial fibrillation (HCC)    Bradycardia    Carotid artery disease (HCC)    Hyperlipidemia    Hypertension    Pulmonary hypertension (HCC)    Type 2 diabetes mellitus (HCC)     Assessment: 83 year old male with known afib on chronic warfarin therapy. INR elevated initially at 7.2. Hemoglobin 10.2>9.2>8.5> 9.7, no bleeding issues noted.   Home dose listed as 7.5 mg on Mon + Fri and 5 mg ROW.   INR  3.2> 2.7> 2> 1.5> 1.5> 1.6 > 1.8>2.0, therapeutic.  Vitamin K 5mg  po x 1 dose 1/24    Goal of Therapy:  INR 2-3 Monitor platelets by anticoagulation protocol: Yes   Plan:  Warfarin 5 mg x 1 dose Daily INR  Elspeth Sour, PharmD Clinical Pharmacist 11/27/2023 8:15 AM

## 2023-11-27 NOTE — Progress Notes (Signed)
 Mobility Specialist Progress Note:    11/27/23 1335  Mobility  Activity Ambulated with assistance in hallway;Ambulated with assistance to bathroom;Transferred from chair to bed  Level of Assistance Standby assist, set-up cues, supervision of patient - no hands on  Assistive Device None (leans on rails)  Distance Ambulated (ft) 70 ft  Range of Motion/Exercises Active;All extremities  Activity Response Tolerated well  Mobility Referral Yes  Mobility visit 1 Mobility  Mobility Specialist Start Time (ACUTE ONLY) 1335  Mobility Specialist Stop Time (ACUTE ONLY) 1400  Mobility Specialist Time Calculation (min) (ACUTE ONLY) 25 min   Pt received in chair, wife in room. Agreeable to mobility. Required SBA to stand and ambulate with no AD. Pt uses walls and hand rails for stability.Tolerated well, HR 40 BPM at rest. HR fluctuated between 55 to 63 BPM during ambulation. SpO2 fluctuated between 88-92% on 2L throughout session. Left pt supine, alarm on. All needs met.  Orest Dygert Mobility Specialist Please contact via Special Educational Needs Teacher or  Rehab office at [phone]

## 2023-11-27 NOTE — Progress Notes (Signed)
 PROGRESS NOTE  Robert Baldwin FMW:[id number] DOB: 09-03-1941 DOA: 11/13/2023 PCP: Maree Isles, MD  Brief History:  83 y.o. male with medical history significant for type 2 diabetes, hypertension, atrial fibrillation on Coumadin , dyslipidemia, HFpEF, pulmonary hypertension, and CKD stage IIIb-IV, who presented to the ED with worsening cough and shortness of breath along with generalized weakness.  Patient was admitted with acute hypoxemic respiratory failure secondary to multifocal community-acquired pneumonia and was started on empiric antibiotics.  He has now completed course of antibiotics and is currently experiencing acute HFpEF with RV dysfunction and has been started on aggressive IV Lasix  for diuresis.  Cardiology consulted for assistance in management.     Assessment/Plan: Acute hypoxemic respiratory failure  -due to pneumonia and CHF -now stable on 2L -wean oxygen  as tolerated for saturation >92%  Lobar pneumonia -completed one week abx and steroids -COVID/RSV and Flu neg -blood cultures neg  acute HFpEF with RV dysfunction -Appreciate cardiology assistance with 2D echocardiogram ordered-noted below LVEF 65-70% indeterminate diastolic parameters, Right ventricular systolic function is moderately reduced. The right ventricular size is moderately enlarged. There is severely elevated pulmonary artery systolic pressure , mild aortic valve stenosis -IV lasix >>torsemide  on 2/6 -continue Farxiga  -Neg 11L -ReDS 33   Transaminitis with alkaline phosphatase elevation-resolved -Acute hepatitis panel negative -Ultrasound abdomen without any significant findings -atorvastatin  resumed   Normocytic Anemia -No overt bleeding noted, continue to follow trend with stabilization noted -INR being monitored closely, continue to monitor   Pulmonary HTN -2D echocardiogram 04/2022 EF 70-75% -Continue tadalafil  and Opsumit  -follow up advanced HF clinic   Severe bilateral ICA stenosis  status post TCAR 07/2022 -resumed atorvastatin    Type 2 diabetes with hyperglycemia Uncontrolled with neurological complications -Continue SGLT2i -Continue carb modified diet -Added SSI -added semglee  and prandial novolog  coverage -steroid induced hyperglycemia -    Permanent atrial fibrillation -Bradycardic and not on AV nodal blockade agents -Currently on warfarin with pharm D managing inpatient dosing -Vitamin K given 1/24 for supratherapeutic PT - Dr. Okey reviewed with EP earlier this week and he did ambulate and HR increased into the 60's and he denied any associated symptoms. Plans are for follow-up with EP as an outpatient.    Bradycardia  -cardiology requested to ambulate patient today and if he does not have symptoms he could follow up with EP as an outpatient.   -If he becomes symptomatic when ambulating with him he would need more urgent EP evaluation per cardiology team  -PT eval requested to start ambulating which was done 2/4 and he did fairly well given his bradycardia and was mostly asymptomatic.    OSA on CPAP -Continue CPAP at night   CKD stage IIIB -Baseline creatinine 1.7-2.0 -Continue to monitor daily with aggressive diuresis   Dyslipidemia -Resumed atorvastatin    Hypertension -Continue hydralazine   -holding on home amlodipine  to allow BP margin for diuresis       Family Communication:  no Family at bedside  Consultants:  cardiology  Code Status:  FULL  DVT Prophylaxis:  coumadin    Procedures: As Listed in Progress Note Above  Antibiotics: None        Subjective: Patient denies fevers, chills, headache, chest pain, dyspnea, nausea, vomiting, diarrhea, abdominal pain, dysuria, hematuria, hematochezia, and melena.   Objective: Vitals:   11/26/23 2202 11/27/23 0601 11/27/23 1134 11/27/23 1535  BP: (!) 152/37 (!) 139/43 (!) 123/40 (!) 122/39  Pulse: (!) 40 (!) 38 (!) 39 (!) 39  Resp: 20 18  16   Temp: (!) 97.5 F (36.4 C) (!)  97.5 F (36.4 C) 97.8 F (36.6 C) 97.9 F (36.6 C)  TempSrc: Oral  Oral Oral  SpO2: 96% 97% 100% 100%  Weight:  72.3 kg    Height:        Intake/Output Summary (Last 24 hours) at 11/27/2023 1900 Last data filed at 11/27/2023 1200 Gross per 24 hour  Intake 240 ml  Output 1800 ml  Net -1560 ml   Weight change: 0.3 kg Exam:  General:  Pt is alert, follows commands appropriately, not in acute distress HEENT: No icterus, No thrush, No neck mass, Todd/AT Cardiovascular: RRR, S1/S2, no rubs, no gallops Respiratory: fine bibasilar crackles.  No wheeze Abdomen: Soft/+BS, non tender, non distended, no guarding Extremities: No edema, No lymphangitis, No petechiae, No rashes, no synovitis   Data Reviewed: I have personally reviewed following labs and imaging studies Basic Metabolic Panel: Recent Labs  Lab 11/21/23 0439 11/22/23 0354 11/23/23 0448 11/24/23 0547 11/25/23 0437 11/26/23 0403 11/27/23 0417  NA 136 137 136 137 138 136 135  K 4.0 3.4* 4.1 4.5 3.6 3.3* 3.9  CL 96* 93* 90* 89* 91* 89* 92*  CO2 29 33* 37* 36* 37* 35* 35*  GLUCOSE 164* 182* 89 151* 124* 132* 97  BUN 116* 116* 118* 110* 106* 105* 105*  CREATININE 2.10* 1.99* 1.76* 1.75* 1.88* 1.85* 1.81*  CALCIUM  8.0* 8.3* 8.2* 8.3* 8.3* 7.8* 7.7*  MG 2.6* 2.8* 2.7*  --   --  2.9* 2.8*   Liver Function Tests: No results for input(s): AST, ALT, ALKPHOS, BILITOT, PROT, ALBUMIN  in the last 168 hours. No results for input(s): LIPASE, AMYLASE in the last 168 hours. No results for input(s): AMMONIA in the last 168 hours. Coagulation Profile: Recent Labs  Lab 11/23/23 0448 11/24/23 0547 11/25/23 0437 11/26/23 0403 11/27/23 0417  INR 1.5* 1.5* 1.6* 1.8* 2.0*   CBC: Recent Labs  Lab 11/21/23 0439 11/22/23 0354 11/23/23 0448 11/24/23 0547 11/27/23 0417  WBC 16.6* 14.6* 15.0* 13.3* 10.2  NEUTROABS 14.9* 11.8* 12.1* 10.7*  --   HGB 8.5* 8.5* 9.7* 10.2* 9.7*  HCT 26.9* 26.8* 30.9* 32.3* 31.2*  MCV  89.1 89.3 89.8 91.0 91.5  PLT 333 340 369 371 292   Cardiac Enzymes: No results for input(s): CKTOTAL, CKMB, CKMBINDEX, TROPONINI in the last 168 hours. BNP: Invalid input(s): POCBNP CBG: Recent Labs  Lab 11/26/23 1614 11/26/23 2154 11/27/23 0717 11/27/23 1134 11/27/23 1614  GLUCAP 100* 105* 104* 166* 208*   HbA1C: No results for input(s): HGBA1C in the last 72 hours. Urine analysis:    Component Value Date/Time   COLORURINE YELLOW 11/13/2023 1210   APPEARANCEUR HAZY (A) 11/13/2023 1210   LABSPEC 1.010 11/13/2023 1210   PHURINE 5.0 11/13/2023 1210   GLUCOSEU >=500 (A) 11/13/2023 1210   HGBUR SMALL (A) 11/13/2023 1210   BILIRUBINUR NEGATIVE 11/13/2023 1210   KETONESUR NEGATIVE 11/13/2023 1210   PROTEINUR 30 (A) 11/13/2023 1210   NITRITE NEGATIVE 11/13/2023 1210   LEUKOCYTESUR NEGATIVE 11/13/2023 1210   Sepsis Labs: @LABRCNTIP (procalcitonin:4,lacticidven:4) )No results found for this or any previous visit (from the past 240 hours).   Scheduled Meds:  atorvastatin   40 mg Oral QHS   atropine   0.5 mg Intravenous Once   dapagliflozin  propanediol  10 mg Oral Daily   dextromethorphan -guaiFENesin   1 tablet Oral BID   hydrALAZINE   25 mg Oral BID   insulin  aspart  0-5 Units Subcutaneous QHS   insulin   aspart  0-9 Units Subcutaneous TID WC   insulin  aspart  3 Units Subcutaneous TID WC   insulin  glargine-yfgn  9 Units Subcutaneous Daily   macitentan   10 mg Oral Daily   polyethylene glycol  17 g Oral Daily   tadalafil   20 mg Oral BID   [START ON 11/28/2023] torsemide   40 mg Oral QPM   [START ON 11/28/2023] torsemide   80 mg Oral q AM   Warfarin - Pharmacist Dosing Inpatient   Does not apply q1600   Continuous Infusions:  Procedures/Studies: ECHOCARDIOGRAM COMPLETE Result Date: 11/19/2023    ECHOCARDIOGRAM REPORT   Patient Name:   MASASHI SNOWDON Date of Exam: 11/19/2023 Medical Rec #:  [id number]   Height:       67.0 in Accession #:    [id number]  Weight:       171.3 lb  Date of Birth:  05-Jun-1941   BSA:          1.893 m Patient Age:    82 years    BP:           153/42 mmHg Patient Gender: M           HR:           41 bpm. Exam Location:  [facility] Procedure: 2D Echo, Color Doppler and Cardiac Doppler Indications:    R06.9 DOE  History:        Patient has prior history of Echocardiogram examinations, most                 recent 05/14/2023. Pulmonary HTN, Arrythmias:Atrial Fibrillation;                 Risk Factors:Hypertension, Diabetes and Dyslipidemia.  Sonographer:    Damien Senior RDCS Referring Phys: [id number] DORN FALCON BRANCH IMPRESSIONS  1. Left ventricular ejection fraction, by estimation, is 65 to 70%. The left ventricle has normal function. The left ventricle has no regional wall motion abnormalities. Left ventricular diastolic parameters are indeterminate.  2. Right ventricular systolic function is moderately reduced. The right ventricular size is moderately enlarged. There is severely elevated pulmonary artery systolic pressure.  3. Left atrial size was severely dilated.  4. Right atrial size was severely dilated.  5. A small pericardial effusion is present. The pericardial effusion is circumferential.  6. The mitral valve is abnormal. No evidence of mitral valve regurgitation. Mild mitral stenosis. The mean mitral valve gradient is 4.7 mmHg.Heart rate is 43 bpm.  7. The tricuspid valve is abnormal. Tricuspid valve regurgitation is moderate.  8. The aortic valve was not well visualized. Aortic valve regurgitation is not visualized. Mild aortic valve stenosis. Aortic valve area, by VTI measures 2.34 cm. Aortic valve mean gradient measures 11.0 mmHg.  9. The inferior vena cava is dilated in size with <50% respiratory variability, suggesting right atrial pressure of 15 mmHg. FINDINGS  Left Ventricle: Left ventricular ejection fraction, by estimation, is 65 to 70%. The left ventricle has normal function. The left ventricle has no regional wall motion abnormalities. The left  ventricular internal cavity size was normal in size. There is  no left ventricular hypertrophy. Left ventricular diastolic parameters are indeterminate. Right Ventricle: The right ventricular size is moderately enlarged. Right vetricular wall thickness was not well visualized. Right ventricular systolic function is moderately reduced. There is severely elevated pulmonary artery systolic pressure. The tricuspid regurgitant velocity is 3.91 m/s, and with an assumed right atrial pressure of 15 mmHg, the estimated right ventricular systolic pressure  is 76.2 mmHg. Left Atrium: Left atrial size was severely dilated. Right Atrium: Right atrial size was severely dilated. Pericardium: A small pericardial effusion is present. The pericardial effusion is circumferential. Mitral Valve: The mitral valve is abnormal. No evidence of mitral valve regurgitation. Mild mitral valve stenosis. MV peak gradient, 26.3 mmHg. The mean mitral valve gradient is 4.7 mmHg. Tricuspid Valve: The tricuspid valve is abnormal. Tricuspid valve regurgitation is moderate . No evidence of tricuspid stenosis. Aortic Valve: The aortic valve was not well visualized. Aortic valve regurgitation is not visualized. Mild aortic stenosis is present. Aortic valve mean gradient measures 11.0 mmHg. Aortic valve peak gradient measures 26.2 mmHg. Aortic valve area, by VTI  measures 2.34 cm. Pulmonic Valve: The pulmonic valve was not well visualized. Pulmonic valve regurgitation is not visualized. No evidence of pulmonic stenosis. Aorta: The aortic root and ascending aorta are structurally normal, with no evidence of dilitation. Venous: The inferior vena cava is dilated in size with less than 50% respiratory variability, suggesting right atrial pressure of 15 mmHg. IAS/Shunts: No atrial level shunt detected by color flow Doppler.  LEFT VENTRICLE PLAX 2D LVIDd:         5.00 cm LVIDs:         3.30 cm LV PW:         1.00 cm LV IVS:        0.90 cm LVOT diam:     2.10 cm  LV SV:         144 LV SV Index:   76 LVOT Area:     3.46 cm  RIGHT VENTRICLE RV S prime:     11.50 cm/s TAPSE (M-mode): 2.8 cm LEFT ATRIUM              Index        RIGHT ATRIUM           Index LA diam:        6.20 cm  3.27 cm/m   RA Area:     40.20 cm LA Vol (A2C):   114.0 ml 60.21 ml/m  RA Volume:   163.00 ml 86.09 ml/m LA Vol (A4C):   130.0 ml 68.66 ml/m LA Biplane Vol: 124.0 ml 65.49 ml/m  AORTIC VALVE AV Area (Vmax):    2.07 cm AV Area (Vmean):   2.50 cm AV Area (VTI):     2.34 cm AV Vmax:           256.00 cm/s AV Vmean:          151.500 cm/s AV VTI:            0.613 m AV Peak Grad:      26.2 mmHg AV Mean Grad:      11.0 mmHg LVOT Vmax:         153.00 cm/s LVOT Vmean:        109.500 cm/s LVOT VTI:          0.415 m LVOT/AV VTI ratio: 0.68  AORTA Ao Root diam: 3.00 cm Ao Asc diam:  3.20 cm MITRAL VALVE             TRICUSPID VALVE MV Area VTI:  1.84 cm   TR Peak grad:   61.2 mmHg MV Peak grad: 26.3 mmHg  TR Vmax:        391.00 cm/s MV Mean grad: 4.7 mmHg MV Vmax:      2.56 m/s   SHUNTS MV Vmean:     84.5 cm/s  Systemic  VTI:  0.42 m                          Systemic Diam: 2.10 cm Dorn Ross MD Electronically signed by Dorn Ross MD Signature Date/Time: 11/19/2023/3:34:23 PM    Final    DG CHEST PORT 1 VIEW Result Date: 11/15/2023 CLINICAL DATA:  Dyspnea EXAM: PORTABLE CHEST 1 VIEW COMPARISON:  11/13/2023 FINDINGS: Cardiomegaly with vascular congestion and small right effusion. Multifocal airspace opacities suggestive of pneumonia. No pneumothorax IMPRESSION: Cardiomegaly with vascular congestion and small right effusion. Multifocal airspace opacities suggestive of pneumonia. Electronically Signed   By: Luke Bun M.D.   On: 11/15/2023 19:55   US  Abdomen Limited RUQ (LIVER/GB) Result Date: 11/15/2023 CLINICAL DATA:  Transaminitis EXAM: ULTRASOUND ABDOMEN LIMITED RIGHT UPPER QUADRANT COMPARISON:  None Available. FINDINGS: Gallbladder: Gallbladder is underdistended. Shadowing stones. No  adjacent fluid. Common bile duct: Diameter: 3 mm Liver: Slightly heterogeneous echogenic liver. Portal vein is patent on color Doppler imaging with normal direction of blood flow towards the liver. Other: None. IMPRESSION: Gallbladder is underdistended. There is some shadowing stones. No reported Murphy sign. No ductal dilatation. Slightly heterogeneous and nodular liver. Electronically Signed   By: Ranell Bring M.D.   On: 11/15/2023 10:39   CT Chest Wo Contrast Result Date: 11/13/2023 CLINICAL DATA:  Respiratory illness.  Hypoxia. EXAM: CT CHEST WITHOUT CONTRAST TECHNIQUE: Multidetector CT imaging of the chest was performed following the standard protocol without IV contrast. RADIATION DOSE REDUCTION: This exam was performed according to the departmental dose-optimization program which includes automated exposure control, adjustment of the mA and/or kV according to patient size and/or use of iterative reconstruction technique. COMPARISON:  Chest x-ray from same day. CT chest dated May 03, 2022. FINDINGS: Cardiovascular: Unchanged cardiomegaly and small pericardial effusion. No thoracic aortic aneurysm. Coronary, aortic arch, and branch vessel atherosclerotic vascular disease. Mediastinum/Nodes: Prominent mediastinal lymph nodes measuring up to 1.4 cm in short axis, increased in size since the prior study. No enlarged axillary lymph nodes. Thyroid gland, trachea, and esophagus demonstrate no significant findings. Lungs/Pleura: Trace bilateral pleural effusions, slightly larger on the left. Extensive and patchy consolidative opacities throughout both lungs. No pneumothorax. Upper Abdomen: No acute abnormality. Unchanged nodular liver contour. Musculoskeletal: No acute or significant osseous findings. IMPRESSION: 1. Extensive and patchy consolidative opacities throughout both lungs, consistent with multifocal pneumonia. 2. Trace bilateral pleural effusions. 3. Unchanged cardiomegaly and small pericardial effusion.  4.  Aortic Atherosclerosis (ICD10-I70.0). Electronically Signed   By: Elsie ONEIDA Shoulder M.D.   On: 11/13/2023 13:07   DG Chest Port 1 View Result Date: 11/13/2023 CLINICAL DATA:  Shortness of breath with hypoxia and low blood pressure. EXAM: PORTABLE CHEST 1 VIEW COMPARISON:  05/01/2022 FINDINGS: Low volume film. The cardio pericardial silhouette is enlarged. Diffuse interstitial opacity is similar to mildly progressive in the interval and a component of interstitial edema is not excluded. Probable tiny bilateral pleural effusions. No acute bony abnormality. Telemetry leads overlie the chest. IMPRESSION: Low volume film with diffuse interstitial opacity, similar to mildly progressive in the interval. A component of interstitial edema is not excluded. Electronically Signed   By: Camellia Candle M.D.   On: 11/13/2023 11:15    Alm Schneider, DO  [facility]  If 7PM-7AM, please contact night-coverage [email or web address] Password [email or web address] 11/27/2023, 7:00 PM   LOS: 14 days

## 2023-11-27 NOTE — Progress Notes (Addendum)
 Patient Name: Robert Baldwin Date of Encounter: 11/27/2023 [facility] Cardiologist: Jayson Sierras, MD   Interval Summary  .    Feels that his breathing has improved but not back to baseline. On 2L [place]. No chest pain or palpitations. ReDS Vest at 33 when checked yesterday.   Vital Signs .    Vitals:   11/26/23 1048 11/26/23 1317 11/26/23 2202 11/27/23 0601  BP: (!) 109/51 (!) 112/45 (!) 152/37 (!) 139/43  Pulse:  (!) 43 (!) 40 (!) 38  Resp:   20 18  Temp:  98.5 F (36.9 C) (!) 97.5 F (36.4 C) (!) 97.5 F (36.4 C)  TempSrc:   Oral   SpO2:  93% 96% 97%  Weight:    72.3 kg  Height:        Intake/Output Summary (Last 24 hours) at 11/27/2023 0941 Last data filed at 11/27/2023 0601 Gross per 24 hour  Intake --  Output 1250 ml  Net -1250 ml      11/27/2023    6:01 AM 11/26/2023    4:00 AM 11/25/2023    4:14 AM  Last 3 Weights  Weight (lbs) 159 lb 6.3 oz 158 lb 11.7 oz 158 lb 4.6 oz  Weight (kg) 72.3 kg 72 kg 71.8 kg      Telemetry/ECG/Cardiac Studies    Atrial fibrillation with slow, ventricular response, HR in 30's to 40's.  - Personally Reviewed  Echocardiogram: 10/2023 IMPRESSIONS    1. Left ventricular ejection fraction, by estimation, is 65 to 70%. The  left ventricle has normal function. The left ventricle has no regional  wall motion abnormalities. Left ventricular diastolic parameters are  indeterminate.   2. Right ventricular systolic function is moderately reduced. The right  ventricular size is moderately enlarged. There is severely elevated  pulmonary artery systolic pressure.   3. Left atrial size was severely dilated.   4. Right atrial size was severely dilated.   5. A small pericardial effusion is present. The pericardial effusion is  circumferential.   6. The mitral valve is abnormal. No evidence of mitral valve  regurgitation. Mild mitral stenosis. The mean mitral valve gradient is 4.7  mmHg.Heart rate is 43 bpm.   7. The tricuspid valve is  abnormal. Tricuspid valve regurgitation is  moderate.   8. The aortic valve was not well visualized. Aortic valve regurgitation  is not visualized. Mild aortic valve stenosis. Aortic valve area, by VTI  measures 2.34 cm. Aortic valve mean gradient measures 11.0 mmHg.   9. The inferior vena cava is dilated in size with <50% respiratory  variability, suggesting right atrial pressure of 15 mmHg.   Physical Exam .    GEN: Pleasant male appearing in no acute distress.   Neck: No JVD Cardiac: Irregularly irregular with bradycardiac rate. 2/6 systolic murmur.  Respiratory: Clear to auscultation bilaterally. GI: Soft, nontender, non-distended  MS: No pitting edema  Assessment & Plan .     1. Acute HFpEF/RV Dysfunction - Repeat echo this admission shows a preserved EF of 60-65% with moderate RV dysfunction and severe pulmonary HTN with PASP at 76 mmHg.  - Was receiving IV Lasix  80 mg 3 times daily for diuresis and this was reduced to once daily dosing on 2/2 given rise in renal function. He has responded well with a recorded net output of -11.3 L thus far (-1.2 L yesterday) and weight has declined from 176 lbs to 159 lbs. Also receiving Farxiga  10mg  daily. ReDS Vest at 54  yesterday and while he is on 2L [place], it is unclear how much of this is driven by CHF vs. pulmonary issues. He did already receive IV Lasix  this morning and can likely transition back to Torsemide  tomorrow (was on 80mg  in AM/40mg  in PM prior to admission).    2.  Pulmonary Hypertension - Followed by the Advanced Heart Failure team as an outpatient and felt to be mixed group 1/2. He has been continued on Opsumit  and Tadalafil .    3. Permanent Atrial Fibrillation - He has a longstanding history of atrial fibrillation with slow ventricular response and has not been on AV nodal blocking agents given this. Dr. Okey reviewed with EP earlier this week and he did ambulate and HR increased into the 60's and he denied any associated  symptoms. Plans are for follow-up with EP as an outpatient.  - Remains on Coumadin  for anticoagulation. Dosing being managed by pharmacy and INR at 2.0 today.    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 Peaked at 2.78 this admission and has improved with diuresis, stable at 1.81 today.    5. CAP - WBC peaked at 38.3 on admission and has now normalized, at 10.2. today. Completed course of antibiotics this admission.    For questions or updates, please contact [facility] Please consult [email or web address] for contact info under      Signed, Laymon CHRISTELLA Qua, PA-C    Attending note:  Patient seen and examined, chart reviewed.  Robert Baldwin has made progress in terms of fluid status, still with oxygen  requirement and has not ambulated very much as yet.  ReDS clip measurement was 33 yesterday.  He is afebrile, heart rate in the 40s and in atrial fibrillation as before.  Blood pressure 139/43.  Net output of approximately 1200 cc last 24 hours.  Cardiac exam with irregularly irregular bradycardia and 2/6 systolic murmur.  Lungs with decreased breath sounds and scattered rhonchi particular at the bases.  No peripheral edema.  Lab work includes potassium 3.9, BUN 105, creatinine 1.81, hemoglobin 9.7, INR 2.0.  Agree with recommendation to switch back to Demadex  beginning tomorrow, 80 mg a.m. and 40 mg p.m.  Otherwise continue Farxiga , hydralazine , Opsumit , tadalafil , and Coumadin .  Jayson JUDITHANN Sierras, M.D., F.A.C.C.

## 2023-11-28 DIAGNOSIS — I4821 Permanent atrial fibrillation: Secondary | ICD-10-CM

## 2023-11-28 DIAGNOSIS — J189 Pneumonia, unspecified organism: Secondary | ICD-10-CM | POA: Diagnosis not present

## 2023-11-28 DIAGNOSIS — I5033 Acute on chronic diastolic (congestive) heart failure: Secondary | ICD-10-CM | POA: Diagnosis not present

## 2023-11-28 DIAGNOSIS — J9601 Acute respiratory failure with hypoxia: Secondary | ICD-10-CM | POA: Diagnosis not present

## 2023-11-28 DIAGNOSIS — I272 Pulmonary hypertension, unspecified: Secondary | ICD-10-CM | POA: Diagnosis not present

## 2023-11-28 DIAGNOSIS — I5031 Acute diastolic (congestive) heart failure: Secondary | ICD-10-CM | POA: Diagnosis not present

## 2023-11-28 LAB — BASIC METABOLIC PANEL
Anion gap: 10 (ref 5–15)
BUN: 102 mg/dL — ABNORMAL HIGH (ref 8–23)
CO2: 33 mmol/L — ABNORMAL HIGH (ref 22–32)
Calcium: 7.5 mg/dL — ABNORMAL LOW (ref 8.9–10.3)
Chloride: 93 mmol/L — ABNORMAL LOW (ref 98–111)
Creatinine, Ser: 1.67 mg/dL — ABNORMAL HIGH (ref 0.61–1.24)
GFR, Estimated: 41 mL/min — ABNORMAL LOW (ref >60)
Glucose, Bld: 86 mg/dL (ref 70–99)
Potassium: 3.7 mmol/L (ref 3.5–5.1)
Sodium: 136 mmol/L (ref 135–145)

## 2023-11-28 LAB — GLUCOSE, CAPILLARY
Glucose-Capillary: 101 mg/dL — ABNORMAL HIGH (ref 70–99)
Glucose-Capillary: 117 mg/dL — ABNORMAL HIGH (ref 70–99)
Glucose-Capillary: 155 mg/dL — ABNORMAL HIGH (ref 70–99)
Glucose-Capillary: 156 mg/dL — ABNORMAL HIGH (ref 70–99)

## 2023-11-28 LAB — MAGNESIUM: Magnesium: 2.7 mg/dL — ABNORMAL HIGH (ref 1.7–2.4)

## 2023-11-28 LAB — PROTIME-INR
INR: 1.8 — ABNORMAL HIGH (ref 0.8–1.2)
Prothrombin Time: 20.7 s — ABNORMAL HIGH (ref 11.4–15.2)

## 2023-11-28 MED ORDER — WARFARIN SODIUM 7.5 MG PO TABS
7.5000 mg | ORAL_TABLET | Freq: Once | ORAL | Status: AC
  Administered 2023-11-28: 7.5 mg via ORAL
  Filled 2023-11-28: qty 1

## 2023-11-28 NOTE — Progress Notes (Signed)
 PROGRESS NOTE  Robert Baldwin FMW:[id number] DOB: 09-Mar-1941 DOA: 11/13/2023 PCP: Maree Isles, MD  Brief History:  83 y.o. male with medical history significant for type 2 diabetes, hypertension, atrial fibrillation on Coumadin , dyslipidemia, HFpEF, pulmonary hypertension, and CKD stage IIIb-IV, who presented to the ED with worsening cough and shortness of breath along with generalized weakness.  Patient was admitted with acute hypoxemic respiratory failure secondary to multifocal community-acquired pneumonia and was started on empiric antibiotics.  He has now completed course of antibiotics and is currently experiencing acute HFpEF with RV dysfunction and has been started on aggressive IV Lasix  for diuresis.  Cardiology consulted for assistance in management.  He improved clinically and he was ultimately transitioned to po torsemide  on 11/28/23     Assessment/Plan:  Acute hypoxemic respiratory failure  -due to pneumonia and CHF -now stable on 2L -wean oxygen  as tolerated for saturation >92% -desaturated with ambulation on RA -plan to go home with 2L [place]   Lobar pneumonia -completed one week abx and steroids -COVID/RSV and Flu neg -blood cultures neg -stable off abx   acute HFpEF with RV dysfunction -Appreciate cardiology assistance with 2D echocardiogram ordered-noted below LVEF 65-70% indeterminate diastolic parameters, Right ventricular systolic function is moderately reduced. The right ventricular size is moderately enlarged. There is severely elevated pulmonary artery systolic pressure , mild aortic valve stenosis -IV lasix >>torsemide  on 2/6 -continue Farxiga  -Neg 11.7L -ReDS 33 on 2/4   Transaminitis with alkaline phosphatase elevation-resolved -Acute hepatitis panel negative -Ultrasound abdomen without any significant findings -atorvastatin  resumed   Normocytic Anemia -No overt bleeding noted, continue to follow trend with stabilization noted -INR being monitored  closely, continue to monitor   Pulmonary HTN -2D echocardiogram 04/2022 EF 70-75% -Continue tadalafil  and Opsumit  -follow up advanced HF clinic   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 07/2022 -resumed atorvastatin    Type 2 diabetes with hyperglycemia Uncontrolled with neurological complications -Continue SGLT2i -Continue carb modified diet -Added SSI -added semglee  and prandial novolog  coverage -steroid induced hyperglycemia -    Permanent atrial fibrillation -Bradycardic and not on AV nodal blockade agents -Currently on warfarin with pharm D managing inpatient dosing -Vitamin K given 1/24 for supratherapeutic PT - Dr. Okey reviewed with EP earlier this week and he did ambulate and HR increased into the 60's and he denied any associated symptoms. Plans are for follow-up with EP as an outpatient.    Bradycardia  -cardiology requested to ambulate patient today and if he does not have symptoms he could follow up with EP as an outpatient.   -If he becomes symptomatic when ambulating with him he would need more urgent EP evaluation per cardiology team  -PT eval requested to start ambulating which was done 2/4 and he did fairly well given his bradycardia and was mostly asymptomatic.    OSA on CPAP -Continue CPAP at night   CKD stage IIIB -Baseline creatinine 1.7-2.0 -Continue to monitor daily with aggressive diuresis   Dyslipidemia -Resumed atorvastatin    Hypertension -Continue hydralazine   -holding on home amlodipine  to allow BP margin for diuresis             Family Communication:  spouse 2/6   Consultants:  cardiology   Code Status:  FULL   DVT Prophylaxis:  coumadin      Procedures: As Listed in Progress Note Above   Antibiotics: None       Subjective: Patient denies fevers, chills, headache, chest pain, dyspnea, nausea,  vomiting, diarrhea, abdominal pain, dysuria, hematuria, hematochezia, and melena.   Objective: Vitals:   11/28/23 0131  11/28/23 0550 11/28/23 0804 11/28/23 1248  BP: (!) 128/39 (!) 136/41 (!) 141/51 (!) 133/48  Pulse: (!) 41  (!) 45 (!) 50  Resp: 18 16  17   Temp: 98.2 F (36.8 C) 97.8 F (36.6 C) 98.5 F (36.9 C)   TempSrc:   Oral   SpO2: 95% 96% 97% 95%  Weight:  73.2 kg    Height:        Intake/Output Summary (Last 24 hours) at 11/28/2023 1719 Last data filed at 11/28/2023 1300 Gross per 24 hour  Intake 1400 ml  Output 2100 ml  Net -700 ml   Weight change: 0.9 kg Exam:  General:  Pt is alert, follows commands appropriately, not in acute distress HEENT: No icterus, No thrush, No neck mass, [place]/AT Cardiovascular: IRRR, S1/S2, no rubs, no gallops Respiratory: fine bibasilar crackles.  No wheeze Abdomen: Soft/+BS, non tender, non distended, no guarding Extremities: No edema, No lymphangitis, No petechiae, No rashes, no synovitis   Data Reviewed: I have personally reviewed following labs and imaging studies Basic Metabolic Panel: Recent Labs  Lab 11/22/23 0354 11/23/23 0448 11/24/23 0547 11/25/23 0437 11/26/23 0403 11/27/23 0417 11/28/23 0413  NA 137 136 137 138 136 135 136  K 3.4* 4.1 4.5 3.6 3.3* 3.9 3.7  CL 93* 90* 89* 91* 89* 92* 93*  CO2 33* 37* 36* 37* 35* 35* 33*  GLUCOSE 182* 89 151* 124* 132* 97 86  BUN 116* 118* 110* 106* 105* 105* 102*  CREATININE 1.99* 1.76* 1.75* 1.88* 1.85* 1.81* 1.67*  CALCIUM  8.3* 8.2* 8.3* 8.3* 7.8* 7.7* 7.5*  MG 2.8* 2.7*  --   --  2.9* 2.8* 2.7*   Liver Function Tests: No results for input(s): AST, ALT, ALKPHOS, BILITOT, PROT, ALBUMIN  in the last 168 hours. No results for input(s): LIPASE, AMYLASE in the last 168 hours. No results for input(s): AMMONIA in the last 168 hours. Coagulation Profile: Recent Labs  Lab 11/24/23 0547 11/25/23 0437 11/26/23 0403 11/27/23 0417 11/28/23 0413  INR 1.5* 1.6* 1.8* 2.0* 1.8*   CBC: Recent Labs  Lab 11/22/23 0354 11/23/23 0448 11/24/23 0547 11/27/23 0417  WBC 14.6* 15.0* 13.3*  10.2  NEUTROABS 11.8* 12.1* 10.7*  --   HGB 8.5* 9.7* 10.2* 9.7*  HCT 26.8* 30.9* 32.3* 31.2*  MCV 89.3 89.8 91.0 91.5  PLT 340 369 371 292   Cardiac Enzymes: No results for input(s): CKTOTAL, CKMB, CKMBINDEX, TROPONINI in the last 168 hours. BNP: Invalid input(s): POCBNP CBG: Recent Labs  Lab 11/27/23 1614 11/27/23 2126 11/28/23 0725 11/28/23 1116 11/28/23 1607  GLUCAP 208* 121* 101* 117* 155*   HbA1C: No results for input(s): HGBA1C in the last 72 hours. Urine analysis:    Component Value Date/Time   COLORURINE YELLOW 11/13/2023 1210   APPEARANCEUR HAZY (A) 11/13/2023 1210   LABSPEC 1.010 11/13/2023 1210   PHURINE 5.0 11/13/2023 1210   GLUCOSEU >=500 (A) 11/13/2023 1210   HGBUR SMALL (A) 11/13/2023 1210   BILIRUBINUR NEGATIVE 11/13/2023 1210   KETONESUR NEGATIVE 11/13/2023 1210   PROTEINUR 30 (A) 11/13/2023 1210   NITRITE NEGATIVE 11/13/2023 1210   LEUKOCYTESUR NEGATIVE 11/13/2023 1210   Sepsis Labs: @LABRCNTIP (procalcitonin:4,lacticidven:4) )No results found for this or any previous visit (from the past 240 hours).   Scheduled Meds:  atorvastatin   40 mg Oral QHS   atropine   0.5 mg Intravenous Once   dapagliflozin  propanediol  10 mg  Oral Daily   dextromethorphan -guaiFENesin   1 tablet Oral BID   hydrALAZINE   25 mg Oral BID   insulin  aspart  0-5 Units Subcutaneous QHS   insulin  aspart  0-9 Units Subcutaneous TID WC   insulin  aspart  3 Units Subcutaneous TID WC   insulin  glargine-yfgn  9 Units Subcutaneous Daily   macitentan   10 mg Oral Daily   polyethylene glycol  17 g Oral Daily   tadalafil   20 mg Oral BID   torsemide   40 mg Oral QPM   torsemide   80 mg Oral q AM   Warfarin - Pharmacist Dosing Inpatient   Does not apply q1600   Continuous Infusions:  Procedures/Studies: ECHOCARDIOGRAM COMPLETE Result Date: 11/19/2023    ECHOCARDIOGRAM REPORT   Patient Name:   CHEN SAADEH Date of Exam: 11/19/2023 Medical Rec #:  [id number]   Height:       67.0  in Accession #:    [id number]  Weight:       171.3 lb Date of Birth:  [birth year]   BSA:          1.893 m Patient Age:    82 years    BP:           153/42 mmHg Patient Gender: M           HR:           41 bpm. Exam Location:  [facility] Procedure: 2D Echo, Color Doppler and Cardiac Doppler Indications:    R06.9 DOE  History:        Patient has prior history of Echocardiogram examinations, most                 recent 05/14/2023. Pulmonary HTN, Arrythmias:Atrial Fibrillation;                 Risk Factors:Hypertension, Diabetes and Dyslipidemia.  Sonographer:    Damien Senior RDCS Referring Phys: [id number] DORN FALCON BRANCH IMPRESSIONS  1. Left ventricular ejection fraction, by estimation, is 65 to 70%. The left ventricle has normal function. The left ventricle has no regional wall motion abnormalities. Left ventricular diastolic parameters are indeterminate.  2. Right ventricular systolic function is moderately reduced. The right ventricular size is moderately enlarged. There is severely elevated pulmonary artery systolic pressure.  3. Left atrial size was severely dilated.  4. Right atrial size was severely dilated.  5. A small pericardial effusion is present. The pericardial effusion is circumferential.  6. The mitral valve is abnormal. No evidence of mitral valve regurgitation. Mild mitral stenosis. The mean mitral valve gradient is 4.7 mmHg.Heart rate is 43 bpm.  7. The tricuspid valve is abnormal. Tricuspid valve regurgitation is moderate.  8. The aortic valve was not well visualized. Aortic valve regurgitation is not visualized. Mild aortic valve stenosis. Aortic valve area, by VTI measures 2.34 cm. Aortic valve mean gradient measures 11.0 mmHg.  9. The inferior vena cava is dilated in size with <50% respiratory variability, suggesting right atrial pressure of 15 mmHg. FINDINGS  Left Ventricle: Left ventricular ejection fraction, by estimation, is 65 to 70%. The left ventricle has normal function. The left ventricle  has no regional wall motion abnormalities. The left ventricular internal cavity size was normal in size. There is  no left ventricular hypertrophy. Left ventricular diastolic parameters are indeterminate. Right Ventricle: The right ventricular size is moderately enlarged. Right vetricular wall thickness was not well visualized. Right ventricular systolic function is moderately reduced. There is severely elevated pulmonary artery systolic  pressure. The tricuspid regurgitant velocity is 3.91 m/s, and with an assumed right atrial pressure of 15 mmHg, the estimated right ventricular systolic pressure is 76.2 mmHg. Left Atrium: Left atrial size was severely dilated. Right Atrium: Right atrial size was severely dilated. Pericardium: A small pericardial effusion is present. The pericardial effusion is circumferential. Mitral Valve: The mitral valve is abnormal. No evidence of mitral valve regurgitation. Mild mitral valve stenosis. MV peak gradient, 26.3 mmHg. The mean mitral valve gradient is 4.7 mmHg. Tricuspid Valve: The tricuspid valve is abnormal. Tricuspid valve regurgitation is moderate . No evidence of tricuspid stenosis. Aortic Valve: The aortic valve was not well visualized. Aortic valve regurgitation is not visualized. Mild aortic stenosis is present. Aortic valve mean gradient measures 11.0 mmHg. Aortic valve peak gradient measures 26.2 mmHg. Aortic valve area, by VTI  measures 2.34 cm. Pulmonic Valve: The pulmonic valve was not well visualized. Pulmonic valve regurgitation is not visualized. No evidence of pulmonic stenosis. Aorta: The aortic root and ascending aorta are structurally normal, with no evidence of dilitation. Venous: The inferior vena cava is dilated in size with less than 50% respiratory variability, suggesting right atrial pressure of 15 mmHg. IAS/Shunts: No atrial level shunt detected by color flow Doppler.  LEFT VENTRICLE PLAX 2D LVIDd:         5.00 cm LVIDs:         3.30 cm LV PW:          1.00 cm LV IVS:        0.90 cm LVOT diam:     2.10 cm LV SV:         144 LV SV Index:   76 LVOT Area:     3.46 cm  RIGHT VENTRICLE RV S prime:     11.50 cm/s TAPSE (M-mode): 2.8 cm LEFT ATRIUM              Index        RIGHT ATRIUM           Index LA diam:        6.20 cm  3.27 cm/m   RA Area:     40.20 cm LA Vol (A2C):   114.0 ml 60.21 ml/m  RA Volume:   163.00 ml 86.09 ml/m LA Vol (A4C):   130.0 ml 68.66 ml/m LA Biplane Vol: 124.0 ml 65.49 ml/m  AORTIC VALVE AV Area (Vmax):    2.07 cm AV Area (Vmean):   2.50 cm AV Area (VTI):     2.34 cm AV Vmax:           256.00 cm/s AV Vmean:          151.500 cm/s AV VTI:            0.613 m AV Peak Grad:      26.2 mmHg AV Mean Grad:      11.0 mmHg LVOT Vmax:         153.00 cm/s LVOT Vmean:        109.500 cm/s LVOT VTI:          0.415 m LVOT/AV VTI ratio: 0.68  AORTA Ao Root diam: 3.00 cm Ao Asc diam:  3.20 cm MITRAL VALVE             TRICUSPID VALVE MV Area VTI:  1.84 cm   TR Peak grad:   61.2 mmHg MV Peak grad: 26.3 mmHg  TR Vmax:        391.00 cm/s MV Mean grad:  4.7 mmHg MV Vmax:      2.56 m/s   SHUNTS MV Vmean:     84.5 cm/s  Systemic VTI:  0.42 m                          Systemic Diam: 2.10 cm Dorn Ross MD Electronically signed by Dorn Ross MD Signature Date/Time: 11/19/2023/3:34:23 PM    Final    DG CHEST PORT 1 VIEW Result Date: 11/15/2023 CLINICAL DATA:  Dyspnea EXAM: PORTABLE CHEST 1 VIEW COMPARISON:  11/13/2023 FINDINGS: Cardiomegaly with vascular congestion and small right effusion. Multifocal airspace opacities suggestive of pneumonia. No pneumothorax IMPRESSION: Cardiomegaly with vascular congestion and small right effusion. Multifocal airspace opacities suggestive of pneumonia. Electronically Signed   By: Luke Bun M.D.   On: 11/15/2023 19:55   US  Abdomen Limited RUQ (LIVER/GB) Result Date: 11/15/2023 CLINICAL DATA:  Transaminitis EXAM: ULTRASOUND ABDOMEN LIMITED RIGHT UPPER QUADRANT COMPARISON:  None Available. FINDINGS: Gallbladder:  Gallbladder is underdistended. Shadowing stones. No adjacent fluid. Common bile duct: Diameter: 3 mm Liver: Slightly heterogeneous echogenic liver. Portal vein is patent on color Doppler imaging with normal direction of blood flow towards the liver. Other: None. IMPRESSION: Gallbladder is underdistended. There is some shadowing stones. No reported Murphy sign. No ductal dilatation. Slightly heterogeneous and nodular liver. Electronically Signed   By: Ranell Bring M.D.   On: 11/15/2023 10:39   CT Chest Wo Contrast Result Date: 11/13/2023 CLINICAL DATA:  Respiratory illness.  Hypoxia. EXAM: CT CHEST WITHOUT CONTRAST TECHNIQUE: Multidetector CT imaging of the chest was performed following the standard protocol without IV contrast. RADIATION DOSE REDUCTION: This exam was performed according to the departmental dose-optimization program which includes automated exposure control, adjustment of the mA and/or kV according to patient size and/or use of iterative reconstruction technique. COMPARISON:  Chest x-ray from same day. CT chest dated May 03, 2022. FINDINGS: Cardiovascular: Unchanged cardiomegaly and small pericardial effusion. No thoracic aortic aneurysm. Coronary, aortic arch, and branch vessel atherosclerotic vascular disease. Mediastinum/Nodes: Prominent mediastinal lymph nodes measuring up to 1.4 cm in short axis, increased in size since the prior study. No enlarged axillary lymph nodes. Thyroid gland, trachea, and esophagus demonstrate no significant findings. Lungs/Pleura: Trace bilateral pleural effusions, slightly larger on the left. Extensive and patchy consolidative opacities throughout both lungs. No pneumothorax. Upper Abdomen: No acute abnormality. Unchanged nodular liver contour. Musculoskeletal: No acute or significant osseous findings. IMPRESSION: 1. Extensive and patchy consolidative opacities throughout both lungs, consistent with multifocal pneumonia. 2. Trace bilateral pleural effusions. 3.  Unchanged cardiomegaly and small pericardial effusion. 4.  Aortic Atherosclerosis (ICD10-I70.0). Electronically Signed   By: Elsie ONEIDA Shoulder M.D.   On: 11/13/2023 13:07   DG Chest Port 1 View Result Date: 11/13/2023 CLINICAL DATA:  Shortness of breath with hypoxia and low blood pressure. EXAM: PORTABLE CHEST 1 VIEW COMPARISON:  05/01/2022 FINDINGS: Low volume film. The cardio pericardial silhouette is enlarged. Diffuse interstitial opacity is similar to mildly progressive in the interval and a component of interstitial edema is not excluded. Probable tiny bilateral pleural effusions. No acute bony abnormality. Telemetry leads overlie the chest. IMPRESSION: Low volume film with diffuse interstitial opacity, similar to mildly progressive in the interval. A component of interstitial edema is not excluded. Electronically Signed   By: Camellia Candle M.D.   On: 11/13/2023 11:15    Alm Schneider, DO  [facility]  If 7PM-7AM, please contact night-coverage [email or web address] Password [email or web address] 11/28/2023, 5:19 PM   LOS:  15 days

## 2023-11-28 NOTE — Progress Notes (Signed)
 PHARMACY - ANTICOAGULATION CONSULT NOTE  Pharmacy Consult for warfarin Indication: atrial fibrillation  Allergies  Allergen Reactions   Other Rash    Bandaids    Patient Measurements: Height: 5' 7 (170.2 cm) Weight: 73.2 kg (161 lb 6 oz) IBW/kg (Calculated) : 66.1  Vital Signs: Temp: 98.5 F (36.9 C) (02/06 0804) Temp Source: Oral (02/06 0804) BP: 141/51 (02/06 0804) Pulse Rate: 45 (02/06 0804)  Labs: Recent Labs    11/26/23 0403 11/27/23 0417 11/28/23 0413  HGB  --  9.7*  --   HCT  --  31.2*  --   PLT  --  292  --   LABPROT 20.8* 22.7* 20.7*  INR 1.8* 2.0* 1.8*  CREATININE 1.85* 1.81* 1.67*    Estimated Creatinine Clearance: 31.9 mL/min (A) (by C-G formula based on SCr of 1.67 mg/dL (H)).   Medical History: Past Medical History:  Diagnosis Date   (HFpEF) heart failure with preserved ejection fraction (HCC)    Arthritis    Atrial fibrillation (HCC)    Bradycardia    Carotid artery disease (HCC)    Hyperlipidemia    Hypertension    Pulmonary hypertension (HCC)    Type 2 diabetes mellitus (HCC)     Assessment: 83 year old male with known afib on chronic warfarin therapy. INR elevated initially at 7.2. Hemoglobin 10.2>9.2>8.5> 9.7, no bleeding issues noted.   Home dose listed as 7.5 mg on Mon + Fri and 5 mg ROW.   INR  down again to 1.8 subtherapeutic today.  Vitamin K 5mg  po x 1 dose 1/24  Goal of Therapy:  INR 2-3 Monitor platelets by anticoagulation protocol: Yes   Plan:  Warfarin 7.5 mg x 1 today Daily INR for now  Dempsey Blush PharmD., BCPS Clinical Pharmacist 11/28/2023 9:16 AM

## 2023-11-28 NOTE — Progress Notes (Signed)
 SATURATION QUALIFICATIONS: (This note is used to comply with regulatory documentation for home oxygen )   Patient Saturations on Room Air at Rest = 90   Patient Saturations on Room Air while Ambulating = 80   Patient Saturations on 2 Liters of oxygen  while Ambulating = 95   Please briefly explain why patient needs home oxygen : To maintain 02 sat at 90% or above during ambulation.  Alm Schneider, DO

## 2023-11-28 NOTE — TOC Progression Note (Signed)
 Transition of Care [facility]) - Progression Note    Patient Details  Name: Robert Baldwin MRN: [id number] Date of Birth: Nov 15, 1940  [facility] CM/SW Contact  Sharlyne Stabs, RN Phone Number: 11/28/2023, 1:15 PM  Clinical Narrative:   RN requested for CM to call patient's wife. She is now wanting HHPT and would like an Aide to assist with baths. CMS choices discussed.  She is agreeable to [facility]. Referral sent to [facility]. MD aware to order.    Expected Discharge Plan: Home w Home Health Services Barriers to Discharge: Continued Medical Work up  Expected Discharge Plan and Services In-house Referral: Clinical Social Work     Living arrangements for the past 2 months: Single Family Home        HH Arranged: PT, Nurse's Aide HH Agency: [facility] Date [facility] Agency Contacted: 11/28/23 Time HH Agency Contacted: 1315 Representative spoke with at [facility] Agency: Darleene   Social Determinants of Health (SDOH) Interventions SDOH Screenings   Food Insecurity: No Food Insecurity (11/13/2023)  Housing: Low Risk  (11/13/2023)  Transportation Needs: No Transportation Needs (11/13/2023)  Utilities: Not At Risk (11/13/2023)  Social Connections: Socially Integrated (11/13/2023)  Tobacco Use: Low Risk  (11/13/2023)    Readmission Risk Interventions    11/27/2023    2:06 PM  Readmission Risk Prevention Plan  Transportation Screening Complete  HRI or Home Care Consult Complete  Social Work Consult for Recovery Care Planning/Counseling Complete  Palliative Care Screening Not Applicable  Medication Review Oceanographer) Complete

## 2023-11-28 NOTE — Progress Notes (Signed)
 Mobility Specialist Progress Note:    11/28/23 0955  Mobility  Activity Transferred from bed to chair  Level of Assistance Standby assist, set-up cues, supervision of patient - no hands on  Assistive Device None  Distance Ambulated (ft) 4 ft  Range of Motion/Exercises Active;All extremities  Activity Response Tolerated well  Mobility Referral Yes  Mobility visit 1 Mobility  Mobility Specialist Start Time (ACUTE ONLY) 0945  Mobility Specialist Stop Time (ACUTE ONLY) 0955  Mobility Specialist Time Calculation (min) (ACUTE ONLY) 10 min   Pt received in bed, agreeable to mobility. Required SBA to stand and ambulate with no AD. Tolerated well, asx throughout. Left pt in chair, alarm on. Call bell in reach, all needs met.   Sherrilee Ditty Mobility Specialist Please contact via Special Educational Needs Teacher or  Rehab office at [phone]'

## 2023-11-28 NOTE — Progress Notes (Signed)
 Progress Note  Patient Name: Robert Baldwin Date of Encounter: 11/28/2023  Primary Cardiologist: Jayson Sierras, MD  Interval Summary   Patient sitting in bedside chair.  No shortness of breath at rest.  Plans to walk later with assistance.  Net diuresis of approximately 2000 cc since yesterday.  Transition to oral diuretics today.  Vital Signs    Vitals:   11/27/23 2023 11/28/23 0131 11/28/23 0550 11/28/23 0804  BP: (!) 116/33 (!) 128/39 (!) 136/41 (!) 141/51  Pulse: (!) 41 (!) 41  (!) 45  Resp: 20 18 16    Temp: 98 F (36.7 C) 98.2 F (36.8 C) 97.8 F (36.6 C) 98.5 F (36.9 C)  TempSrc: Oral   Oral  SpO2: 97% 95% 96% 97%  Weight:   73.2 kg   Height:        Intake/Output Summary (Last 24 hours) at 11/28/2023 1138 Last data filed at 11/28/2023 0500 Gross per 24 hour  Intake 920 ml  Output 1900 ml  Net -980 ml   Filed Weights   11/26/23 0400 11/27/23 0601 11/28/23 0550  Weight: 72 kg 72.3 kg 73.2 kg    Physical Exam   GEN: No acute distress.   Neck: No JVD. Cardiac: Irregularly irregular, 2/6 systolic murmur.  Respiratory: Nonlabored.  Coarse breath sounds with rhonchi, no crackles. GI: Soft, nontender, bowel sounds present. MS: No edema. Neuro:  Nonfocal. Psych: Alert and oriented x 3. Normal affect.  ECG/Telemetry    Telemetry shows slow atrial fibrillation as before, no pauses.  Labs    Chemistry Recent Labs  Lab 11/26/23 0403 11/27/23 0417 11/28/23 0413  NA 136 135 136  K 3.3* 3.9 3.7  CL 89* 92* 93*  CO2 35* 35* 33*  GLUCOSE 132* 97 86  BUN 105* 105* 102*  CREATININE 1.85* 1.81* 1.67*  CALCIUM  7.8* 7.7* 7.5*  GFRNONAA 36* 37* 41*  ANIONGAP 12 8 10     Hematology Recent Labs  Lab 11/23/23 0448 11/24/23 0547 11/27/23 0417  WBC 15.0* 13.3* 10.2  RBC 3.44* 3.55* 3.41*  HGB 9.7* 10.2* 9.7*  HCT 30.9* 32.3* 31.2*  MCV 89.8 91.0 91.5  MCH 28.2 28.7 28.4  MCHC 31.4 31.6 31.1  RDW 17.2* 17.7* 18.2*  PLT 369 371 292   Cardiac  Enzymes Recent Labs  Lab 11/13/23 1050 11/13/23 1305  TROPONINIHS 80* 88*   Lipid Panel     Component Value Date/Time   CHOL 98 05/14/2023 1110   TRIG 44 05/14/2023 1110   HDL 42 05/14/2023 1110   CHOLHDL 2.3 05/14/2023 1110   VLDL 9 05/14/2023 1110   LDLCALC 47 05/14/2023 1110    Cardiac Studies   Echocardiogram 11/19/2023:  1. Left ventricular ejection fraction, by estimation, is 65 to 70%. The  left ventricle has normal function. The left ventricle has no regional  wall motion abnormalities. Left ventricular diastolic parameters are  indeterminate.   2. Right ventricular systolic function is moderately reduced. The right  ventricular size is moderately enlarged. There is severely elevated  pulmonary artery systolic pressure.   3. Left atrial size was severely dilated.   4. Right atrial size was severely dilated.   5. A small pericardial effusion is present. The pericardial effusion is  circumferential.   6. The mitral valve is abnormal. No evidence of mitral valve  regurgitation. Mild mitral stenosis. The mean mitral valve gradient is 4.7  mmHg.Heart rate is 43 bpm.   7. The tricuspid valve is abnormal. Tricuspid valve regurgitation is  moderate.   8. The aortic valve was not well visualized. Aortic valve regurgitation  is not visualized. Mild aortic valve stenosis. Aortic valve area, by VTI  measures 2.34 cm. Aortic valve mean gradient measures 11.0 mmHg.   9. The inferior vena cava is dilated in size with <50% respiratory  variability, suggesting right atrial pressure of 15 mmHg.   Assessment & Plan   1.  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  Clinically improved with diuresis, recent ReDS vest 33 and weight down.  Net urine output of approximately 2000 cc since yesterday, transitioning to oral Demadex  80 mg in the morning and 40 mg in the evening today.  2.  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  He continues on Opsumit  and  tadalafil .  3.  Permanent atrial fibrillation with slow ventricular rate, not on any AV nodal blockers and not clearly symptomatic.  Continue Coumadin  for stroke prophylaxis.  4.  Community-acquired pneumonia, management per primary team and clinically improved having completed course of antibiotics.  Activity with assistance today to assess need for outpatient support, patient indicates that some of this is already in place.  May be able to look toward discharge tomorrow, follow-up on urine output and renal function in the morning.  Continue hydralazine , Lipitor, Farxiga , Opsumit , tadalafil , Coumadin , and Demadex .  For questions or updates, please contact [facility] Please consult [email or web address] for contact info under   Signed, Jayson Sierras, MD  11/28/2023, 11:38 AM

## 2023-11-28 NOTE — Progress Notes (Signed)
 Mobility Specialist Progress Note:    11/28/23 1355  Mobility  Activity Ambulated with assistance in hallway;Ambulated with assistance to bathroom;Transferred from chair to bed  Level of Assistance Contact guard assist, steadying assist  Assistive Device None  Distance Ambulated (ft) 80 ft  Range of Motion/Exercises Active;All extremities  Activity Response Tolerated well  Mobility Referral Yes  Mobility visit 1 Mobility  Mobility Specialist Start Time (ACUTE ONLY) 1320  Mobility Specialist Stop Time (ACUTE ONLY) 1355  Mobility Specialist Time Calculation (min) (ACUTE ONLY) 35 min   Pt received in chair, wife at bedside. Agreeable to mobility, required CGA to stand and ambulate with no AD. Tolerated well, resting HR 51 BPM. HR during ambulation 66 BPM. Pt desat, SpO2 83% on 1L during ambulation. Desat could be d/t anxiousness while holding back a BM in hallway. SpO2 96% on 4L. Returned to room, left pt supine. SpO2 92% on 1L. Alarm on, all needs met.   Sherrilee Ditty Mobility Specialist Please contact via Special Educational Needs Teacher or  Rehab office at [phone]

## 2023-11-29 DIAGNOSIS — I4891 Unspecified atrial fibrillation: Secondary | ICD-10-CM | POA: Diagnosis not present

## 2023-11-29 DIAGNOSIS — I5031 Acute diastolic (congestive) heart failure: Secondary | ICD-10-CM | POA: Diagnosis not present

## 2023-11-29 DIAGNOSIS — I272 Pulmonary hypertension, unspecified: Secondary | ICD-10-CM | POA: Diagnosis not present

## 2023-11-29 DIAGNOSIS — J9601 Acute respiratory failure with hypoxia: Secondary | ICD-10-CM | POA: Diagnosis not present

## 2023-11-29 LAB — MAGNESIUM: Magnesium: 2.7 mg/dL — ABNORMAL HIGH (ref 1.7–2.4)

## 2023-11-29 LAB — BASIC METABOLIC PANEL
Anion gap: 10 (ref 5–15)
BUN: 104 mg/dL — ABNORMAL HIGH (ref 8–23)
CO2: 33 mmol/L — ABNORMAL HIGH (ref 22–32)
Calcium: 7.4 mg/dL — ABNORMAL LOW (ref 8.9–10.3)
Chloride: 92 mmol/L — ABNORMAL LOW (ref 98–111)
Creatinine, Ser: 1.75 mg/dL — ABNORMAL HIGH (ref 0.61–1.24)
GFR, Estimated: 38 mL/min — ABNORMAL LOW (ref >60)
Glucose, Bld: 91 mg/dL (ref 70–99)
Potassium: 3.4 mmol/L — ABNORMAL LOW (ref 3.5–5.1)
Sodium: 135 mmol/L (ref 135–145)

## 2023-11-29 LAB — GLUCOSE, CAPILLARY
Glucose-Capillary: 116 mg/dL — ABNORMAL HIGH (ref 70–99)
Glucose-Capillary: 192 mg/dL — ABNORMAL HIGH (ref 70–99)

## 2023-11-29 LAB — PROTIME-INR
INR: 1.8 — ABNORMAL HIGH (ref 0.8–1.2)
Prothrombin Time: 21.3 s — ABNORMAL HIGH (ref 11.4–15.2)

## 2023-11-29 MED ORDER — POTASSIUM CHLORIDE CRYS ER 20 MEQ PO TBCR
40.0000 meq | EXTENDED_RELEASE_TABLET | Freq: Once | ORAL | Status: AC
  Administered 2023-11-29: 40 meq via ORAL
  Filled 2023-11-29: qty 2

## 2023-11-29 MED ORDER — WARFARIN SODIUM 7.5 MG PO TABS: 7.5000 mg | ORAL_TABLET | Freq: Once | ORAL | Status: DC

## 2023-11-29 NOTE — TOC Transition Note (Signed)
 Transition of Care [facility]) - Discharge Note   Patient Details  Name: Robert Baldwin MRN: [id number] Date of Birth: 09/24/1941  Transition of [facility] CM/SW Contact:  Sharlyne Stabs, RN Phone Number: 11/29/2023, 12:06 PM   Clinical Narrative:   Patient discharging home, with  [facility] home health. Cory updated. Patient is qualifying for home oxygen . CMS choices given. Order and notes in the chart.  Zach with [employer] accepted the referral and will deliver a tank to the room CM updated patient's son. Family at the bedside.     Final next level of care: Home w Home Health Services Barriers to Discharge: Barriers Resolved   Patient Goals and CMS Choice Patient states their goals for this hospitalization and ongoing recovery are:: return home   Choice offered to / list presented to : Spouse  ownership interest in [facility].provided to::  (n/a)    Discharge Placement                Patient to be transferred to facility by: family Name of family member notified: son Patient and family notified of of transfer: 11/29/23  Discharge Plan and Services Additional resources added to the After Visit Summary for   In-house Referral: Clinical Social Work              DME Arranged: Oxygen  DME Agency: [employer] Date DME Agency Contacted: 11/29/23 Time DME Agency Contacted: 1200 Representative spoke with at DME Agency: Darlyn HH Arranged: PT, Nurse's Aide HH Agency: [facility] Date [facility] Agency Contacted: 11/28/23 Time HH Agency Contacted: 1315 Representative spoke with at [facility] Agency: Darleene  Social Drivers of Health (SDOH) Interventions SDOH Screenings   Food Insecurity: No Food Insecurity (11/13/2023)  Housing: Low Risk  (11/13/2023)  Transportation Needs: No Transportation Needs (11/13/2023)  Utilities: Not At Risk (11/13/2023)  Social Connections: Socially Integrated (11/13/2023)  Tobacco Use: Low Risk  (11/13/2023)     Readmission Risk Interventions     11/27/2023    2:06 PM  Readmission Risk Prevention Plan  Transportation Screening Complete  HRI or Home Care Consult Complete  Social Work Consult for Recovery Care Planning/Counseling Complete  Palliative Care Screening Not Applicable  Medication Review Oceanographer) Complete

## 2023-11-29 NOTE — Progress Notes (Addendum)
 Patient Name: SAVYON LOKEN Date of Encounter: 11/29/2023 [facility] Cardiologist: Jayson Sierras, MD   Interval Summary  .    Patient's wife and son at the bedside as well. He reports his breathing has significantly improved. No chest pain or palpitations. Looking forward to going home today.   Vital Signs .    Vitals:   11/28/23 2104 11/29/23 0410 11/29/23 0500 11/29/23 0851  BP: (!) 118/34 (!) 134/43  (!) 126/36  Pulse: (!) 40     Resp:  20    Temp: 98.8 F (37.1 C) 97.8 F (36.6 C)    TempSrc: Oral     SpO2: 100% 97%    Weight:   72.9 kg   Height:        Intake/Output Summary (Last 24 hours) at 11/29/2023 1135 Last data filed at 11/29/2023 1051 Gross per 24 hour  Intake 720 ml  Output 1950 ml  Net -1230 ml      11/29/2023    5:00 AM 11/28/2023    5:50 AM 11/27/2023    6:01 AM  Last 3 Weights  Weight (lbs) 160 lb 12.8 oz 161 lb 6 oz 159 lb 6.3 oz  Weight (kg) 72.938 kg 73.2 kg 72.3 kg      Telemetry/ECG/Cardiac Studies:    Cardiac Telemetry: Atrial fibrillation with slow-ventricular response, HR in 30's to 40's.  - Personally Reviewed  Echocardiogram: 10/2023 IMPRESSIONS    1. Left ventricular ejection fraction, by estimation, is 65 to 70%. The  left ventricle has normal function. The left ventricle has no regional  wall motion abnormalities. Left ventricular diastolic parameters are  indeterminate.   2. Right ventricular systolic function is moderately reduced. The right  ventricular size is moderately enlarged. There is severely elevated  pulmonary artery systolic pressure.   3. Left atrial size was severely dilated.   4. Right atrial size was severely dilated.   5. A small pericardial effusion is present. The pericardial effusion is  circumferential.   6. The mitral valve is abnormal. No evidence of mitral valve  regurgitation. Mild mitral stenosis. The mean mitral valve gradient is 4.7  mmHg.Heart rate is 43 bpm.   7. The tricuspid valve is  abnormal. Tricuspid valve regurgitation is  moderate.   8. The aortic valve was not well visualized. Aortic valve regurgitation  is not visualized. Mild aortic valve stenosis. Aortic valve area, by VTI  measures 2.34 cm. Aortic valve mean gradient measures 11.0 mmHg.   9. The inferior vena cava is dilated in size with <50% respiratory  variability, suggesting right atrial pressure of 15 mmHg.    Physical Exam .    GEN: Pleasant male appearing in no acute distress.   Neck: No JVD Cardiac: Irregularly irregular, 2/6 systolic murmur along RUSB.  Respiratory: Clear to auscultation bilaterally. GI: Soft, nontender, non-distended  MS: No pitting edema  Assessment & Plan .    1. Acute HFpEF/RV Dysfunction - Repeat echo this admission shows a preserved EF of 60-65% with moderate RV dysfunction and severe pulmonary HTN with PASP at 76 mmHg.  - Responded well to IV Lasix  this admission with an overall net output of -13.1 L and weight down from a peak of 176 lbs to 160 lbs. Has now been transitioned back to PO Torsemide  80mg  in AM/40mg  in PM. Also on Farxiga  10mg  daily. Will need a repeat BMET at follow-up.    2.  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 Continue Opsumit  and Tadalafil  at prior to admission doses. Already has previously scheduled follow-up with Dr. Rolan on 12/18/2023.   3. Permanent Atrial Fibrillation - Not on AV nodal blocking agents given slow-ventricular response. Plan was for outpatient EP evaluation but consult not formally entered. Discussed with Dr. Debera and no plans for EP evaluation at this time given his asymptomatic state. Can readdress if he develops symptoms in the future.  - INR at 1.8 today. Coumadin  dosing per pharmacy.    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 Peaked at 2.78 this admission and overall stable at 1.75 today.    5. CAP - WBC peaked at 38.3 on admission and has now  normalized. Completed course of antibiotics.    6. Hypokalemia - K+ was at 3.4 this morning and he already received supplementation by the admitting team.   For questions or updates, please contact [facility] Please consult [email or web address] for contact info under        Signed, Laymon CHRISTELLA Qua, PA-C    Attending note:  Patient seen and examined.  Discussed with family members present as well.  I agree with above assessment by Ms. Strader PA-C.  Patient is tolerating oral diuretics at this time and has ambulated in the hall.  Plan is for discharge home today, he already has follow-up scheduled with Dr. Rolan on February 26.  Plans to go home on at least a limited course of supplemental oxygen .  Would continue Demadex  80 mg in the morning and 40 mg in the afternoon.  He is also on Farxiga , Lipitor, Opsumit , and tadalafil  along with Coumadin .  No AV nodal blockers with chronic bradycardia and sinus node dysfunction in the setting of permanent atrial fibrillation.  Would hold off on EP consultation, he has been bradycardic for quite some time and not definitively symptomatic particularly in light of his other comorbidities.  Jayson JUDITHANN Debera, M.D., F.A.C.C.

## 2023-11-29 NOTE — Discharge Summary (Addendum)
 Physician Discharge Summary   Patient: Robert Baldwin MRN: [id number] DOB: 09-10-1941  Admit date:     11/13/2023  Discharge date: 11/29/23  Discharge Physician: Alm Joelyn Lover   PCP: Maree Isles, MD   Recommendations at discharge:   Please follow up with primary care provider within 1-2 weeks  Please repeat BMP and CBC in one week  Hospital Course: 83 y.o. male with medical history significant for type 2 diabetes, hypertension, atrial fibrillation on Coumadin , dyslipidemia, HFpEF, pulmonary hypertension, and CKD stage IIIb-IV, who presented to the ED with worsening cough and shortness of breath along with generalized weakness.  Patient was admitted with acute hypoxemic respiratory failure secondary to multifocal community-acquired pneumonia and was started on empiric antibiotics.  He has now completed course of antibiotics and is currently experiencing acute HFpEF with RV dysfunction and has been started on aggressive IV Lasix  for diuresis.  Cardiology consulted for assistance in management.  He improved clinically and he was ultimately transitioned to po torsemide  on 11/28/23, 80 mg AM and 40 mg PM and remained on Farxiga .  He remained stable and was cleared for d/c by cardiology on 2/7.  He will d/c with 2L [facility]    Assessment and Plan: Acute hypoxemic respiratory failure  -due to pneumonia and CHF -now stable on 2L -wean oxygen  as tolerated for saturation >92% -desaturated with ambulation on RA -plan to go home with 2L [place]   Lobar pneumonia -completed one week abx and steroids -COVID/RSV and Flu neg -blood cultures neg -stable off abx   acute HFpEF with RV dysfunction -Appreciate cardiology assistance with 2D echocardiogram ordered-noted below LVEF 65-70% indeterminate diastolic parameters, Right ventricular systolic function is moderately reduced. The right ventricular size is moderately enlarged. There is severely elevated pulmonary artery systolic pressure , mild aortic valve stenosis -IV  lasix >>torsemide  on 2/6 -continue Farxiga  -Neg 13.1L -ReDS 33 on 2/4 -discharge weight 160.8 lbs -admission weight 176.4 -d/c home with torsemide  80 mg AM and 40 mg PM   Transaminitis with alkaline phosphatase elevation-resolved -Acute hepatitis panel negative -Ultrasound abdomen without any significant findings -atorvastatin  resumed   Normocytic Anemia -No overt bleeding noted, continue to follow trend with stabilization noted -INR being monitored closely, continue to monitor   Pulmonary HTN -11/19/23--Echo EF 65-70%, mod reduced RVF; RVSP 76.2 -Continue tadalafil  and Opsumit  -follow up advanced HF clinic   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 07/2022 -resumed atorvastatin    Type 2 diabetes with hyperglycemia Uncontrolled with neurological complications -Continue SGLT2i -Continue carb modified diet -Added SSI during hospitalization -added semglee  and prandial novolog  coverage -steroid induced hyperglycemia - improved off steroids   Permanent atrial fibrillation -Bradycardic and not on AV nodal blockade agents -Currently on warfarin with pharm D managing inpatient dosing -Vitamin K given 1/24 for supratherapeutic PT - Dr. Okey reviewed with EP earlier this week and he did ambulate and HR increased into the 60's and he denied any associated symptoms. Plans are for follow-up with EP as an outpatient.    Bradycardia  -cardiology requested to ambulate patient today and if he does not have symptoms he could follow up with EP as an outpatient.   -If he becomes symptomatic when ambulating with him he would need more urgent EP evaluation per cardiology team  -PT eval requested to start ambulating which was done 2/4 and he did fairly well given his bradycardia and was mostly asymptomatic.    OSA on CPAP -Continue CPAP at night   CKD stage IIIB -Baseline creatinine 1.7-2.0 -  Continue to monitor daily with aggressive diuresis -serum creatinine 1.75 on day of dc    Dyslipidemia -Resumed atorvastatin    Hypertension -Continue hydralazine   -holding on home amlodipine  to allow BP margin for diuresis -BP remains controlled off of amlodipine    Hypokalemia -repleted      Consultants: cardiology Procedures performed: none  Disposition: Home Diet recommendation:  Cardiac and Carb modified diet DISCHARGE MEDICATION: Allergies as of 11/29/2023       Reactions   Other Rash   Bandaids        Medication List     STOP taking these medications    amLODipine  10 MG tablet Commonly known as: NORVASC    benzonatate 200 MG capsule Commonly known as: TESSALON   predniSONE  5 MG (21) Tbpk tablet Commonly known as: STERAPRED UNI-PAK 21 TAB       TAKE these medications    atorvastatin  40 MG tablet Commonly known as: LIPITOR Take 40 mg by mouth at bedtime.   cholecalciferol  25 MCG (1000 UNIT) tablet Commonly known as: VITAMIN D3 Take 1,000 Units by mouth daily.   Farxiga  10 MG Tabs tablet Generic drug: dapagliflozin  propanediol TAKE 1 TABLET BY MOUTH DAILY   fish oil-omega-3 fatty acids  1000 MG capsule Take 1 g by mouth every evening.   Glucos-Chondroit-MSM Complex Tabs Take 2 tablets by mouth daily.   hydrALAZINE  25 MG tablet Commonly known as: APRESOLINE  Take 1 tablet (25 mg total) by mouth 3 (three) times daily. What changed: when to take this   multivitamin with minerals tablet Take 1 tablet by mouth in the morning.  Centrum Silver   niacin  500 MG tablet Commonly known as: VITAMIN B3 Take 500 mg by mouth in the morning.   Opsumit  10 MG tablet Generic drug: macitentan  TAKE 1 TABLET BY MOUTH DAILY   potassium chloride  10 MEQ tablet Commonly known as: KLOR-CON  M Take 10 mEq by mouth daily.   tadalafil  20 MG tablet Commonly known as: CIALIS  TAKE 2 TABLETS BY MOUTH DAILY What changed:  how much to take when to take this   torsemide  20 MG tablet Commonly known as: DEMADEX  Take 4 tablets (80 mg total) by mouth  every morning AND 2 tablets (40 mg total) every evening.   warfarin 5 MG tablet Commonly known as: COUMADIN  Take 5-7.5 mg by mouth See admin instructions. Take 5 mg on Saturday,Sunday Tues, Wed, Thursday, and then  Take 7.5 mg on Monday and Friday               Durable Medical Equipment  (From admission, onward)           Start     Ordered   11/28/23 1633  For home use only DME oxygen  Once       Comments: POC for CHF  Question Answer Comment  Length of Need 12 Months   Mode or (Route) Nasal cannula   Liters per Minute 2   Frequency Continuous (stationary and portable oxygen unit needed)   Oxygen conserving device Yes   Oxygen delivery system Gas      02 /06/25 1632            Follow-up Information     [facility] Follow up.   Specialty: Home Health Services Why: PT/Aide will call to sche Contact information: [address] [place] [address] [phone]         Rolan Ezra RAMAN, MD Follow up.   Specialty: Cardiology Why: Keep scheduled follow-up for  12/18/2023 at 9:00 AM. Contact information: [address] [place] [address] [phone]                Discharge Exam: Fredricka Weights   11/27/23 0601 11/28/23 0550 11/29/23 0500  Weight: 72.3 kg 73.2 kg 72.9 kg   HEENT:  [place]/AT, No thrush, no icterus CV:  IRRR, no rub, no S3, no S4 Lung:  fine basilar rales. No wheeze Abd:  soft/+BS, NT Ext:  No edema, no lymphangitis, no synovitis, no rash   Condition at discharge: stable  The results of significant diagnostics from this hospitalization (including imaging, microbiology, ancillary and laboratory) are listed below for reference.   Imaging Studies: ECHOCARDIOGRAM COMPLETE Result Date: 11/19/2023    ECHOCARDIOGRAM REPORT   Patient Name:   LOYCE KLASEN Date of Exam: 11/19/2023 Medical Rec #:  [id number]   Height:       67.0 in Accession #:    [id number]  Weight:       171.3 lb Date of Birth:  Jan 18, 1941   BSA:           1.893 m Patient Age:    82 years    BP:           153/42 mmHg Patient Gender: M           HR:           41 bpm. Exam Location:  [facility] Procedure: 2D Echo, Color Doppler and Cardiac Doppler Indications:    R06.9 DOE  History:        Patient has prior history of Echocardiogram examinations, most                 recent 05/14/2023. Pulmonary HTN, Arrythmias:Atrial Fibrillation;                 Risk Factors:Hypertension, Diabetes and Dyslipidemia.  Sonographer:    Damien Senior RDCS Referring Phys: [id number] DORN FALCON BRANCH IMPRESSIONS  1. Left ventricular ejection fraction, by estimation, is 65 to 70%. The left ventricle has normal function. The left ventricle has no regional wall motion abnormalities. Left ventricular diastolic parameters are indeterminate.  2. Right ventricular systolic function is moderately reduced. The right ventricular size is moderately enlarged. There is severely elevated pulmonary artery systolic pressure.  3. Left atrial size was severely dilated.  4. Right atrial size was severely dilated.  5. A small pericardial effusion is present. The pericardial effusion is circumferential.  6. The mitral valve is abnormal. No evidence of mitral valve regurgitation. Mild mitral stenosis. The mean mitral valve gradient is 4.7 mmHg.Heart rate is 43 bpm.  7. The tricuspid valve is abnormal. Tricuspid valve regurgitation is moderate.  8. The aortic valve was not well visualized. Aortic valve regurgitation is not visualized. Mild aortic valve stenosis. Aortic valve area, by VTI measures 2.34 cm. Aortic valve mean gradient measures 11.0 mmHg.  9. The inferior vena cava is dilated in size with <50% respiratory variability, suggesting right atrial pressure of 15 mmHg. FINDINGS  Left Ventricle: Left ventricular ejection fraction, by estimation, is 65 to 70%. The left ventricle has normal function. The left ventricle has no regional wall motion abnormalities. The left ventricular internal cavity size  was normal in size. There is  no left ventricular hypertrophy. Left ventricular diastolic parameters are indeterminate. Right Ventricle: The right ventricular size is moderately enlarged. Right vetricular wall thickness was not well visualized. Right ventricular systolic function is moderately reduced. There is severely elevated pulmonary artery  systolic pressure. The tricuspid regurgitant velocity is 3.91 m/s, and with an assumed right atrial pressure of 15 mmHg, the estimated right ventricular systolic pressure is 76.2 mmHg. Left Atrium: Left atrial size was severely dilated. Right Atrium: Right atrial size was severely dilated. Pericardium: A small pericardial effusion is present. The pericardial effusion is circumferential. Mitral Valve: The mitral valve is abnormal. No evidence of mitral valve regurgitation. Mild mitral valve stenosis. MV peak gradient, 26.3 mmHg. The mean mitral valve gradient is 4.7 mmHg. Tricuspid Valve: The tricuspid valve is abnormal. Tricuspid valve regurgitation is moderate . No evidence of tricuspid stenosis. Aortic Valve: The aortic valve was not well visualized. Aortic valve regurgitation is not visualized. Mild aortic stenosis is present. Aortic valve mean gradient measures 11.0 mmHg. Aortic valve peak gradient measures 26.2 mmHg. Aortic valve area, by VTI  measures 2.34 cm. Pulmonic Valve: The pulmonic valve was not well visualized. Pulmonic valve regurgitation is not visualized. No evidence of pulmonic stenosis. Aorta: The aortic root and ascending aorta are structurally normal, with no evidence of dilitation. Venous: The inferior vena cava is dilated in size with less than 50% respiratory variability, suggesting right atrial pressure of 15 mmHg. IAS/Shunts: No atrial level shunt detected by color flow Doppler.  LEFT VENTRICLE PLAX 2D LVIDd:         5.00 cm LVIDs:         3.30 cm LV PW:         1.00 cm LV IVS:        0.90 cm LVOT diam:     2.10 cm LV SV:         144 LV SV Index:    76 LVOT Area:     3.46 cm  RIGHT VENTRICLE RV S prime:     11.50 cm/s TAPSE (M-mode): 2.8 cm LEFT ATRIUM              Index        RIGHT ATRIUM           Index LA diam:        6.20 cm  3.27 cm/m   RA Area:     40.20 cm LA Vol (A2C):   114.0 ml 60.21 ml/m  RA Volume:   163.00 ml 86.09 ml/m LA Vol (A4C):   130.0 ml 68.66 ml/m LA Biplane Vol: 124.0 ml 65.49 ml/m  AORTIC VALVE AV Area (Vmax):    2.07 cm AV Area (Vmean):   2.50 cm AV Area (VTI):     2.34 cm AV Vmax:           256.00 cm/s AV Vmean:          151.500 cm/s AV VTI:            0.613 m AV Peak Grad:      26.2 mmHg AV Mean Grad:      11.0 mmHg LVOT Vmax:         153.00 cm/s LVOT Vmean:        109.500 cm/s LVOT VTI:          0.415 m LVOT/AV VTI ratio: 0.68  AORTA Ao Root diam: 3.00 cm Ao Asc diam:  3.20 cm MITRAL VALVE             TRICUSPID VALVE MV Area VTI:  1.84 cm   TR Peak grad:   61.2 mmHg MV Peak grad: 26.3 mmHg  TR Vmax:        391.00 cm/s MV Mean  grad: 4.7 mmHg MV Vmax:      2.56 m/s   SHUNTS MV Vmean:     84.5 cm/s  Systemic VTI:  0.42 m                          Systemic Diam: 2.10 cm Dorn Ross MD Electronically signed by Dorn Ross MD Signature Date/Time: 11/19/2023/3:34:23 PM    Final    DG CHEST PORT 1 VIEW Result Date: 11/15/2023 CLINICAL DATA:  Dyspnea EXAM: PORTABLE CHEST 1 VIEW COMPARISON:  11/13/2023 FINDINGS: Cardiomegaly with vascular congestion and small right effusion. Multifocal airspace opacities suggestive of pneumonia. No pneumothorax IMPRESSION: Cardiomegaly with vascular congestion and small right effusion. Multifocal airspace opacities suggestive of pneumonia. Electronically Signed   By: Luke Bun M.D.   On: 11/15/2023 19:55   US  Abdomen Limited RUQ (LIVER/GB) Result Date: 11/15/2023 CLINICAL DATA:  Transaminitis EXAM: ULTRASOUND ABDOMEN LIMITED RIGHT UPPER QUADRANT COMPARISON:  None Available. FINDINGS: Gallbladder: Gallbladder is underdistended. Shadowing stones. No adjacent fluid. Common bile duct:  Diameter: 3 mm Liver: Slightly heterogeneous echogenic liver. Portal vein is patent on color Doppler imaging with normal direction of blood flow towards the liver. Other: None. IMPRESSION: Gallbladder is underdistended. There is some shadowing stones. No reported Murphy sign. No ductal dilatation. Slightly heterogeneous and nodular liver. Electronically Signed   By: Ranell Bring M.D.   On: 11/15/2023 10:39   CT Chest Wo Contrast Result Date: 11/13/2023 CLINICAL DATA:  Respiratory illness.  Hypoxia. EXAM: CT CHEST WITHOUT CONTRAST TECHNIQUE: Multidetector CT imaging of the chest was performed following the standard protocol without IV contrast. RADIATION DOSE REDUCTION: This exam was performed according to the departmental dose-optimization program which includes automated exposure control, adjustment of the mA and/or kV according to patient size and/or use of iterative reconstruction technique. COMPARISON:  Chest x-ray from same day. CT chest dated May 03, 2022. FINDINGS: Cardiovascular: Unchanged cardiomegaly and small pericardial effusion. No thoracic aortic aneurysm. Coronary, aortic arch, and branch vessel atherosclerotic vascular disease. Mediastinum/Nodes: Prominent mediastinal lymph nodes measuring up to 1.4 cm in short axis, increased in size since the prior study. No enlarged axillary lymph nodes. Thyroid gland, trachea, and esophagus demonstrate no significant findings. Lungs/Pleura: Trace bilateral pleural effusions, slightly larger on the left. Extensive and patchy consolidative opacities throughout both lungs. No pneumothorax. Upper Abdomen: No acute abnormality. Unchanged nodular liver contour. Musculoskeletal: No acute or significant osseous findings. IMPRESSION: 1. Extensive and patchy consolidative opacities throughout both lungs, consistent with multifocal pneumonia. 2. Trace bilateral pleural effusions. 3. Unchanged cardiomegaly and small pericardial effusion. 4.  Aortic Atherosclerosis  (ICD10-I70.0). Electronically Signed   By: Elsie ONEIDA Shoulder M.D.   On: 11/13/2023 13:07   DG Chest Port 1 View Result Date: 11/13/2023 CLINICAL DATA:  Shortness of breath with hypoxia and low blood pressure. EXAM: PORTABLE CHEST 1 VIEW COMPARISON:  05/01/2022 FINDINGS: Low volume film. The cardio pericardial silhouette is enlarged. Diffuse interstitial opacity is similar to mildly progressive in the interval and a component of interstitial edema is not excluded. Probable tiny bilateral pleural effusions. No acute bony abnormality. Telemetry leads overlie the chest. IMPRESSION: Low volume film with diffuse interstitial opacity, similar to mildly progressive in the interval. A component of interstitial edema is not excluded. Electronically Signed   By: Camellia Candle M.D.   On: 11/13/2023 11:15    Microbiology: Results for orders placed or performed during the hospital encounter of 11/13/23  Resp panel by RT-PCR (RSV, Flu  A&B, Covid) Anterior Nasal Swab     Status: None   Collection Time: 11/13/23 10:25 AM   Specimen: Anterior Nasal Swab  Result Value Ref Range Status   SARS Coronavirus 2 by RT PCR NEGATIVE NEGATIVE Final    Comment: (NOTE) SARS-CoV-2 target nucleic acids are NOT DETECTED.  The SARS-CoV-2 RNA is generally detectable in upper respiratory specimens during the acute phase of infection. The lowest concentration of SARS-CoV-2 viral copies this assay can detect is 138 copies/mL. A negative result does not preclude SARS-Cov-2 infection and should not be used as the sole basis for treatment or other patient management decisions. A negative result may occur with  improper specimen collection/handling, submission of specimen other than nasopharyngeal swab, presence of viral mutation(s) within the areas targeted by this assay, and inadequate number of viral copies(<138 copies/mL). A negative result must be combined with clinical observations, patient history, and  epidemiological information. The expected result is Negative.  Fact Sheet for Patients:  bloggercourse.com  Fact Sheet for Healthcare Providers:  seriousbroker.it  This test is no t yet approved or cleared by the [place]  FDA and  has been authorized for detection and/or diagnosis of SARS-CoV-2 by FDA under an Emergency Use Authorization (EUA). This EUA will remain  in effect (meaning this test can be used) for the duration of the COVID-19 declaration under Section 564(b)(1) of the Act, 21 U.S.C.section 360bbb-3(b)(1), unless the authorization is terminated  or revoked sooner.       Influenza A by PCR NEGATIVE NEGATIVE Final   Influenza B by PCR NEGATIVE NEGATIVE Final    Comment: (NOTE) The Xpert Xpress SARS-CoV-2/FLU/RSV plus assay is intended as an aid in the diagnosis of influenza from Nasopharyngeal swab specimens and should not be used as a sole basis for treatment. Nasal washings and aspirates are unacceptable for Xpert Xpress SARS-CoV-2/FLU/RSV testing.  Fact Sheet for Patients: bloggercourse.com  Fact Sheet for Healthcare Providers: seriousbroker.it  This test is not yet approved or cleared by the [place]  FDA and has been authorized for detection and/or diagnosis of SARS-CoV-2 by FDA under an Emergency Use Authorization (EUA). This EUA will remain in effect (meaning this test can be used) for the duration of the COVID-19 declaration under Section 564(b)(1) of the Act, 21 U.S.C. section 360bbb-3(b)(1), unless the authorization is terminated or revoked.     Resp Syncytial Virus by PCR NEGATIVE NEGATIVE Final    Comment: (NOTE) Fact Sheet for Patients: bloggercourse.com  Fact Sheet for Healthcare Providers: seriousbroker.it  This test is not yet approved or cleared by the [place]  FDA and has been  authorized for detection and/or diagnosis of SARS-CoV-2 by FDA under an Emergency Use Authorization (EUA). This EUA will remain in effect (meaning this test can be used) for the duration of the COVID-19 declaration under Section 564(b)(1) of the Act, 21 U.S.C. section 360bbb-3(b)(1), unless the authorization is terminated or revoked.  Performed at [facility], [address] [place], [place] [address]   Blood culture (routine x 2)     Status: None   Collection Time: 11/13/23  1:05 PM   Specimen: BLOOD  Result Value Ref Range Status   Specimen Description BLOOD BLOOD LEFT HAND  Final   Special Requests   Final    Blood Culture results may not be optimal due to an inadequate volume of blood received in culture bottles BOTTLES DRAWN AEROBIC AND ANAEROBIC   Culture   Final    NO GROWTH 5 DAYS Performed at [facility], [address] [place], [place] [address]    Report Status 11/18/2023 FINAL  Final  Blood culture (routine x 2)     Status: None   Collection Time: 11/13/23  1:05 PM   Specimen: BLOOD  Result Value Ref Range Status   Specimen Description BLOOD BLOOD RIGHT HAND  Final   Special Requests   Final    Blood Culture results may not be optimal due to an inadequate volume of blood received in culture bottles BOTTLES DRAWN AEROBIC ONLY   Culture   Final    NO GROWTH 5 DAYS Performed at [facility], [address] [place], [place] [address]    Report Status 11/18/2023 FINAL  Final    Labs: CBC: Recent Labs  Lab 11/23/23 0448 11/24/23 0547 11/27/23 0417  WBC 15.0* 13.3* 10.2  NEUTROABS 12.1* 10.7*  --   HGB 9.7* 10.2* 9.7*  HCT 30.9* 32.3* 31.2*  MCV 89.8 91.0 91.5  PLT 369 371 292   Basic Metabolic Panel: Recent Labs  Lab 11/23/23 0448 11/24/23 0547 11/25/23 0437 11/26/23 0403 11/27/23 0417 11/28/23 0413 11/29/23 0404  NA 136   < > 138 136 135 136 135  K 4.1   < > 3.6 3.3* 3.9 3.7 3.4*  CL 90*   < > 91* 89* 92* 93* 92*  CO2 37*   < > 37* 35* 35* 33*  33*  GLUCOSE 89   < > 124* 132* 97 86 91  BUN 118*   < > 106* 105* 105* 102* 104*  CREATININE 1.76*   < > 1.88* 1.85* 1.81* 1.67* 1.75*  CALCIUM  8.2*   < > 8.3* 7.8* 7.7* 7.5* 7.4*  MG 2.7*  --   --  2.9* 2.8* 2.7* 2.7*   < > = values in this interval not displayed.   Liver Function Tests: No results for input(s): AST, ALT, ALKPHOS, BILITOT, PROT, ALBUMIN  in the last 168 hours. CBG: Recent Labs  Lab 11/28/23 1116 11/28/23 1607 11/28/23 2112 11/29/23 0733 11/29/23 1131  GLUCAP 117* 155* 156* 116* 192*    Discharge time spent: greater than 30 minutes.  Signed: Alm Schneider, MD [facility] 11/29/2023

## 2023-11-29 NOTE — Progress Notes (Signed)
 PHARMACY - ANTICOAGULATION CONSULT NOTE  Pharmacy Consult for warfarin Indication: atrial fibrillation  Allergies  Allergen Reactions   Other Rash    Bandaids    Patient Measurements: Height: 5' 7 (170.2 cm) Weight: 72.9 kg (160 lb 12.8 oz) IBW/kg (Calculated) : 66.1  Vital Signs: Temp: 97.8 F (36.6 C) (02/07 0410) Temp Source: Oral (02/06 2104) BP: 134/43 (02/07 0410) Pulse Rate: 40 (02/06 2104)  Labs: Recent Labs    11/27/23 0417 11/28/23 0413 11/29/23 0404  HGB 9.7*  --   --   HCT 31.2*  --   --   PLT 292  --   --   LABPROT 22.7* 20.7* 21.3*  INR 2.0* 1.8* 1.8*  CREATININE 1.81* 1.67* 1.75*    Estimated Creatinine Clearance: 30.4 mL/min (A) (by C-G formula based on SCr of 1.75 mg/dL (H)).   Medical History: Past Medical History:  Diagnosis Date   (HFpEF) heart failure with preserved ejection fraction (HCC)    Arthritis    Atrial fibrillation (HCC)    Bradycardia    Carotid artery disease (HCC)    Hyperlipidemia    Hypertension    Pulmonary hypertension (HCC)    Type 2 diabetes mellitus (HCC)     Assessment: 83 year old male with known afib on chronic warfarin therapy. INR elevated initially at 7.2. Hemoglobin 10.2>9.2>8.5> 9.7, no bleeding issues noted.   Home dose listed as 7.5 mg on Mon + Fri and 5 mg ROW.   INR  down again to 1.8 subtherapeutic today.  Vitamin K 5mg  po x 1 dose 1/24  Goal of Therapy:  INR 2-3 Monitor platelets by anticoagulation protocol: Yes   Plan:  Warfarin 7.5 mg x 1 today Daily INR for now  Elspeth Sour, PharmD Clinical Pharmacist 11/29/2023 8:45 AM

## 2023-11-29 NOTE — Care Management Important Message (Signed)
 Important Message  Patient Details  Name: Robert Baldwin MRN: [id number] Date of Birth: 30-Nov-1940   Important Message Given:  Yes - Medicare IM     Robert Baldwin 11/29/2023, 11:49 AM

## 2023-12-02 ENCOUNTER — Telehealth: Payer: Self-pay | Admitting: *Deleted

## 2023-12-02 DIAGNOSIS — E1169 Type 2 diabetes mellitus with other specified complication: Secondary | ICD-10-CM | POA: Diagnosis not present

## 2023-12-02 DIAGNOSIS — I4891 Unspecified atrial fibrillation: Secondary | ICD-10-CM | POA: Diagnosis not present

## 2023-12-02 DIAGNOSIS — N184 Chronic kidney disease, stage 4 (severe): Secondary | ICD-10-CM | POA: Diagnosis not present

## 2023-12-02 DIAGNOSIS — Z299 Encounter for prophylactic measures, unspecified: Secondary | ICD-10-CM | POA: Diagnosis not present

## 2023-12-02 DIAGNOSIS — J189 Pneumonia, unspecified organism: Secondary | ICD-10-CM | POA: Diagnosis not present

## 2023-12-02 NOTE — Progress Notes (Signed)
Complex Care Management Note Care Guide Note  12/02/2023 Name: Robert Baldwin MRN: [id number] DOB: 10-07-1941   Complex Care Management Outreach Attempts: An unsuccessful telephone outreach was attempted today to offer the patient information about available complex care management services.  Follow Up Plan:  Additional outreach attempts will be made to offer the patient complex care management information and services.   Encounter Outcome:  No Answer  Gwenevere Ghazi  [facility]  [facility], [facility] Guide  Direct Dial: [phone]  Fax [phone]

## 2023-12-06 NOTE — Progress Notes (Signed)
Complex Care Management Note  Care Guide Note 12/06/2023 Name: Robert Baldwin MRN: [id number] DOB: 06-02-1941  Robert Baldwin is a 83 y.o. year old male who sees Kirstie Peri, MD for primary care. I reached out to Audry Riles by phone today to offer complex care management services.  [name] spouse [name] was was given information about Complex Care Management services today including:   The Complex Care Management services include support from the care team which includes your Nurse Care Manager, Clinical Social Worker, or Pharmacist.  The Complex Care Management team is here to help remove barriers to the health concerns and goals most important to you. Complex Care Management services are voluntary, and the patient may decline or stop services at any time by request to their care team member.   Complex Care Management Consent Status: Patient spouse [name]  did not agree to participate in complex care management services at this time.    Encounter Outcome:  Patient Refused  Gwenevere Ghazi  [facility]  [facility], [facility] Guide  Direct Dial: [phone]  Fax [phone]

## 2023-12-09 DIAGNOSIS — I130 Hypertensive heart and chronic kidney disease with heart failure and stage 1 through stage 4 chronic kidney disease, or unspecified chronic kidney disease: Secondary | ICD-10-CM | POA: Diagnosis not present

## 2023-12-09 DIAGNOSIS — N184 Chronic kidney disease, stage 4 (severe): Secondary | ICD-10-CM | POA: Diagnosis not present

## 2023-12-09 DIAGNOSIS — J9601 Acute respiratory failure with hypoxia: Secondary | ICD-10-CM | POA: Diagnosis not present

## 2023-12-09 DIAGNOSIS — I4821 Permanent atrial fibrillation: Secondary | ICD-10-CM | POA: Diagnosis not present

## 2023-12-09 DIAGNOSIS — E1165 Type 2 diabetes mellitus with hyperglycemia: Secondary | ICD-10-CM | POA: Diagnosis not present

## 2023-12-09 DIAGNOSIS — I2721 Secondary pulmonary arterial hypertension: Secondary | ICD-10-CM | POA: Diagnosis not present

## 2023-12-09 DIAGNOSIS — J189 Pneumonia, unspecified organism: Secondary | ICD-10-CM | POA: Diagnosis not present

## 2023-12-09 DIAGNOSIS — E1122 Type 2 diabetes mellitus with diabetic chronic kidney disease: Secondary | ICD-10-CM | POA: Diagnosis not present

## 2023-12-09 DIAGNOSIS — I5033 Acute on chronic diastolic (congestive) heart failure: Secondary | ICD-10-CM | POA: Diagnosis not present

## 2023-12-12 DIAGNOSIS — I130 Hypertensive heart and chronic kidney disease with heart failure and stage 1 through stage 4 chronic kidney disease, or unspecified chronic kidney disease: Secondary | ICD-10-CM | POA: Diagnosis not present

## 2023-12-12 DIAGNOSIS — I5033 Acute on chronic diastolic (congestive) heart failure: Secondary | ICD-10-CM | POA: Diagnosis not present

## 2023-12-12 DIAGNOSIS — J9601 Acute respiratory failure with hypoxia: Secondary | ICD-10-CM | POA: Diagnosis not present

## 2023-12-12 DIAGNOSIS — J189 Pneumonia, unspecified organism: Secondary | ICD-10-CM | POA: Diagnosis not present

## 2023-12-12 DIAGNOSIS — E1122 Type 2 diabetes mellitus with diabetic chronic kidney disease: Secondary | ICD-10-CM | POA: Diagnosis not present

## 2023-12-12 DIAGNOSIS — I4821 Permanent atrial fibrillation: Secondary | ICD-10-CM | POA: Diagnosis not present

## 2023-12-12 DIAGNOSIS — I2721 Secondary pulmonary arterial hypertension: Secondary | ICD-10-CM | POA: Diagnosis not present

## 2023-12-12 DIAGNOSIS — E1165 Type 2 diabetes mellitus with hyperglycemia: Secondary | ICD-10-CM | POA: Diagnosis not present

## 2023-12-12 DIAGNOSIS — N184 Chronic kidney disease, stage 4 (severe): Secondary | ICD-10-CM | POA: Diagnosis not present

## 2023-12-16 DIAGNOSIS — N184 Chronic kidney disease, stage 4 (severe): Secondary | ICD-10-CM | POA: Diagnosis not present

## 2023-12-16 DIAGNOSIS — I5033 Acute on chronic diastolic (congestive) heart failure: Secondary | ICD-10-CM | POA: Diagnosis not present

## 2023-12-16 DIAGNOSIS — J189 Pneumonia, unspecified organism: Secondary | ICD-10-CM | POA: Diagnosis not present

## 2023-12-16 DIAGNOSIS — I2721 Secondary pulmonary arterial hypertension: Secondary | ICD-10-CM | POA: Diagnosis not present

## 2023-12-16 DIAGNOSIS — E1165 Type 2 diabetes mellitus with hyperglycemia: Secondary | ICD-10-CM | POA: Diagnosis not present

## 2023-12-16 DIAGNOSIS — E1122 Type 2 diabetes mellitus with diabetic chronic kidney disease: Secondary | ICD-10-CM | POA: Diagnosis not present

## 2023-12-16 DIAGNOSIS — J9601 Acute respiratory failure with hypoxia: Secondary | ICD-10-CM | POA: Diagnosis not present

## 2023-12-16 DIAGNOSIS — I130 Hypertensive heart and chronic kidney disease with heart failure and stage 1 through stage 4 chronic kidney disease, or unspecified chronic kidney disease: Secondary | ICD-10-CM | POA: Diagnosis not present

## 2023-12-16 DIAGNOSIS — I4821 Permanent atrial fibrillation: Secondary | ICD-10-CM | POA: Diagnosis not present

## 2023-12-18 ENCOUNTER — Encounter (HOSPITAL_COMMUNITY): Payer: Self-pay | Admitting: Cardiology

## 2023-12-18 ENCOUNTER — Ambulatory Visit (HOSPITAL_COMMUNITY)
Admission: RE | Admit: 2023-12-18 | Discharge: 2023-12-18 | Disposition: A | Discharge status: Home / Self Care | Payer: Medicare PPO | Source: Ambulatory Visit | Attending: Cardiology | Admitting: Cardiology

## 2023-12-18 VITALS — BP 140/70 | HR 51 | Wt 170.0 lb

## 2023-12-18 DIAGNOSIS — I080 Rheumatic disorders of both mitral and aortic valves: Secondary | ICD-10-CM | POA: Insufficient documentation

## 2023-12-18 DIAGNOSIS — N1832 Chronic kidney disease, stage 3b: Secondary | ICD-10-CM | POA: Insufficient documentation

## 2023-12-18 DIAGNOSIS — Z79899 Other long term (current) drug therapy: Secondary | ICD-10-CM | POA: Insufficient documentation

## 2023-12-18 DIAGNOSIS — E1122 Type 2 diabetes mellitus with diabetic chronic kidney disease: Secondary | ICD-10-CM | POA: Insufficient documentation

## 2023-12-18 DIAGNOSIS — Z7984 Long term (current) use of oral hypoglycemic drugs: Secondary | ICD-10-CM | POA: Insufficient documentation

## 2023-12-18 DIAGNOSIS — Z7901 Long term (current) use of anticoagulants: Secondary | ICD-10-CM | POA: Diagnosis not present

## 2023-12-18 DIAGNOSIS — E785 Hyperlipidemia, unspecified: Secondary | ICD-10-CM | POA: Diagnosis not present

## 2023-12-18 DIAGNOSIS — I5032 Chronic diastolic (congestive) heart failure: Secondary | ICD-10-CM | POA: Diagnosis not present

## 2023-12-18 DIAGNOSIS — I6523 Occlusion and stenosis of bilateral carotid arteries: Secondary | ICD-10-CM | POA: Diagnosis not present

## 2023-12-18 DIAGNOSIS — R0602 Shortness of breath: Secondary | ICD-10-CM | POA: Diagnosis present

## 2023-12-18 DIAGNOSIS — I272 Pulmonary hypertension, unspecified: Secondary | ICD-10-CM

## 2023-12-18 DIAGNOSIS — I2721 Secondary pulmonary arterial hypertension: Secondary | ICD-10-CM | POA: Insufficient documentation

## 2023-12-18 DIAGNOSIS — I4821 Permanent atrial fibrillation: Secondary | ICD-10-CM | POA: Insufficient documentation

## 2023-12-18 DIAGNOSIS — G4733 Obstructive sleep apnea (adult) (pediatric): Secondary | ICD-10-CM | POA: Diagnosis not present

## 2023-12-18 DIAGNOSIS — I130 Hypertensive heart and chronic kidney disease with heart failure and stage 1 through stage 4 chronic kidney disease, or unspecified chronic kidney disease: Secondary | ICD-10-CM | POA: Insufficient documentation

## 2023-12-18 LAB — LIPID PANEL
Cholesterol: 118 mg/dL (ref 0–200)
HDL: 59 mg/dL (ref >40)
LDL Cholesterol: 50 mg/dL (ref 0–99)
Total CHOL/HDL Ratio: 2 {ratio}
Triglycerides: 45 mg/dL (ref <150)
VLDL: 9 mg/dL (ref 0–40)

## 2023-12-18 LAB — BASIC METABOLIC PANEL
Anion gap: 7 (ref 5–15)
BUN: 42 mg/dL — ABNORMAL HIGH (ref 8–23)
CO2: 30 mmol/L (ref 22–32)
Calcium: 8.2 mg/dL — ABNORMAL LOW (ref 8.9–10.3)
Chloride: 101 mmol/L (ref 98–111)
Creatinine, Ser: 1.73 mg/dL — ABNORMAL HIGH (ref 0.61–1.24)
GFR, Estimated: 39 mL/min — ABNORMAL LOW (ref >60)
Glucose, Bld: 127 mg/dL — ABNORMAL HIGH (ref 70–99)
Potassium: 3.8 mmol/L (ref 3.5–5.1)
Sodium: 138 mmol/L (ref 135–145)

## 2023-12-18 LAB — BRAIN NATRIURETIC PEPTIDE: B Natriuretic Peptide: 656.3 pg/mL — ABNORMAL HIGH (ref 0.0–100.0)

## 2023-12-18 NOTE — Progress Notes (Signed)
 Advanced Heart Failure Clinic Note   PCP: Kirstie Peri, MD Primary Cardiologist: Dr. Diona Browner Nephrologist: Dr. Valentino Nose HF Cardiologist: Dr. Shirlee Latch  HPI: Robert Baldwin is a 83 y.o. male with history of carotid artery stenosis, DM II, HTN, HLD, permanent atrial fibrillation. He follows with Dr. Diona Browner in the cardiology clinic.    Saw [facility] for carotid artery stenosis in 6/23. Was noting more dyspnea with exertion and lower extremity edema for about a month. He was subsequently started on po lasix 20 mg daily.   He was seen by Dr. Diona Browner in the clinic 04/26/22 for evaluation of recent symptoms and pre-operative evaluation in preparation for left CEA followed by staged right CEA. Echo day of the visit showed EF 70-75%, hyperdynamic LV function, mild LVH, interventricular septum flattened in systole and diastole consistent with RV pressure and volume overload, RV moderately enlarged and moderately reduced, RVSP 120 mmHg, moderate LAE, severe RAE, moderate pericardial effusion, severe TR, dilated IVC. Patient was scheduled for outpatient R/LHC to better assess hemodynamics and further characterize pulmonary hypertension.    [facility] 7/23 showed no significant CAD, RA mean 25, PA 115/30 (58), LVEDP 22 mmHg, PCWP not obtained, Fick CO/CI 6.4/3.1, PAPi 3.4. PVR 5.6 WU. 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 He was diuresed with IV lasix. V/Q scan negative for chronic PE. ANA+. All other serologic test -. HRCT with no definite ILD. Started on tadalafil. Diuresed 24 lbs. Hospitalization complicated by right groin hematoma and received 1 unit PRBCs. Discharged home, weight 179 lbs.  Repeat RHC 8/23 showed elevated PCWP and severe mixed pulmonary arterial/venous hypertension, but PA pressures significantly lower than prior RHC. He was instructed to increase torsemide to 40 mg daily alternating with 20 mg every other day. [name] and [name] held with  hyperkalemia.  Patient had awake left TCAR by [name] in 9/23, no complications.  S/p right TCAR 10/23. Tolerated procedure well.   Echo 7/24 showed EF 60-65%, D-shaped interventricular septum suggestive of RV pressure/volume overload, moderate RV enlargement with mildly decreased systolic function, PASP 78 mmHg, mild mitral stenosis mean gradient 4, mild aortic stenosis mean gradient 15.   Follow up 7/24, creatinine elevated 2.35>>3.32. 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 Torsemide was increased to 40 mg bid.  Patient was admitted in 1/25 with multifocal PNA and volume overload at [employer] [facility] [employer]  He was diuresed and was treated with antibiotics.  He was sent home on oxygen but is no longer using it. Echo in 1/25 showed EF 65-70%, moderate RV enlargement with moderate RV dysfunction, severe biatrial enlargement, mild mitral stenosis with mean gradient 4.7 mmHg, moderate TR, mild aortic stenosis with mean gradient 11 mmHg, PASP 76 mmHg.   Today he returns for HF follow up with his wife. He is gradually feeling better since his PNA admission. He is walking with a cane. No dyspnea walking around the house.  He is short of breath walking longer distances. No chest pain.  No lightheadedness.  No orthopnea/PND.  HR has been in the 50s.  SBP mildly elevated today but running 120s-140 on his home readings. 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   ECG (personally reviewed): Atrial fibrillation rate 52, RBBB, inferior Qs  Labs (6/23): K 3.8, creatinine  1.53 Labs (7/23): K 5.5, creatinine 1.73 Labs (8/23): K 4.6, creatinine 1.5 Labs (9/23): LDL 56, K 3.9, creatinine 2.58 Labs (10/23): K 4.4, creatinine 1.94 Labs (11/23): K 5, creatinine  2.35 Labs (7/24): LDL 47, hgb 10.4, K 5.3, creatinine 3.32, BUN 102 Labs (9/24): K 4.0, creatinine 2.15 Labs (2/25): K 3.9, creatinine 1.75  6 minute walk (7/24): 259 m 6 minute walk (9/24): 195 m  PMH: 1. HTN 2. Hyperlipidemia 3. Type 2 diabetes 4. CKD stage 3 5. Atrial fibrillation: Permanent.  6. Carotid stenosis: CTA neck (6/23) with high grade stenosis of the bilateral internal carotid arteries.  - S/p left TCAR 9/23.  - S/p right TCAR 10/23. - Carotid doppler (9/24): LICA stent 50-75% stenosis, RICA stent 50-75% stenosis.  7. 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  - R/LHC (7/23): no significant CAD; RA mean 25, PA 115/30 (58), LVEDP 22 mmHg, PCWP not obtained, Fick CO/CI 6.4/3.1, PAPi 3.4. PVR 5.6 WU. - RHC (8/23): Mean RA 8, PA 85/40 mean 41, mean PCWP 22, CI 2.86, PVR 3.4 WU - Echo (7/24): EF 60-65%, D-shaped interventricular septum suggestive of RV pressure/volume overload, moderate RV enlargement with mildly decreased systolic function, PASP 78 mmHg, mild mitral stenosis mean gradient 4, mild aortic stenosis mean gradient 15.  - RHC (7/24): RA mean 9, PA 78/16 (44), PCWP 17 (prominent v-waves to 31), CO/CI (Fick) 7.63/4.09, PVR 3.54 WU - Echo (1/25): EF 65-70%, moderate RV enlargement with moderate RV dysfunction, severe biatrial enlargement, mild mitral stenosis with mean gradient 4.7 mmHg, moderate TR, mild aortic stenosis with mean gradient 11 mmHg, PASP 76 mmHg.  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  See RHC and echo data above.   - V/Q scan (7/23): No evidence for chronic PE.  - High resolution CT chest (7/23): No definite ILD.  - +  ANA -  RHC (7/24): RA mean 9, PA 78/16 (44), PCWP 17 (prominent v-waves to 31), CO/CI (Fick) 7.63/4.09, PVR 3.54 WU  Current Outpatient Medications  Medication Sig Dispense Refill   atorvastatin (LIPITOR) 40 MG tablet Take 40 mg by mouth at bedtime.     cholecalciferol (VITAMIN D) 25 MCG (1000 UNIT) tablet Take 1,000 Units by mouth daily.     FARXIGA 10 MG TABS tablet TAKE 1 TABLET BY MOUTH DAILY 30 tablet 4   fish oil-omega-3 fatty acids 1000 MG capsule Take 1 g by mouth every evening.     hydrALAZINE (APRESOLINE) 25 MG tablet Take 25 mg by mouth 2 (two) times daily.     macitentan (OPSUMIT) 10 MG tablet TAKE 1 TABLET BY MOUTH DAILY 90 tablet 3   Misc Natural Products (GLUCOS-CHONDROIT-MSM COMPLEX) TABS Take 2 tablets by mouth daily.     Multiple Vitamins-Minerals (MULTIVITAMIN WITH MINERALS) tablet Take 1 tablet by mouth in the morning.  Centrum Silver     niacin 500 MG tablet Take 500 mg by mouth in the morning.     potassium chloride (KLOR-CON M) 10 MEQ tablet Take 10 mEq by mouth daily.     tadalafil (CIALIS) 20 MG tablet TAKE 2 TABLETS BY MOUTH DAILY 180 tablet 3   torsemide (DEMADEX) 20 MG tablet Take 4 tablets (80 mg total) by mouth every  morning AND 2 tablets (40 mg total) every evening. 220 tablet 5   warfarin (COUMADIN) 5 MG tablet Take 5-7.5 mg by mouth See admin instructions. Take 5 mg on Saturday,Sunday Tues, Wed, Thursday, and then  Take 7.5 mg on Monday and Friday     No current facility-administered medications for this encounter.   Allergies  Allergen Reactions   Other Rash    Bandaids   Social History   Socioeconomic History   Marital status: Married    Spouse name: Tammy   Number of children: Not on file   Years of education: Not on file   Highest education level: Not on file  Occupational History   Occupation: Full time    Comment: Contractor  Tobacco Use   Smoking status: Never    Passive exposure: Never   Smokeless tobacco: Never   Tobacco comments:     Never smoke 05/18/22  Vaping Use   Vaping status: Never Used  Substance and Sexual Activity   Alcohol use: No    Alcohol/week: 0.0 standard drinks of alcohol   Drug use: No   Sexual activity: Not Currently  Other Topics Concern   Not on file  Social History Narrative   Not on file   Social Drivers of Health   Financial Resource Strain: Not on file  Food Insecurity: No Food Insecurity (11/13/2023)   Hunger Vital Sign    Worried About Running Out of Food in the Last Year: Never true    Ran Out of Food in the Last Year: Never true  Transportation Needs: No Transportation Needs (11/13/2023)   PRAPARE - Administrator, Civil Service (Medical): No    Lack of Transportation (Non-Medical): No  Physical Activity: Not on file  Stress: Not on file  Social Connections: Socially Integrated (11/13/2023)   Social Connection and Isolation Panel [NHANES]    Frequency of Communication with Friends and Family: Three times a week    Frequency of Social Gatherings with Friends and Family: Once a week    Attends Religious Services: 1 to 4 times per year    Active Member of [employer] or Organizations: Yes    Attends Banker Meetings: 1 to 4 times per year    Marital Status: Married  Catering manager Violence: Not At Risk (11/13/2023)   Humiliation, Afraid, Rape, and Kick questionnaire    Fear of Current or Ex-Partner: No    Emotionally Abused: No    Physically Abused: No    Sexually Abused: No   Family History  Problem Relation Age of Onset   Stroke Other    Heart disease Other    BP (!) 140/70   Pulse (!) 51   Wt 77.1 kg (170 lb)   SpO2 92%   BMI 26.63 kg/m   Wt Readings from Last 3 Encounters:  12/18/23 77.1 kg (170 lb)  11/29/23 72.9 kg (160 lb 12.8 oz)  09/18/23 79.4 kg (175 lb)   PHYSICAL EXAM: General: NAD Neck: No JVD, no thyromegaly or thyroid nodule.  Lungs: Crackles at bases bilaterally.  CV: Nondisplaced PMI.  Heart regular S1/S2, no S3/S4, 2/6 SEM  RUSB.  No peripheral edema.  No carotid bruit.  Normal pedal pulses.  Abdomen: Soft, nontender, no hepatosplenomegaly, no distention.  Skin: Intact without lesions or rashes.  Neurologic: Alert and oriented x 3.  Psych: Normal affect. Extremities: No clubbing or cyanosis.  HEENT: Normal.   ASSESSMENT & PLAN: 1. Chronic diastolic CHF with prominent RV  failure: Echo in 7/23 showed EF 70-75%, hyperdynamic LV function, mild LVH, interventricular septum flattened in systole and diastole consistent with RV pressure and volume overload, RV moderately enlarged and moderately reduced, RVSP 120 mmHg, moderate LAE, severe RAE, moderate pericardial effusion, severe TR, dilated IVC. RHC/LHC 7/23 showed preserved cardiac output with severe pulmonary hypertension. 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  PA pressure was still high, but is significantly lower than prior RHC.  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 Echo in 1/25 showed EF 65-70%, moderate RV enlargement with moderate RV dysfunction, severe biatrial enlargement, mild mitral stenosis with mean gradient 4.7 mmHg, moderate TR, mild aortic stenosis with mean gradient 11 mmHg, PASP 76 mmHg. NYHA class II-III, recovering from PNA.  He is not volume overloaded on exam. Weight is down 8 lbs.  - Continue torsemide 80 qam/40 qpm.  BMET/BNP today.  - Continue Farxiga 10 mg daily.  - Off Entresto and spiro with declining renal function and elevated K. 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  Some improvement on 8/23 RHC still with severe mixed PAH/PVH with PVR down to 3.4 WU.  He has never smoked and has no known lung disease.  He has been on warfarin for atrial fibrillation long-term. No  known rheumatologic illness.  Not a drinker though he has some signs of cirrhosis by prior liver imaging.  V/Q scan in 7/23 negative for chronic PEs and HRCT chest without definite ILD. Serologic workup showed anti-SCL 70 neg, anti-centromere Ab neg, RF neg, ANA with reflex positive. Suspicion for component of group 1 PH (mixed group 1 and 2).  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 - 6 minute walk next appointment when he is farther out from PNA hospitalization.  - Diuretics as above. - Continue Opsumit 10 mg daily. - Continue tadalafil 40 mg daily. - Has OSA but unable to tolerate CPAP.   - I will refer [facility] pulmonary [facility] at [facility].  3. Atrial fibrillation: Permanent.   - Continue warfarin.   - Bradycardic, does not need nodal blocker. Asymptomatic 4. DM2: Continue SGLT2i 5. Carotid stenosis: CTA neck in 6/23 showed high grade bilateral ICA stenoses. He has had awake L TCAR 9/23 and R TCAR 10/23.  - He is on warfarin long-term for AF.    - Continue statin, goal LDL < 55.  Check lipids today.  6. HTN: BP controlled on home readings.  - Can continue hydralazine 25 mg bid.  - He is off amlodipine, does not need to restart.  7. CKD stage IIIb:  - Continue SGLT2i.  - BMET today.  8. Aortic stenosis: Mild on 1/25 echo.  9. Mitral stenosis: Mild on 1/25 echo.   Follow up in 2 months with APP.   I spent 31 minutes reviewing records, interviewing/examining patient, and managing orders.    Marca Ancona  12/18/2023

## 2023-12-18 NOTE — Patient Instructions (Signed)
 Good to see you today!   You have been referred to pulmonary rehab they will call you for an appointment  Labs done today, your results will be available in MyChart, we will contact you for abnormal readings.  Your physician recommends that you schedule a follow-up appointment in: 46months(April ) Call in March to schedule an appointment  If you have any questions or concerns before your next appointment please send [place] a message through [place] or call our office at [phone].    TO LEAVE A MESSAGE FOR THE NURSE SELECT OPTION 2, PLEASE LEAVE A MESSAGE INCLUDING: YOUR NAME DATE OF BIRTH CALL BACK NUMBER REASON FOR CALL**this is important as we prioritize the call backs  YOU WILL RECEIVE A CALL BACK THE SAME DAY AS LONG AS YOU CALL BEFORE 4:00 PM At the Advanced Heart Failure Clinic, you and your health needs are our priority. As part of our continuing mission to provide you with exceptional heart care, we have created designated Provider Care Teams. These Care Teams include your primary Cardiologist (physician) and Advanced Practice Providers (APPs- Physician Assistants and Nurse Practitioners) who all work together to provide you with the care you need, when you need it.   You may see any of the following providers on your designated Care Team at your next follow up: Dr Arvilla Meres Dr Marca Ancona Dr. Dorthula Nettles Dr. Clearnce Hasten Amy Filbert Schilder, NP Robbie Lis, [place] [facility] Rouse, [place] Brynda Peon, NP [place] Lee, NP Clarisa Kindred, NP Karle Plumber, PharmD Enos Fling, PharmD   Please be sure to bring in all your medications bottles to every appointment.    Thank you for choosing [facility]

## 2023-12-20 DIAGNOSIS — I130 Hypertensive heart and chronic kidney disease with heart failure and stage 1 through stage 4 chronic kidney disease, or unspecified chronic kidney disease: Secondary | ICD-10-CM | POA: Diagnosis not present

## 2023-12-20 DIAGNOSIS — E1165 Type 2 diabetes mellitus with hyperglycemia: Secondary | ICD-10-CM | POA: Diagnosis not present

## 2023-12-20 DIAGNOSIS — J9601 Acute respiratory failure with hypoxia: Secondary | ICD-10-CM | POA: Diagnosis not present

## 2023-12-20 DIAGNOSIS — I4821 Permanent atrial fibrillation: Secondary | ICD-10-CM | POA: Diagnosis not present

## 2023-12-20 DIAGNOSIS — I2721 Secondary pulmonary arterial hypertension: Secondary | ICD-10-CM | POA: Diagnosis not present

## 2023-12-20 DIAGNOSIS — J189 Pneumonia, unspecified organism: Secondary | ICD-10-CM | POA: Diagnosis not present

## 2023-12-20 DIAGNOSIS — I5033 Acute on chronic diastolic (congestive) heart failure: Secondary | ICD-10-CM | POA: Diagnosis not present

## 2023-12-20 DIAGNOSIS — E1122 Type 2 diabetes mellitus with diabetic chronic kidney disease: Secondary | ICD-10-CM | POA: Diagnosis not present

## 2023-12-20 DIAGNOSIS — N184 Chronic kidney disease, stage 4 (severe): Secondary | ICD-10-CM | POA: Diagnosis not present

## 2023-12-23 ENCOUNTER — Telehealth: Payer: Self-pay | Admitting: Cardiology

## 2023-12-23 NOTE — Telephone Encounter (Signed)
 Pt c/o medication issue:  1. Name of Medication: Olmesartenme medoxomil 5mg   2. How are you currently taking this medication (dosage and times per day)? Two tablets daily  3. Are you having a reaction (difficulty breathing--STAT)?   4. What is your medication issue? Patient's wife called wanting to know if it's okay for patient to be on this medication.   Patient's wife also states is got a note for [facility] that his due for the RSV and pneumonia vaccine.  She wants to know if he can take it.  She states he recent just came out of the hospital with pneumonia.  Please advise.

## 2023-12-23 NOTE — Telephone Encounter (Signed)
Will forward to pharmD for advice.

## 2023-12-24 ENCOUNTER — Encounter (HOSPITAL_COMMUNITY): Payer: Medicare PPO

## 2023-12-24 ENCOUNTER — Ambulatory Visit: Payer: Medicare PPO

## 2023-12-24 DIAGNOSIS — N184 Chronic kidney disease, stage 4 (severe): Secondary | ICD-10-CM | POA: Diagnosis not present

## 2023-12-24 DIAGNOSIS — J189 Pneumonia, unspecified organism: Secondary | ICD-10-CM | POA: Diagnosis not present

## 2023-12-24 DIAGNOSIS — I4821 Permanent atrial fibrillation: Secondary | ICD-10-CM | POA: Diagnosis not present

## 2023-12-24 DIAGNOSIS — I2721 Secondary pulmonary arterial hypertension: Secondary | ICD-10-CM | POA: Diagnosis not present

## 2023-12-24 DIAGNOSIS — E1165 Type 2 diabetes mellitus with hyperglycemia: Secondary | ICD-10-CM | POA: Diagnosis not present

## 2023-12-24 DIAGNOSIS — E1122 Type 2 diabetes mellitus with diabetic chronic kidney disease: Secondary | ICD-10-CM | POA: Diagnosis not present

## 2023-12-24 DIAGNOSIS — I130 Hypertensive heart and chronic kidney disease with heart failure and stage 1 through stage 4 chronic kidney disease, or unspecified chronic kidney disease: Secondary | ICD-10-CM | POA: Diagnosis not present

## 2023-12-24 DIAGNOSIS — I5033 Acute on chronic diastolic (congestive) heart failure: Secondary | ICD-10-CM | POA: Diagnosis not present

## 2023-12-24 DIAGNOSIS — J9601 Acute respiratory failure with hypoxia: Secondary | ICD-10-CM | POA: Diagnosis not present

## 2023-12-24 NOTE — Telephone Encounter (Signed)
Patient wife informed and verbalized understanding of plan.

## 2023-12-24 NOTE — Telephone Encounter (Signed)
 Ok to take [facility]. Ok to get vaccines but would wait for him to fully recover from his recent illness.

## 2023-12-26 DIAGNOSIS — I130 Hypertensive heart and chronic kidney disease with heart failure and stage 1 through stage 4 chronic kidney disease, or unspecified chronic kidney disease: Secondary | ICD-10-CM | POA: Diagnosis not present

## 2023-12-26 DIAGNOSIS — E1165 Type 2 diabetes mellitus with hyperglycemia: Secondary | ICD-10-CM | POA: Diagnosis not present

## 2023-12-26 DIAGNOSIS — I5033 Acute on chronic diastolic (congestive) heart failure: Secondary | ICD-10-CM | POA: Diagnosis not present

## 2023-12-26 DIAGNOSIS — J9601 Acute respiratory failure with hypoxia: Secondary | ICD-10-CM | POA: Diagnosis not present

## 2023-12-26 DIAGNOSIS — E1122 Type 2 diabetes mellitus with diabetic chronic kidney disease: Secondary | ICD-10-CM | POA: Diagnosis not present

## 2023-12-26 DIAGNOSIS — I4821 Permanent atrial fibrillation: Secondary | ICD-10-CM | POA: Diagnosis not present

## 2023-12-26 DIAGNOSIS — J189 Pneumonia, unspecified organism: Secondary | ICD-10-CM | POA: Diagnosis not present

## 2023-12-26 DIAGNOSIS — I2721 Secondary pulmonary arterial hypertension: Secondary | ICD-10-CM | POA: Diagnosis not present

## 2023-12-26 DIAGNOSIS — N184 Chronic kidney disease, stage 4 (severe): Secondary | ICD-10-CM | POA: Diagnosis not present

## 2023-12-27 DIAGNOSIS — I50813 Acute on chronic right heart failure: Secondary | ICD-10-CM | POA: Diagnosis not present

## 2023-12-31 DIAGNOSIS — Z299 Encounter for prophylactic measures, unspecified: Secondary | ICD-10-CM | POA: Diagnosis not present

## 2023-12-31 DIAGNOSIS — N184 Chronic kidney disease, stage 4 (severe): Secondary | ICD-10-CM | POA: Diagnosis not present

## 2023-12-31 DIAGNOSIS — I5032 Chronic diastolic (congestive) heart failure: Secondary | ICD-10-CM | POA: Diagnosis not present

## 2023-12-31 DIAGNOSIS — I4891 Unspecified atrial fibrillation: Secondary | ICD-10-CM | POA: Diagnosis not present

## 2024-01-01 DIAGNOSIS — J189 Pneumonia, unspecified organism: Secondary | ICD-10-CM | POA: Diagnosis not present

## 2024-01-01 DIAGNOSIS — I5033 Acute on chronic diastolic (congestive) heart failure: Secondary | ICD-10-CM | POA: Diagnosis not present

## 2024-01-01 DIAGNOSIS — I130 Hypertensive heart and chronic kidney disease with heart failure and stage 1 through stage 4 chronic kidney disease, or unspecified chronic kidney disease: Secondary | ICD-10-CM | POA: Diagnosis not present

## 2024-01-01 DIAGNOSIS — J9601 Acute respiratory failure with hypoxia: Secondary | ICD-10-CM | POA: Diagnosis not present

## 2024-01-01 DIAGNOSIS — I2721 Secondary pulmonary arterial hypertension: Secondary | ICD-10-CM | POA: Diagnosis not present

## 2024-01-01 DIAGNOSIS — I4821 Permanent atrial fibrillation: Secondary | ICD-10-CM | POA: Diagnosis not present

## 2024-01-01 DIAGNOSIS — E1122 Type 2 diabetes mellitus with diabetic chronic kidney disease: Secondary | ICD-10-CM | POA: Diagnosis not present

## 2024-01-01 DIAGNOSIS — N184 Chronic kidney disease, stage 4 (severe): Secondary | ICD-10-CM | POA: Diagnosis not present

## 2024-01-01 DIAGNOSIS — E1165 Type 2 diabetes mellitus with hyperglycemia: Secondary | ICD-10-CM | POA: Diagnosis not present

## 2024-01-07 DIAGNOSIS — I130 Hypertensive heart and chronic kidney disease with heart failure and stage 1 through stage 4 chronic kidney disease, or unspecified chronic kidney disease: Secondary | ICD-10-CM | POA: Diagnosis not present

## 2024-01-07 DIAGNOSIS — I5033 Acute on chronic diastolic (congestive) heart failure: Secondary | ICD-10-CM | POA: Diagnosis not present

## 2024-01-07 DIAGNOSIS — I2721 Secondary pulmonary arterial hypertension: Secondary | ICD-10-CM | POA: Diagnosis not present

## 2024-01-07 DIAGNOSIS — E1122 Type 2 diabetes mellitus with diabetic chronic kidney disease: Secondary | ICD-10-CM | POA: Diagnosis not present

## 2024-01-07 DIAGNOSIS — I4821 Permanent atrial fibrillation: Secondary | ICD-10-CM | POA: Diagnosis not present

## 2024-01-07 DIAGNOSIS — E1165 Type 2 diabetes mellitus with hyperglycemia: Secondary | ICD-10-CM | POA: Diagnosis not present

## 2024-01-07 DIAGNOSIS — J9601 Acute respiratory failure with hypoxia: Secondary | ICD-10-CM | POA: Diagnosis not present

## 2024-01-07 DIAGNOSIS — N184 Chronic kidney disease, stage 4 (severe): Secondary | ICD-10-CM | POA: Diagnosis not present

## 2024-01-07 DIAGNOSIS — J189 Pneumonia, unspecified organism: Secondary | ICD-10-CM | POA: Diagnosis not present

## 2024-01-14 ENCOUNTER — Encounter (HOSPITAL_COMMUNITY)
Admission: RE | Admit: 2024-01-14 | Discharge: 2024-01-14 | Disposition: A | Discharge status: Home / Self Care | Source: Ambulatory Visit | Attending: Cardiology | Admitting: Cardiology

## 2024-01-14 DIAGNOSIS — Z125 Encounter for screening for malignant neoplasm of prostate: Secondary | ICD-10-CM | POA: Diagnosis not present

## 2024-01-14 DIAGNOSIS — Z79899 Other long term (current) drug therapy: Secondary | ICD-10-CM | POA: Diagnosis not present

## 2024-01-14 DIAGNOSIS — Z299 Encounter for prophylactic measures, unspecified: Secondary | ICD-10-CM | POA: Diagnosis not present

## 2024-01-14 DIAGNOSIS — I4891 Unspecified atrial fibrillation: Secondary | ICD-10-CM | POA: Diagnosis not present

## 2024-01-14 DIAGNOSIS — I5032 Chronic diastolic (congestive) heart failure: Secondary | ICD-10-CM | POA: Diagnosis not present

## 2024-01-14 DIAGNOSIS — Z0000 Encounter for general adult medical examination without abnormal findings: Secondary | ICD-10-CM | POA: Diagnosis not present

## 2024-01-14 DIAGNOSIS — R5383 Other fatigue: Secondary | ICD-10-CM | POA: Diagnosis not present

## 2024-01-14 DIAGNOSIS — I272 Pulmonary hypertension, unspecified: Secondary | ICD-10-CM | POA: Insufficient documentation

## 2024-01-14 DIAGNOSIS — I10 Essential (primary) hypertension: Secondary | ICD-10-CM | POA: Diagnosis not present

## 2024-01-14 DIAGNOSIS — E7800 Pure hypercholesterolemia, unspecified: Secondary | ICD-10-CM | POA: Diagnosis not present

## 2024-01-14 NOTE — Progress Notes (Signed)
 Completed virtual orientation today.  EP evaluation is scheduled for 01/23/24 at 1:30pm .  Documentation for diagnosis can be found in [facility] encounter 12/18/23.

## 2024-01-15 DIAGNOSIS — I130 Hypertensive heart and chronic kidney disease with heart failure and stage 1 through stage 4 chronic kidney disease, or unspecified chronic kidney disease: Secondary | ICD-10-CM | POA: Diagnosis not present

## 2024-01-15 DIAGNOSIS — I4821 Permanent atrial fibrillation: Secondary | ICD-10-CM | POA: Diagnosis not present

## 2024-01-15 DIAGNOSIS — J189 Pneumonia, unspecified organism: Secondary | ICD-10-CM | POA: Diagnosis not present

## 2024-01-15 DIAGNOSIS — J9601 Acute respiratory failure with hypoxia: Secondary | ICD-10-CM | POA: Diagnosis not present

## 2024-01-15 DIAGNOSIS — I5033 Acute on chronic diastolic (congestive) heart failure: Secondary | ICD-10-CM | POA: Diagnosis not present

## 2024-01-15 DIAGNOSIS — N184 Chronic kidney disease, stage 4 (severe): Secondary | ICD-10-CM | POA: Diagnosis not present

## 2024-01-15 DIAGNOSIS — I2721 Secondary pulmonary arterial hypertension: Secondary | ICD-10-CM | POA: Diagnosis not present

## 2024-01-15 DIAGNOSIS — E1122 Type 2 diabetes mellitus with diabetic chronic kidney disease: Secondary | ICD-10-CM | POA: Diagnosis not present

## 2024-01-15 DIAGNOSIS — E1165 Type 2 diabetes mellitus with hyperglycemia: Secondary | ICD-10-CM | POA: Diagnosis not present

## 2024-01-20 ENCOUNTER — Encounter: Payer: Self-pay | Admitting: Cardiology

## 2024-01-20 ENCOUNTER — Ambulatory Visit: Payer: Medicare PPO | Attending: Cardiology | Admitting: Cardiology

## 2024-01-20 VITALS — BP 134/52 | HR 47 | Ht 67.0 in | Wt 173.2 lb

## 2024-01-20 DIAGNOSIS — I272 Pulmonary hypertension, unspecified: Secondary | ICD-10-CM | POA: Diagnosis not present

## 2024-01-20 DIAGNOSIS — I5032 Chronic diastolic (congestive) heart failure: Secondary | ICD-10-CM

## 2024-01-20 DIAGNOSIS — N1832 Chronic kidney disease, stage 3b: Secondary | ICD-10-CM | POA: Diagnosis not present

## 2024-01-20 DIAGNOSIS — I4821 Permanent atrial fibrillation: Secondary | ICD-10-CM

## 2024-01-20 NOTE — Progress Notes (Signed)
 Cardiology Office Note  Date: 01/20/2024   ID: Zedekiah, Hinderman Robert Baldwin, MRN [id number]  History of Present Illness: Robert Baldwin is an 83 y.o. male last seen in November 2024.  He had interval follow-up in the heart failure clinic with Dr. Shirlee Baldwin in February, I reviewed the note.  He is here today with his son for a follow-up visit.  States that he has been doing well in terms of fluid retention, no leg swelling at present.  Weight has been relatively stable and he has not had to use any extra Demadex.  Reports NYHA class II-III dyspnea.  No dizziness or syncope.  He remains on Coumadin with follow-up by Dr. Sherryll Baldwin.  He does not report any spontaneous bleeding problems.  I reviewed his recent lab work which is noted below.  He also had a follow-up echocardiogram in January which I reviewed.  Physical Exam: VS:  BP (!) 134/52   Pulse (!) 47   Ht 5\' 7"  (1.702 m)   Wt 173 lb 3.2 oz (78.6 kg)   SpO2 93%   BMI 27.13 kg/m , BMI Body mass index is 27.13 kg/m.  Wt Readings from Last 3 Encounters:  01/20/24 173 lb 3.2 oz (78.6 kg)  12/18/23 170 lb (77.1 kg)  11/29/23 160 lb 12.8 oz (72.9 kg)    General: Patient appears comfortable at rest. HEENT: Conjunctiva and lids normal. Neck: Supple, no elevated JVP or carotid bruits. Lungs: Clear to auscultation, nonlabored breathing at rest. Cardiac: Irregularly irregular, 3/6 systolic murmur without gallop. Extremities: No pitting edema.  ECG:  An ECG dated 12/18/2023 was personally reviewed today and demonstrated:  Atrial fibrillation at 52 bpm, right bundle branch block.  Labwork: 11/17/2023: ALT 41; AST 37 11/27/2023: Hemoglobin 9.7; Platelets 292 11/29/2023: Magnesium 2.7 12/18/2023: B Natriuretic Peptide 656.3; BUN 42; Creatinine, Ser 1.73; Potassium 3.8; Sodium 138     Component Value Date/Time   CHOL 118 12/18/2023 0910   TRIG 45 12/18/2023 0910   HDL 59 12/18/2023 0910   CHOLHDL 2.0 12/18/2023 0910   VLDL 9 12/18/2023 0910    LDLCALC 50 12/18/2023 0910  March 2025: BUN 78, creatinine 2.29, GFR 28, potassium 4.5, AST 24, ALT 11, hemoglobin 11.2, platelets 254, cholesterol 122, triglycerides 53, HDL 60, LDL 50, TSH 1.87  Other Studies Reviewed Today:  Echocardiogram 11/19/2023:  1. Left ventricular ejection fraction, by estimation, is 65 to 70%. The  left ventricle has normal function. The left ventricle has no regional  wall motion abnormalities. Left ventricular diastolic parameters are  indeterminate.   2. Right ventricular systolic function is moderately reduced. The right  ventricular size is moderately enlarged. There is severely elevated  pulmonary artery systolic pressure.   3. Left atrial size was severely dilated.   4. Right atrial size was severely dilated.   5. A small pericardial effusion is present. The pericardial effusion is  circumferential.   6. The mitral valve is abnormal. No evidence of mitral valve  regurgitation. Mild mitral stenosis. The mean mitral valve gradient is 4.7  mmHg.Heart rate is 43 bpm.   7. The tricuspid valve is abnormal. Tricuspid valve regurgitation is  moderate.   8. The aortic valve was not well visualized. Aortic valve regurgitation  is not visualized. Mild aortic valve stenosis. Aortic valve area, by VTI  measures 2.34 cm. Aortic valve mean gradient measures 11.0 mmHg.   9. The inferior vena cava is dilated in size with <50% respiratory  variability, suggesting right  atrial pressure of 15 mmHg.   Assessment and Plan:  1.  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 Continues to follow in the advanced heart failure clinic.  Echocardiogram updated in January as noted above.  Continues with NYHA class II-III symptoms, no peripheral fluid retention evident today and weight has been relatively stable.  Continue Farxiga 10 mg daily, Demadex 80 mg in the morning and 40 mg in the evening, KCl 10 mg daily.  He is also on Opsumit 10 mg daily.   Keep follow-up in the heart failure clinic in April.   2.  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  Continues to follow with Robert Baldwin with [facility].  Continue Lipitor 40 mg daily.   3.  Permanent atrial fibrillation with CHA2DS2-VASc score 5.  He is on Coumadin for stroke prophylaxis and follows with PCP.  Chronic, slow ventricular response and not clearly symptomatic, no dizziness or syncope.  He is not on any AV nodal blockers.   4.  OSA, intolerant of CPAP.  Keep follow-up with Robert Baldwin.   5.  CKD stage IIIb-IV.  Recent creatinine 2.29 with GFR 28.   6.  Mixed hyperlipidemia on Lipitor 40 mg daily.  Recent LDL 50.   7.  Primary hypertension.  Currently on Benicar 5 mg daily, hydralazine 25 mg twice daily, and Norvasc 10 mg daily.  Disposition:  Follow up  4 months.  Signed, Jonelle Sidle, M.D., F.A.C.C. [facility] at [facility]

## 2024-01-20 NOTE — Patient Instructions (Addendum)
 Medication Instructions:  Your physician recommends that you continue on your current medications as directed. Please refer to the Current Medication list given to you today.  Labwork: none  Testing/Procedures: none  Follow-Up: Your physician recommends that you schedule a follow-up appointment in: 4 months Please schedule your appointment to be seen in the Heart Failure Clinic. You are due in April 2025.  Any Other Special Instructions Will Be Listed Below (If Applicable).  If you need a refill on your cardiac medications before your next appointment, please call your pharmacy.

## 2024-01-22 DIAGNOSIS — J9601 Acute respiratory failure with hypoxia: Secondary | ICD-10-CM | POA: Diagnosis not present

## 2024-01-22 DIAGNOSIS — N184 Chronic kidney disease, stage 4 (severe): Secondary | ICD-10-CM | POA: Diagnosis not present

## 2024-01-22 DIAGNOSIS — I4821 Permanent atrial fibrillation: Secondary | ICD-10-CM | POA: Diagnosis not present

## 2024-01-22 DIAGNOSIS — I5033 Acute on chronic diastolic (congestive) heart failure: Secondary | ICD-10-CM | POA: Diagnosis not present

## 2024-01-22 DIAGNOSIS — E1122 Type 2 diabetes mellitus with diabetic chronic kidney disease: Secondary | ICD-10-CM | POA: Diagnosis not present

## 2024-01-22 DIAGNOSIS — I2721 Secondary pulmonary arterial hypertension: Secondary | ICD-10-CM | POA: Diagnosis not present

## 2024-01-22 DIAGNOSIS — E1165 Type 2 diabetes mellitus with hyperglycemia: Secondary | ICD-10-CM | POA: Diagnosis not present

## 2024-01-22 DIAGNOSIS — I130 Hypertensive heart and chronic kidney disease with heart failure and stage 1 through stage 4 chronic kidney disease, or unspecified chronic kidney disease: Secondary | ICD-10-CM | POA: Diagnosis not present

## 2024-01-22 DIAGNOSIS — J189 Pneumonia, unspecified organism: Secondary | ICD-10-CM | POA: Diagnosis not present

## 2024-01-23 ENCOUNTER — Encounter (HOSPITAL_COMMUNITY)
Admission: RE | Admit: 2024-01-23 | Discharge: 2024-01-23 | Disposition: A | Discharge status: Home / Self Care | Source: Ambulatory Visit | Attending: Cardiology | Admitting: Cardiology

## 2024-01-23 VITALS — Ht 64.0 in | Wt 172.0 lb

## 2024-01-23 DIAGNOSIS — I272 Pulmonary hypertension, unspecified: Secondary | ICD-10-CM | POA: Insufficient documentation

## 2024-01-23 NOTE — Patient Instructions (Signed)
 Patient Instructions  Patient Details  Name: Robert Baldwin MRN: [id number] Date of Birth: December 04, 1940 Referring Provider:  Laurey Morale, MD  Below are your personal goals for exercise, nutrition, and risk factors. Our goal is to help you stay on track towards obtaining and maintaining these goals. We will be discussing your progress on these goals with you throughout the program.  Initial Exercise Prescription:  Initial Exercise Prescription - 01/23/24 1400       Date of Initial Exercise RX and Referring Provider   Date 01/23/24    Referring Provider Marca Ancona MD      Oxygen   Maintain Oxygen Saturation 88% or higher      NuStep   Level 2    SPM 60    Minutes 15    METs 1.9      Track   Laps 15    Minutes 15    METs 1.9      Prescription Details   Frequency (times per week) 2    Duration Progress to 30 minutes of continuous aerobic without signs/symptoms of physical distress      Intensity   THRR 40-80% of Max Heartrate 81-119    Ratings of Perceived Exertion 11-13    Perceived Dyspnea 0-4      Resistance Training   Training Prescription Yes    Weight 3    Reps 10-15             Exercise Goals: Frequency: Be able to perform aerobic exercise two to three times per week in program working toward 2-5 days per week of home exercise.  Intensity: Work with a perceived exertion of 11 (fairly light) - 15 (hard) while following your exercise prescription.  We will make changes to your prescription with you as you progress through the program.   Duration: Be able to do 30 to 45 minutes of continuous aerobic exercise in addition to a 5 minute warm-up and a 5 minute cool-down routine.   Nutrition Goals: Your personal nutrition goals will be established when you do your nutrition analysis with the dietician.  The following are general nutrition guidelines to follow: Cholesterol < 200mg /day Sodium < 1500mg /day Fiber: Men over 50 yrs - 30 grams per  day  Personal Goals:  Personal Goals and Risk Factors at Admission - 01/14/24 1358       Core Components/Risk Factors/Patient Goals on Admission    Weight Management Yes    Intervention Weight Management: Develop a combined nutrition and exercise program designed to reach desired caloric intake, while maintaining appropriate intake of nutrient and fiber, sodium and fats, and appropriate energy expenditure required for the weight goal.;Weight Management: Provide education and appropriate resources to help participant work on and attain dietary goals.;Weight Management/Obesity: Establish reasonable short term and long term weight goals.;Obesity: Provide education and appropriate resources to help participant work on and attain dietary goals.    Expected Outcomes Short Term: Continue to assess and modify interventions until short term weight is achieved;Long Term: Adherence to nutrition and physical activity/exercise program aimed toward attainment of established weight goal;Weight Maintenance: Understanding of the daily nutrition guidelines, which includes 25-35% calories from fat, 7% or less cal from saturated fats, less than 200mg  cholesterol, less than 1.5gm of sodium, & 5 or more servings of fruits and vegetables daily;Understanding recommendations for meals to include 15-35% energy as protein, 25-35% energy from fat, 35-60% energy from carbohydrates, less than 200mg  of dietary cholesterol, 20-35 gm of total fiber daily  Improve shortness of breath with ADL's Yes    Intervention Provide education, individualized exercise plan and daily activity instruction to help decrease symptoms of SOB with activities of daily living.    Expected Outcomes Short Term: Improve cardiorespiratory fitness to achieve a reduction of symptoms when performing ADLs;Long Term: Be able to perform more ADLs without symptoms or delay the onset of symptoms    Increase knowledge of respiratory medications and ability to use  respiratory devices properly  Yes    Intervention Provide education and demonstration as needed of appropriate use of medications, inhalers, and oxygen therapy.    Expected Outcomes Short Term: Achieves understanding of medications use. Understands that oxygen is a medication prescribed by physician. Demonstrates appropriate use of inhaler and oxygen therapy.;Long Term: Maintain appropriate use of medications, inhalers, and oxygen therapy.    Diabetes Yes    Intervention Provide education about proper nutrition, including hydration, and aerobic/resistive exercise prescription along with prescribed medications to achieve blood glucose in normal ranges: Fasting glucose 65-99 mg/dL;Provide education about signs/symptoms and action to take for hypo/hyperglycemia.    Expected Outcomes Short Term: Participant verbalizes understanding of the signs/symptoms and immediate care of hyper/hypoglycemia, proper foot care and importance of medication, aerobic/resistive exercise and nutrition plan for blood glucose control.;Long Term: Attainment of HbA1C < 7%.    Hypertension Yes    Intervention Monitor prescription use compliance.;Provide education on lifestyle modifcations including regular physical activity/exercise, weight management, moderate sodium restriction and increased consumption of fresh fruit, vegetables, and low fat dairy, alcohol moderation, and smoking cessation.    Expected Outcomes Short Term: Continued assessment and intervention until BP is < 140/24mm HG in hypertensive participants. < 130/25mm HG in hypertensive participants with diabetes, heart failure or chronic kidney disease.;Long Term: Maintenance of blood pressure at goal levels.    Lipids Yes    Intervention Provide education and support for participant on nutrition & aerobic/resistive exercise along with prescribed medications to achieve LDL 70mg , HDL >40mg .    Expected Outcomes Short Term: Participant states understanding of desired  cholesterol values and is compliant with medications prescribed. Participant is following exercise prescription and nutrition guidelines.;Long Term: Cholesterol controlled with medications as prescribed, with individualized exercise RX and with personalized nutrition plan. Value goals: LDL < 70mg , HDL > 40 mg.             Tobacco Use Initial Evaluation: Social History   Tobacco Use  Smoking Status Never   Passive exposure: Never  Smokeless Tobacco Never  Tobacco Comments   Never smoke 05/18/22    Exercise Goals and Review:  Exercise Goals     Row Name 01/14/24 1333             Exercise Goals   Increase Physical Activity Yes       Intervention Provide advice, education, support and counseling about physical activity/exercise needs.;Develop an individualized exercise prescription for aerobic and resistive training based on initial evaluation findings, risk stratification, comorbidities and participant's personal goals.       Expected Outcomes Short Term: Attend rehab on a regular basis to increase amount of physical activity.;Long Term: Add in home exercise to make exercise part of routine and to increase amount of physical activity.;Long Term: Exercising regularly at least 3-5 days a week.       Increase Strength and Stamina Yes       Intervention Provide advice, education, support and counseling about physical activity/exercise needs.;Develop an individualized exercise prescription for aerobic and resistive training based on  initial evaluation findings, risk stratification, comorbidities and participant's personal goals.       Expected Outcomes Short Term: Increase workloads from initial exercise prescription for resistance, speed, and METs.;Short Term: Perform resistance training exercises routinely during rehab and add in resistance training at home;Long Term: Improve cardiorespiratory fitness, muscular endurance and strength as measured by increased METs and functional capacity  ( )       Able to understand and use rate of perceived exertion (RPE) scale Yes       Intervention Provide education and explanation on how to use RPE scale       Expected Outcomes Short Term: Able to use RPE daily in rehab to express subjective intensity level;Long Term:  Able to use RPE to guide intensity level when exercising independently       Able to understand and use Dyspnea scale Yes       Intervention Provide education and explanation on how to use Dyspnea scale       Expected Outcomes Short Term: Able to use Dyspnea scale daily in rehab to express subjective sense of shortness of breath during exertion;Long Term: Able to use Dyspnea scale to guide intensity level when exercising independently       Knowledge and understanding of Target Heart Rate Range (THRR) Yes       Intervention Provide education and explanation of THRR including how the numbers were predicted and where they are located for reference       Expected Outcomes Short Term: Able to use daily as guideline for intensity in rehab;Long Term: Able to use THRR to govern intensity when exercising independently;Short Term: Able to state/look up THRR       Able to check pulse independently Yes       Intervention Provide education and demonstration on how to check pulse in carotid and radial arteries.;Review the importance of being able to check your own pulse for safety during independent exercise       Expected Outcomes Short Term: Able to explain why pulse checking is important during independent exercise;Long Term: Able to check pulse independently and accurately       Understanding of Exercise Prescription Yes       Intervention Provide education, explanation, and written materials on patient's individual exercise prescription       Expected Outcomes Short Term: Able to explain program exercise prescription;Long Term: Able to explain home exercise prescription to exercise independently                Copy of goals given  to participant.

## 2024-01-23 NOTE — Progress Notes (Signed)
 Pulmonary Individual Treatment Plan  Patient Details  Name: Robert Baldwin MRN: [id number] Date of Birth: 1941/07/05 Referring Provider:   Flowsheet Row PULMONARY REHAB OTHER RESP ORIENTATION from 01/23/2024 in [facility] CARDIAC REHABILITATION  Referring Provider Marca Ancona MD       Initial Encounter Date:  Flowsheet Row PULMONARY REHAB OTHER RESP ORIENTATION from 01/23/2024 in [place] CARDIAC REHABILITATION  Date 01/23/24       Visit Diagnosis: Pulmonary hypertension (HCC)  Patient's Home Medications on Admission:   Current Outpatient Medications:    atorvastatin (LIPITOR) 40 MG tablet, Take 40 mg by mouth at bedtime., Disp: , Rfl:    cholecalciferol (VITAMIN D) 25 MCG (1000 UNIT) tablet, Take 1,000 Units by mouth daily., Disp: , Rfl:    FARXIGA 10 MG TABS tablet, TAKE 1 TABLET BY MOUTH DAILY, Disp: 30 tablet, Rfl: 4   fish oil-omega-3 fatty acids 1000 MG capsule, Take 1 g by mouth every evening., Disp: , Rfl:    hydrALAZINE (APRESOLINE) 25 MG tablet, Take 25 mg by mouth 2 (two) times daily., Disp: , Rfl:    macitentan (OPSUMIT) 10 MG tablet, TAKE 1 TABLET BY MOUTH DAILY, Disp: 90 tablet, Rfl: 3   Misc Natural Products (GLUCOS-CHONDROIT-MSM COMPLEX) TABS, Take 2 tablets by mouth daily., Disp: , Rfl:    Multiple Vitamins-Minerals (MULTIVITAMIN WITH MINERALS) tablet, Take 1 tablet by mouth in the morning.  Centrum Silver, Disp: , Rfl:    niacin 500 MG tablet, Take 500 mg by mouth in the morning., Disp: , Rfl:    olmesartan (BENICAR) 5 MG tablet, Take 10 mg by mouth daily., Disp: , Rfl:    potassium chloride (KLOR-CON M) 10 MEQ tablet, Take 10 mEq by mouth daily., Disp: , Rfl:    tadalafil (CIALIS) 20 MG tablet, TAKE 2 TABLETS BY MOUTH DAILY, Disp: 180 tablet, Rfl: 3   torsemide (DEMADEX) 20 MG tablet, Take 4 tablets (80 mg total) by mouth every morning AND 2 tablets (40 mg total) every evening., Disp: 220 tablet, Rfl: 5   warfarin (COUMADIN) 5 MG tablet, Take 5-7.5 mg by mouth See  admin instructions. Take 5 mg on Saturday,Sunday Tues, Wed, Thursday, and then  Take 7.5 mg on Monday,  Wednesday, and Friday, Disp: , Rfl:    amLODipine (NORVASC) 10 MG tablet, Take 10 mg by mouth daily. (Patient not taking: Reported on 01/23/2024), Disp: , Rfl:   Past Medical History: Past Medical History:  Diagnosis Date   (HFpEF) heart failure with preserved ejection fraction (HCC)    Arthritis    Atrial fibrillation (HCC)    Bradycardia    Carotid artery disease (HCC)    Hyperlipidemia    Hypertension    Pulmonary hypertension (HCC)    Type 2 diabetes mellitus (HCC)     Tobacco Use: Social History   Tobacco Use  Smoking Status Never   Passive exposure: Never  Smokeless Tobacco Never  Tobacco Comments   Never smoke 05/18/22    Labs: Review Flowsheet  More data exists      Latest Ref Rng & Units 05/14/2023 05/21/2023 11/13/2023 11/14/2023 12/18/2023  Labs for ITP Cardiac and Pulmonary Rehab  Cholestrol 0 - 200 mg/dL 98  - - - 409   LDL (calc) 0 - 99 mg/dL 47  - - - 50   HDL-C >81 mg/dL 42  - - - 59   Trlycerides <150 mg/dL 44  - - - 45   Hemoglobin A1c 4.8 - 5.6 % - - -  7.4  -  PH, Arterial 7.35 - 7.45 - 7.402  7.415  - - -  PCO2 arterial 32 - 48 mmHg - 41.4  40.9  - - -  Bicarbonate 20.0 - 28.0 mmol/L - 25.7  26.2  31.9  - -  TCO2 22 - 32 mmol/L - 27  27  29   - -  O2 Saturation % - 73  75  86.4  - -    Details       Multiple values from one day are sorted in reverse-chronological order         Capillary Blood Glucose: Lab Results  Component Value Date   GLUCAP 192 (H) 11/29/2023   GLUCAP 116 (H) 11/29/2023   GLUCAP 156 (H) 11/28/2023   GLUCAP 155 (H) 11/28/2023   GLUCAP 117 (H) 11/28/2023     Pulmonary Assessment Scores:  Pulmonary Assessment Scores     Row Name 01/23/24 1520         ADL UCSD   ADL Phase Entry     SOB Score total 23     Rest 0     Walk 3     Stairs 3     Bath 2     Dress 2     Shop 0       CAT Score   CAT Score 10        mMRC Score   mMRC Score 2             UCSD: Self-administered rating of dyspnea associated with activities of daily living (ADLs) 6-point scale (0 = "not at all" to 5 = "maximal or unable to do because of breathlessness")  Scoring Scores range from 0 to 120.  Minimally important difference is 5 units  CAT: CAT can identify the health impairment of COPD patients and is better correlated with disease progression.  CAT has a scoring range of zero to 40. The CAT score is classified into four groups of low (less than 10), medium (10 - 20), high (21-30) and very high (31-40) based on the impact level of disease on health status. A CAT score over 10 suggests significant symptoms.  A worsening CAT score could be explained by an exacerbation, poor medication adherence, poor inhaler technique, or progression of COPD or comorbid conditions.  CAT MCID is 2 points  mMRC: mMRC (Modified Medical Research Council) Dyspnea Scale is used to assess the degree of baseline functional disability in patients of respiratory disease due to dyspnea. No minimal important difference is established. A decrease in score of 1 point or greater is considered a positive change.   Pulmonary Function Assessment:   Exercise Target Goals: Exercise Program Goal: Individual exercise prescription set using results from initial 6 min walk test and THRR while considering  patient's activity barriers and safety.   Exercise Prescription Goal: Initial exercise prescription builds to 30-45 minutes a day of aerobic activity, 2-3 days per week.  Home exercise guidelines will be given to patient during program as part of exercise prescription that the participant will acknowledge.  Activity Barriers & Risk Stratification:  Activity Barriers & Cardiac Risk Stratification - 01/14/24 1318       Activity Barriers & Cardiac Risk Stratification   Activity Barriers Deconditioning;Assistive Device             6 Minute Walk:   6 Minute Walk     Row Name 01/23/24 1445         6 Minute Walk  Phase Initial     Distance 860 feet     Walk Time 6 minutes     # of Rest Breaks 0     MPH 1.63     METS 1.29     RPE 12     Perceived Dyspnea  2     VO2 Peak 4.52     Symptoms No     Resting HR 73 bpm     Resting BP 180/80     Resting Oxygen Saturation  90 %     Exercise Oxygen Saturation  during 6 min walk 85 %     Max Ex. HR 78 bpm     Max Ex. BP 164/70     2 Minute Post BP 148/70       Interval HR   1 Minute HR 66     2 Minute HR 68     3 Minute HR 70     4 Minute HR 71     5 Minute HR 76     6 Minute HR 78     2 Minute Post HR 53     Interval Heart Rate? Yes       Interval Oxygen   Interval Oxygen? Yes     Baseline Oxygen Saturation % 90 %     1 Minute Oxygen Saturation % 86 %     1 Minute Liters of Oxygen 0 L     2 Minute Oxygen Saturation % 86 %     2 Minute Liters of Oxygen 0 L     3 Minute Oxygen Saturation % 86 %     3 Minute Liters of Oxygen 0 L     4 Minute Oxygen Saturation % 86 %     4 Minute Liters of Oxygen 0 L     5 Minute Oxygen Saturation % 85 %     5 Minute Liters of Oxygen 0 L     6 Minute Oxygen Saturation % 86 %     6 Minute Liters of Oxygen 0 L     2 Minute Post Oxygen Saturation % 91 %     2 Minute Post Liters of Oxygen 0 L              Oxygen Initial Assessment:  Oxygen Initial Assessment - 01/14/24 1312       Home Oxygen   Home Oxygen Device None    Sleep Oxygen Prescription None    Home Exercise Oxygen Prescription None    Home Resting Oxygen Prescription None    Compliance with Home Oxygen Use Yes      Intervention   Short Term Goals To learn and exhibit compliance with exercise, home and travel O2 prescription;To learn and understand importance of monitoring SPO2 with pulse oximeter and demonstrate accurate use of the pulse oximeter.;To learn and understand importance of maintaining oxygen saturations>88%;To learn and demonstrate proper pursed lip  breathing techniques or other breathing techniques. ;To learn and demonstrate proper use of respiratory medications    Long  Term Goals Exhibits compliance with exercise, home  and travel O2 prescription;Maintenance of O2 saturations>88%;Compliance with respiratory medication;Demonstrates proper use of MDI's;Exhibits proper breathing techniques, such as pursed lip breathing or other method taught during program session;Verbalizes importance of monitoring SPO2 with pulse oximeter and return demonstration             Oxygen Re-Evaluation:   Oxygen Discharge (Final Oxygen Re-Evaluation):   Initial Exercise Prescription:  Initial  Exercise Prescription - 01/23/24 1400       Date of Initial Exercise RX and Referring Provider   Date 01/23/24    Referring Provider Marca Ancona MD      Oxygen   Maintain Oxygen Saturation 88% or higher      NuStep   Level 2    SPM 60    Minutes 15    METs 1.9      Track   Laps 15    Minutes 15    METs 1.9      Prescription Details   Frequency (times per week) 2    Duration Progress to 30 minutes of continuous aerobic without signs/symptoms of physical distress      Intensity   THRR 40-80% of Max Heartrate 81-119    Ratings of Perceived Exertion 11-13    Perceived Dyspnea 0-4      Resistance Training   Training Prescription Yes    Weight 3    Reps 10-15             Perform Capillary Blood Glucose checks as needed.  Exercise Prescription Changes:   Exercise Prescription Changes     Row Name 01/23/24 1400             Response to Exercise   Blood Pressure (Admit) 180/80       Blood Pressure (Exercise) 164/70       Blood Pressure (Exit) 148/70       Heart Rate (Admit) 43 bpm       Heart Rate (Exercise) 78 bpm       Heart Rate (Exit) 53 bpm       Oxygen Saturation (Admit) 90 %       Oxygen Saturation (Exercise) 80 %       Oxygen Saturation (Exit) 91 %       Rating of Perceived Exertion (Exercise) 12       Perceived  Dyspnea (Exercise) 2       Duration Continue with 30 min of aerobic exercise without signs/symptoms of physical distress.       Intensity THRR unchanged         Progression   Progression Continue to progress workloads to maintain intensity without signs/symptoms of physical distress.                Exercise Comments:   Exercise Goals and Review:   Exercise Goals     Row Name 01/14/24 1333             Exercise Goals   Increase Physical Activity Yes       Intervention Provide advice, education, support and counseling about physical activity/exercise needs.;Develop an individualized exercise prescription for aerobic and resistive training based on initial evaluation findings, risk stratification, comorbidities and participant's personal goals.       Expected Outcomes Short Term: Attend rehab on a regular basis to increase amount of physical activity.;Long Term: Add in home exercise to make exercise part of routine and to increase amount of physical activity.;Long Term: Exercising regularly at least 3-5 days a week.       Increase Strength and Stamina Yes       Intervention Provide advice, education, support and counseling about physical activity/exercise needs.;Develop an individualized exercise prescription for aerobic and resistive training based on initial evaluation findings, risk stratification, comorbidities and participant's personal goals.       Expected Outcomes Short Term: Increase workloads from initial exercise prescription for resistance, speed, and METs.;Short  Term: Perform resistance training exercises routinely during rehab and add in resistance training at home;Long Term: Improve cardiorespiratory fitness, muscular endurance and strength as measured by increased METs and functional capacity ( )       Able to understand and use rate of perceived exertion (RPE) scale Yes       Intervention Provide education and explanation on how to use RPE scale       Expected  Outcomes Short Term: Able to use RPE daily in rehab to express subjective intensity level;Long Term:  Able to use RPE to guide intensity level when exercising independently       Able to understand and use Dyspnea scale Yes       Intervention Provide education and explanation on how to use Dyspnea scale       Expected Outcomes Short Term: Able to use Dyspnea scale daily in rehab to express subjective sense of shortness of breath during exertion;Long Term: Able to use Dyspnea scale to guide intensity level when exercising independently       Knowledge and understanding of Target Heart Rate Range (THRR) Yes       Intervention Provide education and explanation of THRR including how the numbers were predicted and where they are located for reference       Expected Outcomes Short Term: Able to use daily as guideline for intensity in rehab;Long Term: Able to use THRR to govern intensity when exercising independently;Short Term: Able to state/look up THRR       Able to check pulse independently Yes       Intervention Provide education and demonstration on how to check pulse in carotid and radial arteries.;Review the importance of being able to check your own pulse for safety during independent exercise       Expected Outcomes Short Term: Able to explain why pulse checking is important during independent exercise;Long Term: Able to check pulse independently and accurately       Understanding of Exercise Prescription Yes       Intervention Provide education, explanation, and written materials on patient's individual exercise prescription       Expected Outcomes Short Term: Able to explain program exercise prescription;Long Term: Able to explain home exercise prescription to exercise independently                Exercise Goals Re-Evaluation :   Discharge Exercise Prescription (Final Exercise Prescription Changes):  Exercise Prescription Changes - 01/23/24 1400       Response to Exercise   Blood  Pressure (Admit) 180/80    Blood Pressure (Exercise) 164/70    Blood Pressure (Exit) 148/70    Heart Rate (Admit) 43 bpm    Heart Rate (Exercise) 78 bpm    Heart Rate (Exit) 53 bpm    Oxygen Saturation (Admit) 90 %    Oxygen Saturation (Exercise) 80 %    Oxygen Saturation (Exit) 91 %    Rating of Perceived Exertion (Exercise) 12    Perceived Dyspnea (Exercise) 2    Duration Continue with 30 min of aerobic exercise without signs/symptoms of physical distress.    Intensity THRR unchanged      Progression   Progression Continue to progress workloads to maintain intensity without signs/symptoms of physical distress.             Nutrition:  Target Goals: Understanding of nutrition guidelines, daily intake of sodium 1500mg , cholesterol 200mg , calories 30% from fat and 7% or less from saturated fats, daily to have 5  or more servings of fruits and vegetables.  Biometrics:  Pre Biometrics - 01/23/24 1450       Pre Biometrics   Height 5\' 4"  (1.626 m)    Weight 78 kg    Waist Circumference 40 inches    Hip Circumference 41 inches    Waist to Hip Ratio 0.98 %    BMI (Calculated) 29.5    Grip Strength 10.4 kg              Nutrition Therapy Plan and Nutrition Goals:   Nutrition Assessments:  MEDIFICTS Score Key: >=70 Need to make dietary changes  40-70 Heart Healthy Diet <= 40 Therapeutic Level Cholesterol Diet  Flowsheet Row PULMONARY REHAB OTHER RESP ORIENTATION from 01/23/2024 in [facility] CARDIAC REHABILITATION  Picture Your Plate Total Score on Admission 40      Picture Your Plate Scores: <16 Unhealthy dietary pattern with much room for improvement. 41-50 Dietary pattern unlikely to meet recommendations for good health and room for improvement. 51-60 More healthful dietary pattern, with some room for improvement.  >60 Healthy dietary pattern, although there may be some specific behaviors that could be improved.    Nutrition Goals  Re-Evaluation:   Nutrition Goals Discharge (Final Nutrition Goals Re-Evaluation):   Psychosocial: Target Goals: Acknowledge presence or absence of significant depression and/or stress, maximize coping skills, provide positive support system. Participant is able to verbalize types and ability to use techniques and skills needed for reducing stress and depression.  Initial Review & Psychosocial Screening:  Initial Psych Review & Screening - 01/14/24 1305       Initial Review   Current issues with None Identified      Family Dynamics   Good Support System? Yes   Wife lives with him.     Barriers   Psychosocial barriers to participate in program There are no identifiable barriers or psychosocial needs.      Screening Interventions   Interventions Encouraged to exercise    Expected Outcomes Short Term goal: Utilizing psychosocial counselor, staff and physician to assist with identification of specific Stressors or current issues interfering with healing process. Setting desired goal for each stressor or current issue identified.;Long Term Goal: Stressors or current issues are controlled or eliminated.;Short Term goal: Identification and review with participant of any Quality of Life or Depression concerns found by scoring the questionnaire.;Long Term goal: The participant improves quality of Life and PHQ9 Scores as seen by post scores and/or verbalization of changes             Quality of Life Scores:  Scores of 19 and below usually indicate a poorer quality of life in these areas.  A difference of  2-3 points is a clinically meaningful difference.  A difference of 2-3 points in the total score of the Quality of Life Index has been associated with significant improvement in overall quality of life, self-image, physical symptoms, and general health in studies assessing change in quality of life.   PHQ-9: Review Flowsheet       01/23/2024  Depression screen PHQ 2/9  Decreased  Interest 0  Down, Depressed, Hopeless 0  PHQ - 2 Score 0  Altered sleeping 0  Tired, decreased energy 0  Change in appetite 0  Feeling bad or failure about yourself  0  Trouble concentrating 0  Moving slowly or fidgety/restless 0  Suicidal thoughts 0  PHQ-9 Score 0   Interpretation of Total Score  Total Score Depression Severity:  1-4 = Minimal depression,  5-9 = Mild depression, 10-14 = Moderate depression, 15-19 = Moderately severe depression, 20-27 = Severe depression   Psychosocial Evaluation and Intervention:  Psychosocial Evaluation - 01/14/24 1308       Psychosocial Evaluation & Interventions   Interventions Relaxation education;Encouraged to exercise with the program and follow exercise prescription    Comments Jamani is a pleasant gentleman who is coming to rehab for a diagnosis of pulmonary HTN. He was recently hospitalized on 1/25 with PNA where he got discharged with O2, but hasn't used it in the last little while as he states he doesn't need it. Oren uses a cane for ambulation, which he says he doesn't need it but because his wife wants him to use it for safety reasons. He lives at home with his wife and dog who are his support system. 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 He denies any current stressors in life. His current exercise consists of what the home PT tells him to do, which he says he has 2 more sessions of that left. Edelmiro is eager to come exercise to get his strenth back.    Expected Outcomes Short: Increase strength and stamina. Long: Be able to walk without a cane.    Continue Psychosocial Services  Follow up required by staff             Psychosocial Re-Evaluation:   Psychosocial Discharge (Final Psychosocial Re-Evaluation):    Education: Education Goals: Education classes will be provided on a weekly basis, covering required topics. Participant will state understanding/return demonstration of topics  presented.  Learning Barriers/Preferences:  Learning Barriers/Preferences - 01/14/24 1308       Learning Barriers/Preferences   Learning Barriers Sight   Wears glasses   Learning Preferences None             Education Topics: How Lungs Work and Diseases: - Discuss the anatomy of the lungs and diseases that can affect the lungs, such as COPD.   Exercise: -Discuss the importance of exercise, FITT principles of exercise, normal and abnormal responses to exercise, and how to exercise safely.   Environmental Irritants: -Discuss types of environmental irritants and how to limit exposure to environmental irritants.   Meds/Inhalers and oxygen: - Discuss respiratory medications, definition of an inhaler and oxygen, and the proper way to use an inhaler and oxygen.   Energy Saving Techniques: - Discuss methods to conserve energy and decrease shortness of breath when performing activities of daily living.    Bronchial Hygiene / Breathing Techniques: - Discuss breathing mechanics, pursed-lip breathing technique,  proper posture, effective ways to clear airways, and other functional breathing techniques   Cleaning Equipment: - Provides group verbal and written instruction about the health risks of elevated stress, cause of high stress, and healthy ways to reduce stress.   Nutrition I: Fats: - Discuss the types of cholesterol, what cholesterol does to the body, and how cholesterol levels can be controlled.   Nutrition II: Labels: -Discuss the different components of food labels and how to read food labels.   Respiratory Infections: - Discuss the signs and symptoms of respiratory infections, ways to prevent respiratory infections, and the importance of seeking medical treatment when having a respiratory infection.   Stress I: Signs and Symptoms: - Discuss the causes of stress, how stress may lead to anxiety and depression, and ways to limit stress.   Stress II:  Relaxation: -Discuss relaxation techniques to limit stress.   Oxygen  for Home/Travel: - Discuss how to prepare for travel when on oxygen and proper ways to transport and store oxygen to ensure safety.   Knowledge Questionnaire Score:  Knowledge Questionnaire Score - 01/23/24 1520       Knowledge Questionnaire Score   Pre Score 12/18             Core Components/Risk Factors/Patient Goals at Admission:  Personal Goals and Risk Factors at Admission - 01/14/24 1358       Core Components/Risk Factors/Patient Goals on Admission    Weight Management Yes    Intervention Weight Management: Develop a combined nutrition and exercise program designed to reach desired caloric intake, while maintaining appropriate intake of nutrient and fiber, sodium and fats, and appropriate energy expenditure required for the weight goal.;Weight Management: Provide education and appropriate resources to help participant work on and attain dietary goals.;Weight Management/Obesity: Establish reasonable short term and long term weight goals.;Obesity: Provide education and appropriate resources to help participant work on and attain dietary goals.    Expected Outcomes Short Term: Continue to assess and modify interventions until short term weight is achieved;Long Term: Adherence to nutrition and physical activity/exercise program aimed toward attainment of established weight goal;Weight Maintenance: Understanding of the daily nutrition guidelines, which includes 25-35% calories from fat, 7% or less cal from saturated fats, less than 200mg  cholesterol, less than 1.5gm of sodium, & 5 or more servings of fruits and vegetables daily;Understanding recommendations for meals to include 15-35% energy as protein, 25-35% energy from fat, 35-60% energy from carbohydrates, less than 200mg  of dietary cholesterol, 20-35 gm of total fiber daily    Improve shortness of breath with ADL's Yes    Intervention Provide education,  individualized exercise plan and daily activity instruction to help decrease symptoms of SOB with activities of daily living.    Expected Outcomes Short Term: Improve cardiorespiratory fitness to achieve a reduction of symptoms when performing ADLs;Long Term: Be able to perform more ADLs without symptoms or delay the onset of symptoms    Increase knowledge of respiratory medications and ability to use respiratory devices properly  Yes    Intervention Provide education and demonstration as needed of appropriate use of medications, inhalers, and oxygen therapy.    Expected Outcomes Short Term: Achieves understanding of medications use. Understands that oxygen is a medication prescribed by physician. Demonstrates appropriate use of inhaler and oxygen therapy.;Long Term: Maintain appropriate use of medications, inhalers, and oxygen therapy.    Diabetes Yes    Intervention Provide education about proper nutrition, including hydration, and aerobic/resistive exercise prescription along with prescribed medications to achieve blood glucose in normal ranges: Fasting glucose 65-99 mg/dL;Provide education about signs/symptoms and action to take for hypo/hyperglycemia.    Expected Outcomes Short Term: Participant verbalizes understanding of the signs/symptoms and immediate care of hyper/hypoglycemia, proper foot care and importance of medication, aerobic/resistive exercise and nutrition plan for blood glucose control.;Long Term: Attainment of HbA1C < 7%.    Hypertension Yes    Intervention Monitor prescription use compliance.;Provide education on lifestyle modifcations including regular physical activity/exercise, weight management, moderate sodium restriction and increased consumption of fresh fruit, vegetables, and low fat dairy, alcohol moderation, and smoking cessation.    Expected Outcomes Short Term: Continued assessment and intervention until BP is < 140/4mm HG in hypertensive participants. < 130/33mm HG in  hypertensive participants with diabetes, heart failure or chronic kidney disease.;Long Term: Maintenance of blood pressure at goal levels.    Lipids Yes    Intervention Provide education  and support for participant on nutrition & aerobic/resistive exercise along with prescribed medications to achieve LDL 70mg , HDL >40mg .    Expected Outcomes Short Term: Participant states understanding of desired cholesterol values and is compliant with medications prescribed. Participant is following exercise prescription and nutrition guidelines.;Long Term: Cholesterol controlled with medications as prescribed, with individualized exercise RX and with personalized nutrition plan. Value goals: LDL < 70mg , HDL > 40 mg.             Core Components/Risk Factors/Patient Goals Review:    Core Components/Risk Factors/Patient Goals at Discharge (Final Review):    ITP Comments:  ITP Comments     Row Name 01/14/24 1316           ITP Comments Completed virtual orientation today.  EP evaluation is scheduled for 01/23/24 at 1:30pm .  Documentation for diagnosis can be found in [facility] encounter 12/18/23.                Comments: Patient arrived for 1st visit/orientation/education at 1315. Patient was referred to PR by Marca Ancona, MD due to Pulmonary HTN. During orientation advised patient on arrival and appointment times what to wear, what to do before, during and after exercise. Reviewed attendance and class policy.  Pt is scheduled to return Pulmonary Rehab on 01/27/24 at 915. Pt was advised to come to class 15 minutes before class starts.  Discussed RPE/Dpysnea scales. Patient participated in warm up stretches. Patient was able to complete 6 minute walk test.  Patient was measured for the equipment. Discussed equipment safety with patient. Took patient pre-anthropometric measurements. Patient finished visit at 1415.

## 2024-01-27 ENCOUNTER — Encounter (HOSPITAL_COMMUNITY)
Admission: RE | Admit: 2024-01-27 | Discharge: 2024-01-27 | Disposition: A | Discharge status: Home / Self Care | Source: Ambulatory Visit | Attending: Cardiology | Admitting: Cardiology

## 2024-01-27 DIAGNOSIS — I272 Pulmonary hypertension, unspecified: Secondary | ICD-10-CM | POA: Diagnosis not present

## 2024-01-27 DIAGNOSIS — I50813 Acute on chronic right heart failure: Secondary | ICD-10-CM | POA: Diagnosis not present

## 2024-01-27 LAB — GLUCOSE, CAPILLARY
Glucose-Capillary: 139 mg/dL — ABNORMAL HIGH (ref 70–99)
Glucose-Capillary: 152 mg/dL — ABNORMAL HIGH (ref 70–99)

## 2024-01-27 NOTE — Progress Notes (Signed)
 Daily Session Note  Patient Details  Name: Robert Baldwin MRN: [id number] Date of Birth: 1941-10-19 Referring Provider:   Flowsheet Row PULMONARY REHAB OTHER RESP ORIENTATION from 01/23/2024 in [facility] CARDIAC REHABILITATION  Referring Provider Marca Ancona MD       Encounter Date: 01/27/2024  Check In:  Session Check In - 01/27/24 4401       Check-In   Supervising physician immediately available to respond to emergencies See telemetry face sheet for immediately available MD    Location [facility]    Staff Present Fabio Pierce, MA, RCEP, CCRP, CCET;Brittany Roseanne Reno, BSN, RN, WTA-C    Virtual Visit No    Medication changes reported     No    Fall or balance concerns reported    No    Warm-up and Cool-down Performed on first and last piece of equipment    Resistance Training Performed Yes    VAD Patient? No    PAD/SET Patient? No      Pain Assessment   Currently in Pain? No/denies             Capillary Blood Glucose: Results for orders placed or performed during the hospital encounter of 01/27/24 (from the past 24 hours)  Glucose, capillary     Status: Abnormal   Collection Time: 01/27/24  9:11 AM  Result Value Ref Range   Glucose-Capillary 139 (H) 70 - 99 mg/dL      Social History   Tobacco Use  Smoking Status Never   Passive exposure: Never  Smokeless Tobacco Never  Tobacco Comments   Never smoke 05/18/22    Goals Met:  Proper associated with RPD/PD & O2 Sat Exercise tolerated well Personal goals reviewed Queuing for purse lip breathing No report of concerns or symptoms today Strength training completed today  Goals Unmet:  Not Applicable  Comments: First full day of exercise!  Patient was oriented to gym and equipment including functions, settings, policies, and procedures.  Patient's individual exercise prescription and treatment plan were reviewed.  All starting workloads were established based on the results of the 6 minute  walk test done at initial orientation visit.  The plan for exercise progression was also introduced and progression will be customized based on patient's performance and goals.

## 2024-01-29 ENCOUNTER — Encounter (HOSPITAL_COMMUNITY)
Admission: RE | Admit: 2024-01-29 | Discharge: 2024-01-29 | Disposition: A | Discharge status: Home / Self Care | Source: Ambulatory Visit | Attending: Cardiology

## 2024-01-29 ENCOUNTER — Other Ambulatory Visit (HOSPITAL_COMMUNITY): Payer: Self-pay | Admitting: Family Medicine

## 2024-01-29 ENCOUNTER — Other Ambulatory Visit (HOSPITAL_COMMUNITY): Payer: Self-pay | Admitting: Adult Health

## 2024-01-29 DIAGNOSIS — I272 Pulmonary hypertension, unspecified: Secondary | ICD-10-CM

## 2024-01-29 LAB — GLUCOSE, CAPILLARY
Glucose-Capillary: 138 mg/dL — ABNORMAL HIGH (ref 70–99)
Glucose-Capillary: 130 mg/dL — ABNORMAL HIGH (ref 70–99)

## 2024-01-29 NOTE — Progress Notes (Signed)
 Daily Session Note  Patient Details  Name: Robert Baldwin MRN: [id number] Date of Birth: June 06, 1941 Referring Provider:   Flowsheet Row PULMONARY REHAB OTHER RESP ORIENTATION from 01/23/2024 in [facility] CARDIAC REHABILITATION  Referring Provider Marca Ancona MD       Encounter Date: 01/29/2024  Check In:  Session Check In - 01/29/24 0943       Check-In   Supervising physician immediately available to respond to emergencies See telemetry face sheet for immediately available MD    Location [facility]    Staff Present Ross Ludwig, BS, Exercise Physiologist;Hillary Waveland BSN, RN;Falyn Rubel [place], MA, RCEP, CCRP, CCET    Virtual Visit No    Medication changes reported     No    Fall or balance concerns reported    No    Warm-up and Cool-down Performed on first and last piece of equipment    Resistance Training Performed Yes    VAD Patient? No    PAD/SET Patient? No      Pain Assessment   Currently in Pain? No/denies             Capillary Blood Glucose: Results for orders placed or performed during the hospital encounter of 01/29/24 (from the past 24 hours)  Glucose, capillary     Status: Abnormal   Collection Time: 01/29/24  9:25 AM  Result Value Ref Range   Glucose-Capillary 138 (H) 70 - 99 mg/dL      Social History   Tobacco Use  Smoking Status Never   Passive exposure: Never  Smokeless Tobacco Never  Tobacco Comments   Never smoke 05/18/22    Goals Met:  Proper associated with RPD/PD & O2 Sat Exercise tolerated well Queuing for purse lip breathing No report of concerns or symptoms today Strength training completed today  Goals Unmet:  Not Applicable  Comments: Pt able to follow exercise prescription today without complaint.  Will continue to monitor for progression.

## 2024-02-03 ENCOUNTER — Encounter (HOSPITAL_COMMUNITY)
Admission: RE | Admit: 2024-02-03 | Discharge: 2024-02-03 | Disposition: A | Discharge status: Home / Self Care | Source: Ambulatory Visit | Attending: Cardiology | Admitting: Cardiology

## 2024-02-03 DIAGNOSIS — I272 Pulmonary hypertension, unspecified: Secondary | ICD-10-CM

## 2024-02-03 LAB — GLUCOSE, CAPILLARY: Glucose-Capillary: 155 mg/dL — ABNORMAL HIGH (ref 70–99)

## 2024-02-03 NOTE — Progress Notes (Signed)
 Daily Session Note  Patient Details  Name: Robert Baldwin MRN: [id number] Date of Birth: 08-10-1941 Referring Provider:   Flowsheet Row PULMONARY REHAB OTHER RESP ORIENTATION from 01/23/2024 in [facility] CARDIAC REHABILITATION  Referring Provider Peder Bourdon MD       Encounter Date: 02/03/2024  Check In:  Session Check In - 02/03/24 0915       Check-In   Supervising physician immediately available to respond to emergencies See telemetry face sheet for immediately available MD    Location [facility]    Staff Present Clotilda Danish, BS, Exercise Physiologist;Armani Gawlik Lincoln Renshaw, RN, Juvenal Opoka, RN, BSN    Virtual Visit No    Medication changes reported     No    Fall or balance concerns reported    No    Warm-up and Cool-down Performed on first and last piece of equipment    Resistance Training Performed Yes    VAD Patient? No    PAD/SET Patient? No      Pain Assessment   Currently in Pain? No/denies    Multiple Pain Sites No             Capillary Blood Glucose: Results for orders placed or performed during the hospital encounter of 02/03/24 (from the past 24 hours)  Glucose, capillary     Status: Abnormal   Collection Time: 02/03/24  9:17 AM  Result Value Ref Range   Glucose-Capillary 155 (H) 70 - 99 mg/dL      Social History   Tobacco Use  Smoking Status Never   Passive exposure: Never  Smokeless Tobacco Never  Tobacco Comments   Never smoke 05/18/22    Goals Met:  Proper associated with RPD/PD & O2 Sat Independence with exercise equipment Using PLB without cueing & demonstrates good technique Exercise tolerated well No report of concerns or symptoms today Strength training completed today  Goals Unmet:  Not Applicable  Comments: Pt able to follow exercise prescription today without complaint.  Will continue to monitor for progression.

## 2024-02-05 ENCOUNTER — Encounter (HOSPITAL_COMMUNITY)
Admission: RE | Admit: 2024-02-05 | Discharge: 2024-02-05 | Disposition: A | Discharge status: Home / Self Care | Source: Ambulatory Visit | Attending: Cardiology | Admitting: Cardiology

## 2024-02-05 DIAGNOSIS — I272 Pulmonary hypertension, unspecified: Secondary | ICD-10-CM

## 2024-02-05 LAB — GLUCOSE, CAPILLARY
Glucose-Capillary: 156 mg/dL — ABNORMAL HIGH (ref 70–99)
Glucose-Capillary: 124 mg/dL — ABNORMAL HIGH (ref 70–99)

## 2024-02-05 NOTE — Progress Notes (Signed)
 Daily Session Note  Patient Details  Name: Robert Baldwin MRN: [id number] Date of Birth: April 22, 1941 Referring Provider:   Flowsheet Row PULMONARY REHAB OTHER RESP ORIENTATION from 01/23/2024 in [facility] CARDIAC REHABILITATION  Referring Provider Peder Bourdon MD       Encounter Date: 02/05/2024  Check In:  Session Check In - 02/05/24 0915       Check-In   Supervising physician immediately available to respond to emergencies See telemetry face sheet for immediately available MD    Location [facility]    Staff Present Clotilda Danish, BS, Exercise Physiologist;Hillary Lennie Ra BSN, RN    Virtual Visit No    Medication changes reported     No    Fall or balance concerns reported    No    Tobacco Cessation No Change    Warm-up and Cool-down Performed on first and last piece of equipment    Resistance Training Performed Yes    VAD Patient? No      Pain Assessment   Currently in Pain? No/denies    Multiple Pain Sites No             Capillary Blood Glucose: Results for orders placed or performed during the hospital encounter of 02/05/24 (from the past 24 hours)  Glucose, capillary     Status: Abnormal   Collection Time: 02/05/24  9:26 AM  Result Value Ref Range   Glucose-Capillary 156 (H) 70 - 99 mg/dL      Social History   Tobacco Use  Smoking Status Never   Passive exposure: Never  Smokeless Tobacco Never  Tobacco Comments   Never smoke 05/18/22    Goals Met:  Independence with exercise equipment Exercise tolerated well No report of concerns or symptoms today Strength training completed today  Goals Unmet:  Not Applicable  Comments: Pt able to follow exercise prescription today without complaint.  Will continue to monitor for progression.

## 2024-02-10 ENCOUNTER — Encounter (HOSPITAL_COMMUNITY)
Admission: RE | Admit: 2024-02-10 | Discharge: 2024-02-10 | Disposition: A | Discharge status: Home / Self Care | Source: Ambulatory Visit | Attending: Cardiology | Admitting: Cardiology

## 2024-02-10 DIAGNOSIS — I272 Pulmonary hypertension, unspecified: Secondary | ICD-10-CM | POA: Diagnosis not present

## 2024-02-10 NOTE — Progress Notes (Signed)
 Daily Session Note  Patient Details  Name: Robert Baldwin MRN: [id number] Date of Birth: 1941/02/23 Referring Provider:   Flowsheet Row PULMONARY REHAB OTHER RESP ORIENTATION from 01/23/2024 in [facility] CARDIAC REHABILITATION  Referring Provider Peder Bourdon MD       Encounter Date: 02/10/2024  Check In:  Session Check In - 02/10/24 0915       Check-In   Supervising physician immediately available to respond to emergencies See telemetry face sheet for immediately available MD    Location [facility]    Staff Present Clotilda Danish, BS, Exercise Physiologist;Brittany Foley, BSN, RN;Laureen Bevin Bucks, BS, RRT, CPFT    Virtual Visit No    Medication changes reported     No    Fall or balance concerns reported    No    Tobacco Cessation No Change    Warm-up and Cool-down Performed on first and last piece of equipment    Resistance Training Performed Yes    VAD Patient? No    PAD/SET Patient? No      Pain Assessment   Currently in Pain? No/denies    Multiple Pain Sites No             Capillary Blood Glucose: No results found for this or any previous visit (from the past 24 hours).    Social History   Tobacco Use  Smoking Status Never   Passive exposure: Never  Smokeless Tobacco Never  Tobacco Comments   Never smoke 05/18/22    Goals Met:  Independence with exercise equipment Exercise tolerated well No report of concerns or symptoms today Strength training completed today  Goals Unmet:  Not Applicable  Comments: Pt able to follow exercise prescription today without complaint.  Will continue to monitor for progression.

## 2024-02-11 DIAGNOSIS — Z299 Encounter for prophylactic measures, unspecified: Secondary | ICD-10-CM | POA: Diagnosis not present

## 2024-02-11 DIAGNOSIS — D6869 Other thrombophilia: Secondary | ICD-10-CM | POA: Diagnosis not present

## 2024-02-11 DIAGNOSIS — I4891 Unspecified atrial fibrillation: Secondary | ICD-10-CM | POA: Diagnosis not present

## 2024-02-12 ENCOUNTER — Encounter (HOSPITAL_COMMUNITY): Payer: Self-pay | Admitting: *Deleted

## 2024-02-12 ENCOUNTER — Encounter (HOSPITAL_COMMUNITY)
Admission: RE | Admit: 2024-02-12 | Discharge: 2024-02-12 | Disposition: A | Discharge status: Home / Self Care | Source: Ambulatory Visit | Attending: Cardiology | Admitting: Cardiology

## 2024-02-12 DIAGNOSIS — I272 Pulmonary hypertension, unspecified: Secondary | ICD-10-CM | POA: Diagnosis not present

## 2024-02-12 NOTE — Progress Notes (Signed)
 Daily Session Note  Patient Details  Name: SENAI KINGSLEY MRN: [id number] Date of Birth: [birth year] Referring Provider:   Flowsheet Row PULMONARY REHAB OTHER RESP ORIENTATION from 01/23/2024 in [facility] CARDIAC REHABILITATION  Referring Provider Peder Bourdon MD       Encounter Date: 02/12/2024  Check In:  Session Check In - 02/12/24 4098       Check-In   Staff Present Jerrol Morelle, BSN, RN, WTA-C;Heather Alec Huntington, Exercise Physiologist;Hillary Troutman BSN, RN    Virtual Visit No    Medication changes reported     No    Fall or balance concerns reported    No    Tobacco Cessation No Change    Warm-up and Cool-down Performed on first and last piece of equipment    Resistance Training Performed Yes    VAD Patient? No    PAD/SET Patient? No      Pain Assessment   Currently in Pain? No/denies             Capillary Blood Glucose: No results found for this or any previous visit (from the past 24 hours).    Social History   Tobacco Use  Smoking Status Never   Passive exposure: Never  Smokeless Tobacco Never  Tobacco Comments   Never smoke 05/18/22    Goals Met:  Proper associated with RPD/PD & O2 Sat Independence with exercise equipment Improved SOB with ADL's Using PLB without cueing & demonstrates good technique Exercise tolerated well No report of concerns or symptoms today Strength training completed today  Goals Unmet:  Not Applicable  Comments: Pt able to follow exercise prescription today without complaint.  Will continue to monitor for progression.

## 2024-02-12 NOTE — Progress Notes (Signed)
 Pulmonary Individual Treatment Plan  Patient Details  Name: Robert Baldwin MRN: [id number] Date of Birth: October 03, 1941 Referring Provider:   Flowsheet Row PULMONARY REHAB OTHER RESP ORIENTATION from 01/23/2024 in [facility] CARDIAC REHABILITATION  Referring Provider Peder Bourdon MD       Initial Encounter Date:  Flowsheet Row PULMONARY REHAB OTHER RESP ORIENTATION from 01/23/2024 in [facility] [place] CARDIAC REHABILITATION  Date 01/23/24       Visit Diagnosis: Pulmonary hypertension (HCC)  Patient's Home Medications on Admission:   Current Outpatient Medications:    amLODipine  (NORVASC ) 10 MG tablet, Take 10 mg by mouth daily. (Patient not taking: Reported on 01/23/2024), Disp: , Rfl:    atorvastatin  (LIPITOR) 40 MG tablet, Take 40 mg by mouth at bedtime., Disp: , Rfl:    cholecalciferol  (VITAMIN D ) 25 MCG (1000 UNIT) tablet, Take 1,000 Units by mouth daily., Disp: , Rfl:    FARXIGA  10 MG TABS tablet, TAKE 1 TABLET BY MOUTH DAILY, Disp: 30 tablet, Rfl: 4   fish oil-omega-3 fatty acids  1000 MG capsule, Take 1 g by mouth every evening., Disp: , Rfl:    hydrALAZINE  (APRESOLINE ) 25 MG tablet, Take 25 mg by mouth 2 (two) times daily., Disp: , Rfl:    macitentan  (OPSUMIT ) 10 MG tablet, TAKE 1 TABLET BY MOUTH DAILY, Disp: 90 tablet, Rfl: 3   Misc Natural Products (GLUCOS-CHONDROIT-MSM COMPLEX) TABS, Take 2 tablets by mouth daily., Disp: , Rfl:    Multiple Vitamins-Minerals (MULTIVITAMIN WITH MINERALS) tablet, Take 1 tablet by mouth in the morning.  Centrum Silver, Disp: , Rfl:    niacin  500 MG tablet, Take 500 mg by mouth in the morning., Disp: , Rfl:    olmesartan (BENICAR) 5 MG tablet, Take 10 mg by mouth daily., Disp: , Rfl:    potassium chloride  (KLOR-CON  M) 10 MEQ tablet, Take 10 mEq by mouth daily., Disp: , Rfl:    tadalafil  (CIALIS ) 20 MG tablet, TAKE 2 TABLETS BY MOUTH DAILY, Disp: 180 tablet, Rfl: 3   torsemide  (DEMADEX ) 20 MG tablet, TAKE 4 TABLETS BY MOUTH EVERY MORNING AND 2 TABLETS EVERY  EVENING, Disp: 220 tablet, Rfl: 5   warfarin (COUMADIN ) 5 MG tablet, Take 5-7.5 mg by mouth See admin instructions. Take 5 mg on Saturday,Sunday Tues, Wed, Thursday, and then  Take 7.5 mg on Monday,  Wednesday, and Friday, Disp: , Rfl:   Past Medical History: Past Medical History:  Diagnosis Date   (HFpEF) heart failure with preserved ejection fraction (HCC)    Arthritis    Atrial fibrillation (HCC)    Bradycardia    Carotid artery disease (HCC)    Hyperlipidemia    Hypertension    Pulmonary hypertension (HCC)    Type 2 diabetes mellitus (HCC)     Tobacco Use: Social History   Tobacco Use  Smoking Status Never   Passive exposure: Never  Smokeless Tobacco Never  Tobacco Comments   Never smoke 05/18/22    Labs: Review Flowsheet  More data exists      Latest Ref Rng & Units 05/14/2023 05/21/2023 11/13/2023 11/14/2023 12/18/2023  Labs for ITP Cardiac and Pulmonary Rehab  Cholestrol 0 - 200 mg/dL 98  - - - 562   LDL (calc) 0 - 99 mg/dL 47  - - - 50   HDL-C >13 mg/dL 42  - - - 59   Trlycerides <150 mg/dL 44  - - - 45   Hemoglobin A1c 4.8 - 5.6 % - - - 7.4  -  PH, Arterial  7.35 - 7.45 - 7.402  7.415  - - -  PCO2 arterial 32 - 48 mmHg - 41.4  40.9  - - -  Bicarbonate 20.0 - 28.0 mmol/L - 25.7  26.2  31.9  - -  TCO2 22 - 32 mmol/L - 27  27  29   - -  O2 Saturation % - 73  75  86.4  - -    Details       Multiple values from one day are sorted in reverse-chronological order         Capillary Blood Glucose: Lab Results  Component Value Date   GLUCAP 124 (H) 02/05/2024   GLUCAP 156 (H) 02/05/2024   GLUCAP 155 (H) 02/03/2024   GLUCAP 130 (H) 01/29/2024   GLUCAP 138 (H) 01/29/2024     Pulmonary Assessment Scores:  Pulmonary Assessment Scores     Row Name 01/23/24 1520         ADL UCSD   ADL Phase Entry     SOB Score total 23     Rest 0     Walk 3     Stairs 3     Bath 2     Dress 2     Shop 0       CAT Score   CAT Score 10       mMRC Score   mMRC Score  2             UCSD: Self-administered rating of dyspnea associated with activities of daily living (ADLs) 6-point scale (0 = "not at all" to 5 = "maximal or unable to do because of breathlessness")  Scoring Scores range from 0 to 120.  Minimally important difference is 5 units  CAT: CAT can identify the health impairment of COPD patients and is better correlated with disease progression.  CAT has a scoring range of zero to 40. The CAT score is classified into four groups of low (less than 10), medium (10 - 20), high (21-30) and very high (31-40) based on the impact level of disease on health status. A CAT score over 10 suggests significant symptoms.  A worsening CAT score could be explained by an exacerbation, poor medication adherence, poor inhaler technique, or progression of COPD or comorbid conditions.  CAT MCID is 2 points  mMRC: mMRC (Modified Medical Research Council) Dyspnea Scale is used to assess the degree of baseline functional disability in patients of respiratory disease due to dyspnea. No minimal important difference is established. A decrease in score of 1 point or greater is considered a positive change.   Pulmonary Function Assessment:   Exercise Target Goals: Exercise Program Goal: Individual exercise prescription set using results from initial 6 min walk test and THRR while considering  patient's activity barriers and safety.   Exercise Prescription Goal: Initial exercise prescription builds to 30-45 minutes a day of aerobic activity, 2-3 days per week.  Home exercise guidelines will be given to patient during program as part of exercise prescription that the participant will acknowledge.  Activity Barriers & Risk Stratification:  Activity Barriers & Cardiac Risk Stratification - 01/14/24 1318       Activity Barriers & Cardiac Risk Stratification   Activity Barriers Deconditioning;Assistive Device             6 Minute Walk:  6 Minute Walk     Row  Name 01/23/24 1445         6 Minute Walk   Phase Initial  Distance 860 feet     Walk Time 6 minutes     # of Rest Breaks 0     MPH 1.63     METS 1.29     RPE 12     Perceived Dyspnea  2     VO2 Peak 4.52     Symptoms No     Resting HR 73 bpm     Resting BP 180/80     Resting Oxygen  Saturation  90 %     Exercise Oxygen  Saturation  during 6 min walk 85 %     Max Ex. HR 78 bpm     Max Ex. BP 164/70     2 Minute Post BP 148/70       Interval HR   1 Minute HR 66     2 Minute HR 68     3 Minute HR 70     4 Minute HR 71     5 Minute HR 76     6 Minute HR 78     2 Minute Post HR 53     Interval Heart Rate? Yes       Interval Oxygen    Interval Oxygen ? Yes     Baseline Oxygen  Saturation % 90 %     1 Minute Oxygen  Saturation % 86 %     1 Minute Liters of Oxygen  0 L     2 Minute Oxygen  Saturation % 86 %     2 Minute Liters of Oxygen  0 L     3 Minute Oxygen  Saturation % 86 %     3 Minute Liters of Oxygen  0 L     4 Minute Oxygen  Saturation % 86 %     4 Minute Liters of Oxygen  0 L     5 Minute Oxygen  Saturation % 85 %     5 Minute Liters of Oxygen  0 L     6 Minute Oxygen  Saturation % 86 %     6 Minute Liters of Oxygen  0 L     2 Minute Post Oxygen  Saturation % 91 %     2 Minute Post Liters of Oxygen  0 L              Oxygen  Initial Assessment:  Oxygen  Initial Assessment - 01/14/24 1312       Home Oxygen    Home Oxygen  Device None    Sleep Oxygen  Prescription None    Home Exercise Oxygen  Prescription None    Home Resting Oxygen  Prescription None    Compliance with Home Oxygen  Use Yes      Intervention   Short Term Goals To learn and exhibit compliance with exercise, home and travel O2 prescription;To learn and understand importance of monitoring SPO2 with pulse oximeter and demonstrate accurate use of the pulse oximeter.;To learn and understand importance of maintaining oxygen  saturations>88%;To learn and demonstrate proper pursed lip breathing techniques or other  breathing techniques. ;To learn and demonstrate proper use of respiratory medications    Long  Term Goals Exhibits compliance with exercise, home  and travel O2 prescription;Maintenance of O2 saturations>88%;Compliance with respiratory medication;Demonstrates proper use of MDI's;Exhibits proper breathing techniques, such as pursed lip breathing or other method taught during program session;Verbalizes importance of monitoring SPO2 with pulse oximeter and return demonstration             Oxygen  Re-Evaluation:  Oxygen  Re-Evaluation     Row Name 01/27/24 [phone]  Program Oxygen  Prescription   Program Oxygen  Prescription None         Home Oxygen    Home Oxygen  Device None       Sleep Oxygen  Prescription None       Home Exercise Oxygen  Prescription None       Home Resting Oxygen  Prescription None       Compliance with Home Oxygen  Use Yes         Goals/Expected Outcomes   Short Term Goals To learn and exhibit compliance with exercise, home and travel O2 prescription;To learn and understand importance of monitoring SPO2 with pulse oximeter and demonstrate accurate use of the pulse oximeter.;To learn and understand importance of maintaining oxygen  saturations>88%;To learn and demonstrate proper pursed lip breathing techniques or other breathing techniques. ;To learn and demonstrate proper use of respiratory medications       Long  Term Goals Exhibits compliance with exercise, home  and travel O2 prescription;Maintenance of O2 saturations>88%;Compliance with respiratory medication;Demonstrates proper use of MDI's;Exhibits proper breathing techniques, such as pursed lip breathing or other method taught during program session;Verbalizes importance of monitoring SPO2 with pulse oximeter and return demonstration       Comments Reviewed PLB technique with pt.  Talked about how it works and it's importance in maintaining their exercise saturations.       Goals/Expected Outcomes Short: Become  more profiecient at using PLB.   Long: Become independent at using PLB.                Oxygen  Discharge (Final Oxygen  Re-Evaluation):  Oxygen  Re-Evaluation - 01/27/24 0924       Program Oxygen  Prescription   Program Oxygen  Prescription None      Home Oxygen    Home Oxygen  Device None    Sleep Oxygen  Prescription None    Home Exercise Oxygen  Prescription None    Home Resting Oxygen  Prescription None    Compliance with Home Oxygen  Use Yes      Goals/Expected Outcomes   Short Term Goals To learn and exhibit compliance with exercise, home and travel O2 prescription;To learn and understand importance of monitoring SPO2 with pulse oximeter and demonstrate accurate use of the pulse oximeter.;To learn and understand importance of maintaining oxygen  saturations>88%;To learn and demonstrate proper pursed lip breathing techniques or other breathing techniques. ;To learn and demonstrate proper use of respiratory medications    Long  Term Goals Exhibits compliance with exercise, home  and travel O2 prescription;Maintenance of O2 saturations>88%;Compliance with respiratory medication;Demonstrates proper use of MDI's;Exhibits proper breathing techniques, such as pursed lip breathing or other method taught during program session;Verbalizes importance of monitoring SPO2 with pulse oximeter and return demonstration    Comments Reviewed PLB technique with pt.  Talked about how it works and it's importance in maintaining their exercise saturations.    Goals/Expected Outcomes Short: Become more profiecient at using PLB.   Long: Become independent at using PLB.             Initial Exercise Prescription:  Initial Exercise Prescription - 01/23/24 1400       Date of Initial Exercise RX and Referring Provider   Date 01/23/24    Referring Provider Peder Bourdon MD      Oxygen    Maintain Oxygen  Saturation 88% or higher      NuStep   Level 2    SPM 60    Minutes 15    METs 1.9      Track    Laps 15  Minutes 15    METs 1.9      Prescription Details   Frequency (times per week) 2    Duration Progress to 30 minutes of continuous aerobic without signs/symptoms of physical distress      Intensity   THRR 40-80% of Max Heartrate 81-119    Ratings of Perceived Exertion 11-13    Perceived Dyspnea 0-4      Resistance Training   Training Prescription Yes    Weight 3    Reps 10-15             Perform Capillary Blood Glucose checks as needed.  Exercise Prescription Changes:   Exercise Prescription Changes     Row Name 01/23/24 1400             Response to Exercise   Blood Pressure (Admit) 180/80       Blood Pressure (Exercise) 164/70       Blood Pressure (Exit) 148/70       Heart Rate (Admit) 43 bpm       Heart Rate (Exercise) 78 bpm       Heart Rate (Exit) 53 bpm       Oxygen  Saturation (Admit) 90 %       Oxygen  Saturation (Exercise) 80 %       Oxygen  Saturation (Exit) 91 %       Rating of Perceived Exertion (Exercise) 12       Perceived Dyspnea (Exercise) 2       Duration Continue with 30 min of aerobic exercise without signs/symptoms of physical distress.       Intensity THRR unchanged         Progression   Progression Continue to progress workloads to maintain intensity without signs/symptoms of physical distress.                Exercise Comments:   Exercise Comments     Row Name 01/27/24 [phone]           Exercise Comments First full day of exercise!  Patient was oriented to gym and equipment including functions, settings, policies, and procedures.  Patient's individual exercise prescription and treatment plan were reviewed.  All starting workloads were established based on the results of the 6 minute walk test done at initial orientation visit.  The plan for exercise progression was also introduced and progression will be customized based on patient's performance and goals.                Exercise Goals and Review:   Exercise Goals      Row Name 01/14/24 1333             Exercise Goals   Increase Physical Activity Yes       Intervention Provide advice, education, support and counseling about physical activity/exercise needs.;Develop an individualized exercise prescription for aerobic and resistive training based on initial evaluation findings, risk stratification, comorbidities and participant's personal goals.       Expected Outcomes Short Term: Attend rehab on a regular basis to increase amount of physical activity.;Long Term: Add in home exercise to make exercise part of routine and to increase amount of physical activity.;Long Term: Exercising regularly at least 3-5 days a week.       Increase Strength and Stamina Yes       Intervention Provide advice, education, support and counseling about physical activity/exercise needs.;Develop an individualized exercise prescription for aerobic and resistive training based on initial evaluation findings, risk stratification, comorbidities and  participant's personal goals.       Expected Outcomes Short Term: Increase workloads from initial exercise prescription for resistance, speed, and METs.;Short Term: Perform resistance training exercises routinely during rehab and add in resistance training at home;Long Term: Improve cardiorespiratory fitness, muscular endurance and strength as measured by increased METs and functional capacity ( )       Able to understand and use rate of perceived exertion (RPE) scale Yes       Intervention Provide education and explanation on how to use RPE scale       Expected Outcomes Short Term: Able to use RPE daily in rehab to express subjective intensity level;Long Term:  Able to use RPE to guide intensity level when exercising independently       Able to understand and use Dyspnea scale Yes       Intervention Provide education and explanation on how to use Dyspnea scale       Expected Outcomes Short Term: Able to use Dyspnea scale daily in rehab to  express subjective sense of shortness of breath during exertion;Long Term: Able to use Dyspnea scale to guide intensity level when exercising independently       Knowledge and understanding of Target Heart Rate Range (THRR) Yes       Intervention Provide education and explanation of THRR including how the numbers were predicted and where they are located for reference       Expected Outcomes Short Term: Able to use daily as guideline for intensity in rehab;Long Term: Able to use THRR to govern intensity when exercising independently;Short Term: Able to state/look up THRR       Able to check pulse independently Yes       Intervention Provide education and demonstration on how to check pulse in carotid and radial arteries.;Review the importance of being able to check your own pulse for safety during independent exercise       Expected Outcomes Short Term: Able to explain why pulse checking is important during independent exercise;Long Term: Able to check pulse independently and accurately       Understanding of Exercise Prescription Yes       Intervention Provide education, explanation, and written materials on patient's individual exercise prescription       Expected Outcomes Short Term: Able to explain program exercise prescription;Long Term: Able to explain home exercise prescription to exercise independently                Exercise Goals Re-Evaluation :  Exercise Goals Re-Evaluation     Row Name 01/27/24 [phone]             Exercise Goal Re-Evaluation   Exercise Goals Review Able to understand and use Dyspnea scale;Understanding of Exercise Prescription;Able to understand and use rate of perceived exertion (RPE) scale;Knowledge and understanding of Target Heart Rate Range (THRR)       Comments Reviewed RPE and dyspnea scale, THR and program prescription with pt today.  Pt voiced understanding and was given a copy of goals to take home.       Expected Outcomes Short: Use RPE daily to  regulate intensity.  Long: Follow program prescription in THR.                Discharge Exercise Prescription (Final Exercise Prescription Changes):  Exercise Prescription Changes - 01/23/24 1400       Response to Exercise   Blood Pressure (Admit) 180/80    Blood Pressure (Exercise) 164/70    Blood  Pressure (Exit) 148/70    Heart Rate (Admit) 43 bpm    Heart Rate (Exercise) 78 bpm    Heart Rate (Exit) 53 bpm    Oxygen  Saturation (Admit) 90 %    Oxygen  Saturation (Exercise) 80 %    Oxygen  Saturation (Exit) 91 %    Rating of Perceived Exertion (Exercise) 12    Perceived Dyspnea (Exercise) 2    Duration Continue with 30 min of aerobic exercise without signs/symptoms of physical distress.    Intensity THRR unchanged      Progression   Progression Continue to progress workloads to maintain intensity without signs/symptoms of physical distress.             Nutrition:  Target Goals: Understanding of nutrition guidelines, daily intake of sodium 1500mg , cholesterol 200mg , calories 30% from fat and 7% or less from saturated fats, daily to have 5 or more servings of fruits and vegetables.  Biometrics:  Pre Biometrics - 01/23/24 1450       Pre Biometrics   Height 5\' 4"  (1.626 m)    Weight 171 lb 15.3 oz (78 kg)    Waist Circumference 40 inches    Hip Circumference 41 inches    Waist to Hip Ratio 0.98 %    BMI (Calculated) 29.5    Grip Strength 10.4 kg              Nutrition Therapy Plan and Nutrition Goals:   Nutrition Assessments:  MEDIFICTS Score Key: >=70 Need to make dietary changes  40-70 Heart Healthy Diet <= 40 Therapeutic Level Cholesterol Diet  Flowsheet Row PULMONARY REHAB OTHER RESP ORIENTATION from 01/23/2024 in [facility] CARDIAC REHABILITATION  Picture Your Plate Total Score on Admission 40      Picture Your Plate Scores: <16 Unhealthy dietary pattern with much room for improvement. 41-50 Dietary pattern unlikely to meet recommendations  for good health and room for improvement. 51-60 More healthful dietary pattern, with some room for improvement.  >60 Healthy dietary pattern, although there may be some specific behaviors that could be improved.    Nutrition Goals Re-Evaluation:   Nutrition Goals Discharge (Final Nutrition Goals Re-Evaluation):   Psychosocial: Target Goals: Acknowledge presence or absence of significant depression and/or stress, maximize coping skills, provide positive support system. Participant is able to verbalize types and ability to use techniques and skills needed for reducing stress and depression.  Initial Review & Psychosocial Screening:  Initial Psych Review & Screening - 01/14/24 1305       Initial Review   Current issues with None Identified      Family Dynamics   Good Support System? Yes   Wife lives with him.     Barriers   Psychosocial barriers to participate in program There are no identifiable barriers or psychosocial needs.      Screening Interventions   Interventions Encouraged to exercise    Expected Outcomes Short Term goal: Utilizing psychosocial counselor, staff and physician to assist with identification of specific Stressors or current issues interfering with healing process. Setting desired goal for each stressor or current issue identified.;Long Term Goal: Stressors or current issues are controlled or eliminated.;Short Term goal: Identification and review with participant of any Quality of Life or Depression concerns found by scoring the questionnaire.;Long Term goal: The participant improves quality of Life and PHQ9 Scores as seen by post scores and/or verbalization of changes             Quality of Life Scores:  Scores of  19 and below usually indicate a poorer quality of life in these areas.  A difference of  2-3 points is a clinically meaningful difference.  A difference of 2-3 points in the total score of the Quality of Life Index has been associated with  significant improvement in overall quality of life, self-image, physical symptoms, and general health in studies assessing change in quality of life.   PHQ-9: Review Flowsheet       01/23/2024  Depression screen PHQ 2/9  Decreased Interest 0  Down, Depressed, Hopeless 0  PHQ - 2 Score 0  Altered sleeping 0  Tired, decreased energy 0  Change in appetite 0  Feeling bad or failure about yourself  0  Trouble concentrating 0  Moving slowly or fidgety/restless 0  Suicidal thoughts 0  PHQ-9 Score 0   Interpretation of Total Score  Total Score Depression Severity:  1-4 = Minimal depression, 5-9 = Mild depression, 10-14 = Moderate depression, 15-19 = Moderately severe depression, 20-27 = Severe depression   Psychosocial Evaluation and Intervention:  Psychosocial Evaluation - 01/14/24 1308       Psychosocial Evaluation & Interventions   Interventions Relaxation education;Encouraged to exercise with the program and follow exercise prescription    Comments Blanton is a pleasant gentleman who is coming to rehab for a diagnosis of pulmonary HTN. He was recently hospitalized on 1/25 with PNA where he got discharged with O2, but hasn't used it in the last little while as he states he doesn't need it. Axel uses a cane for ambulation, which he says he doesn't need it but because his wife wants him to use it for safety reasons. He lives at home with his wife and dog who are his support system. 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 He denies any current stressors in life. His current exercise consists of what the home PT tells him to do, which he says he has 2 more sessions of that left. Tavio is eager to come exercise to get his strenth back.    Expected Outcomes Short: Increase strength and stamina. Long: Be able to walk without a cane.    Continue Psychosocial Services  Follow up required by staff             Psychosocial  Re-Evaluation:   Psychosocial Discharge (Final Psychosocial Re-Evaluation):    Education: Education Goals: Education classes will be provided on a weekly basis, covering required topics. Participant will state understanding/return demonstration of topics presented.  Learning Barriers/Preferences:  Learning Barriers/Preferences - 01/14/24 1308       Learning Barriers/Preferences   Learning Barriers Sight   Wears glasses   Learning Preferences None             Education Topics: How Lungs Work and Diseases: - Discuss the anatomy of the lungs and diseases that can affect the lungs, such as COPD.   Exercise: -Discuss the importance of exercise, FITT principles of exercise, normal and abnormal responses to exercise, and how to exercise safely.   Environmental Irritants: -Discuss types of environmental irritants and how to limit exposure to environmental irritants.   Meds/Inhalers and oxygen : - Discuss respiratory medications, definition of an inhaler and oxygen , and the proper way to use an inhaler and oxygen .   Energy Saving Techniques: - Discuss methods to conserve energy and decrease shortness of breath when performing activities of daily living.    Bronchial Hygiene / Breathing Techniques: - Discuss breathing mechanics, pursed-lip  breathing technique,  proper posture, effective ways to clear airways, and other functional breathing techniques   Cleaning Equipment: - Provides group verbal and written instruction about the health risks of elevated stress, cause of high stress, and healthy ways to reduce stress.   Nutrition I: Fats: - Discuss the types of cholesterol, what cholesterol does to the body, and how cholesterol levels can be controlled.   Nutrition II: Labels: -Discuss the different components of food labels and how to read food labels.   Respiratory Infections: - Discuss the signs and symptoms of respiratory infections, ways to prevent respiratory  infections, and the importance of seeking medical treatment when having a respiratory infection.   Stress I: Signs and Symptoms: - Discuss the causes of stress, how stress may lead to anxiety and depression, and ways to limit stress.   Stress II: Relaxation: -Discuss relaxation techniques to limit stress.   Oxygen  for Home/Travel: - Discuss how to prepare for travel when on oxygen  and proper ways to transport and store oxygen  to ensure safety.   Knowledge Questionnaire Score:  Knowledge Questionnaire Score - 01/23/24 1520       Knowledge Questionnaire Score   Pre Score 12/18             Core Components/Risk Factors/Patient Goals at Admission:  Personal Goals and Risk Factors at Admission - 01/14/24 1358       Core Components/Risk Factors/Patient Goals on Admission    Weight Management Yes    Intervention Weight Management: Develop a combined nutrition and exercise program designed to reach desired caloric intake, while maintaining appropriate intake of nutrient and fiber, sodium and fats, and appropriate energy expenditure required for the weight goal.;Weight Management: Provide education and appropriate resources to help participant work on and attain dietary goals.;Weight Management/Obesity: Establish reasonable short term and long term weight goals.;Obesity: Provide education and appropriate resources to help participant work on and attain dietary goals.    Expected Outcomes Short Term: Continue to assess and modify interventions until short term weight is achieved;Long Term: Adherence to nutrition and physical activity/exercise program aimed toward attainment of established weight goal;Weight Maintenance: Understanding of the daily nutrition guidelines, which includes 25-35% calories from fat, 7% or less cal from saturated fats, less than 200mg  cholesterol, less than 1.5gm of sodium, & 5 or more servings of fruits and vegetables daily;Understanding recommendations for meals to  include 15-35% energy as protein, 25-35% energy from fat, 35-60% energy from carbohydrates, less than 200mg  of dietary cholesterol, 20-35 gm of total fiber daily    Improve shortness of breath with ADL's Yes    Intervention Provide education, individualized exercise plan and daily activity instruction to help decrease symptoms of SOB with activities of daily living.    Expected Outcomes Short Term: Improve cardiorespiratory fitness to achieve a reduction of symptoms when performing ADLs;Long Term: Be able to perform more ADLs without symptoms or delay the onset of symptoms    Increase knowledge of respiratory medications and ability to use respiratory devices properly  Yes    Intervention Provide education and demonstration as needed of appropriate use of medications, inhalers, and oxygen  therapy.    Expected Outcomes Short Term: Achieves understanding of medications use. Understands that oxygen  is a medication prescribed by physician. Demonstrates appropriate use of inhaler and oxygen  therapy.;Long Term: Maintain appropriate use of medications, inhalers, and oxygen  therapy.    Diabetes Yes    Intervention Provide education about proper nutrition, including hydration, and aerobic/resistive exercise prescription along with prescribed medications  to achieve blood glucose in normal ranges: Fasting glucose 65-99 mg/dL;Provide education about signs/symptoms and action to take for hypo/hyperglycemia.    Expected Outcomes Short Term: Participant verbalizes understanding of the signs/symptoms and immediate care of hyper/hypoglycemia, proper foot care and importance of medication, aerobic/resistive exercise and nutrition plan for blood glucose control.;Long Term: Attainment of HbA1C < 7%.    Hypertension Yes    Intervention Monitor prescription use compliance.;Provide education on lifestyle modifcations including regular physical activity/exercise, weight management, moderate sodium restriction and increased  consumption of fresh fruit, vegetables, and low fat dairy, alcohol  moderation, and smoking cessation.    Expected Outcomes Short Term: Continued assessment and intervention until BP is < 140/4mm HG in hypertensive participants. < 130/55mm HG in hypertensive participants with diabetes, heart failure or chronic kidney disease.;Long Term: Maintenance of blood pressure at goal levels.    Lipids Yes    Intervention Provide education and support for participant on nutrition & aerobic/resistive exercise along with prescribed medications to achieve LDL 70mg , HDL >40mg .    Expected Outcomes Short Term: Participant states understanding of desired cholesterol values and is compliant with medications prescribed. Participant is following exercise prescription and nutrition guidelines.;Long Term: Cholesterol controlled with medications as prescribed, with individualized exercise RX and with personalized nutrition plan. Value goals: LDL < 70mg , HDL > 40 mg.             Core Components/Risk Factors/Patient Goals Review:    Core Components/Risk Factors/Patient Goals at Discharge (Final Review):    ITP Comments:  ITP Comments     Row Name 01/14/24 1316 01/24/24 0812 01/27/24 0923 02/12/24 0845     ITP Comments Completed virtual orientation today.  EP evaluation is scheduled for 01/23/24 at 1:30pm .  Documentation for diagnosis can be found in [facility] encounter 12/18/23. Patient arrived for 1st visit/orientation/education at 1315. Patient was referred to PR by Peder Bourdon, MD due to Pulmonary HTN. During orientation advised patient on arrival and appointment times what to wear, what to do before, during and after exercise. Reviewed attendance and class policy.  Pt is scheduled to return Pulmonary Rehab on 01/27/24 at 915. Pt was advised to come to class 15 minutes before class starts.  Discussed RPE/Dpysnea scales. Patient participated in warm up stretches. Patient was able to complete 6 minute walk test.  Patient  was measured for the equipment. Discussed equipment safety with patient. Took patient pre-anthropometric measurements. Patient finished visit at 1415. First full day of exercise!  Patient was oriented to gym and equipment including functions, settings, policies, and procedures.  Patient's individual exercise prescription and treatment plan were reviewed.  All starting workloads were established based on the results of the 6 minute walk test done at initial orientation visit.  The plan for exercise progression was also introduced and progression will be customized based on patient's performance and goals. 30 day review completed. ITP sent to Dr.Jehanzeb Memon, Medical Director of  Pulmonary Rehab. Continue with ITP unless changes are made by physician.  Newer to program             Comments: 30 day review

## 2024-02-17 ENCOUNTER — Encounter (HOSPITAL_COMMUNITY)
Admission: RE | Admit: 2024-02-17 | Discharge: 2024-02-17 | Disposition: A | Discharge status: Home / Self Care | Source: Ambulatory Visit | Attending: Cardiology | Admitting: Cardiology

## 2024-02-17 DIAGNOSIS — I272 Pulmonary hypertension, unspecified: Secondary | ICD-10-CM | POA: Diagnosis not present

## 2024-02-17 NOTE — Progress Notes (Signed)
 Daily Session Note  Patient Details  Name: Robert Baldwin MRN: [id number] Date of Birth: Mar 08, 1941 Referring Provider:   Flowsheet Row PULMONARY REHAB OTHER RESP ORIENTATION from 01/23/2024 in [facility] CARDIAC REHABILITATION  Referring Provider Peder Bourdon MD       Encounter Date: 02/17/2024  Check In:  Session Check In - 02/17/24 0910       Check-In   Supervising physician immediately available to respond to emergencies See telemetry face sheet for immediately available MD    Location [facility]    Staff Present Jerrol Morelle, BSN, RN, Octavia Belton, BS, RRT, CPFT;Heather Alec Huntington, Exercise Physiologist    Virtual Visit No    Medication changes reported     No    Fall or balance concerns reported    No    Tobacco Cessation No Change    Warm-up and Cool-down Performed on first and last piece of equipment    Resistance Training Performed Yes    VAD Patient? No    PAD/SET Patient? No      Pain Assessment   Currently in Pain? No/denies             Capillary Blood Glucose: No results found for this or any previous visit (from the past 24 hours).    Social History   Tobacco Use  Smoking Status Never   Passive exposure: Never  Smokeless Tobacco Never  Tobacco Comments   Never smoke 05/18/22    Goals Met:  Proper associated with RPD/PD & O2 Sat Independence with exercise equipment Improved SOB with ADL's Using PLB without cueing & demonstrates good technique Exercise tolerated well No report of concerns or symptoms today Strength training completed today  Goals Unmet:  Not Applicable  Comments: Pt able to follow exercise prescription today without complaint.  Will continue to monitor for progression.

## 2024-02-19 ENCOUNTER — Encounter (HOSPITAL_COMMUNITY)
Admission: RE | Admit: 2024-02-19 | Discharge: 2024-02-19 | Disposition: A | Discharge status: Home / Self Care | Source: Ambulatory Visit | Attending: Cardiology | Admitting: Cardiology

## 2024-02-19 DIAGNOSIS — I272 Pulmonary hypertension, unspecified: Secondary | ICD-10-CM

## 2024-02-19 NOTE — Progress Notes (Signed)
 Daily Session Note  Patient Details  Name: KEYTON WINIARSKI MRN: [id number] Date of Birth: 1941/08/13 Referring Provider:   Flowsheet Row PULMONARY REHAB OTHER RESP ORIENTATION from 01/23/2024 in [facility] CARDIAC REHABILITATION  Referring Provider Peder Bourdon MD       Encounter Date: 02/19/2024  Check In:  Session Check In - 02/19/24 1004       Check-In   Supervising physician immediately available to respond to emergencies See telemetry face sheet for immediately available MD    Location [facility]    Staff Present Almeda Aris, RN;Emari Demmer [place], MA, RCEP, CCRP, CCET    Virtual Visit No    Medication changes reported     No    Fall or balance concerns reported    No    Warm-up and Cool-down Performed on first and last piece of equipment    Resistance Training Performed Yes    VAD Patient? No    PAD/SET Patient? No      Pain Assessment   Currently in Pain? No/denies             Capillary Blood Glucose: No results found for this or any previous visit (from the past 24 hours).    Social History   Tobacco Use  Smoking Status Never   Passive exposure: Never  Smokeless Tobacco Never  Tobacco Comments   Never smoke 05/18/22    Goals Met:  Proper associated with RPD/PD & O2 Sat Using PLB without cueing & demonstrates good technique Exercise tolerated well No report of concerns or symptoms today Strength training completed today  Goals Unmet:  Not Applicable  Comments: Pt able to follow exercise prescription today without complaint.  Will continue to monitor for progression.

## 2024-02-24 ENCOUNTER — Encounter (HOSPITAL_COMMUNITY)
Admission: RE | Admit: 2024-02-24 | Discharge: 2024-02-24 | Disposition: A | Discharge status: Home / Self Care | Source: Ambulatory Visit | Attending: Cardiology | Admitting: Cardiology

## 2024-02-24 DIAGNOSIS — I272 Pulmonary hypertension, unspecified: Secondary | ICD-10-CM | POA: Diagnosis not present

## 2024-02-24 NOTE — Progress Notes (Signed)
 Daily Session Note  Patient Details  Name: Robert Baldwin MRN: [id number] Date of Birth: [birth year] Referring Provider:   Flowsheet Row PULMONARY REHAB OTHER RESP ORIENTATION from 01/23/2024 in [facility] CARDIAC REHABILITATION  Referring Provider Peder Bourdon MD       Encounter Date: 02/24/2024  Check In:  Session Check In - 02/24/24 0915       Check-In   Supervising physician immediately available to respond to emergencies See telemetry face sheet for immediately available MD    Location [facility]    Staff Present Clotilda Danish, BS, Exercise Physiologist;Brittany Annette Barters, BSN, RN, WTA-C    Virtual Visit No    Medication changes reported     No    Fall or balance concerns reported    No    Tobacco Cessation No Change    Warm-up and Cool-down Performed on first and last piece of equipment    Resistance Training Performed Yes    VAD Patient? No    PAD/SET Patient? No      Pain Assessment   Currently in Pain? No/denies    Multiple Pain Sites No             Capillary Blood Glucose: No results found for this or any previous visit (from the past 24 hours).    Social History   Tobacco Use  Smoking Status Never   Passive exposure: Never  Smokeless Tobacco Never  Tobacco Comments   Never smoke 05/18/22    Goals Met:  Independence with exercise equipment Exercise tolerated well No report of concerns or symptoms today Strength training completed today  Goals Unmet:  Not Applicable  Comments: Pt able to follow exercise prescription today without complaint.  Will continue to monitor for progression.

## 2024-02-26 ENCOUNTER — Encounter (HOSPITAL_COMMUNITY): Admission: RE | Admit: 2024-02-26 | Source: Ambulatory Visit

## 2024-02-26 DIAGNOSIS — I50813 Acute on chronic right heart failure: Secondary | ICD-10-CM | POA: Diagnosis not present

## 2024-02-28 ENCOUNTER — Encounter (HOSPITAL_COMMUNITY)
Admission: RE | Admit: 2024-02-28 | Discharge: 2024-02-28 | Disposition: A | Discharge status: Home / Self Care | Source: Ambulatory Visit | Attending: Cardiology | Admitting: Cardiology

## 2024-02-28 DIAGNOSIS — I272 Pulmonary hypertension, unspecified: Secondary | ICD-10-CM

## 2024-02-28 NOTE — Progress Notes (Signed)
 Daily Session Note  Patient Details  Name: Robert Baldwin MRN: [id number] Date of Birth: Feb 27, 1941 Referring Provider:   Flowsheet Row PULMONARY REHAB OTHER RESP ORIENTATION from 01/23/2024 in [facility] CARDIAC REHABILITATION  Referring Provider Peder Bourdon MD       Encounter Date: 02/28/2024  Check In:  Session Check In - 02/28/24 0915       Check-In   Supervising physician immediately available to respond to emergencies See telemetry face sheet for immediately available MD    Location [facility]    Staff Present Jerrol Morelle, BSN, RN, WTA-C;Heather Toy Freund, BS, Exercise Physiologist    Virtual Visit No    Medication changes reported     No    Fall or balance concerns reported    No    Tobacco Cessation No Change    Warm-up and Cool-down Performed on first and last piece of equipment    Resistance Training Performed Yes    VAD Patient? No    PAD/SET Patient? No      Pain Assessment   Currently in Pain? No/denies             Capillary Blood Glucose: No results found for this or any previous visit (from the past 24 hours).    Social History   Tobacco Use  Smoking Status Never   Passive exposure: Never  Smokeless Tobacco Never  Tobacco Comments   Never smoke 05/18/22    Goals Met:  Proper associated with RPD/PD & O2 Sat Independence with exercise equipment Improved SOB with ADL's Using PLB without cueing & demonstrates good technique Exercise tolerated well No report of concerns or symptoms today Strength training completed today  Goals Unmet:  Not Applicable  Comments: Pt able to follow exercise prescription today without complaint.  Will continue to monitor for progression.

## 2024-03-02 ENCOUNTER — Other Ambulatory Visit: Payer: Self-pay | Admitting: Cardiology

## 2024-03-02 ENCOUNTER — Encounter (HOSPITAL_COMMUNITY)
Admission: RE | Admit: 2024-03-02 | Discharge: 2024-03-02 | Disposition: A | Discharge status: Home / Self Care | Source: Ambulatory Visit | Attending: Cardiology | Admitting: Cardiology

## 2024-03-02 DIAGNOSIS — I272 Pulmonary hypertension, unspecified: Secondary | ICD-10-CM

## 2024-03-02 NOTE — Progress Notes (Signed)
 Daily Session Note  Patient Details  Name: Robert Baldwin MRN: [id number] Date of Birth: 05/16/1941 Referring Provider:   Flowsheet Row PULMONARY REHAB OTHER RESP ORIENTATION from 01/23/2024 in [facility] CARDIAC REHABILITATION  Referring Provider Peder Bourdon MD       Encounter Date: 03/02/2024  Check In:  Session Check In - 03/02/24 0915       Check-In   Supervising physician immediately available to respond to emergencies See telemetry face sheet for immediately available MD    Location [facility]    Staff Present Rita Cherry, MA, RCEP, CCRP, CCET;Jobani Sabado Annette Barters, BSN, RN, WTA-C    Virtual Visit No    Medication changes reported     No    Fall or balance concerns reported    No    Tobacco Cessation No Change    Warm-up and Cool-down Performed on first and last piece of equipment    Resistance Training Performed Yes    VAD Patient? No    PAD/SET Patient? No      Pain Assessment   Currently in Pain? No/denies             Capillary Blood Glucose: No results found for this or any previous visit (from the past 24 hours).    Social History   Tobacco Use  Smoking Status Never   Passive exposure: Never  Smokeless Tobacco Never  Tobacco Comments   Never smoke 05/18/22    Goals Met:  Proper associated with RPD/PD & O2 Sat Independence with exercise equipment Improved SOB with ADL's Using PLB without cueing & demonstrates good technique Exercise tolerated well No report of concerns or symptoms today Strength training completed today  Goals Unmet:  Not Applicable  Comments: Pt able to follow exercise prescription today without complaint.  Will continue to monitor for progression.

## 2024-03-04 ENCOUNTER — Encounter (HOSPITAL_COMMUNITY)
Admission: RE | Admit: 2024-03-04 | Discharge: 2024-03-04 | Disposition: A | Discharge status: Home / Self Care | Source: Ambulatory Visit | Attending: Cardiology

## 2024-03-04 DIAGNOSIS — I272 Pulmonary hypertension, unspecified: Secondary | ICD-10-CM

## 2024-03-04 NOTE — Progress Notes (Signed)
 Daily Session Note  Patient Details  Name: Robert Baldwin MRN: [id number] Date of Birth: [birth year] Referring Provider:   Flowsheet Row PULMONARY REHAB OTHER RESP ORIENTATION from 01/23/2024 in [facility] CARDIAC REHABILITATION  Referring Provider Peder Bourdon MD       Encounter Date: 03/04/2024  Check In:  Session Check In - 03/04/24 0914       Check-In   Supervising physician immediately available to respond to emergencies See telemetry face sheet for immediately available MD    Location [facility]    Staff Present Jerrol Morelle, BSN, RN, WTA-C;Hillary Troutman BSN, RN;Heather Toy Freund, BS, Exercise Physiologist    Virtual Visit No    Medication changes reported     No    Fall or balance concerns reported    No    Tobacco Cessation No Change    Warm-up and Cool-down Performed on first and last piece of equipment    Resistance Training Performed Yes    VAD Patient? No    PAD/SET Patient? No      Pain Assessment   Currently in Pain? No/denies             Capillary Blood Glucose: No results found for this or any previous visit (from the past 24 hours).    Social History   Tobacco Use  Smoking Status Never   Passive exposure: Never  Smokeless Tobacco Never  Tobacco Comments   Never smoke 05/18/22    Goals Met:  Proper associated with RPD/PD & O2 Sat Independence with exercise equipment Improved SOB with ADL's Using PLB without cueing & demonstrates good technique Exercise tolerated well No report of concerns or symptoms today Strength training completed today  Goals Unmet:  Not Applicable  Comments: Pt able to follow exercise prescription today without complaint.  Will continue to monitor for progression.

## 2024-03-06 ENCOUNTER — Encounter (HOSPITAL_COMMUNITY)
Admission: RE | Admit: 2024-03-06 | Discharge: 2024-03-06 | Disposition: A | Discharge status: Home / Self Care | Source: Ambulatory Visit | Attending: Cardiology | Admitting: Cardiology

## 2024-03-06 DIAGNOSIS — I272 Pulmonary hypertension, unspecified: Secondary | ICD-10-CM | POA: Diagnosis not present

## 2024-03-06 NOTE — Progress Notes (Signed)
 Daily Session Note  Patient Details  Name: Robert Baldwin MRN: [id number] Date of Birth: 12-27-1940 Referring Provider:   Flowsheet Row PULMONARY REHAB OTHER RESP ORIENTATION from 01/23/2024 in [facility] CARDIAC REHABILITATION  Referring Provider Peder Bourdon MD       Encounter Date: 03/06/2024  Check In:  Session Check In - 03/06/24 0911       Check-In   Supervising physician immediately available to respond to emergencies See telemetry face sheet for immediately available MD    Location [facility]    Staff Present Rita Cherry, MA, RCEP, CCRP, CCET;Shala Baumbach Toy Freund, [place], Exercise Physiologist    Virtual Visit No    Medication changes reported     No    Fall or balance concerns reported    No    Tobacco Cessation No Change    Warm-up and Cool-down Performed on first and last piece of equipment    Resistance Training Performed Yes    VAD Patient? No    PAD/SET Patient? No      Pain Assessment   Currently in Pain? No/denies    Multiple Pain Sites No             Capillary Blood Glucose: No results found for this or any previous visit (from the past 24 hours).    Social History   Tobacco Use  Smoking Status Never   Passive exposure: Never  Smokeless Tobacco Never  Tobacco Comments   Never smoke 05/18/22    Goals Met:  Independence with exercise equipment Exercise tolerated well No report of concerns or symptoms today Strength training completed today  Goals Unmet:  Not Applicable  Comments: Pt able to follow exercise prescription today without complaint.  Will continue to monitor for progression.

## 2024-03-09 ENCOUNTER — Encounter (HOSPITAL_COMMUNITY)
Admission: RE | Admit: 2024-03-09 | Discharge: 2024-03-09 | Disposition: A | Discharge status: Home / Self Care | Source: Ambulatory Visit | Attending: Cardiology | Admitting: Cardiology

## 2024-03-09 DIAGNOSIS — I272 Pulmonary hypertension, unspecified: Secondary | ICD-10-CM

## 2024-03-09 NOTE — Progress Notes (Signed)
 Daily Session Note  Patient Details  Name: Robert Baldwin MRN: [id number] Date of Birth: 11/26/1940 Referring Provider:   Flowsheet Row PULMONARY REHAB OTHER RESP ORIENTATION from 01/23/2024 in [facility] CARDIAC REHABILITATION  Referring Provider Peder Bourdon MD       Encounter Date: 03/09/2024  Check In:  Session Check In - 03/09/24 0930       Check-In   Supervising physician immediately available to respond to emergencies See telemetry face sheet for immediately available MD    Location [facility]    Staff Present Clotilda Danish, BS, Exercise Physiologist;Brittany Annette Barters, BSN, RN, WTA-C;Clarie Camey, RN;Jessica [place], MA, RCEP, CCRP, Amador Bad, RN, BSN    Virtual Visit No    Medication changes reported     No    Fall or balance concerns reported    No    Warm-up and Cool-down Performed on first and last piece of equipment    Resistance Training Performed Yes    VAD Patient? No    PAD/SET Patient? No      Pain Assessment   Currently in Pain? No/denies    Multiple Pain Sites No             Capillary Blood Glucose: No results found for this or any previous visit (from the past 24 hours).    Social History   Tobacco Use  Smoking Status Never   Passive exposure: Never  Smokeless Tobacco Never  Tobacco Comments   Never smoke 05/18/22    Goals Met:  Proper associated with RPD/PD & O2 Sat Independence with exercise equipment Using PLB without cueing & demonstrates good technique Exercise tolerated well No report of concerns or symptoms today Strength training completed today  Goals Unmet:  Not Applicable  Comments: Pt able to follow exercise prescription today without complaint.  Will continue to monitor for progression.

## 2024-03-11 ENCOUNTER — Encounter (HOSPITAL_COMMUNITY)
Admission: RE | Admit: 2024-03-11 | Discharge: 2024-03-11 | Disposition: A | Discharge status: Home / Self Care | Source: Ambulatory Visit | Attending: Cardiology | Admitting: Cardiology

## 2024-03-11 ENCOUNTER — Encounter (HOSPITAL_COMMUNITY): Payer: Self-pay | Admitting: *Deleted

## 2024-03-11 DIAGNOSIS — I272 Pulmonary hypertension, unspecified: Secondary | ICD-10-CM | POA: Diagnosis not present

## 2024-03-11 NOTE — Progress Notes (Signed)
 Baldwin Session Note  Patient Details  Name: Robert Baldwin MRN: [id number] Date of Birth: 1941-07-21 Referring Provider:   Flowsheet Row PULMONARY REHAB OTHER RESP ORIENTATION from 01/23/2024 in [facility] CARDIAC REHABILITATION  Referring Provider Peder Bourdon MD       Encounter Date: 03/11/2024  Check In:  Session Check In - 03/11/24 1001       Check-In   Supervising physician immediately available to respond to emergencies See telemetry face sheet for immediately available MD    Location [facility]    Staff Present Clotilda Danish, BS, Exercise Physiologist;Brooke Rollin Clock, RN    Virtual Visit No    Medication changes reported     No    Fall or balance concerns reported    No    Warm-up and Cool-down Performed on first and last piece of equipment    Resistance Training Performed Yes    VAD Patient? No    PAD/SET Patient? No      Pain Assessment   Currently in Pain? No/denies             Capillary Blood Glucose: No results found for this or any previous visit (from the past 24 hours).    Social History   Tobacco Use  Smoking Status Never   Passive exposure: Never  Smokeless Tobacco Never  Tobacco Comments   Never smoke 05/18/22    Goals Met:  Proper associated with RPD/PD & O2 Sat Independence with exercise equipment Using PLB without cueing & demonstrates good technique Exercise tolerated well No report of concerns or symptoms today Strength training completed today  Goals Unmet:  Not Applicable  Comments: Pt able to follow exercise prescription today without complaint.  Will continue to monitor for progression.

## 2024-03-11 NOTE — Progress Notes (Signed)
 Pulmonary Individual Treatment Plan  Patient Details  Name: Robert Baldwin MRN: [id number] Date of Birth: July 01, 1941 Referring Provider:   Flowsheet Row PULMONARY REHAB OTHER RESP ORIENTATION from 01/23/2024 in [facility] CARDIAC REHABILITATION  Referring Provider Peder Bourdon MD       Initial Encounter Date:  Flowsheet Row PULMONARY REHAB OTHER RESP ORIENTATION from 01/23/2024 in [facility] [place] CARDIAC REHABILITATION  Date 01/23/24       Visit Diagnosis: Pulmonary hypertension (HCC)  Patient's Home Medications on Admission:   Current Outpatient Medications:    amLODipine  (NORVASC ) 10 MG tablet, Take 10 mg by mouth daily. (Patient not taking: Reported on 01/23/2024), Disp: , Rfl:    atorvastatin  (LIPITOR) 40 MG tablet, Take 40 mg by mouth at bedtime., Disp: , Rfl:    cholecalciferol  (VITAMIN D ) 25 MCG (1000 UNIT) tablet, Take 1,000 Units by mouth daily., Disp: , Rfl:    FARXIGA  10 MG TABS tablet, TAKE 1 TABLET BY MOUTH DAILY, Disp: 30 tablet, Rfl: 4   fish oil-omega-3 fatty acids  1000 MG capsule, Take 1 g by mouth every evening., Disp: , Rfl:    hydrALAZINE  (APRESOLINE ) 25 MG tablet, TAKE 1 TABLET BY MOUTH THREE TIMES DAILY, Disp: 90 tablet, Rfl: 1   macitentan  (OPSUMIT ) 10 MG tablet, TAKE 1 TABLET BY MOUTH DAILY, Disp: 90 tablet, Rfl: 3   Misc Natural Products (GLUCOS-CHONDROIT-MSM COMPLEX) TABS, Take 2 tablets by mouth daily., Disp: , Rfl:    Multiple Vitamins-Minerals (MULTIVITAMIN WITH MINERALS) tablet, Take 1 tablet by mouth in the morning.  Centrum Silver, Disp: , Rfl:    niacin  500 MG tablet, Take 500 mg by mouth in the morning., Disp: , Rfl:    olmesartan (BENICAR) 5 MG tablet, Take 10 mg by mouth daily., Disp: , Rfl:    potassium chloride  (KLOR-CON  M) 10 MEQ tablet, Take 10 mEq by mouth daily., Disp: , Rfl:    tadalafil  (CIALIS ) 20 MG tablet, TAKE 2 TABLETS BY MOUTH DAILY, Disp: 180 tablet, Rfl: 3   torsemide  (DEMADEX ) 20 MG tablet, TAKE 4 TABLETS BY MOUTH EVERY MORNING AND 2  TABLETS EVERY EVENING, Disp: 220 tablet, Rfl: 5   warfarin (COUMADIN ) 5 MG tablet, Take 5-7.5 mg by mouth See admin instructions. Take 5 mg on Saturday,Sunday Tues, Wed, Thursday, and then  Take 7.5 mg on Monday,  Wednesday, and Friday, Disp: , Rfl:   Past Medical History: Past Medical History:  Diagnosis Date   (HFpEF) heart failure with preserved ejection fraction (HCC)    Arthritis    Atrial fibrillation (HCC)    Bradycardia    Carotid artery disease (HCC)    Hyperlipidemia    Hypertension    Pulmonary hypertension (HCC)    Type 2 diabetes mellitus (HCC)     Tobacco Use: Social History   Tobacco Use  Smoking Status Never   Passive exposure: Never  Smokeless Tobacco Never  Tobacco Comments   Never smoke 05/18/22    Labs: Review Flowsheet  More data exists      Latest Ref Rng & Units 05/14/2023 05/21/2023 11/13/2023 11/14/2023 12/18/2023  Labs for ITP Cardiac and Pulmonary Rehab  Cholestrol 0 - 200 mg/dL 98  - - - 469   LDL (calc) 0 - 99 mg/dL 47  - - - 50   HDL-C >62 mg/dL 42  - - - 59   Trlycerides <150 mg/dL 44  - - - 45   Hemoglobin A1c 4.8 - 5.6 % - - - 7.4  -  PH, Arterial  7.35 - 7.45 - 7.402  7.415  - - -  PCO2 arterial 32 - 48 mmHg - 41.4  40.9  - - -  Bicarbonate 20.0 - 28.0 mmol/L - 25.7  26.2  31.9  - -  TCO2 22 - 32 mmol/L - 27  27  29   - -  O2 Saturation % - 73  75  86.4  - -    Details       Multiple values from one day are sorted in reverse-chronological order         Capillary Blood Glucose: Lab Results  Component Value Date   GLUCAP 124 (H) 02/05/2024   GLUCAP 156 (H) 02/05/2024   GLUCAP 155 (H) 02/03/2024   GLUCAP 130 (H) 01/29/2024   GLUCAP 138 (H) 01/29/2024     Pulmonary Assessment Scores:  Pulmonary Assessment Scores     Row Name 01/23/24 1520         ADL UCSD   ADL Phase Entry     SOB Score total 23     Rest 0     Walk 3     Stairs 3     Bath 2     Dress 2     Shop 0       CAT Score   CAT Score 10       mMRC Score    mMRC Score 2             UCSD: Self-administered rating of dyspnea associated with activities of daily living (ADLs) 6-point scale (0 = "not at all" to 5 = "maximal or unable to do because of breathlessness")  Scoring Scores range from 0 to 120.  Minimally important difference is 5 units  CAT: CAT can identify the health impairment of COPD patients and is better correlated with disease progression.  CAT has a scoring range of zero to 40. The CAT score is classified into four groups of low (less than 10), medium (10 - 20), high (21-30) and very high (31-40) based on the impact level of disease on health status. A CAT score over 10 suggests significant symptoms.  A worsening CAT score could be explained by an exacerbation, poor medication adherence, poor inhaler technique, or progression of COPD or comorbid conditions.  CAT MCID is 2 points  mMRC: mMRC (Modified Medical Research Council) Dyspnea Scale is used to assess the degree of baseline functional disability in patients of respiratory disease due to dyspnea. No minimal important difference is established. A decrease in score of 1 point or greater is considered a positive change.   Pulmonary Function Assessment:   Exercise Target Goals: Exercise Program Goal: Individual exercise prescription set using results from initial 6 min walk test and THRR while considering  patient's activity barriers and safety.   Exercise Prescription Goal: Initial exercise prescription builds to 30-45 minutes a day of aerobic activity, 2-3 days per week.  Home exercise guidelines will be given to patient during program as part of exercise prescription that the participant will acknowledge.  Activity Barriers & Risk Stratification:  Activity Barriers & Cardiac Risk Stratification - 01/14/24 1318       Activity Barriers & Cardiac Risk Stratification   Activity Barriers Deconditioning;Assistive Device             6 Minute Walk:  6 Minute Walk      Row Name 01/23/24 1445         6 Minute Walk   Phase Initial  Distance 860 feet     Walk Time 6 minutes     # of Rest Breaks 0     MPH 1.63     METS 1.29     RPE 12     Perceived Dyspnea  2     VO2 Peak 4.52     Symptoms No     Resting HR 73 bpm     Resting BP 180/80     Resting Oxygen  Saturation  90 %     Exercise Oxygen  Saturation  during 6 min walk 85 %     Max Ex. HR 78 bpm     Max Ex. BP 164/70     2 Minute Post BP 148/70       Interval HR   1 Minute HR 66     2 Minute HR 68     3 Minute HR 70     4 Minute HR 71     5 Minute HR 76     6 Minute HR 78     2 Minute Post HR 53     Interval Heart Rate? Yes       Interval Oxygen    Interval Oxygen ? Yes     Baseline Oxygen  Saturation % 90 %     1 Minute Oxygen  Saturation % 86 %     1 Minute Liters of Oxygen  0 L     2 Minute Oxygen  Saturation % 86 %     2 Minute Liters of Oxygen  0 L     3 Minute Oxygen  Saturation % 86 %     3 Minute Liters of Oxygen  0 L     4 Minute Oxygen  Saturation % 86 %     4 Minute Liters of Oxygen  0 L     5 Minute Oxygen  Saturation % 85 %     5 Minute Liters of Oxygen  0 L     6 Minute Oxygen  Saturation % 86 %     6 Minute Liters of Oxygen  0 L     2 Minute Post Oxygen  Saturation % 91 %     2 Minute Post Liters of Oxygen  0 L              Oxygen  Initial Assessment:  Oxygen  Initial Assessment - 01/14/24 1312       Home Oxygen    Home Oxygen  Device None    Sleep Oxygen  Prescription None    Home Exercise Oxygen  Prescription None    Home Resting Oxygen  Prescription None    Compliance with Home Oxygen  Use Yes      Intervention   Short Term Goals To learn and exhibit compliance with exercise, home and travel O2 prescription;To learn and understand importance of monitoring SPO2 with pulse oximeter and demonstrate accurate use of the pulse oximeter.;To learn and understand importance of maintaining oxygen  saturations>88%;To learn and demonstrate proper pursed lip breathing  techniques or other breathing techniques. ;To learn and demonstrate proper use of respiratory medications    Long  Term Goals Exhibits compliance with exercise, home  and travel O2 prescription;Maintenance of O2 saturations>88%;Compliance with respiratory medication;Demonstrates proper use of MDI's;Exhibits proper breathing techniques, such as pursed lip breathing or other method taught during program session;Verbalizes importance of monitoring SPO2 with pulse oximeter and return demonstration             Oxygen  Re-Evaluation:  Oxygen  Re-Evaluation     Row Name 01/27/24 [phone] 02/28/24 0934  Program Oxygen  Prescription   Program Oxygen  Prescription None None        Home Oxygen    Home Oxygen  Device None None      Sleep Oxygen  Prescription None None      Home Exercise Oxygen  Prescription None None      Home Resting Oxygen  Prescription None None      Compliance with Home Oxygen  Use Yes Yes        Goals/Expected Outcomes   Short Term Goals To learn and exhibit compliance with exercise, home and travel O2 prescription;To learn and understand importance of monitoring SPO2 with pulse oximeter and demonstrate accurate use of the pulse oximeter.;To learn and understand importance of maintaining oxygen  saturations>88%;To learn and demonstrate proper pursed lip breathing techniques or other breathing techniques. ;To learn and demonstrate proper use of respiratory medications To learn and exhibit compliance with exercise, home and travel O2 prescription;To learn and understand importance of monitoring SPO2 with pulse oximeter and demonstrate accurate use of the pulse oximeter.;To learn and understand importance of maintaining oxygen  saturations>88%;To learn and demonstrate proper pursed lip breathing techniques or other breathing techniques. ;To learn and demonstrate proper use of respiratory medications      Long  Term Goals Exhibits compliance with exercise, home  and travel O2  prescription;Maintenance of O2 saturations>88%;Compliance with respiratory medication;Demonstrates proper use of MDI's;Exhibits proper breathing techniques, such as pursed lip breathing or other method taught during program session;Verbalizes importance of monitoring SPO2 with pulse oximeter and return demonstration Exhibits compliance with exercise, home  and travel O2 prescription;Maintenance of O2 saturations>88%;Compliance with respiratory medication;Demonstrates proper use of MDI's;Exhibits proper breathing techniques, such as pursed lip breathing or other method taught during program session;Verbalizes importance of monitoring SPO2 with pulse oximeter and return demonstration      Comments Reviewed PLB technique with pt.  Talked about how it works and it's importance in maintaining their exercise saturations. Reviewed PLB technique with pt.  Talked about how it works and it's importance in maintaining their exercise saturations.      Goals/Expected Outcomes Short: Become more profiecient at using PLB.   Long: Become independent at using PLB. Short: Become more profiecient at using PLB.   Long: Become independent at using PLB.               Oxygen  Discharge (Final Oxygen  Re-Evaluation):  Oxygen  Re-Evaluation - 02/28/24 0934       Program Oxygen  Prescription   Program Oxygen  Prescription None      Home Oxygen    Home Oxygen  Device None    Sleep Oxygen  Prescription None    Home Exercise Oxygen  Prescription None    Home Resting Oxygen  Prescription None    Compliance with Home Oxygen  Use Yes      Goals/Expected Outcomes   Short Term Goals To learn and exhibit compliance with exercise, home and travel O2 prescription;To learn and understand importance of monitoring SPO2 with pulse oximeter and demonstrate accurate use of the pulse oximeter.;To learn and understand importance of maintaining oxygen  saturations>88%;To learn and demonstrate proper pursed lip breathing techniques or other  breathing techniques. ;To learn and demonstrate proper use of respiratory medications    Long  Term Goals Exhibits compliance with exercise, home  and travel O2 prescription;Maintenance of O2 saturations>88%;Compliance with respiratory medication;Demonstrates proper use of MDI's;Exhibits proper breathing techniques, such as pursed lip breathing or other method taught during program session;Verbalizes importance of monitoring SPO2 with pulse oximeter and return demonstration    Comments Reviewed PLB technique with  pt.  Talked about how it works and it's importance in maintaining their exercise saturations.    Goals/Expected Outcomes Short: Become more profiecient at using PLB.   Long: Become independent at using PLB.             Initial Exercise Prescription:  Initial Exercise Prescription - 01/23/24 1400       Date of Initial Exercise RX and Referring Provider   Date 01/23/24    Referring Provider Peder Bourdon MD      Oxygen    Maintain Oxygen  Saturation 88% or higher      NuStep   Level 2    SPM 60    Minutes 15    METs 1.9      Track   Laps 15    Minutes 15    METs 1.9      Prescription Details   Frequency (times per week) 2    Duration Progress to 30 minutes of continuous aerobic without signs/symptoms of physical distress      Intensity   THRR 40-80% of Max Heartrate 81-119    Ratings of Perceived Exertion 11-13    Perceived Dyspnea 0-4      Resistance Training   Training Prescription Yes    Weight 3    Reps 10-15             Perform Capillary Blood Glucose checks as needed.  Exercise Prescription Changes:   Exercise Prescription Changes     Row Name 01/23/24 1400             Response to Exercise   Blood Pressure (Admit) 180/80       Blood Pressure (Exercise) 164/70       Blood Pressure (Exit) 148/70       Heart Rate (Admit) 43 bpm       Heart Rate (Exercise) 78 bpm       Heart Rate (Exit) 53 bpm       Oxygen  Saturation (Admit) 90 %        Oxygen  Saturation (Exercise) 80 %       Oxygen  Saturation (Exit) 91 %       Rating of Perceived Exertion (Exercise) 12       Perceived Dyspnea (Exercise) 2       Duration Continue with 30 min of aerobic exercise without signs/symptoms of physical distress.       Intensity THRR unchanged         Progression   Progression Continue to progress workloads to maintain intensity without signs/symptoms of physical distress.                Exercise Comments:   Exercise Comments     Row Name 01/27/24 [phone]           Exercise Comments First full day of exercise!  Patient was oriented to gym and equipment including functions, settings, policies, and procedures.  Patient's individual exercise prescription and treatment plan were reviewed.  All starting workloads were established based on the results of the 6 minute walk test done at initial orientation visit.  The plan for exercise progression was also introduced and progression will be customized based on patient's performance and goals.                Exercise Goals and Review:   Exercise Goals     Row Name 01/14/24 1333             Exercise Goals   Increase Physical  Activity Yes       Intervention Provide advice, education, support and counseling about physical activity/exercise needs.;Develop an individualized exercise prescription for aerobic and resistive training based on initial evaluation findings, risk stratification, comorbidities and participant's personal goals.       Expected Outcomes Short Term: Attend rehab on a regular basis to increase amount of physical activity.;Long Term: Add in home exercise to make exercise part of routine and to increase amount of physical activity.;Long Term: Exercising regularly at least 3-5 days a week.       Increase Strength and Stamina Yes       Intervention Provide advice, education, support and counseling about physical activity/exercise needs.;Develop an individualized exercise  prescription for aerobic and resistive training based on initial evaluation findings, risk stratification, comorbidities and participant's personal goals.       Expected Outcomes Short Term: Increase workloads from initial exercise prescription for resistance, speed, and METs.;Short Term: Perform resistance training exercises routinely during rehab and add in resistance training at home;Long Term: Improve cardiorespiratory fitness, muscular endurance and strength as measured by increased METs and functional capacity ( )       Able to understand and use rate of perceived exertion (RPE) scale Yes       Intervention Provide education and explanation on how to use RPE scale       Expected Outcomes Short Term: Able to use RPE daily in rehab to express subjective intensity level;Long Term:  Able to use RPE to guide intensity level when exercising independently       Able to understand and use Dyspnea scale Yes       Intervention Provide education and explanation on how to use Dyspnea scale       Expected Outcomes Short Term: Able to use Dyspnea scale daily in rehab to express subjective sense of shortness of breath during exertion;Long Term: Able to use Dyspnea scale to guide intensity level when exercising independently       Knowledge and understanding of Target Heart Rate Range (THRR) Yes       Intervention Provide education and explanation of THRR including how the numbers were predicted and where they are located for reference       Expected Outcomes Short Term: Able to use daily as guideline for intensity in rehab;Long Term: Able to use THRR to govern intensity when exercising independently;Short Term: Able to state/look up THRR       Able to check pulse independently Yes       Intervention Provide education and demonstration on how to check pulse in carotid and radial arteries.;Review the importance of being able to check your own pulse for safety during independent exercise       Expected Outcomes  Short Term: Able to explain why pulse checking is important during independent exercise;Long Term: Able to check pulse independently and accurately       Understanding of Exercise Prescription Yes       Intervention Provide education, explanation, and written materials on patient's individual exercise prescription       Expected Outcomes Short Term: Able to explain program exercise prescription;Long Term: Able to explain home exercise prescription to exercise independently                Exercise Goals Re-Evaluation :  Exercise Goals Re-Evaluation     Row Name 01/27/24 1610 02/28/24 0934           Exercise Goal Re-Evaluation   Exercise Goals Review Able to understand  and use Dyspnea scale;Understanding of Exercise Prescription;Able to understand and use rate of perceived exertion (RPE) scale;Knowledge and understanding of Target Heart Rate Range (THRR) Increase Strength and Stamina;Able to understand and use Dyspnea scale;Able to check pulse independently;Able to understand and use rate of perceived exertion (RPE) scale;Increase Physical Activity      Comments Reviewed RPE and dyspnea scale, THR and program prescription with pt today.  Pt voiced understanding and was given a copy of goals to take home. Robert Baldwin states he has a machine at home that he uses that onyl works his legs that he uses 2 times a day for 10 mins at a time at a low speed. He wishes he could walk some more outside but states that his knees bother him when he walks for a while.      Expected Outcomes Short: Use RPE daily to regulate intensity.  Long: Follow program prescription in THR. Short: Continue to attend rehab. Long: Strengthen knees and legs so he can enjoy walking more.               Discharge Exercise Prescription (Final Exercise Prescription Changes):  Exercise Prescription Changes - 01/23/24 1400       Response to Exercise   Blood Pressure (Admit) 180/80    Blood Pressure (Exercise) 164/70    Blood  Pressure (Exit) 148/70    Heart Rate (Admit) 43 bpm    Heart Rate (Exercise) 78 bpm    Heart Rate (Exit) 53 bpm    Oxygen  Saturation (Admit) 90 %    Oxygen  Saturation (Exercise) 80 %    Oxygen  Saturation (Exit) 91 %    Rating of Perceived Exertion (Exercise) 12    Perceived Dyspnea (Exercise) 2    Duration Continue with 30 min of aerobic exercise without signs/symptoms of physical distress.    Intensity THRR unchanged      Progression   Progression Continue to progress workloads to maintain intensity without signs/symptoms of physical distress.             Nutrition:  Target Goals: Understanding of nutrition guidelines, daily intake of sodium 1500mg , cholesterol 200mg , calories 30% from fat and 7% or less from saturated fats, daily to have 5 or more servings of fruits and vegetables.  Biometrics:  Pre Biometrics - 01/23/24 1450       Pre Biometrics   Height 5\' 4"  (1.626 m)    Weight 171 lb 15.3 oz (78 kg)    Waist Circumference 40 inches    Hip Circumference 41 inches    Waist to Hip Ratio 0.98 %    BMI (Calculated) 29.5    Grip Strength 10.4 kg              Nutrition Therapy Plan and Nutrition Goals:   Nutrition Assessments:  MEDIFICTS Score Key: >=70 Need to make dietary changes  40-70 Heart Healthy Diet <= 40 Therapeutic Level Cholesterol Diet  Flowsheet Row PULMONARY REHAB OTHER RESP ORIENTATION from 01/23/2024 in [facility] CARDIAC REHABILITATION  Picture Your Plate Total Score on Admission 40      Picture Your Plate Scores: <08 Unhealthy dietary pattern with much room for improvement. 41-50 Dietary pattern unlikely to meet recommendations for good health and room for improvement. 51-60 More healthful dietary pattern, with some room for improvement.  >60 Healthy dietary pattern, although there may be some specific behaviors that could be improved.    Nutrition Goals Re-Evaluation:  Nutrition Goals Re-Evaluation     Row Name 02/28/24  4098              Goals   Current Weight 171 lb 14.4 oz (78 kg)       Nutrition Goal Continue to eat healthy.       Comment Orlin states that he is eating very healthy. States that he has quit going out to eat and his wife is making all their meals at home. States the grease in restaurants was making him sick. He is drinking enough water as well.       Expected Outcome Short: Continue to attend rehab. Long: Continue to follow a healthy diet                Nutrition Goals Discharge (Final Nutrition Goals Re-Evaluation):  Nutrition Goals Re-Evaluation - 02/28/24 0929       Goals   Current Weight 171 lb 14.4 oz (78 kg)    Nutrition Goal Continue to eat healthy.    Comment Robert Baldwin states that he is eating very healthy. States that he has quit going out to eat and his wife is making all their meals at home. States the grease in restaurants was making him sick. He is drinking enough water as well.    Expected Outcome Short: Continue to attend rehab. Long: Continue to follow a healthy diet             Psychosocial: Target Goals: Acknowledge presence or absence of significant depression and/or stress, maximize coping skills, provide positive support system. Participant is able to verbalize types and ability to use techniques and skills needed for reducing stress and depression.  Initial Review & Psychosocial Screening:  Initial Psych Review & Screening - 01/14/24 1305       Initial Review   Current issues with None Identified      Family Dynamics   Good Support System? Yes   Wife lives with him.     Barriers   Psychosocial barriers to participate in program There are no identifiable barriers or psychosocial needs.      Screening Interventions   Interventions Encouraged to exercise    Expected Outcomes Short Term goal: Utilizing psychosocial counselor, staff and physician to assist with identification of specific Stressors or current issues interfering with healing process. Setting desired  goal for each stressor or current issue identified.;Long Term Goal: Stressors or current issues are controlled or eliminated.;Short Term goal: Identification and review with participant of any Quality of Life or Depression concerns found by scoring the questionnaire.;Long Term goal: The participant improves quality of Life and PHQ9 Scores as seen by post scores and/or verbalization of changes             Quality of Life Scores:  Scores of 19 and below usually indicate a poorer quality of life in these areas.  A difference of  2-3 points is a clinically meaningful difference.  A difference of 2-3 points in the total score of the Quality of Life Index has been associated with significant improvement in overall quality of life, self-image, physical symptoms, and general health in studies assessing change in quality of life.   PHQ-9: Review Flowsheet       01/23/2024  Depression screen PHQ 2/9  Decreased Interest 0  Down, Depressed, Hopeless 0  PHQ - 2 Score 0  Altered sleeping 0  Tired, decreased energy 0  Change in appetite 0  Feeling bad or failure about yourself  0  Trouble concentrating 0  Moving slowly or fidgety/restless 0  Suicidal  thoughts 0  PHQ-9 Score 0   Interpretation of Total Score  Total Score Depression Severity:  1-4 = Minimal depression, 5-9 = Mild depression, 10-14 = Moderate depression, 15-19 = Moderately severe depression, 20-27 = Severe depression   Psychosocial Evaluation and Intervention:  Psychosocial Evaluation - 01/14/24 1308       Psychosocial Evaluation & Interventions   Interventions Relaxation education;Encouraged to exercise with the program and follow exercise prescription    Comments Robert Baldwin is a pleasant gentleman who is coming to rehab for a diagnosis of pulmonary HTN. He was recently hospitalized on 1/25 with PNA where he got discharged with O2, but hasn't used it in the last little while as he states he doesn't need it. Robert Baldwin uses a cane for  ambulation, which he says he doesn't need it but because his wife wants him to use it for safety reasons. He lives at home with his wife and dog who are his support system. 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 He denies any current stressors in life. His current exercise consists of what the home PT tells him to do, which he says he has 2 more sessions of that left. Robert Baldwin is eager to come exercise to get his strenth back.    Expected Outcomes Short: Increase strength and stamina. Long: Be able to walk without a cane.    Continue Psychosocial Services  Follow up required by staff             Psychosocial Re-Evaluation:  Psychosocial Re-Evaluation     Row Name 02/28/24 [phone]             Psychosocial Re-Evaluation   Current issues with None Identified       Comments Robert Baldwin is doing well in rehab. He states he does not have any stressors going on in his life and he enjoys coming here. His wife is his biggest support system and stays on him about his health.       Expected Outcomes Short: Continue to attend rehab. Long: Continue to have stress free techniques.       Interventions Encouraged to attend Pulmonary Rehabilitation for the exercise       Continue Psychosocial Services  Follow up required by staff                Psychosocial Discharge (Final Psychosocial Re-Evaluation):  Psychosocial Re-Evaluation - 02/28/24 0927       Psychosocial Re-Evaluation   Current issues with None Identified    Comments Robert Baldwin is doing well in rehab. He states he does not have any stressors going on in his life and he enjoys coming here. His wife is his biggest support system and stays on him about his health.    Expected Outcomes Short: Continue to attend rehab. Long: Continue to have stress free techniques.    Interventions Encouraged to attend Pulmonary Rehabilitation for the exercise    Continue Psychosocial Services  Follow up required by staff               Education: Education Goals: Education classes will be provided on a weekly basis, covering required topics. Participant will state understanding/return demonstration of topics presented.  Learning Barriers/Preferences:  Learning Barriers/Preferences - 01/14/24 1308       Learning Barriers/Preferences   Learning Barriers Sight   Wears glasses   Learning Preferences None             Education Topics: How  Lungs Work and Diseases: - Discuss the anatomy of the lungs and diseases that can affect the lungs, such as COPD.   Exercise: -Discuss the importance of exercise, FITT principles of exercise, normal and abnormal responses to exercise, and how to exercise safely.   Environmental Irritants: -Discuss types of environmental irritants and how to limit exposure to environmental irritants.   Meds/Inhalers and oxygen : - Discuss respiratory medications, definition of an inhaler and oxygen , and the proper way to use an inhaler and oxygen .   Energy Saving Techniques: - Discuss methods to conserve energy and decrease shortness of breath when performing activities of daily living.    Bronchial Hygiene / Breathing Techniques: - Discuss breathing mechanics, pursed-lip breathing technique,  proper posture, effective ways to clear airways, and other functional breathing techniques   Cleaning Equipment: - Provides group verbal and written instruction about the health risks of elevated stress, cause of high stress, and healthy ways to reduce stress.   Nutrition I: Fats: - Discuss the types of cholesterol, what cholesterol does to the body, and how cholesterol levels can be controlled.   Nutrition II: Labels: -Discuss the different components of food labels and how to read food labels.   Respiratory Infections: - Discuss the signs and symptoms of respiratory infections, ways to prevent respiratory infections, and the importance of seeking medical treatment when  having a respiratory infection.   Stress I: Signs and Symptoms: - Discuss the causes of stress, how stress may lead to anxiety and depression, and ways to limit stress. Flowsheet Row PULMONARY REHAB OTHER RESPIRATORY from 03/04/2024 in [facility]  Date 03/04/24  Educator [facility]  Instruction Review Code 1- Verbalizes Understanding       Stress II: Relaxation: -Discuss relaxation techniques to limit stress. Flowsheet Row PULMONARY REHAB OTHER RESPIRATORY from 03/04/2024 in [facility]  Date 03/04/24  Educator [facility]  Instruction Review Code 1- Verbalizes Understanding       Oxygen  for Home/Travel: - Discuss how to prepare for travel when on oxygen  and proper ways to transport and store oxygen  to ensure safety.   Knowledge Questionnaire Score:  Knowledge Questionnaire Score - 01/23/24 1520       Knowledge Questionnaire Score   Pre Score 12/18             Core Components/Risk Factors/Patient Goals at Admission:  Personal Goals and Risk Factors at Admission - 01/14/24 1358       Core Components/Risk Factors/Patient Goals on Admission    Weight Management Yes    Intervention Weight Management: Develop a combined nutrition and exercise program designed to reach desired caloric intake, while maintaining appropriate intake of nutrient and fiber, sodium and fats, and appropriate energy expenditure required for the weight goal.;Weight Management: Provide education and appropriate resources to help participant work on and attain dietary goals.;Weight Management/Obesity: Establish reasonable short term and long term weight goals.;Obesity: Provide education and appropriate resources to help participant work on and attain dietary goals.    Expected Outcomes Short Term: Continue to assess and modify interventions until short term weight is achieved;Long Term: Adherence to nutrition and physical activity/exercise program aimed toward attainment of  established weight goal;Weight Maintenance: Understanding of the daily nutrition guidelines, which includes 25-35% calories from fat, 7% or less cal from saturated fats, less than 200mg  cholesterol, less than 1.5gm of sodium, & 5 or more servings of fruits and vegetables daily;Understanding recommendations for meals to include 15-35% energy as protein, 25-35% energy from fat, 35-60% energy  from carbohydrates, less than 200mg  of dietary cholesterol, 20-35 gm of total fiber daily    Improve shortness of breath with ADL's Yes    Intervention Provide education, individualized exercise plan and daily activity instruction to help decrease symptoms of SOB with activities of daily living.    Expected Outcomes Short Term: Improve cardiorespiratory fitness to achieve a reduction of symptoms when performing ADLs;Long Term: Be able to perform more ADLs without symptoms or delay the onset of symptoms    Increase knowledge of respiratory medications and ability to use respiratory devices properly  Yes    Intervention Provide education and demonstration as needed of appropriate use of medications, inhalers, and oxygen  therapy.    Expected Outcomes Short Term: Achieves understanding of medications use. Understands that oxygen  is a medication prescribed by physician. Demonstrates appropriate use of inhaler and oxygen  therapy.;Long Term: Maintain appropriate use of medications, inhalers, and oxygen  therapy.    Diabetes Yes    Intervention Provide education about proper nutrition, including hydration, and aerobic/resistive exercise prescription along with prescribed medications to achieve blood glucose in normal ranges: Fasting glucose 65-99 mg/dL;Provide education about signs/symptoms and action to take for hypo/hyperglycemia.    Expected Outcomes Short Term: Participant verbalizes understanding of the signs/symptoms and immediate care of hyper/hypoglycemia, proper foot care and importance of medication, aerobic/resistive  exercise and nutrition plan for blood glucose control.;Long Term: Attainment of HbA1C < 7%.    Hypertension Yes    Intervention Monitor prescription use compliance.;Provide education on lifestyle modifcations including regular physical activity/exercise, weight management, moderate sodium restriction and increased consumption of fresh fruit, vegetables, and low fat dairy, alcohol  moderation, and smoking cessation.    Expected Outcomes Short Term: Continued assessment and intervention until BP is < 140/36mm HG in hypertensive participants. < 130/64mm HG in hypertensive participants with diabetes, heart failure or chronic kidney disease.;Long Term: Maintenance of blood pressure at goal levels.    Lipids Yes    Intervention Provide education and support for participant on nutrition & aerobic/resistive exercise along with prescribed medications to achieve LDL 70mg , HDL >40mg .    Expected Outcomes Short Term: Participant states understanding of desired cholesterol values and is compliant with medications prescribed. Participant is following exercise prescription and nutrition guidelines.;Long Term: Cholesterol controlled with medications as prescribed, with individualized exercise RX and with personalized nutrition plan. Value goals: LDL < 70mg , HDL > 40 mg.             Core Components/Risk Factors/Patient Goals Review:   Goals and Risk Factor Review     Row Name 02/28/24 0931             Core Components/Risk Factors/Patient Goals Review   Personal Goals Review Lipids;Diabetes;Improve shortness of breath with ADL's;Hypertension       Review Robert Baldwin feels that his breathing has improved significantly since starting the program. He states that his wife checks his BP and O2 at home and it runs normal. Also says his blood sugars have been running WNL as well. His wife is his great support system and stays on him about his health.       Expected Outcomes Short: Continue to use PLB and other breathing  techniques to improve breathing. Long: Continue to check vitals at home and report to health care professionals.                Core Components/Risk Factors/Patient Goals at Discharge (Final Review):   Goals and Risk Factor Review - 02/28/24 7829  Core Components/Risk Factors/Patient Goals Review   Personal Goals Review Lipids;Diabetes;Improve shortness of breath with ADL's;Hypertension    Review Robert Baldwin feels that his breathing has improved significantly since starting the program. He states that his wife checks his BP and O2 at home and it runs normal. Also says his blood sugars have been running WNL as well. His wife is his great support system and stays on him about his health.    Expected Outcomes Short: Continue to use PLB and other breathing techniques to improve breathing. Long: Continue to check vitals at home and report to health care professionals.             ITP Comments:  ITP Comments     Row Name 01/14/24 1316 01/24/24 0812 01/27/24 0923 02/12/24 0845 03/11/24 0823   ITP Comments Completed virtual orientation today.  EP evaluation is scheduled for 01/23/24 at 1:30pm .  Documentation for diagnosis can be found in [facility] encounter 12/18/23. Patient arrived for 1st visit/orientation/education at 1315. Patient was referred to PR by Peder Bourdon, MD due to Pulmonary HTN. During orientation advised patient on arrival and appointment times what to wear, what to do before, during and after exercise. Reviewed attendance and class policy.  Pt is scheduled to return Pulmonary Rehab on 01/27/24 at 915. Pt was advised to come to class 15 minutes before class starts.  Discussed RPE/Dpysnea scales. Patient participated in warm up stretches. Patient was able to complete 6 minute walk test.  Patient was measured for the equipment. Discussed equipment safety with patient. Took patient pre-anthropometric measurements. Patient finished visit at 1415. First full day of exercise!  Patient was oriented  to gym and equipment including functions, settings, policies, and procedures.  Patient's individual exercise prescription and treatment plan were reviewed.  All starting workloads were established based on the results of the 6 minute walk test done at initial orientation visit.  The plan for exercise progression was also introduced and progression will be customized based on patient's performance and goals. 30 day review completed. ITP sent to Dr.Jehanzeb Memon, Medical Director of  Pulmonary Rehab. Continue with ITP unless changes are made by physician.  Newer to program 30 day review completed. ITP sent to Dr.Jehanzeb Memon, Medical Director of  Pulmonary Rehab. Continue with ITP unless changes are made by physician.            Comments: 30 day review

## 2024-03-12 ENCOUNTER — Ambulatory Visit (HOSPITAL_COMMUNITY): Payer: Self-pay | Admitting: Cardiology

## 2024-03-12 ENCOUNTER — Ambulatory Visit (HOSPITAL_COMMUNITY)
Admission: RE | Admit: 2024-03-12 | Discharge: 2024-03-12 | Disposition: A | Discharge status: Home / Self Care | Source: Ambulatory Visit | Attending: Cardiology | Admitting: Cardiology

## 2024-03-12 ENCOUNTER — Encounter (HOSPITAL_COMMUNITY): Payer: Self-pay | Admitting: Cardiology

## 2024-03-12 VITALS — BP 158/74 | HR 51 | Ht 64.0 in | Wt 172.6 lb

## 2024-03-12 DIAGNOSIS — N183 Chronic kidney disease, stage 3 unspecified: Secondary | ICD-10-CM | POA: Insufficient documentation

## 2024-03-12 DIAGNOSIS — G4733 Obstructive sleep apnea (adult) (pediatric): Secondary | ICD-10-CM | POA: Diagnosis not present

## 2024-03-12 DIAGNOSIS — I4821 Permanent atrial fibrillation: Secondary | ICD-10-CM | POA: Insufficient documentation

## 2024-03-12 DIAGNOSIS — E1122 Type 2 diabetes mellitus with diabetic chronic kidney disease: Secondary | ICD-10-CM | POA: Insufficient documentation

## 2024-03-12 DIAGNOSIS — I080 Rheumatic disorders of both mitral and aortic valves: Secondary | ICD-10-CM | POA: Diagnosis not present

## 2024-03-12 DIAGNOSIS — I6523 Occlusion and stenosis of bilateral carotid arteries: Secondary | ICD-10-CM | POA: Insufficient documentation

## 2024-03-12 DIAGNOSIS — Z299 Encounter for prophylactic measures, unspecified: Secondary | ICD-10-CM | POA: Diagnosis not present

## 2024-03-12 DIAGNOSIS — I130 Hypertensive heart and chronic kidney disease with heart failure and stage 1 through stage 4 chronic kidney disease, or unspecified chronic kidney disease: Secondary | ICD-10-CM | POA: Insufficient documentation

## 2024-03-12 DIAGNOSIS — Z7984 Long term (current) use of oral hypoglycemic drugs: Secondary | ICD-10-CM | POA: Insufficient documentation

## 2024-03-12 DIAGNOSIS — I4510 Unspecified right bundle-branch block: Secondary | ICD-10-CM | POA: Diagnosis not present

## 2024-03-12 DIAGNOSIS — N184 Chronic kidney disease, stage 4 (severe): Secondary | ICD-10-CM | POA: Diagnosis not present

## 2024-03-12 DIAGNOSIS — Z7985 Long-term (current) use of injectable non-insulin antidiabetic drugs: Secondary | ICD-10-CM | POA: Insufficient documentation

## 2024-03-12 DIAGNOSIS — I10 Essential (primary) hypertension: Secondary | ICD-10-CM | POA: Diagnosis not present

## 2024-03-12 DIAGNOSIS — Z79899 Other long term (current) drug therapy: Secondary | ICD-10-CM | POA: Diagnosis not present

## 2024-03-12 DIAGNOSIS — M25569 Pain in unspecified knee: Secondary | ICD-10-CM | POA: Diagnosis not present

## 2024-03-12 DIAGNOSIS — Z7901 Long term (current) use of anticoagulants: Secondary | ICD-10-CM | POA: Diagnosis not present

## 2024-03-12 DIAGNOSIS — I5032 Chronic diastolic (congestive) heart failure: Secondary | ICD-10-CM

## 2024-03-12 DIAGNOSIS — I4891 Unspecified atrial fibrillation: Secondary | ICD-10-CM | POA: Diagnosis not present

## 2024-03-12 DIAGNOSIS — E1169 Type 2 diabetes mellitus with other specified complication: Secondary | ICD-10-CM | POA: Diagnosis not present

## 2024-03-12 DIAGNOSIS — Z8249 Family history of ischemic heart disease and other diseases of the circulatory system: Secondary | ICD-10-CM | POA: Diagnosis not present

## 2024-03-12 DIAGNOSIS — I2721 Secondary pulmonary arterial hypertension: Secondary | ICD-10-CM | POA: Insufficient documentation

## 2024-03-12 LAB — BASIC METABOLIC PANEL WITH GFR
Anion gap: 11 (ref 5–15)
BUN: 70 mg/dL — ABNORMAL HIGH (ref 8–23)
CO2: 27 mmol/L (ref 22–32)
Calcium: 9.2 mg/dL (ref 8.9–10.3)
Chloride: 101 mmol/L (ref 98–111)
Creatinine, Ser: 2.49 mg/dL — ABNORMAL HIGH (ref 0.61–1.24)
GFR, Estimated: 25 mL/min — ABNORMAL LOW (ref >60)
Glucose, Bld: 131 mg/dL — ABNORMAL HIGH (ref 70–99)
Potassium: 4.5 mmol/L (ref 3.5–5.1)
Sodium: 139 mmol/L (ref 135–145)

## 2024-03-12 LAB — BRAIN NATRIURETIC PEPTIDE: B Natriuretic Peptide: 642.6 pg/mL — ABNORMAL HIGH (ref 0.0–100.0)

## 2024-03-12 MED ORDER — HYDRALAZINE HCL 25 MG PO TABS: 37.5000 mg | ORAL_TABLET | Freq: Three times a day (TID) | ORAL | 6 refills | Status: DC

## 2024-03-12 NOTE — Progress Notes (Signed)
 6 Min Walk Test Completed  Pt ambulated 836ft (243.72m) O2 Sat ranged 90-85 HR ranged 84-76

## 2024-03-12 NOTE — Progress Notes (Signed)
 Advanced Heart Failure Clinic Note   PCP: Theoplis Fix, MD Primary Cardiologist: Dr. Londa Rival Nephrologist: Dr. Cindra Cree HF Cardiologist: Dr. Mitzie Anda  Chief complaint: CHF  HPI: Robert Baldwin is a 83 y.o. male with history of carotid artery stenosis, DM II, HTN, HLD, permanent atrial fibrillation. He follows with Dr. Londa Rival in the cardiology clinic.    Saw [facility] for carotid artery stenosis in 6/23. Was noting more dyspnea with exertion and lower extremity edema for about a month. He was subsequently started on po lasix  20 mg daily.   He was seen by Dr. Londa Rival in the clinic 04/26/22 for evaluation of recent symptoms and pre-operative evaluation in preparation for left CEA followed by staged right CEA. Echo day of the visit showed EF 70-75%, hyperdynamic LV function, mild LVH, interventricular septum flattened in systole and diastole consistent with RV pressure and volume overload, RV moderately enlarged and moderately reduced, RVSP 120 mmHg, moderate LAE, severe RAE, moderate pericardial effusion, severe TR, dilated IVC. Patient was scheduled for outpatient R/LHC to better assess hemodynamics and further characterize pulmonary hypertension.    [facility] 7/23 showed no significant CAD, RA mean 25, PA 115/30 (58), LVEDP 22 mmHg, PCWP not obtained, Fick CO/CI 6.4/3.1, PAPi 3.4. PVR 5.6 WU. 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 He was diuresed with IV lasix . V/Q scan negative for chronic PE. ANA+. All other serologic test -. HRCT with no definite ILD. Started on tadalafil . Diuresed 24 lbs. Hospitalization complicated by right groin hematoma and received 1 unit PRBCs. Discharged home, weight 179 lbs.  Repeat RHC 8/23 showed elevated PCWP and severe mixed pulmonary arterial/venous hypertension, but PA pressures significantly lower than prior RHC. He was instructed to increase torsemide  to 40 mg daily alternating with 20 mg every other day. Spiro  and Entresto  held with hyperkalemia.  Patient had awake left TCAR by Dr. Fulton Job in 9/23, no complications.  S/p right TCAR 10/23. Tolerated procedure well.   Echo 7/24 showed EF 60-65%, D-shaped interventricular septum suggestive of RV pressure/volume overload, moderate RV enlargement with mildly decreased systolic function, PASP 78 mmHg, mild mitral stenosis mean gradient 4, mild aortic stenosis mean gradient 15.   Follow up 7/24, creatinine elevated 2.35>>3.32. 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 Torsemide  was increased to 40 mg bid.  Patient was admitted in 1/25 with multifocal PNA and volume overload at [facility].  He was diuresed and was treated with antibiotics.  He was sent home on oxygen  but is no longer using it. Echo in 1/25 showed EF 65-70%, moderate RV enlargement with moderate RV dysfunction, severe biatrial enlargement, mild mitral stenosis with mean gradient 4.7 mmHg, moderate TR, mild aortic stenosis with mean gradient 11 mmHg, PASP 76 mmHg.   Today he returns for HF follow up.  Breathing is getting better, seems to be mostly recovered from the episode of PNA he had earlier this year.  6 minute walk done today was improved.  He says that knee pain is more limiting for him than dyspnea.  No dyspnea walking on flat ground or up a flight of stairs.  No chest pain.  No lightheadedness.  No orthopnea/PND. BP has been elevated. 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  Doing pulmonary rehab.   ECG (  personally reviewed): Atrial fibrillation, RBBB  Labs (6/23): K 3.8, creatinine 1.53 Labs (7/23): K 5.5, creatinine 1.73 Labs (8/23): K 4.6, creatinine 1.5 Labs (9/23): LDL 56, K 3.9, creatinine 1.61 Labs (10/23): K 4.4, creatinine  1.94 Labs (11/23): K 5, creatinine 2.35 Labs (7/24): LDL 47, hgb 10.4, K 5.3, creatinine 3.32, BUN 102 Labs (9/24): K 4.0, creatinine 2.15 Labs (2/25): K 3.9, creatinine 1.75, LDL 50 Labs (3/25): K 4.5, creatinine 2.29  6 minute walk (7/24): 259 m 6 minute walk (9/24): 195 m 6 minutes walk (5/25): 243 m  PMH: 1. HTN 2. Hyperlipidemia 3. Type 2 diabetes 4. CKD stage 3 5. Atrial fibrillation: Permanent.  6. Carotid stenosis: CTA neck (6/23) with high grade stenosis of the bilateral internal carotid arteries.  - S/p left TCAR 9/23.  - S/p right TCAR 10/23. - Carotid doppler (9/24): LICA stent 50-75% stenosis, RICA stent 50-75% stenosis.  7. 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  - R/LHC (7/23): no significant CAD; RA mean 25, PA 115/30 (58), LVEDP 22 mmHg, PCWP not obtained, Fick CO/CI 6.4/3.1, PAPi 3.4. PVR 5.6 WU. - RHC (8/23): Mean RA 8, PA 85/40 mean 41, mean PCWP 22, CI 2.86, PVR 3.4 WU - Echo (7/24): EF 60-65%, D-shaped interventricular septum suggestive of RV pressure/volume overload, moderate RV enlargement with mildly decreased systolic function, PASP 78 mmHg, mild mitral stenosis mean gradient 4, mild aortic stenosis mean gradient 15.  - RHC (7/24): RA mean 9, PA 78/16 (44), PCWP 17 (prominent v-waves to 31), CO/CI (Fick) 7.63/4.09, PVR 3.54 WU - Echo (1/25): EF 65-70%, moderate RV enlargement with moderate RV dysfunction, severe biatrial enlargement, mild mitral stenosis with mean gradient 4.7 mmHg, moderate TR, mild aortic stenosis with mean gradient 11 mmHg, PASP 76 mmHg.  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  See RHC and echo data above.   -  V/Q scan (7/23): No evidence for chronic PE.  - High resolution CT chest (7/23): No definite ILD.  - + ANA -  RHC (7/24): RA mean 9, PA 78/16 (44), PCWP 17 (prominent v-waves to 31), CO/CI (Fick) 7.63/4.09, PVR 3.54 WU  Current Outpatient Medications  Medication Sig Dispense Refill   atorvastatin  (LIPITOR) 40 MG tablet Take 40 mg by mouth at bedtime.     cholecalciferol  (VITAMIN D ) 25 MCG (1000 UNIT) tablet Take 1,000 Units by mouth daily.     FARXIGA  10 MG TABS tablet TAKE 1 TABLET BY MOUTH DAILY 30 tablet 4   fish oil-omega-3 fatty acids  1000 MG capsule Take 1 g by mouth every evening.     macitentan  (OPSUMIT ) 10 MG tablet TAKE 1 TABLET BY MOUTH DAILY 90 tablet 3   Misc Natural Products (GLUCOS-CHONDROIT-MSM COMPLEX) TABS Take 2 tablets by mouth daily.     Multiple Vitamins-Minerals (MULTIVITAMIN WITH MINERALS) tablet Take 1 tablet by mouth in the morning.  Centrum Silver     niacin  500 MG tablet Take 500 mg by mouth in the morning.     olmesartan (BENICAR) 5 MG tablet Take 10 mg by mouth daily.     potassium chloride  (KLOR-CON  M) 10 MEQ tablet Take 10 mEq by mouth daily.     tadalafil  (CIALIS ) 20 MG tablet TAKE 2 TABLETS  BY MOUTH DAILY 180 tablet 3   torsemide  (DEMADEX ) 20 MG tablet TAKE 4 TABLETS BY MOUTH EVERY MORNING AND 2 TABLETS EVERY EVENING 220 tablet 5   warfarin (COUMADIN ) 5 MG tablet Take 5-7.5 mg by mouth See admin instructions. Take 5 mg on Saturday,Sunday Tues, Wed, Thursday, and then  Take 7.5 mg on Monday,  Wednesday, and Friday     hydrALAZINE  (APRESOLINE ) 25 MG tablet Take 1.5 tablets (37.5 mg total) by mouth 3 (three) times daily. 90 tablet 6   No current facility-administered medications for this encounter.   Allergies  Allergen Reactions   Other Rash    Bandaids   Social History   Socioeconomic History   Marital status: Married    Spouse name: Tammy   Number of children: Not on file   Years of education: Not on file   Highest education level: Not on file   Occupational History   Occupation: Full time    Comment: Contractor  Tobacco Use   Smoking status: Never    Passive exposure: Never   Smokeless tobacco: Never   Tobacco comments:    Never smoke 05/18/22  Vaping Use   Vaping status: Never Used  Substance and Sexual Activity   Alcohol  use: No    Alcohol /week: 0.0 standard drinks of alcohol    Drug use: No   Sexual activity: Not Currently  Other Topics Concern   Not on file  Social History Narrative   Not on file   Social Drivers of Health   Financial Resource Strain: Not on file  Food Insecurity: No Food Insecurity (11/13/2023)   Hunger Vital Sign    Worried About Running Out of Food in the Last Year: Never true    Ran Out of Food in the Last Year: Never true  Transportation Needs: No Transportation Needs (11/13/2023)   PRAPARE - Administrator, Civil Service (Medical): No    Lack of Transportation (Non-Medical): No  Physical Activity: Not on file  Stress: Not on file  Social Connections: Socially Integrated (11/13/2023)   Social Connection and Isolation Panel [NHANES]    Frequency of Communication with Friends and Family: Three times a week    Frequency of Social Gatherings with Friends and Family: Once a week    Attends Religious Services: 1 to 4 times per year    Active Member of [employer] or Organizations: Yes    Attends Banker Meetings: 1 to 4 times per year    Marital Status: Married  Catering manager Violence: Not At Risk (11/13/2023)   Humiliation, Afraid, Rape, and Kick questionnaire    Fear of Current or Ex-Partner: No    Emotionally Abused: No    Physically Abused: No    Sexually Abused: No   Family History  Problem Relation Age of Onset   Stroke Other    Heart disease Other    BP (!) 158/74   Pulse (!) 51   Ht 5\' 4"  (1.626 m)   Wt 78.3 kg (172 lb 9.6 oz)   SpO2 95%   BMI 29.63 kg/m   Wt Readings from Last 3 Encounters:  03/12/24 78.3 kg (172 lb 9.6 oz)  01/23/24 78 kg (171 lb  15.3 oz)  01/20/24 78.6 kg (173 lb 3.2 oz)   PHYSICAL EXAM: General: NAD Neck: No JVD, no thyromegaly or thyroid nodule.  Lungs: Occasional rhonchi CV: Nondisplaced PMI.  Heart irregular S1/S2, no S3/S4, no murmur.  No peripheral edema.  No carotid bruit.  Normal pedal pulses.  Abdomen: Soft, nontender, no hepatosplenomegaly, no distention.  Skin: Intact without lesions or rashes.  Neurologic: Alert and oriented x 3.  Psych: Normal affect. Extremities: No clubbing or cyanosis.  HEENT: Normal.   ASSESSMENT & PLAN: 1. 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 RHC/LHC 7/23 showed preserved cardiac output with severe pulmonary hypertension. 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  PA pressure was still high, but is significantly lower than prior RHC.  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 Echo in 1/25 showed EF 65-70%, moderate RV enlargement with moderate RV dysfunction, severe biatrial enlargement, mild mitral stenosis with mean gradient 4.7 mmHg, moderate TR, mild aortic stenosis with mean gradient 11 mmHg, PASP 76 mmHg. NYHA class II.  Weight up 2 lbs but he does not look volume overloaded on exam.  Creatinine higher at 2.29 most recently.  - Continue torsemide  80 qam/40 qpm.  BMET/BNP today.  If creatinine is higher, would consider cutting back on torsemide .  - Continue Farxiga  10 mg daily.  - He is on olmesartan 10 mg daily.  2.  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  Some improvement on 8/23 RHC still with severe mixed PAH/PVH with PVR down to 3.4 WU.  He has never smoked and has no known lung disease.  He has been on warfarin for atrial fibrillation long-term. No known rheumatologic illness.  Not a drinker though he has some signs of cirrhosis by prior liver imaging.  V/Q scan in 7/23 negative for chronic PEs and HRCT chest without definite ILD. Serologic workup showed anti-SCL 70 neg, anti-centromere Ab neg, RF neg, ANA with reflex positive. Suspicion for component of group 1 PH (mixed group 1 and 2).  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  6 minute walk today was improved.  - Diuretics as above. - Continue Opsumit  10 mg daily. - Continue tadalafil  40 mg daily. - Has OSA but unable to tolerate CPAP.   - Continue pulmonary rehab.   3. Atrial fibrillation: Permanent.   - Continue warfarin.   - Bradycardic, does not need nodal blocker. Asymptomatic 4. DM2: Continue SGLT2i 5. Carotid stenosis: CTA neck in 6/23 showed high grade bilateral ICA stenoses. He has had awake L TCAR 9/23 and R TCAR 10/23.  - He is on warfarin long-term for AF.    - Continue statin, goal LDL < 55.  Good lipids in 2/25.  6. HTN: BP elevated.  - Increase hydralazine  to 37.5 mg bid.  - He is also on olmesartan 10 mg daily.  7. CKD stage IIIb:  - Continue SGLT2i.  - BMET today.  8. Aortic stenosis: Mild on 1/25 echo.  9. Mitral stenosis: Mild on 1/25 echo.   Follow up in 4 months with APP.   I spent 32 minutes reviewing records, interviewing/examining patient, and managing orders.    Peder Bourdon  03/12/2024

## 2024-03-12 NOTE — Patient Instructions (Addendum)
 INCREASE Hydralazine  to 37.5 mg ( 1 1/2 Tab) Three times a day.  Labs done today, your results will be available in MyChart, we will contact you for abnormal readings.  Your physician recommends that you schedule a follow-up appointment in: 4 months.  If you have any questions or concerns before your next appointment please send us  a message through [place] or call our office at [phone].    TO LEAVE A MESSAGE FOR THE NURSE SELECT OPTION 2, PLEASE LEAVE A MESSAGE INCLUDING: YOUR NAME DATE OF BIRTH CALL BACK NUMBER REASON FOR CALL**this is important as we prioritize the call backs  YOU WILL RECEIVE A CALL BACK THE SAME DAY AS LONG AS YOU CALL BEFORE 4:00 PM  At the [facility], you and your health needs are our priority. As part of our continuing mission to provide you with exceptional heart care, we have created designated Provider Care Teams. These Care Teams include your primary Cardiologist (physician) and Advanced Practice Providers (APPs- Physician Assistants and Nurse Practitioners) who all work together to provide you with the care you need, when you need it.   You may see any of the following providers on your designated Care Team at your next follow up: Dr Jules Oar Dr Peder Bourdon Dr. Alwin Baars Dr. Arta Lark Amy Marijane Shoulders, NP Ruddy Corral, [facility] [place], [place] Dennise Fitz, NP [place] Lee, NP Shawnee Dellen, NP Luster Salters, PharmD Bevely Brush, PharmD   Please be sure to bring in all your medications bottles to every appointment.    Thank you for choosing [facility]

## 2024-03-13 ENCOUNTER — Encounter (HOSPITAL_COMMUNITY)
Admission: RE | Admit: 2024-03-13 | Discharge: 2024-03-13 | Disposition: A | Discharge status: Home / Self Care | Source: Ambulatory Visit | Attending: Cardiology | Admitting: Cardiology

## 2024-03-13 DIAGNOSIS — I272 Pulmonary hypertension, unspecified: Secondary | ICD-10-CM

## 2024-03-13 MED ORDER — TORSEMIDE 20 MG PO TABS: 80.0000 mg | ORAL_TABLET | Freq: Every day | ORAL | Status: DC

## 2024-03-13 NOTE — Progress Notes (Signed)
 Daily Session Note  Patient Details  Name: Robert Baldwin MRN: [id number] Date of Birth: May 17, 1941 Referring Provider:   Flowsheet Row PULMONARY REHAB OTHER RESP ORIENTATION from 01/23/2024 in [facility] CARDIAC REHABILITATION  Referring Provider Peder Bourdon MD       Encounter Date: 03/13/2024  Check In:  Session Check In - 03/13/24 0910       Check-In   Supervising physician immediately available to respond to emergencies See telemetry face sheet for immediately available MD    Location [facility]-Cardiac & Pulmonary Rehab    Staff Present Clotilda Danish, BS, Exercise Physiologist;Jessica Zoila Hines, MA, RCEP, CCRP, CCET    Virtual Visit No    Medication changes reported     No    Fall or balance concerns reported    No    Tobacco Cessation No Change    Warm-up and Cool-down Performed on first and last piece of equipment    Resistance Training Performed Yes    VAD Patient? No    PAD/SET Patient? No      Pain Assessment   Currently in Pain? No/denies    Multiple Pain Sites No             Capillary Blood Glucose: Results for orders placed or performed during the hospital encounter of 03/12/24 (from the past 24 hours)  Basic Metabolic Panel (BMET)     Status: Abnormal   Collection Time: 03/12/24  9:17 AM  Result Value Ref Range   Sodium 139 135 - 145 mmol/L   Potassium 4.5 3.5 - 5.1 mmol/L   Chloride 101 98 - 111 mmol/L   CO2 27 22 - 32 mmol/L   Glucose, Bld 131 (H) 70 - 99 mg/dL   BUN 70 (H) 8 - 23 mg/dL   Creatinine, Ser 6.29 (H) 0.61 - 1.24 mg/dL   Calcium  9.2 8.9 - 10.3 mg/dL   GFR, Estimated 25 (L) >60 mL/min   Anion gap 11 5 - 15  B Nat Peptide     Status: Abnormal   Collection Time: 03/12/24  9:17 AM  Result Value Ref Range   B Natriuretic Peptide 642.6 (H) 0.0 - 100.0 pg/mL      Social History   Tobacco Use  Smoking Status Never   Passive exposure: Never  Smokeless Tobacco Never  Tobacco Comments   Never smoke 05/18/22    Goals Met:  Independence  with exercise equipment Exercise tolerated well No report of concerns or symptoms today Strength training completed today  Goals Unmet:  Not Applicable  Comments: Pt able to follow exercise prescription today without complaint.  Will continue to monitor for progression.

## 2024-03-13 NOTE — Telephone Encounter (Signed)
-----   Message from [employer] sent at 03/12/2024  3:20 PM EDT ----- Decrease torsemide  to 80 mg daily and tell him to watch sodium intake closely.  Get BMET in 10 days.

## 2024-03-13 NOTE — Telephone Encounter (Signed)
 Spoke with patients wife and made her aware of results and instructions, she verbalized understanding. Patient has a nephrology apt on 6/3- will have labs repeated at that time. Also placed order for BMP for [facility] in case this is needed. Patients wife also educated on sodium intake and she verbalized understanding.   Medication list updated, and orders placed.   Advised her to call back with any issues or concerns and she verbalized understanding.

## 2024-03-16 ENCOUNTER — Encounter (HOSPITAL_COMMUNITY)

## 2024-03-18 ENCOUNTER — Encounter (HOSPITAL_COMMUNITY)
Admission: RE | Admit: 2024-03-18 | Discharge: 2024-03-18 | Disposition: A | Discharge status: Home / Self Care | Source: Ambulatory Visit | Attending: Cardiology

## 2024-03-18 DIAGNOSIS — I272 Pulmonary hypertension, unspecified: Secondary | ICD-10-CM

## 2024-03-18 NOTE — Progress Notes (Signed)
 Daily Session Note  Patient Details  Name: ARLEN DUPUIS MRN: [id number] Date of Birth: 01-08-1941 Referring Provider:   Flowsheet Row PULMONARY REHAB OTHER RESP ORIENTATION from 01/23/2024 in [facility] CARDIAC REHABILITATION  Referring Provider Peder Bourdon MD       Encounter Date: 03/18/2024  Check In:  Session Check In - 03/18/24 0915       Check-In   Supervising physician immediately available to respond to emergencies See telemetry face sheet for immediately available MD    Location [facility]-Cardiac [facility] Pulmonary [facility]    Staff Present Clotilda Danish, BS, Exercise Physiologist;Jessica Zoila Hines, MA, RCEP, CCRP, CCET    Virtual Visit No    Medication changes reported     No    Fall or balance concerns reported    No    Tobacco Cessation No Change    Warm-up and Cool-down Performed on first and last piece of equipment    Resistance Training Performed Yes    VAD Patient? No    PAD/SET Patient? No      Pain Assessment   Currently in Pain? No/denies    Multiple Pain Sites No             Capillary Blood Glucose: No results found for this or any previous visit (from the past 24 hours).    Social History   Tobacco Use  Smoking Status Never   Passive exposure: Never  Smokeless Tobacco Never  Tobacco Comments   Never smoke 05/18/22    Goals Met:  Independence with exercise equipment Exercise tolerated well No report of concerns or symptoms today Strength training completed today  Goals Unmet:  Not Applicable  Comments: Pt able to follow exercise prescription today without complaint.  Will continue to monitor for progression.

## 2024-03-18 NOTE — Progress Notes (Signed)
 I have reviewed a Home Exercise Prescription with Robert Baldwin . Shaheed is  currently exercising at home by walking.  The patient was advised to walk 2 days a week for 30-45 minutes.  Jaevion and I discussed how to progress their exercise prescription.  The patient stated that their goals were to join maintance or a gym after the program and to build enduracne.  The patient stated that they understand the exercise prescription.  We reviewed exercise guidelines, target heart rate during exercise, RPE Scale, weather conditions, NTG use, endpoints for exercise, warmup and cool down.  Patient is encouraged to come to me with any questions. I will continue to follow up with the patient to assist them with progression and safety.

## 2024-03-19 ENCOUNTER — Other Ambulatory Visit (HOSPITAL_COMMUNITY): Payer: Self-pay | Admitting: Cardiology

## 2024-03-19 DIAGNOSIS — I5032 Chronic diastolic (congestive) heart failure: Secondary | ICD-10-CM

## 2024-03-20 ENCOUNTER — Encounter (HOSPITAL_COMMUNITY)
Admission: RE | Admit: 2024-03-20 | Discharge: 2024-03-20 | Disposition: A | Discharge status: Home / Self Care | Source: Ambulatory Visit | Attending: Cardiology | Admitting: Cardiology

## 2024-03-20 ENCOUNTER — Ambulatory Visit (HOSPITAL_COMMUNITY): Payer: Self-pay | Admitting: Cardiology

## 2024-03-20 DIAGNOSIS — I272 Pulmonary hypertension, unspecified: Secondary | ICD-10-CM

## 2024-03-20 DIAGNOSIS — I5032 Chronic diastolic (congestive) heart failure: Secondary | ICD-10-CM

## 2024-03-20 LAB — BASIC METABOLIC PANEL WITH GFR
BUN/Creatinine Ratio: 27 — ABNORMAL HIGH (ref 10–24)
BUN: 75 mg/dL — ABNORMAL HIGH (ref 8–27)
CO2: 21 mmol/L (ref 20–29)
Calcium: 9.1 mg/dL (ref 8.6–10.2)
Chloride: 103 mmol/L (ref 96–106)
Creatinine, Ser: 2.74 mg/dL — ABNORMAL HIGH (ref 0.76–1.27)
Glucose: 140 mg/dL — ABNORMAL HIGH (ref 70–99)
Potassium: 5.7 mmol/L — ABNORMAL HIGH (ref 3.5–5.2)
Sodium: 142 mmol/L (ref 134–144)
eGFR: 22 mL/min/{1.73_m2} — ABNORMAL LOW (ref >59)

## 2024-03-20 NOTE — Progress Notes (Signed)
 Daily Session Note  Patient Details  Name: DELANCE WEIDE MRN: [id number] Date of Birth: June 27, 1941 Referring Provider:   Flowsheet Row PULMONARY REHAB OTHER RESP ORIENTATION from 01/23/2024 in [facility] CARDIAC REHABILITATION  Referring Provider Peder Bourdon MD       Encounter Date: 03/20/2024  Check In:  Session Check In - 03/20/24 0913       Check-In   Supervising physician immediately available to respond to emergencies See telemetry face sheet for immediately available MD    Location [facility]    Staff Present Colvin Dec, RN;Adriano Bischof Zoila Hines, MA, RCEP, CCRP, CCET    Virtual Visit No    Medication changes reported     No    Fall or balance concerns reported    No    Warm-up and Cool-down Performed on first and last piece of equipment    Resistance Training Performed Yes    VAD Patient? No    PAD/SET Patient? No      Pain Assessment   Currently in Pain? No/denies             Capillary Blood Glucose: Results for orders placed or performed in visit on 03/19/24 (from the past 24 hours)  Basic Metabolic Panel (BMET)     Status: Abnormal   Collection Time: 03/19/24 11:12 AM  Result Value Ref Range   Glucose 140 (H) 70 - 99 mg/dL   BUN 75 (H) 8 - 27 mg/dL   Creatinine, Ser 8.29 (H) 0.76 - 1.27 mg/dL   eGFR 22 (L) >56 OZ/HYQ/6.57   BUN/Creatinine Ratio 27 (H) 10 - 24   Sodium 142 134 - 144 mmol/L   Potassium 5.7 (H) 3.5 - 5.2 mmol/L   Chloride 103 96 - 106 mmol/L   CO2 21 20 - 29 mmol/L   Calcium  9.1 8.6 - 10.2 mg/dL   Narrative   Performed at:  [address] [place], [place]  [id number] Lab Director: Pearlean Botts MD, Phone:  [phone]      Social History   Tobacco Use  Smoking Status Never   Passive exposure: Never  Smokeless Tobacco Never  Tobacco Comments   Never smoke 05/18/22    Goals Met:  Proper associated with RPD/PD & O2 Sat Independence with exercise equipment Using PLB without cueing &  demonstrates good technique Exercise tolerated well No report of concerns or symptoms today Strength training completed today  Goals Unmet:  Not Applicable  Comments: Pt able to follow exercise prescription today without complaint.  Will continue to monitor for progression.

## 2024-03-23 ENCOUNTER — Encounter (HOSPITAL_COMMUNITY)
Admission: RE | Admit: 2024-03-23 | Discharge: 2024-03-23 | Disposition: A | Discharge status: Home / Self Care | Source: Ambulatory Visit | Attending: Cardiology | Admitting: Cardiology

## 2024-03-23 DIAGNOSIS — R0600 Dyspnea, unspecified: Secondary | ICD-10-CM | POA: Diagnosis not present

## 2024-03-23 DIAGNOSIS — I272 Pulmonary hypertension, unspecified: Secondary | ICD-10-CM | POA: Insufficient documentation

## 2024-03-23 NOTE — Progress Notes (Signed)
 Daily Session Note  Patient Details  Name: GENERAL WEARING MRN: [id number] Date of Birth: 05/10/1941 Referring Provider:   Flowsheet Row PULMONARY REHAB OTHER RESP ORIENTATION from 01/23/2024 in [facility] CARDIAC REHABILITATION  Referring Provider Peder Bourdon MD       Encounter Date: 03/23/2024  Check In:  Session Check In - 03/23/24 0915       Check-In   Supervising physician immediately available to respond to emergencies See telemetry face sheet for immediately available MD    Location [facility]    Staff Present Jerrol Morelle, BSN, RN, WTA-C;Heather Toy Freund, BS, Exercise Physiologist    Virtual Visit No    Medication changes reported     No    Fall or balance concerns reported    No    Tobacco Cessation No Change    Warm-up and Cool-down Performed on first and last piece of equipment    Resistance Training Performed Yes    VAD Patient? No    PAD/SET Patient? No      Pain Assessment   Currently in Pain? No/denies             Capillary Blood Glucose: No results found for this or any previous visit (from the past 24 hours).    Social History   Tobacco Use  Smoking Status Never   Passive exposure: Never  Smokeless Tobacco Never  Tobacco Comments   Never smoke 05/18/22    Goals Met:  Proper associated with RPD/PD & O2 Sat Independence with exercise equipment Improved SOB with ADL's Using PLB without cueing & demonstrates good technique Exercise tolerated well No report of concerns or symptoms today Strength training completed today  Goals Unmet:  Not Applicable  Comments: Pt able to follow exercise prescription today without complaint.  Will continue to monitor for progression.

## 2024-03-24 DIAGNOSIS — I130 Hypertensive heart and chronic kidney disease with heart failure and stage 1 through stage 4 chronic kidney disease, or unspecified chronic kidney disease: Secondary | ICD-10-CM | POA: Diagnosis not present

## 2024-03-24 DIAGNOSIS — N184 Chronic kidney disease, stage 4 (severe): Secondary | ICD-10-CM | POA: Diagnosis not present

## 2024-03-24 DIAGNOSIS — I779 Disorder of arteries and arterioles, unspecified: Secondary | ICD-10-CM | POA: Diagnosis not present

## 2024-03-24 DIAGNOSIS — E1122 Type 2 diabetes mellitus with diabetic chronic kidney disease: Secondary | ICD-10-CM | POA: Diagnosis not present

## 2024-03-24 DIAGNOSIS — E785 Hyperlipidemia, unspecified: Secondary | ICD-10-CM | POA: Diagnosis not present

## 2024-03-24 DIAGNOSIS — I509 Heart failure, unspecified: Secondary | ICD-10-CM | POA: Diagnosis not present

## 2024-03-25 ENCOUNTER — Encounter (HOSPITAL_COMMUNITY)
Admission: RE | Admit: 2024-03-25 | Discharge: 2024-03-25 | Disposition: A | Discharge status: Home / Self Care | Source: Ambulatory Visit | Attending: Cardiology

## 2024-03-25 DIAGNOSIS — R0600 Dyspnea, unspecified: Secondary | ICD-10-CM | POA: Diagnosis not present

## 2024-03-25 DIAGNOSIS — I272 Pulmonary hypertension, unspecified: Secondary | ICD-10-CM

## 2024-03-25 NOTE — Progress Notes (Signed)
 Daily Session Note  Patient Details  Name: Robert Baldwin MRN: [id number] Date of Birth: [birth year] Referring Provider:   Flowsheet Row PULMONARY REHAB OTHER RESP ORIENTATION from 01/23/2024 in [facility] CARDIAC REHABILITATION  Referring Provider Peder Bourdon MD       Encounter Date: 03/25/2024  Check In:  Session Check In - 03/25/24 0930       Check-In   Supervising physician immediately available to respond to emergencies See telemetry face sheet for immediately available MD    Location [facility]    Staff Present Clotilda Danish, BS, Exercise Physiologist;S[facility] Eilene Grater, RN;Hillary Lennie Ra BSN, RN    Virtual Visit No    Medication changes reported     No    Fall or balance concerns reported    No    Resistance Training Performed Yes    VAD Patient? No    PAD/SET Patient? No      Pain Assessment   Currently in Pain? No/denies             Capillary Blood Glucose: No results found for this or any previous visit (from the past 24 hours).    Social History   Tobacco Use  Smoking Status Never   Passive exposure: Never  Smokeless Tobacco Never  Tobacco Comments   Never smoke 05/18/22    Goals Met:  Independence with exercise equipment Exercise tolerated well No report of concerns or symptoms today  Goals Unmet:  Not Applicable  Comments: Pt able to follow exercise prescription today without complaint.  Will continue to monitor for progression.

## 2024-03-26 ENCOUNTER — Other Ambulatory Visit (HOSPITAL_COMMUNITY): Payer: Self-pay

## 2024-03-26 DIAGNOSIS — I5032 Chronic diastolic (congestive) heart failure: Secondary | ICD-10-CM

## 2024-03-26 MED ORDER — HYDRALAZINE HCL 25 MG PO TABS: 50.0000 mg | ORAL_TABLET | Freq: Three times a day (TID) | ORAL | Status: DC

## 2024-03-26 MED ORDER — LOKELMA 10 G PO PACK: 10.0000 g | PACK | ORAL | 2 refills | Status: DC

## 2024-03-26 MED ORDER — TORSEMIDE 20 MG PO TABS: 60.0000 mg | ORAL_TABLET | Freq: Every day | ORAL | Status: DC

## 2024-03-26 NOTE — Telephone Encounter (Addendum)
 Pt aware, agreeable, and verbalized understanding  Labs ordered, med list updated   ----- Message from Peder Bourdon sent at 03/20/2024  2:25 PM EDT ----- With K up to 5.7, give 1 dose of Lokelma 10 g today.  Creatinine is also up.  Stop olmesartan and increase hydralazine  to 50 mg bid.  Hold torsemide  for a day and decrease to 60 mg daily.  BMET 1 week.  Follow BP off olmesartan.

## 2024-03-27 ENCOUNTER — Encounter (HOSPITAL_COMMUNITY)
Admission: RE | Admit: 2024-03-27 | Discharge: 2024-03-27 | Disposition: A | Discharge status: Home / Self Care | Source: Ambulatory Visit | Attending: Cardiology | Admitting: Cardiology

## 2024-03-27 DIAGNOSIS — R0600 Dyspnea, unspecified: Secondary | ICD-10-CM | POA: Diagnosis not present

## 2024-03-27 DIAGNOSIS — I272 Pulmonary hypertension, unspecified: Secondary | ICD-10-CM

## 2024-03-27 NOTE — Progress Notes (Signed)
 Daily Session Note  Patient Details  Name: GORDAN GRELL MRN: [id number] Date of Birth: 08/22/1941 Referring Provider:   Flowsheet Row PULMONARY REHAB OTHER RESP ORIENTATION from 01/23/2024 in [facility] CARDIAC REHABILITATION  Referring Provider Peder Bourdon MD       Encounter Date: 03/27/2024  Check In:  Session Check In - 03/27/24 0915       Check-In   Supervising physician immediately available to respond to emergencies See telemetry face sheet for immediately available MD    Location [facility]    Staff Present Clotilda Danish, BS, Exercise Physiologist;Jessica Zoila Hines, MA, RCEP, CCRP, CCET    Virtual Visit No    Medication changes reported     No    Fall or balance concerns reported    No    Tobacco Cessation No Change    Warm-up and Cool-down Performed on first and last piece of equipment    Resistance Training Performed Yes    VAD Patient? No    PAD/SET Patient? No      Pain Assessment   Currently in Pain? No/denies             Capillary Blood Glucose: No results found for this or any previous visit (from the past 24 hours).    Social History   Tobacco Use  Smoking Status Never   Passive exposure: Never  Smokeless Tobacco Never  Tobacco Comments   Never smoke 05/18/22    Goals Met:  Proper associated with RPD/PD & O2 Sat Independence with exercise equipment Improved SOB with ADL's Using PLB without cueing & demonstrates good technique Exercise tolerated well No report of concerns or symptoms today Strength training completed today  Goals Unmet:  Not Applicable  Comments: Pt able to follow exercise prescription today without complaint.  Will continue to monitor for progression.

## 2024-03-28 DIAGNOSIS — I50813 Acute on chronic right heart failure: Secondary | ICD-10-CM | POA: Diagnosis not present

## 2024-03-30 ENCOUNTER — Telehealth (HOSPITAL_COMMUNITY): Payer: Self-pay

## 2024-03-30 ENCOUNTER — Other Ambulatory Visit: Payer: Self-pay

## 2024-03-30 ENCOUNTER — Encounter (HOSPITAL_COMMUNITY)
Admission: RE | Admit: 2024-03-30 | Discharge: 2024-03-30 | Disposition: A | Discharge status: Home / Self Care | Source: Ambulatory Visit | Attending: Cardiology

## 2024-03-30 DIAGNOSIS — D631 Anemia in chronic kidney disease: Secondary | ICD-10-CM | POA: Insufficient documentation

## 2024-03-30 DIAGNOSIS — I272 Pulmonary hypertension, unspecified: Secondary | ICD-10-CM

## 2024-03-30 DIAGNOSIS — R0600 Dyspnea, unspecified: Secondary | ICD-10-CM | POA: Diagnosis not present

## 2024-03-30 NOTE — Telephone Encounter (Signed)
 Auth Submission: NO AUTH NEEDED Site of care: Site of care: [facility] Payer: [employer] Medication & CPT/J Code(s) submitted: Venofer (Iron Sucrose) J1756 Route of submission (phone, fax, portal): portal Phone # Fax # Auth type: Buy/Bill PB Units/visits requested: 500mg  x 2doses Reference number:  Approval from: 03/30/24 to 10/21/24

## 2024-03-30 NOTE — Progress Notes (Signed)
 Daily Session Note  Patient Details  Name: Robert Baldwin MRN: [id number] Date of Birth: [birth year] Referring Provider:   Flowsheet Row PULMONARY REHAB OTHER RESP ORIENTATION from 01/23/2024 in [facility] CARDIAC REHABILITATION  Referring Provider Peder Bourdon MD       Encounter Date: 03/30/2024  Check In:  Session Check In - 03/30/24 0915       Check-In   Supervising physician immediately available to respond to emergencies See telemetry face sheet for immediately available MD    Location [facility]    Staff Present Jerrol Morelle, BSN, RN, Aggie Horton, MA, RCEP, CCRP, CCET    Virtual Visit No    Medication changes reported     No    Fall or balance concerns reported    No    Tobacco Cessation No Change    Warm-up and Cool-down Performed on first and last piece of equipment    Resistance Training Performed Yes    VAD Patient? No    PAD/SET Patient? No      Pain Assessment   Currently in Pain? No/denies             Capillary Blood Glucose: No results found for this or any previous visit (from the past 24 hours).    Social History   Tobacco Use  Smoking Status Never   Passive exposure: Never  Smokeless Tobacco Never  Tobacco Comments   Never smoke 05/18/22    Goals Met:  Proper associated with RPD/PD & O2 Sat Independence with exercise equipment Improved SOB with ADL's Using PLB without cueing & demonstrates good technique Exercise tolerated well No report of concerns or symptoms today Strength training completed today  Goals Unmet:  Not Applicable  Comments: Pt able to follow exercise prescription today without complaint.  Will continue to monitor for progression.

## 2024-04-01 ENCOUNTER — Encounter (HOSPITAL_COMMUNITY)
Admission: RE | Admit: 2024-04-01 | Discharge: 2024-04-01 | Disposition: A | Discharge status: Home / Self Care | Source: Ambulatory Visit | Attending: Cardiology | Admitting: Cardiology

## 2024-04-01 DIAGNOSIS — R0600 Dyspnea, unspecified: Secondary | ICD-10-CM | POA: Diagnosis not present

## 2024-04-01 DIAGNOSIS — I272 Pulmonary hypertension, unspecified: Secondary | ICD-10-CM

## 2024-04-01 NOTE — Progress Notes (Signed)
 Daily Session Note  Patient Details  Name: Robert Baldwin MRN: [id number] Date of Birth: [birth year] Referring Provider:   Flowsheet Row PULMONARY REHAB OTHER RESP ORIENTATION from 01/23/2024 in [facility] CARDIAC REHABILITATION  Referring Provider Peder Bourdon MD       Encounter Date: 04/01/2024  Check In:  Session Check In - 04/01/24 0915       Check-In   Supervising physician immediately available to respond to emergencies See telemetry face sheet for immediately available MD    Location [facility]    Staff Present Ronna Coho BSN, RN;Heather Toy Freund, BS, Exercise Physiologist;Jessica [place], [place], RCEP, CCRP, CCET    Virtual Visit No    Medication changes reported     No    Fall or balance concerns reported    No    Tobacco Cessation No Change    Warm-up and Cool-down Performed on first and last piece of equipment    Resistance Training Performed Yes    VAD Patient? No    PAD/SET Patient? No      Pain Assessment   Currently in Pain? No/denies             Capillary Blood Glucose: No results found for this or any previous visit (from the past 24 hours).    Social History   Tobacco Use  Smoking Status Never   Passive exposure: Never  Smokeless Tobacco Never  Tobacco Comments   Never smoke 05/18/22    Goals Met:  Proper associated with RPD/PD & O2 Sat Independence with exercise equipment Improved SOB with ADL's Using PLB without cueing & demonstrates good technique Exercise tolerated well No report of concerns or symptoms today Strength training completed today  Goals Unmet:  Not Applicable  Comments: Pt able to follow exercise prescription today without complaint.  Will continue to monitor for progression.

## 2024-04-03 ENCOUNTER — Encounter (HOSPITAL_COMMUNITY)
Admission: RE | Admit: 2024-04-03 | Discharge: 2024-04-03 | Disposition: A | Discharge status: Home / Self Care | Source: Ambulatory Visit | Attending: Cardiology

## 2024-04-03 DIAGNOSIS — I5032 Chronic diastolic (congestive) heart failure: Secondary | ICD-10-CM | POA: Diagnosis not present

## 2024-04-03 DIAGNOSIS — R0600 Dyspnea, unspecified: Secondary | ICD-10-CM | POA: Diagnosis not present

## 2024-04-03 DIAGNOSIS — I272 Pulmonary hypertension, unspecified: Secondary | ICD-10-CM | POA: Diagnosis not present

## 2024-04-03 NOTE — Progress Notes (Signed)
 Daily Session Note  Patient Details  Name: Robert Baldwin MRN: [id number] Date of Birth: [birth year] Referring Provider:   Flowsheet Row PULMONARY REHAB OTHER RESP ORIENTATION from 01/23/2024 in [facility] CARDIAC REHABILITATION  Referring Provider Peder Bourdon MD    Encounter Date: 04/03/2024  Check In:  Session Check In - 04/03/24 0915       Check-In   Supervising physician immediately available to respond to emergencies See telemetry face sheet for immediately available MD    Location [facility]    Staff Present Clotilda Danish, BS, Exercise Physiologist;Merleen Picazo Annette Barters, BSN, RN, WTA-C    Virtual Visit No    Medication changes reported     No    Fall or balance concerns reported    No    Tobacco Cessation No Change    Warm-up and Cool-down Performed on first and last piece of equipment    Resistance Training Performed Yes    VAD Patient? No    PAD/SET Patient? No      Pain Assessment   Currently in Pain? No/denies          Capillary Blood Glucose: No results found for this or any previous visit (from the past 24 hours).    Social History   Tobacco Use  Smoking Status Never   Passive exposure: Never  Smokeless Tobacco Never  Tobacco Comments   Never smoke 05/18/22    Goals Met:  Proper associated with RPD/PD & O2 Sat Independence with exercise equipment Improved SOB with ADL's Using PLB without cueing & demonstrates good technique Exercise tolerated well No report of concerns or symptoms today Strength training completed today  Goals Unmet:  Not Applicable  Comments: Pt able to follow exercise prescription today without complaint.  Will continue to monitor for progression.

## 2024-04-04 LAB — BASIC METABOLIC PANEL WITH GFR
BUN/Creatinine Ratio: 34 — ABNORMAL HIGH (ref 10–24)
BUN: 74 mg/dL — ABNORMAL HIGH (ref 8–27)
CO2: 23 mmol/L (ref 20–29)
Calcium: 9.3 mg/dL (ref 8.6–10.2)
Chloride: 100 mmol/L (ref 96–106)
Creatinine, Ser: 2.15 mg/dL — ABNORMAL HIGH (ref 0.76–1.27)
Glucose: 137 mg/dL — ABNORMAL HIGH (ref 70–99)
Potassium: 4.5 mmol/L (ref 3.5–5.2)
Sodium: 142 mmol/L (ref 134–144)
eGFR: 30 mL/min/{1.73_m2} — ABNORMAL LOW (ref >59)

## 2024-04-06 ENCOUNTER — Encounter (HOSPITAL_COMMUNITY)
Admission: RE | Admit: 2024-04-06 | Discharge: 2024-04-06 | Disposition: A | Discharge status: Home / Self Care | Source: Ambulatory Visit | Attending: Cardiology

## 2024-04-06 DIAGNOSIS — I272 Pulmonary hypertension, unspecified: Secondary | ICD-10-CM | POA: Diagnosis not present

## 2024-04-06 DIAGNOSIS — R0600 Dyspnea, unspecified: Secondary | ICD-10-CM | POA: Diagnosis not present

## 2024-04-06 NOTE — Progress Notes (Signed)
 Daily Session Note  Patient Details  Name: Robert Baldwin MRN: [id number] Date of Birth: November 20, 1940 Referring Provider:   Flowsheet Row PULMONARY REHAB OTHER RESP ORIENTATION from 01/23/2024 in [facility] CARDIAC REHABILITATION  Referring Provider Peder Bourdon MD    Encounter Date: 04/06/2024  Check In:  Session Check In - 04/06/24 0915       Check-In   Supervising physician immediately available to respond to emergencies See telemetry face sheet for immediately available MD    Location [facility]    Staff Present Clotilda Danish, BS, Exercise Physiologist;Brittany Annette Barters, BSN, RN, Berta Brittle, RN    Virtual Visit No    Medication changes reported     No    Fall or balance concerns reported    No    Tobacco Cessation No Change    Warm-up and Cool-down Performed on first and last piece of equipment    Resistance Training Performed Yes    VAD Patient? No    PAD/SET Patient? No      Pain Assessment   Currently in Pain? No/denies    Multiple Pain Sites No          Capillary Blood Glucose: No results found for this or any previous visit (from the past 24 hours).    Social History   Tobacco Use  Smoking Status Never   Passive exposure: Never  Smokeless Tobacco Never  Tobacco Comments   Never smoke 05/18/22    Goals Met:  Independence with exercise equipment Exercise tolerated well No report of concerns or symptoms today Strength training completed today  Goals Unmet:  Not Applicable  Comments: Pt able to follow exercise prescription today without complaint.  Will continue to monitor for progression.

## 2024-04-07 ENCOUNTER — Ambulatory Visit (HOSPITAL_COMMUNITY): Payer: Self-pay | Admitting: Cardiology

## 2024-04-08 ENCOUNTER — Encounter (HOSPITAL_COMMUNITY)
Admission: RE | Admit: 2024-04-08 | Discharge: 2024-04-08 | Disposition: A | Discharge status: Home / Self Care | Source: Ambulatory Visit | Attending: Cardiology

## 2024-04-08 DIAGNOSIS — I272 Pulmonary hypertension, unspecified: Secondary | ICD-10-CM

## 2024-04-08 DIAGNOSIS — R0600 Dyspnea, unspecified: Secondary | ICD-10-CM | POA: Diagnosis not present

## 2024-04-08 NOTE — Progress Notes (Signed)
 Pulmonary Individual Treatment Plan  Patient Details  Name: Robert Baldwin MRN: [id number] Date of Birth: [birth year] Referring Provider:   Flowsheet Row PULMONARY REHAB OTHER RESP ORIENTATION from 01/23/2024 in [facility] CARDIAC REHABILITATION  Referring Provider Peder Bourdon MD    Initial Encounter Date:  Flowsheet Row PULMONARY REHAB OTHER RESP ORIENTATION from 01/23/2024 in [place] CARDIAC REHABILITATION  Date 01/23/24    Visit Diagnosis: Pulmonary hypertension (HCC)  Patient's Home Medications on Admission:   Current Outpatient Medications:    atorvastatin  (LIPITOR) 40 MG tablet, Take 40 mg by mouth at bedtime., Disp: , Rfl:    cholecalciferol  (VITAMIN D ) 25 MCG (1000 UNIT) tablet, Take 1,000 Units by mouth daily., Disp: , Rfl:    FARXIGA  10 MG TABS tablet, TAKE 1 TABLET BY MOUTH DAILY, Disp: 30 tablet, Rfl: 4   fish oil-omega-3 fatty acids  1000 MG capsule, Take 1 g by mouth every evening., Disp: , Rfl:    hydrALAZINE  (APRESOLINE ) 25 MG tablet, Take 2 tablets (50 mg total) by mouth 3 (three) times daily., Disp: , Rfl:    macitentan  (OPSUMIT ) 10 MG tablet, TAKE 1 TABLET BY MOUTH DAILY, Disp: 90 tablet, Rfl: 3   Misc Natural Products (GLUCOS-CHONDROIT-MSM COMPLEX) TABS, Take 2 tablets by mouth daily., Disp: , Rfl:    Multiple Vitamins-Minerals (MULTIVITAMIN WITH MINERALS) tablet, Take 1 tablet by mouth in the morning.  Centrum Silver, Disp: , Rfl:    niacin  500 MG tablet, Take 500 mg by mouth in the morning., Disp: , Rfl:    potassium chloride  (KLOR-CON  M) 10 MEQ tablet, Take 10 mEq by mouth daily., Disp: , Rfl:    sodium zirconium cyclosilicate  (LOKELMA ) 10 g PACK packet, Take 10 g by mouth as directed., Disp: 11 packet, Rfl: 2   tadalafil  (CIALIS ) 20 MG tablet, TAKE 2 TABLETS BY MOUTH DAILY, Disp: 180 tablet, Rfl: 3   torsemide  (DEMADEX ) 20 MG tablet, Take 3 tablets (60 mg total) by mouth daily., Disp: , Rfl:    warfarin (COUMADIN ) 5 MG tablet, Take 5-7.5 mg by mouth See admin  instructions. Take 5 mg on Saturday,Sunday Tues, Wed, Thursday, and then  Take 7.5 mg on Monday,  Wednesday, and Friday, Disp: , Rfl:   Past Medical History: Past Medical History:  Diagnosis Date   (HFpEF) heart failure with preserved ejection fraction (HCC)    Arthritis    Atrial fibrillation (HCC)    Bradycardia    Carotid artery disease (HCC)    Hyperlipidemia    Hypertension    Pulmonary hypertension (HCC)    Type 2 diabetes mellitus (HCC)     Tobacco Use: Social History   Tobacco Use  Smoking Status Never   Passive exposure: Never  Smokeless Tobacco Never  Tobacco Comments   Never smoke 05/18/22    Labs: Review Flowsheet  More data exists      Latest Ref Rng & Units 05/14/2023 05/21/2023 11/13/2023 11/14/2023 12/18/2023  Labs for ITP Cardiac and Pulmonary Rehab  Cholestrol 0 - 200 mg/dL 98  - - - 409   LDL (calc) 0 - 99 mg/dL 47  - - - 50   HDL-C >81 mg/dL 42  - - - 59   Trlycerides <150 mg/dL 44  - - - 45   Hemoglobin A1c 4.8 - 5.6 % - - - 7.4  -  PH, Arterial 7.35 - 7.45 - 7.402  7.415  - - -  PCO2 arterial 32 - 48 mmHg - 41.4  40.9  - - -  Bicarbonate 20.0 - 28.0 mmol/L - 25.7  26.2  31.9  - -  TCO2 22 - 32 mmol/L - 27  27  29   - -  O2 Saturation % - 73  75  86.4  - -    Details       Multiple values from one day are sorted in reverse-chronological order         Capillary Blood Glucose: Lab Results  Component Value Date   GLUCAP 124 (H) 02/05/2024   GLUCAP 156 (H) 02/05/2024   GLUCAP 155 (H) 02/03/2024   GLUCAP 130 (H) 01/29/2024   GLUCAP 138 (H) 01/29/2024     Pulmonary Assessment Scores:  Pulmonary Assessment Scores     Row Name 01/23/24 1520         ADL UCSD   ADL Phase Entry     SOB Score total 23     Rest 0     Walk 3     Stairs 3     Bath 2     Dress 2     Shop 0       CAT Score   CAT Score 10       mMRC Score   mMRC Score 2       UCSD: Self-administered rating of dyspnea associated with activities of daily living  (ADLs) 6-point scale (0 = not at all to 5 = maximal or unable to do because of breathlessness)  Scoring Scores range from 0 to 120.  Minimally important difference is 5 units  CAT: CAT can identify the health impairment of COPD patients and is better correlated with disease progression.  CAT has a scoring range of zero to 40. The CAT score is classified into four groups of low (less than 10), medium (10 - 20), high (21-30) and very high (31-40) based on the impact level of disease on health status. A CAT score over 10 suggests significant symptoms.  A worsening CAT score could be explained by an exacerbation, poor medication adherence, poor inhaler technique, or progression of COPD or comorbid conditions.  CAT MCID is 2 points  mMRC: mMRC (Modified Medical Research Council) Dyspnea Scale is used to assess the degree of baseline functional disability in patients of respiratory disease due to dyspnea. No minimal important difference is established. A decrease in score of 1 point or greater is considered a positive change.   Pulmonary Function Assessment:   Exercise Target Goals: Exercise Program Goal: Individual exercise prescription set using results from initial 6 min walk test and THRR while considering  patient's activity barriers and safety.   Exercise Prescription Goal: Initial exercise prescription builds to 30-45 minutes a day of aerobic activity, 2-3 days per week.  Home exercise guidelines will be given to patient during program as part of exercise prescription that the participant will acknowledge.  Activity Barriers & Risk Stratification:  Activity Barriers & Cardiac Risk Stratification - 01/14/24 1318       Activity Barriers & Cardiac Risk Stratification   Activity Barriers Deconditioning;Assistive Device          6 Minute Walk:  6 Minute Walk     Row Name 01/23/24 1445         6 Minute Walk   Phase Initial     Distance 860 feet     Walk Time 6 minutes      # of Rest Breaks 0     MPH 1.63     METS 1.29  RPE 12     Perceived Dyspnea  2     VO2 Peak 4.52     Symptoms No     Resting HR 73 bpm     Resting BP 180/80     Resting Oxygen  Saturation  90 %     Exercise Oxygen  Saturation  during 6 min walk 85 %     Max Ex. HR 78 bpm     Max Ex. BP 164/70     2 Minute Post BP 148/70       Interval HR   1 Minute HR 66     2 Minute HR 68     3 Minute HR 70     4 Minute HR 71     5 Minute HR 76     6 Minute HR 78     2 Minute Post HR 53     Interval Heart Rate? Yes       Interval Oxygen    Interval Oxygen ? Yes     Baseline Oxygen  Saturation % 90 %     1 Minute Oxygen  Saturation % 86 %     1 Minute Liters of Oxygen  0 L     2 Minute Oxygen  Saturation % 86 %     2 Minute Liters of Oxygen  0 L     3 Minute Oxygen  Saturation % 86 %     3 Minute Liters of Oxygen  0 L     4 Minute Oxygen  Saturation % 86 %     4 Minute Liters of Oxygen  0 L     5 Minute Oxygen  Saturation % 85 %     5 Minute Liters of Oxygen  0 L     6 Minute Oxygen  Saturation % 86 %     6 Minute Liters of Oxygen  0 L     2 Minute Post Oxygen  Saturation % 91 %     2 Minute Post Liters of Oxygen  0 L        Oxygen  Initial Assessment:  Oxygen  Initial Assessment - 01/14/24 1312       Home Oxygen    Home Oxygen  Device None    Sleep Oxygen  Prescription None    Home Exercise Oxygen  Prescription None    Home Resting Oxygen  Prescription None    Compliance with Home Oxygen  Use Yes      Intervention   Short Term Goals To learn and exhibit compliance with exercise, home and travel O2 prescription;To learn and understand importance of monitoring SPO2 with pulse oximeter and demonstrate accurate use of the pulse oximeter.;To learn and understand importance of maintaining oxygen  saturations>88%;To learn and demonstrate proper pursed lip breathing techniques or other breathing techniques. ;To learn and demonstrate proper use of respiratory medications    Long  Term Goals Exhibits  compliance with exercise, home  and travel O2 prescription;Maintenance of O2 saturations>88%;Compliance with respiratory medication;Demonstrates proper use of MDI's;Exhibits proper breathing techniques, such as pursed lip breathing or other method taught during program session;Verbalizes importance of monitoring SPO2 with pulse oximeter and return demonstration          Oxygen  Re-Evaluation:  Oxygen  Re-Evaluation     Row Name 01/27/24 0924 02/28/24 0934           Program Oxygen  Prescription   Program Oxygen  Prescription None None        Home Oxygen    Home Oxygen  Device None None      Sleep Oxygen  Prescription None None      Home  Exercise Oxygen  Prescription None None      Home Resting Oxygen  Prescription None None      Compliance with Home Oxygen  Use Yes Yes        Goals/Expected Outcomes   Short Term Goals To learn and exhibit compliance with exercise, home and travel O2 prescription;To learn and understand importance of monitoring SPO2 with pulse oximeter and demonstrate accurate use of the pulse oximeter.;To learn and understand importance of maintaining oxygen  saturations>88%;To learn and demonstrate proper pursed lip breathing techniques or other breathing techniques. ;To learn and demonstrate proper use of respiratory medications To learn and exhibit compliance with exercise, home and travel O2 prescription;To learn and understand importance of monitoring SPO2 with pulse oximeter and demonstrate accurate use of the pulse oximeter.;To learn and understand importance of maintaining oxygen  saturations>88%;To learn and demonstrate proper pursed lip breathing techniques or other breathing techniques. ;To learn and demonstrate proper use of respiratory medications      Long  Term Goals Exhibits compliance with exercise, home  and travel O2 prescription;Maintenance of O2 saturations>88%;Compliance with respiratory medication;Demonstrates proper use of MDI's;Exhibits proper breathing  techniques, such as pursed lip breathing or other method taught during program session;Verbalizes importance of monitoring SPO2 with pulse oximeter and return demonstration Exhibits compliance with exercise, home  and travel O2 prescription;Maintenance of O2 saturations>88%;Compliance with respiratory medication;Demonstrates proper use of MDI's;Exhibits proper breathing techniques, such as pursed lip breathing or other method taught during program session;Verbalizes importance of monitoring SPO2 with pulse oximeter and return demonstration      Comments Reviewed PLB technique with pt.  Talked about how it works and it's importance in maintaining their exercise saturations. Reviewed PLB technique with pt.  Talked about how it works and it's importance in maintaining their exercise saturations.      Goals/Expected Outcomes Short: Become more profiecient at using PLB.   Long: Become independent at using PLB. Short: Become more profiecient at using PLB.   Long: Become independent at using PLB.         Oxygen  Discharge (Final Oxygen  Re-Evaluation):  Oxygen  Re-Evaluation - 02/28/24 0934       Program Oxygen  Prescription   Program Oxygen  Prescription None      Home Oxygen    Home Oxygen  Device None    Sleep Oxygen  Prescription None    Home Exercise Oxygen  Prescription None    Home Resting Oxygen  Prescription None    Compliance with Home Oxygen  Use Yes      Goals/Expected Outcomes   Short Term Goals To learn and exhibit compliance with exercise, home and travel O2 prescription;To learn and understand importance of monitoring SPO2 with pulse oximeter and demonstrate accurate use of the pulse oximeter.;To learn and understand importance of maintaining oxygen  saturations>88%;To learn and demonstrate proper pursed lip breathing techniques or other breathing techniques. ;To learn and demonstrate proper use of respiratory medications    Long  Term Goals Exhibits compliance with exercise, home  and travel O2  prescription;Maintenance of O2 saturations>88%;Compliance with respiratory medication;Demonstrates proper use of MDI's;Exhibits proper breathing techniques, such as pursed lip breathing or other method taught during program session;Verbalizes importance of monitoring SPO2 with pulse oximeter and return demonstration    Comments Reviewed PLB technique with pt.  Talked about how it works and it's importance in maintaining their exercise saturations.    Goals/Expected Outcomes Short: Become more profiecient at using PLB.   Long: Become independent at using PLB.          Initial Exercise Prescription:  Initial Exercise Prescription - 01/23/24 1400       Date of Initial Exercise RX and Referring Provider   Date 01/23/24    Referring Provider Peder Bourdon MD      Oxygen    Maintain Oxygen  Saturation 88% or higher      NuStep   Level 2    SPM 60    Minutes 15    METs 1.9      Track   Laps 15    Minutes 15    METs 1.9      Prescription Details   Frequency (times per week) 2    Duration Progress to 30 minutes of continuous aerobic without signs/symptoms of physical distress      Intensity   THRR 40-80% of Max Heartrate 81-119    Ratings of Perceived Exertion 11-13    Perceived Dyspnea 0-4      Resistance Training   Training Prescription Yes    Weight 3    Reps 10-15          Perform Capillary Blood Glucose checks as needed.  Exercise Prescription Changes:   Exercise Prescription Changes     Row Name 01/23/24 1400 03/18/24 1200 03/18/24 1300 03/30/24 1500       Response to Exercise   Blood Pressure (Admit) 180/80 -- 124/62 126/58    Blood Pressure (Exercise) 164/70 -- -- --    Blood Pressure (Exit) 148/70 -- 132/64 120/66    Heart Rate (Admit) 43 bpm -- 44 bpm 45 bpm    Heart Rate (Exercise) 78 bpm -- 63 bpm 71 bpm    Heart Rate (Exit) 53 bpm -- 58 bpm 51 bpm    Oxygen  Saturation (Admit) 90 % -- 94 % 95 %    Oxygen  Saturation (Exercise) 80 % -- 92 % 90 %     Oxygen  Saturation (Exit) 91 % -- 94 % 94 %    Rating of Perceived Exertion (Exercise) 12 -- 12 12    Perceived Dyspnea (Exercise) 2 -- 2 1    Duration Continue with 30 min of aerobic exercise without signs/symptoms of physical distress. -- Continue with 30 min of aerobic exercise without signs/symptoms of physical distress. Continue with 30 min of aerobic exercise without signs/symptoms of physical distress.    Intensity THRR unchanged -- THRR unchanged THRR unchanged      Progression   Progression Continue to progress workloads to maintain intensity without signs/symptoms of physical distress. -- Continue to progress workloads to maintain intensity without signs/symptoms of physical distress. Continue to progress workloads to maintain intensity without signs/symptoms of physical distress.      Resistance Training   Training Prescription -- -- Yes Yes    Weight -- -- 3 3    Reps -- -- 10-15 10-15      NuStep   Level -- -- 2 2    SPM -- -- 97 93    Minutes -- -- 25 25    METs -- -- 2 2      Track   Laps -- -- 8 9    Minutes -- -- 5 5    METs -- -- 1 1      Home Exercise Plan   Plans to continue exercise at -- Home (comment) Home (comment) Home (comment)    Frequency -- Add 2 additional days to program exercise sessions. Add 2 additional days to program exercise sessions. Add 2 additional days to program exercise sessions.    Initial Home  Exercises Provided -- 03/18/24 -- --       Exercise Comments:   Exercise Comments     Row Name 01/27/24 [phone]           Exercise Comments First full day of exercise!  Patient was oriented to gym and equipment including functions, settings, policies, and procedures.  Patient's individual exercise prescription and treatment plan were reviewed.  All starting workloads were established based on the results of the 6 minute walk test done at initial orientation visit.  The plan for exercise progression was also introduced and progression will be  customized based on patient's performance and goals.          Exercise Goals and Review:   Exercise Goals     Row Name 01/14/24 1333             Exercise Goals   Increase Physical Activity Yes       Intervention Provide advice, education, support and counseling about physical activity/exercise needs.;Develop an individualized exercise prescription for aerobic and resistive training based on initial evaluation findings, risk stratification, comorbidities and participant's personal goals.       Expected Outcomes Short Term: Attend rehab on a regular basis to increase amount of physical activity.;Long Term: Add in home exercise to make exercise part of routine and to increase amount of physical activity.;Long Term: Exercising regularly at least 3-5 days a week.       Increase Strength and Stamina Yes       Intervention Provide advice, education, support and counseling about physical activity/exercise needs.;Develop an individualized exercise prescription for aerobic and resistive training based on initial evaluation findings, risk stratification, comorbidities and participant's personal goals.       Expected Outcomes Short Term: Increase workloads from initial exercise prescription for resistance, speed, and METs.;Short Term: Perform resistance training exercises routinely during rehab and add in resistance training at home;Long Term: Improve cardiorespiratory fitness, muscular endurance and strength as measured by increased METs and functional capacity ( )       Able to understand and use rate of perceived exertion (RPE) scale Yes       Intervention Provide education and explanation on how to use RPE scale       Expected Outcomes Short Term: Able to use RPE daily in rehab to express subjective intensity level;Long Term:  Able to use RPE to guide intensity level when exercising independently       Able to understand and use Dyspnea scale Yes       Intervention Provide education and  explanation on how to use Dyspnea scale       Expected Outcomes Short Term: Able to use Dyspnea scale daily in rehab to express subjective sense of shortness of breath during exertion;Long Term: Able to use Dyspnea scale to guide intensity level when exercising independently       Knowledge and understanding of Target Heart Rate Range (THRR) Yes       Intervention Provide education and explanation of THRR including how the numbers were predicted and where they are located for reference       Expected Outcomes Short Term: Able to use daily as guideline for intensity in rehab;Long Term: Able to use THRR to govern intensity when exercising independently;Short Term: Able to state/look up THRR       Able to check pulse independently Yes       Intervention Provide education and demonstration on how to check pulse in carotid and radial arteries.;Review the importance  of being able to check your own pulse for safety during independent exercise       Expected Outcomes Short Term: Able to explain why pulse checking is important during independent exercise;Long Term: Able to check pulse independently and accurately       Understanding of Exercise Prescription Yes       Intervention Provide education, explanation, and written materials on patient's individual exercise prescription       Expected Outcomes Short Term: Able to explain program exercise prescription;Long Term: Able to explain home exercise prescription to exercise independently          Exercise Goals Re-Evaluation :  Exercise Goals Re-Evaluation     Row Name 01/27/24 1610 02/28/24 0934 03/18/24 1142         Exercise Goal Re-Evaluation   Exercise Goals Review Able to understand and use Dyspnea scale;Understanding of Exercise Prescription;Able to understand and use rate of perceived exertion (RPE) scale;Knowledge and understanding of Target Heart Rate Range (THRR) Increase Strength and Stamina;Able to understand and use Dyspnea scale;Able to  check pulse independently;Able to understand and use rate of perceived exertion (RPE) scale;Increase Physical Activity Increase Physical Activity;Increase Strength and Stamina;Understanding of Exercise Prescription     Comments Reviewed RPE and dyspnea scale, THR and program prescription with pt today.  Pt voiced understanding and was given a copy of goals to take home. Robert Baldwin states he has a machine at home that he uses that onyl works his legs that he uses 2 times a day for 10 mins at a time at a low speed. He wishes he could walk some more outside but states that his knees bother him when he walks for a while. Robert Baldwin is doing well in rehab. He stated that it is getting a little eaiser to do the nustep at level 2. It is tougher foe him when he walks the track but is increasing his laps. He stated that he does feel like his endurance has improved some. He talked about coming to [facility] when he is done with hte program     Expected Outcomes Short: Use RPE daily to regulate intensity.  Long: Follow program prescription in THR. Short: Continue to attend rehab. Long: Strengthen knees and legs so he can enjoy walking more. Short: increase levle on the nustep to 3   long: continue to come to rehab        Discharge Exercise Prescription (Final Exercise Prescription Changes):  Exercise Prescription Changes - 03/30/24 1500       Response to Exercise   Blood Pressure (Admit) 126/58    Blood Pressure (Exit) 120/66    Heart Rate (Admit) 45 bpm    Heart Rate (Exercise) 71 bpm    Heart Rate (Exit) 51 bpm    Oxygen  Saturation (Admit) 95 %    Oxygen  Saturation (Exercise) 90 %    Oxygen  Saturation (Exit) 94 %    Rating of Perceived Exertion (Exercise) 12    Perceived Dyspnea (Exercise) 1    Duration Continue with 30 min of aerobic exercise without signs/symptoms of physical distress.    Intensity THRR unchanged      Progression   Progression Continue to progress workloads to maintain intensity without  signs/symptoms of physical distress.      Resistance Training   Training Prescription Yes    Weight 3    Reps 10-15      NuStep   Level 2    SPM 93    Minutes 25  METs 2      Track   Laps 9    Minutes 5    METs 1      Home Exercise Plan   Plans to continue exercise at Home (comment)    Frequency Add 2 additional days to program exercise sessions.          Nutrition:  Target Goals: Understanding of nutrition guidelines, daily intake of sodium 1500mg , cholesterol 200mg , calories 30% from fat and 7% or less from saturated fats, daily to have 5 or more servings of fruits and vegetables.  Biometrics:  Pre Biometrics - 01/23/24 1450       Pre Biometrics   Height 5' 4 (1.626 m)    Weight 78 kg    Waist Circumference 40 inches    Hip Circumference 41 inches    Waist to Hip Ratio 0.98 %    BMI (Calculated) 29.5    Grip Strength 10.4 kg           Nutrition Therapy Plan and Nutrition Goals:   Nutrition Assessments:  MEDIFICTS Score Key: >=70 Need to make dietary changes  40-70 Heart Healthy Diet <= 40 Therapeutic Level Cholesterol Diet  Flowsheet Row PULMONARY REHAB OTHER RESP ORIENTATION from 01/23/2024 in [facility] CARDIAC REHABILITATION  Picture Your Plate Total Score on Admission 40   Picture Your Plate Scores: <78 Unhealthy dietary pattern with much room for improvement. 41-50 Dietary pattern unlikely to meet recommendations for good health and room for improvement. 51-60 More healthful dietary pattern, with some room for improvement.  >60 Healthy dietary pattern, although there may be some specific behaviors that could be improved.    Nutrition Goals Re-Evaluation:  Nutrition Goals Re-Evaluation     Row Name 02/28/24 0929 03/18/24 1147           Goals   Current Weight 171 lb 14.4 oz (78 kg) --      Nutrition Goal Continue to eat healthy. Continue to eat healthy.      Comment Robert Baldwin states that he is eating very healthy. States that he has quit  going out to eat and his wife is making all their meals at home. States the grease in restaurants was making him sick. He is drinking enough water as well. Robert Baldwin stated thatt he is trying to stick to a healhty diet. He eatsmostly fish ( salmon and white fish), he eats chicken but sais that he gets tired of it faster. He does not eat a lot of beef and likes to sub for ground [place] and [place] bacon. He drinks mostly water and tea and also does ginger ale sometimes. He is not much of a sweets person but does like milkshakes (vanilla).      Expected Outcome Short: Continue to attend rehab. Long: Continue to follow a healthy diet Short: watch portion control long: continue to follow healhty diet         Nutrition Goals Discharge (Final Nutrition Goals Re-Evaluation):  Nutrition Goals Re-Evaluation - 03/18/24 1147       Goals   Nutrition Goal Continue to eat healthy.    Comment Robert Baldwin stated thatt he is trying to stick to a healhty diet. He eatsmostly fish ( salmon and white fish), he eats chicken but sais that he gets tired of it faster. He does not eat a lot of beef and likes to sub for ground [place] and [place] bacon. He drinks mostly water and tea and also does ginger ale sometimes. He is not much  of a sweets person but does like milkshakes (vanilla).    Expected Outcome Short: watch portion control long: continue to follow healhty diet          Psychosocial: Target Goals: Acknowledge presence or absence of significant depression and/or stress, maximize coping skills, provide positive support system. Participant is able to verbalize types and ability to use techniques and skills needed for reducing stress and depression.  Initial Review & Psychosocial Screening:  Initial Psych Review & Screening - 01/14/24 1305       Initial Review   Current issues with None Identified      Family Dynamics   Good Support System? Yes   Wife lives with him.     Barriers   Psychosocial barriers to participate  in program There are no identifiable barriers or psychosocial needs.      Screening Interventions   Interventions Encouraged to exercise    Expected Outcomes Short Term goal: Utilizing psychosocial counselor, staff and physician to assist with identification of specific Stressors or current issues interfering with healing process. Setting desired goal for each stressor or current issue identified.;Long Term Goal: Stressors or current issues are controlled or eliminated.;Short Term goal: Identification and review with participant of any Quality of Life or Depression concerns found by scoring the questionnaire.;Long Term goal: The participant improves quality of Life and PHQ9 Scores as seen by post scores and/or verbalization of changes          Quality of Life Scores:  Scores of 19 and below usually indicate a poorer quality of life in these areas.  A difference of  2-3 points is a clinically meaningful difference.  A difference of 2-3 points in the total score of the Quality of Life Index has been associated with significant improvement in overall quality of life, self-image, physical symptoms, and general health in studies assessing change in quality of life.   PHQ-9: Review Flowsheet       01/23/2024  Depression screen PHQ 2/9  Decreased Interest 0  Down, Depressed, Hopeless 0  PHQ - 2 Score 0  Altered sleeping 0  Tired, decreased energy 0  Change in appetite 0  Feeling bad or failure about yourself  0  Trouble concentrating 0  Moving slowly or fidgety/restless 0  Suicidal thoughts 0  PHQ-9 Score 0   Interpretation of Total Score  Total Score Depression Severity:  1-4 = Minimal depression, 5-9 = Mild depression, 10-14 = Moderate depression, 15-19 = Moderately severe depression, 20-27 = Severe depression   Psychosocial Evaluation and Intervention:  Psychosocial Evaluation - 01/14/24 1308       Psychosocial Evaluation & Interventions   Interventions Relaxation  education;Encouraged to exercise with the program and follow exercise prescription    Comments Robert Baldwin is a pleasant gentleman who is coming to rehab for a diagnosis of pulmonary HTN. He was recently hospitalized on 1/25 with PNA where he got discharged with O2, but hasn't used it in the last little while as he states he doesn't need it. Tallen uses a cane for ambulation, which he says he doesn't need it but because his wife wants him to use it for safety reasons. He lives at home with his wife and dog who are his support system. 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 He denies any current stressors in life. His current exercise consists of what the home PT tells him to do, which he says he has 2 more sessions  of that left. Robert Baldwin is eager to come exercise to get his strenth back.    Expected Outcomes Short: Increase strength and stamina. Long: Be able to walk without a cane.    Continue Psychosocial Services  Follow up required by staff          Psychosocial Re-Evaluation:  Psychosocial Re-Evaluation     Row Name 02/28/24 0927 03/18/24 1146           Psychosocial Re-Evaluation   Current issues with None Identified None Identified      Comments Robert Baldwin is doing well in rehab. He states he does not have any stressors going on in his life and he enjoys coming here. His wife is his biggest support system and stays on him about his health. Robert Baldwin is doing well in rehab. He stated that he does not have much stress and just lets everything roll off. He likes coming to rehab and enjoys the social aspect as well.      Expected Outcomes Short: Continue to attend rehab. Long: Continue to have stress free techniques. Short: Continue to attend rehab. Long: Continue to have stress free techniques.      Interventions Encouraged to attend Pulmonary Rehabilitation for the exercise Encouraged to attend Pulmonary Rehabilitation for the exercise      Continue Psychosocial  Services  Follow up required by staff Follow up required by staff         Psychosocial Discharge (Final Psychosocial Re-Evaluation):  Psychosocial Re-Evaluation - 03/18/24 1146       Psychosocial Re-Evaluation   Current issues with None Identified    Comments Robert Baldwin is doing well in rehab. He stated that he does not have much stress and just lets everything roll off. He likes coming to rehab and enjoys the social aspect as well.    Expected Outcomes Short: Continue to attend rehab. Long: Continue to have stress free techniques.    Interventions Encouraged to attend Pulmonary Rehabilitation for the exercise    Continue Psychosocial Services  Follow up required by staff           Education: Education Goals: Education classes will be provided on a weekly basis, covering required topics. Participant will state understanding/return demonstration of topics presented.  Learning Barriers/Preferences:  Learning Barriers/Preferences - 01/14/24 1308       Learning Barriers/Preferences   Learning Barriers Sight   Wears glasses   Learning Preferences None          Education Topics: How Lungs Work and Diseases: - Discuss the anatomy of the lungs and diseases that can affect the lungs, such as COPD. Flowsheet Row PULMONARY REHAB OTHER RESPIRATORY from 04/01/2024 in [facility]  Date 03/25/24  Educator hb  Instruction Review Code 1- Verbalizes Understanding    Exercise: -Discuss the importance of exercise, FITT principles of exercise, normal and abnormal responses to exercise, and how to exercise safely.   Environmental Irritants: -Discuss types of environmental irritants and how to limit exposure to environmental irritants.   Meds/Inhalers and oxygen : - Discuss respiratory medications, definition of an inhaler and oxygen , and the proper way to use an inhaler and oxygen .   Energy Saving Techniques: - Discuss methods to conserve energy and decrease shortness  of breath when performing activities of daily living.    Bronchial Hygiene / Breathing Techniques: - Discuss breathing mechanics, pursed-lip breathing technique,  proper posture, effective ways to clear airways, and other functional breathing techniques   Cleaning Equipment: - Provides group verbal and  written instruction about the health risks of elevated stress, cause of high stress, and healthy ways to reduce stress.   Nutrition I: Fats: - Discuss the types of cholesterol, what cholesterol does to the body, and how cholesterol levels can be controlled.   Nutrition II: Labels: -Discuss the different components of food labels and how to read food labels.   Respiratory Infections: - Discuss the signs and symptoms of respiratory infections, ways to prevent respiratory infections, and the importance of seeking medical treatment when having a respiratory infection.   Stress I: Signs and Symptoms: - Discuss the causes of stress, how stress may lead to anxiety and depression, and ways to limit stress. Flowsheet Row PULMONARY REHAB OTHER RESPIRATORY from 04/01/2024 in [facility]  Date 03/04/24  Educator [facility]  Instruction Review Code 1- Verbalizes Understanding    Stress II: Relaxation: -Discuss relaxation techniques to limit stress. Flowsheet Row PULMONARY REHAB OTHER RESPIRATORY from 04/01/2024 in [facility]  Date 03/04/24  Educator [facility]  Instruction Review Code 1- Verbalizes Understanding    Oxygen  for Home/Travel: - Discuss how to prepare for travel when on oxygen  and proper ways to transport and store oxygen  to ensure safety.   Knowledge Questionnaire Score:  Knowledge Questionnaire Score - 01/23/24 1520       Knowledge Questionnaire Score   Pre Score 12/18          Core Components/Risk Factors/Patient Goals at Admission:  Personal Goals and Risk Factors at Admission - 01/14/24 1358       Core Components/Risk  Factors/Patient Goals on Admission    Weight Management Yes    Intervention Weight Management: Develop a combined nutrition and exercise program designed to reach desired caloric intake, while maintaining appropriate intake of nutrient and fiber, sodium and fats, and appropriate energy expenditure required for the weight goal.;Weight Management: Provide education and appropriate resources to help participant work on and attain dietary goals.;Weight Management/Obesity: Establish reasonable short term and long term weight goals.;Obesity: Provide education and appropriate resources to help participant work on and attain dietary goals.    Expected Outcomes Short Term: Continue to assess and modify interventions until short term weight is achieved;Long Term: Adherence to nutrition and physical activity/exercise program aimed toward attainment of established weight goal;Weight Maintenance: Understanding of the daily nutrition guidelines, which includes 25-35% calories from fat, 7% or less cal from saturated fats, less than 200mg  cholesterol, less than 1.5gm of sodium, & 5 or more servings of fruits and vegetables daily;Understanding recommendations for meals to include 15-35% energy as protein, 25-35% energy from fat, 35-60% energy from carbohydrates, less than 200mg  of dietary cholesterol, 20-35 gm of total fiber daily    Improve shortness of breath with ADL's Yes    Intervention Provide education, individualized exercise plan and daily activity instruction to help decrease symptoms of SOB with activities of daily living.    Expected Outcomes Short Term: Improve cardiorespiratory fitness to achieve a reduction of symptoms when performing ADLs;Long Term: Be able to perform more ADLs without symptoms or delay the onset of symptoms    Increase knowledge of respiratory medications and ability to use respiratory devices properly  Yes    Intervention Provide education and demonstration as needed of appropriate use of  medications, inhalers, and oxygen  therapy.    Expected Outcomes Short Term: Achieves understanding of medications use. Understands that oxygen  is a medication prescribed by physician. Demonstrates appropriate use of inhaler and oxygen  therapy.;Long Term: Maintain appropriate use of medications, inhalers,  and oxygen  therapy.    Diabetes Yes    Intervention Provide education about proper nutrition, including hydration, and aerobic/resistive exercise prescription along with prescribed medications to achieve blood glucose in normal ranges: Fasting glucose 65-99 mg/dL;Provide education about signs/symptoms and action to take for hypo/hyperglycemia.    Expected Outcomes Short Term: Participant verbalizes understanding of the signs/symptoms and immediate care of hyper/hypoglycemia, proper foot care and importance of medication, aerobic/resistive exercise and nutrition plan for blood glucose control.;Long Term: Attainment of HbA1C < 7%.    Hypertension Yes    Intervention Monitor prescription use compliance.;Provide education on lifestyle modifcations including regular physical activity/exercise, weight management, moderate sodium restriction and increased consumption of fresh fruit, vegetables, and low fat dairy, alcohol  moderation, and smoking cessation.    Expected Outcomes Short Term: Continued assessment and intervention until BP is < 140/77mm HG in hypertensive participants. < 130/74mm HG in hypertensive participants with diabetes, heart failure or chronic kidney disease.;Long Term: Maintenance of blood pressure at goal levels.    Lipids Yes    Intervention Provide education and support for participant on nutrition & aerobic/resistive exercise along with prescribed medications to achieve LDL 70mg , HDL >40mg .    Expected Outcomes Short Term: Participant states understanding of desired cholesterol values and is compliant with medications prescribed. Participant is following exercise prescription and nutrition  guidelines.;Long Term: Cholesterol controlled with medications as prescribed, with individualized exercise RX and with personalized nutrition plan. Value goals: LDL < 70mg , HDL > 40 mg.          Core Components/Risk Factors/Patient Goals Review:   Goals and Risk Factor Review     Row Name 02/28/24 0931 03/18/24 1152           Core Components/Risk Factors/Patient Goals Review   Personal Goals Review Lipids;Diabetes;Improve shortness of breath with ADL's;Hypertension Lipids;Diabetes;Improve shortness of breath with ADL's;Hypertension      Review Robert Baldwin feels that his breathing has improved significantly since starting the program. He states that his wife checks his BP and O2 at home and it runs normal. Also says his blood sugars have been running WNL as well. His wife is his great support system and stays on him about his health. Robert Baldwin is doing great in rehab. He said that his breathing has improved and he is enjoying coming to the program. He does check his blood pressure everyday. He also checks his sugar daily and has been WNL.      Expected Outcomes Short: Continue to use PLB and other breathing techniques to improve breathing. Long: Continue to check vitals at home and report to health care professionals. Short: Continue to use PLB and other breathing techniques to improve breathing. Long: Continue to check vitals at home and report to health care professionals.         Core Components/Risk Factors/Patient Goals at Discharge (Final Review):   Goals and Risk Factor Review - 03/18/24 1152       Core Components/Risk Factors/Patient Goals Review   Personal Goals Review Lipids;Diabetes;Improve shortness of breath with ADL's;Hypertension    Review Robert Baldwin is doing great in rehab. He said that his breathing has improved and he is enjoying coming to the program. He does check his blood pressure everyday. He also checks his sugar daily and has been WNL.    Expected Outcomes Short: Continue to use PLB  and other breathing techniques to improve breathing. Long: Continue to check vitals at home and report to health care professionals.  ITP Comments:  ITP Comments     Row Name 01/14/24 1316 01/24/24 0812 01/27/24 0923 02/12/24 0845 03/11/24 0823   ITP Comments Completed virtual orientation today.  EP evaluation is scheduled for 01/23/24 at 1:30pm .  Documentation for diagnosis can be found in [facility] encounter 12/18/23. Patient arrived for 1st visit/orientation/education at 1315. Patient was referred to PR by Peder Bourdon, MD due to Pulmonary HTN. During orientation advised patient on arrival and appointment times what to wear, what to do before, during and after exercise. Reviewed attendance and class policy.  Pt is scheduled to return Pulmonary Rehab on 01/27/24 at 915. Pt was advised to come to class 15 minutes before class starts.  Discussed RPE/Dpysnea scales. Patient participated in warm up stretches. Patient was able to complete 6 minute walk test.  Patient was measured for the equipment. Discussed equipment safety with patient. Took patient pre-anthropometric measurements. Patient finished visit at 1415. First full day of exercise!  Patient was oriented to gym and equipment including functions, settings, policies, and procedures.  Patient's individual exercise prescription and treatment plan were reviewed.  All starting workloads were established based on the results of the 6 minute walk test done at initial orientation visit.  The plan for exercise progression was also introduced and progression will be customized based on patient's performance and goals. 30 day review completed. ITP sent to Dr.Jehanzeb Memon, Medical Director of  Pulmonary Rehab. Continue with ITP unless changes are made by physician.  Newer to program 30 day review completed. ITP sent to Dr.Jehanzeb Memon, Medical Director of  Pulmonary Rehab. Continue with ITP unless changes are made by physician.    Row Name 04/08/24 0800            ITP Comments 30 day review completed. ITP sent to Dr.Jehanzeb Memon, Medical Director of Pulmonary Rehab. Continue with ITP unless changes are made by physician          Comments: 30 Day Review

## 2024-04-08 NOTE — Progress Notes (Signed)
 Daily Session Note  Patient Details  Name: CARNIE BRUEMMER MRN: [id number] Date of Birth: 09-28-1941 Referring Provider:   Flowsheet Row PULMONARY REHAB OTHER RESP ORIENTATION from 01/23/2024 in [facility]  Referring Provider Peder Bourdon MD    Encounter Date: 04/08/2024  Check In:  Session Check In - 04/08/24 0915       Check-In   Supervising physician immediately available to respond to emergencies See telemetry face sheet for immediately available MD    Location [facility]    Staff Present Clotilda Danish, BS, Exercise Physiologist;Hillary Troutman BSN, RN;Brooke Rollin Clock, RN    Virtual Visit No    Medication changes reported     No    Fall or balance concerns reported    No    Tobacco Cessation No Change    Warm-up and Cool-down Performed on first and last piece of equipment    Resistance Training Performed Yes    VAD Patient? No    PAD/SET Patient? No      Pain Assessment   Currently in Pain? No/denies    Multiple Pain Sites No          Capillary Blood Glucose: No results found for this or any previous visit (from the past 24 hours).    Social History   Tobacco Use  Smoking Status Never   Passive exposure: Never  Smokeless Tobacco Never  Tobacco Comments   Never smoke 05/18/22    Goals Met:  Independence with exercise equipment Exercise tolerated well No report of concerns or symptoms today Strength training completed today  Goals Unmet:  Not Applicable  Comments: Pt able to follow exercise prescription today without complaint.  Will continue to monitor for progression.

## 2024-04-10 ENCOUNTER — Encounter (HOSPITAL_COMMUNITY)
Admission: RE | Admit: 2024-04-10 | Discharge: 2024-04-10 | Disposition: A | Discharge status: Home / Self Care | Source: Ambulatory Visit | Attending: Cardiology | Admitting: Cardiology

## 2024-04-10 DIAGNOSIS — R0600 Dyspnea, unspecified: Secondary | ICD-10-CM | POA: Diagnosis not present

## 2024-04-10 DIAGNOSIS — I272 Pulmonary hypertension, unspecified: Secondary | ICD-10-CM

## 2024-04-10 NOTE — Progress Notes (Signed)
 Daily Session Note  Patient Details  Name: Robert Baldwin MRN: [id number] Date of Birth: June 22, 1941 Referring Provider:   Flowsheet Row PULMONARY REHAB OTHER RESP ORIENTATION from 01/23/2024 in [facility]  Referring Provider Peder Bourdon MD    Encounter Date: 04/10/2024  Check In:  Session Check In - 04/10/24 0910       Check-In   Supervising physician immediately available to respond to emergencies See telemetry face sheet for immediately available MD    Location [facility]    Staff Present Clotilda Danish, BS, Exercise Physiologist;Other;Brittany Annette Barters, BSN, RN, WTA-C    Virtual Visit No    Medication changes reported     No    Fall or balance concerns reported    No    Tobacco Cessation No Change    Warm-up and Cool-down Performed on first and last piece of equipment    Resistance Training Performed Yes    VAD Patient? No    PAD/SET Patient? No      Pain Assessment   Currently in Pain? No/denies    Multiple Pain Sites No          Capillary Blood Glucose: No results found for this or any previous visit (from the past 24 hours).    Social History   Tobacco Use  Smoking Status Never   Passive exposure: Never  Smokeless Tobacco Never  Tobacco Comments   Never smoke 05/18/22    Goals Met:  Independence with exercise equipment Exercise tolerated well No report of concerns or symptoms today Strength training completed today  Goals Unmet:  Not Applicable  Comments: Pt able to follow exercise prescription today without complaint.  Will continue to monitor for progression.

## 2024-04-13 ENCOUNTER — Encounter (HOSPITAL_COMMUNITY)
Admission: RE | Admit: 2024-04-13 | Discharge: 2024-04-13 | Disposition: A | Discharge status: Home / Self Care | Source: Ambulatory Visit | Attending: Cardiology | Admitting: Cardiology

## 2024-04-13 DIAGNOSIS — Z299 Encounter for prophylactic measures, unspecified: Secondary | ICD-10-CM | POA: Diagnosis not present

## 2024-04-13 DIAGNOSIS — I10 Essential (primary) hypertension: Secondary | ICD-10-CM | POA: Diagnosis not present

## 2024-04-13 DIAGNOSIS — I272 Pulmonary hypertension, unspecified: Secondary | ICD-10-CM

## 2024-04-13 DIAGNOSIS — R0600 Dyspnea, unspecified: Secondary | ICD-10-CM | POA: Diagnosis not present

## 2024-04-13 DIAGNOSIS — I4891 Unspecified atrial fibrillation: Secondary | ICD-10-CM | POA: Diagnosis not present

## 2024-04-13 DIAGNOSIS — E1169 Type 2 diabetes mellitus with other specified complication: Secondary | ICD-10-CM | POA: Diagnosis not present

## 2024-04-13 DIAGNOSIS — I5032 Chronic diastolic (congestive) heart failure: Secondary | ICD-10-CM | POA: Diagnosis not present

## 2024-04-13 NOTE — Progress Notes (Signed)
 Daily Session Note  Patient Details  Name: Robert Baldwin MRN: [id number] Date of Birth: 09/22/1941 Referring Provider:   Flowsheet Row PULMONARY REHAB OTHER RESP ORIENTATION from 01/23/2024 in [facility] CARDIAC REH[facility]  Referring Provider Rolan Barrack MD    Encounter Date: 04/13/2024  Check In:  Session Check In - 04/13/24 0915       Check-In   Supervising physician immediately available to respond to emergencies See telemetry face sheet for immediately available MD    Location [facility]    Staff Present Powell Benders, BS, Exercise Physiologist;Deslyn Cavenaugh Jackquline, BSN, RN, Estrella Daring, BS, RRT, CPFT    Virtual Visit No    Medication changes reported     No    Fall or balance concerns reported    No    Tobacco Cessation No Change    Warm-up and Cool-down Performed on first and last piece of equipment    Resistance Training Performed Yes    VAD Patient? No    PAD/SET Patient? No      Pain Assessment   Currently in Pain? No/denies          Capillary Blood Glucose: No results found for this or any previous visit (from the past 24 hours).    Social History   Tobacco Use  Smoking Status Never   Passive exposure: Never  Smokeless Tobacco Never  Tobacco Comments   Never smoke 05/18/22    Goals Met:  Proper associated with RPD/PD & O2 Sat Independence with exercise equipment Improved SOB with ADL's Using PLB without cueing & demonstrates good technique Exercise tolerated well No report of concerns or symptoms today Strength training completed today  Goals Unmet:  Not Applicable  Comments: Pt able to follow exercise prescription today without complaint.  Will continue to monitor for progression.

## 2024-04-13 NOTE — Progress Notes (Unsigned)
 Office Note     CC:  follow up Requesting Provider:  Maree Isles, MD  HPI: Robert Baldwin is a 83 y.o. (1941-09-10) male who presents for surveillance of carotid artery stenosis.  He has history of a left TCAR on 07/02/2022 and right TCAR on 08/20/2022 by Dr. Gretta.  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  He denies any strokelike symptoms since last office visit including slurring speech, change in vision, or one-sided weakness.  He follows regularly with his PCP for management of chronic medical conditions including hypertension, hyperlipidemia, and diabetes.  He is on Coumadin  and statin daily.  Coumadin  for atrial fibrillation..   Past Medical History:  Diagnosis Date   (HFpEF) heart failure with preserved ejection fraction (HCC)    Arthritis    Atrial fibrillation (HCC)    Bradycardia    Carotid artery disease (HCC)    Hyperlipidemia    Hypertension    Pulmonary hypertension (HCC)    Type 2 diabetes mellitus (HCC)     Past Surgical History:  Procedure Laterality Date   Cataract surgery     COLONOSCOPY     HERNIA REPAIR     RIGHT HEART CATH N/A 05/28/2022   Procedure: RIGHT HEART CATH;  Surgeon: Rolan Ezra RAMAN, MD;  Location: [facility] INVASIVE CV LAB;  Service: Cardiovascular;  Laterality: N/A;   RIGHT HEART CATH N/A 05/21/2023   Procedure: RIGHT HEART CATH;  Surgeon: Rolan Ezra RAMAN, MD;  Location: [facility] INVASIVE CV LAB;  Service: Cardiovascular;  Laterality: N/A;   RIGHT/LEFT HEART CATH AND CORONARY ANGIOGRAPHY N/A 04/30/2022   Procedure: RIGHT/LEFT HEART CATH AND CORONARY ANGIOGRAPHY;  Surgeon: Mady Bruckner, MD;  Location: [facility] INVASIVE CV LAB;  Service: Cardiovascular;  Laterality: N/A;   TONSILLECTOMY     TRANSCAROTID ARTERY REVASCULARIZATION  Left 07/02/2022   Procedure: Transcarotid Artery Revascularization;  Surgeon: Gretta Bruckner PARAS, MD;  Location: [facility] OR;  Service: Vascular;  Laterality: Left;    TRANSCAROTID ARTERY REVASCULARIZATION  Right 08/20/2022   Procedure: Right Transcarotid Artery Revascularization;  Surgeon: Gretta Bruckner PARAS, MD;  Location: [facility] OR;  Service: Vascular;  Laterality: Right;   ULTRASOUND GUIDANCE FOR VASCULAR ACCESS Right 07/02/2022   Procedure: ULTRASOUND GUIDANCE FOR VASCULAR ACCESS;  Surgeon: Gretta Bruckner PARAS, MD;  Location: [facility] OR;  Service: Vascular;  Laterality: Right;   ULTRASOUND GUIDANCE FOR VASCULAR ACCESS Left 08/20/2022   Procedure: ULTRASOUND GUIDANCE FOR VASCULAR ACCESS;  Surgeon: Gretta Bruckner PARAS, MD;  Location: [facility] OR;  Service: Vascular;  Laterality: Left;   YAG LASER APPLICATION Right 08/17/2016   Procedure: YAG LASER APPLICATION;  Surgeon: Dow JULIANNA Burke, MD;  Location: [facility] ORS;  Service: Ophthalmology;  Laterality: Right;    Social History   Socioeconomic History   Marital status: Married    Spouse name: Tammy   Number of children: Not on file   Years of education: Not on file   Highest education level: Not on file  Occupational History   Occupation: Full time    Comment: Contractor  Tobacco Use   Smoking status: Never    Passive exposure: Never   Smokeless tobacco: Never   Tobacco comments:    Never smoke 05/18/22  Vaping Use   Vaping status: Never Used  Substance and Sexual Activity   Alcohol  use: No    Alcohol /week: 0.0 standard drinks of alcohol    Drug use: No   Sexual activity: Not Currently  Other Topics Concern  Not on file  Social History Narrative   Not on file   Social Drivers of Health   Financial Resource Strain: Not on file  Food Insecurity: No Food Insecurity (11/13/2023)   Hunger Vital Sign    Worried About Running Out of Food in the Last Year: Never true    Ran Out of Food in the Last Year: Never true  Transportation Needs: No Transportation Needs (11/13/2023)   PRAPARE - Administrator, Civil Service (Medical): No    Lack of Transportation (Non-Medical): No  Physical Activity:  Not on file  Stress: Not on file  Social Connections: Socially Integrated (11/13/2023)   Social Connection and Isolation Panel    Frequency of Communication with Friends and Family: Three times a week    Frequency of Social Gatherings with Friends and Family: Once a week    Attends Religious Services: 1 to 4 times per year    Active Member of [employer] or Organizations: Yes    Attends Banker Meetings: 1 to 4 times per year    Marital Status: Married  Catering manager Violence: Not At Risk (11/13/2023)   Humiliation, Afraid, Rape, and Kick questionnaire    Fear of Current or Ex-Partner: No    Emotionally Abused: No    Physically Abused: No    Sexually Abused: No    Family History  Problem Relation Age of Onset   Stroke Other    Heart disease Other     Current Outpatient Medications  Medication Sig Dispense Refill   atorvastatin  (LIPITOR) 40 MG tablet Take 40 mg by mouth at bedtime.     cholecalciferol  (VITAMIN D ) 25 MCG (1000 UNIT) tablet Take 1,000 Units by mouth daily.     FARXIGA  10 MG TABS tablet TAKE 1 TABLET BY MOUTH DAILY 30 tablet 4   fish oil-omega-3 fatty acids  1000 MG capsule Take 1 g by mouth every evening.     hydrALAZINE  (APRESOLINE ) 25 MG tablet Take 2 tablets (50 mg total) by mouth 3 (three) times daily.     macitentan  (OPSUMIT ) 10 MG tablet TAKE 1 TABLET BY MOUTH DAILY 90 tablet 3   Misc Natural Products (GLUCOS-CHONDROIT-MSM COMPLEX) TABS Take 2 tablets by mouth daily.     Multiple Vitamins-Minerals (MULTIVITAMIN WITH MINERALS) tablet Take 1 tablet by mouth in the morning.  Centrum Silver     niacin  500 MG tablet Take 500 mg by mouth in the morning.     potassium chloride  (KLOR-CON  M) 10 MEQ tablet Take 10 mEq by mouth daily.     sodium zirconium cyclosilicate  (LOKELMA ) 10 g PACK packet Take 10 g by mouth as directed. 11 packet 2   tadalafil  (CIALIS ) 20 MG tablet TAKE 2 TABLETS BY MOUTH DAILY 180 tablet 3   torsemide  (DEMADEX ) 20 MG tablet Take 3 tablets  (60 mg total) by mouth daily.     warfarin (COUMADIN ) 5 MG tablet Take 5-7.5 mg by mouth See admin instructions. Take 5 mg on Saturday,Sunday Tues, Wed, Thursday, and then  Take 7.5 mg on Monday,  Wednesday, and Friday     No current facility-administered medications for this visit.    Allergies  Allergen Reactions   Other Rash    Bandaids     REVIEW OF SYSTEMS:   [X]  denotes positive finding, [ ]  denotes negative finding Cardiac  Comments:  Chest pain or chest pressure:    Shortness of breath upon exertion:    Short of breath when lying flat:  Irregular heart rhythm:        Vascular    Pain in calf, thigh, or hip brought on by ambulation:    Pain in feet at night that wakes you up from your sleep:     Blood clot in your veins:    Leg swelling:         Pulmonary    Oxygen  at home:    Productive cough:     Wheezing:         Neurologic    Sudden weakness in arms or legs:     Sudden numbness in arms or legs:     Sudden onset of difficulty speaking or slurred speech:    Temporary loss of vision in one eye:     Problems with dizziness:         Gastrointestinal    Blood in stool:     Vomited blood:         Genitourinary    Burning when urinating:     Blood in urine:        Psychiatric    Major depression:         Hematologic    Bleeding problems:    Problems with blood clotting too easily:        Skin    Rashes or ulcers:        Constitutional    Fever or chills:      PHYSICAL EXAMINATION:  Vitals:   04/14/24 1423 04/14/24 1426  BP: (!) 224/68 (!) 222/62  Pulse: (!) 58   Temp: 98.5 F (36.9 C)   TempSrc: Temporal   SpO2: 91%   Weight: 178 lb 4.8 oz (80.9 kg)   Height: 5' 4 (1.626 m)     General:  WDWN in NAD; vital signs documented above Gait: Not observed HENT: WNL, normocephalic Pulmonary: normal non-labored breathing Cardiac: irregular HR Abdomen: soft, NT, no masses Skin: without rashes Vascular Exam/Pulses: symmetrical radial  pulses Extremities: without ischemic changes, without Gangrene , without cellulitis; without open wounds;  Musculoskeletal: no muscle wasting or atrophy  Neurologic: A&O X 3; CN grossly intact Psychiatric:  The pt has Normal affect.   Non-Invasive Vascular Imaging:   R ICA stent widely patent  L ICA stent with 293/41cm/s mid stent; stable compared to 6 months prior    ASSESSMENT/PLAN:: 83 y.o. male here for follow up for surveillance of carotid artery stenosis  Subjectively, Robert Baldwin is doing well with no strokelike symptoms since last office visit 6 months ago.  Carotid duplex is stable demonstrating a widely patent right ICA stent.  He has moderate velocity elevation through his left ICA stent however these velocities are stable over the past couple office visits.  Findings were discussed with Dr. Gretta who agrees to continue with the 23-month surveillance per protocol.  We will also add an 81 mg aspirin  daily.  He knows to seek immediate medical attention if he develops any strokelike symptoms.  We will otherwise see him back with a repeat duplex in 6 months.   Donnice Sender, PA-C [facility] [phone]  Clinic MD:   Gretta

## 2024-04-14 ENCOUNTER — Ambulatory Visit (HOSPITAL_COMMUNITY)
Admission: RE | Admit: 2024-04-14 | Discharge: 2024-04-14 | Disposition: A | Discharge status: Home / Self Care | Payer: Medicare PPO | Source: Ambulatory Visit | Attending: Vascular Surgery | Admitting: Vascular Surgery

## 2024-04-14 ENCOUNTER — Ambulatory Visit: Payer: Medicare PPO | Admitting: Physician Assistant

## 2024-04-14 VITALS — BP 222/62 | HR 58 | Temp 98.5°F | Ht 64.0 in | Wt 178.3 lb

## 2024-04-14 DIAGNOSIS — I6523 Occlusion and stenosis of bilateral carotid arteries: Secondary | ICD-10-CM

## 2024-04-14 MED ORDER — ASPIRIN 81 MG PO TBEC: 81.0000 mg | DELAYED_RELEASE_TABLET | Freq: Every day | ORAL | Status: DC

## 2024-04-15 ENCOUNTER — Encounter (HOSPITAL_COMMUNITY)
Admission: RE | Admit: 2024-04-15 | Discharge: 2024-04-15 | Disposition: A | Discharge status: Home / Self Care | Source: Ambulatory Visit | Attending: Cardiology | Admitting: Cardiology

## 2024-04-15 DIAGNOSIS — R0600 Dyspnea, unspecified: Secondary | ICD-10-CM | POA: Diagnosis not present

## 2024-04-15 DIAGNOSIS — I272 Pulmonary hypertension, unspecified: Secondary | ICD-10-CM

## 2024-04-15 NOTE — Progress Notes (Signed)
 Daily Session Note  Patient Details  Name: KENLEE MALER MRN: [id number] Date of Birth: [birth year] Referring Provider:   Flowsheet Row PULMONARY REHAB OTHER RESP ORIENTATION from 01/23/2024 in [facility] CARDIAC REHABILITATION  Referring Provider Rolan Barrack MD    Encounter Date: 04/15/2024  Check In:  Session Check In - 04/15/24 0915       Check-In   Supervising physician immediately available to respond to emergencies See telemetry face sheet for immediately available MD    Location [facility]    Staff Present Adrien Louder, RN, BSN;Heather Con, BS, Exercise Physiologist   Myrick Browner, [place]   Virtual Visit No    Medication changes reported     Yes    Comments MD added ASA 81 mg daily.    Fall or balance concerns reported    No    Warm-up and Cool-down Performed on first and last piece of equipment    Resistance Training Performed Yes    VAD Patient? No    PAD/SET Patient? No      Pain Assessment   Currently in Pain? No/denies    Pain Score 0-No pain    Multiple Pain Sites No          Capillary Blood Glucose: No results found for this or any previous visit (from the past 24 hours).    Social History   Tobacco Use  Smoking Status Never   Passive exposure: Never  Smokeless Tobacco Never  Tobacco Comments   Never smoke 05/18/22    Goals Met:  Proper associated with RPD/PD & O2 Sat Independence with exercise equipment Using PLB without cueing & demonstrates good technique Exercise tolerated well No report of concerns or symptoms today Strength training completed today  Goals Unmet:  Not Applicable  Comments: Pt able to follow exercise prescription today without complaint.  Will continue to monitor for progression.

## 2024-04-17 ENCOUNTER — Encounter (HOSPITAL_COMMUNITY)
Admission: RE | Admit: 2024-04-17 | Discharge: 2024-04-17 | Disposition: A | Discharge status: Home / Self Care | Source: Ambulatory Visit | Attending: Cardiology | Admitting: Cardiology

## 2024-04-17 DIAGNOSIS — I272 Pulmonary hypertension, unspecified: Secondary | ICD-10-CM

## 2024-04-17 NOTE — Progress Notes (Signed)
 Daily Session Note  Patient Details  Name: Robert Baldwin MRN: [id number] Date of Birth: 01-03-1941 Referring Provider:   Flowsheet Row PULMONARY REHAB OTHER RESP ORIENTATION from 01/23/2024 in [facility] CARDIAC REHABILITATION  Referring Provider Rolan Barrack MD    Encounter Date: 04/17/2024  Check In:  Session Check In - 04/17/24 0917       Check-In   Supervising physician immediately available to respond to emergencies See telemetry face sheet for immediately available MD    Location [facility]    Staff Present Powell Benders, BS, Exercise Physiologist;Adilen Pavelko Jackquline, BSN, RN, Estrella Daring, BS, RRT, CPFT    Virtual Visit No    Medication changes reported     No    Fall or balance concerns reported    No    Tobacco Cessation No Change    Warm-up and Cool-down Performed on first and last piece of equipment    Resistance Training Performed Yes    VAD Patient? No    PAD/SET Patient? No      Pain Assessment   Currently in Pain? No/denies          Capillary Blood Glucose: No results found for this or any previous visit (from the past 24 hours).    Social History   Tobacco Use  Smoking Status Never   Passive exposure: Never  Smokeless Tobacco Never  Tobacco Comments   Never smoke 05/18/22    Goals Met:  Proper associated with RPD/PD & O2 Sat Independence with exercise equipment Improved SOB with ADL's Using PLB without cueing & demonstrates good technique Exercise tolerated well No report of concerns or symptoms today Strength training completed today  Goals Unmet:  Not Applicable  Comments: Pt able to follow exercise prescription today without complaint.  Will continue to monitor for progression.

## 2024-04-20 ENCOUNTER — Encounter (HOSPITAL_COMMUNITY)
Admission: RE | Admit: 2024-04-20 | Discharge: 2024-04-20 | Disposition: A | Discharge status: Home / Self Care | Source: Ambulatory Visit | Attending: Cardiology | Admitting: Cardiology

## 2024-04-20 DIAGNOSIS — I272 Pulmonary hypertension, unspecified: Secondary | ICD-10-CM

## 2024-04-20 DIAGNOSIS — R0600 Dyspnea, unspecified: Secondary | ICD-10-CM | POA: Diagnosis not present

## 2024-04-20 NOTE — Progress Notes (Signed)
 Daily Session Note  Patient Details  Name: Robert Baldwin MRN: [id number] Date of Birth: [birth year] Referring Provider:   Flowsheet Row PULMONARY REHAB OTHER RESP ORIENTATION from 01/23/2024 in [facility] CARDIAC REHABILITATION  Referring Provider Rolan Barrack MD    Encounter Date: 04/20/2024  Check In:  Session Check In - 04/20/24 0939       Check-In   Supervising physician immediately available to respond to emergencies See telemetry face sheet for immediately available MD    Location [facility]    Staff Present Danney Daring, BS, RRT, CPFT;Heather Con, BS, Exercise Physiologist;Loma Dubuque Vonzell, MA, RCEP, CCRP, Sueellen Louder, RN, BSN    Virtual Visit No    Medication changes reported     No    Fall or balance concerns reported    No    Warm-up and Cool-down Performed on first and last piece of equipment    Resistance Training Performed Yes    VAD Patient? No    PAD/SET Patient? No      Pain Assessment   Currently in Pain? No/denies          Capillary Blood Glucose: No results found for this or any previous visit (from the past 24 hours).    Social History   Tobacco Use  Smoking Status Never   Passive exposure: Never  Smokeless Tobacco Never  Tobacco Comments   Never smoke 05/18/22    Goals Met:  Proper associated with RPD/PD & O2 Sat Independence with exercise equipment Using PLB without cueing & demonstrates good technique Exercise tolerated well No report of concerns or symptoms today Strength training completed today  Goals Unmet:  Not Applicable  Comments: Pt able to follow exercise prescription today without complaint.  Will continue to monitor for progression.

## 2024-04-21 ENCOUNTER — Other Ambulatory Visit (HOSPITAL_COMMUNITY): Payer: Self-pay | Admitting: Cardiology

## 2024-04-22 ENCOUNTER — Encounter (HOSPITAL_COMMUNITY)
Admission: RE | Admit: 2024-04-22 | Discharge: 2024-04-22 | Disposition: A | Discharge status: Home / Self Care | Source: Ambulatory Visit | Attending: Cardiology | Admitting: Cardiology

## 2024-04-22 VITALS — Ht 64.0 in | Wt 179.7 lb

## 2024-04-22 DIAGNOSIS — I272 Pulmonary hypertension, unspecified: Secondary | ICD-10-CM | POA: Insufficient documentation

## 2024-04-22 NOTE — Patient Instructions (Signed)
 Discharge Patient Instructions  Patient Details  Name: Robert Baldwin MRN: [id number] Date of Birth: 19-Sep-1941 Referring Provider:  Maree Isles, MD   Number of Visits: 50  Reason for Discharge:  Patient reached a stable level of exercise. Patient independent in their exercise. Patient has met program and personal goals.  Diagnosis:  Pulmonary hypertension (HCC)  Initial Exercise Prescription:  Initial Exercise Prescription - 01/23/24 1400       Date of Initial Exercise RX and Referring Provider   Date 01/23/24    Referring Provider Rolan Barrack MD      Oxygen    Maintain Oxygen  Saturation 88% or higher      NuStep   Level 2    SPM 60    Minutes 15    METs 1.9      Track   Laps 15    Minutes 15    METs 1.9      Prescription Details   Frequency (times per week) 2    Duration Progress to 30 minutes of continuous aerobic without signs/symptoms of physical distress      Intensity   THRR 40-80% of Max Heartrate 81-119    Ratings of Perceived Exertion 11-13    Perceived Dyspnea 0-4      Resistance Training   Training Prescription Yes    Weight 3    Reps 10-15          Discharge Exercise Prescription (Final Exercise Prescription Changes):  Exercise Prescription Changes - 04/13/24 1500       Response to Exercise   Blood Pressure (Admit) 112/60    Blood Pressure (Exit) 142/70    Heart Rate (Admit) 44 bpm    Heart Rate (Exercise) 63 bpm    Heart Rate (Exit) 50 bpm    Oxygen  Saturation (Admit) 90 %    Oxygen  Saturation (Exercise) 88 %    Oxygen  Saturation (Exit) 93 %    Rating of Perceived Exertion (Exercise) 11    Perceived Dyspnea (Exercise) 2    Duration Continue with 30 min of aerobic exercise without signs/symptoms of physical distress.    Intensity THRR unchanged      Progression   Progression Continue to progress workloads to maintain intensity without signs/symptoms of physical distress.      Resistance Training   Training Prescription Yes     Weight 3    Reps 10-15      NuStep   Level 2    SPM 81    Minutes 25    METs 2      Track   Laps 6    Minutes 5    METs 1      Home Exercise Plan   Plans to continue exercise at Home (comment)    Frequency Add 2 additional days to program exercise sessions.          Functional Capacity:  6 Minute Walk     Row Name 01/23/24 1445 04/22/24 0947       6 Minute Walk   Phase Initial Discharge    Distance 860 feet 760 feet    Walk Time 6 minutes 6 minutes    # of Rest Breaks 0 2    MPH 1.63 1.44    METS 1.29 0.93    RPE 12 13    Perceived Dyspnea  2 2    VO2 Peak 4.52 3.27    Symptoms No Yes (comment)    Comments -- two breaks due to fatigue  Resting HR 73 bpm 47 bpm    Resting BP 180/80 132/64    Resting Oxygen  Saturation  90 % 90 %    Exercise Oxygen  Saturation  during 6 min walk 85 % 96 %    Max Ex. HR 78 bpm 75 bpm    Max Ex. BP 164/70 150/68    2 Minute Post BP 148/70 136/64      Interval HR   1 Minute HR 66 60    2 Minute HR 68 65    3 Minute HR 70 75    4 Minute HR 71 75    5 Minute HR 76 74    6 Minute HR 78 70    2 Minute Post HR 53 62    Interval Heart Rate? Yes Yes      Interval Oxygen    Interval Oxygen ? Yes Yes    Baseline Oxygen  Saturation % 90 % 90 %    1 Minute Oxygen  Saturation % 86 % 89 %    1 Minute Liters of Oxygen  0 L 0 L    2 Minute Oxygen  Saturation % 86 % 86 %    2 Minute Liters of Oxygen  0 L 0 L    3 Minute Oxygen  Saturation % 86 % 87 %    3 Minute Liters of Oxygen  0 L 0 L    4 Minute Oxygen  Saturation % 86 % 86 %    4 Minute Liters of Oxygen  0 L 0 L    5 Minute Oxygen  Saturation % 85 % 87 %    5 Minute Liters of Oxygen  0 L 0 L    6 Minute Oxygen  Saturation % 86 % 88 %    6 Minute Liters of Oxygen  0 L 0 L    2 Minute Post Oxygen  Saturation % 91 % 90 %    2 Minute Post Liters of Oxygen  0 L 0 L      Nutrition & Weight - Outcomes:  Pre Biometrics - 01/23/24 1450       Pre Biometrics   Height 5' 4 (1.626 m)     Weight 78 kg    Waist Circumference 40 inches    Hip Circumference 41 inches    Waist to Hip Ratio 0.98 %    BMI (Calculated) 29.5    Grip Strength 10.4 kg          Post Biometrics - 04/22/24 0949        Post  Biometrics   Height 5' 4 (1.626 m)    Weight 81.5 kg    Waist Circumference 40 inches    Hip Circumference 41 inches    Waist to Hip Ratio 0.98 %    BMI (Calculated) 30.83    Grip Strength 14 kg           Goals reviewed with patient; copy given to patient.

## 2024-04-22 NOTE — Progress Notes (Signed)
 Daily Session Note  Patient Details  Name: Robert Baldwin MRN: [id number] Date of Birth: 05-Dec-1940 Referring Provider:   Flowsheet Row PULMONARY REHAB OTHER RESP ORIENTATION from 01/23/2024 in [facility] CARDIAC REHABILITATION  Referring Provider Rolan Barrack MD    Encounter Date: 04/22/2024  Check In:  Session Check In - 04/22/24 0900       Check-In   Supervising physician immediately available to respond to emergencies See telemetry face sheet for immediately available MD    Location [facility]    Staff Present Adrien Louder, RN, BSN;Heather Con, BS, Exercise Physiologist;Jessica Vonzell, MA, RCEP, CCRP, CCET    Virtual Visit No    Medication changes reported     No    Fall or balance concerns reported    No    Warm-up and Cool-down Performed on first and last piece of equipment    Resistance Training Performed Yes    VAD Patient? No    PAD/SET Patient? No      Pain Assessment   Currently in Pain? No/denies    Pain Score 0-No pain    Multiple Pain Sites No          Capillary Blood Glucose: No results found for this or any previous visit (from the past 24 hours).    Social History   Tobacco Use  Smoking Status Never   Passive exposure: Never  Smokeless Tobacco Never  Tobacco Comments   Never smoke 05/18/22    Goals Met:  Proper associated with RPD/PD & O2 Sat Independence with exercise equipment Using PLB without cueing & demonstrates good technique Exercise tolerated well No report of concerns or symptoms today Strength training completed today  Goals Unmet:  Not Applicable  Comments: Pt able to follow exercise prescription today without complaint.  Will continue to monitor for progression.

## 2024-04-27 ENCOUNTER — Encounter (HOSPITAL_COMMUNITY): Admitting: Cardiology

## 2024-04-27 ENCOUNTER — Encounter (HOSPITAL_COMMUNITY)
Admission: RE | Admit: 2024-04-27 | Discharge: 2024-04-27 | Disposition: A | Discharge status: Home / Self Care | Source: Ambulatory Visit | Attending: Cardiology | Admitting: Cardiology

## 2024-04-27 DIAGNOSIS — I50813 Acute on chronic right heart failure: Secondary | ICD-10-CM | POA: Diagnosis not present

## 2024-04-27 DIAGNOSIS — I272 Pulmonary hypertension, unspecified: Secondary | ICD-10-CM | POA: Diagnosis not present

## 2024-04-27 NOTE — Progress Notes (Signed)
 Daily Session Note  Patient Details  Name: TIP ATIENZA MRN: [id number] Date of Birth: 1941-07-06 Referring Provider:   Flowsheet Row PULMONARY REHAB OTHER RESP ORIENTATION from 01/23/2024 in [facility] CARDIAC REHABILITATION  Referring Provider Rolan Barrack MD    Encounter Date: 04/27/2024  Check In:  Session Check In - 04/27/24 0915       Check-In   Supervising physician immediately available to respond to emergencies See telemetry face sheet for immediately available MD    Location [facility]    Staff Present Laymon Rattler, BSN, RN, Rosalba Gelineau, MA, RCEP, CCRP, CCET    Virtual Visit No    Medication changes reported     No    Fall or balance concerns reported    No    Tobacco Cessation No Change    Warm-up and Cool-down Performed on first and last piece of equipment    Resistance Training Performed Yes    VAD Patient? No    PAD/SET Patient? No      Pain Assessment   Currently in Pain? No/denies          Capillary Blood Glucose: No results found for this or any previous visit (from the past 24 hours).    Social History   Tobacco Use  Smoking Status Never   Passive exposure: Never  Smokeless Tobacco Never  Tobacco Comments   Never smoke 05/18/22    Goals Met:  Proper associated with RPD/PD & O2 Sat Independence with exercise equipment Improved SOB with ADL's Using PLB without cueing & demonstrates good technique Exercise tolerated well No report of concerns or symptoms today Strength training completed today  Goals Unmet:  Not Applicable  Comments: Pt able to follow exercise prescription today without complaint.  Will continue to monitor for progression.

## 2024-04-29 ENCOUNTER — Encounter (HOSPITAL_COMMUNITY)
Admission: RE | Admit: 2024-04-29 | Discharge: 2024-04-29 | Disposition: A | Discharge status: Home / Self Care | Source: Ambulatory Visit | Attending: Cardiology

## 2024-04-29 DIAGNOSIS — I272 Pulmonary hypertension, unspecified: Secondary | ICD-10-CM

## 2024-04-29 NOTE — Progress Notes (Signed)
 Daily Session Note  Patient Details  Name: Robert Baldwin MRN: [id number] Date of Birth: 11/04/1940 Referring Provider:   Flowsheet Row PULMONARY REHAB OTHER RESP ORIENTATION from 01/23/2024 in [facility] CARDIAC REHABILITATION  Referring Provider Rolan Barrack MD    Encounter Date: 04/29/2024  Check In:  Session Check In - 04/29/24 0953       Check-In   Supervising physician immediately available to respond to emergencies See telemetry face sheet for immediately available MD    Location [facility]    Staff Present Laymon Rattler, BSN, RN, Rosalba Gelineau, MA, RCEP, CCRP, CCET    Virtual Visit No    Medication changes reported     No    Fall or balance concerns reported    No    Tobacco Cessation No Change    Warm-up and Cool-down Performed on first and last piece of equipment    Resistance Training Performed Yes    VAD Patient? No    PAD/SET Patient? No      Pain Assessment   Currently in Pain? No/denies          Capillary Blood Glucose: No results found for this or any previous visit (from the past 24 hours).    Social History   Tobacco Use  Smoking Status Never   Passive exposure: Never  Smokeless Tobacco Never  Tobacco Comments   Never smoke 05/18/22    Goals Met:  Proper associated with RPD/PD & O2 Sat Independence with exercise equipment Improved SOB with ADL's Using PLB without cueing & demonstrates good technique Exercise tolerated well No report of concerns or symptoms today Strength training completed today  Goals Unmet:  Not Applicable  Comments: Pt able to follow exercise prescription today without complaint.  Will continue to monitor for progression.

## 2024-05-01 ENCOUNTER — Encounter (HOSPITAL_COMMUNITY)
Admission: RE | Admit: 2024-05-01 | Discharge: 2024-05-01 | Disposition: A | Discharge status: Home / Self Care | Source: Ambulatory Visit | Attending: Cardiology

## 2024-05-01 DIAGNOSIS — I272 Pulmonary hypertension, unspecified: Secondary | ICD-10-CM | POA: Diagnosis not present

## 2024-05-01 NOTE — Progress Notes (Signed)
 Daily Session Note  Patient Details  Name: Robert Baldwin MRN: [id number] Date of Birth: Dec 25, 1940 Referring Provider:   Flowsheet Row PULMONARY REHAB OTHER RESP ORIENTATION from 01/23/2024 in [facility] CARDIAC REHABILITATION  Referring Provider Robert Barrack MD    Encounter Date: 05/01/2024  Check In:  Session Check In - 05/01/24 0915       Check-In   Supervising physician immediately available to respond to emergencies See telemetry face sheet for immediately available MD    Location [facility]    Staff Present Robert Baldwin, BS, Exercise Physiologist;Robert Vonzell, MA, RCEP, CCRP, CCET;Robert Baldwin, BS, RRT, CPFT    Virtual Visit No    Medication changes reported     No    Fall or balance concerns reported    No    Tobacco Cessation No Change    Warm-up and Cool-down Performed on first and last piece of equipment    Resistance Training Performed Yes    VAD Patient? No    PAD/SET Patient? No      Pain Assessment   Currently in Pain? No/denies    Pain Score 0-No pain    Multiple Pain Sites No          Capillary Blood Glucose: No results found for this or any previous visit (from the past 24 hours).    Social History   Tobacco Use  Smoking Status Never   Passive exposure: Never  Smokeless Tobacco Never  Tobacco Comments   Never smoke 05/18/22    Goals Met:  Independence with exercise equipment Using PLB without cueing & demonstrates good technique Exercise tolerated well No report of concerns or symptoms today Strength training completed today  Goals Unmet:  Not Applicable  Comments:  Robert Baldwin graduated today from  rehab with 36 sessions completed.  Details of the patient's exercise prescription and what He needs to do in order to continue the prescription and progress were discussed with patient.  Patient was given a copy of prescription and goals.  Patient verbalized understanding. Robert Baldwin plans to continue to exercise by exercising at  home.

## 2024-05-01 NOTE — Progress Notes (Cosign Needed Addendum)
 Pulmonary Individual Treatment Plan  Patient Details  Name: Robert Baldwin MRN: [id number] Date of Birth: 08-23-1941 Referring Provider:   Flowsheet Row PULMONARY REHAB OTHER RESP ORIENTATION from 01/23/2024 in [facility] CARDIAC REHABILITATION  Referring Provider Rolan Barrack MD    Initial Encounter Date:  Flowsheet Row PULMONARY REHAB OTHER RESP ORIENTATION from 01/23/2024 in [place] CARDIAC REHABILITATION  Date 01/23/24    Visit Diagnosis: Pulmonary hypertension (HCC)  Patient's Home Medications on Admission:   Current Outpatient Medications:    aspirin  EC 81 MG tablet, Take 1 tablet (81 mg total) by mouth daily. Swallow whole., Disp: , Rfl:    atorvastatin  (LIPITOR) 40 MG tablet, Take 40 mg by mouth at bedtime., Disp: , Rfl:    cholecalciferol  (VITAMIN D ) 25 MCG (1000 UNIT) tablet, Take 1,000 Units by mouth daily., Disp: , Rfl:    FARXIGA  10 MG TABS tablet, TAKE 1 TABLET BY MOUTH DAILY, Disp: 30 tablet, Rfl: 4   fish oil-omega-3 fatty acids  1000 MG capsule, Take 1 g by mouth every evening., Disp: , Rfl:    hydrALAZINE  (APRESOLINE ) 25 MG tablet, Take 2 tablets (50 mg total) by mouth 3 (three) times daily., Disp: , Rfl:    LOKELMA  10 g PACK packet, TAKE 10 GRAM BY MOUTH AS DIRECTED, Disp: 11 packet, Rfl: 2   macitentan  (OPSUMIT ) 10 MG tablet, TAKE 1 TABLET BY MOUTH DAILY, Disp: 90 tablet, Rfl: 3   Misc Natural Products (GLUCOS-CHONDROIT-MSM COMPLEX) TABS, Take 2 tablets by mouth daily., Disp: , Rfl:    Multiple Vitamins-Minerals (MULTIVITAMIN WITH MINERALS) tablet, Take 1 tablet by mouth in the morning.  Centrum Silver, Disp: , Rfl:    niacin  500 MG tablet, Take 500 mg by mouth in the morning., Disp: , Rfl:    potassium chloride  (KLOR-CON  M) 10 MEQ tablet, Take 10 mEq by mouth daily., Disp: , Rfl:    tadalafil  (CIALIS ) 20 MG tablet, TAKE 2 TABLETS BY MOUTH DAILY, Disp: 180 tablet, Rfl: 3   torsemide  (DEMADEX ) 20 MG tablet, Take 3 tablets (60 mg total) by mouth daily., Disp: , Rfl:     warfarin (COUMADIN ) 5 MG tablet, Take 5-7.5 mg by mouth See admin instructions. Take 5 mg on Saturday,Sunday Tues, Wed, Thursday, and then  Take 7.5 mg on Monday,  Wednesday, and Friday, Disp: , Rfl:   Past Medical History: Past Medical History:  Diagnosis Date   (HFpEF) heart failure with preserved ejection fraction (HCC)    Arthritis    Atrial fibrillation (HCC)    Bradycardia    Carotid artery disease (HCC)    Hyperlipidemia    Hypertension    Pulmonary hypertension (HCC)    Type 2 diabetes mellitus (HCC)     Tobacco Use: Social History   Tobacco Use  Smoking Status Never   Passive exposure: Never  Smokeless Tobacco Never  Tobacco Comments   Never smoke 05/18/22    Labs: Review Flowsheet  More data exists      Latest Ref Rng & Units 05/14/2023 05/21/2023 11/13/2023 11/14/2023 12/18/2023  Labs for ITP Cardiac and Pulmonary Rehab  Cholestrol 0 - 200 mg/dL 98  - - - 881   LDL (calc) 0 - 99 mg/dL 47  - - - 50   HDL-C >59 mg/dL 42  - - - 59   Trlycerides <150 mg/dL 44  - - - 45   Hemoglobin A1c 4.8 - 5.6 % - - - 7.4  -  PH, Arterial 7.35 - 7.45 - 7.402  7.415  - - -  PCO2 arterial 32 - 48 mmHg - 41.4  40.9  - - -  Bicarbonate 20.0 - 28.0 mmol/L - 25.7  26.2  31.9  - -  TCO2 22 - 32 mmol/L - 27  27  29   - -  O2 Saturation % - 73  75  86.4  - -    Details       Multiple values from one day are sorted in reverse-chronological order         Capillary Blood Glucose: Lab Results  Component Value Date   GLUCAP 124 (H) 02/05/2024   GLUCAP 156 (H) 02/05/2024   GLUCAP 155 (H) 02/03/2024   GLUCAP 130 (H) 01/29/2024   GLUCAP 138 (H) 01/29/2024     Pulmonary Assessment Scores:  Pulmonary Assessment Scores     Row Name 01/23/24 1520 04/29/24 0924       ADL UCSD   ADL Phase Entry Exit    SOB Score total 23 21    Rest 0 1    Walk 3 2    Stairs 3 3    Bath 2 2    Dress 2 2    Shop 0 0      CAT Score   CAT Score 10 --      mMRC Score   mMRC Score 2 --       UCSD: Self-administered rating of dyspnea associated with activities of daily living (ADLs) 6-point scale (0 = not at all to 5 = maximal or unable to do because of breathlessness)  Scoring Scores range from 0 to 120.  Minimally important difference is 5 units  CAT: CAT can identify the health impairment of COPD patients and is better correlated with disease progression.  CAT has a scoring range of zero to 40. The CAT score is classified into four groups of low (less than 10), medium (10 - 20), high (21-30) and very high (31-40) based on the impact level of disease on health status. A CAT score over 10 suggests significant symptoms.  A worsening CAT score could be explained by an exacerbation, poor medication adherence, poor inhaler technique, or progression of COPD or comorbid conditions.  CAT MCID is 2 points  mMRC: mMRC (Modified Medical Research Council) Dyspnea Scale is used to assess the degree of baseline functional disability in patients of respiratory disease due to dyspnea. No minimal important difference is established. A decrease in score of 1 point or greater is considered a positive change.   Pulmonary Function Assessment:   Exercise Target Goals: Exercise Program Goal: Individual exercise prescription set using results from initial 6 min walk test and THRR while considering  patient's activity barriers and safety.   Exercise Prescription Goal: Initial exercise prescription builds to 30-45 minutes a day of aerobic activity, 2-3 days per week.  Home exercise guidelines will be given to patient during program as part of exercise prescription that the participant will acknowledge.  Activity Barriers & Risk Stratification:  Activity Barriers & Cardiac Risk Stratification - 01/14/24 1318       Activity Barriers & Cardiac Risk Stratification   Activity Barriers Deconditioning;Assistive Device          6 Minute Walk:  6 Minute Walk     Row Name 01/23/24 1445  04/22/24 0947       6 Minute Walk   Phase Initial Discharge    Distance 860 feet 760 feet    Walk Time 6 minutes 6  minutes    # of Rest Breaks 0 2    MPH 1.63 1.44    METS 1.29 0.93    RPE 12 13    Perceived Dyspnea  2 2    VO2 Peak 4.52 3.27    Symptoms No Yes (comment)    Comments -- two breaks due to fatigue    Resting HR 73 bpm 47 bpm    Resting BP 180/80 132/64    Resting Oxygen  Saturation  90 % 90 %    Exercise Oxygen  Saturation  during 6 min walk 85 % 96 %    Max Ex. HR 78 bpm 75 bpm    Max Ex. BP 164/70 150/68    2 Minute Post BP 148/70 136/64      Interval HR   1 Minute HR 66 60    2 Minute HR 68 65    3 Minute HR 70 75    4 Minute HR 71 75    5 Minute HR 76 74    6 Minute HR 78 70    2 Minute Post HR 53 62    Interval Heart Rate? Yes Yes      Interval Oxygen    Interval Oxygen ? Yes Yes    Baseline Oxygen  Saturation % 90 % 90 %    1 Minute Oxygen  Saturation % 86 % 89 %    1 Minute Liters of Oxygen  0 L 0 L    2 Minute Oxygen  Saturation % 86 % 86 %    2 Minute Liters of Oxygen  0 L 0 L    3 Minute Oxygen  Saturation % 86 % 87 %    3 Minute Liters of Oxygen  0 L 0 L    4 Minute Oxygen  Saturation % 86 % 86 %    4 Minute Liters of Oxygen  0 L 0 L    5 Minute Oxygen  Saturation % 85 % 87 %    5 Minute Liters of Oxygen  0 L 0 L    6 Minute Oxygen  Saturation % 86 % 88 %    6 Minute Liters of Oxygen  0 L 0 L    2 Minute Post Oxygen  Saturation % 91 % 90 %    2 Minute Post Liters of Oxygen  0 L 0 L       Oxygen  Initial Assessment:  Oxygen  Initial Assessment - 01/14/24 1312       Home Oxygen    Home Oxygen  Device None    Sleep Oxygen  Prescription None    Home Exercise Oxygen  Prescription None    Home Resting Oxygen  Prescription None    Compliance with Home Oxygen  Use Yes      Intervention   Short Term Goals To learn and exhibit compliance with exercise, home and travel O2 prescription;To learn and understand importance of monitoring SPO2 with pulse oximeter and  demonstrate accurate use of the pulse oximeter.;To learn and understand importance of maintaining oxygen  saturations>88%;To learn and demonstrate proper pursed lip breathing techniques or other breathing techniques. ;To learn and demonstrate proper use of respiratory medications    Long  Term Goals Exhibits compliance with exercise, home  and travel O2 prescription;Maintenance of O2 saturations>88%;Compliance with respiratory medication;Demonstrates proper use of MDI's;Exhibits proper breathing techniques, such as pursed lip breathing or other method taught during program session;Verbalizes importance of monitoring SPO2 with pulse oximeter and return demonstration          Oxygen  Re-Evaluation:  Oxygen  Re-Evaluation     Row Name 01/27/24  9075 02/28/24 0934 04/14/24 0812         Program Oxygen  Prescription   Program Oxygen  Prescription None None None       Home Oxygen    Home Oxygen  Device None None None     Sleep Oxygen  Prescription None None None     Home Exercise Oxygen  Prescription None None None     Home Resting Oxygen  Prescription None None None     Compliance with Home Oxygen  Use Yes Yes Yes       Goals/Expected Outcomes   Short Term Goals To learn and exhibit compliance with exercise, home and travel O2 prescription;To learn and understand importance of monitoring SPO2 with pulse oximeter and demonstrate accurate use of the pulse oximeter.;To learn and understand importance of maintaining oxygen  saturations>88%;To learn and demonstrate proper pursed lip breathing techniques or other breathing techniques. ;To learn and demonstrate proper use of respiratory medications To learn and exhibit compliance with exercise, home and travel O2 prescription;To learn and understand importance of monitoring SPO2 with pulse oximeter and demonstrate accurate use of the pulse oximeter.;To learn and understand importance of maintaining oxygen  saturations>88%;To learn and demonstrate proper pursed lip  breathing techniques or other breathing techniques. ;To learn and demonstrate proper use of respiratory medications To learn and exhibit compliance with exercise, home and travel O2 prescription;To learn and understand importance of monitoring SPO2 with pulse oximeter and demonstrate accurate use of the pulse oximeter.;To learn and understand importance of maintaining oxygen  saturations>88%;To learn and demonstrate proper pursed lip breathing techniques or other breathing techniques.      Long  Term Goals Exhibits compliance with exercise, home  and travel O2 prescription;Maintenance of O2 saturations>88%;Compliance with respiratory medication;Demonstrates proper use of MDI's;Exhibits proper breathing techniques, such as pursed lip breathing or other method taught during program session;Verbalizes importance of monitoring SPO2 with pulse oximeter and return demonstration Exhibits compliance with exercise, home  and travel O2 prescription;Maintenance of O2 saturations>88%;Compliance with respiratory medication;Demonstrates proper use of MDI's;Exhibits proper breathing techniques, such as pursed lip breathing or other method taught during program session;Verbalizes importance of monitoring SPO2 with pulse oximeter and return demonstration Exhibits compliance with exercise, home  and travel O2 prescription;Maintenance of O2 saturations>88%;Exhibits proper breathing techniques, such as pursed lip breathing or other method taught during program session;Verbalizes importance of monitoring SPO2 with pulse oximeter and return demonstration     Comments Reviewed PLB technique with pt.  Talked about how it works and it's importance in maintaining their exercise saturations. Reviewed PLB technique with pt.  Talked about how it works and it's importance in maintaining their exercise saturations. Antario states his breathing has it's good and bad days. Reviewed PLB with him and how is can help his breathing and bring up his  saturations.     Goals/Expected Outcomes Short: Become more profiecient at using PLB.   Long: Become independent at using PLB. Short: Become more profiecient at using PLB.   Long: Become independent at using PLB. Short: Become more profiecient at using PLB.   Long: Continue to check O2 sat's at home.        Oxygen  Discharge (Final Oxygen  Re-Evaluation):  Oxygen  Re-Evaluation - 04/14/24 0812       Program Oxygen  Prescription   Program Oxygen  Prescription None      Home Oxygen    Home Oxygen  Device None    Sleep Oxygen  Prescription None    Home Exercise Oxygen  Prescription None    Home Resting Oxygen  Prescription None    Compliance with  Home Oxygen  Use Yes      Goals/Expected Outcomes   Short Term Goals To learn and exhibit compliance with exercise, home and travel O2 prescription;To learn and understand importance of monitoring SPO2 with pulse oximeter and demonstrate accurate use of the pulse oximeter.;To learn and understand importance of maintaining oxygen  saturations>88%;To learn and demonstrate proper pursed lip breathing techniques or other breathing techniques.     Long  Term Goals Exhibits compliance with exercise, home  and travel O2 prescription;Maintenance of O2 saturations>88%;Exhibits proper breathing techniques, such as pursed lip breathing or other method taught during program session;Verbalizes importance of monitoring SPO2 with pulse oximeter and return demonstration    Comments Jazon states his breathing has it's good and bad days. Reviewed PLB with him and how is can help his breathing and bring up his saturations.    Goals/Expected Outcomes Short: Become more profiecient at using PLB.   Long: Continue to check O2 sat's at home.          Initial Exercise Prescription:  Initial Exercise Prescription - 01/23/24 1400       Date of Initial Exercise RX and Referring Provider   Date 01/23/24    Referring Provider Rolan Barrack MD      Oxygen    Maintain Oxygen   Saturation 88% or higher      NuStep   Level 2    SPM 60    Minutes 15    METs 1.9      Track   Laps 15    Minutes 15    METs 1.9      Prescription Details   Frequency (times per week) 2    Duration Progress to 30 minutes of continuous aerobic without signs/symptoms of physical distress      Intensity   THRR 40-80% of Max Heartrate 81-119    Ratings of Perceived Exertion 11-13    Perceived Dyspnea 0-4      Resistance Training   Training Prescription Yes    Weight 3    Reps 10-15          Perform Capillary Blood Glucose checks as needed.  Exercise Prescription Changes:   Exercise Prescription Changes     Row Name 01/23/24 1400 03/18/24 1200 03/18/24 1300 03/30/24 1500 04/13/24 1500     Response to Exercise   Blood Pressure (Admit) 180/80 -- 124/62 126/58 112/60   Blood Pressure (Exercise) 164/70 -- -- -- --   Blood Pressure (Exit) 148/70 -- 132/64 120/66 142/70   Heart Rate (Admit) 43 bpm -- 44 bpm 45 bpm 44 bpm   Heart Rate (Exercise) 78 bpm -- 63 bpm 71 bpm 63 bpm   Heart Rate (Exit) 53 bpm -- 58 bpm 51 bpm 50 bpm   Oxygen  Saturation (Admit) 90 % -- 94 % 95 % 90 %   Oxygen  Saturation (Exercise) 80 % -- 92 % 90 % 88 %   Oxygen  Saturation (Exit) 91 % -- 94 % 94 % 93 %   Rating of Perceived Exertion (Exercise) 12 -- 12 12 11    Perceived Dyspnea (Exercise) 2 -- 2 1 2    Duration Continue with 30 min of aerobic exercise without signs/symptoms of physical distress. -- Continue with 30 min of aerobic exercise without signs/symptoms of physical distress. Continue with 30 min of aerobic exercise without signs/symptoms of physical distress. Continue with 30 min of aerobic exercise without signs/symptoms of physical distress.   Intensity THRR unchanged -- THRR unchanged THRR unchanged THRR unchanged  Progression   Progression Continue to progress workloads to maintain intensity without signs/symptoms of physical distress. -- Continue to progress workloads to maintain  intensity without signs/symptoms of physical distress. Continue to progress workloads to maintain intensity without signs/symptoms of physical distress. Continue to progress workloads to maintain intensity without signs/symptoms of physical distress.     Resistance Training   Training Prescription -- -- Yes Yes Yes   Weight -- -- 3 3 3    Reps -- -- 10-15 10-15 10-15     NuStep   Level -- -- 2 2 2    SPM -- -- 97 93 81   Minutes -- -- 25 25 25    METs -- -- 2 2 2      Track   Laps -- -- 8 9 6    Minutes -- -- 5 5 5    METs -- -- 1 1 1      Home Exercise Plan   Plans to continue exercise at -- Home (comment) Home (comment) Home (comment) Home (comment)   Frequency -- Add 2 additional days to program exercise sessions. Add 2 additional days to program exercise sessions. Add 2 additional days to program exercise sessions. Add 2 additional days to program exercise sessions.   Initial Home Exercises Provided -- 03/18/24 -- -- --    Row Name 04/22/24 1300             Response to Exercise   Blood Pressure (Admit) 132/64       Blood Pressure (Exercise) 150/68       Blood Pressure (Exit) 130/60       Heart Rate (Admit) 47 bpm       Heart Rate (Exercise) 75 bpm       Heart Rate (Exit) 60 bpm       Oxygen  Saturation (Admit) 90 %       Oxygen  Saturation (Exercise) 88 %       Oxygen  Saturation (Exit) 91 %       Rating of Perceived Exertion (Exercise) 13       Perceived Dyspnea (Exercise) 2       Duration Continue with 30 min of aerobic exercise without signs/symptoms of physical distress.       Intensity THRR unchanged         Progression   Progression Continue to progress workloads to maintain intensity without signs/symptoms of physical distress.         Resistance Training   Training Prescription Yes       Weight 3       Reps 10-15         NuStep   Level 2       SPM 78       Minutes 25       METs 2          Exercise Comments:   Exercise Comments     Row Name 01/27/24  9076 05/01/24 0927         Exercise Comments First full day of exercise!  Patient was oriented to gym and equipment including functions, settings, policies, and procedures.  Patient's individual exercise prescription and treatment plan were reviewed.  All starting workloads were established based on the results of the 6 minute walk test done at initial orientation visit.  The plan for exercise progression was also introduced and progression will be customized based on patient's performance and goals. Armanii graduated today from  rehab with 36 sessions completed.  Details of the patient's exercise prescription  and what He needs to do in order to continue the prescription and progress were discussed with patient.  Patient was given a copy of prescription and goals.  Patient verbalized understanding. Nashid plans to continue to exercise by exercising at home.         Exercise Goals and Review:   Exercise Goals     Row Name 01/14/24 1333             Exercise Goals   Increase Physical Activity Yes       Intervention Provide advice, education, support and counseling about physical activity/exercise needs.;Develop an individualized exercise prescription for aerobic and resistive training based on initial evaluation findings, risk stratification, comorbidities and participant's personal goals.       Expected Outcomes Short Term: Attend rehab on a regular basis to increase amount of physical activity.;Long Term: Add in home exercise to make exercise part of routine and to increase amount of physical activity.;Long Term: Exercising regularly at least 3-5 days a week.       Increase Strength and Stamina Yes       Intervention Provide advice, education, support and counseling about physical activity/exercise needs.;Develop an individualized exercise prescription for aerobic and resistive training based on initial evaluation findings, risk stratification, comorbidities and participant's personal goals.        Expected Outcomes Short Term: Increase workloads from initial exercise prescription for resistance, speed, and METs.;Short Term: Perform resistance training exercises routinely during rehab and add in resistance training at home;Long Term: Improve cardiorespiratory fitness, muscular endurance and strength as measured by increased METs and functional capacity ( )       Able to understand and use rate of perceived exertion (RPE) scale Yes       Intervention Provide education and explanation on how to use RPE scale       Expected Outcomes Short Term: Able to use RPE daily in rehab to express subjective intensity level;Long Term:  Able to use RPE to guide intensity level when exercising independently       Able to understand and use Dyspnea scale Yes       Intervention Provide education and explanation on how to use Dyspnea scale       Expected Outcomes Short Term: Able to use Dyspnea scale daily in rehab to express subjective sense of shortness of breath during exertion;Long Term: Able to use Dyspnea scale to guide intensity level when exercising independently       Knowledge and understanding of Target Heart Rate Range (THRR) Yes       Intervention Provide education and explanation of THRR including how the numbers were predicted and where they are located for reference       Expected Outcomes Short Term: Able to use daily as guideline for intensity in rehab;Long Term: Able to use THRR to govern intensity when exercising independently;Short Term: Able to state/look up THRR       Able to check pulse independently Yes       Intervention Provide education and demonstration on how to check pulse in carotid and radial arteries.;Review the importance of being able to check your own pulse for safety during independent exercise       Expected Outcomes Short Term: Able to explain why pulse checking is important during independent exercise;Long Term: Able to check pulse independently and accurately        Understanding of Exercise Prescription Yes       Intervention Provide education, explanation, and written materials on  patient's individual exercise prescription       Expected Outcomes Short Term: Able to explain program exercise prescription;Long Term: Able to explain home exercise prescription to exercise independently          Exercise Goals Re-Evaluation :  Exercise Goals Re-Evaluation     Row Name 01/27/24 [phone] 02/28/24 0934 03/18/24 1142 04/14/24 0810       Exercise Goal Re-Evaluation   Exercise Goals Review Able to understand and use Dyspnea scale;Understanding of Exercise Prescription;Able to understand and use rate of perceived exertion (RPE) scale;Knowledge and understanding of Target Heart Rate Range (THRR) Increase Strength and Stamina;Able to understand and use Dyspnea scale;Able to check pulse independently;Able to understand and use rate of perceived exertion (RPE) scale;Increase Physical Activity Increase Physical Activity;Increase Strength and Stamina;Understanding of Exercise Prescription Increase Physical Activity;Increase Strength and Stamina    Comments Reviewed RPE and dyspnea scale, THR and program prescription with pt today.  Pt voiced understanding and was given a copy of goals to take home. Jin states he has a machine at home that he uses that onyl works his legs that he uses 2 times a day for 10 mins at a time at a low speed. He wishes he could walk some more outside but states that his knees bother him when he walks for a while. Lucciano is doing well in rehab. He stated that it is getting a little eaiser to do the nustep at level 2. It is tougher foe him when he walks the track but is increasing his laps. He stated that he does feel like his endurance has improved some. He talked about coming to [facility] when he is done with hte program Eisen has been doing great in the program. He is on level 2 on the NuStep and walks the track for a warmup everyday. The track is harder for  him he states, but he pushes through to do as many laps as he can. He has mentioned coming to maintenance when finishing the program.    Expected Outcomes Short: Use RPE daily to regulate intensity.  Long: Follow program prescription in THR. Short: Continue to attend rehab. Long: Strengthen knees and legs so he can enjoy walking more. Short: increase levle on the nustep to 3   long: continue to come to rehab Short: Increase laps on the track. Long: Incorporate more home exercise.       Discharge Exercise Prescription (Final Exercise Prescription Changes):  Exercise Prescription Changes - 04/22/24 1300       Response to Exercise   Blood Pressure (Admit) 132/64    Blood Pressure (Exercise) 150/68    Blood Pressure (Exit) 130/60    Heart Rate (Admit) 47 bpm    Heart Rate (Exercise) 75 bpm    Heart Rate (Exit) 60 bpm    Oxygen  Saturation (Admit) 90 %    Oxygen  Saturation (Exercise) 88 %    Oxygen  Saturation (Exit) 91 %    Rating of Perceived Exertion (Exercise) 13    Perceived Dyspnea (Exercise) 2    Duration Continue with 30 min of aerobic exercise without signs/symptoms of physical distress.    Intensity THRR unchanged      Progression   Progression Continue to progress workloads to maintain intensity without signs/symptoms of physical distress.      Resistance Training   Training Prescription Yes    Weight 3    Reps 10-15      NuStep   Level 2    SPM 78  Minutes 25    METs 2          Nutrition:  Target Goals: Understanding of nutrition guidelines, daily intake of sodium 1500mg , cholesterol 200mg , calories 30% from fat and 7% or less from saturated fats, daily to have 5 or more servings of fruits and vegetables.  Biometrics:  Pre Biometrics - 01/23/24 1450       Pre Biometrics   Height 5' 4 (1.626 m)    Weight 78 kg    Waist Circumference 40 inches    Hip Circumference 41 inches    Waist to Hip Ratio 0.98 %    BMI (Calculated) 29.5    Grip Strength 10.4 kg           Post Biometrics - 04/22/24 0949        Post  Biometrics   Height 5' 4 (1.626 m)    Weight 81.5 kg    Waist Circumference 40 inches    Hip Circumference 41 inches    Waist to Hip Ratio 0.98 %    BMI (Calculated) 30.83    Grip Strength 14 kg          Nutrition Therapy Plan and Nutrition Goals:   Nutrition Assessments:  MEDIFICTS Score Key: >=70 Need to make dietary changes  40-70 Heart Healthy Diet <= 40 Therapeutic Level Cholesterol Diet  Flowsheet Row PULMONARY REHAB OTHER RESPIRATORY from 04/29/2024 in [facility] CARD[facility]AC REH[facility]ILITATION  Picture Your Plate Total Score on Discharge 50   Picture Your Plate Scores: <59 Unhealthy dietary pattern with much room for improvement. 41-50 Dietary pattern unlikely to meet recommendations for good health and room for improvement. 51-60 More healthful dietary pattern, with some room for improvement.  >60 Healthy dietary pattern, although there may be some specific behaviors that could be improved.    Nutrition Goals Re-Evaluation:  Nutrition Goals Re-Evaluation     Row Name 02/28/24 0929 03/18/24 1147 04/14/24 0806         Goals   Current Weight 171 lb 14.4 oz (78 kg) -- --     Nutrition Goal Continue to eat healthy. Continue to eat healthy. Healthy eating.     Comment Chidera states that he is eating very healthy. States that he has quit going out to eat and his wife is making all their meals at home. States the grease in restaurants was making him sick. He is drinking enough water as well. Keyden stated thatt he is trying to stick to a healhty diet. He eatsmostly fish ( salmon and white fish), he eats chicken but sais that he gets tired of it faster. He does not eat a lot of beef and likes to sub for ground mal[place] and [place] bacon. He drinks mostly water and tea and also does ginger ale sometimes. He is not much of a sweets person but does like milkshakes (vanilla). Devonne states that he is doing well following a healthy diet. He  doesn't eat many sweets but when he does he will splurge and have a milkshake.     Expected Outcome Short: Continue to attend rehab. Long: Continue to follow a healthy diet Short: watch portion control long: continue to follow healhty diet Short: Continue to follow healthy eating. Long: Have sweets in moderation.        Nutrition Goals Discharge (Final Nutrition Goals Re-Evaluation):  Nutrition Goals Re-Evaluation - 04/14/24 0806       Goals   Nutrition Goal Healthy eating.    Comment Cj states that  he is doing well following a healthy diet. He doesn't eat many sweets but when he does he will splurge and have a milkshake.    Expected Outcome Short: Continue to follow healthy eating. Long: Have sweets in moderation.          Psychosocial: Target Goals: Acknowledge presence or absence of significant depression and/or stress, maximize coping skills, provide positive support system. Participant is able to verbalize types and ability to use techniques and skills needed for reducing stress and depression.  Initial Review & Psychosocial Screening:  Initial Psych Review & Screening - 01/14/24 1305       Initial Review   Current issues with None Identified      Family Dynamics   Good Support System? Yes   Wife lives with him.     Barriers   Psychosocial barriers to participate in program There are no identifiable barriers or psychosocial needs.      Screening Interventions   Interventions Encouraged to exercise    Expected Outcomes Short Term goal: Utilizing psychosocial counselor, staff and physician to assist with identification of specific Stressors or current issues interfering with healing process. Setting desired goal for each stressor or current issue identified.;Long Term Goal: Stressors or current issues are controlled or eliminated.;Short Term goal: Identification and review with participant of any Quality of Life or Depression concerns found by scoring the questionnaire.;Long  Term goal: The participant improves quality of Life and PHQ9 Scores as seen by post scores and/or verbalization of changes          Quality of Life Scores:  Scores of 19 and below usually indicate a poorer quality of life in these areas.  A difference of  2-3 points is a clinically meaningful difference.  A difference of 2-3 points in the total score of the Quality of Life Index has been associated with significant improvement in overall quality of life, self-image, physical symptoms, and general health in studies assessing change in quality of life.   PHQ-9: Review Flowsheet       04/29/2024 01/23/2024  Depression screen PHQ 2/9  Decreased Interest 0 0  Down, Depressed, Hopeless 0 0  PHQ - 2 Score 0 0  Altered sleeping 0 0  Tired, decreased energy 1 0  Change in appetite 0 0  Feeling bad or failure about yourself  0 0  Trouble concentrating 0 0  Moving slowly or fidgety/restless 0 0  Suicidal thoughts 0 0  PHQ-9 Score 1 0  Difficult doing work/chores Not difficult at all -   Interpretation of Total Score  Total Score Depression Severity:  1-4 = Minimal depression, 5-9 = Mild depression, 10-14 = Moderate depression, 15-19 = Moderately severe depression, 20-27 = Severe depression   Psychosocial Evaluation and Intervention:  Psychosocial Evaluation - 01/14/24 1308       Psychosocial Evaluation & Interventions   Interventions Relaxation education;Encouraged to exercise with the program and follow exercise prescription    Comments Gilmar is a pleasant gentleman who is coming to rehab for a diagnosis of pulmonary HTN. He was recently hospitalized on 1/25 with PNA where he got discharged with O2, but hasn't used it in the last little while as he states he doesn't need it. Daren uses a cane for ambulation, which he says he doesn't need it but because his wife wants him to use it for safety reasons. He lives at home with his wife and dog who are his support system. 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 He denies any current stressors in life. His current exercise consists of what the home PT tells him to do, which he says he has 2 more sessions of that left. Bryston is eager to come exercise to get his strenth back.    Expected Outcomes Short: Increase strength and stamina. Long: Be able to walk without a cane.    Continue Psychosocial Services  Follow up required by staff          Psychosocial Re-Evaluation:  Psychosocial Re-Evaluation     Row Name 02/28/24 9072 03/18/24 1146 04/14/24 0759         Psychosocial Re-Evaluation   Current issues with None Identified None Identified Current Stress Concerns     Comments Rayjon is doing well in rehab. He states he does not have any stressors going on in his life and he enjoys coming here. His wife is his biggest support system and stays on him about his health. Vaughn is doing well in rehab. He stated that he does not have much stress and just lets everything roll off. He likes coming to rehab and enjoys the social aspect as well. Burton is doing well in rehab. He made the decision a few weeks ago to start coming M,W,F instead of two days a week. It has really increased his stamina and he enjoys the social aspect of rehab. Durward has been a little stressed about his doctors wanting to potentially put him on an iron infusion soon as he is nervous about it, and he said they would make him stop coming to rehab if he got it. So far he is just taking an iron pill until he absolutely has to get the infusion.     Expected Outcomes Short: Continue to attend rehab. Long: Continue to have stress free techniques. Short: Continue to attend rehab. Long: Continue to have stress free techniques. Short: Continue to attend rehab. Long: Discuss with doctors the best treatment for his low hemoglobin that do not make him stressed.     Interventions Encouraged to attend Pulmonary Rehabilitation for the exercise Encouraged  to attend Pulmonary Rehabilitation for the exercise Encouraged to attend Pulmonary Rehabilitation for the exercise     Continue Psychosocial Services  Follow up required by staff Follow up required by staff Follow up required by staff        Psychosocial Discharge (Final Psychosocial Re-Evaluation):  Psychosocial Re-Evaluation - 04/14/24 0759       Psychosocial Re-Evaluation   Current issues with Current Stress Concerns    Comments Azhar is doing well in rehab. He made the decision a few weeks ago to start coming M,W,F instead of two days a week. It has really increased his stamina and he enjoys the social aspect of rehab. Bayley has been a little stressed about his doctors wanting to potentially put him on an iron infusion soon as he is nervous about it, and he said they would make him stop coming to rehab if he got it. So far he is just taking an iron pill until he absolutely has to get the infusion.    Expected Outcomes Short: Continue to attend rehab. Long: Discuss with doctors the best treatment for his low hemoglobin that do not make him stressed.    Interventions Encouraged to attend Pulmonary Rehabilitation for the exercise    Continue Psychosocial Services  Follow up required by staff           Education:  Education Goals: Education classes will be provided on a weekly basis, covering required topics. Participant will state understanding/return demonstration of topics presented.  Learning Barriers/Preferences:  Learning Barriers/Preferences - 01/14/24 1308       Learning Barriers/Preferences   Learning Barriers Sight   Wears glasses   Learning Preferences None          Education Topics: How Lungs Work and Diseases: - Discuss the anatomy of the lungs and diseases that can affect the lungs, such as COPD. Flowsheet Row PULMONARY REHAB OTHER RESPIRATORY from 04/29/2024 in [facility] [facility]  Date 03/25/24  Educator hb  Instruction Review Code 1- Verbalizes  Understanding    Exercise: -Discuss the importance of exercise, FITT principles of exercise, normal and abnormal responses to exercise, and how to exercise safely.   Environmental Irritants: -Discuss types of environmental irritants and how to limit exposure to environmental irritants.   Meds/Inhalers and oxygen : - Discuss respiratory medications, definition of an inhaler and oxygen , and the proper way to use an inhaler and oxygen .   Energy Saving Techniques: - Discuss methods to conserve energy and decrease shortness of breath when performing activities of daily living.  Flowsheet Row PULMONARY REHAB OTHER RESPIRATORY from 04/29/2024 in [place] [facility]  Date 04/15/24  Educator HB  Instruction Review Code 1- Verbalizes Understanding    Bronchial Hygiene / Breathing Techniques: - Discuss breathing mechanics, pursed-lip breathing technique,  proper posture, effective ways to clear airways, and other functional breathing techniques   Cleaning Equipment: - Provides group verbal and written instruction about the health risks of elevated stress, cause of high stress, and healthy ways to reduce stress.   Nutrition I: Fats: - Discuss the types of cholesterol, what cholesterol does to the body, and how cholesterol levels can be controlled. Flowsheet Row PULMONARY REHAB OTHER RESPIRATORY from 04/29/2024 in [place] [facility]  Date 04/29/24  Educator jh  Instruction Review Code 1- Verbalizes Understanding    Nutrition II: Labels: -Discuss the different components of food labels and how to read food labels. Flowsheet Row PULMONARY REHAB OTHER RESPIRATORY from 04/29/2024 in [place] [facility]  Date 04/29/24  Educator jh  Instruction Review Code 1- Verbalizes Understanding    Respiratory Infections: - Discuss the signs and symptoms of respiratory infections, ways to prevent respiratory infections, and the importance of seeking medical  treatment when having a respiratory infection.   Stress I: Signs and Symptoms: - Discuss the causes of stress, how stress may lead to anxiety and depression, and ways to limit stress. Flowsheet Row PULMONARY REHAB OTHER RESPIRATORY from 04/29/2024 in [place] [facility]  Date 03/04/24  Educator [facility]  Instruction Review Code 1- Verbalizes Understanding    Stress II: Relaxation: -Discuss relaxation techniques to limit stress. Flowsheet Row PULMONARY REHAB OTHER RESPIRATORY from 04/29/2024 in [place] [facility]  Date 03/04/24  Educator [facility]  Instruction Review Code 1- Verbalizes Understanding    Oxygen  for Home/Travel: - Discuss how to prepare for travel when on oxygen  and proper ways to transport and store oxygen  to ensure safety.   Knowledge Questionnaire Score:  Knowledge Questionnaire Score - 04/29/24 0924       Knowledge Questionnaire Score   Post Score 14/18          Core Components/Risk Factors/Patient Goals at Admission:  Personal Goals and Risk Factors at Admission - 01/14/24 1358       Core Components/Risk Factors/Patient Goals on Admission    Weight Management Yes  Intervention Weight Management: Develop a combined nutrition and exercise program designed to reach desired caloric intake, while maintaining appropriate intake of nutrient and fiber, sodium and fats, and appropriate energy expenditure required for the weight goal.;Weight Management: Provide education and appropriate resources to help participant work on and attain dietary goals.;Weight Management/Obesity: Establish reasonable short term and long term weight goals.;Obesity: Provide education and appropriate resources to help participant work on and attain dietary goals.    Expected Outcomes Short Term: Continue to assess and modify interventions until short term weight is achieved;Long Term: Adherence to nutrition and physical activity/exercise program aimed toward attainment of  established weight goal;Weight Maintenance: Understanding of the daily nutrition guidelines, which includes 25-35% calories from fat, 7% or less cal from saturated fats, less than 200mg  cholesterol, less than 1.5gm of sodium, & 5 or more servings of fruits and vegetables daily;Understanding recommendations for meals to include 15-35% energy as protein, 25-35% energy from fat, 35-60% energy from carbohydrates, less than 200mg  of dietary cholesterol, 20-35 gm of total fiber daily    Improve shortness of breath with ADL's Yes    Intervention Provide education, individualized exercise plan and daily activity instruction to help decrease symptoms of SOB with activities of daily living.    Expected Outcomes Short Term: Improve cardiorespiratory fitness to achieve a reduction of symptoms when performing ADLs;Long Term: Be able to perform more ADLs without symptoms or delay the onset of symptoms    Increase knowledge of respiratory medications and ability to use respiratory devices properly  Yes    Intervention Provide education and demonstration as needed of appropriate use of medications, inhalers, and oxygen  therapy.    Expected Outcomes Short Term: Achieves understanding of medications use. Understands that oxygen  is a medication prescribed by physician. Demonstrates appropriate use of inhaler and oxygen  therapy.;Long Term: Maintain appropriate use of medications, inhalers, and oxygen  therapy.    Diabetes Yes    Intervention Provide education about proper nutrition, including hydration, and aerobic/resistive exercise prescription along with prescribed medications to achieve blood glucose in normal ranges: Fasting glucose 65-99 mg/dL;Provide education about signs/symptoms and action to take for hypo/hyperglycemia.    Expected Outcomes Short Term: Participant verbalizes understanding of the signs/symptoms and immediate care of hyper/hypoglycemia, proper foot care and importance of medication, aerobic/resistive  exercise and nutrition plan for blood glucose control.;Long Term: Attainment of HbA1C < 7%.    Hypertension Yes    Intervention Monitor prescription use compliance.;Provide education on lifestyle modifcations including regular physical activity/exercise, weight management, moderate sodium restriction and increased consumption of fresh fruit, vegetables, and low fat dairy, alcohol  moderation, and smoking cessation.    Expected Outcomes Short Term: Continued assessment and intervention until BP is < 140/36mm HG in hypertensive participants. < 130/26mm HG in hypertensive participants with diabetes, heart failure or chronic kidney disease.;Long Term: Maintenance of blood pressure at goal levels.    Lipids Yes    Intervention Provide education and support for participant on nutrition & aerobic/resistive exercise along with prescribed medications to achieve LDL 70mg , HDL >40mg .    Expected Outcomes Short Term: Participant states understanding of desired cholesterol values and is compliant with medications prescribed. Participant is following exercise prescription and nutrition guidelines.;Long Term: Cholesterol controlled with medications as prescribed, with individualized exercise RX and with personalized nutrition plan. Value goals: LDL < 70mg , HDL > 40 mg.          Core Components/Risk Factors/Patient Goals Review:   Goals and Risk Factor Review     Row Name  02/28/24 0931 03/18/24 1152 04/14/24 0808         Core Components/Risk Factors/Patient Goals Review   Personal Goals Review Lipids;Diabetes;Improve shortness of breath with ADL's;Hypertension Lipids;Diabetes;Improve shortness of breath with ADL's;Hypertension Lipids;Diabetes;Improve shortness of breath with ADL's;Hypertension     Review Arash feels that his breathing has improved significantly since starting the program. He states that his wife checks his BP and O2 at home and it runs normal. Also says his blood sugars have been running WNL as  well. His wife is his great support system and stays on him about his health. Alcario is doing great in rehab. He said that his breathing has improved and he is enjoying coming to the program. He does check his blood pressure everyday. He also checks his sugar daily and has been WNL. Javid is doing well in the program. He always starts everyday with a warm up on the track. He usually gets around 10 laps but lately he has been getting around 5-8 laps. His oxygen  normally runs in the low 90's, and sometimes when he first gets here it will be 87-88% until he gets settled and does his PLB. He checks his blood pressure and blood sugar regularly at home.     Expected Outcomes Short: Continue to use PLB and other breathing techniques to improve breathing. Long: Continue to check vitals at home and report to health care professionals. Short: Continue to use PLB and other breathing techniques to improve breathing. Long: Continue to check vitals at home and report to health care professionals. Short: Continue to use PLB and other breathing techniques to improve breathing. Long: Continue to check vitals at home and report to health care professionals.        Core Components/Risk Factors/Patient Goals at Discharge (Final Review):   Goals and Risk Factor Review - 04/14/24 0808       Core Components/Risk Factors/Patient Goals Review   Personal Goals Review Lipids;Diabetes;Improve shortness of breath with ADL's;Hypertension    Review Sloane is doing well in the program. He always starts everyday with a warm up on the track. He usually gets around 10 laps but lately he has been getting around 5-8 laps. His oxygen  normally runs in the low 90's, and sometimes when he first gets here it will be 87-88% until he gets settled and does his PLB. He checks his blood pressure and blood sugar regularly at home.    Expected Outcomes Short: Continue to use PLB and other breathing techniques to improve breathing. Long: Continue to check  vitals at home and report to health care professionals.          ITP Comments:  ITP Comments     Row Name 01/14/24 1316 01/24/24 0812 01/27/24 0923 02/12/24 0845 03/11/24 0823   ITP Comments Completed virtual orientation today.  EP evaluation is scheduled for 01/23/24 at 1:30pm .  Documentation for diagnosis can be found in [facility] encounter 12/18/23. Patient arrived for 1st visit/orientation/education at 1315. Patient was referred to PR by Ezra Shuck, MD due to Pulmonary HTN. During orientation advised patient on arrival and appointment times what to wear, what to do before, during and after exercise. Reviewed attendance and class policy.  Pt is scheduled to return Pulmonary Rehab on 01/27/24 at 915. Pt was advised to come to class 15 minutes before class starts.  Discussed RPE/Dpysnea scales. Patient participated in warm up stretches. Patient was able to complete 6 minute walk test.  Patient was measured for the equipment. Discussed equipment  safety with patient. Took patient pre-anthropometric measurements. Patient finished visit at 1415. First full day of exercise!  Patient was oriented to gym and equipment including functions, settings, policies, and procedures.  Patient's individual exercise prescription and treatment plan were reviewed.  All starting workloads were established based on the results of the 6 minute walk test done at initial orientation visit.  The plan for exercise progression was also introduced and progression will be customized based on patient's performance and goals. 30 day review completed. ITP sent to Dr.Jehanzeb Memon, Medical Director of  Pulmonary Rehab. Continue with ITP unless changes are made by physician.  Newer to program 30 day review completed. ITP sent to Dr.Jehanzeb Memon, Medical Director of  Pulmonary Rehab. Continue with ITP unless changes are made by physician.    Row Name 04/08/24 0800 05/01/24 0927         ITP Comments 30 day review completed. ITP sent to  Dr.Jehanzeb Memon, Medical Director of Pulmonary Rehab. Continue with ITP unless changes are made by physician Masson graduated today from  rehab with 36 sessions completed.  Details of the patient's exercise prescription and what He needs to do in order to continue the prescription and progress were discussed with patient.  Patient was given a copy of prescription and goals.  Patient verbalized understanding. Connelly plans to continue to exercise by exercising at home.         Comments: Discharge ITP

## 2024-05-01 NOTE — Progress Notes (Signed)
 Discharge Progress Report  Patient Details  Name: Robert Baldwin MRN: [id number] Date of Birth: 09-19-1941 Referring Provider:   Flowsheet Row PULMONARY REHAB OTHER RESP ORIENTATION from 01/23/2024 in [facility] CARDIAC REHABILITATION  Referring Provider Rolan Barrack MD     Number of Visits: 36  Reason for Discharge:  Patient reached a stable level of exercise. Patient independent in their exercise. Patient has met program and personal goals.  Smoking History:  Social History   Tobacco Use  Smoking Status Never   Passive exposure: Never  Smokeless Tobacco Never  Tobacco Comments   Never smoke 05/18/22    Diagnosis:  Pulmonary hypertension (HCC)  ADL UCSD:  Pulmonary Assessment Scores     Row Name 01/23/24 1520 04/29/24 0924       ADL UCSD   ADL Phase Entry Exit    SOB Score total 23 21    Rest 0 1    Walk 3 2    Stairs 3 3    Bath 2 2    Dress 2 2    Shop 0 0      CAT Score   CAT Score 10 --      mMRC Score   mMRC Score 2 --       Initial Exercise Prescription:  Initial Exercise Prescription - 01/23/24 1400       Date of Initial Exercise RX and Referring Provider   Date 01/23/24    Referring Provider Rolan Barrack MD      Oxygen    Maintain Oxygen  Saturation 88% or higher      NuStep   Level 2    SPM 60    Minutes 15    METs 1.9      Track   Laps 15    Minutes 15    METs 1.9      Prescription Details   Frequency (times per week) 2    Duration Progress to 30 minutes of continuous aerobic without signs/symptoms of physical distress      Intensity   THRR 40-80% of Max Heartrate 81-119    Ratings of Perceived Exertion 11-13    Perceived Dyspnea 0-4      Resistance Training   Training Prescription Yes    Weight 3    Reps 10-15          Discharge Exercise Prescription (Final Exercise Prescription Changes):  Exercise Prescription Changes - 04/22/24 1300       Response to Exercise   Blood Pressure (Admit) 132/64    Blood  Pressure (Exercise) 150/68    Blood Pressure (Exit) 130/60    Heart Rate (Admit) 47 bpm    Heart Rate (Exercise) 75 bpm    Heart Rate (Exit) 60 bpm    Oxygen  Saturation (Admit) 90 %    Oxygen  Saturation (Exercise) 88 %    Oxygen  Saturation (Exit) 91 %    Rating of Perceived Exertion (Exercise) 13    Perceived Dyspnea (Exercise) 2    Duration Continue with 30 min of aerobic exercise without signs/symptoms of physical distress.    Intensity THRR unchanged      Progression   Progression Continue to progress workloads to maintain intensity without signs/symptoms of physical distress.      Resistance Training   Training Prescription Yes    Weight 3    Reps 10-15      NuStep   Level 2    SPM 78    Minutes 25    METs  2          Functional Capacity:  6 Minute Walk     Row Name 01/23/24 1445 04/22/24 0947       6 Minute Walk   Phase Initial Discharge    Distance 860 feet 760 feet    Walk Time 6 minutes 6 minutes    # of Rest Breaks 0 2    MPH 1.63 1.44    METS 1.29 0.93    RPE 12 13    Perceived Dyspnea  2 2    VO2 Peak 4.52 3.27    Symptoms No Yes (comment)    Comments -- two breaks due to fatigue    Resting HR 73 bpm 47 bpm    Resting BP 180/80 132/64    Resting Oxygen  Saturation  90 % 90 %    Exercise Oxygen  Saturation  during 6 min walk 85 % 96 %    Max Ex. HR 78 bpm 75 bpm    Max Ex. BP 164/70 150/68    2 Minute Post BP 148/70 136/64      Interval HR   1 Minute HR 66 60    2 Minute HR 68 65    3 Minute HR 70 75    4 Minute HR 71 75    5 Minute HR 76 74    6 Minute HR 78 70    2 Minute Post HR 53 62    Interval Heart Rate? Yes Yes      Interval Oxygen    Interval Oxygen ? Yes Yes    Baseline Oxygen  Saturation % 90 % 90 %    1 Minute Oxygen  Saturation % 86 % 89 %    1 Minute Liters of Oxygen  0 L 0 L    2 Minute Oxygen  Saturation % 86 % 86 %    2 Minute Liters of Oxygen  0 L 0 L    3 Minute Oxygen  Saturation % 86 % 87 %    3 Minute Liters of Oxygen  0 L  0 L    4 Minute Oxygen  Saturation % 86 % 86 %    4 Minute Liters of Oxygen  0 L 0 L    5 Minute Oxygen  Saturation % 85 % 87 %    5 Minute Liters of Oxygen  0 L 0 L    6 Minute Oxygen  Saturation % 86 % 88 %    6 Minute Liters of Oxygen  0 L 0 L    2 Minute Post Oxygen  Saturation % 91 % 90 %    2 Minute Post Liters of Oxygen  0 L 0 L       Psychological, QOL, Others - Outcomes: PHQ 2/9:    04/29/2024    9:25 AM 01/23/2024    3:19 PM  Depression screen PHQ 2/9  Decreased Interest 0 0  Down, Depressed, Hopeless 0 0  PHQ - 2 Score 0 0  Altered sleeping 0 0  Tired, decreased energy 1 0  Change in appetite 0 0  Feeling bad or failure about yourself  0 0  Trouble concentrating 0 0  Moving slowly or fidgety/restless 0 0  Suicidal thoughts 0 0  PHQ-9 Score 1 0  Difficult doing work/chores Not difficult at all    Nutrition & Weight - Outcomes:  Pre Biometrics - 01/23/24 1450       Pre Biometrics   Height 5' 4 (1.626 m)    Weight 78 kg    Waist Circumference 40  inches    Hip Circumference 41 inches    Waist to Hip Ratio 0.98 %    BMI (Calculated) 29.5    Grip Strength 10.4 kg          Post Biometrics - 04/22/24 0949        Post  Biometrics   Height 5' 4 (1.626 m)    Weight 81.5 kg    Waist Circumference 40 inches    Hip Circumference 41 inches    Waist to Hip Ratio 0.98 %    BMI (Calculated) 30.83    Grip Strength 14 kg          Nutrition:   Nutrition Discharge:   Education Questionnaire Score:  Knowledge Questionnaire Score - 04/29/24 0924       Knowledge Questionnaire Score   Post Score 14/18          Goals reviewed with patient; copy given to patient.

## 2024-05-04 ENCOUNTER — Encounter (HOSPITAL_COMMUNITY)

## 2024-05-05 DIAGNOSIS — E103313 Type 1 diabetes mellitus with moderate nonproliferative diabetic retinopathy with macular edema, bilateral: Secondary | ICD-10-CM | POA: Diagnosis not present

## 2024-05-06 ENCOUNTER — Encounter (HOSPITAL_COMMUNITY)

## 2024-05-08 ENCOUNTER — Encounter (HOSPITAL_COMMUNITY)

## 2024-05-11 ENCOUNTER — Encounter (HOSPITAL_COMMUNITY)

## 2024-05-12 ENCOUNTER — Ambulatory Visit: Attending: Cardiology | Admitting: Cardiology

## 2024-05-12 ENCOUNTER — Encounter: Payer: Self-pay | Admitting: Cardiology

## 2024-05-12 VITALS — BP 132/64 | HR 45 | Ht 66.0 in | Wt 179.8 lb

## 2024-05-12 DIAGNOSIS — I5032 Chronic diastolic (congestive) heart failure: Secondary | ICD-10-CM

## 2024-05-12 DIAGNOSIS — I6523 Occlusion and stenosis of bilateral carotid arteries: Secondary | ICD-10-CM

## 2024-05-12 DIAGNOSIS — I4821 Permanent atrial fibrillation: Secondary | ICD-10-CM

## 2024-05-12 DIAGNOSIS — N1832 Chronic kidney disease, stage 3b: Secondary | ICD-10-CM

## 2024-05-12 DIAGNOSIS — I272 Pulmonary hypertension, unspecified: Secondary | ICD-10-CM | POA: Diagnosis not present

## 2024-05-12 MED ORDER — HYDRALAZINE HCL 25 MG PO TABS: 37.5000 mg | ORAL_TABLET | Freq: Three times a day (TID) | ORAL | Status: DC

## 2024-05-12 NOTE — Patient Instructions (Signed)

## 2024-05-12 NOTE — Progress Notes (Signed)
 Cardiology Office Note  Date: 05/12/2024   ID: Errick, Salts [birth year], MRN [id number]  History of Present Illness: Robert Baldwin is an 83 y.o. male last seen in March.  He had interval follow-up in the heart failure clinic in May, I reviewed the note.  He is here for a routine visit.  Just recently completed pulmonary rehabilitation and plans to do the maintenance program at [facility] twice a week.  He reports NYHA class II-III dyspnea depending on level of activity, no exertional chest pain, no palpitations or syncope.  Has fluctuating lower leg edema which has not increased, his weight is stable.  We went over his medications.  He continues on Coumadin  with follow-up by Dr. Maree.  He does not report any spontaneous bleeding problems.  No changes noted in cardiac regimen other than increasing hydralazine  made back in May.  Blood pressure is reasonable today.  I reviewed his most recent lab work, creatinine had improved somewhat down to 2.15 with GFR 30.  Physical Exam: VS:  BP 132/64   Pulse (!) 45   Ht 5' 6 (1.676 m)   Wt 179 lb 12.8 oz (81.6 kg)   SpO2 95%   BMI 29.02 kg/m , BMI Body mass index is 29.02 kg/m.  Wt Readings from Last 3 Encounters:  05/12/24 179 lb 12.8 oz (81.6 kg)  04/22/24 179 lb 10.8 oz (81.5 kg)  04/14/24 178 lb 4.8 oz (80.9 kg)    General: Patient appears comfortable at rest. HEENT: Conjunctiva and lids normal. Neck: Supple, no elevated JVP or carotid bruits. Lungs: Coarse breath sounds without wheezing, nonlabored breathing at rest. Cardiac: Irregularly irregular, 2/6 systolic murmur without gallop. Extremities: No pitting edema.  ECG:  An ECG dated 03/12/2024 was personally reviewed today and demonstrated:  Slow atrial fibrillation with right bundle branch block and nonspecific T wave abnormalities.  Labwork: 11/17/2023: ALT 41; AST 37 11/27/2023: Hemoglobin 9.7; Platelets 292 11/29/2023: Magnesium  2.7 03/12/2024: B Natriuretic Peptide  642.6 04/03/2024: BUN 74; Creatinine, Ser 2.15; Potassium 4.5; Sodium 142     Component Value Date/Time   CHOL 118 12/18/2023 0910   TRIG 45 12/18/2023 0910   HDL 59 12/18/2023 0910   CHOLHDL 2.0 12/18/2023 0910   VLDL 9 12/18/2023 0910   LDLCALC 50 12/18/2023 0910   Other Studies Reviewed Today:  Echocardiogram 11/19/2023:  1. Left ventricular ejection fraction, by estimation, is 65 to 70%. The  left ventricle has normal function. The left ventricle has no regional  wall motion abnormalities. Left ventricular diastolic parameters are  indeterminate.   2. Right ventricular systolic function is moderately reduced. The right  ventricular size is moderately enlarged. There is severely elevated  pulmonary artery systolic pressure.   3. Left atrial size was severely dilated.   4. Right atrial size was severely dilated.   5. A small pericardial effusion is present. The pericardial effusion is  circumferential.   6. The mitral valve is abnormal. No evidence of mitral valve  regurgitation. Mild mitral stenosis. The mean mitral valve gradient is 4.7  mmHg.Heart rate is 43 bpm.   7. The tricuspid valve is abnormal. Tricuspid valve regurgitation is  moderate.   8. The aortic valve was not well visualized. Aortic valve regurgitation  is not visualized. Mild aortic valve stenosis. Aortic valve area, by VTI  measures 2.34 cm. Aortic valve mean gradient measures 11.0 mmHg.   9. The inferior vena cava is dilated in size with <50% respiratory  variability,  suggesting right atrial pressure of 15 mmHg.   Assessment and Plan:  1.  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  Doing reasonably well, just completed pulmonary rehabilitation and plans to continue with the maintenance program.  NYHA class II-III symptoms, no progressive fluid retention, his weight is stable today.  Continue Farxiga  10 mg daily, Demadex  60 mg daily, KCl 10 mEq daily, Opsumit  10 mg daily, and  tadalafil  20 mg 2 tablets daily.   2.  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  Continues to follow with Dr. Gretta with [facility].  Carotid Dopplers in June indicated less than 50% RICA stenosis within the stent and 50 to 75% LICA stenosis with in-stent although in the setting of acoustic shadowing limiting accuracy.  He is asymptomatic.  Also on aspirin  81 mg daily.   3.  Permanent atrial fibrillation with CHA2DS2-VASc score 5.  Chronically slow heart rate, not obviously symptomatic.  He is not on any AV nodal blockers.  Continue Coumadin  for stroke prophylaxis with follow-up by PCP.   4.  OSA, intolerant of CPAP.   5.  CKD stage IIIb-IV.  Creatinine 2.15 with GFR 30.   6.  Mixed hyperlipidemia on Lipitor 40 mg daily.  LDL 50 in February.   7.  Primary hypertension.  Continue hydralazine  37.5 mg 3 times a day.  Disposition:  Follow up 6 months, has interval follow-up in the heart failure clinic in October.  Signed, Jayson JUDITHANN Sierras, M.D., F.A.C.C. [facility]

## 2024-05-13 ENCOUNTER — Encounter (HOSPITAL_COMMUNITY)

## 2024-05-14 DIAGNOSIS — I4891 Unspecified atrial fibrillation: Secondary | ICD-10-CM | POA: Diagnosis not present

## 2024-05-14 DIAGNOSIS — Z299 Encounter for prophylactic measures, unspecified: Secondary | ICD-10-CM | POA: Diagnosis not present

## 2024-05-14 DIAGNOSIS — I10 Essential (primary) hypertension: Secondary | ICD-10-CM | POA: Diagnosis not present

## 2024-05-14 DIAGNOSIS — I5032 Chronic diastolic (congestive) heart failure: Secondary | ICD-10-CM | POA: Diagnosis not present

## 2024-05-14 DIAGNOSIS — E1169 Type 2 diabetes mellitus with other specified complication: Secondary | ICD-10-CM | POA: Diagnosis not present

## 2024-05-15 ENCOUNTER — Encounter (HOSPITAL_COMMUNITY)

## 2024-05-18 ENCOUNTER — Encounter (HOSPITAL_COMMUNITY)

## 2024-05-20 ENCOUNTER — Encounter (HOSPITAL_COMMUNITY)

## 2024-05-22 ENCOUNTER — Encounter (HOSPITAL_COMMUNITY)

## 2024-05-25 ENCOUNTER — Encounter (HOSPITAL_COMMUNITY)

## 2024-05-25 DIAGNOSIS — C44311 Basal cell carcinoma of skin of nose: Secondary | ICD-10-CM | POA: Diagnosis not present

## 2024-05-27 ENCOUNTER — Other Ambulatory Visit (HOSPITAL_COMMUNITY): Payer: Self-pay | Admitting: Cardiology

## 2024-05-28 DIAGNOSIS — I50813 Acute on chronic right heart failure: Secondary | ICD-10-CM | POA: Diagnosis not present

## 2024-05-29 ENCOUNTER — Encounter (HOSPITAL_COMMUNITY)

## 2024-05-31 ENCOUNTER — Other Ambulatory Visit (HOSPITAL_COMMUNITY): Payer: Self-pay | Admitting: Cardiology

## 2024-06-02 ENCOUNTER — Other Ambulatory Visit (HOSPITAL_COMMUNITY): Payer: Self-pay | Admitting: Cardiology

## 2024-06-02 DIAGNOSIS — I270 Primary pulmonary hypertension: Secondary | ICD-10-CM

## 2024-06-15 DIAGNOSIS — I4891 Unspecified atrial fibrillation: Secondary | ICD-10-CM | POA: Diagnosis not present

## 2024-06-15 DIAGNOSIS — Z299 Encounter for prophylactic measures, unspecified: Secondary | ICD-10-CM | POA: Diagnosis not present

## 2024-06-15 DIAGNOSIS — E119 Type 2 diabetes mellitus without complications: Secondary | ICD-10-CM | POA: Diagnosis not present

## 2024-06-15 DIAGNOSIS — I10 Essential (primary) hypertension: Secondary | ICD-10-CM | POA: Diagnosis not present

## 2024-06-15 DIAGNOSIS — H6122 Impacted cerumen, left ear: Secondary | ICD-10-CM | POA: Diagnosis not present

## 2024-06-15 DIAGNOSIS — I5032 Chronic diastolic (congestive) heart failure: Secondary | ICD-10-CM | POA: Diagnosis not present

## 2024-06-15 DIAGNOSIS — N184 Chronic kidney disease, stage 4 (severe): Secondary | ICD-10-CM | POA: Diagnosis not present

## 2024-06-19 DIAGNOSIS — C44629 Squamous cell carcinoma of skin of left upper limb, including shoulder: Secondary | ICD-10-CM | POA: Diagnosis not present

## 2024-06-19 DIAGNOSIS — D485 Neoplasm of uncertain behavior of skin: Secondary | ICD-10-CM | POA: Diagnosis not present

## 2024-06-28 DIAGNOSIS — I50813 Acute on chronic right heart failure: Secondary | ICD-10-CM | POA: Diagnosis not present

## 2024-06-30 ENCOUNTER — Other Ambulatory Visit (HOSPITAL_COMMUNITY): Payer: Self-pay | Admitting: Family Medicine

## 2024-06-30 ENCOUNTER — Other Ambulatory Visit (HOSPITAL_COMMUNITY): Payer: Self-pay | Admitting: Cardiology

## 2024-07-06 DIAGNOSIS — D631 Anemia in chronic kidney disease: Secondary | ICD-10-CM | POA: Diagnosis not present

## 2024-07-06 DIAGNOSIS — N2581 Secondary hyperparathyroidism of renal origin: Secondary | ICD-10-CM | POA: Diagnosis not present

## 2024-07-06 DIAGNOSIS — N184 Chronic kidney disease, stage 4 (severe): Secondary | ICD-10-CM | POA: Diagnosis not present

## 2024-07-06 DIAGNOSIS — N189 Chronic kidney disease, unspecified: Secondary | ICD-10-CM | POA: Diagnosis not present

## 2024-07-06 DIAGNOSIS — E1122 Type 2 diabetes mellitus with diabetic chronic kidney disease: Secondary | ICD-10-CM | POA: Diagnosis not present

## 2024-07-06 DIAGNOSIS — I779 Disorder of arteries and arterioles, unspecified: Secondary | ICD-10-CM | POA: Diagnosis not present

## 2024-07-06 DIAGNOSIS — E785 Hyperlipidemia, unspecified: Secondary | ICD-10-CM | POA: Diagnosis not present

## 2024-07-06 DIAGNOSIS — I509 Heart failure, unspecified: Secondary | ICD-10-CM | POA: Diagnosis not present

## 2024-07-06 DIAGNOSIS — I130 Hypertensive heart and chronic kidney disease with heart failure and stage 1 through stage 4 chronic kidney disease, or unspecified chronic kidney disease: Secondary | ICD-10-CM | POA: Diagnosis not present

## 2024-07-07 LAB — LAB REPORT - SCANNED
Albumin, Urine POC: 127.9
Albumin/Creatinine Ratio, Urine, POC: 643
Creatinine, POC: 19.9 mg/dL

## 2024-07-09 DIAGNOSIS — I10 Essential (primary) hypertension: Secondary | ICD-10-CM | POA: Diagnosis not present

## 2024-07-09 DIAGNOSIS — K7460 Unspecified cirrhosis of liver: Secondary | ICD-10-CM | POA: Diagnosis not present

## 2024-07-09 DIAGNOSIS — Z23 Encounter for immunization: Secondary | ICD-10-CM | POA: Diagnosis not present

## 2024-07-09 DIAGNOSIS — N184 Chronic kidney disease, stage 4 (severe): Secondary | ICD-10-CM | POA: Diagnosis not present

## 2024-07-09 DIAGNOSIS — I4891 Unspecified atrial fibrillation: Secondary | ICD-10-CM | POA: Diagnosis not present

## 2024-07-09 DIAGNOSIS — Z299 Encounter for prophylactic measures, unspecified: Secondary | ICD-10-CM | POA: Diagnosis not present

## 2024-07-21 DIAGNOSIS — Z299 Encounter for prophylactic measures, unspecified: Secondary | ICD-10-CM | POA: Diagnosis not present

## 2024-07-21 DIAGNOSIS — K7460 Unspecified cirrhosis of liver: Secondary | ICD-10-CM | POA: Diagnosis not present

## 2024-07-21 DIAGNOSIS — S41119A Laceration without foreign body of unspecified upper arm, initial encounter: Secondary | ICD-10-CM | POA: Diagnosis not present

## 2024-07-21 DIAGNOSIS — I5032 Chronic diastolic (congestive) heart failure: Secondary | ICD-10-CM | POA: Diagnosis not present

## 2024-07-21 DIAGNOSIS — I10 Essential (primary) hypertension: Secondary | ICD-10-CM | POA: Diagnosis not present

## 2024-07-21 DIAGNOSIS — I4891 Unspecified atrial fibrillation: Secondary | ICD-10-CM | POA: Diagnosis not present

## 2024-07-22 ENCOUNTER — Other Ambulatory Visit (HOSPITAL_COMMUNITY): Payer: Self-pay | Admitting: Cardiology

## 2024-08-05 ENCOUNTER — Encounter: Payer: Self-pay | Admitting: Internal Medicine

## 2024-08-10 DIAGNOSIS — Z0000 Encounter for general adult medical examination without abnormal findings: Secondary | ICD-10-CM | POA: Diagnosis not present

## 2024-08-10 DIAGNOSIS — Z1339 Encounter for screening examination for other mental health and behavioral disorders: Secondary | ICD-10-CM | POA: Diagnosis not present

## 2024-08-10 DIAGNOSIS — Z299 Encounter for prophylactic measures, unspecified: Secondary | ICD-10-CM | POA: Diagnosis not present

## 2024-08-10 DIAGNOSIS — I4891 Unspecified atrial fibrillation: Secondary | ICD-10-CM | POA: Diagnosis not present

## 2024-08-10 DIAGNOSIS — I272 Pulmonary hypertension, unspecified: Secondary | ICD-10-CM | POA: Diagnosis not present

## 2024-08-10 DIAGNOSIS — Z1331 Encounter for screening for depression: Secondary | ICD-10-CM | POA: Diagnosis not present

## 2024-08-10 DIAGNOSIS — Z7189 Other specified counseling: Secondary | ICD-10-CM | POA: Diagnosis not present

## 2024-08-10 DIAGNOSIS — I5032 Chronic diastolic (congestive) heart failure: Secondary | ICD-10-CM | POA: Diagnosis not present

## 2024-08-11 ENCOUNTER — Telehealth (HOSPITAL_COMMUNITY): Payer: Self-pay

## 2024-08-11 NOTE — Telephone Encounter (Signed)
 Called to confirm/remind patient of their appointment at the [facility] on 08/12/24.   Appointment:   [x] Confirmed  [] Left mess   [] No answer/No voice mail  [] VM Full/unable to leave message  [] Phone not in service  Patient reminded to bring all medications and/or complete list.  Confirmed patient has transportation. Gave directions, instructed to utilize valet parking.

## 2024-08-12 ENCOUNTER — Encounter (HOSPITAL_COMMUNITY): Payer: Self-pay

## 2024-08-12 ENCOUNTER — Other Ambulatory Visit (HOSPITAL_COMMUNITY): Payer: Self-pay

## 2024-08-12 ENCOUNTER — Ambulatory Visit (HOSPITAL_COMMUNITY)
Admission: RE | Admit: 2024-08-12 | Discharge: 2024-08-12 | Disposition: A | Discharge status: Home / Self Care | Source: Ambulatory Visit | Attending: Family Medicine | Admitting: Family Medicine

## 2024-08-12 VITALS — BP 138/60 | HR 50 | Ht 66.0 in | Wt 172.6 lb

## 2024-08-12 DIAGNOSIS — E785 Hyperlipidemia, unspecified: Secondary | ICD-10-CM | POA: Diagnosis not present

## 2024-08-12 DIAGNOSIS — I050 Rheumatic mitral stenosis: Secondary | ICD-10-CM | POA: Diagnosis not present

## 2024-08-12 DIAGNOSIS — N1832 Chronic kidney disease, stage 3b: Secondary | ICD-10-CM | POA: Diagnosis not present

## 2024-08-12 DIAGNOSIS — I080 Rheumatic disorders of both mitral and aortic valves: Secondary | ICD-10-CM | POA: Diagnosis not present

## 2024-08-12 DIAGNOSIS — I4821 Permanent atrial fibrillation: Secondary | ICD-10-CM | POA: Insufficient documentation

## 2024-08-12 DIAGNOSIS — Z79899 Other long term (current) drug therapy: Secondary | ICD-10-CM | POA: Insufficient documentation

## 2024-08-12 DIAGNOSIS — I10 Essential (primary) hypertension: Secondary | ICD-10-CM

## 2024-08-12 DIAGNOSIS — I130 Hypertensive heart and chronic kidney disease with heart failure and stage 1 through stage 4 chronic kidney disease, or unspecified chronic kidney disease: Secondary | ICD-10-CM | POA: Insufficient documentation

## 2024-08-12 DIAGNOSIS — G4733 Obstructive sleep apnea (adult) (pediatric): Secondary | ICD-10-CM | POA: Insufficient documentation

## 2024-08-12 DIAGNOSIS — I6523 Occlusion and stenosis of bilateral carotid arteries: Secondary | ICD-10-CM | POA: Diagnosis not present

## 2024-08-12 DIAGNOSIS — Z7984 Long term (current) use of oral hypoglycemic drugs: Secondary | ICD-10-CM | POA: Insufficient documentation

## 2024-08-12 DIAGNOSIS — E1122 Type 2 diabetes mellitus with diabetic chronic kidney disease: Secondary | ICD-10-CM | POA: Insufficient documentation

## 2024-08-12 DIAGNOSIS — I272 Pulmonary hypertension, unspecified: Secondary | ICD-10-CM | POA: Diagnosis not present

## 2024-08-12 DIAGNOSIS — I350 Nonrheumatic aortic (valve) stenosis: Secondary | ICD-10-CM

## 2024-08-12 DIAGNOSIS — E1159 Type 2 diabetes mellitus with other circulatory complications: Secondary | ICD-10-CM | POA: Diagnosis not present

## 2024-08-12 DIAGNOSIS — I5032 Chronic diastolic (congestive) heart failure: Secondary | ICD-10-CM | POA: Insufficient documentation

## 2024-08-12 DIAGNOSIS — Z7901 Long term (current) use of anticoagulants: Secondary | ICD-10-CM | POA: Diagnosis not present

## 2024-08-12 NOTE — Progress Notes (Signed)
 [facility]  PCP: Maree Isles, MD Primary Cardiologist: Dr. Debera Nephrologist: Dr. Macel HF Cardiologist: Dr. Rolan  HPI: Robert Baldwin is a 83 y.o. male with history of carotid artery stenosis, DM II, HTN, HLD, permanent atrial fibrillation. He follows with Dr. Debera in the cardiology clinic.    Saw [facility] for carotid artery stenosis in 6/23. Was noting more dyspnea with exertion and lower extremity edema for about a month. He was subsequently started on po lasix  20 mg daily.   He was seen by Dr. Debera in the clinic 04/26/22 for evaluation of recent symptoms and pre-operative evaluation in preparation for left CEA followed by staged right CEA. Echo day of the visit showed EF 70-75%, hyperdynamic LV function, mild LVH, interventricular septum flattened in systole and diastole consistent with RV pressure and volume overload, RV moderately enlarged and moderately reduced, RVSP 120 mmHg, moderate LAE, severe RAE, moderate pericardial effusion, severe TR, dilated IVC. Patient was scheduled for outpatient R/LHC to better assess hemodynamics and further characterize pulmonary hypertension.    [facility] 7/23 showed no significant CAD, RA mean 25, PA 115/30 (58), LVEDP 22 mmHg, PCWP not obtained, Fick CO/CI 6.4/3.1, PAPi 3.4. PVR 5.6 WU. 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 He was diuresed with IV lasix . V/Q scan negative for chronic PE. ANA+. All other serologic test -. HRCT with no definite ILD. Started on tadalafil . Diuresed 24 lbs. Hospitalization complicated by right groin hematoma and received 1 unit PRBCs. Discharged home, weight 179 lbs.  Repeat RHC 8/23 showed elevated PCWP and severe mixed pulmonary arterial/venous hypertension, but PA pressures significantly lower than prior RHC. He was instructed to increase torsemide  to 40 mg daily alternating with 20 mg every other day. Spiro and Entresto  held with  hyperkalemia.  Patient had awake left TCAR by Dr. Gretta in 9/23, no complications.  S/p right TCAR 10/23. Tolerated procedure well.   Echo 7/24 showed EF 60-65%, D-shaped interventricular septum suggestive of RV pressure/volume overload, moderate RV enlargement with mildly decreased systolic function, PASP 78 mmHg, mild mitral stenosis mean gradient 4, mild aortic stenosis mean gradient 15.   Follow up 7/24, creatinine elevated 2.35>>3.32. 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 Torsemide  was increased to 40 mg bid.  Patient was admitted in 1/25 with multifocal PNA and volume overload at [facility].  He was diuresed and was treated with antibiotics.  He was sent home on oxygen  but is no longer using it. Echo in 1/25 showed EF 65-70%, moderate RV enlargement with moderate RV dysfunction, severe biatrial enlargement, mild mitral stenosis with mean gradient 4.7 mmHg, moderate TR, mild aortic stenosis with mean gradient 11 mmHg, PASP 76 mmHg.   Today he returns for HF follow up with his wife. Overall feeling fine. He graduated from [employer] and would like to go back. He is not SOB walking on flat ground with his rolling walker. 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 Denies palpitations, abnormal bleeding, CP, dizziness, edema, or PND/Orthopnea. Appetite ok. Weight at home stable. Taking all medications. INR followed by PCP.  ECG (personally reviewed): none ordered today  Labs (7/24): LDL 47, hgb 10.4, K 5.3, creatinine 3.32, BUN 102 Labs (9/24): K 4.0, creatinine 2.15 Labs (2/25): K 3.9,  creatinine 1.75, LDL 50 Labs (3/25): K 4.5, creatinine 2.29 Labs (6/25): K 4.5, creatinine 2.15 Labs (10/25): K 5.1, creatinine 1.99  6 minute walk  (7/24): 259 m 6 minute walk (9/24): 195 m 6 minutes walk (5/25): 243 m 6 minute walk today 08/12/24: 274 m  PMH: 1. HTN 2. Hyperlipidemia 3. Type 2 diabetes 4. CKD stage 3 5. Atrial fibrillation: Permanent.  6. Carotid stenosis: CTA neck (6/23) with high grade stenosis of the bilateral internal carotid arteries.  - S/p left TCAR 9/23.  - S/p right TCAR 10/23. - Carotid doppler (9/24): LICA stent 50-75% stenosis, RICA stent 50-75% stenosis.  7. 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  - R/LHC (7/23): no significant CAD; RA mean 25, PA 115/30 (58), LVEDP 22 mmHg, PCWP not obtained, Fick CO/CI 6.4/3.1, PAPi 3.4. PVR 5.6 WU. - RHC (8/23): Mean RA 8, PA 85/40 mean 41, mean PCWP 22, CI 2.86, PVR 3.4 WU - Echo (7/24): EF 60-65%, D-shaped interventricular septum suggestive of RV pressure/volume overload, moderate RV enlargement with mildly decreased systolic function, PASP 78 mmHg, mild mitral stenosis mean gradient 4, mild aortic stenosis mean gradient 15.  - RHC (7/24): RA mean 9, PA 78/16 (44), PCWP 17 (prominent v-waves to 31), CO/CI (Fick) 7.63/4.09, PVR 3.54 WU - Echo (1/25): EF 65-70%, moderate RV enlargement with moderate RV dysfunction, severe biatrial enlargement, mild mitral stenosis with mean gradient 4.7 mmHg, moderate TR, mild aortic stenosis with mean gradient 11 mmHg, PASP 76 mmHg.  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  See RHC and echo data above.   - V/Q scan (7/23): No evidence for chronic PE.  - High resolution CT chest (7/23): No definite ILD.  - + ANA -  RHC (7/24): RA mean 9, PA 78/16 (44), PCWP 17 (prominent v-waves to 31), CO/CI (Fick)  7.63/4.09, PVR 3.54 WU  Current Outpatient Medications  Medication Sig Dispense Refill   aspirin  EC 81 MG tablet Take 1 tablet (81 mg total) by mouth daily. Swallow whole.     atorvastatin  (LIPITOR) 40 MG tablet Take 40 mg by mouth at bedtime.     cholecalciferol  (VITAMIN D ) 25 MCG (1000 UNIT) tablet Take 1,000 Units by mouth daily.     FARXIGA  10 MG TABS tablet TAKE 1 TABLET BY MOUTH DAILY 30 tablet 4   fish oil-omega-3 fatty acids  1000 MG capsule Take 1 g by mouth every evening.     hydrALAZINE  (APRESOLINE ) 25 MG tablet TAKE 1 AND 1/2 TABLETS BY MOUTH THREE TIMES DAILY 90 tablet 6   macitentan  (OPSUMIT ) 10 MG tablet TAKE 1 TABLET BY MOUTH DAILY 90 tablet 3   Misc Natural Products (GLUCOS-CHONDROIT-MSM COMPLEX) TABS Take 2 tablets by mouth daily.     Multiple Vitamins-Minerals (MULTIVITAMIN WITH MINERALS) tablet Take 1 tablet by mouth in the morning.  Centrum Silver     niacin  500 MG tablet Take 500 mg by mouth in the morning.     potassium chloride  (KLOR-CON  M) 10 MEQ tablet TAKE 1 TABLET BY MOUTH DAILY 90 tablet 3   tadalafil  (CIALIS ) 20 MG tablet TAKE 2 TABLETS BY MOUTH DAILY 180 tablet 3   torsemide  (DEMADEX ) 20 MG tablet Take 3 tablets (60 mg total) by mouth  daily.     warfarin (COUMADIN ) 5 MG tablet Take 5-7.5 mg by mouth See admin instructions. Take 5 mg on Saturday,Sunday Tues, Wed, Thursday, and then  Take 7.5 mg on Monday,  Wednesday, and Friday     LOKELMA  10 g PACK packet TAKE 10 GRAM BY MOUTH AS DIRECTED (Patient not taking: Reported on 08/12/2024) 11 packet 2   No current facility-administered medications for this encounter.   Allergies  Allergen Reactions   Other Rash    Bandaids   Social History   Socioeconomic History   Marital status: Married    Spouse name: Tammy   Number of children: Not on file   Years of education: Not on file   Highest education level: Not on file  Occupational History   Occupation: Full time    Comment: Contractor  Tobacco Use    Smoking status: Never    Passive exposure: Never   Smokeless tobacco: Never   Tobacco comments:    Never smoke 05/18/22  Vaping Use   Vaping status: Never Used  Substance and Sexual Activity   Alcohol  use: No    Alcohol /week: 0.0 standard drinks of alcohol    Drug use: No   Sexual activity: Not Currently  Other Topics Concern   Not on file  Social History Narrative   Not on file   Social Drivers of Health   Financial Resource Strain: Not on file  Food Insecurity: No Food Insecurity (11/13/2023)   Hunger Vital Sign    Worried About Running Out of Food in the Last Year: Never true    Ran Out of Food in the Last Year: Never true  Transportation Needs: No Transportation Needs (11/13/2023)   PRAPARE - Administrator, Civil Service (Medical): No    Lack of Transportation (Non-Medical): No  Physical Activity: Not on file  Stress: Not on file  Social Connections: Socially Integrated (11/13/2023)   Social Connection and Isolation Panel    Frequency of Communication with Friends and Family: Three times a week    Frequency of Social Gatherings with Friends and Family: Once a week    Attends Religious Services: 1 to 4 times per year    Active Member of [employer] or Organizations: Yes    Attends Banker Meetings: 1 to 4 times per year    Marital Status: Married  Catering manager Violence: Not At Risk (11/13/2023)   Humiliation, Afraid, Rape, and Kick questionnaire    Fear of Current or Ex-Partner: No    Emotionally Abused: No    Physically Abused: No    Sexually Abused: No   Family History  Problem Relation Age of Onset   Stroke Other    Heart disease Other    BP 138/60   Pulse (!) 50   Ht 5' 6 (1.676 m)   Wt 78.3 kg (172 lb 9.6 oz)   SpO2 91%   BMI 27.86 kg/m   Wt Readings from Last 3 Encounters:  08/12/24 78.3 kg (172 lb 9.6 oz)  05/12/24 81.6 kg (179 lb 12.8 oz)  04/22/24 81.5 kg (179 lb 10.8 oz)   PHYSICAL EXAM: General:  NAD. No resp difficulty,  walked into clinic with rolling walker HEENT: Normal Neck: Supple. No JVD. Thick neck Cor: Irregular rate & rhythm. No rubs, gallops or murmurs. Lungs: Faint wheeze upper lobes Abdomen: Soft, obese, nontender, nondistended.  Extremities: No cyanosis, clubbing, rash, edema Neuro: Alert & oriented x 3, moves all 4 extremities w/o difficulty. Affect  pleasant.  ASSESSMENT & PLAN: 1. 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 RHC/LHC 7/23 showed preserved cardiac output with severe pulmonary hypertension. 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  PA pressure was still high, but is significantly lower than prior RHC.  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 Echo in 1/25 showed EF 65-70%, moderate RV enlargement with moderate RV dysfunction, severe biatrial enlargement, mild mitral stenosis with mean gradient 4.7 mmHg, moderate TR, mild aortic stenosis with mean gradient 11 mmHg, PASP 76 mmHg. NYHA class II-early III, functional class confounded by deconditioning. He is not volume overloaded today, GDMT recently limited by elevated creatinine. Labs reviewed from PCP visit 08/10/24 and are stable, K 5.1, creatinine 1.99.  - Continue torsemide  60 mg daily + 10 KCL daily - Continue Farxiga  10 mg daily.  - Off olmesartan with worsening SCr. 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  Some improvement on 8/23 RHC still with  severe mixed PAH/PVH with PVR down to 3.4 WU.  He has never smoked and has no known lung disease.  He has been on warfarin for atrial fibrillation long-term. No known rheumatologic illness.  Not a drinker though he has some signs of cirrhosis by prior liver imaging.  V/Q scan in 7/23 negative for chronic PEs and HRCT chest without definite ILD. Serologic workup showed anti-SCL 70 neg, anti-centromere Ab neg, RF neg, ANA with reflex positive. Suspicion for component of group 1 PH (mixed group 1 and 2).  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  6 minute walk today improved from previous.  - Diuretics as above. - Continue Opsumit  10 mg daily. - Continue tadalafil  40 mg daily. - Has OSA but unable to tolerate CPAP.   - Re-refer to Pulmonary Rehab at [facility], per his request. He understands he will likely pay out of pocket. 3. Atrial fibrillation: Permanent.   - Continue warfarin.   - Bradycardic, does not need nodal blocker. Asymptomatic 4. DM2: Continue SGLT2i 5. Carotid stenosis: CTA neck in 6/23 showed high grade bilateral ICA stenoses. He has had awake L TCAR 9/23 and R TCAR 10/23.  - He is on warfarin long-term for AF.    - Continue statin, goal LDL < 55.  Good lipids in 2/25.  6. HTN: BP stable today. - Continue hydralazine  37.5 mg bid.  - He is also on olmesartan 10 mg daily.  7. CKD stage IIIb: Last SCr 1.99 - Continue SGLT2i.  8. Aortic stenosis: Mild on 1/25 echo.  9. Mitral stenosis: Mild on 1/25 echo.   Follow up in 6 months with Dr. Rolan Raisin [facility] FNP-BC 08/12/2024

## 2024-08-12 NOTE — Progress Notes (Signed)
 6 Min Walk Test Completed  Pt ambulated 970ft (274.23m) O2 Sat ranged 89-85  HR ranged 70-61

## 2024-08-12 NOTE — Patient Instructions (Addendum)
 Good to see you today!  You have been referred to Pulmonary rehab they will call you to schedule an appointment  Your physician recommends that you schedule a follow-up appointment 6 months(April ) Call office in February to schedule an appointment  If you have any questions or concerns before your next appointment please send us  a message through [facility] or call our office at [phone].    TO LEAVE A MESSAGE FOR THE NURSE SELECT OPTION 2, PLEASE LEAVE A MESSAGE INCLUDING: YOUR NAME DATE OF BIRTH CALL BACK NUMBER REASON FOR CALL**this is important as we prioritize the call backs  YOU WILL RECEIVE A CALL BACK THE SAME DAY AS LONG AS YOU CALL BEFORE 4:00 PM At the [facility], you and your health needs are our priority. As part of our continuing mission to provide you with exceptional heart care, we have created designated Provider Care Teams. These Care Teams include your primary Cardiologist (physician) and Advanced Practice Providers (APPs- Physician Assistants and Nurse Practitioners) who all work together to provide you with the care you need, when you need it.   You may see any of the following providers on your designated Care Team at your next follow up: Dr Toribio Fuel Dr Ezra Shuck Dr. Ria Commander Dr. Morene Brownie Amy Lenetta, NP Caffie Shed, [place] [facility] Ii Maynard, [place] Beckey Coe, NP [place] Lee, NP Ellouise Class, NP Tinnie Redman, PharmD Jaun Bash, PharmD   Please be sure to bring in all your medications bottles to every appointment.    Thank you for choosing [facility]

## 2024-08-19 ENCOUNTER — Encounter (HOSPITAL_COMMUNITY)
Admission: RE | Admit: 2024-08-19 | Discharge: 2024-08-19 | Disposition: A | Discharge status: Home / Self Care | Source: Ambulatory Visit | Attending: Cardiology | Admitting: Cardiology

## 2024-08-19 DIAGNOSIS — I272 Pulmonary hypertension, unspecified: Secondary | ICD-10-CM | POA: Insufficient documentation

## 2024-08-20 DIAGNOSIS — N184 Chronic kidney disease, stage 4 (severe): Secondary | ICD-10-CM | POA: Diagnosis not present

## 2024-08-20 DIAGNOSIS — E1122 Type 2 diabetes mellitus with diabetic chronic kidney disease: Secondary | ICD-10-CM | POA: Diagnosis not present

## 2024-08-20 DIAGNOSIS — K7460 Unspecified cirrhosis of liver: Secondary | ICD-10-CM | POA: Diagnosis not present

## 2024-08-20 DIAGNOSIS — I5032 Chronic diastolic (congestive) heart failure: Secondary | ICD-10-CM | POA: Diagnosis not present

## 2024-08-20 DIAGNOSIS — I10 Essential (primary) hypertension: Secondary | ICD-10-CM | POA: Diagnosis not present

## 2024-08-20 DIAGNOSIS — E1169 Type 2 diabetes mellitus with other specified complication: Secondary | ICD-10-CM | POA: Diagnosis not present

## 2024-08-20 DIAGNOSIS — I4891 Unspecified atrial fibrillation: Secondary | ICD-10-CM | POA: Diagnosis not present

## 2024-08-20 DIAGNOSIS — Z299 Encounter for prophylactic measures, unspecified: Secondary | ICD-10-CM | POA: Diagnosis not present

## 2024-08-24 ENCOUNTER — Encounter (HOSPITAL_COMMUNITY)
Admission: RE | Admit: 2024-08-24 | Discharge: 2024-08-24 | Disposition: A | Discharge status: Home / Self Care | Source: Ambulatory Visit | Attending: Cardiology | Admitting: Cardiology

## 2024-08-24 VITALS — Ht 66.0 in | Wt 170.7 lb

## 2024-08-24 DIAGNOSIS — I272 Pulmonary hypertension, unspecified: Secondary | ICD-10-CM | POA: Insufficient documentation

## 2024-08-24 NOTE — Patient Instructions (Addendum)
 Patient Instructions  Patient Details  Name: Robert Baldwin MRN: [id number] Date of Birth: 15-Jul-1941 Referring Provider:  Rolan Ezra RAMAN, MD  Below are your personal goals for exercise, nutrition, and risk factors. Our goal is to help you stay on track towards obtaining and maintaining these goals. We will be discussing your progress on these goals with you throughout the program.  Initial Exercise Prescription:  Initial Exercise Prescription - 08/24/24 0800       Date of Initial Exercise RX and Referring Provider   Date 08/24/24    Referring Provider Rolan Ezra MD      Oxygen    Maintain Oxygen  Saturation 88% or higher      NuStep   Level 1    SPM 80    Minutes 15    METs 1.5      Track   Laps 9    Minutes 15    METs 1.5      Prescription Details   Frequency (times per week) 2    Duration Progress to 30 minutes of continuous aerobic without signs/symptoms of physical distress      Intensity   THRR 40-80% of Max Heartrate 79-118    Ratings of Perceived Exertion 11-13    Perceived Dyspnea 0-4      Progression   Progression Continue to progress workloads to maintain intensity without signs/symptoms of physical distress.      Resistance Training   Training Prescription Yes    Weight 3 lb    Reps 10-15          Exercise Goals: Frequency: Be able to perform aerobic exercise two to three times per week in program working toward 2-5 days per week of home exercise.  Intensity: Work with a perceived exertion of 11 (fairly light) - 15 (hard) while following your exercise prescription.  We will make changes to your prescription with you as you progress through the program.   Duration: Be able to do 30 to 45 minutes of continuous aerobic exercise in addition to a 5 minute warm-up and a 5 minute cool-down routine.   Nutrition Goals: Your personal nutrition goals will be established when you do your nutrition analysis with the dietician.  The following are general  nutrition guidelines to follow: Cholesterol < 200mg /day Sodium < 1500mg /day Fiber: Men over 50 yrs - 30 grams per day  Personal Goals:  Personal Goals and Risk Factors at Admission - 08/24/24 0856       Core Components/Risk Factors/Patient Goals on Admission    Weight Management Obesity;Weight Maintenance;Yes    Intervention Weight Management: Develop a combined nutrition and exercise program designed to reach desired caloric intake, while maintaining appropriate intake of nutrient and fiber, sodium and fats, and appropriate energy expenditure required for the weight goal.;Weight Management: Provide education and appropriate resources to help participant work on and attain dietary goals.;Weight Management/Obesity: Establish reasonable short term and long term weight goals.;Obesity: Provide education and appropriate resources to help participant work on and attain dietary goals.    Admit Weight 170 lb 11.2 oz (77.4 kg)    Goal Weight: Short Term 165 lb (74.8 kg)    Goal Weight: Long Term 160 lb (72.6 kg)    Expected Outcomes Short Term: Continue to assess and modify interventions until short term weight is achieved;Long Term: Adherence to nutrition and physical activity/exercise program aimed toward attainment of established weight goal;Weight Maintenance: Understanding of the daily nutrition guidelines, which includes 25-35% calories from fat, 7% or less  cal from saturated fats, less than 200mg  cholesterol, less than 1.5gm of sodium, & 5 or more servings of fruits and vegetables daily;Understanding of distribution of calorie intake throughout the day with the consumption of 4-5 meals/snacks;Understanding recommendations for meals to include 15-35% energy as protein, 25-35% energy from fat, 35-60% energy from carbohydrates, less than 200mg  of dietary cholesterol, 20-35 gm of total fiber daily    Improve shortness of breath with ADL's Yes    Intervention Provide education, individualized exercise plan  and daily activity instruction to help decrease symptoms of SOB with activities of daily living.    Expected Outcomes Short Term: Improve cardiorespiratory fitness to achieve a reduction of symptoms when performing ADLs;Long Term: Be able to perform more ADLs without symptoms or delay the onset of symptoms    Increase knowledge of respiratory medications and ability to use respiratory devices properly  Yes    Intervention Provide education and demonstration as needed of appropriate use of medications, inhalers, and oxygen  therapy.    Expected Outcomes Short Term: Achieves understanding of medications use. Understands that oxygen  is a medication prescribed by physician. Demonstrates appropriate use of inhaler and oxygen  therapy.;Long Term: Maintain appropriate use of medications, inhalers, and oxygen  therapy.    Diabetes Yes    Intervention Provide education about proper nutrition, including hydration, and aerobic/resistive exercise prescription along with prescribed medications to achieve blood glucose in normal ranges: Fasting glucose 65-99 mg/dL;Provide education about signs/symptoms and action to take for hypo/hyperglycemia.    Expected Outcomes Short Term: Participant verbalizes understanding of the signs/symptoms and immediate care of hyper/hypoglycemia, proper foot care and importance of medication, aerobic/resistive exercise and nutrition plan for blood glucose control.;Long Term: Attainment of HbA1C < 7%.    Heart Failure Yes    Intervention Provide a combined exercise and nutrition program that is supplemented with education, support and counseling about heart failure. Directed toward relieving symptoms such as shortness of breath, decreased exercise tolerance, and extremity edema.    Expected Outcomes Improve functional capacity of life;Short term: Attendance in program 2-3 days a week with increased exercise capacity. Reported lower sodium intake. Reported increased fruit and vegetable intake.  Reports medication compliance.;Short term: Daily weights obtained and reported for increase. Utilizing diuretic protocols set by physician.;Long term: Adoption of self-care skills and reduction of barriers for early signs and symptoms recognition and intervention leading to self-care maintenance.    Hypertension Yes    Intervention Monitor prescription use compliance.;Provide education on lifestyle modifcations including regular physical activity/exercise, weight management, moderate sodium restriction and increased consumption of fresh fruit, vegetables, and low fat dairy, alcohol  moderation, and smoking cessation.    Expected Outcomes Short Term: Continued assessment and intervention until BP is < 140/80mm HG in hypertensive participants. < 130/60mm HG in hypertensive participants with diabetes, heart failure or chronic kidney disease.;Long Term: Maintenance of blood pressure at goal levels.    Lipids Yes    Intervention Provide education and support for participant on nutrition & aerobic/resistive exercise along with prescribed medications to achieve LDL 70mg , HDL >40mg .    Expected Outcomes Short Term: Participant states understanding of desired cholesterol values and is compliant with medications prescribed. Participant is following exercise prescription and nutrition guidelines.;Long Term: Cholesterol controlled with medications as prescribed, with individualized exercise RX and with personalized nutrition plan. Value goals: LDL < 70mg , HDL > 40 mg.          Tobacco Use Initial Evaluation: Social History   Tobacco Use  Smoking Status Never  Passive exposure: Never  Smokeless Tobacco Never  Tobacco Comments   Never smoke 05/18/22    Exercise Goals and Review:  Exercise Goals     Row Name 08/24/24 0855             Exercise Goals   Increase Physical Activity Yes       Intervention Provide advice, education, support and counseling about physical activity/exercise needs.;Develop  an individualized exercise prescription for aerobic and resistive training based on initial evaluation findings, risk stratification, comorbidities and participant's personal goals.       Expected Outcomes Short Term: Attend rehab on a regular basis to increase amount of physical activity.;Long Term: Add in home exercise to make exercise part of routine and to increase amount of physical activity.;Long Term: Exercising regularly at least 3-5 days a week.       Increase Strength and Stamina Yes       Intervention Provide advice, education, support and counseling about physical activity/exercise needs.;Develop an individualized exercise prescription for aerobic and resistive training based on initial evaluation findings, risk stratification, comorbidities and participant's personal goals.       Expected Outcomes Short Term: Increase workloads from initial exercise prescription for resistance, speed, and METs.;Short Term: Perform resistance training exercises routinely during rehab and add in resistance training at home;Long Term: Improve cardiorespiratory fitness, muscular endurance and strength as measured by increased METs and functional capacity ( )       Able to understand and use rate of perceived exertion (RPE) scale Yes       Intervention Provide education and explanation on how to use RPE scale       Expected Outcomes Short Term: Able to use RPE daily in rehab to express subjective intensity level;Long Term:  Able to use RPE to guide intensity level when exercising independently       Able to understand and use Dyspnea scale Yes       Intervention Provide education and explanation on how to use Dyspnea scale       Expected Outcomes Short Term: Able to use Dyspnea scale daily in rehab to express subjective sense of shortness of breath during exertion;Long Term: Able to use Dyspnea scale to guide intensity level when exercising independently       Knowledge and understanding of Target Heart Rate  Range (THRR) Yes       Intervention Provide education and explanation of THRR including how the numbers were predicted and where they are located for reference       Expected Outcomes Short Term: Able to use daily as guideline for intensity in rehab;Long Term: Able to use THRR to govern intensity when exercising independently;Short Term: Able to state/look up THRR       Able to check pulse independently Yes       Intervention Provide education and demonstration on how to check pulse in carotid and radial arteries.;Review the importance of being able to check your own pulse for safety during independent exercise       Expected Outcomes Short Term: Able to explain why pulse checking is important during independent exercise;Long Term: Able to check pulse independently and accurately       Understanding of Exercise Prescription Yes       Intervention Provide education, explanation, and written materials on patient's individual exercise prescription       Expected Outcomes Short Term: Able to explain program exercise prescription;Long Term: Able to explain home exercise prescription to exercise independently  Copy of goals given to participant.

## 2024-08-24 NOTE — Progress Notes (Signed)
 Pulmonary Individual Treatment Plan  Patient Details  Name: Robert Baldwin MRN: [id number] Date of Birth: 1941-07-19 Referring Provider:   Flowsheet Row PULMONARY REHAB OTHER RESP ORIENTATION from 08/24/2024 in [facility] CARDIAC REHABILITATION  Referring Provider Rolan Barrack MD    Initial Encounter Date:  Flowsheet Row PULMONARY REHAB OTHER RESP ORIENTATION from 08/24/2024 in [facility] CARDIAC REHABILITATION  Date 08/24/24    Visit Diagnosis: Severe pulmonary hypertension (HCC)  Patient's Home Medications on Admission:   Current Outpatient Medications:    aspirin  EC 81 MG tablet, Take 1 tablet (81 mg total) by mouth daily. Swallow whole., Disp: , Rfl:    atorvastatin  (LIPITOR) 40 MG tablet, Take 40 mg by mouth at bedtime., Disp: , Rfl:    cholecalciferol  (VITAMIN D ) 25 MCG (1000 UNIT) tablet, Take 1,000 Units by mouth daily., Disp: , Rfl:    FARXIGA  10 MG TABS tablet, TAKE 1 TABLET BY MOUTH DAILY, Disp: 30 tablet, Rfl: 4   fish oil-omega-3 fatty acids  1000 MG capsule, Take 1 g by mouth every evening., Disp: , Rfl:    hydrALAZINE  (APRESOLINE ) 25 MG tablet, TAKE 1 AND 1/2 TABLETS BY MOUTH THREE TIMES DAILY, Disp: 90 tablet, Rfl: 6   LOKELMA  10 g PACK packet, TAKE 10 GRAM BY MOUTH AS DIRECTED (Patient not taking: Reported on 08/19/2024), Disp: 11 packet, Rfl: 2   macitentan  (OPSUMIT ) 10 MG tablet, TAKE 1 TABLET BY MOUTH DAILY, Disp: 90 tablet, Rfl: 3   Misc Natural Products (GLUCOS-CHONDROIT-MSM COMPLEX) TABS, Take 2 tablets by mouth daily., Disp: , Rfl:    Multiple Vitamins-Minerals (MULTIVITAMIN WITH MINERALS) tablet, Take 1 tablet by mouth in the morning.  Centrum Silver, Disp: , Rfl:    niacin  500 MG tablet, Take 500 mg by mouth in the morning., Disp: , Rfl:    potassium chloride  (KLOR-CON  M) 10 MEQ tablet, TAKE 1 TABLET BY MOUTH DAILY, Disp: 90 tablet, Rfl: 3   tadalafil  (CIALIS ) 20 MG tablet, TAKE 2 TABLETS BY MOUTH DAILY, Disp: 180 tablet, Rfl: 3   torsemide  (DEMADEX ) 20 MG  tablet, Take 3 tablets (60 mg total) by mouth daily., Disp: , Rfl:    warfarin (COUMADIN ) 5 MG tablet, Take 5-7.5 mg by mouth See admin instructions. Take 5 mg on Saturday,Sunday Tues, Wed, Thursday, and then  Take 7.5 mg on Monday,  Wednesday, and Friday, Disp: , Rfl:   Past Medical History: Past Medical History:  Diagnosis Date   (HFpEF) heart failure with preserved ejection fraction (HCC)    Arthritis    Atrial fibrillation (HCC)    Bradycardia    Carotid artery disease    Hyperlipidemia    Hypertension    Pulmonary hypertension (HCC)    Type 2 diabetes mellitus (HCC)     Tobacco Use: Social History   Tobacco Use  Smoking Status Never   Passive exposure: Never  Smokeless Tobacco Never  Tobacco Comments   Never smoke 05/18/22    Labs: Review Flowsheet  More data exists      Latest Ref Rng & Units 05/14/2023 05/21/2023 11/13/2023 11/14/2023 12/18/2023  Labs for ITP Cardiac and Pulmonary Rehab  Cholestrol 0 - 200 mg/dL 98  - - - 881   LDL (calc) 0 - 99 mg/dL 47  - - - 50   HDL-C >59 mg/dL 42  - - - 59   Trlycerides <150 mg/dL 44  - - - 45   Hemoglobin A1c 4.8 - 5.6 % - - - 7.4  -  PH, Arterial  7.35 - 7.45 - 7.402  7.415  - - -  PCO2 arterial 32 - 48 mmHg - 41.4  40.9  - - -  Bicarbonate 20.0 - 28.0 mmol/L - 25.7  26.2  31.9  - -  TCO2 22 - 32 mmol/L - 27  27  29   - -  O2 Saturation % - 73  75  86.4  - -    Details       Multiple values from one day are sorted in reverse-chronological order         Capillary Blood Glucose: Lab Results  Component Value Date   GLUCAP 124 (H) 02/05/2024   GLUCAP 156 (H) 02/05/2024   GLUCAP 155 (H) 02/03/2024   GLUCAP 130 (H) 01/29/2024   GLUCAP 138 (H) 01/29/2024     Pulmonary Assessment Scores:  Pulmonary Assessment Scores     Row Name 08/24/24 0843         ADL UCSD   ADL Phase Entry     SOB Score total 17     Rest 2     Walk 2     Stairs 2     Bath 0     Dress 0     Shop 0       CAT Score   CAT Score 11        mMRC Score   mMRC Score 2       UCSD: Self-administered rating of dyspnea associated with activities of daily living (ADLs) 6-point scale (0 = not at all to 5 = maximal or unable to do because of breathlessness)  Scoring Scores range from 0 to 120.  Minimally important difference is 5 units  CAT: CAT can identify the health impairment of COPD patients and is better correlated with disease progression.  CAT has a scoring range of zero to 40. The CAT score is classified into four groups of low (less than 10), medium (10 - 20), high (21-30) and very high (31-40) based on the impact level of disease on health status. A CAT score over 10 suggests significant symptoms.  A worsening CAT score could be explained by an exacerbation, poor medication adherence, poor inhaler technique, or progression of COPD or comorbid conditions.  CAT MCID is 2 points  mMRC: mMRC (Modified Medical Research Council) Dyspnea Scale is used to assess the degree of baseline functional disability in patients of respiratory disease due to dyspnea. No minimal important difference is established. A decrease in score of 1 point or greater is considered a positive change.   Pulmonary Function Assessment:   Exercise Target Goals: Exercise Program Goal: Individual exercise prescription set using results from initial 6 min walk test and THRR while considering  patient's activity barriers and safety.   Exercise Prescription Goal: Initial exercise prescription builds to 30-45 minutes a day of aerobic activity, 2-3 days per week.  Home exercise guidelines will be given to patient during program as part of exercise prescription that the participant will acknowledge.  Activity Barriers & Risk Stratification:  Activity Barriers & Cardiac Risk Stratification - 08/24/24 0853       Activity Barriers & Cardiac Risk Stratification   Activity Barriers Deconditioning;Assistive Device;Shortness of Breath;History of Falls;Balance  Concerns;Muscular Weakness          6 Minute Walk:  6 Minute Walk     Row Name 08/24/24 0852         6 Minute Walk   Phase Initial     Distance  710 feet     Walk Time 5.75 minutes     # of Rest Breaks 1  15 sec to tie shoe     MPH 1.4     METS 1.03     RPE 15     Perceived Dyspnea  2     VO2 Peak 3.6     Symptoms Yes (comment)     Comments SOB, fatigue     Resting HR 40 bpm     Resting BP 160/60     Resting Oxygen  Saturation  94 %     Exercise Oxygen  Saturation  during 6 min walk 86 %     Max Ex. HR 68 bpm     Max Ex. BP 170/64     2 Minute Post BP 124/66       Interval HR   1 Minute HR 54     2 Minute HR 54     3 Minute HR 56     4 Minute HR 62     5 Minute HR 58     6 Minute HR 57     2 Minute Post HR 44     Interval Heart Rate? Yes       Interval Oxygen    Interval Oxygen ? Yes     Baseline Oxygen  Saturation % 94 %     1 Minute Oxygen  Saturation % 87 %     1 Minute Liters of Oxygen  0 L  Room Air     2 Minute Oxygen  Saturation % 87 %     2 Minute Liters of Oxygen  0 L     3 Minute Oxygen  Saturation % 86 %     3 Minute Liters of Oxygen  0 L     4 Minute Oxygen  Saturation % 87 %     4 Minute Liters of Oxygen  0 L     5 Minute Oxygen  Saturation % 87 %     5 Minute Liters of Oxygen  0 L     6 Minute Oxygen  Saturation % 87 %     6 Minute Liters of Oxygen  0 L     2 Minute Post Oxygen  Saturation % 90 %     2 Minute Post Liters of Oxygen  0 L        Oxygen  Initial Assessment:  Oxygen  Initial Assessment - 08/24/24 0857       Home Oxygen    Home Oxygen  Device None    Sleep Oxygen  Prescription None    Home Exercise Oxygen  Prescription None    Home Resting Oxygen  Prescription None      Initial 6 min Walk   Oxygen  Used None      Program Oxygen  Prescription   Program Oxygen  Prescription None      Intervention   Short Term Goals To learn and exhibit compliance with exercise, home and travel O2 prescription;To learn and understand importance of monitoring  SPO2 with pulse oximeter and demonstrate accurate use of the pulse oximeter.;To learn and understand importance of maintaining oxygen  saturations>88%;To learn and demonstrate proper pursed lip breathing techniques or other breathing techniques.     Long  Term Goals Exhibits compliance with exercise, home  and travel O2 prescription;Maintenance of O2 saturations>88%;Exhibits proper breathing techniques, such as pursed lip breathing or other method taught during program session;Verbalizes importance of monitoring SPO2 with pulse oximeter and return demonstration          Oxygen  Re-Evaluation:   Oxygen  Discharge (Final Oxygen  Re-Evaluation):  Initial Exercise Prescription:  Initial Exercise Prescription - 08/24/24 0800       Date of Initial Exercise RX and Referring Provider   Date 08/24/24    Referring Provider Rolan Barrack MD      Oxygen    Maintain Oxygen  Saturation 88% or higher      NuStep   Level 1    SPM 80    Minutes 15    METs 1.5      Track   Laps 9    Minutes 15    METs 1.5      Prescription Details   Frequency (times per week) 2    Duration Progress to 30 minutes of continuous aerobic without signs/symptoms of physical distress      Intensity   THRR 40-80% of Max Heartrate 79-118    Ratings of Perceived Exertion 11-13    Perceived Dyspnea 0-4      Progression   Progression Continue to progress workloads to maintain intensity without signs/symptoms of physical distress.      Resistance Training   Training Prescription Yes    Weight 3 lb    Reps 10-15          Perform Capillary Blood Glucose checks as needed.  Exercise Prescription Changes:   Exercise Prescription Changes     Row Name 08/24/24 0800             Response to Exercise   Blood Pressure (Admit) 160/60       Blood Pressure (Exercise) 170/64       Blood Pressure (Exit) 124/62       Heart Rate (Admit) 40 bpm       Heart Rate (Exercise) 68 bpm       Heart Rate (Exit) 44 bpm        Oxygen  Saturation (Admit) 94 %       Oxygen  Saturation (Exercise) 86 %       Oxygen  Saturation (Exit) 91 %       Rating of Perceived Exertion (Exercise) 15       Perceived Dyspnea (Exercise) 2       Symptoms SOb, fatigue       Comments walk test results          Exercise Comments:   Exercise Goals and Review:   Exercise Goals     Row Name 08/24/24 0855             Exercise Goals   Increase Physical Activity Yes       Intervention Provide advice, education, support and counseling about physical activity/exercise needs.;Develop an individualized exercise prescription for aerobic and resistive training based on initial evaluation findings, risk stratification, comorbidities and participant's personal goals.       Expected Outcomes Short Term: Attend rehab on a regular basis to increase amount of physical activity.;Long Term: Add in home exercise to make exercise part of routine and to increase amount of physical activity.;Long Term: Exercising regularly at least 3-5 days a week.       Increase Strength and Stamina Yes       Intervention Provide advice, education, support and counseling about physical activity/exercise needs.;Develop an individualized exercise prescription for aerobic and resistive training based on initial evaluation findings, risk stratification, comorbidities and participant's personal goals.       Expected Outcomes Short Term: Increase workloads from initial exercise prescription for resistance, speed, and METs.;Short Term: Perform resistance training exercises routinely during rehab and add in resistance training at home;Long  Term: Improve cardiorespiratory fitness, muscular endurance and strength as measured by increased METs and functional capacity ( )       Able to understand and use rate of perceived exertion (RPE) scale Yes       Intervention Provide education and explanation on how to use RPE scale       Expected Outcomes Short Term: Able to use RPE daily  in rehab to express subjective intensity level;Long Term:  Able to use RPE to guide intensity level when exercising independently       Able to understand and use Dyspnea scale Yes       Intervention Provide education and explanation on how to use Dyspnea scale       Expected Outcomes Short Term: Able to use Dyspnea scale daily in rehab to express subjective sense of shortness of breath during exertion;Long Term: Able to use Dyspnea scale to guide intensity level when exercising independently       Knowledge and understanding of Target Heart Rate Range (THRR) Yes       Intervention Provide education and explanation of THRR including how the numbers were predicted and where they are located for reference       Expected Outcomes Short Term: Able to use daily as guideline for intensity in rehab;Long Term: Able to use THRR to govern intensity when exercising independently;Short Term: Able to state/look up THRR       Able to check pulse independently Yes       Intervention Provide education and demonstration on how to check pulse in carotid and radial arteries.;Review the importance of being able to check your own pulse for safety during independent exercise       Expected Outcomes Short Term: Able to explain why pulse checking is important during independent exercise;Long Term: Able to check pulse independently and accurately       Understanding of Exercise Prescription Yes       Intervention Provide education, explanation, and written materials on patient's individual exercise prescription       Expected Outcomes Short Term: Able to explain program exercise prescription;Long Term: Able to explain home exercise prescription to exercise independently          Exercise Goals Re-Evaluation :   Discharge Exercise Prescription (Final Exercise Prescription Changes):  Exercise Prescription Changes - 08/24/24 0800       Response to Exercise   Blood Pressure (Admit) 160/60    Blood Pressure (Exercise)  170/64    Blood Pressure (Exit) 124/62    Heart Rate (Admit) 40 bpm    Heart Rate (Exercise) 68 bpm    Heart Rate (Exit) 44 bpm    Oxygen  Saturation (Admit) 94 %    Oxygen  Saturation (Exercise) 86 %    Oxygen  Saturation (Exit) 91 %    Rating of Perceived Exertion (Exercise) 15    Perceived Dyspnea (Exercise) 2    Symptoms SOb, fatigue    Comments walk test results          Nutrition:  Target Goals: Understanding of nutrition guidelines, daily intake of sodium 1500mg , cholesterol 200mg , calories 30% from fat and 7% or less from saturated fats, daily to have 5 or more servings of fruits and vegetables.  Biometrics:  Pre Biometrics - 08/24/24 0855       Pre Biometrics   Height 5' 6 (1.676 m)    Weight 77.4 kg    Waist Circumference 39.25 inches    Hip Circumference 40 inches    Waist  to Hip Ratio 0.98 %    BMI (Calculated) 27.56    Grip Strength 17.4 kg    Single Leg Stand 2.9 seconds           Nutrition Therapy Plan and Nutrition Goals:  Nutrition Therapy & Goals - 08/24/24 0856       Intervention Plan   Intervention Prescribe, educate and counsel regarding individualized specific dietary modifications aiming towards targeted core components such as weight, hypertension, lipid management, diabetes, heart failure and other comorbidities.;Nutrition handout(s) given to patient.    Expected Outcomes Short Term Goal: Understand basic principles of dietary content, such as calories, fat, sodium, cholesterol and nutrients.;Long Term Goal: Adherence to prescribed nutrition plan.          Nutrition Assessments:  MEDIFICTS Score Key: >=70 Need to make dietary changes  40-70 Heart Healthy Diet <= 40 Therapeutic Level Cholesterol Diet  Flowsheet Row PULMONARY REHAB OTHER RESP ORIENTATION from 08/24/2024 in [facility] CARDIAC REHABILITATION  Picture Your Plate Total Score on Admission 65   Picture Your Plate Scores: <59 Unhealthy dietary pattern with much room for  improvement. 41-50 Dietary pattern unlikely to meet recommendations for good health and room for improvement. 51-60 More healthful dietary pattern, with some room for improvement.  >60 Healthy dietary pattern, although there may be some specific behaviors that could be improved.    Nutrition Goals Re-Evaluation:   Nutrition Goals Discharge (Final Nutrition Goals Re-Evaluation):   Psychosocial: Target Goals: Acknowledge presence or absence of significant depression and/or stress, maximize coping skills, provide positive support system. Participant is able to verbalize types and ability to use techniques and skills needed for reducing stress and depression.  Initial Review & Psychosocial Screening:  Initial Psych Review & Screening - 08/19/24 1022       Family Dynamics   Good Support System? Yes    Comments Patient's wife is his main support.      Barriers   Psychosocial barriers to participate in program There are no identifiable barriers or psychosocial needs.;The patient should benefit from training in stress management and relaxation.      Screening Interventions   Interventions Encouraged to exercise;To provide support and resources with identified psychosocial needs;Provide feedback about the scores to participant    Expected Outcomes Short Term goal: Utilizing psychosocial counselor, staff and physician to assist with identification of specific Stressors or current issues interfering with healing process. Setting desired goal for each stressor or current issue identified.;Long Term Goal: Stressors or current issues are controlled or eliminated.;Short Term goal: Identification and review with participant of any Quality of Life or Depression concerns found by scoring the questionnaire.;Long Term goal: The participant improves quality of Life and PHQ9 Scores as seen by post scores and/or verbalization of changes          Quality of Life Scores:  Scores of 19 and below usually  indicate a poorer quality of life in these areas.  A difference of  2-3 points is a clinically meaningful difference.  A difference of 2-3 points in the total score of the Quality of Life Index has been associated with significant improvement in overall quality of life, self-image, physical symptoms, and general health in studies assessing change in quality of life.  PHQ-9: Review Flowsheet       08/24/2024 04/29/2024 01/23/2024  Depression screen PHQ 2/9  Decreased Interest 0 0 0  Down, Depressed, Hopeless 0 0 0  PHQ - 2 Score 0 0 0  Altered sleeping 0 0  0  Tired, decreased energy 1 1 0  Change in appetite 0 0 0  Feeling bad or failure about yourself  0 0 0  Trouble concentrating 0 0 0  Moving slowly or fidgety/restless 0 0 0  Suicidal thoughts 0 0 0  PHQ-9 Score 1 1 0  Difficult doing work/chores Not difficult at all Not difficult at all -   Interpretation of Total Score  Total Score Depression Severity:  1-4 = Minimal depression, 5-9 = Mild depression, 10-14 = Moderate depression, 15-19 = Moderately severe depression, 20-27 = Severe depression   Psychosocial Evaluation and Intervention:   Psychosocial Re-Evaluation:   Psychosocial Discharge (Final Psychosocial Re-Evaluation):   Education: Education Goals: Education classes will be provided on a weekly basis, covering required topics. Participant will state understanding/return demonstration of topics presented.  Learning Barriers/Preferences:  Learning Barriers/Preferences - 08/19/24 1027       Learning Barriers/Preferences   Learning Barriers Sight    Learning Preferences Pictoral;Written Material          Education Topics: Know Your Numbers Group instruction that is supported by a PowerPoint presentation. Instructor discusses importance of knowing and understanding resting, exercise, and post-exercise oxygen  saturation, heart rate, and blood pressure. Oxygen  saturation, heart rate, blood pressure, rating of  perceived exertion, and dyspnea are reviewed along with a normal range for these values.    Exercise for the Pulmonary Patient Group instruction that is supported by a PowerPoint presentation. Instructor discusses benefits of exercise, core components of exercise, frequency, duration, and intensity of an exercise routine, importance of utilizing pulse oximetry during exercise, safety while exercising, and options of places to exercise outside of rehab.    MET Level  Group instruction provided by PowerPoint, verbal discussion, and written material to support subject matter. Instructor reviews what METs are and how to increase METs.    Pulmonary Medications Verbally interactive group education provided by instructor with focus on inhaled medications and proper administration.   Anatomy and Physiology of the Respiratory System Group instruction provided by PowerPoint, verbal discussion, and written material to support subject matter. Instructor reviews respiratory cycle and anatomical components of the respiratory system and their functions. Instructor also reviews differences in obstructive and restrictive respiratory diseases with examples of each.    Oxygen  Safety Group instruction provided by PowerPoint, verbal discussion, and written material to support subject matter. There is an overview of "What is Oxygen " and "Why do we need it".  Instructor also reviews how to create a safe environment for oxygen  use, the importance of using oxygen  as prescribed, and the risks of noncompliance. There is a brief discussion on traveling with oxygen  and resources the patient may utilize.   Oxygen  Use Group instruction provided by PowerPoint, verbal discussion, and written material to discuss how supplemental oxygen  is prescribed and different types of oxygen  supply systems. Resources for more information are provided.    Breathing Techniques Group instruction that is supported by demonstration and  informational handouts. Instructor discusses the benefits of pursed lip and diaphragmatic breathing and detailed demonstration on how to perform both.     Risk Factor Reduction Group instruction that is supported by a PowerPoint presentation. Instructor discusses the definition of a risk factor, different risk factors for pulmonary disease, and how the heart and lungs work together.   Pulmonary Diseases Group instruction provided by PowerPoint, verbal discussion, and written material to support subject matter. Instructor gives an overview of the different type of pulmonary diseases. There is also a discussion  on risk factors and symptoms as well as ways to manage the diseases.   Stress and Energy Conservation Group instruction provided by PowerPoint, verbal discussion, and written material to support subject matter. Instructor gives an overview of stress and the impact it can have on the body. Instructor also reviews ways to reduce stress. There is also a discussion on energy conservation and ways to conserve energy throughout the day.   Warning Signs and Symptoms Group instruction provided by PowerPoint, verbal discussion, and written material to support subject matter. Instructor reviews warning signs and symptoms of stroke, heart attack, cold and flu. Instructor also reviews ways to prevent the spread of infection.   Other Education Group or individual verbal, written, or video instructions that support the educational goals of the pulmonary rehab program.    Knowledge Questionnaire Score:  Knowledge Questionnaire Score - 08/24/24 0838       Knowledge Questionnaire Score   Pre Score 12/18          Core Components/Risk Factors/Patient Goals at Admission:  Personal Goals and Risk Factors at Admission - 08/24/24 0856       Core Components/Risk Factors/Patient Goals on Admission    Weight Management Obesity;Weight Maintenance;Yes    Intervention Weight Management: Develop a  combined nutrition and exercise program designed to reach desired caloric intake, while maintaining appropriate intake of nutrient and fiber, sodium and fats, and appropriate energy expenditure required for the weight goal.;Weight Management: Provide education and appropriate resources to help participant work on and attain dietary goals.;Weight Management/Obesity: Establish reasonable short term and long term weight goals.;Obesity: Provide education and appropriate resources to help participant work on and attain dietary goals.    Admit Weight 170 lb 11.2 oz (77.4 kg)    Goal Weight: Short Term 165 lb (74.8 kg)    Goal Weight: Long Term 160 lb (72.6 kg)    Expected Outcomes Short Term: Continue to assess and modify interventions until short term weight is achieved;Long Term: Adherence to nutrition and physical activity/exercise program aimed toward attainment of established weight goal;Weight Maintenance: Understanding of the daily nutrition guidelines, which includes 25-35% calories from fat, 7% or less cal from saturated fats, less than 200mg  cholesterol, less than 1.5gm of sodium, & 5 or more servings of fruits and vegetables daily;Understanding of distribution of calorie intake throughout the day with the consumption of 4-5 meals/snacks;Understanding recommendations for meals to include 15-35% energy as protein, 25-35% energy from fat, 35-60% energy from carbohydrates, less than 200mg  of dietary cholesterol, 20-35 gm of total fiber daily    Improve shortness of breath with ADL's Yes    Intervention Provide education, individualized exercise plan and daily activity instruction to help decrease symptoms of SOB with activities of daily living.    Expected Outcomes Short Term: Improve cardiorespiratory fitness to achieve a reduction of symptoms when performing ADLs;Long Term: Be able to perform more ADLs without symptoms or delay the onset of symptoms    Increase knowledge of respiratory medications and  ability to use respiratory devices properly  Yes    Intervention Provide education and demonstration as needed of appropriate use of medications, inhalers, and oxygen  therapy.    Expected Outcomes Short Term: Achieves understanding of medications use. Understands that oxygen  is a medication prescribed by physician. Demonstrates appropriate use of inhaler and oxygen  therapy.;Long Term: Maintain appropriate use of medications, inhalers, and oxygen  therapy.    Diabetes Yes    Intervention Provide education about proper nutrition, including hydration, and aerobic/resistive exercise prescription  along with prescribed medications to achieve blood glucose in normal ranges: Fasting glucose 65-99 mg/dL;Provide education about signs/symptoms and action to take for hypo/hyperglycemia.    Expected Outcomes Short Term: Participant verbalizes understanding of the signs/symptoms and immediate care of hyper/hypoglycemia, proper foot care and importance of medication, aerobic/resistive exercise and nutrition plan for blood glucose control.;Long Term: Attainment of HbA1C < 7%.    Heart Failure Yes    Intervention Provide a combined exercise and nutrition program that is supplemented with education, support and counseling about heart failure. Directed toward relieving symptoms such as shortness of breath, decreased exercise tolerance, and extremity edema.    Expected Outcomes Improve functional capacity of life;Short term: Attendance in program 2-3 days a week with increased exercise capacity. Reported lower sodium intake. Reported increased fruit and vegetable intake. Reports medication compliance.;Short term: Daily weights obtained and reported for increase. Utilizing diuretic protocols set by physician.;Long term: Adoption of self-care skills and reduction of barriers for early signs and symptoms recognition and intervention leading to self-care maintenance.    Hypertension Yes    Intervention Monitor prescription use  compliance.;Provide education on lifestyle modifcations including regular physical activity/exercise, weight management, moderate sodium restriction and increased consumption of fresh fruit, vegetables, and low fat dairy, alcohol  moderation, and smoking cessation.    Expected Outcomes Short Term: Continued assessment and intervention until BP is < 140/27mm HG in hypertensive participants. < 130/64mm HG in hypertensive participants with diabetes, heart failure or chronic kidney disease.;Long Term: Maintenance of blood pressure at goal levels.    Lipids Yes    Intervention Provide education and support for participant on nutrition & aerobic/resistive exercise along with prescribed medications to achieve LDL 70mg , HDL >40mg .    Expected Outcomes Short Term: Participant states understanding of desired cholesterol values and is compliant with medications prescribed. Participant is following exercise prescription and nutrition guidelines.;Long Term: Cholesterol controlled with medications as prescribed, with individualized exercise RX and with personalized nutrition plan. Value goals: LDL < 70mg , HDL > 40 mg.          Core Components/Risk Factors/Patient Goals Review:    Core Components/Risk Factors/Patient Goals at Discharge (Final Review):    ITP Comments:  ITP Comments     Row Name 08/24/24 0849           ITP Comments Patient arrived for 1st visit/orientation/education at 0800. Patient was referred to PR by Dr. Ezra Hoffman due to Pulmonary HTN. During orientation advised patient on arrival and appointment times what to wear, what to do before, during and after exercise. Reviewed attendance and class policy.  Pt is scheduled to return Pulmonary Rehab on 08/31/24 at 915. Pt was advised to come to class 15 minutes before class starts.  Discussed RPE/Dpysnea scales. Patient participated in warm up stretches. Patient was able to complete 6 minute walk test. Patient was measured for the equipment.  Discussed equipment safety with patient. Took patient pre-anthropometric measurements. Patient finished visit at 845.          Comments: Patient arrived for 1st visit/orientation/education at 0800. Patient was referred to PR by Dr. Ezra Hoffman due to Pulmonary HTN. During orientation advised patient on arrival and appointment times what to wear, what to do before, during and after exercise. Reviewed attendance and class policy.  Pt is scheduled to return Pulmonary Rehab on 08/31/24 at 915. Pt was advised to come to class 15 minutes before class starts.  Discussed RPE/Dpysnea scales. Patient participated in warm up stretches. Patient was able to  complete 6 minute walk test. Patient was measured for the equipment. Discussed equipment safety with patient. Took patient pre-anthropometric measurements. Patient finished visit at 845.

## 2024-08-26 ENCOUNTER — Encounter (HOSPITAL_COMMUNITY): Payer: Self-pay

## 2024-08-26 DIAGNOSIS — I272 Pulmonary hypertension, unspecified: Secondary | ICD-10-CM

## 2024-08-26 NOTE — Progress Notes (Signed)
 Pulmonary Individual Treatment Plan  Patient Details  Name: Robert Baldwin MRN: [id number] Date of Birth: Sep 18, 1941 Referring Provider:   Flowsheet Row PULMONARY REHAB OTHER RESP ORIENTATION from 08/24/2024 in [facility] CARDIAC REHABILITATION  Referring Provider Rolan Barrack MD    Initial Encounter Date:  Flowsheet Row PULMONARY REHAB OTHER RESP ORIENTATION from 08/24/2024 in [facility] [place] CARDIAC REHABILITATION  Date 08/24/24    Visit Diagnosis: Pulmonary hypertension (HCC)  Patient's Home Medications on Admission:   Current Outpatient Medications:    aspirin  EC 81 MG tablet, Take 1 tablet (81 mg total) by mouth daily. Swallow whole., Disp: , Rfl:    atorvastatin  (LIPITOR) 40 MG tablet, Take 40 mg by mouth at bedtime., Disp: , Rfl:    cholecalciferol  (VITAMIN D ) 25 MCG (1000 UNIT) tablet, Take 1,000 Units by mouth daily., Disp: , Rfl:    FARXIGA  10 MG TABS tablet, TAKE 1 TABLET BY MOUTH DAILY, Disp: 30 tablet, Rfl: 4   fish oil-omega-3 fatty acids  1000 MG capsule, Take 1 g by mouth every evening., Disp: , Rfl:    hydrALAZINE  (APRESOLINE ) 25 MG tablet, TAKE 1 AND 1/2 TABLETS BY MOUTH THREE TIMES DAILY, Disp: 90 tablet, Rfl: 6   LOKELMA  10 g PACK packet, TAKE 10 GRAM BY MOUTH AS DIRECTED (Patient not taking: Reported on 08/19/2024), Disp: 11 packet, Rfl: 2   macitentan  (OPSUMIT ) 10 MG tablet, TAKE 1 TABLET BY MOUTH DAILY, Disp: 90 tablet, Rfl: 3   Misc Natural Products (GLUCOS-CHONDROIT-MSM COMPLEX) TABS, Take 2 tablets by mouth daily., Disp: , Rfl:    Multiple Vitamins-Minerals (MULTIVITAMIN WITH MINERALS) tablet, Take 1 tablet by mouth in the morning.  Centrum Silver, Disp: , Rfl:    niacin  500 MG tablet, Take 500 mg by mouth in the morning., Disp: , Rfl:    potassium chloride  (KLOR-CON  M) 10 MEQ tablet, TAKE 1 TABLET BY MOUTH DAILY, Disp: 90 tablet, Rfl: 3   tadalafil  (CIALIS ) 20 MG tablet, TAKE 2 TABLETS BY MOUTH DAILY, Disp: 180 tablet, Rfl: 3   torsemide  (DEMADEX ) 20 MG tablet, Take  3 tablets (60 mg total) by mouth daily., Disp: , Rfl:    warfarin (COUMADIN ) 5 MG tablet, Take 5-7.5 mg by mouth See admin instructions. Take 5 mg on Saturday,Sunday Tues, Wed, Thursday, and then  Take 7.5 mg on Monday,  Wednesday, and Friday, Disp: , Rfl:   Past Medical History: Past Medical History:  Diagnosis Date   (HFpEF) heart failure with preserved ejection fraction (HCC)    Arthritis    Atrial fibrillation (HCC)    Bradycardia    Carotid artery disease    Hyperlipidemia    Hypertension    Pulmonary hypertension (HCC)    Type 2 diabetes mellitus (HCC)     Tobacco Use: Social History   Tobacco Use  Smoking Status Never   Passive exposure: Never  Smokeless Tobacco Never  Tobacco Comments   Never smoke 05/18/22    Labs: Review Flowsheet  More data exists      Latest Ref Rng & Units 05/14/2023 05/21/2023 11/13/2023 11/14/2023 12/18/2023  Labs for ITP Cardiac and Pulmonary Rehab  Cholestrol 0 - 200 mg/dL 98  - - - 881   LDL (calc) 0 - 99 mg/dL 47  - - - 50   HDL-C >59 mg/dL 42  - - - 59   Trlycerides <150 mg/dL 44  - - - 45   Hemoglobin A1c 4.8 - 5.6 % - - - 7.4  -  PH, Arterial 7.35 -  7.45 - 7.402  7.415  - - -  PCO2 arterial 32 - 48 mmHg - 41.4  40.9  - - -  Bicarbonate 20.0 - 28.0 mmol/L - 25.7  26.2  31.9  - -  TCO2 22 - 32 mmol/L - 27  27  29   - -  O2 Saturation % - 73  75  86.4  - -    Details       Multiple values from one day are sorted in reverse-chronological order         Capillary Blood Glucose: Lab Results  Component Value Date   GLUCAP 124 (H) 02/05/2024   GLUCAP 156 (H) 02/05/2024   GLUCAP 155 (H) 02/03/2024   GLUCAP 130 (H) 01/29/2024   GLUCAP 138 (H) 01/29/2024     Pulmonary Assessment Scores:  Pulmonary Assessment Scores     Row Name 08/24/24 0843         ADL UCSD   ADL Phase Entry     SOB Score total 17     Rest 2     Walk 2     Stairs 2     Bath 0     Dress 0     Shop 0       CAT Score   CAT Score 11       mMRC  Score   mMRC Score 2       UCSD: Self-administered rating of dyspnea associated with activities of daily living (ADLs) 6-point scale (0 = not at all to 5 = maximal or unable to do because of breathlessness)  Scoring Scores range from 0 to 120.  Minimally important difference is 5 units  CAT: CAT can identify the health impairment of COPD patients and is better correlated with disease progression.  CAT has a scoring range of zero to 40. The CAT score is classified into four groups of low (less than 10), medium (10 - 20), high (21-30) and very high (31-40) based on the impact level of disease on health status. A CAT score over 10 suggests significant symptoms.  A worsening CAT score could be explained by an exacerbation, poor medication adherence, poor inhaler technique, or progression of COPD or comorbid conditions.  CAT MCID is 2 points  mMRC: mMRC (Modified Medical Research Council) Dyspnea Scale is used to assess the degree of baseline functional disability in patients of respiratory disease due to dyspnea. No minimal important difference is established. A decrease in score of 1 point or greater is considered a positive change.   Pulmonary Function Assessment:   Exercise Target Goals: Exercise Program Goal: Individual exercise prescription set using results from initial 6 min walk test and THRR while considering  patient's activity barriers and safety.   Exercise Prescription Goal: Initial exercise prescription builds to 30-45 minutes a day of aerobic activity, 2-3 days per week.  Home exercise guidelines will be given to patient during program as part of exercise prescription that the participant will acknowledge.  Activity Barriers & Risk Stratification:  Activity Barriers & Cardiac Risk Stratification - 08/24/24 0853       Activity Barriers & Cardiac Risk Stratification   Activity Barriers Deconditioning;Assistive Device;Shortness of Breath;History of Falls;Balance  Concerns;Muscular Weakness          6 Minute Walk:  6 Minute Walk     Row Name 08/24/24 0852         6 Minute Walk   Phase Initial     Distance 710 feet  Walk Time 5.75 minutes     # of Rest Breaks 1  15 sec to tie shoe     MPH 1.4     METS 1.03     RPE 15     Perceived Dyspnea  2     VO2 Peak 3.6     Symptoms Yes (comment)     Comments SOB, fatigue     Resting HR 40 bpm     Resting BP 160/60     Resting Oxygen  Saturation  94 %     Exercise Oxygen  Saturation  during 6 min walk 86 %     Max Ex. HR 68 bpm     Max Ex. BP 170/64     2 Minute Post BP 124/66       Interval HR   1 Minute HR 54     2 Minute HR 54     3 Minute HR 56     4 Minute HR 62     5 Minute HR 58     6 Minute HR 57     2 Minute Post HR 44     Interval Heart Rate? Yes       Interval Oxygen    Interval Oxygen ? Yes     Baseline Oxygen  Saturation % 94 %     1 Minute Oxygen  Saturation % 87 %     1 Minute Liters of Oxygen  0 L  Room Air     2 Minute Oxygen  Saturation % 87 %     2 Minute Liters of Oxygen  0 L     3 Minute Oxygen  Saturation % 86 %     3 Minute Liters of Oxygen  0 L     4 Minute Oxygen  Saturation % 87 %     4 Minute Liters of Oxygen  0 L     5 Minute Oxygen  Saturation % 87 %     5 Minute Liters of Oxygen  0 L     6 Minute Oxygen  Saturation % 87 %     6 Minute Liters of Oxygen  0 L     2 Minute Post Oxygen  Saturation % 90 %     2 Minute Post Liters of Oxygen  0 L        Oxygen  Initial Assessment:  Oxygen  Initial Assessment - 08/24/24 0857       Home Oxygen    Home Oxygen  Device None    Sleep Oxygen  Prescription None    Home Exercise Oxygen  Prescription None    Home Resting Oxygen  Prescription None      Initial 6 min Walk   Oxygen  Used None      Program Oxygen  Prescription   Program Oxygen  Prescription None      Intervention   Short Term Goals To learn and exhibit compliance with exercise, home and travel O2 prescription;To learn and understand importance of monitoring  SPO2 with pulse oximeter and demonstrate accurate use of the pulse oximeter.;To learn and understand importance of maintaining oxygen  saturations>88%;To learn and demonstrate proper pursed lip breathing techniques or other breathing techniques.     Long  Term Goals Exhibits compliance with exercise, home  and travel O2 prescription;Maintenance of O2 saturations>88%;Exhibits proper breathing techniques, such as pursed lip breathing or other method taught during program session;Verbalizes importance of monitoring SPO2 with pulse oximeter and return demonstration          Oxygen  Re-Evaluation:   Oxygen  Discharge (Final Oxygen  Re-Evaluation):   Initial Exercise Prescription:  Initial Exercise  Prescription - 08/24/24 0800       Date of Initial Exercise RX and Referring Provider   Date 08/24/24    Referring Provider Rolan Barrack MD      Oxygen    Maintain Oxygen  Saturation 88% or higher      NuStep   Level 1    SPM 80    Minutes 15    METs 1.5      Track   Laps 9    Minutes 15    METs 1.5      Prescription Details   Frequency (times per week) 2    Duration Progress to 30 minutes of continuous aerobic without signs/symptoms of physical distress      Intensity   THRR 40-80% of Max Heartrate 79-118    Ratings of Perceived Exertion 11-13    Perceived Dyspnea 0-4      Progression   Progression Continue to progress workloads to maintain intensity without signs/symptoms of physical distress.      Resistance Training   Training Prescription Yes    Weight 3 lb    Reps 10-15          Perform Capillary Blood Glucose checks as needed.  Exercise Prescription Changes:   Exercise Prescription Changes     Row Name 08/24/24 0800             Response to Exercise   Blood Pressure (Admit) 160/60       Blood Pressure (Exercise) 170/64       Blood Pressure (Exit) 124/62       Heart Rate (Admit) 40 bpm       Heart Rate (Exercise) 68 bpm       Heart Rate (Exit) 44 bpm        Oxygen  Saturation (Admit) 94 %       Oxygen  Saturation (Exercise) 86 %       Oxygen  Saturation (Exit) 91 %       Rating of Perceived Exertion (Exercise) 15       Perceived Dyspnea (Exercise) 2       Symptoms SOb, fatigue       Comments walk test results          Exercise Comments:   Exercise Goals and Review:   Exercise Goals     Row Name 08/24/24 0855             Exercise Goals   Increase Physical Activity Yes       Intervention Provide advice, education, support and counseling about physical activity/exercise needs.;Develop an individualized exercise prescription for aerobic and resistive training based on initial evaluation findings, risk stratification, comorbidities and participant's personal goals.       Expected Outcomes Short Term: Attend rehab on a regular basis to increase amount of physical activity.;Long Term: Add in home exercise to make exercise part of routine and to increase amount of physical activity.;Long Term: Exercising regularly at least 3-5 days a week.       Increase Strength and Stamina Yes       Intervention Provide advice, education, support and counseling about physical activity/exercise needs.;Develop an individualized exercise prescription for aerobic and resistive training based on initial evaluation findings, risk stratification, comorbidities and participant's personal goals.       Expected Outcomes Short Term: Increase workloads from initial exercise prescription for resistance, speed, and METs.;Short Term: Perform resistance training exercises routinely during rehab and add in resistance training at home;Long Term: Improve cardiorespiratory fitness, muscular endurance  and strength as measured by increased METs and functional capacity ( )       Able to understand and use rate of perceived exertion (RPE) scale Yes       Intervention Provide education and explanation on how to use RPE scale       Expected Outcomes Short Term: Able to use RPE daily  in rehab to express subjective intensity level;Long Term:  Able to use RPE to guide intensity level when exercising independently       Able to understand and use Dyspnea scale Yes       Intervention Provide education and explanation on how to use Dyspnea scale       Expected Outcomes Short Term: Able to use Dyspnea scale daily in rehab to express subjective sense of shortness of breath during exertion;Long Term: Able to use Dyspnea scale to guide intensity level when exercising independently       Knowledge and understanding of Target Heart Rate Range (THRR) Yes       Intervention Provide education and explanation of THRR including how the numbers were predicted and where they are located for reference       Expected Outcomes Short Term: Able to use daily as guideline for intensity in rehab;Long Term: Able to use THRR to govern intensity when exercising independently;Short Term: Able to state/look up THRR       Able to check pulse independently Yes       Intervention Provide education and demonstration on how to check pulse in carotid and radial arteries.;Review the importance of being able to check your own pulse for safety during independent exercise       Expected Outcomes Short Term: Able to explain why pulse checking is important during independent exercise;Long Term: Able to check pulse independently and accurately       Understanding of Exercise Prescription Yes       Intervention Provide education, explanation, and written materials on patient's individual exercise prescription       Expected Outcomes Short Term: Able to explain program exercise prescription;Long Term: Able to explain home exercise prescription to exercise independently          Exercise Goals Re-Evaluation :   Discharge Exercise Prescription (Final Exercise Prescription Changes):  Exercise Prescription Changes - 08/24/24 0800       Response to Exercise   Blood Pressure (Admit) 160/60    Blood Pressure (Exercise)  170/64    Blood Pressure (Exit) 124/62    Heart Rate (Admit) 40 bpm    Heart Rate (Exercise) 68 bpm    Heart Rate (Exit) 44 bpm    Oxygen  Saturation (Admit) 94 %    Oxygen  Saturation (Exercise) 86 %    Oxygen  Saturation (Exit) 91 %    Rating of Perceived Exertion (Exercise) 15    Perceived Dyspnea (Exercise) 2    Symptoms SOb, fatigue    Comments walk test results          Nutrition:  Target Goals: Understanding of nutrition guidelines, daily intake of sodium 1500mg , cholesterol 200mg , calories 30% from fat and 7% or less from saturated fats, daily to have 5 or more servings of fruits and vegetables.  Biometrics:  Pre Biometrics - 08/24/24 0855       Pre Biometrics   Height 5' 6 (1.676 m)    Weight 170 lb 11.2 oz (77.4 kg)    Waist Circumference 39.25 inches    Hip Circumference 40 inches    Waist to Hip  Ratio 0.98 %    BMI (Calculated) 27.56    Grip Strength 17.4 kg    Single Leg Stand 2.9 seconds           Nutrition Therapy Plan and Nutrition Goals:  Nutrition Therapy & Goals - 08/24/24 0856       Intervention Plan   Intervention Prescribe, educate and counsel regarding individualized specific dietary modifications aiming towards targeted core components such as weight, hypertension, lipid management, diabetes, heart failure and other comorbidities.;Nutrition handout(s) given to patient.    Expected Outcomes Short Term Goal: Understand basic principles of dietary content, such as calories, fat, sodium, cholesterol and nutrients.;Long Term Goal: Adherence to prescribed nutrition plan.          Nutrition Assessments:  MEDIFICTS Score Key: >=70 Need to make dietary changes  40-70 Heart Healthy Diet <= 40 Therapeutic Level Cholesterol Diet  Flowsheet Row PULMONARY REHAB OTHER RESP ORIENTATION from 08/24/2024 in [facility] CARDIAC REHABILITATION  Picture Your Plate Total Score on Admission 65   Picture Your Plate Scores: <59 Unhealthy dietary pattern with  much room for improvement. 41-50 Dietary pattern unlikely to meet recommendations for good health and room for improvement. 51-60 More healthful dietary pattern, with some room for improvement.  >60 Healthy dietary pattern, although there may be some specific behaviors that could be improved.    Nutrition Goals Re-Evaluation:   Nutrition Goals Discharge (Final Nutrition Goals Re-Evaluation):   Psychosocial: Target Goals: Acknowledge presence or absence of significant depression and/or stress, maximize coping skills, provide positive support system. Participant is able to verbalize types and ability to use techniques and skills needed for reducing stress and depression.  Initial Review & Psychosocial Screening:  Initial Psych Review & Screening - 08/19/24 1022       Family Dynamics   Good Support System? Yes    Comments Patient's wife is his main support.      Barriers   Psychosocial barriers to participate in program There are no identifiable barriers or psychosocial needs.;The patient should benefit from training in stress management and relaxation.      Screening Interventions   Interventions Encouraged to exercise;To provide support and resources with identified psychosocial needs;Provide feedback about the scores to participant    Expected Outcomes Short Term goal: Utilizing psychosocial counselor, staff and physician to assist with identification of specific Stressors or current issues interfering with healing process. Setting desired goal for each stressor or current issue identified.;Long Term Goal: Stressors or current issues are controlled or eliminated.;Short Term goal: Identification and review with participant of any Quality of Life or Depression concerns found by scoring the questionnaire.;Long Term goal: The participant improves quality of Life and PHQ9 Scores as seen by post scores and/or verbalization of changes          Quality of Life Scores:  Scores of 19 and  below usually indicate a poorer quality of life in these areas.  A difference of  2-3 points is a clinically meaningful difference.  A difference of 2-3 points in the total score of the Quality of Life Index has been associated with significant improvement in overall quality of life, self-image, physical symptoms, and general health in studies assessing change in quality of life.  PHQ-9: Review Flowsheet       08/24/2024 04/29/2024 01/23/2024  Depression screen PHQ 2/9  Decreased Interest 0 0 0  Down, Depressed, Hopeless 0 0 0  PHQ - 2 Score 0 0 0  Altered sleeping 0 0 0  Tired, decreased energy 1 1 0  Change in appetite 0 0 0  Feeling bad or failure about yourself  0 0 0  Trouble concentrating 0 0 0  Moving slowly or fidgety/restless 0 0 0  Suicidal thoughts 0 0 0  PHQ-9 Score 1 1 0  Difficult doing work/chores Not difficult at all Not difficult at all -   Interpretation of Total Score  Total Score Depression Severity:  1-4 = Minimal depression, 5-9 = Mild depression, 10-14 = Moderate depression, 15-19 = Moderately severe depression, 20-27 = Severe depression   Psychosocial Evaluation and Intervention:   Psychosocial Re-Evaluation:   Psychosocial Discharge (Final Psychosocial Re-Evaluation):   Education: Education Goals: Education classes will be provided on a weekly basis, covering required topics. Participant will state understanding/return demonstration of topics presented.  Learning Barriers/Preferences:  Learning Barriers/Preferences - 08/19/24 1027       Learning Barriers/Preferences   Learning Barriers Sight    Learning Preferences Pictoral;Written Material          Education Topics: Know Your Numbers Group instruction that is supported by a PowerPoint presentation. Instructor discusses importance of knowing and understanding resting, exercise, and post-exercise oxygen  saturation, heart rate, and blood pressure. Oxygen  saturation, heart rate, blood pressure,  rating of perceived exertion, and dyspnea are reviewed along with a normal range for these values.    Exercise for the Pulmonary Patient Group instruction that is supported by a PowerPoint presentation. Instructor discusses benefits of exercise, core components of exercise, frequency, duration, and intensity of an exercise routine, importance of utilizing pulse oximetry during exercise, safety while exercising, and options of places to exercise outside of rehab.    MET Level  Group instruction provided by PowerPoint, verbal discussion, and written material to support subject matter. Instructor reviews what METs are and how to increase METs.    Pulmonary Medications Verbally interactive group education provided by instructor with focus on inhaled medications and proper administration.   Anatomy and Physiology of the Respiratory System Group instruction provided by PowerPoint, verbal discussion, and written material to support subject matter. Instructor reviews respiratory cycle and anatomical components of the respiratory system and their functions. Instructor also reviews differences in obstructive and restrictive respiratory diseases with examples of each.    Oxygen  Safety Group instruction provided by PowerPoint, verbal discussion, and written material to support subject matter. There is an overview of "What is Oxygen " and "Why do we need it".  Instructor also reviews how to create a safe environment for oxygen  use, the importance of using oxygen  as prescribed, and the risks of noncompliance. There is a brief discussion on traveling with oxygen  and resources the patient may utilize.   Oxygen  Use Group instruction provided by PowerPoint, verbal discussion, and written material to discuss how supplemental oxygen  is prescribed and different types of oxygen  supply systems. Resources for more information are provided.    Breathing Techniques Group instruction that is supported by  demonstration and informational handouts. Instructor discusses the benefits of pursed lip and diaphragmatic breathing and detailed demonstration on how to perform both.     Risk Factor Reduction Group instruction that is supported by a PowerPoint presentation. Instructor discusses the definition of a risk factor, different risk factors for pulmonary disease, and how the heart and lungs work together.   Pulmonary Diseases Group instruction provided by PowerPoint, verbal discussion, and written material to support subject matter. Instructor gives an overview of the different type of pulmonary diseases. There is also a discussion on risk  factors and symptoms as well as ways to manage the diseases.   Stress and Energy Conservation Group instruction provided by PowerPoint, verbal discussion, and written material to support subject matter. Instructor gives an overview of stress and the impact it can have on the body. Instructor also reviews ways to reduce stress. There is also a discussion on energy conservation and ways to conserve energy throughout the day.   Warning Signs and Symptoms Group instruction provided by PowerPoint, verbal discussion, and written material to support subject matter. Instructor reviews warning signs and symptoms of stroke, heart attack, cold and flu. Instructor also reviews ways to prevent the spread of infection.   Other Education Group or individual verbal, written, or video instructions that support the educational goals of the pulmonary rehab program.    Knowledge Questionnaire Score:  Knowledge Questionnaire Score - 08/24/24 0838       Knowledge Questionnaire Score   Pre Score 12/18          Core Components/Risk Factors/Patient Goals at Admission:  Personal Goals and Risk Factors at Admission - 08/24/24 0856       Core Components/Risk Factors/Patient Goals on Admission    Weight Management Obesity;Weight Maintenance;Yes    Intervention Weight  Management: Develop a combined nutrition and exercise program designed to reach desired caloric intake, while maintaining appropriate intake of nutrient and fiber, sodium and fats, and appropriate energy expenditure required for the weight goal.;Weight Management: Provide education and appropriate resources to help participant work on and attain dietary goals.;Weight Management/Obesity: Establish reasonable short term and long term weight goals.;Obesity: Provide education and appropriate resources to help participant work on and attain dietary goals.    Admit Weight 170 lb 11.2 oz (77.4 kg)    Goal Weight: Short Term 165 lb (74.8 kg)    Goal Weight: Long Term 160 lb (72.6 kg)    Expected Outcomes Short Term: Continue to assess and modify interventions until short term weight is achieved;Long Term: Adherence to nutrition and physical activity/exercise program aimed toward attainment of established weight goal;Weight Maintenance: Understanding of the daily nutrition guidelines, which includes 25-35% calories from fat, 7% or less cal from saturated fats, less than 200mg  cholesterol, less than 1.5gm of sodium, & 5 or more servings of fruits and vegetables daily;Understanding of distribution of calorie intake throughout the day with the consumption of 4-5 meals/snacks;Understanding recommendations for meals to include 15-35% energy as protein, 25-35% energy from fat, 35-60% energy from carbohydrates, less than 200mg  of dietary cholesterol, 20-35 gm of total fiber daily    Improve shortness of breath with ADL's Yes    Intervention Provide education, individualized exercise plan and daily activity instruction to help decrease symptoms of SOB with activities of daily living.    Expected Outcomes Short Term: Improve cardiorespiratory fitness to achieve a reduction of symptoms when performing ADLs;Long Term: Be able to perform more ADLs without symptoms or delay the onset of symptoms    Increase knowledge of  respiratory medications and ability to use respiratory devices properly  Yes    Intervention Provide education and demonstration as needed of appropriate use of medications, inhalers, and oxygen  therapy.    Expected Outcomes Short Term: Achieves understanding of medications use. Understands that oxygen  is a medication prescribed by physician. Demonstrates appropriate use of inhaler and oxygen  therapy.;Long Term: Maintain appropriate use of medications, inhalers, and oxygen  therapy.    Diabetes Yes    Intervention Provide education about proper nutrition, including hydration, and aerobic/resistive exercise prescription along with  prescribed medications to achieve blood glucose in normal ranges: Fasting glucose 65-99 mg/dL;Provide education about signs/symptoms and action to take for hypo/hyperglycemia.    Expected Outcomes Short Term: Participant verbalizes understanding of the signs/symptoms and immediate care of hyper/hypoglycemia, proper foot care and importance of medication, aerobic/resistive exercise and nutrition plan for blood glucose control.;Long Term: Attainment of HbA1C < 7%.    Heart Failure Yes    Intervention Provide a combined exercise and nutrition program that is supplemented with education, support and counseling about heart failure. Directed toward relieving symptoms such as shortness of breath, decreased exercise tolerance, and extremity edema.    Expected Outcomes Improve functional capacity of life;Short term: Attendance in program 2-3 days a week with increased exercise capacity. Reported lower sodium intake. Reported increased fruit and vegetable intake. Reports medication compliance.;Short term: Daily weights obtained and reported for increase. Utilizing diuretic protocols set by physician.;Long term: Adoption of self-care skills and reduction of barriers for early signs and symptoms recognition and intervention leading to self-care maintenance.    Hypertension Yes    Intervention  Monitor prescription use compliance.;Provide education on lifestyle modifcations including regular physical activity/exercise, weight management, moderate sodium restriction and increased consumption of fresh fruit, vegetables, and low fat dairy, alcohol  moderation, and smoking cessation.    Expected Outcomes Short Term: Continued assessment and intervention until BP is < 140/60mm HG in hypertensive participants. < 130/23mm HG in hypertensive participants with diabetes, heart failure or chronic kidney disease.;Long Term: Maintenance of blood pressure at goal levels.    Lipids Yes    Intervention Provide education and support for participant on nutrition & aerobic/resistive exercise along with prescribed medications to achieve LDL 70mg , HDL >40mg .    Expected Outcomes Short Term: Participant states understanding of desired cholesterol values and is compliant with medications prescribed. Participant is following exercise prescription and nutrition guidelines.;Long Term: Cholesterol controlled with medications as prescribed, with individualized exercise RX and with personalized nutrition plan. Value goals: LDL < 70mg , HDL > 40 mg.          Core Components/Risk Factors/Patient Goals Review:    Core Components/Risk Factors/Patient Goals at Discharge (Final Review):    ITP Comments:  ITP Comments     Row Name 08/24/24 0849 08/26/24 0810         ITP Comments Patient arrived for 1st visit/orientation/education at 0800. Patient was referred to PR by Dr. Ezra Hoffman due to Pulmonary HTN. During orientation advised patient on arrival and appointment times what to wear, what to do before, during and after exercise. Reviewed attendance and class policy.  Pt is scheduled to return Pulmonary Rehab on 08/31/24 at 915. Pt was advised to come to class 15 minutes before class starts.  Discussed RPE/Dpysnea scales. Patient participated in warm up stretches. Patient was able to complete 6 minute walk test.  Patient was measured for the equipment. Discussed equipment safety with patient. Took patient pre-anthropometric measurements. Patient finished visit at 845. 30 day review completed. ITP sent to Dr.Jehanzeb Memon, Medical Director of  Pulmonary Rehab. Continue with ITP unless changes are made by physician. Pt new to the program, has not started yet.         Comments: 30 day review

## 2024-08-31 ENCOUNTER — Encounter (HOSPITAL_COMMUNITY)
Admission: RE | Admit: 2024-08-31 | Discharge: 2024-08-31 | Disposition: A | Source: Ambulatory Visit | Attending: Cardiology

## 2024-08-31 DIAGNOSIS — I272 Pulmonary hypertension, unspecified: Secondary | ICD-10-CM | POA: Diagnosis not present

## 2024-08-31 NOTE — Progress Notes (Signed)
 Daily Session Note  Patient Details  Name: Robert Baldwin MRN: [id number] Date of Birth: 21-Oct-1941 Referring Provider:   Flowsheet Row PULMONARY REHAB OTHER RESP ORIENTATION from 08/24/2024 in [facility] CARDIAC REHABILITATION  Referring Provider Rolan Barrack MD    Encounter Date: 08/31/2024  Check In:  Session Check In - 08/31/24 0904       Check-In   Supervising physician immediately available to respond to emergencies See telemetry face sheet for immediately available MD    Location [facility]    Staff Present Laymon Rattler, BSN, RN, WTA-C;Heather Con, BS, Exercise Physiologist    Virtual Visit No    Medication changes reported     No    Fall or balance concerns reported    No    Tobacco Cessation No Change    Warm-up and Cool-down Performed on first and last piece of equipment    Resistance Training Performed Yes    VAD Patient? No    PAD/SET Patient? No      Pain Assessment   Currently in Pain? No/denies          Capillary Blood Glucose: No results found for this or any previous visit (from the past 24 hours).    Social History   Tobacco Use  Smoking Status Never   Passive exposure: Never  Smokeless Tobacco Never  Tobacco Comments   Never smoke 05/18/22    Goals Met:  Proper associated with RPD/PD & O2 Sat Independence with exercise equipment Improved SOB with ADL's Using PLB without cueing & demonstrates good technique Exercise tolerated well No report of concerns or symptoms today Strength training completed today  Goals Unmet:  Not Applicable  Comments: First full day of exercise!  Patient was oriented to gym and equipment including functions, settings, policies, and procedures.  Patient's individual exercise prescription and treatment plan were reviewed.  All starting workloads were established based on the results of the 6 minute walk test done at initial orientation visit.  The plan for exercise progression was also  introduced and progression will be customized based on patient's performance and goals.

## 2024-09-02 ENCOUNTER — Encounter (HOSPITAL_COMMUNITY)
Admission: RE | Admit: 2024-09-02 | Discharge: 2024-09-02 | Disposition: A | Discharge status: Home / Self Care | Source: Ambulatory Visit | Attending: Cardiology | Admitting: Cardiology

## 2024-09-02 DIAGNOSIS — I272 Pulmonary hypertension, unspecified: Secondary | ICD-10-CM

## 2024-09-02 NOTE — Progress Notes (Signed)
 Daily Session Note  Patient Details  Name: Robert Baldwin MRN: [id number] Date of Birth: [birth year] Referring Provider:   Flowsheet Row PULMONARY REHAB OTHER RESP ORIENTATION from 08/24/2024 in [facility] CARDIAC REHABILITATION  Referring Provider Rolan Barrack MD    Encounter Date: 09/02/2024  Check In:  Session Check In - 09/02/24 0908       Check-In   Supervising physician immediately available to respond to emergencies See telemetry face sheet for immediately available MD    Location [facility]    Staff Present Laymon Rattler, BSN, RN, WTA-C;Heather Con, BS, Exercise Physiologist;Jessica Vonzell, MA, RCEP, CCRP, CCET    Virtual Visit No    Medication changes reported     No    Fall or balance concerns reported    No    Tobacco Cessation No Change    Warm-up and Cool-down Performed on first and last piece of equipment    Resistance Training Performed Yes    VAD Patient? No    PAD/SET Patient? No      Pain Assessment   Currently in Pain? No/denies          Capillary Blood Glucose: No results found for this or any previous visit (from the past 24 hours).    Social History   Tobacco Use  Smoking Status Never   Passive exposure: Never  Smokeless Tobacco Never  Tobacco Comments   Never smoke 05/18/22    Goals Met:  Proper associated with RPD/PD & O2 Sat Independence with exercise equipment Improved SOB with ADL's Using PLB without cueing & demonstrates good technique Exercise tolerated well No report of concerns or symptoms today Strength training completed today  Goals Unmet:  Not Applicable  Comments: Pt able to follow exercise prescription today without complaint.  Will continue to monitor for progression.

## 2024-09-03 ENCOUNTER — Other Ambulatory Visit (HOSPITAL_COMMUNITY): Payer: Self-pay

## 2024-09-07 ENCOUNTER — Encounter (HOSPITAL_COMMUNITY)
Admission: RE | Admit: 2024-09-07 | Discharge: 2024-09-07 | Disposition: A | Source: Ambulatory Visit | Attending: Cardiology

## 2024-09-07 DIAGNOSIS — I272 Pulmonary hypertension, unspecified: Secondary | ICD-10-CM

## 2024-09-07 NOTE — Progress Notes (Signed)
 Daily Session Note  Patient Details  Name: Robert Baldwin MRN: [id number] Date of Birth: 23-Jul-1941 Referring Provider:   Flowsheet Row PULMONARY REHAB OTHER RESP ORIENTATION from 08/24/2024 in [facility] CARDIAC REHABILITATION  Referring Provider Rolan Barrack MD    Encounter Date: 09/07/2024  Check In:  Session Check In - 09/07/24 0934       Check-In   Supervising physician immediately available to respond to emergencies See telemetry face sheet for immediately available MD    Location [facility]    Staff Present Laymon Rattler, BSN, RN, WTA-C;Nesbit Michon Zina, RN    Virtual Visit No    Medication changes reported     No    Fall or balance concerns reported    No    Warm-up and Cool-down Performed on first and last piece of equipment    Resistance Training Performed Yes    VAD Patient? No    PAD/SET Patient? No      Pain Assessment   Currently in Pain? No/denies          Capillary Blood Glucose: No results found for this or any previous visit (from the past 24 hours).    Social History   Tobacco Use  Smoking Status Never   Passive exposure: Never  Smokeless Tobacco Never  Tobacco Comments   Never smoke 05/18/22    Goals Met:  Proper associated with RPD/PD & O2 Sat Independence with exercise equipment Using PLB without cueing & demonstrates good technique Exercise tolerated well No report of concerns or symptoms today Strength training completed today  Goals Unmet:  Not Applicable  Comments: Pt able to follow exercise prescription today without complaint.  Will continue to monitor for progression.

## 2024-09-09 ENCOUNTER — Encounter (HOSPITAL_COMMUNITY)
Admission: RE | Admit: 2024-09-09 | Discharge: 2024-09-09 | Disposition: A | Source: Ambulatory Visit | Attending: Cardiology

## 2024-09-09 DIAGNOSIS — I272 Pulmonary hypertension, unspecified: Secondary | ICD-10-CM | POA: Diagnosis not present

## 2024-09-09 NOTE — Progress Notes (Signed)
 Daily Session Note  Patient Details  Name: Robert Baldwin MRN: [id number] Date of Birth: Jun 07, 1941 Referring Provider:   Flowsheet Row PULMONARY REHAB OTHER RESP ORIENTATION from 08/24/2024 in [facility] CARDIAC REHABILITATION  Referring Provider Rolan Barrack MD    Encounter Date: 09/09/2024  Check In:  Session Check In - 09/09/24 0905       Check-In   Supervising physician immediately available to respond to emergencies See telemetry face sheet for immediately available MD    Location [facility]    Staff Present Laymon Rattler, BSN, RN, Rosalba Gelineau, MA, RCEP, CCRP, CCET;Heather [place], [place], Exercise Physiologist    Virtual Visit No    Medication changes reported     No    Fall or balance concerns reported    No    Tobacco Cessation No Change    Warm-up and Cool-down Performed on first and last piece of equipment    Resistance Training Performed Yes    VAD Patient? No    PAD/SET Patient? No      Pain Assessment   Currently in Pain? No/denies          Capillary Blood Glucose: No results found for this or any previous visit (from the past 24 hours).    Social History   Tobacco Use  Smoking Status Never   Passive exposure: Never  Smokeless Tobacco Never  Tobacco Comments   Never smoke 05/18/22    Goals Met:  Proper associated with RPD/PD & O2 Sat Independence with exercise equipment Improved SOB with ADL's Using PLB without cueing & demonstrates good technique Exercise tolerated well No report of concerns or symptoms today Strength training completed today  Goals Unmet:  Not Applicable  Comments: Pt able to follow exercise prescription today without complaint.  Will continue to monitor for progression.

## 2024-09-14 ENCOUNTER — Encounter (HOSPITAL_COMMUNITY)
Admission: RE | Admit: 2024-09-14 | Discharge: 2024-09-14 | Disposition: A | Discharge status: Home / Self Care | Source: Ambulatory Visit | Attending: Cardiology | Admitting: Cardiology

## 2024-09-14 DIAGNOSIS — I272 Pulmonary hypertension, unspecified: Secondary | ICD-10-CM

## 2024-09-14 NOTE — Progress Notes (Signed)
 Daily Session Note  Patient Details  Name: Robert Baldwin MRN: [id number] Date of Birth: 17-Mar-1941 Referring Provider:   Flowsheet Row PULMONARY REHAB OTHER RESP ORIENTATION from 08/24/2024 in [facility] CARDIAC REHABILITATION  Referring Provider Rolan Barrack MD    Encounter Date: 09/14/2024  Check In:  Session Check In - 09/14/24 0900       Check-In   Supervising physician immediately available to respond to emergencies See telemetry face sheet for immediately available MD    Location [facility]    Staff Present Laymon Rattler, BSN, RN, Rosalba Gelineau, MA, RCEP, CCRP, CCET;Heather [place], [place], Exercise Physiologist    Virtual Visit No    Medication changes reported     No    Fall or balance concerns reported    No    Tobacco Cessation No Change    Warm-up and Cool-down Performed on first and last piece of equipment    Resistance Training Performed Yes    VAD Patient? No    PAD/SET Patient? No      Pain Assessment   Currently in Pain? No/denies          Capillary Blood Glucose: No results found for this or any previous visit (from the past 24 hours).    Social History   Tobacco Use  Smoking Status Never   Passive exposure: Never  Smokeless Tobacco Never  Tobacco Comments   Never smoke 05/18/22    Goals Met:  Proper associated with RPD/PD & O2 Sat Independence with exercise equipment Improved SOB with ADL's Using PLB without cueing & demonstrates good technique Exercise tolerated well No report of concerns or symptoms today Strength training completed today  Goals Unmet:  Not Applicable  Comments: Pt able to follow exercise prescription today without complaint.  Will continue to monitor for progression.

## 2024-09-16 ENCOUNTER — Encounter (HOSPITAL_COMMUNITY)
Admission: RE | Admit: 2024-09-16 | Discharge: 2024-09-16 | Disposition: A | Source: Ambulatory Visit | Attending: Cardiology

## 2024-09-16 DIAGNOSIS — I272 Pulmonary hypertension, unspecified: Secondary | ICD-10-CM | POA: Diagnosis not present

## 2024-09-16 NOTE — Progress Notes (Signed)
 Daily Session Note  Patient Details  Name: Robert Baldwin MRN: [id number] Date of Birth: [birth year] Referring Provider:   Flowsheet Row PULMONARY REHAB OTHER RESP ORIENTATION from 08/24/2024 in [facility] CARDIAC REHABILITATION  Referring Provider Rolan Barrack MD    Encounter Date: 09/16/2024  Check In:  Session Check In - 09/16/24 9077       Check-In   Supervising physician immediately available to respond to emergencies See telemetry face sheet for immediately available MD    Location [facility]    Staff Present Laymon Rattler, BSN, RN, WTA-C;Heather Con [place]EC[place], Exercise Physiologist;Hillary Troutman BSN, RN    Virtual Visit No    Medication changes reported     No    Fall or balance concerns reported    No    Tobacco Cessation No Change    Warm-up and Cool-down Performed on first and last piece of equipment    Resistance Training Performed Yes    VAD Patient? No    PAD/SET Patient? No      Pain Assessment   Currently in Pain? No/denies          Capillary Blood Glucose: No results found for this or any previous visit (from the past 24 hours).    Social History   Tobacco Use  Smoking Status Never   Passive exposure: Never  Smokeless Tobacco Never  Tobacco Comments   Never smoke 05/18/22    Goals Met:  Proper associated with RPD/PD & O2 Sat Independence with exercise equipment Improved SOB with ADL's Using PLB without cueing & demonstrates good technique Exercise tolerated well No report of concerns or symptoms today Strength training completed today  Goals Unmet:  Not Applicable  Comments: Pt able to follow exercise prescription today without complaint.  Will continue to monitor for progression.

## 2024-09-21 ENCOUNTER — Encounter (HOSPITAL_COMMUNITY)
Admission: RE | Admit: 2024-09-21 | Discharge: 2024-09-21 | Disposition: A | Source: Ambulatory Visit | Attending: Cardiology

## 2024-09-21 DIAGNOSIS — I272 Pulmonary hypertension, unspecified: Secondary | ICD-10-CM | POA: Diagnosis present

## 2024-09-21 NOTE — Progress Notes (Signed)
 Daily Session Note  Patient Details  Name: Robert Baldwin MRN: [id number] Date of Birth: Mar 22, 1941 Referring Provider:   Flowsheet Row PULMONARY REHAB OTHER RESP ORIENTATION from 08/24/2024 in [facility] CARDIAC REHABILITATION  Referring Provider Rolan Barrack MD    Encounter Date: 09/21/2024  Check In:  Session Check In - 09/21/24 0915       Check-In   Supervising physician immediately available to respond to emergencies See telemetry face sheet for immediately available MD    Location [facility]    Staff Present Richerd Buddle, RN;Sabrie Moritz Vicci, RN, BSN;Heather Con, BS, Exercise Physiologist    Virtual Visit No    Medication changes reported     No    Fall or balance concerns reported    No    Warm-up and Cool-down Performed on first and last piece of equipment    Resistance Training Performed Yes    VAD Patient? No    PAD/SET Patient? No      Pain Assessment   Currently in Pain? No/denies    Multiple Pain Sites No          Capillary Blood Glucose: No results found for this or any previous visit (from the past 24 hours).    Social History   Tobacco Use  Smoking Status Never   Passive exposure: Never  Smokeless Tobacco Never  Tobacco Comments   Never smoke 05/18/22    Goals Met:  Proper associated with RPD/PD & O2 Sat Independence with exercise equipment Using PLB without cueing & demonstrates good technique Exercise tolerated well No report of concerns or symptoms today Strength training completed today  Goals Unmet:  Not Applicable  Comments: Pt able to follow exercise prescription today without complaint.  Will continue to monitor for progression.

## 2024-09-22 ENCOUNTER — Encounter (HOSPITAL_COMMUNITY): Payer: Self-pay | Admitting: *Deleted

## 2024-09-22 DIAGNOSIS — I272 Pulmonary hypertension, unspecified: Secondary | ICD-10-CM

## 2024-09-22 NOTE — Progress Notes (Signed)
 Pulmonary Individual Treatment Plan  Patient Details  Name: Robert Baldwin MRN: [id number] Date of Birth: 21-Mar-1941 Referring Provider:   Flowsheet Row PULMONARY REHAB OTHER RESP ORIENTATION from 08/24/2024 in [facility] CARDIAC REHABILITATION  Referring Provider Rolan Barrack MD    Initial Encounter Date:  Flowsheet Row PULMONARY REHAB OTHER RESP ORIENTATION from 08/24/2024 in [facility]ARDIAC REHABIL[facility]ATION  Date 08/24/24    Visit Diagnosis: Pulmonary hypertension (HCC)  Patient's Home Medications on Admission:   Current Outpatient Medications:    aspirin  EC 81 MG tablet, Take 1 tablet (81 mg total) by mouth daily. Swallow whole., Disp: , Rfl:    atorvastatin  (LIPITOR) 40 MG tablet, Take 40 mg by mouth at bedtime., Disp: , Rfl:    cholecalciferol  (VITAMIN D ) 25 MCG (1000 UNIT) tablet, Take 1,000 Units by mouth daily., Disp: , Rfl:    FARXIGA  10 MG TABS tablet, TAKE 1 TABLET BY MOUTH DAILY, Disp: 30 tablet, Rfl: 4   fish oil-omega-3 fatty acids  1000 MG capsule, Take 1 g by mouth every evening., Disp: , Rfl:    hydrALAZINE  (APRESOLINE ) 25 MG tablet, TAKE 1 AND 1/2 TABLETS BY MOUTH THREE TIMES DAILY, Disp: 90 tablet, Rfl: 6   LOKELMA  10 g PACK packet, TAKE 10 GRAM BY MOUTH AS DIRECTED (Patient not taking: Reported on 08/19/2024), Disp: 11 packet, Rfl: 2   macitentan  (OPSUMIT ) 10 MG tablet, TAKE 1 TABLET BY MOUTH DAILY, Disp: 90 tablet, Rfl: 3   Misc Natural Products (GLUCOS-CHONDROIT-MSM COMPLEX) TABS, Take 2 tablets by mouth daily., Disp: , Rfl:    Multiple Vitamins-Minerals (MULTIVITAMIN WITH MINERALS) tablet, Take 1 tablet by mouth in the morning.  Centrum Silver, Disp: , Rfl:    niacin  500 MG tablet, Take 500 mg by mouth in the morning., Disp: , Rfl:    potassium chloride  (KLOR-CON  M) 10 MEQ tablet, TAKE 1 TABLET BY MOUTH DAILY, Disp: 90 tablet, Rfl: 3   tadalafil  (CIALIS ) 20 MG tablet, TAKE 2 TABLETS BY MOUTH DAILY, Disp: 180 tablet, Rfl: 3   torsemide  (DEMADEX ) 20 MG tablet, Take  3 tablets (60 mg total) by mouth daily., Disp: , Rfl:    warfarin (COUMADIN ) 5 MG tablet, Take 5-7.5 mg by mouth See admin instructions. Take 5 mg on Saturday,Sunday Tues, Wed, Thursday, and then  Take 7.5 mg on Monday,  Wednesday, and Friday, Disp: , Rfl:   Past Medical History: Past Medical History:  Diagnosis Date   (HFpEF) heart failure with preserved ejection fraction (HCC)    Arthritis    Atrial fibrillation (HCC)    Bradycardia    Carotid artery disease    Hyperlipidemia    Hypertension    Pulmonary hypertension (HCC)    Type 2 diabetes mellitus (HCC)     Tobacco Use: Social History   Tobacco Use  Smoking Status Never   Passive exposure: Never  Smokeless Tobacco Never  Tobacco Comments   Never smoke 05/18/22    Labs: Review Flowsheet  More data exists      Latest Ref Rng & Units 05/14/2023 05/21/2023 11/13/2023 11/14/2023 12/18/2023  Labs for ITP Cardiac and Pulmonary Rehab  Cholestrol 0 - 200 mg/dL 98  - - - 881   LDL (calc) 0 - 99 mg/dL 47  - - - 50   HDL-C >59 mg/dL 42  - - - 59   Trlycerides <150 mg/dL 44  - - - 45   Hemoglobin A1c 4.8 - 5.6 % - - - 7.4  -  PH, Arterial 7.35 -  7.45 - 7.402  7.415  - - -  PCO2 arterial 32 - 48 mmHg - 41.4  40.9  - - -  Bicarbonate 20.0 - 28.0 mmol/L - 25.7  26.2  31.9  - -  TCO2 22 - 32 mmol/L - 27  27  29   - -  O2 Saturation % - 73  75  86.4  - -    Details       Multiple values from one day are sorted in reverse-chronological order         Capillary Blood Glucose: Lab Results  Component Value Date   GLUCAP 124 (H) 02/05/2024   GLUCAP 156 (H) 02/05/2024   GLUCAP 155 (H) 02/03/2024   GLUCAP 130 (H) 01/29/2024   GLUCAP 138 (H) 01/29/2024     Pulmonary Assessment Scores:  Pulmonary Assessment Scores     Row Name 08/24/24 0843         ADL UCSD   ADL Phase Entry     SOB Score total 17     Rest 2     Walk 2     Stairs 2     Bath 0     Dress 0     Shop 0       CAT Score   CAT Score 11       mMRC  Score   mMRC Score 2       UCSD: Self-administered rating of dyspnea associated with activities of daily living (ADLs) 6-point scale (0 = not at all to 5 = maximal or unable to do because of breathlessness)  Scoring Scores range from 0 to 120.  Minimally important difference is 5 units  CAT: CAT can identify the health impairment of COPD patients and is better correlated with disease progression.  CAT has a scoring range of zero to 40. The CAT score is classified into four groups of low (less than 10), medium (10 - 20), high (21-30) and very high (31-40) based on the impact level of disease on health status. A CAT score over 10 suggests significant symptoms.  A worsening CAT score could be explained by an exacerbation, poor medication adherence, poor inhaler technique, or progression of COPD or comorbid conditions.  CAT MCID is 2 points  mMRC: mMRC (Modified Medical Research Council) Dyspnea Scale is used to assess the degree of baseline functional disability in patients of respiratory disease due to dyspnea. No minimal important difference is established. A decrease in score of 1 point or greater is considered a positive change.   Pulmonary Function Assessment:   Exercise Target Goals: Exercise Program Goal: Individual exercise prescription set using results from initial 6 min walk test and THRR while considering  patient's activity barriers and safety.   Exercise Prescription Goal: Initial exercise prescription builds to 30-45 minutes a day of aerobic activity, 2-3 days per week.  Home exercise guidelines will be given to patient during program as part of exercise prescription that the participant will acknowledge.  Activity Barriers & Risk Stratification:  Activity Barriers & Cardiac Risk Stratification - 08/24/24 0853       Activity Barriers & Cardiac Risk Stratification   Activity Barriers Deconditioning;Assistive Device;Shortness of Breath;History of Falls;Balance  Concerns;Muscular Weakness          6 Minute Walk:  6 Minute Walk     Row Name 08/24/24 0852         6 Minute Walk   Phase Initial     Distance 710 feet  Walk Time 5.75 minutes     # of Rest Breaks 1  15 sec to tie shoe     MPH 1.4     METS 1.03     RPE 15     Perceived Dyspnea  2     VO2 Peak 3.6     Symptoms Yes (comment)     Comments SOB, fatigue     Resting HR 40 bpm     Resting BP 160/60     Resting Oxygen  Saturation  94 %     Exercise Oxygen  Saturation  during 6 min walk 86 %     Max Ex. HR 68 bpm     Max Ex. BP 170/64     2 Minute Post BP 124/66       Interval HR   1 Minute HR 54     2 Minute HR 54     3 Minute HR 56     4 Minute HR 62     5 Minute HR 58     6 Minute HR 57     2 Minute Post HR 44     Interval Heart Rate? Yes       Interval Oxygen    Interval Oxygen ? Yes     Baseline Oxygen  Saturation % 94 %     1 Minute Oxygen  Saturation % 87 %     1 Minute Liters of Oxygen  0 L  Room Air     2 Minute Oxygen  Saturation % 87 %     2 Minute Liters of Oxygen  0 L     3 Minute Oxygen  Saturation % 86 %     3 Minute Liters of Oxygen  0 L     4 Minute Oxygen  Saturation % 87 %     4 Minute Liters of Oxygen  0 L     5 Minute Oxygen  Saturation % 87 %     5 Minute Liters of Oxygen  0 L     6 Minute Oxygen  Saturation % 87 %     6 Minute Liters of Oxygen  0 L     2 Minute Post Oxygen  Saturation % 90 %     2 Minute Post Liters of Oxygen  0 L        Oxygen  Initial Assessment:  Oxygen  Initial Assessment - 08/24/24 0857       Home Oxygen    Home Oxygen  Device None    Sleep Oxygen  Prescription None    Home Exercise Oxygen  Prescription None    Home Resting Oxygen  Prescription None      Initial 6 min Walk   Oxygen  Used None      Program Oxygen  Prescription   Program Oxygen  Prescription None      Intervention   Short Term Goals To learn and exhibit compliance with exercise, home and travel O2 prescription;To learn and understand importance of monitoring  SPO2 with pulse oximeter and demonstrate accurate use of the pulse oximeter.;To learn and understand importance of maintaining oxygen  saturations>88%;To learn and demonstrate proper pursed lip breathing techniques or other breathing techniques.     Long  Term Goals Exhibits compliance with exercise, home  and travel O2 prescription;Maintenance of O2 saturations>88%;Exhibits proper breathing techniques, such as pursed lip breathing or other method taught during program session;Verbalizes importance of monitoring SPO2 with pulse oximeter and return demonstration          Oxygen  Re-Evaluation:   Oxygen  Discharge (Final Oxygen  Re-Evaluation):   Initial Exercise Prescription:  Initial Exercise  Prescription - 08/24/24 0800       Date of Initial Exercise RX and Referring Provider   Date 08/24/24    Referring Provider Rolan Barrack MD      Oxygen    Maintain Oxygen  Saturation 88% or higher      NuStep   Level 1    SPM 80    Minutes 15    METs 1.5      Track   Laps 9    Minutes 15    METs 1.5      Prescription Details   Frequency (times per week) 2    Duration Progress to 30 minutes of continuous aerobic without signs/symptoms of physical distress      Intensity   THRR 40-80% of Max Heartrate 79-118    Ratings of Perceived Exertion 11-13    Perceived Dyspnea 0-4      Progression   Progression Continue to progress workloads to maintain intensity without signs/symptoms of physical distress.      Resistance Training   Training Prescription Yes    Weight 3 lb    Reps 10-15          Perform Capillary Blood Glucose checks as needed.  Exercise Prescription Changes:   Exercise Prescription Changes     Row Name 08/24/24 0800 09/14/24 1000           Response to Exercise   Blood Pressure (Admit) 160/60 120/71      Blood Pressure (Exercise) 170/64 132/72      Blood Pressure (Exit) 124/62 122/68      Heart Rate (Admit) 40 bpm 38 bpm      Heart Rate (Exercise) 68 bpm  50 bpm      Heart Rate (Exit) 44 bpm 43 bpm      Oxygen  Saturation (Admit) 94 % 91 %      Oxygen  Saturation (Exercise) 86 % 88 %      Oxygen  Saturation (Exit) 91 % 93 %      Rating of Perceived Exertion (Exercise) 15 11      Perceived Dyspnea (Exercise) 2 1      Symptoms SOb, fatigue --      Comments walk test results --      Duration -- Continue with 30 min of aerobic exercise without signs/symptoms of physical distress.      Intensity -- THRR unchanged        Progression   Progression -- Continue to progress workloads to maintain intensity without signs/symptoms of physical distress.        Resistance Training   Training Prescription -- Yes      Weight -- 3      Reps -- 10-15        NuStep   Level -- 2      SPM -- 75      Minutes -- 30      METs -- 1.9         Exercise Comments:   Exercise Comments     Row Name 08/31/24 0905           Exercise Comments First full day of exercise!  Patient was oriented to gym and equipment including functions, settings, policies, and procedures.  Patient's individual exercise prescription and treatment plan were reviewed.  All starting workloads were established based on the results of the 6 minute walk test done at initial orientation visit.  The plan for exercise progression was also introduced and progression will be customized based on  patient's performance and goals.          Exercise Goals and Review:   Exercise Goals     Row Name 08/24/24 0855             Exercise Goals   Increase Physical Activity Yes       Intervention Provide advice, education, support and counseling about physical activity/exercise needs.;Develop an individualized exercise prescription for aerobic and resistive training based on initial evaluation findings, risk stratification, comorbidities and participant's personal goals.       Expected Outcomes Short Term: Attend rehab on a regular basis to increase amount of physical activity.;Long Term: Add in  home exercise to make exercise part of routine and to increase amount of physical activity.;Long Term: Exercising regularly at least 3-5 days a week.       Increase Strength and Stamina Yes       Intervention Provide advice, education, support and counseling about physical activity/exercise needs.;Develop an individualized exercise prescription for aerobic and resistive training based on initial evaluation findings, risk stratification, comorbidities and participant's personal goals.       Expected Outcomes Short Term: Increase workloads from initial exercise prescription for resistance, speed, and METs.;Short Term: Perform resistance training exercises routinely during rehab and add in resistance training at home;Long Term: Improve cardiorespiratory fitness, muscular endurance and strength as measured by increased METs and functional capacity ( )       Able to understand and use rate of perceived exertion (RPE) scale Yes       Intervention Provide education and explanation on how to use RPE scale       Expected Outcomes Short Term: Able to use RPE daily in rehab to express subjective intensity level;Long Term:  Able to use RPE to guide intensity level when exercising independently       Able to understand and use Dyspnea scale Yes       Intervention Provide education and explanation on how to use Dyspnea scale       Expected Outcomes Short Term: Able to use Dyspnea scale daily in rehab to express subjective sense of shortness of breath during exertion;Long Term: Able to use Dyspnea scale to guide intensity level when exercising independently       Knowledge and understanding of Target Heart Rate Range (THRR) Yes       Intervention Provide education and explanation of THRR including how the numbers were predicted and where they are located for reference       Expected Outcomes Short Term: Able to use daily as guideline for intensity in rehab;Long Term: Able to use THRR to govern intensity when  exercising independently;Short Term: Able to state/look up THRR       Able to check pulse independently Yes       Intervention Provide education and demonstration on how to check pulse in carotid and radial arteries.;Review the importance of being able to check your own pulse for safety during independent exercise       Expected Outcomes Short Term: Able to explain why pulse checking is important during independent exercise;Long Term: Able to check pulse independently and accurately       Understanding of Exercise Prescription Yes       Intervention Provide education, explanation, and written materials on patient's individual exercise prescription       Expected Outcomes Short Term: Able to explain program exercise prescription;Long Term: Able to explain home exercise prescription to exercise independently  Exercise Goals Re-Evaluation :  Exercise Goals Re-Evaluation     Row Name 08/31/24 0905             Exercise Goal Re-Evaluation   Exercise Goals Review Increase Physical Activity;Increase Strength and Stamina;Able to understand and use rate of perceived exertion (RPE) scale;Able to understand and use Dyspnea scale;Able to check pulse independently;Knowledge and understanding of Target Heart Rate Range (THRR)       Comments Reviewed RPE and dyspnea scale, THR and program prescription with pt today.  Pt voiced understanding and was given a copy of goals to take home.       Expected Outcomes Short: Use RPE daily to regulate intensity.  Long: Follow program prescription in THR.          Discharge Exercise Prescription (Final Exercise Prescription Changes):  Exercise Prescription Changes - 09/14/24 1000       Response to Exercise   Blood Pressure (Admit) 120/71    Blood Pressure (Exercise) 132/72    Blood Pressure (Exit) 122/68    Heart Rate (Admit) 38 bpm    Heart Rate (Exercise) 50 bpm    Heart Rate (Exit) 43 bpm    Oxygen  Saturation (Admit) 91 %    Oxygen  Saturation  (Exercise) 88 %    Oxygen  Saturation (Exit) 93 %    Rating of Perceived Exertion (Exercise) 11    Perceived Dyspnea (Exercise) 1    Duration Continue with 30 min of aerobic exercise without signs/symptoms of physical distress.    Intensity THRR unchanged      Progression   Progression Continue to progress workloads to maintain intensity without signs/symptoms of physical distress.      Resistance Training   Training Prescription Yes    Weight 3    Reps 10-15      NuStep   Level 2    SPM 75    Minutes 30    METs 1.9          Nutrition:  Target Goals: Understanding of nutrition guidelines, daily intake of sodium 1500mg , cholesterol 200mg , calories 30% from fat and 7% or less from saturated fats, daily to have 5 or more servings of fruits and vegetables.  Biometrics:  Pre Biometrics - 08/24/24 0855       Pre Biometrics   Height 5' 6 (1.676 m)    Weight 170 lb 11.2 oz (77.4 kg)    Waist Circumference 39.25 inches    Hip Circumference 40 inches    Waist to Hip Ratio 0.98 %    BMI (Calculated) 27.56    Grip Strength 17.4 kg    Single Leg Stand 2.9 seconds           Nutrition Therapy Plan and Nutrition Goals:  Nutrition Therapy & Goals - 08/24/24 0856       Intervention Plan   Intervention Prescribe, educate and counsel regarding individualized specific dietary modifications aiming towards targeted core components such as weight, hypertension, lipid management, diabetes, heart failure and other comorbidities.;Nutrition handout(s) given to patient.    Expected Outcomes Short Term Goal: Understand basic principles of dietary content, such as calories, fat, sodium, cholesterol and nutrients.;Long Term Goal: Adherence to prescribed nutrition plan.          Nutrition Assessments:  MEDIFICTS Score Key: >=70 Need to make dietary changes  40-70 Heart Healthy Diet <= 40 Therapeutic Level Cholesterol Diet  Flowsheet Row PULMONARY REHAB OTHER RESP ORIENTATION from  08/24/2024 in [facility] CARDIAC REHABILITATION  Picture Your  Plate Total Score on Admission 65   Picture Your Plate Scores: <59 Unhealthy dietary pattern with much room for improvement. 41-50 Dietary pattern unlikely to meet recommendations for good health and room for improvement. 51-60 More healthful dietary pattern, with some room for improvement.  >60 Healthy dietary pattern, although there may be some specific behaviors that could be improved.    Nutrition Goals Re-Evaluation:   Nutrition Goals Discharge (Final Nutrition Goals Re-Evaluation):   Psychosocial: Target Goals: Acknowledge presence or absence of significant depression and/or stress, maximize coping skills, provide positive support system. Participant is able to verbalize types and ability to use techniques and skills needed for reducing stress and depression.  Initial Review & Psychosocial Screening:  Initial Psych Review & Screening - 08/19/24 1022       Family Dynamics   Good Support System? Yes    Comments Patient's wife is his main support.      Barriers   Psychosocial barriers to participate in program There are no identifiable barriers or psychosocial needs.;The patient should benefit from training in stress management and relaxation.      Screening Interventions   Interventions Encouraged to exercise;To provide support and resources with identified psychosocial needs;Provide feedback about the scores to participant    Expected Outcomes Short Term goal: Utilizing psychosocial counselor, staff and physician to assist with identification of specific Stressors or current issues interfering with healing process. Setting desired goal for each stressor or current issue identified.;Long Term Goal: Stressors or current issues are controlled or eliminated.;Short Term goal: Identification and review with participant of any Quality of Life or Depression concerns found by scoring the questionnaire.;Long Term goal: The  participant improves quality of Life and PHQ9 Scores as seen by post scores and/or verbalization of changes          Quality of Life Scores:  Scores of 19 and below usually indicate a poorer quality of life in these areas.  A difference of  2-3 points is a clinically meaningful difference.  A difference of 2-3 points in the total score of the Quality of Life Index has been associated with significant improvement in overall quality of life, self-image, physical symptoms, and general health in studies assessing change in quality of life.   PHQ-9: Review Flowsheet       08/24/2024 04/29/2024 01/23/2024  Depression screen PHQ 2/9  Decreased Interest 0 0 0  Down, Depressed, Hopeless 0 0 0  PHQ - 2 Score 0 0 0  Altered sleeping 0 0 0  Tired, decreased energy 1 1 0  Change in appetite 0 0 0  Feeling bad or failure about yourself  0 0 0  Trouble concentrating 0 0 0  Moving slowly or fidgety/restless 0 0 0  Suicidal thoughts 0 0 0  PHQ-9 Score 1  1  0   Difficult doing work/chores Not difficult at all Not difficult at all -    Details       Data saved with a previous flowsheet row definition        Interpretation of Total Score  Total Score Depression Severity:  1-4 = Minimal depression, 5-9 = Mild depression, 10-14 = Moderate depression, 15-19 = Moderately severe depression, 20-27 = Severe depression   Psychosocial Evaluation and Intervention:   Psychosocial Re-Evaluation:   Psychosocial Discharge (Final Psychosocial Re-Evaluation):    Education: Education Goals: Education classes will be provided on a weekly basis, covering required topics. Participant will state understanding/return demonstration of topics presented.  Learning Barriers/Preferences:  Learning Barriers/Preferences - 08/19/24 1027       Learning Barriers/Preferences   Learning Barriers Sight    Learning Preferences Pictoral;Written Material          Education Topics: How Lungs Work and  Diseases: - Discuss the anatomy of the lungs and diseases that can affect the lungs, such as COPD. Flowsheet Row PULMONARY REHAB OTHER RESPIRATORY from 04/29/2024 in [facility]  Date 03/25/24  Educator hb  Instruction Review Code 1- Verbalizes Understanding    Exercise: -Discuss the importance of exercise, FITT principles of exercise, normal and abnormal responses to exercise, and how to exercise safely.   Environmental Irritants: -Discuss types of environmental irritants and how to limit exposure to environmental irritants.   Meds/Inhalers and oxygen : - Discuss respiratory medications, definition of an inhaler and oxygen , and the proper way to use an inhaler and oxygen .   Energy Saving Techniques: - Discuss methods to conserve energy and decrease shortness of breath when performing activities of daily living.  Flowsheet Row PULMONARY REHAB OTHER RESPIRATORY from 04/29/2024 in [facility]  Date 04/15/24  Educator HB  Instruction Review Code 1- Verbalizes Understanding    Bronchial Hygiene / Breathing Techniques: - Discuss breathing mechanics, pursed-lip breathing technique,  proper posture, effective ways to clear airways, and other functional breathing techniques   Cleaning Equipment: - Provides group verbal and written instruction about the health risks of elevated stress, cause of high stress, and healthy ways to reduce stress.   Nutrition I: Fats: - Discuss the types of cholesterol, what cholesterol does to the body, and how cholesterol levels can be controlled. Flowsheet Row PULMONARY REHAB OTHER RESPIRATORY from 04/29/2024 in [facility]  Date 04/29/24  Educator jh  Instruction Review Code 1- Verbalizes Understanding    Nutrition II: Labels: -Discuss the different components of food labels and how to read food labels. Flowsheet Row PULMONARY REHAB OTHER RESPIRATORY from 04/29/2024 in [facility] [place] [facility]  Date 04/29/24  Educator jh  Instruction Review Code 1- Verbalizes Understanding    Respiratory Infections: - Discuss the signs and symptoms of respiratory infections, ways to prevent respiratory infections, and the importance of seeking medical treatment when having a respiratory infection.   Stress I: Signs and Symptoms: - Discuss the causes of stress, how stress may lead to anxiety and depression, and ways to limit stress. Flowsheet Row PULMONARY REHAB OTHER RESPIRATORY from 04/29/2024 in [place] [facility]  Date 03/04/24  Educator [facility]  Instruction Review Code 1- Verbalizes Understanding    Stress II: Relaxation: -Discuss relaxation techniques to limit stress. Flowsheet Row PULMONARY REHAB OTHER RESPIRATORY from 04/29/2024 in [place]  Date 03/04/24  Educator [facility]  Instruction Review Code 1- Verbalizes Understanding    Oxygen  for Home/Travel: - Discuss how to prepare for travel when on oxygen  and proper ways to transport and store oxygen  to ensure safety.   Knowledge Questionnaire Score:  Knowledge Questionnaire Score - 08/24/24 0838       Knowledge Questionnaire Score   Pre Score 12/18          Core Components/Risk Factors/Patient Goals at Admission:  Personal Goals and Risk Factors at Admission - 08/24/24 0856       Core Components/Risk Factors/Patient Goals on Admission    Weight Management Obesity;Weight Maintenance;Yes    Intervention Weight Management: Develop a combined nutrition and exercise program designed to reach desired caloric intake, while maintaining appropriate intake of nutrient and fiber, sodium and  fats, and appropriate energy expenditure required for the weight goal.;Weight Management: Provide education and appropriate resources to help participant work on and attain dietary goals.;Weight Management/Obesity: Establish reasonable short term and long term weight goals.;Obesity: Provide education and  appropriate resources to help participant work on and attain dietary goals.    Admit Weight 170 lb 11.2 oz (77.4 kg)    Goal Weight: Short Term 165 lb (74.8 kg)    Goal Weight: Long Term 160 lb (72.6 kg)    Expected Outcomes Short Term: Continue to assess and modify interventions until short term weight is achieved;Long Term: Adherence to nutrition and physical activity/exercise program aimed toward attainment of established weight goal;Weight Maintenance: Understanding of the daily nutrition guidelines, which includes 25-35% calories from fat, 7% or less cal from saturated fats, less than 200mg  cholesterol, less than 1.5gm of sodium, & 5 or more servings of fruits and vegetables daily;Understanding of distribution of calorie intake throughout the day with the consumption of 4-5 meals/snacks;Understanding recommendations for meals to include 15-35% energy as protein, 25-35% energy from fat, 35-60% energy from carbohydrates, less than 200mg  of dietary cholesterol, 20-35 gm of total fiber daily    Improve shortness of breath with ADL's Yes    Intervention Provide education, individualized exercise plan and daily activity instruction to help decrease symptoms of SOB with activities of daily living.    Expected Outcomes Short Term: Improve cardiorespiratory fitness to achieve a reduction of symptoms when performing ADLs;Long Term: Be able to perform more ADLs without symptoms or delay the onset of symptoms    Increase knowledge of respiratory medications and ability to use respiratory devices properly  Yes    Intervention Provide education and demonstration as needed of appropriate use of medications, inhalers, and oxygen  therapy.    Expected Outcomes Short Term: Achieves understanding of medications use. Understands that oxygen  is a medication prescribed by physician. Demonstrates appropriate use of inhaler and oxygen  therapy.;Long Term: Maintain appropriate use of medications, inhalers, and oxygen  therapy.     Diabetes Yes    Intervention Provide education about proper nutrition, including hydration, and aerobic/resistive exercise prescription along with prescribed medications to achieve blood glucose in normal ranges: Fasting glucose 65-99 mg/dL;Provide education about signs/symptoms and action to take for hypo/hyperglycemia.    Expected Outcomes Short Term: Participant verbalizes understanding of the signs/symptoms and immediate care of hyper/hypoglycemia, proper foot care and importance of medication, aerobic/resistive exercise and nutrition plan for blood glucose control.;Long Term: Attainment of HbA1C < 7%.    Heart Failure Yes    Intervention Provide a combined exercise and nutrition program that is supplemented with education, support and counseling about heart failure. Directed toward relieving symptoms such as shortness of breath, decreased exercise tolerance, and extremity edema.    Expected Outcomes Improve functional capacity of life;Short term: Attendance in program 2-3 days a week with increased exercise capacity. Reported lower sodium intake. Reported increased fruit and vegetable intake. Reports medication compliance.;Short term: Daily weights obtained and reported for increase. Utilizing diuretic protocols set by physician.;Long term: Adoption of self-care skills and reduction of barriers for early signs and symptoms recognition and intervention leading to self-care maintenance.    Hypertension Yes    Intervention Monitor prescription use compliance.;Provide education on lifestyle modifcations including regular physical activity/exercise, weight management, moderate sodium restriction and increased consumption of fresh fruit, vegetables, and low fat dairy, alcohol  moderation, and smoking cessation.    Expected Outcomes Short Term: Continued assessment and intervention until BP is < 140/32mm HG in  hypertensive participants. < 130/63mm HG in hypertensive participants with diabetes, heart failure  or chronic kidney disease.;Long Term: Maintenance of blood pressure at goal levels.    Lipids Yes    Intervention Provide education and support for participant on nutrition & aerobic/resistive exercise along with prescribed medications to achieve LDL 70mg , HDL >40mg .    Expected Outcomes Short Term: Participant states understanding of desired cholesterol values and is compliant with medications prescribed. Participant is following exercise prescription and nutrition guidelines.;Long Term: Cholesterol controlled with medications as prescribed, with individualized exercise RX and with personalized nutrition plan. Value goals: LDL < 70mg , HDL > 40 mg.          Core Components/Risk Factors/Patient Goals Review:    Core Components/Risk Factors/Patient Goals at Discharge (Final Review):    ITP Comments:  ITP Comments     Row Name 08/24/24 0849 08/26/24 0810 08/31/24 0905 09/22/24 0929     ITP Comments Patient arrived for 1st visit/orientation/education at 0800. Patient was referred to PR by Dr. Ezra Hoffman due to Pulmonary HTN. During orientation advised patient on arrival and appointment times what to wear, what to do before, during and after exercise. Reviewed attendance and class policy.  Pt is scheduled to return Pulmonary Rehab on 08/31/24 at 915. Pt was advised to come to class 15 minutes before class starts.  Discussed RPE/Dpysnea scales. Patient participated in warm up stretches. Patient was able to complete 6 minute walk test. Patient was measured for the equipment. Discussed equipment safety with patient. Took patient pre-anthropometric measurements. Patient finished visit at 845. 30 day review completed. ITP sent to Dr.Jehanzeb Memon, Medical Director of  Pulmonary Rehab. Continue with ITP unless changes are made by physician. Pt new to the program, has not started yet. First full day of exercise!  Patient was oriented to gym and equipment including functions, settings, policies, and  procedures.  Patient's individual exercise prescription and treatment plan were reviewed.  All starting workloads were established based on the results of the 6 minute walk test done at initial orientation visit.  The plan for exercise progression was also introduced and progression will be customized based on patient's performance and goals. 30 day review completed. ITP sent to Dr.Jehanzeb Memon, Medical Director of  Pulmonary Rehab. Continue with ITP unless changes are made by physician.       Comments: 30 day review

## 2024-09-23 ENCOUNTER — Encounter (HOSPITAL_COMMUNITY)

## 2024-09-23 DIAGNOSIS — I5032 Chronic diastolic (congestive) heart failure: Secondary | ICD-10-CM | POA: Diagnosis not present

## 2024-09-23 DIAGNOSIS — K7460 Unspecified cirrhosis of liver: Secondary | ICD-10-CM | POA: Diagnosis not present

## 2024-09-23 DIAGNOSIS — R52 Pain, unspecified: Secondary | ICD-10-CM | POA: Diagnosis not present

## 2024-09-23 DIAGNOSIS — Z299 Encounter for prophylactic measures, unspecified: Secondary | ICD-10-CM | POA: Diagnosis not present

## 2024-09-23 DIAGNOSIS — I10 Essential (primary) hypertension: Secondary | ICD-10-CM | POA: Diagnosis not present

## 2024-09-23 DIAGNOSIS — N184 Chronic kidney disease, stage 4 (severe): Secondary | ICD-10-CM | POA: Diagnosis not present

## 2024-09-23 DIAGNOSIS — I4891 Unspecified atrial fibrillation: Secondary | ICD-10-CM | POA: Diagnosis not present

## 2024-09-23 DIAGNOSIS — E119 Type 2 diabetes mellitus without complications: Secondary | ICD-10-CM | POA: Diagnosis not present

## 2024-09-28 ENCOUNTER — Other Ambulatory Visit: Payer: Self-pay | Admitting: Internal Medicine

## 2024-09-28 ENCOUNTER — Ambulatory Visit (HOSPITAL_COMMUNITY)
Admission: RE | Admit: 2024-09-28 | Discharge: 2024-09-28 | Disposition: A | Source: Ambulatory Visit | Attending: Internal Medicine | Admitting: Internal Medicine

## 2024-09-28 ENCOUNTER — Encounter (HOSPITAL_COMMUNITY)

## 2024-09-28 DIAGNOSIS — M7989 Other specified soft tissue disorders: Secondary | ICD-10-CM | POA: Diagnosis not present

## 2024-09-28 DIAGNOSIS — R600 Localized edema: Secondary | ICD-10-CM | POA: Diagnosis not present

## 2024-09-30 ENCOUNTER — Encounter (HOSPITAL_COMMUNITY)

## 2024-10-01 ENCOUNTER — Telehealth (HOSPITAL_COMMUNITY): Payer: Self-pay | Admitting: *Deleted

## 2024-10-01 ENCOUNTER — Telehealth (HOSPITAL_COMMUNITY): Payer: Self-pay

## 2024-10-01 ENCOUNTER — Ambulatory Visit (HOSPITAL_COMMUNITY)
Admission: RE | Admit: 2024-10-01 | Discharge: 2024-10-01 | Disposition: A | Discharge status: Home / Self Care | Source: Ambulatory Visit | Attending: Physician Assistant | Admitting: Physician Assistant

## 2024-10-01 ENCOUNTER — Encounter (HOSPITAL_COMMUNITY): Payer: Self-pay

## 2024-10-01 ENCOUNTER — Ambulatory Visit (HOSPITAL_COMMUNITY): Payer: Self-pay | Admitting: Physician Assistant

## 2024-10-01 VITALS — BP 180/68 | HR 58 | Ht 66.0 in | Wt 183.4 lb

## 2024-10-01 DIAGNOSIS — I5032 Chronic diastolic (congestive) heart failure: Secondary | ICD-10-CM

## 2024-10-01 DIAGNOSIS — I50810 Right heart failure, unspecified: Secondary | ICD-10-CM | POA: Diagnosis not present

## 2024-10-01 DIAGNOSIS — I4821 Permanent atrial fibrillation: Secondary | ICD-10-CM | POA: Diagnosis not present

## 2024-10-01 DIAGNOSIS — I2721 Secondary pulmonary arterial hypertension: Secondary | ICD-10-CM | POA: Diagnosis not present

## 2024-10-01 DIAGNOSIS — I10 Essential (primary) hypertension: Secondary | ICD-10-CM

## 2024-10-01 LAB — BASIC METABOLIC PANEL WITH GFR
Anion gap: 9 (ref 5–15)
BUN: 63 mg/dL — ABNORMAL HIGH (ref 8–23)
CO2: 33 mmol/L — ABNORMAL HIGH (ref 22–32)
Calcium: 8.4 mg/dL — ABNORMAL LOW (ref 8.9–10.3)
Chloride: 98 mmol/L (ref 98–111)
Creatinine, Ser: 2.6 mg/dL — ABNORMAL HIGH (ref 0.61–1.24)
GFR, Estimated: 24 mL/min — ABNORMAL LOW (ref >60)
Glucose, Bld: 155 mg/dL — ABNORMAL HIGH (ref 70–99)
Potassium: 4.4 mmol/L (ref 3.5–5.1)
Sodium: 140 mmol/L (ref 135–145)

## 2024-10-01 MED ORDER — HYDRALAZINE HCL 25 MG PO TABS: 50.0000 mg | ORAL_TABLET | Freq: Three times a day (TID) | ORAL | 3 refills | Status: AC

## 2024-10-01 NOTE — Telephone Encounter (Signed)
 Called to confirm/remind patient of their appointment at [facility] on 10/01/24 10:00.   Appointment:   [] Confirmed  [x] Left mess   [] No answer/No voice mail  [] VM Full/unable to leave message  [] Phone not in service  Patient reminded to bring all medications and/or complete list.  Confirmed patient has transportation. Gave directions, instructed to utilize valet parking.

## 2024-10-01 NOTE — Addendum Note (Signed)
 Encounter addended by: Colletta Manuelita Garre, PA-C on: 10/01/2024 12:44 PM  Actions taken: Vitals modified

## 2024-10-01 NOTE — Telephone Encounter (Signed)
 Called patient's wife per Morna Dutch, [place] with following:  Kidney function looks worse on labs, this could be due to fluid but he is also taking clindamycin. I can't see that BNP was done. Since we increased fluid pills today, he should get BMET and BNP in 1 week. He can get done at [facility] near home if needed.  Wife verbalized understanding of same. She reports patient is not going to [facility] for pulmonary rehab due to leg injury. He does have appt with his PCP (Dr. Loreli) on 10/09/24 and will get labs at the local [facility] that day. Orders placed.

## 2024-10-01 NOTE — Telephone Encounter (Signed)
 Called patient's wife per Morna Dutch, [place] with following:  Kidney function looks worse on labs, this could be due to fluid but he is also taking clindamycin. I can't see that BNP was done. Since we increased fluid pills today, he should get BMET and BNP in 1 week. He can get done at [facility] near home if needed.  Wife verbalized understanding of same. She reports patient is not going to [facility] for pulmonary rehab due to leg injury. He does have appt with his PCP (Dr. Loreli) on 10/09/24 and will get labs at the local [facility] that day. Orders placed.   Copy of lab orders mailed to patient.

## 2024-10-01 NOTE — Patient Instructions (Signed)
 CHANGE Hydralazine  to 50 mg Three times a day  CHANGE Torsemide  to 60 mg Twice daily for 4 days then go back to 60 mg daily.  Labs done today, your results will be available in MyChart, we will contact you for abnormal readings.  Your physician recommends that you schedule a follow-up appointment in: 3 weeks.  If you have any questions or concerns before your next appointment please send us  a message through [facility] or call our office at [phone].    TO LEAVE A MESSAGE FOR THE NURSE SELECT OPTION 2, PLEASE LEAVE A MESSAGE INCLUDING: YOUR NAME DATE OF BIRTH CALL BACK NUMBER REASON FOR CALL**this is important as we prioritize the call backs  YOU WILL RECEIVE A CALL BACK THE SAME DAY AS LONG AS YOU CALL BEFORE 4:00 PM  At the [facility], you and your health needs are our priority. As part of our continuing mission to provide you with exceptional heart care, we have created designated Provider Care Teams. These Care Teams include your primary Cardiologist (physician) and Advanced Practice Providers (APPs- Physician Assistants and Nurse Practitioners) who all work together to provide you with the care you need, when you need it.   You may see any of the following providers on your designated Care Team at your next follow up: Dr Toribio Fuel Dr Ezra Shuck Dr. Morene Brownie Greig Mosses, NP Caffie Shed, [place] [facility] [place], [place] Beckey Coe, NP Jordan Lee, NP Ellouise Class, NP Tinnie Redman, PharmD Jaun Bash, PharmD   Please be sure to bring in all your medications bottles to every appointment.    Thank you for choosing [facility]

## 2024-10-01 NOTE — Progress Notes (Addendum)
 [facility]  PCP: Maree Isles, MD Primary Cardiologist: Dr. Debera Nephrologist: Dr. Macel HF Cardiologist: Dr. Rolan  HPI: Robert Baldwin is a 83 y.o. male with history of carotid artery stenosis, DM II, HTN, HLD, permanent atrial fibrillation. He follows with Dr. Debera in the cardiology clinic.    Saw [facility] for carotid artery stenosis in 6/23. Was noting more dyspnea with exertion and lower extremity edema for about a month. He was subsequently started on po lasix  20 mg daily.   He was seen by Dr. Debera in the clinic 04/26/22 for evaluation of recent symptoms and pre-operative evaluation in preparation for left CEA followed by staged right CEA. Echo day of the visit showed EF 70-75%, hyperdynamic LV function, mild LVH, interventricular septum flattened in systole and diastole consistent with RV pressure and volume overload, RV moderately enlarged and moderately reduced, RVSP 120 mmHg, moderate LAE, severe RAE, moderate pericardial effusion, severe TR, dilated IVC. Patient was scheduled for outpatient R/LHC to better assess hemodynamics and further characterize pulmonary hypertension.    [facility] 7/23 showed no significant CAD, RA mean 25, PA 115/30 (58), LVEDP 22 mmHg, PCWP not obtained, Fick CO/CI 6.4/3.1, PAPi 3.4. PVR 5.6 WU. 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 He was diuresed with IV lasix . V/Q scan negative for chronic PE. ANA+. All other serologic test -. HRCT with no definite ILD. Started on tadalafil . Diuresed 24 lbs. Hospitalization complicated by right groin hematoma and received 1 unit PRBCs. Discharged home, weight 179 lbs.  Repeat RHC 8/23 showed elevated PCWP and severe mixed pulmonary arterial/venous hypertension, but PA pressures significantly lower than prior RHC. He was instructed to increase torsemide  to 40 mg daily alternating with 20 mg every other day. Spiro and Entresto  held with  hyperkalemia.  Patient had awake left TCAR by Dr. Gretta in 9/23, no complications.  S/p right TCAR 10/23. Tolerated procedure well.   Echo 7/24 showed EF 60-65%, D-shaped interventricular septum suggestive of RV pressure/volume overload, moderate RV enlargement with mildly decreased systolic function, PASP 78 mmHg, mild mitral stenosis mean gradient 4, mild aortic stenosis mean gradient 15.   Follow up 7/24, creatinine elevated 2.35>>3.32. 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 Torsemide  was increased to 40 mg bid.  Patient was admitted in 1/25 with multifocal PNA and volume overload at [facility].  He was diuresed and was treated with antibiotics.  He was sent home on oxygen  but is no longer using it. Echo in 1/25 showed EF 65-70%, moderate RV enlargement with moderate RV dysfunction, severe biatrial enlargement, mild mitral stenosis with mean gradient 4.7 mmHg, moderate TR, mild aortic stenosis with mean gradient 11 mmHg, PASP 76 mmHg.   Here today for heart failure follow-up.  He bumped his left lower leg on the running bar of a vehicle about 2 weeks ago. Subsequently developed pain, swelling and erythema to anterior shin. He saw his PCP and was started on course of clindamycin this past week. Got venous duplex on 12/08 which was negative for DVT, extensive subcutaneous edema noted.  His daughter reports he has seemed more short of breath with activity the last few days. No orthopnea or PND. Home weight is up a few lbs. Admits to some indiscretions with sodium intake.  Taking all medications, his wife has been giving him an extra 20 mg torsemide  the last few days.   Labs (7/24): LDL 47, hgb 10.4, K 5.3, creatinine 3.32, BUN 102 Labs (9/24): K 4.0,  creatinine 2.15 Labs (2/25): K 3.9, creatinine 1.75, LDL 50 Labs (3/25): K 4.5, creatinine 2.29 Labs (6/25): K 4.5, creatinine 2.15 Labs (10/25): K 5.1, creatinine 1.99  6 minute walk (7/24): 259 m 6 minute walk (9/24): 195 m 6 minutes walk (5/25): 243 m 6 minute walk today 08/12/24: 274 m  PMH: 1. HTN 2. Hyperlipidemia 3. Type 2 diabetes 4. CKD stage 3 5. Atrial fibrillation: Permanent.  6. Carotid stenosis: CTA neck (6/23) with high grade stenosis of the bilateral internal carotid arteries.  - S/p left TCAR 9/23.  - S/p right TCAR 10/23. - Carotid doppler (9/24): LICA stent 50-75% stenosis, RICA stent 50-75% stenosis.  7. 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  - R/LHC (7/23): no significant CAD; RA mean 25, PA 115/30 (58), LVEDP 22 mmHg, PCWP not obtained, Fick CO/CI 6.4/3.1, PAPi 3.4. PVR 5.6 WU. - RHC (8/23): Mean RA 8, PA 85/40 mean 41, mean PCWP 22, CI 2.86, PVR 3.4 WU - Echo (7/24): EF 60-65%, D-shaped interventricular septum suggestive of RV pressure/volume overload, moderate RV enlargement with mildly decreased systolic function, PASP 78 mmHg, mild mitral stenosis mean gradient 4, mild aortic stenosis mean gradient 15.  - RHC (7/24): RA mean 9, PA 78/16 (44), PCWP 17 (prominent v-waves to 31), CO/CI (Fick) 7.63/4.09, PVR 3.54 WU - Echo (1/25): EF 65-70%, moderate RV enlargement with moderate RV dysfunction, severe biatrial enlargement, mild mitral stenosis with mean gradient 4.7 mmHg, moderate TR, mild aortic stenosis with mean gradient 11 mmHg, PASP 76 mmHg.  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  See RHC and echo data above.   - V/Q scan  (7/23): No evidence for chronic PE.  - High resolution CT chest (7/23): No definite ILD.  - + ANA -  RHC (7/24): RA mean 9, PA 78/16 (44), PCWP 17 (prominent v-waves to 31), CO/CI (Fick) 7.63/4.09, PVR 3.54 WU  Current Outpatient Medications  Medication Sig Dispense Refill   clindamycin (CLEOCIN) 300 MG capsule Take 300 mg by mouth 3 (three) times daily.     aspirin  EC 81 MG tablet Take 1 tablet (81 mg total) by mouth daily. Swallow whole.     atorvastatin  (LIPITOR) 40 MG tablet Take 40 mg by mouth at bedtime.     cholecalciferol  (VITAMIN D ) 25 MCG (1000 UNIT) tablet Take 1,000 Units by mouth daily.     FARXIGA  10 MG TABS tablet TAKE 1 TABLET BY MOUTH DAILY 30 tablet 4   fish oil-omega-3 fatty acids  1000 MG capsule Take 1 g by mouth every evening.     hydrALAZINE  (APRESOLINE ) 25 MG tablet Take 2 tablets (50 mg total) by mouth 3 (three) times daily. 200 tablet 3   LOKELMA  10 g PACK packet TAKE 10 GRAM BY MOUTH AS DIRECTED (Patient not taking: Reported on 08/19/2024) 11 packet 2   macitentan  (OPSUMIT ) 10 MG tablet TAKE 1 TABLET BY MOUTH DAILY 90 tablet 3   Misc Natural Products (GLUCOS-CHONDROIT-MSM COMPLEX) TABS Take 2 tablets by mouth daily.  Multiple Vitamins-Minerals (MULTIVITAMIN WITH MINERALS) tablet Take 1 tablet by mouth in the morning.  Centrum Silver     niacin  500 MG tablet Take 500 mg by mouth in the morning.     potassium chloride  (KLOR-CON  M) 10 MEQ tablet TAKE 1 TABLET BY MOUTH DAILY 90 tablet 3   tadalafil  (CIALIS ) 20 MG tablet TAKE 2 TABLETS BY MOUTH DAILY 180 tablet 3   torsemide  (DEMADEX ) 20 MG tablet Take 3 tablets (60 mg total) by mouth daily.     warfarin (COUMADIN ) 5 MG tablet Take 5-7.5 mg by mouth See admin instructions. Take 5 mg on Saturday,Sunday Tues, Wed, Thursday, and then  Take 7.5 mg on Monday,  Wednesday, and Friday     No current facility-administered medications for this encounter.   Allergies  Allergen Reactions   Other Rash    Bandaids   Social  History   Socioeconomic History   Marital status: Married    Spouse name: Tammy   Number of children: Not on file   Years of education: Not on file   Highest education level: Not on file  Occupational History   Occupation: Full time    Comment: Contractor  Tobacco Use   Smoking status: Never    Passive exposure: Never   Smokeless tobacco: Never   Tobacco comments:    Never smoke 05/18/22  Vaping Use   Vaping status: Never Used  Substance and Sexual Activity   Alcohol  use: No    Alcohol /week: 0.0 standard drinks of alcohol    Drug use: No   Sexual activity: Not Currently  Other Topics Concern   Not on file  Social History Narrative   Not on file   Social Drivers of Health   Tobacco Use: Low Risk (10/01/2024)   Patient History    Smoking Tobacco Use: Never    Smokeless Tobacco Use: Never    Passive Exposure: Never  Financial Resource Strain: Not on file  Food Insecurity: No Food Insecurity (11/13/2023)   Hunger Vital Sign    Worried About Running Out of Food in the Last Year: Never true    Ran Out of Food in the Last Year: Never true  Transportation Needs: No Transportation Needs (11/13/2023)   PRAPARE - Administrator, Civil Service (Medical): No    Lack of Transportation (Non-Medical): No  Physical Activity: Not on file  Stress: Not on file  Social Connections: Socially Integrated (11/13/2023)   Social Connection and Isolation Panel    Frequency of Communication with Friends and Family: Three times a week    Frequency of Social Gatherings with Friends and Family: Once a week    Attends Religious Services: 1 to 4 times per year    Active Member of [employer] or Organizations: Yes    Attends Banker Meetings: 1 to 4 times per year    Marital Status: Married  Catering Manager Violence: Not At Risk (11/13/2023)   Humiliation, Afraid, Rape, and Kick questionnaire    Fear of Current or Ex-Partner: No    Emotionally Abused: No    Physically Abused: No     Sexually Abused: No  Depression (PHQ2-9): Low Risk (08/24/2024)   Depression (PHQ2-9)    PHQ-2 Score: 1  Alcohol  Screen: Not on file  Housing: Low Risk (11/13/2023)   Housing Stability Vital Sign    Unable to Pay for Housing in the Last Year: No    Number of Times Moved in the Last Year: 0  Homeless in the Last Year: No  Utilities: Not At Risk (11/13/2023)   AHC Utilities    Threatened with loss of utilities: No  Health Literacy: Not on file   Family History  Problem Relation Age of Onset   Stroke Other    Heart disease Other    BP (!) 196/54   Pulse (!) 58   Ht 5' 6 (1.676 m)   Wt 83.2 kg (183 lb 6.4 oz)   SpO2 93%   BMI 29.60 kg/m   Wt Readings from Last 3 Encounters:  10/01/24 83.2 kg (183 lb 6.4 oz)  08/24/24 77.4 kg (170 lb 11.2 oz)  08/12/24 78.3 kg (172 lb 9.6 oz)   PHYSICAL EXAM: General:  Elderly male. Ambulated into clinic. Neck: JVP 10-12 Cor: Irregular rhythm. No murmurs. Lungs: + crackles Abdomen: soft, nontender, nondistended. Extremities: trace edema RLE, LLE with venous stasis changes, erythema with knot on anterior shin from recent injury Neuro: alert & orientedx3. Affect pleasant   ASSESSMENT & PLAN: 1. 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 RHC/LHC 7/23 showed preserved cardiac output with severe pulmonary hypertension. 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  PA pressure was still high, but is significantly lower than prior RHC.  RHC (7/24) showed mildly elevated PCWP with prominent v-waves suggestive of mitral valve disease, severe mixed pulmonary  arterial/pulmonary venous hypertension with PVR of 3.54 WU, mildly elevated RA pressure and preserved CO. Echo in 1/25 showed EF 65-70%, moderate RV enlargement with moderate RV dysfunction, severe biatrial enlargement, mild mitral stenosis with mean gradient 4.7 mmHg, moderate TR, mild aortic stenosis with mean gradient 11 mmHg, PASP 76 mmHg. NYHA III, functional class confounded by deconditioning. Volume up on exam today.  Suspect LLE edema primarily d/t injury with possible cellulitis. However, does have JVD and crackles in lungs. Wear compression stockings regularly.  - Take 60 mg Torsemide  BID X 4 days then reduce back to 60 mg daily - Continue Farxiga  10 mg daily.  - Off olmesartan with worsening SCr. - BMET/BNP today - May need to consider repeat echo if not improving at follow-up 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  Some improvement on 8/23 RHC still with severe mixed PAH/PVH with PVR down to 3.4 WU.  He has never smoked and has no known lung disease.  He has been on warfarin for atrial fibrillation long-term. No known rheumatologic illness.  Not a drinker though he has some signs of cirrhosis by prior liver imaging.  V/Q scan in 7/23 negative for chronic PEs and HRCT chest without definite ILD. Serologic workup showed anti-SCL 70 neg, anti-centromere Ab neg, RF neg, ANA with reflex positive. Suspicion for component of group 1 PH (mixed group 1 and 2).  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   - Diuretics as above. - Continue Opsumit  10 mg daily. - Continue tadalafil  40 mg daily. - Has OSA but unable to tolerate CPAP.   - Participating in rehab at [facility].  3. Atrial fibrillation: Permanent.   - Continue  warfarin.   - Bradycardic, does not need nodal blocker. Asymptomatic 4. DM2:  Continue SGLT2i 5. Carotid stenosis: CTA neck in 6/23 showed high grade bilateral ICA stenoses. He has had awake L TCAR 9/23 and R TCAR 10/23.  - He is on warfarin long-term for AF.    - Continue statin, goal LDL < 55.  Good lipids in 2/25.  6. HTN: BP markedly elevated today. Elevated at PCP visit earlier this week as well. Much higher than his norm. ? If pain contributing. - Increase hydralazine  to 50 mg TID - Monitor BP at home - Off olmesartan in setting of CKD 7. CKD stage IIIb: Last SCr 1.99 - Continue SGLT2i.  - Labs today 8. Aortic stenosis: Mild on 1/25 echo.  9. Mitral stenosis: Mild on 1/25 echo.   Follow up 2 weeks with APP to assess volume   Kynzley Dowson N PA-C 10/01/2024

## 2024-10-05 ENCOUNTER — Encounter (HOSPITAL_COMMUNITY)

## 2024-10-07 ENCOUNTER — Encounter (HOSPITAL_COMMUNITY)

## 2024-10-12 ENCOUNTER — Encounter (HOSPITAL_COMMUNITY)

## 2024-10-14 ENCOUNTER — Encounter (HOSPITAL_COMMUNITY)

## 2024-10-19 ENCOUNTER — Encounter (HOSPITAL_COMMUNITY)

## 2024-10-19 ENCOUNTER — Ambulatory Visit (HOSPITAL_COMMUNITY): Payer: Self-pay | Admitting: Family Medicine

## 2024-10-19 ENCOUNTER — Other Ambulatory Visit: Payer: Self-pay

## 2024-10-19 ENCOUNTER — Ambulatory Visit (HOSPITAL_COMMUNITY)
Admission: RE | Admit: 2024-10-19 | Discharge: 2024-10-19 | Disposition: A | Discharge status: Home / Self Care | Source: Ambulatory Visit | Attending: Family Medicine | Admitting: Family Medicine

## 2024-10-19 ENCOUNTER — Other Ambulatory Visit (HOSPITAL_COMMUNITY): Payer: Self-pay

## 2024-10-19 ENCOUNTER — Encounter (HOSPITAL_COMMUNITY): Payer: Self-pay

## 2024-10-19 ENCOUNTER — Encounter: Payer: Self-pay | Admitting: Internal Medicine

## 2024-10-19 ENCOUNTER — Telehealth (HOSPITAL_COMMUNITY): Payer: Self-pay

## 2024-10-19 VITALS — BP 170/50 | HR 51 | Wt 192.2 lb

## 2024-10-19 DIAGNOSIS — I4821 Permanent atrial fibrillation: Secondary | ICD-10-CM | POA: Diagnosis not present

## 2024-10-19 DIAGNOSIS — I10 Essential (primary) hypertension: Secondary | ICD-10-CM | POA: Diagnosis not present

## 2024-10-19 DIAGNOSIS — I272 Pulmonary hypertension, unspecified: Secondary | ICD-10-CM | POA: Diagnosis not present

## 2024-10-19 DIAGNOSIS — I350 Nonrheumatic aortic (valve) stenosis: Secondary | ICD-10-CM | POA: Diagnosis not present

## 2024-10-19 DIAGNOSIS — I080 Rheumatic disorders of both mitral and aortic valves: Secondary | ICD-10-CM | POA: Insufficient documentation

## 2024-10-19 DIAGNOSIS — Z79899 Other long term (current) drug therapy: Secondary | ICD-10-CM | POA: Insufficient documentation

## 2024-10-19 DIAGNOSIS — Z7985 Long-term (current) use of injectable non-insulin antidiabetic drugs: Secondary | ICD-10-CM | POA: Diagnosis not present

## 2024-10-19 DIAGNOSIS — G4733 Obstructive sleep apnea (adult) (pediatric): Secondary | ICD-10-CM | POA: Diagnosis not present

## 2024-10-19 DIAGNOSIS — I6523 Occlusion and stenosis of bilateral carotid arteries: Secondary | ICD-10-CM | POA: Diagnosis not present

## 2024-10-19 DIAGNOSIS — I050 Rheumatic mitral stenosis: Secondary | ICD-10-CM | POA: Diagnosis not present

## 2024-10-19 DIAGNOSIS — Z7901 Long term (current) use of anticoagulants: Secondary | ICD-10-CM | POA: Insufficient documentation

## 2024-10-19 DIAGNOSIS — I5033 Acute on chronic diastolic (congestive) heart failure: Secondary | ICD-10-CM | POA: Insufficient documentation

## 2024-10-19 DIAGNOSIS — I2721 Secondary pulmonary arterial hypertension: Secondary | ICD-10-CM | POA: Insufficient documentation

## 2024-10-19 DIAGNOSIS — E785 Hyperlipidemia, unspecified: Secondary | ICD-10-CM | POA: Diagnosis not present

## 2024-10-19 DIAGNOSIS — E1122 Type 2 diabetes mellitus with diabetic chronic kidney disease: Secondary | ICD-10-CM | POA: Insufficient documentation

## 2024-10-19 DIAGNOSIS — E8770 Fluid overload, unspecified: Secondary | ICD-10-CM | POA: Insufficient documentation

## 2024-10-19 DIAGNOSIS — I50813 Acute on chronic right heart failure: Secondary | ICD-10-CM | POA: Diagnosis not present

## 2024-10-19 DIAGNOSIS — I130 Hypertensive heart and chronic kidney disease with heart failure and stage 1 through stage 4 chronic kidney disease, or unspecified chronic kidney disease: Secondary | ICD-10-CM | POA: Diagnosis not present

## 2024-10-19 DIAGNOSIS — Z7984 Long term (current) use of oral hypoglycemic drugs: Secondary | ICD-10-CM | POA: Insufficient documentation

## 2024-10-19 DIAGNOSIS — N1832 Chronic kidney disease, stage 3b: Secondary | ICD-10-CM | POA: Diagnosis not present

## 2024-10-19 DIAGNOSIS — E1159 Type 2 diabetes mellitus with other circulatory complications: Secondary | ICD-10-CM

## 2024-10-19 LAB — BASIC METABOLIC PANEL WITH GFR
Anion gap: 12 (ref 5–15)
BUN: 83 mg/dL — ABNORMAL HIGH (ref 8–23)
CO2: 30 mmol/L (ref 22–32)
Calcium: 8.7 mg/dL — ABNORMAL LOW (ref 8.9–10.3)
Chloride: 99 mmol/L (ref 98–111)
Creatinine, Ser: 2.32 mg/dL — ABNORMAL HIGH (ref 0.61–1.24)
GFR, Estimated: 27 mL/min — ABNORMAL LOW
Glucose, Bld: 135 mg/dL — ABNORMAL HIGH (ref 70–99)
Potassium: 3.9 mmol/L (ref 3.5–5.1)
Sodium: 141 mmol/L (ref 135–145)

## 2024-10-19 LAB — PRO BRAIN NATRIURETIC PEPTIDE: Pro Brain Natriuretic Peptide: 3897 pg/mL — ABNORMAL HIGH

## 2024-10-19 MED ORDER — FUROSCIX 80 MG/10ML ~~LOC~~ CTKT
80.0000 mg | CARTRIDGE | SUBCUTANEOUS | 1 refills | Status: DC
  Filled 2024-10-19: qty 10, 10d supply, fill #0

## 2024-10-19 NOTE — Patient Instructions (Signed)
 Your provider has order Furoscix  for you. This is an on-body infuser that gives you a dose of Furosemide .   It will be shipped to your home from [facility], they will call you before shipping  Ensure you write down the time you start your infusion so that if there is a problem you will know how long the infusion lasted  Use Furoscix  only AS DIRECTED by our office  Dosing Directions:   Day 1= 1 kit with 4 potassium tablets 12/29  Day 2= 1 kit with 4 potassium tablets 12/30  Day 3= 1 kit with 4 potassium tablets  DO NOT TAKE TORSEMIDE  WHILE USING THE FUROSCIX .  ON THURSDAY RESTART TORSEMIDE  AT 60 MG DAILY AND 1 POTASSIUM TABLET.  Labs done today, your results will be available in MyChart, we will contact you for abnormal readings.  Your physician recommends that you schedule a follow-up appointment in: as scheduled.  If you have any questions or concerns before your next appointment please send us  a message through [place] or call our office at [phone].    TO LEAVE A MESSAGE FOR THE NURSE SELECT OPTION 2, PLEASE LEAVE A MESSAGE INCLUDING: YOUR NAME DATE OF BIRTH CALL BACK NUMBER REASON FOR CALL**this is important as we prioritize the call backs  YOU WILL RECEIVE A CALL BACK THE SAME DAY AS LONG AS YOU CALL BEFORE 4:00 PM  At the [facility], you and your health needs are our priority. As part of our continuing mission to provide you with exceptional heart care, we have created designated Provider Care Teams. These Care Teams include your primary Cardiologist (physician) and Advanced Practice Providers (APPs- Physician Assistants and Nurse Practitioners) who all work together to provide you with the care you need, when you need it.   You may see any of the following providers on your designated Care Team at your next follow up: Dr Toribio Fuel Dr Ezra Shuck Dr. Morene Brownie Greig Mosses, NP Caffie Shed, [place] [facility] [place], [place] Beckey Coe, NP Jordan Lee, NP Ellouise Class, NP Tinnie Redman, PharmD Jaun Bash, PharmD   Please be sure to bring in all your medications bottles to every appointment.    Thank you for choosing [facility]

## 2024-10-19 NOTE — Addendum Note (Signed)
 Encounter addended by: Marcelina Lisa HERO, RN on: 10/19/2024 10:15 AM  Actions taken: Order list changed

## 2024-10-19 NOTE — Telephone Encounter (Signed)
 Advanced Heart Failure Patient Advocate Encounter  Prior authorization for Furoscix  has been submitted and approved. Test billing returns $0 for up to 90 day supply.  Key: [id number] Effective: 10/23/2023 to 10/21/2025  Rachel DEL, CPhT Rx Patient Advocate Phone: [phone]

## 2024-10-19 NOTE — Progress Notes (Signed)
 Medication Samples have been provided to the patient.  Drug name: Furoscix        Strength: 80mg /28ml        Qty: 2   LOT: [id number]  Exp.Date: 02-18-26  Dosing instructions: tale as directed by the advanced heart failure clinic   The patient has been instructed regarding the correct time, dose, and frequency of taking this medication, including desired effects and most common side effects.   Robert Baldwin [name] 11:26 AM 10/19/2024

## 2024-10-19 NOTE — Progress Notes (Signed)
 Specialty Pharmacy Initial Fill Coordination Note  Robert Baldwin is a 83 y.o. male contacted today regarding initial fill of specialty medication(s) Furosemide  (Furoscix )   Patient requested [facility] at [facility] [place] date: 10/19/24   Medication will be filled on: 10/19/24    Patient is aware of $0 copayment.

## 2024-10-19 NOTE — Addendum Note (Signed)
 Encounter addended by: Marcelina Lisa HERO, RN on: 10/19/2024 11:27 AM  Actions taken: Clinical Note Signed

## 2024-10-19 NOTE — Progress Notes (Signed)
 "  [facility]  PCP: Maree Isles, MD Primary Cardiologist: Dr. Debera Nephrologist: Dr. Macel HF Cardiologist: Dr. Rolan  HPI: Robert Baldwin is a 83 y.o. male with history of carotid artery stenosis, DM II, HTN, HLD, permanent atrial fibrillation. He follows with Dr. Debera in the cardiology clinic.    Saw [facility] for carotid artery stenosis in 6/23. Was noting more dyspnea with exertion and lower extremity edema for about a month. He was subsequently started on po lasix  20 mg daily.   He was seen by Dr. Debera in the clinic 04/26/22 for evaluation of recent symptoms and pre-operative evaluation in preparation for left CEA followed by staged right CEA. Echo day of the visit showed EF 70-75%, hyperdynamic LV function, mild LVH, interventricular septum flattened in systole and diastole consistent with RV pressure and volume overload, RV moderately enlarged and moderately reduced, RVSP 120 mmHg, moderate LAE, severe RAE, moderate pericardial effusion, severe TR, dilated IVC. Patient was scheduled for outpatient R/LHC to better assess hemodynamics and further characterize pulmonary hypertension.    [facility] 7/23 showed no significant CAD, RA mean 25, PA 115/30 (58), LVEDP 22 mmHg, PCWP not obtained, Fick CO/CI 6.4/3.1, PAPi 3.4. PVR 5.6 WU. 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 He was diuresed with IV lasix . V/Q scan negative for chronic PE. ANA+. All other serologic test -. HRCT with no definite ILD. Started on tadalafil . Diuresed 24 lbs. Hospitalization complicated by right groin hematoma and received 1 unit PRBCs. Discharged home, weight 179 lbs.  Repeat RHC 8/23 showed elevated PCWP and severe mixed pulmonary arterial/venous hypertension, but PA pressures significantly lower than prior RHC. He was instructed to increase torsemide  to 40 mg daily alternating with 20 mg every other day. Spiro and Entresto  held with  hyperkalemia.  Patient had awake left TCAR by Dr. Gretta in 9/23, no complications.  S/p right TCAR 10/23. Tolerated procedure well.   Echo 7/24 showed EF 60-65%, D-shaped interventricular septum suggestive of RV pressure/volume overload, moderate RV enlargement with mildly decreased systolic function, PASP 78 mmHg, mild mitral stenosis mean gradient 4, mild aortic stenosis mean gradient 15.   Follow up 7/24, creatinine elevated 2.35>>3.32. 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 Torsemide  was increased to 40 mg bid.  Patient was admitted in 1/25 with multifocal PNA and volume overload at [facility].  He was diuresed and was treated with antibiotics.  He was sent home on oxygen  but is no longer using it. Echo in 1/25 showed EF 65-70%, moderate RV enlargement with moderate RV dysfunction, severe biatrial enlargement, mild mitral stenosis with mean gradient 4.7 mmHg, moderate TR, mild aortic stenosis with mean gradient 11 mmHg, PASP 76 mmHg.   Today he returns for HF follow up with his step-daughter. Overall feeling fair. He is SOB walking short distances on flat ground, urinating briskly. Feels full of fluid, symptoms started a couple weeks ago. Legs are swelling. Denies palpitations, abnormal bleeding, CP, dizziness, or PND/Orthopnea. Appetite ok. Taking all medications. BP at home 130-170s/50-70s. Wife at home with knee injury.  ReDs reading: 58%, abnormal  Labs (2/25): K 3.9, creatinine 1.75, LDL 50 Labs (3/25): K 4.5, creatinine 2.29 Labs (6/25): K 4.5, creatinine 2.15 Labs (10/25): K 5.1, creatinine 1.99 Labs (12/25): K  4.4, creatinine 2.60  6 minute walk (7/24): 259 m 6 minute walk (9/24): 195 m 6 minutes walk (5/25): 243 m 6 minute walk  today 08/12/24: 274 m  PMH: 1. HTN 2. Hyperlipidemia 3. Type 2 diabetes 4. CKD stage 3 5. Atrial fibrillation: Permanent.  6. Carotid stenosis: CTA neck (6/23) with high grade stenosis of the bilateral internal carotid arteries.  - S/p left TCAR 9/23.  - S/p right TCAR 10/23. - Carotid doppler (9/24): LICA stent 50-75% stenosis, RICA stent 50-75% stenosis.  7. 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  - R/LHC (7/23): no significant CAD; RA mean 25, PA 115/30 (58), LVEDP 22 mmHg, PCWP not obtained, Fick CO/CI 6.4/3.1, PAPi 3.4. PVR 5.6 WU. - RHC (8/23): Mean RA 8, PA 85/40 mean 41, mean PCWP 22, CI 2.86, PVR 3.4 WU - Echo (7/24): EF 60-65%, D-shaped interventricular septum suggestive of RV pressure/volume overload, moderate RV enlargement with mildly decreased systolic function, PASP 78 mmHg, mild mitral stenosis mean gradient 4, mild aortic stenosis mean gradient 15.  - RHC (7/24): RA mean 9, PA 78/16 (44), PCWP 17 (prominent v-waves to 31), CO/CI (Fick) 7.63/4.09, PVR 3.54 WU - Echo (1/25): EF 65-70%, moderate RV enlargement with moderate RV dysfunction, severe biatrial enlargement, mild mitral stenosis with mean gradient 4.7 mmHg, moderate TR, mild aortic stenosis with mean gradient 11 mmHg, PASP 76 mmHg.  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  See RHC and echo data above.   - V/Q scan (7/23): No evidence for chronic PE.  - High resolution CT chest (7/23): No definite ILD.  - + ANA -  RHC (7/24): RA mean 9, PA 78/16 (44), PCWP 17 (prominent v-waves to 31), CO/CI (Fick) 7.63/4.09, PVR 3.54 WU  Current Outpatient Medications  Medication Sig Dispense Refill    aspirin  EC 81 MG tablet Take 1 tablet (81 mg total) by mouth daily. Swallow whole.     atorvastatin  (LIPITOR) 40 MG tablet Take 40 mg by mouth at bedtime.     cholecalciferol  (VITAMIN D ) 25 MCG (1000 UNIT) tablet Take 1,000 Units by mouth daily.     FARXIGA  10 MG TABS tablet TAKE 1 TABLET BY MOUTH DAILY 30 tablet 4   fish oil-omega-3 fatty acids  1000 MG capsule Take 1 g by mouth every evening.     hydrALAZINE  (APRESOLINE ) 25 MG tablet Take 2 tablets (50 mg total) by mouth 3 (three) times daily. 200 tablet 3   macitentan  (OPSUMIT ) 10 MG tablet TAKE 1 TABLET BY MOUTH DAILY 90 tablet 3   Misc Natural Products (GLUCOS-CHONDROIT-MSM COMPLEX) TABS Take 2 tablets by mouth daily.     Multiple Vitamins-Minerals (MULTIVITAMIN WITH MINERALS) tablet Take 1 tablet by mouth in the morning.  Centrum Silver     niacin  500 MG tablet Take 500 mg by mouth in the morning.     potassium chloride  (KLOR-CON  M) 10 MEQ tablet TAKE 1 TABLET BY MOUTH DAILY 90 tablet 3   tadalafil  (CIALIS ) 20 MG tablet TAKE 2 TABLETS BY MOUTH DAILY 180 tablet 3   torsemide  (DEMADEX ) 20 MG tablet Take 3 tablets (60 mg total) by mouth daily.     warfarin (COUMADIN ) 5 MG tablet Take 5-7.5 mg by mouth See admin  instructions. Take 5 mg on Saturday,Sunday Tues, Wed, Thursday, and then  Take 7.5 mg on Monday,  Wednesday, and Friday     LOKELMA  10 g PACK packet TAKE 10 GRAM BY MOUTH AS DIRECTED (Patient not taking: Reported on 10/19/2024) 11 packet 2   No current facility-administered medications for this encounter.   Allergies  Allergen Reactions   Other Rash    Bandaids   Social History   Socioeconomic History   Marital status: Married    Spouse name: Tammy   Number of children: Not on file   Years of education: Not on file   Highest education level: Not on file  Occupational History   Occupation: Full time    Comment: Contractor  Tobacco Use   Smoking status: Never    Passive exposure: Never   Smokeless tobacco: Never    Tobacco comments:    Never smoke 05/18/22  Vaping Use   Vaping status: Never Used  Substance and Sexual Activity   Alcohol  use: No    Alcohol /week: 0.0 standard drinks of alcohol    Drug use: No   Sexual activity: Not Currently  Other Topics Concern   Not on file  Social History Narrative   Not on file   Social Drivers of Health   Tobacco Use: Low Risk (10/19/2024)   Patient History    Smoking Tobacco Use: Never    Smokeless Tobacco Use: Never    Passive Exposure: Never  Financial Resource Strain: Not on file  Food Insecurity: No Food Insecurity (11/13/2023)   Hunger Vital Sign    Worried About Running Out of Food in the Last Year: Never true    Ran Out of Food in the Last Year: Never true  Transportation Needs: No Transportation Needs (11/13/2023)   PRAPARE - Administrator, Civil Service (Medical): No    Lack of Transportation (Non-Medical): No  Physical Activity: Not on file  Stress: Not on file  Social Connections: Socially Integrated (11/13/2023)   Social Connection and Isolation Panel    Frequency of Communication with Friends and Family: Three times a week    Frequency of Social Gatherings with Friends and Family: Once a week    Attends Religious Services: 1 to 4 times per year    Active Member of [employer] or Organizations: Yes    Attends Banker Meetings: 1 to 4 times per year    Marital Status: Married  Catering Manager Violence: Not At Risk (11/13/2023)   Humiliation, Afraid, Rape, and Kick questionnaire    Fear of Current or Ex-Partner: No    Emotionally Abused: No    Physically Abused: No    Sexually Abused: No  Depression (PHQ2-9): Low Risk (08/24/2024)   Depression (PHQ2-9)    PHQ-2 Score: 1  Alcohol  Screen: Not on file  Housing: Low Risk (11/13/2023)   Housing Stability Vital Sign    Unable to Pay for Housing in the Last Year: No    Number of Times Moved in the Last Year: 0    Homeless in the Last Year: No  Utilities: Not At Risk  (11/13/2023)   AHC Utilities    Threatened with loss of utilities: No  Health Literacy: Not on file   Family History  Problem Relation Age of Onset   Stroke Other    Heart disease Other    BP (!) 170/50   Pulse (!) 51   Wt 87.2 kg (192 lb 3.2 oz)   SpO2 91%  BMI 31.02 kg/m   Wt Readings from Last 3 Encounters:  10/19/24 87.2 kg (192 lb 3.2 oz)  10/01/24 83.2 kg (183 lb 6.4 oz)  08/24/24 77.4 kg (170 lb 11.2 oz)   PHYSICAL EXAM: General:  NAD. No resp difficulty, walked into clinic with cane, elderly HEENT: Normal Neck: Supple. Thick neck Cor: Brady irregular rate & rhythm. No rubs, gallops or murmurs. Lungs: Crackles in bases Abdomen: +distended.  Extremities: No cyanosis, clubbing, rash, 3+ BLE edema Neuro: Alert & oriented x 3, moves all 4 extremities w/o difficulty. Affect pleasant.  ASSESSMENT & PLAN: 1. 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 RHC/LHC 7/23 showed preserved cardiac output with severe pulmonary hypertension. 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  PA pressure was still high, but is significantly lower than prior RHC.  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 Echo in 1/25 showed EF 65-70%, moderate RV enlargement with moderate RV dysfunction, severe biatrial enlargement, mild mitral stenosis with mean gradient 4.7 mmHg, moderate TR, mild aortic stenosis with mean gradient 11 mmHg, PASP 76  mmHg. NYHA III, functional class confounded by deconditioning. He is markedly volume overloaded, weight up 20 lbs. ReDs 58%  - Use Furoscix  + 40 KCL daily x 3 days, hold torsemide  while using Furoscix  - After 3 days, resume torsemide  60 mg daily + 10 KCL daily. BMET and BNP today. - Continue torsemide  60 mg daily. - Continue Farxiga  10 mg daily.  - Off olmesartan with worsening SCr. - May need to consider repeat echo if not improving at follow-up 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  Some improvement on 8/23 RHC still with severe mixed PAH/PVH with PVR down to 3.4 WU.  He has never smoked and has no known lung disease.  He has been on warfarin for atrial fibrillation long-term. No known rheumatologic illness.  Not a drinker though he has some signs of cirrhosis by prior liver imaging.  V/Q scan in 7/23 negative for chronic PEs and HRCT chest without definite ILD. Serologic workup showed anti-SCL 70 neg, anti-centromere Ab neg, RF neg, ANA with reflex positive. Suspicion for component of group 1 PH (mixed group 1 and 2).  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   - Diuretics as above. - Continue Opsumit  10 mg daily. - Continue tadalafil  40 mg daily. - Has OSA but unable to tolerate CPAP.   - Participating in rehab at [facility].  3. Atrial fibrillation: Permanent.   - Continue warfarin.   - Bradycardic, does not need nodal blocker. Asymptomatic 4. DM2: Continue SGLT2i 5. Carotid stenosis: CTA neck in 6/23 showed high grade bilateral ICA stenoses. He has had awake L TCAR 9/23 and R TCAR 10/23.  - He is on warfarin long-term for AF.    - Continue statin, goal LDL < 55.  Good lipids in 2/25.  6. HTN: BP elevated,  patient states he has white coat syndrome. Home BP labile - Continue  hydralazine  as above, may need to increase next visit after diuresis. - Continue to check BP at home. - Off olmesartan in setting of CKD 7. CKD stage IIIb: Last SCr 2.6 (10/01/24) - Continue SGLT2i. BMET today. 8. Aortic stenosis: Mild on 1/25 echo.  9. Mitral stenosis: Mild on 1/25 echo.   Follow up in 1 week with [facility]. He is high risk for re-admission.   Harlene [facility] FNP-BC  10/19/2024 "

## 2024-10-20 ENCOUNTER — Other Ambulatory Visit: Payer: Self-pay

## 2024-10-20 ENCOUNTER — Encounter (HOSPITAL_COMMUNITY): Payer: Self-pay | Admitting: *Deleted

## 2024-10-20 ENCOUNTER — Other Ambulatory Visit (HOSPITAL_COMMUNITY): Payer: Self-pay

## 2024-10-20 DIAGNOSIS — I272 Pulmonary hypertension, unspecified: Secondary | ICD-10-CM

## 2024-10-20 NOTE — Progress Notes (Signed)
 Pulmonary Individual Treatment Plan  Patient Details  Name: Robert Baldwin MRN: [id number] Date of Birth: [birth year] Referring Provider:   Flowsheet Row PULMONARY REHAB OTHER RESP ORIENTATION from 08/24/2024 in [facility] CARDIAC REHABILITATION  Referring Provider Rolan Barrack MD    Initial Encounter Date:  Flowsheet Row PULMONARY REHAB OTHER RESP ORIENTATION from 08/24/2024 in [facility]  Date 08/24/24    Visit Diagnosis: Pulmonary hypertension (HCC)  Patient's Home Medications on Admission:  Current Medications[1]  Past Medical History: Past Medical History:  Diagnosis Date   (HFpEF) heart failure with preserved ejection fraction (HCC)    Arthritis    Atrial fibrillation (HCC)    Bradycardia    Carotid artery disease    Hyperlipidemia    Hypertension    Pulmonary hypertension (HCC)    Type 2 diabetes mellitus (HCC)     Tobacco Use: Tobacco Use History[2]  Labs: Review Flowsheet  More data exists      Latest Ref Rng & Units 05/14/2023 05/21/2023 11/13/2023 11/14/2023 12/18/2023  Labs for ITP Cardiac and Pulmonary Rehab  Cholestrol 0 - 200 mg/dL 98  - - - 881   LDL (calc) 0 - 99 mg/dL 47  - - - 50   HDL-C >59 mg/dL 42  - - - 59   Trlycerides <150 mg/dL 44  - - - 45   Hemoglobin A1c 4.8 - 5.6 % - - - 7.4  -  PH, Arterial 7.35 - 7.45 - 7.402  7.415  - - -  PCO2 arterial 32 - 48 mmHg - 41.4  40.9  - - -  Bicarbonate 20.0 - 28.0 mmol/L - 25.7  26.2  31.9  - -  TCO2 22 - 32 mmol/L - 27  27  29   - -  O2 Saturation % - 73  75  86.4  - -    Details       Multiple values from one day are sorted in reverse-chronological order         Capillary Blood Glucose: Lab Results  Component Value Date   GLUCAP 124 (H) 02/05/2024   GLUCAP 156 (H) 02/05/2024   GLUCAP 155 (H) 02/03/2024   GLUCAP 130 (H) 01/29/2024   GLUCAP 138 (H) 01/29/2024     Pulmonary Assessment Scores:  Pulmonary Assessment Scores     Row Name 08/24/24 0843         ADL UCSD    ADL Phase Entry     SOB Score total 17     Rest 2     Walk 2     Stairs 2     Bath 0     Dress 0     Shop 0       CAT Score   CAT Score 11       mMRC Score   mMRC Score 2       UCSD: Self-administered rating of dyspnea associated with activities of daily living (ADLs) 6-point scale (0 = not at all to 5 = maximal or unable to do because of breathlessness)  Scoring Scores range from 0 to 120.  Minimally important difference is 5 units  CAT: CAT can identify the health impairment of COPD patients and is better correlated with disease progression.  CAT has a scoring range of zero to 40. The CAT score is classified into four groups of low (less than 10), medium (10 - 20), high (21-30) and very high (31-40) based on the impact level of disease  on health status. A CAT score over 10 suggests significant symptoms.  A worsening CAT score could be explained by an exacerbation, poor medication adherence, poor inhaler technique, or progression of COPD or comorbid conditions.  CAT MCID is 2 points  mMRC: mMRC (Modified Medical Research Council) Dyspnea Scale is used to assess the degree of baseline functional disability in patients of respiratory disease due to dyspnea. No minimal important difference is established. A decrease in score of 1 point or greater is considered a positive change.   Pulmonary Function Assessment:   Exercise Target Goals: Exercise Program Goal: Individual exercise prescription set using results from initial 6 min walk test and THRR while considering  patients activity barriers and safety.   Exercise Prescription Goal: Initial exercise prescription builds to 30-45 minutes a day of aerobic activity, 2-3 days per week.  Home exercise guidelines will be given to patient during program as part of exercise prescription that the participant will acknowledge.  Activity Barriers & Risk Stratification:  Activity Barriers & Cardiac Risk Stratification - 08/24/24 0853        Activity Barriers & Cardiac Risk Stratification   Activity Barriers Deconditioning;Assistive Device;Shortness of Breath;History of Falls;Balance Concerns;Muscular Weakness          6 Minute Walk:  6 Minute Walk     Row Name 08/24/24 0852         6 Minute Walk   Phase Initial     Distance 710 feet     Walk Time 5.75 minutes     # of Rest Breaks 1  15 sec to tie shoe     MPH 1.4     METS 1.03     RPE 15     Perceived Dyspnea  2     VO2 Peak 3.6     Symptoms Yes (comment)     Comments SOB, fatigue     Resting HR 40 bpm     Resting BP 160/60     Resting Oxygen  Saturation  94 %     Exercise Oxygen  Saturation  during 6 min walk 86 %     Max Ex. HR 68 bpm     Max Ex. BP 170/64     2 Minute Post BP 124/66       Interval HR   1 Minute HR 54     2 Minute HR 54     3 Minute HR 56     4 Minute HR 62     5 Minute HR 58     6 Minute HR 57     2 Minute Post HR 44     Interval Heart Rate? Yes       Interval Oxygen    Interval Oxygen ? Yes     Baseline Oxygen  Saturation % 94 %     1 Minute Oxygen  Saturation % 87 %     1 Minute Liters of Oxygen  0 L  Room Air     2 Minute Oxygen  Saturation % 87 %     2 Minute Liters of Oxygen  0 L     3 Minute Oxygen  Saturation % 86 %     3 Minute Liters of Oxygen  0 L     4 Minute Oxygen  Saturation % 87 %     4 Minute Liters of Oxygen  0 L     5 Minute Oxygen  Saturation % 87 %     5 Minute Liters of Oxygen  0 L     6 Minute  Oxygen  Saturation % 87 %     6 Minute Liters of Oxygen  0 L     2 Minute Post Oxygen  Saturation % 90 %     2 Minute Post Liters of Oxygen  0 L        Oxygen  Initial Assessment:  Oxygen  Initial Assessment - 08/24/24 0857       Home Oxygen    Home Oxygen  Device None    Sleep Oxygen  Prescription None    Home Exercise Oxygen  Prescription None    Home Resting Oxygen  Prescription None      Initial 6 min Walk   Oxygen  Used None      Program Oxygen  Prescription   Program Oxygen  Prescription None       Intervention   Short Term Goals To learn and exhibit compliance with exercise, home and travel O2 prescription;To learn and understand importance of monitoring SPO2 with pulse oximeter and demonstrate accurate use of the pulse oximeter.;To learn and understand importance of maintaining oxygen  saturations>88%;To learn and demonstrate proper pursed lip breathing techniques or other breathing techniques.     Long  Term Goals Exhibits compliance with exercise, home  and travel O2 prescription;Maintenance of O2 saturations>88%;Exhibits proper breathing techniques, such as pursed lip breathing or other method taught during program session;Verbalizes importance of monitoring SPO2 with pulse oximeter and return demonstration          Oxygen  Re-Evaluation:   Oxygen  Discharge (Final Oxygen  Re-Evaluation):   Initial Exercise Prescription:  Initial Exercise Prescription - 08/24/24 0800       Date of Initial Exercise RX and Referring Provider   Date 08/24/24    Referring Provider Rolan Barrack MD      Oxygen    Maintain Oxygen  Saturation 88% or higher      NuStep   Level 1    SPM 80    Minutes 15    METs 1.5      Track   Laps 9    Minutes 15    METs 1.5      Prescription Details   Frequency (times per week) 2    Duration Progress to 30 minutes of continuous aerobic without signs/symptoms of physical distress      Intensity   THRR 40-80% of Max Heartrate 79-118    Ratings of Perceived Exertion 11-13    Perceived Dyspnea 0-4      Progression   Progression Continue to progress workloads to maintain intensity without signs/symptoms of physical distress.      Resistance Training   Training Prescription Yes    Weight 3 lb    Reps 10-15          Perform Capillary Blood Glucose checks as needed.  Exercise Prescription Changes:   Exercise Prescription Changes     Row Name 08/24/24 0800 09/14/24 1000           Response to Exercise   Blood Pressure (Admit) 160/60 120/71       Blood Pressure (Exercise) 170/64 132/72      Blood Pressure (Exit) 124/62 122/68      Heart Rate (Admit) 40 bpm 38 bpm      Heart Rate (Exercise) 68 bpm 50 bpm      Heart Rate (Exit) 44 bpm 43 bpm      Oxygen  Saturation (Admit) 94 % 91 %      Oxygen  Saturation (Exercise) 86 % 88 %      Oxygen  Saturation (Exit) 91 % 93 %  Rating of Perceived Exertion (Exercise) 15 11      Perceived Dyspnea (Exercise) 2 1      Symptoms SOb, fatigue --      Comments walk test results --      Duration -- Continue with 30 min of aerobic exercise without signs/symptoms of physical distress.      Intensity -- THRR unchanged        Progression   Progression -- Continue to progress workloads to maintain intensity without signs/symptoms of physical distress.        Resistance Training   Training Prescription -- Yes      Weight -- 3      Reps -- 10-15        NuStep   Level -- 2      SPM -- 75      Minutes -- 30      METs -- 1.9         Exercise Comments:   Exercise Comments     Row Name 08/31/24 0905           Exercise Comments First full day of exercise!  Patient was oriented to gym and equipment including functions, settings, policies, and procedures.  Patient's individual exercise prescription and treatment plan were reviewed.  All starting workloads were established based on the results of the 6 minute walk test done at initial orientation visit.  The plan for exercise progression was also introduced and progression will be customized based on patient's performance and goals.          Exercise Goals and Review:   Exercise Goals     Row Name 08/24/24 0855             Exercise Goals   Increase Physical Activity Yes       Intervention Provide advice, education, support and counseling about physical activity/exercise needs.;Develop an individualized exercise prescription for aerobic and resistive training based on initial evaluation findings, risk stratification, comorbidities and  participant's personal goals.       Expected Outcomes Short Term: Attend rehab on a regular basis to increase amount of physical activity.;Long Term: Add in home exercise to make exercise part of routine and to increase amount of physical activity.;Long Term: Exercising regularly at least 3-5 days a week.       Increase Strength and Stamina Yes       Intervention Provide advice, education, support and counseling about physical activity/exercise needs.;Develop an individualized exercise prescription for aerobic and resistive training based on initial evaluation findings, risk stratification, comorbidities and participant's personal goals.       Expected Outcomes Short Term: Increase workloads from initial exercise prescription for resistance, speed, and METs.;Short Term: Perform resistance training exercises routinely during rehab and add in resistance training at home;Long Term: Improve cardiorespiratory fitness, muscular endurance and strength as measured by increased METs and functional capacity ( )       Able to understand and use rate of perceived exertion (RPE) scale Yes       Intervention Provide education and explanation on how to use RPE scale       Expected Outcomes Short Term: Able to use RPE daily in rehab to express subjective intensity level;Long Term:  Able to use RPE to guide intensity level when exercising independently       Able to understand and use Dyspnea scale Yes       Intervention Provide education and explanation on how to use Dyspnea scale  Expected Outcomes Short Term: Able to use Dyspnea scale daily in rehab to express subjective sense of shortness of breath during exertion;Long Term: Able to use Dyspnea scale to guide intensity level when exercising independently       Knowledge and understanding of Target Heart Rate Range (THRR) Yes       Intervention Provide education and explanation of THRR including how the numbers were predicted and where they are located for  reference       Expected Outcomes Short Term: Able to use daily as guideline for intensity in rehab;Long Term: Able to use THRR to govern intensity when exercising independently;Short Term: Able to state/look up THRR       Able to check pulse independently Yes       Intervention Provide education and demonstration on how to check pulse in carotid and radial arteries.;Review the importance of being able to check your own pulse for safety during independent exercise       Expected Outcomes Short Term: Able to explain why pulse checking is important during independent exercise;Long Term: Able to check pulse independently and accurately       Understanding of Exercise Prescription Yes       Intervention Provide education, explanation, and written materials on patient's individual exercise prescription       Expected Outcomes Short Term: Able to explain program exercise prescription;Long Term: Able to explain home exercise prescription to exercise independently          Exercise Goals Re-Evaluation :  Exercise Goals Re-Evaluation     Row Name 08/31/24 0905             Exercise Goal Re-Evaluation   Exercise Goals Review Increase Physical Activity;Increase Strength and Stamina;Able to understand and use rate of perceived exertion (RPE) scale;Able to understand and use Dyspnea scale;Able to check pulse independently;Knowledge and understanding of Target Heart Rate Range (THRR)       Comments Reviewed RPE and dyspnea scale, THR and program prescription with pt today.  Pt voiced understanding and was given a copy of goals to take home.       Expected Outcomes Short: Use RPE daily to regulate intensity.  Long: Follow program prescription in THR.          Discharge Exercise Prescription (Final Exercise Prescription Changes):  Exercise Prescription Changes - 09/14/24 1000       Response to Exercise   Blood Pressure (Admit) 120/71    Blood Pressure (Exercise) 132/72    Blood Pressure (Exit)  122/68    Heart Rate (Admit) 38 bpm    Heart Rate (Exercise) 50 bpm    Heart Rate (Exit) 43 bpm    Oxygen  Saturation (Admit) 91 %    Oxygen  Saturation (Exercise) 88 %    Oxygen  Saturation (Exit) 93 %    Rating of Perceived Exertion (Exercise) 11    Perceived Dyspnea (Exercise) 1    Duration Continue with 30 min of aerobic exercise without signs/symptoms of physical distress.    Intensity THRR unchanged      Progression   Progression Continue to progress workloads to maintain intensity without signs/symptoms of physical distress.      Resistance Training   Training Prescription Yes    Weight 3    Reps 10-15      NuStep   Level 2    SPM 75    Minutes 30    METs 1.9          Nutrition:  Target  Goals: Understanding of nutrition guidelines, daily intake of sodium 1500mg , cholesterol 200mg , calories 30% from fat and 7% or less from saturated fats, daily to have 5 or more servings of fruits and vegetables.  Biometrics:  Pre Biometrics - 08/24/24 0855       Pre Biometrics   Height 5' 6 (1.676 m)    Weight 170 lb 11.2 oz (77.4 kg)    Waist Circumference 39.25 inches    Hip Circumference 40 inches    Waist to Hip Ratio 0.98 %    BMI (Calculated) 27.56    Grip Strength 17.4 kg    Single Leg Stand 2.9 seconds           Nutrition Therapy Plan and Nutrition Goals:  Nutrition Therapy & Goals - 08/24/24 0856       Intervention Plan   Intervention Prescribe, educate and counsel regarding individualized specific dietary modifications aiming towards targeted core components such as weight, hypertension, lipid management, diabetes, heart failure and other comorbidities.;Nutrition handout(s) given to patient.    Expected Outcomes Short Term Goal: Understand basic principles of dietary content, such as calories, fat, sodium, cholesterol and nutrients.;Long Term Goal: Adherence to prescribed nutrition plan.          Nutrition Assessments:  MEDIFICTS Score Key: >=70 Need  to make dietary changes  40-70 Heart Healthy Diet <= 40 Therapeutic Level Cholesterol Diet  Flowsheet Row PULMONARY REHAB OTHER RESP ORIENTATION from 08/24/2024 in [facility] CARDIAC REHABILITATION  Picture Your Plate Total Score on Admission 65   Picture Your Plate Scores: <59 Unhealthy dietary pattern with much room for improvement. 41-50 Dietary pattern unlikely to meet recommendations for good health and room for improvement. 51-60 More healthful dietary pattern, with some room for improvement.  >60 Healthy dietary pattern, although there may be some specific behaviors that could be improved.    Nutrition Goals Re-Evaluation:   Nutrition Goals Discharge (Final Nutrition Goals Re-Evaluation):   Psychosocial: Target Goals: Acknowledge presence or absence of significant depression and/or stress, maximize coping skills, provide positive support system. Participant is able to verbalize types and ability to use techniques and skills needed for reducing stress and depression.  Initial Review & Psychosocial Screening:  Initial Psych Review & Screening - 08/19/24 1022       Family Dynamics   Good Support System? Yes    Comments Patient's wife is his main support.      Barriers   Psychosocial barriers to participate in program There are no identifiable barriers or psychosocial needs.;The patient should benefit from training in stress management and relaxation.      Screening Interventions   Interventions Encouraged to exercise;To provide support and resources with identified psychosocial needs;Provide feedback about the scores to participant    Expected Outcomes Short Term goal: Utilizing psychosocial counselor, staff and physician to assist with identification of specific Stressors or current issues interfering with healing process. Setting desired goal for each stressor or current issue identified.;Long Term Goal: Stressors or current issues are controlled or eliminated.;Short Term  goal: Identification and review with participant of any Quality of Life or Depression concerns found by scoring the questionnaire.;Long Term goal: The participant improves quality of Life and PHQ9 Scores as seen by post scores and/or verbalization of changes          Quality of Life Scores:  Scores of 19 and below usually indicate a poorer quality of life in these areas.  A difference of  2-3 points is a clinically meaningful difference.  A difference of 2-3 points in the total score of the Quality of Life Index has been associated with significant improvement in overall quality of life, self-image, physical symptoms, and general health in studies assessing change in quality of life.   PHQ-9: Review Flowsheet       08/24/2024 04/29/2024 01/23/2024  Depression screen PHQ 2/9  Decreased Interest 0 0 0  Down, Depressed, Hopeless 0 0 0  PHQ - 2 Score 0 0 0  Altered sleeping 0 0 0  Tired, decreased energy 1 1 0  Change in appetite 0 0 0  Feeling bad or failure about yourself  0 0 0  Trouble concentrating 0 0 0  Moving slowly or fidgety/restless 0 0 0  Suicidal thoughts 0 0 0  PHQ-9 Score 1  1  0   Difficult doing work/chores Not difficult at all Not difficult at all -    Details       Data saved with a previous flowsheet row definition        Interpretation of Total Score  Total Score Depression Severity:  1-4 = Minimal depression, 5-9 = Mild depression, 10-14 = Moderate depression, 15-19 = Moderately severe depression, 20-27 = Severe depression   Psychosocial Evaluation and Intervention:   Psychosocial Re-Evaluation:   Psychosocial Discharge (Final Psychosocial Re-Evaluation):    Education: Education Goals: Education classes will be provided on a weekly basis, covering required topics. Participant will state understanding/return demonstration of topics presented.  Learning Barriers/Preferences:  Learning Barriers/Preferences - 08/19/24 1027       Learning  Barriers/Preferences   Learning Barriers Sight    Learning Preferences Pictoral;Written Material          Education Topics: How Lungs Work and Diseases: - Discuss the anatomy of the lungs and diseases that can affect the lungs, such as COPD. Flowsheet Row PULMONARY REHAB OTHER RESPIRATORY from 04/29/2024 in [facility]  Date 03/25/24  Educator hb  Instruction Review Code 1- Verbalizes Understanding    Exercise: -Discuss the importance of exercise, FITT principles of exercise, normal and abnormal responses to exercise, and how to exercise safely.   Environmental Irritants: -Discuss types of environmental irritants and how to limit exposure to environmental irritants.   Meds/Inhalers and oxygen : - Discuss respiratory medications, definition of an inhaler and oxygen , and the proper way to use an inhaler and oxygen .   Energy Saving Techniques: - Discuss methods to conserve energy and decrease shortness of breath when performing activities of daily living.  Flowsheet Row PULMONARY REHAB OTHER RESPIRATORY from 04/29/2024 in [place] [facility]  Date 04/15/24  Educator HB  Instruction Review Code 1- Verbalizes Understanding    Bronchial Hygiene / Breathing Techniques: - Discuss breathing mechanics, pursed-lip breathing technique,  proper posture, effective ways to clear airways, and other functional breathing techniques   Cleaning Equipment: - Provides group verbal and written instruction about the health risks of elevated stress, cause of high stress, and healthy ways to reduce stress.   Nutrition I: Fats: - Discuss the types of cholesterol, what cholesterol does to the body, and how cholesterol levels can be controlled. Flowsheet Row PULMONARY REHAB OTHER RESPIRATORY from 04/29/2024 in [place] [facility]  Date 04/29/24  Educator jh  Instruction Review Code 1- Verbalizes Understanding    Nutrition II: Labels: -Discuss the  different components of food labels and how to read food labels. Flowsheet Row PULMONARY REHAB OTHER RESPIRATORY from 04/29/2024 in [place]  Date 04/29/24  Educator  jh  Instruction Review Code 1- Verbalizes Understanding    Respiratory Infections: - Discuss the signs and symptoms of respiratory infections, ways to prevent respiratory infections, and the importance of seeking medical treatment when having a respiratory infection.   Stress I: Signs and Symptoms: - Discuss the causes of stress, how stress may lead to anxiety and depression, and ways to limit stress. Flowsheet Row PULMONARY REHAB OTHER RESPIRATORY from 04/29/2024 in [place] [facility]  Date 03/04/24  Educator [facility]  Instruction Review Code 1- Verbalizes Understanding    Stress II: Relaxation: -Discuss relaxation techniques to limit stress. Flowsheet Row PULMONARY REHAB OTHER RESPIRATORY from 04/29/2024 in [place]  Date 03/04/24  Educator [facility]  Instruction Review Code 1- Verbalizes Understanding    Oxygen  for Home/Travel: - Discuss how to prepare for travel when on oxygen  and proper ways to transport and store oxygen  to ensure safety.   Knowledge Questionnaire Score:  Knowledge Questionnaire Score - 08/24/24 0838       Knowledge Questionnaire Score   Pre Score 12/18          Core Components/Risk Factors/Patient Goals at Admission:  Personal Goals and Risk Factors at Admission - 08/24/24 0856       Core Components/Risk Factors/Patient Goals on Admission    Weight Management Obesity;Weight Maintenance;Yes    Intervention Weight Management: Develop a combined nutrition and exercise program designed to reach desired caloric intake, while maintaining appropriate intake of nutrient and fiber, sodium and fats, and appropriate energy expenditure required for the weight goal.;Weight Management: Provide education and appropriate resources to help participant  work on and attain dietary goals.;Weight Management/Obesity: Establish reasonable short term and long term weight goals.;Obesity: Provide education and appropriate resources to help participant work on and attain dietary goals.    Admit Weight 170 lb 11.2 oz (77.4 kg)    Goal Weight: Short Term 165 lb (74.8 kg)    Goal Weight: Long Term 160 lb (72.6 kg)    Expected Outcomes Short Term: Continue to assess and modify interventions until short term weight is achieved;Long Term: Adherence to nutrition and physical activity/exercise program aimed toward attainment of established weight goal;Weight Maintenance: Understanding of the daily nutrition guidelines, which includes 25-35% calories from fat, 7% or less cal from saturated fats, less than 200mg  cholesterol, less than 1.5gm of sodium, & 5 or more servings of fruits and vegetables daily;Understanding of distribution of calorie intake throughout the day with the consumption of 4-5 meals/snacks;Understanding recommendations for meals to include 15-35% energy as protein, 25-35% energy from fat, 35-60% energy from carbohydrates, less than 200mg  of dietary cholesterol, 20-35 gm of total fiber daily    Improve shortness of breath with ADL's Yes    Intervention Provide education, individualized exercise plan and daily activity instruction to help decrease symptoms of SOB with activities of daily living.    Expected Outcomes Short Term: Improve cardiorespiratory fitness to achieve a reduction of symptoms when performing ADLs;Long Term: Be able to perform more ADLs without symptoms or delay the onset of symptoms    Increase knowledge of respiratory medications and ability to use respiratory devices properly  Yes    Intervention Provide education and demonstration as needed of appropriate use of medications, inhalers, and oxygen  therapy.    Expected Outcomes Short Term: Achieves understanding of medications use. Understands that oxygen  is a medication prescribed by  physician. Demonstrates appropriate use of inhaler and oxygen  therapy.;Long Term: Maintain appropriate use of medications, inhalers, and oxygen  therapy.  Diabetes Yes    Intervention Provide education about proper nutrition, including hydration, and aerobic/resistive exercise prescription along with prescribed medications to achieve blood glucose in normal ranges: Fasting glucose 65-99 mg/dL;Provide education about signs/symptoms and action to take for hypo/hyperglycemia.    Expected Outcomes Short Term: Participant verbalizes understanding of the signs/symptoms and immediate care of hyper/hypoglycemia, proper foot care and importance of medication, aerobic/resistive exercise and nutrition plan for blood glucose control.;Long Term: Attainment of HbA1C < 7%.    Heart Failure Yes    Intervention Provide a combined exercise and nutrition program that is supplemented with education, support and counseling about heart failure. Directed toward relieving symptoms such as shortness of breath, decreased exercise tolerance, and extremity edema.    Expected Outcomes Improve functional capacity of life;Short term: Attendance in program 2-3 days a week with increased exercise capacity. Reported lower sodium intake. Reported increased fruit and vegetable intake. Reports medication compliance.;Short term: Daily weights obtained and reported for increase. Utilizing diuretic protocols set by physician.;Long term: Adoption of self-care skills and reduction of barriers for early signs and symptoms recognition and intervention leading to self-care maintenance.    Hypertension Yes    Intervention Monitor prescription use compliance.;Provide education on lifestyle modifcations including regular physical activity/exercise, weight management, moderate sodium restriction and increased consumption of fresh fruit, vegetables, and low fat dairy, alcohol  moderation, and smoking cessation.    Expected Outcomes Short Term: Continued  assessment and intervention until BP is < 140/21mm HG in hypertensive participants. < 130/29mm HG in hypertensive participants with diabetes, heart failure or chronic kidney disease.;Long Term: Maintenance of blood pressure at goal levels.    Lipids Yes    Intervention Provide education and support for participant on nutrition & aerobic/resistive exercise along with prescribed medications to achieve LDL 70mg , HDL >40mg .    Expected Outcomes Short Term: Participant states understanding of desired cholesterol values and is compliant with medications prescribed. Participant is following exercise prescription and nutrition guidelines.;Long Term: Cholesterol controlled with medications as prescribed, with individualized exercise RX and with personalized nutrition plan. Value goals: LDL < 70mg , HDL > 40 mg.          Core Components/Risk Factors/Patient Goals Review:    Core Components/Risk Factors/Patient Goals at Discharge (Final Review):    ITP Comments:  ITP Comments     Row Name 08/24/24 0849 08/26/24 0810 08/31/24 0905 09/22/24 0929 10/20/24 1300   ITP Comments Patient arrived for 1st visit/orientation/education at 0800. Patient was referred to PR by Dr. Ezra Hoffman due to Pulmonary HTN. During orientation advised patient on arrival and appointment times what to wear, what to do before, during and after exercise. Reviewed attendance and class policy.  Pt is scheduled to return Pulmonary Rehab on 08/31/24 at 915. Pt was advised to come to class 15 minutes before class starts.  Discussed RPE/Dpysnea scales. Patient participated in warm up stretches. Patient was able to complete 6 minute walk test. Patient was measured for the equipment. Discussed equipment safety with patient. Took patient pre-anthropometric measurements. Patient finished visit at 845. 30 day review completed. ITP sent to Dr.Jehanzeb Memon, Medical Director of  Pulmonary Rehab. Continue with ITP unless changes are made by  physician. Pt new to the program, has not started yet. First full day of exercise!  Patient was oriented to gym and equipment including functions, settings, policies, and procedures.  Patient's individual exercise prescription and treatment plan were reviewed.  All starting workloads were established based on the results of the 6 minute  walk test done at initial orientation visit.  The plan for exercise progression was also introduced and progression will be customized based on patient's performance and goals. 30 day review completed. ITP sent to Dr.Jehanzeb Memon, Medical Director of  Pulmonary Rehab. Continue with ITP unless changes are made by physician. 30 day review completed. ITP sent to Dr.Jehanzeb Memon, Medical Director of  Pulmonary Rehab. Continue with ITP unless changes are made by physician.  Currently out on medical hold until January.  Last attended on 09/21/24.  Next follow up on 10/29/24      Comments: 30 day review      [1]  Current Outpatient Medications:    aspirin  EC 81 MG tablet, Take 1 tablet (81 mg total) by mouth daily. Swallow whole., Disp: , Rfl:    atorvastatin  (LIPITOR) 40 MG tablet, Take 40 mg by mouth at bedtime., Disp: , Rfl:    cholecalciferol  (VITAMIN D ) 25 MCG (1000 UNIT) tablet, Take 1,000 Units by mouth daily., Disp: , Rfl:    FARXIGA  10 MG TABS tablet, TAKE 1 TABLET BY MOUTH DAILY, Disp: 30 tablet, Rfl: 4   fish oil-omega-3 fatty acids  1000 MG capsule, Take 1 g by mouth every evening., Disp: , Rfl:    Furosemide  (FUROSCIX ) 80 MG/10ML CTKT, Inject 80 mg into the skin as directed. BY THE HEART FAILURE CLINIC ONLY., Disp: 10 each, Rfl: 1   hydrALAZINE  (APRESOLINE ) 25 MG tablet, Take 2 tablets (50 mg total) by mouth 3 (three) times daily., Disp: 200 tablet, Rfl: 3   LOKELMA  10 g PACK packet, TAKE 10 GRAM BY MOUTH AS DIRECTED (Patient not taking: Reported on 10/19/2024), Disp: 11 packet, Rfl: 2   macitentan  (OPSUMIT ) 10 MG tablet, TAKE 1 TABLET BY MOUTH DAILY, Disp:  90 tablet, Rfl: 3   Misc Natural Products (GLUCOS-CHONDROIT-MSM COMPLEX) TABS, Take 2 tablets by mouth daily., Disp: , Rfl:    Multiple Vitamins-Minerals (MULTIVITAMIN WITH MINERALS) tablet, Take 1 tablet by mouth in the morning.  Centrum Silver, Disp: , Rfl:    niacin  500 MG tablet, Take 500 mg by mouth in the morning., Disp: , Rfl:    potassium chloride  (KLOR-CON  M) 10 MEQ tablet, TAKE 1 TABLET BY MOUTH DAILY, Disp: 90 tablet, Rfl: 3   tadalafil  (CIALIS ) 20 MG tablet, TAKE 2 TABLETS BY MOUTH DAILY, Disp: 180 tablet, Rfl: 3   torsemide  (DEMADEX ) 20 MG tablet, Take 3 tablets (60 mg total) by mouth daily., Disp: , Rfl:    warfarin (COUMADIN ) 5 MG tablet, Take 5-7.5 mg by mouth See admin instructions. Take 5 mg on Saturday,Sunday Tues, Wed, Thursday, and then  Take 7.5 mg on Monday,  Wednesday, and Friday, Disp: , Rfl:  [2]  Social History Tobacco Use  Smoking Status Never   Passive exposure: Never  Smokeless Tobacco Never  Tobacco Comments   Never smoke 05/18/22

## 2024-10-21 ENCOUNTER — Encounter (HOSPITAL_COMMUNITY)

## 2024-10-23 ENCOUNTER — Telehealth (HOSPITAL_COMMUNITY): Payer: Self-pay | Admitting: Cardiology

## 2024-10-23 NOTE — Progress Notes (Signed)
 "  [facility]  PCP: Maree Isles, MD Primary Cardiologist: Dr. Debera Nephrologist: Dr. Macel HF Cardiologist: Dr. Rolan  HPI: Robert Baldwin is a 84 y.o. male with history of carotid artery stenosis, DM II, HTN, HLD, permanent atrial fibrillation. He follows with Dr. Debera in the cardiology clinic.    Saw [facility] for carotid artery stenosis in 6/23. Was noting more dyspnea with exertion and lower extremity edema for about a month. He was subsequently started on po lasix  20 mg daily.   He was seen by Dr. Debera in the clinic 04/26/22 for evaluation of recent symptoms and pre-operative evaluation in preparation for left CEA followed by staged right CEA. Echo day of the visit showed EF 70-75%, hyperdynamic LV function, mild LVH, interventricular septum flattened in systole and diastole consistent with RV pressure and volume overload, RV moderately enlarged and moderately reduced, RVSP 120 mmHg, moderate LAE, severe RAE, moderate pericardial effusion, severe TR, dilated IVC. Patient was scheduled for outpatient R/LHC to better assess hemodynamics and further characterize pulmonary hypertension.    [facility] 7/23 showed no significant CAD, RA mean 25, PA 115/30 (58), LVEDP 22 mmHg, PCWP not obtained, Fick CO/CI 6.4/3.1, PAPi 3.4. PVR 5.6 WU. 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 He was diuresed with IV lasix . V/Q scan negative for chronic PE. ANA+. All other serologic test -. HRCT with no definite ILD. Started on tadalafil . Diuresed 24 lbs. Hospitalization complicated by right groin hematoma and received 1 unit PRBCs. Discharged home, weight 179 lbs.  Repeat RHC 8/23 showed elevated PCWP and severe mixed pulmonary arterial/venous hypertension, but PA pressures significantly lower than prior RHC. He was instructed to increase torsemide  to 40 mg daily alternating with 20 mg every other day. Spiro and Entresto  held with  hyperkalemia.  Patient had awake left TCAR by Dr. Gretta in 9/23, no complications.  S/p right TCAR 10/23. Tolerated procedure well.   Echo 7/24 showed EF 60-65%, D-shaped interventricular septum suggestive of RV pressure/volume overload, moderate RV enlargement with mildly decreased systolic function, PASP 78 mmHg, mild mitral stenosis mean gradient 4, mild aortic stenosis mean gradient 15.   Follow up 7/24, creatinine elevated 2.35>>3.32. 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 Torsemide  was increased to 40 mg bid.  Patient was admitted in 1/25 with multifocal PNA and volume overload at [facility].  He was diuresed and was treated with antibiotics.  He was sent home on oxygen  but is no longer using it. Echo in 1/25 showed EF 65-70%, moderate RV enlargement with moderate RV dysfunction, severe biatrial enlargement, mild mitral stenosis with mean gradient 4.7 mmHg, moderate TR, mild aortic stenosis with mean gradient 11 mmHg, PASP 76 mmHg.   [facility] follow up last week he was massively volume overloaded with ReDS 58%. Today he returns for AHF follow up with his daughter. Overall feeling better but still with fluid on board. Denies palpitations, CP, dizziness, or PND/Orthopnea. Chronic edema in BLE. SOB with activity. Appetite ok, avoids salty foods. No fever or chills. Weight at home 187 pounds, has been down trending. Taking all medications. Denies ETOH, tobacco or drug use. Stays under 2L fluid intake. EMS called Tuesday with significant Epstaxis, paused his warfarin since then.   ReDs reading: 46 %, abnormal  6 minute walk (7/24): 259 m 6 minute walk (9/24): 195 m 6 minutes walk (5/25): 243 m 6 minute walk today 08/12/24: 274 m  PMH: 1. HTN 2.  Hyperlipidemia 3. Type 2 diabetes 4. CKD stage 3 5. Atrial fibrillation: Permanent.  6. Carotid stenosis: CTA neck (6/23) with high grade stenosis of the bilateral internal carotid arteries.  - S/p left TCAR 9/23.  - S/p right TCAR 10/23. - Carotid doppler (9/24): LICA stent 50-75% stenosis, RICA stent 50-75% stenosis.  7. 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  - R/LHC (7/23): no significant CAD; RA mean 25, PA 115/30 (58), LVEDP 22 mmHg, PCWP not obtained, Fick CO/CI 6.4/3.1, PAPi 3.4. PVR 5.6 WU. - RHC (8/23): Mean RA 8, PA 85/40 mean 41, mean PCWP 22, CI 2.86, PVR 3.4 WU - Echo (7/24): EF 60-65%, D-shaped interventricular septum suggestive of RV pressure/volume overload, moderate RV enlargement with mildly decreased systolic function, PASP 78 mmHg, mild mitral stenosis mean gradient 4, mild aortic stenosis mean gradient 15.  - RHC (7/24): RA mean 9, PA 78/16 (44), PCWP 17 (prominent v-waves to 31), CO/CI (Fick) 7.63/4.09, PVR 3.54 WU - Echo (1/25): EF 65-70%, moderate RV enlargement with moderate RV dysfunction, severe biatrial enlargement, mild mitral stenosis with mean gradient 4.7 mmHg, moderate TR, mild aortic stenosis with mean gradient 11 mmHg, PASP 76 mmHg.  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  See RHC and echo data above.   - V/Q scan (7/23): No evidence for chronic PE.  - High resolution CT chest (7/23): No definite ILD.  - + ANA -  RHC (7/24): RA mean 9, PA 78/16 (44), PCWP 17 (prominent v-waves to 31), CO/CI (Fick) 7.63/4.09, PVR 3.54 WU  Current Outpatient Medications  Medication Sig Dispense Refill   aspirin  EC 81 MG tablet Take 1 tablet (81  mg total) by mouth daily. Swallow whole.     atorvastatin  (LIPITOR) 40 MG tablet Take 40 mg by mouth at bedtime.     cholecalciferol  (VITAMIN D ) 25 MCG (1000 UNIT) tablet Take 1,000 Units by mouth daily.     FARXIGA  10 MG TABS tablet TAKE 1 TABLET BY MOUTH DAILY 30 tablet 4   fish oil-omega-3 fatty acids  1000 MG capsule Take 1 g by mouth every evening.     Furosemide  (FUROSCIX ) 80 MG/10ML CTKT Inject 80 mg into the skin as directed. BY THE HEART FAILURE CLINIC ONLY. 10 each 1   hydrALAZINE  (APRESOLINE ) 25 MG tablet Take 2 tablets (50 mg total) by mouth 3 (three) times daily. 200 tablet 3   LOKELMA  10 g PACK packet TAKE 10 GRAM BY MOUTH AS DIRECTED 11 packet 2   macitentan  (OPSUMIT ) 10 MG tablet TAKE 1 TABLET BY MOUTH DAILY 90 tablet 3   Misc Natural Products (GLUCOS-CHONDROIT-MSM COMPLEX) TABS Take 2 tablets by mouth daily.     Multiple Vitamins-Minerals (MULTIVITAMIN WITH MINERALS) tablet Take 1 tablet by mouth in the morning.  Centrum Silver     niacin  500 MG tablet Take 500 mg by mouth in the morning.     potassium chloride  (KLOR-CON  M) 10 MEQ tablet TAKE 1 TABLET BY MOUTH DAILY 90 tablet 3   tadalafil  (CIALIS ) 20 MG tablet TAKE 2 TABLETS BY  MOUTH DAILY 180 tablet 3   warfarin (COUMADIN ) 5 MG tablet Take 5-7.5 mg by mouth See admin instructions. Take 5 mg on Saturday,Sunday Tues, Wed, Thursday, and then  Take 7.5 mg on Monday,  Wednesday, and Friday     torsemide  (DEMADEX ) 20 MG tablet Take 4 tablets (80 mg total) by mouth daily. 60 tablet 1   No current facility-administered medications for this encounter.   Allergies  Allergen Reactions   Other Rash    Bandaids   Social History   Socioeconomic History   Marital status: Married    Spouse name: Tammy   Number of children: Not on file   Years of education: Not on file   Highest education level: Not on file  Occupational History   Occupation: Full time    Comment: Contractor  Tobacco Use   Smoking status: Never    Passive  exposure: Never   Smokeless tobacco: Never   Tobacco comments:    Never smoke 05/18/22  Vaping Use   Vaping status: Never Used  Substance and Sexual Activity   Alcohol  use: No    Alcohol /week: 0.0 standard drinks of alcohol    Drug use: No   Sexual activity: Not Currently  Other Topics Concern   Not on file  Social History Narrative   Not on file   Social Drivers of Health   Tobacco Use: Low Risk (10/29/2024)   Patient History    Smoking Tobacco Use: Never    Smokeless Tobacco Use: Never    Passive Exposure: Never  Financial Resource Strain: Not on file  Food Insecurity: No Food Insecurity (11/13/2023)   Hunger Vital Sign    Worried About Running Out of Food in the Last Year: Never true    Ran Out of Food in the Last Year: Never true  Transportation Needs: No Transportation Needs (11/13/2023)   PRAPARE - Administrator, Civil Service (Medical): No    Lack of Transportation (Non-Medical): No  Physical Activity: Not on file  Stress: Not on file  Social Connections: Socially Integrated (11/13/2023)   Social Connection and Isolation Panel    Frequency of Communication with Friends and Family: Three times a week    Frequency of Social Gatherings with Friends and Family: Once a week    Attends Religious Services: 1 to 4 times per year    Active Member of [employer] or Organizations: Yes    Attends Banker Meetings: 1 to 4 times per year    Marital Status: Married  Catering Manager Violence: Not At Risk (11/13/2023)   Humiliation, Afraid, Rape, and Kick questionnaire    Fear of Current or Ex-Partner: No    Emotionally Abused: No    Physically Abused: No    Sexually Abused: No  Depression (PHQ2-9): Low Risk (08/24/2024)   Depression (PHQ2-9)    PHQ-2 Score: 1  Alcohol  Screen: Not on file  Housing: Low Risk (11/13/2023)   Housing Stability Vital Sign    Unable to Pay for Housing in the Last Year: No    Number of Times Moved in the Last Year: 0    Homeless in  the Last Year: No  Utilities: Not At Risk (11/13/2023)   AHC Utilities    Threatened with loss of utilities: No  Health Literacy: Not on file   Family History  Problem Relation Age of Onset   Stroke Other    Heart disease Other    BP (!) 162/62   Pulse (!) 51  Ht 5' 6 (1.676 m)   Wt 86.9 kg (191 lb 9.6 oz)   SpO2 93%   BMI 30.93 kg/m   Wt Readings from Last 3 Encounters:  10/29/24 86.9 kg (191 lb 9.6 oz)  10/19/24 87.2 kg (192 lb 3.2 oz)  10/01/24 83.2 kg (183 lb 6.4 oz)   PHYSICAL EXAM: General:  elderly appearing.  No respiratory difficulty. Walked into clinic.  Neck: JVD ~9 cm.  Cor: Regular rate & irregular rhythm. No murmurs. Lungs: clear Extremities: +2 BLE edema  Neuro: alert & oriented x 3. Affect pleasant.   ASSESSMENT & PLAN: 1. 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 RHC/LHC 7/23 showed preserved cardiac output with severe pulmonary hypertension. 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  PA pressure was still high, but is significantly lower than prior RHC.  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 Echo in 1/25 showed EF 65-70%, moderate RV enlargement with moderate RV dysfunction, severe biatrial enlargement, mild mitral stenosis with mean gradient 4.7 mmHg, moderate TR, mild aortic stenosis with mean gradient 11 mmHg, PASP 76 mmHg.  - NYHA IIIa, functional class confounded by deconditioning.  Remains volume overloaded but ReDS improving, weight unchanged in clinic? Reports down trending at home. ReDs 58%> 46% today.  - Use Furoscix  + 40 KCL daily x 3 days, hold torsemide  while using Furoscix . (Has 10 at home) - After 3 days, resume torsemide  at higher dose 80 mg daily + 10 KCL daily. BMET and BNP today.  - May need to consider weekly metolazone .  - Continue Farxiga  10 mg daily.  - Off olmesartan with worsening SCr. - May need to consider repeat echo if not improving at follow-up  - Avoids salt and fluid intake <2L. Encouraged to wear compression socks but does have some difficulty getting them on. Does not qualify for UNNA boots at this time but would be a great candidate for them.   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  Some improvement on 8/23 RHC still with severe mixed PAH/PVH with PVR down to 3.4 WU.  He has never smoked and has no known lung disease.  He has been on warfarin for atrial fibrillation long-term. No known rheumatologic illness.  Not a drinker though he has some signs of cirrhosis by prior liver imaging.  V/Q scan in 7/23 negative for chronic PEs and HRCT chest without definite ILD. Serologic workup showed anti-SCL 70 neg, anti-centromere Ab neg, RF neg, ANA with reflex positive. Suspicion for component of group 1 PH (mixed group 1 and 2).  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   - Diuretics as above. - Continue Opsumit  10 mg daily. - Continue tadalafil  40 mg daily. - Has OSA but unable to tolerate CPAP.   - Participating in rehab at [facility].   3. Atrial fibrillation: Permanent.   - Currently holding warfarin with epistaxis  episode that they called EMS for on Tuesday. Will check INR and CBC today and forward to Dr. Loreli who manages it. Advised not to hold  unless indicated by provider. Epistaxis resolved with Afrin.    - Bradycardic, does not need nodal blocker. Asymptomatic  4. DM2: Continue SGLT2i  5. Carotid stenosis: CTA neck in 6/23 showed high grade bilateral ICA stenoses. He has had awake L TCAR 9/23 and R TCAR 10/23.  - He is on warfarin long-term for AF.   See above.  - Continue statin, goal LDL < 55.  Good lipids in 2/25.   6. HTN: BP elevated, patient states he has white coat syndrome. Home BP labile - Continue hydralazine  as above, may need to increase after diuresis. - Continue to check BP at home. - Off olmesartan in setting of CKD  7. CKD stage IIIb: Last SCr 2.6 (10/01/24) - Continue SGLT2i. BMET today.  8. Aortic stenosis: Mild on 1/25 echo.  9. Mitral stenosis: Mild on 1/25 echo.   Follow up in 1 week with [facility].   Beckey LITTIE Coe AGACNP-BC  10/29/2024 "

## 2024-10-23 NOTE — Telephone Encounter (Signed)
 Patients wife called to question if they could repeat furoscix  x 3 days.   Reports great response with first treatment of 3 doses   Home weight 178 Previously at 192  No SOB Mild edema noted, not as bad as before  still puffy   Wife attempting to keep him out of hospital pending 1/8 OV

## 2024-10-26 ENCOUNTER — Telehealth (HOSPITAL_COMMUNITY): Payer: Self-pay

## 2024-10-26 ENCOUNTER — Encounter (HOSPITAL_COMMUNITY)

## 2024-10-26 NOTE — Telephone Encounter (Signed)
 Pt's wife aware.

## 2024-10-26 NOTE — Telephone Encounter (Signed)
 Called to check in on Robert Baldwin due to increased fluid and missing appointments. HE stated that he is feeling better and has an appointment on Thursday 10/29/24 with his doctor. He is planning on coming back next week if all goes well.

## 2024-10-28 ENCOUNTER — Telehealth (HOSPITAL_COMMUNITY): Payer: Self-pay

## 2024-10-28 ENCOUNTER — Encounter (HOSPITAL_COMMUNITY)

## 2024-10-28 NOTE — Telephone Encounter (Signed)
 Called to confirm/remind patient of their appointment at the [facility] on 10/28/24.   Appointment:   [] Confirmed  [x] Left mess   [] No answer/No voice mail  [] VM Full/unable to leave message  [] Phone not in service  Patient reminded to bring all medications and/or complete list.  Confirmed patient has transportation. Gave directions, instructed to utilize valet parking.

## 2024-10-29 ENCOUNTER — Ambulatory Visit (HOSPITAL_COMMUNITY)
Admission: RE | Admit: 2024-10-29 | Discharge: 2024-10-29 | Disposition: A | Discharge status: Home / Self Care | Source: Ambulatory Visit | Attending: Internal Medicine | Admitting: Internal Medicine

## 2024-10-29 ENCOUNTER — Encounter (HOSPITAL_COMMUNITY): Payer: Self-pay

## 2024-10-29 ENCOUNTER — Ambulatory Visit (HOSPITAL_COMMUNITY): Payer: Self-pay | Admitting: Internal Medicine

## 2024-10-29 VITALS — BP 162/62 | HR 51 | Ht 66.0 in | Wt 191.6 lb

## 2024-10-29 DIAGNOSIS — N1832 Chronic kidney disease, stage 3b: Secondary | ICD-10-CM | POA: Diagnosis not present

## 2024-10-29 DIAGNOSIS — I050 Rheumatic mitral stenosis: Secondary | ICD-10-CM

## 2024-10-29 DIAGNOSIS — R040 Epistaxis: Secondary | ICD-10-CM | POA: Insufficient documentation

## 2024-10-29 DIAGNOSIS — E875 Hyperkalemia: Secondary | ICD-10-CM | POA: Insufficient documentation

## 2024-10-29 DIAGNOSIS — G4733 Obstructive sleep apnea (adult) (pediatric): Secondary | ICD-10-CM | POA: Diagnosis not present

## 2024-10-29 DIAGNOSIS — E8770 Fluid overload, unspecified: Secondary | ICD-10-CM | POA: Diagnosis not present

## 2024-10-29 DIAGNOSIS — E785 Hyperlipidemia, unspecified: Secondary | ICD-10-CM | POA: Diagnosis not present

## 2024-10-29 DIAGNOSIS — Z7984 Long term (current) use of oral hypoglycemic drugs: Secondary | ICD-10-CM | POA: Diagnosis not present

## 2024-10-29 DIAGNOSIS — I5032 Chronic diastolic (congestive) heart failure: Secondary | ICD-10-CM | POA: Diagnosis present

## 2024-10-29 DIAGNOSIS — E1159 Type 2 diabetes mellitus with other circulatory complications: Secondary | ICD-10-CM

## 2024-10-29 DIAGNOSIS — J189 Pneumonia, unspecified organism: Secondary | ICD-10-CM | POA: Insufficient documentation

## 2024-10-29 DIAGNOSIS — R7989 Other specified abnormal findings of blood chemistry: Secondary | ICD-10-CM | POA: Diagnosis not present

## 2024-10-29 DIAGNOSIS — I272 Pulmonary hypertension, unspecified: Secondary | ICD-10-CM | POA: Insufficient documentation

## 2024-10-29 DIAGNOSIS — I080 Rheumatic disorders of both mitral and aortic valves: Secondary | ICD-10-CM | POA: Insufficient documentation

## 2024-10-29 DIAGNOSIS — I350 Nonrheumatic aortic (valve) stenosis: Secondary | ICD-10-CM

## 2024-10-29 DIAGNOSIS — I10 Essential (primary) hypertension: Secondary | ICD-10-CM

## 2024-10-29 DIAGNOSIS — Z7982 Long term (current) use of aspirin: Secondary | ICD-10-CM | POA: Diagnosis not present

## 2024-10-29 DIAGNOSIS — I130 Hypertensive heart and chronic kidney disease with heart failure and stage 1 through stage 4 chronic kidney disease, or unspecified chronic kidney disease: Secondary | ICD-10-CM | POA: Diagnosis present

## 2024-10-29 DIAGNOSIS — I519 Heart disease, unspecified: Secondary | ICD-10-CM | POA: Insufficient documentation

## 2024-10-29 DIAGNOSIS — Z7901 Long term (current) use of anticoagulants: Secondary | ICD-10-CM | POA: Insufficient documentation

## 2024-10-29 DIAGNOSIS — Z79899 Other long term (current) drug therapy: Secondary | ICD-10-CM | POA: Diagnosis not present

## 2024-10-29 DIAGNOSIS — I4821 Permanent atrial fibrillation: Secondary | ICD-10-CM | POA: Insufficient documentation

## 2024-10-29 DIAGNOSIS — E1122 Type 2 diabetes mellitus with diabetic chronic kidney disease: Secondary | ICD-10-CM | POA: Insufficient documentation

## 2024-10-29 DIAGNOSIS — I6523 Occlusion and stenosis of bilateral carotid arteries: Secondary | ICD-10-CM | POA: Diagnosis not present

## 2024-10-29 DIAGNOSIS — I2721 Secondary pulmonary arterial hypertension: Secondary | ICD-10-CM | POA: Diagnosis not present

## 2024-10-29 DIAGNOSIS — I5033 Acute on chronic diastolic (congestive) heart failure: Secondary | ICD-10-CM | POA: Insufficient documentation

## 2024-10-29 DIAGNOSIS — I50813 Acute on chronic right heart failure: Secondary | ICD-10-CM

## 2024-10-29 LAB — CBC
HCT: 33.5 % — ABNORMAL LOW (ref 39.0–52.0)
Hemoglobin: 10.2 g/dL — ABNORMAL LOW (ref 13.0–17.0)
MCH: 26.6 pg (ref 26.0–34.0)
MCHC: 30.4 g/dL (ref 30.0–36.0)
MCV: 87.5 fL (ref 80.0–100.0)
Platelets: 170 K/uL (ref 150–400)
RBC: 3.83 MIL/uL — ABNORMAL LOW (ref 4.22–5.81)
RDW: 16.8 % — ABNORMAL HIGH (ref 11.5–15.5)
WBC: 7.1 K/uL (ref 4.0–10.5)
nRBC: 0 % (ref 0.0–0.2)

## 2024-10-29 LAB — BASIC METABOLIC PANEL WITH GFR
Anion gap: 9 (ref 5–15)
BUN: 83 mg/dL — ABNORMAL HIGH (ref 8–23)
CO2: 31 mmol/L (ref 22–32)
Calcium: 8.9 mg/dL (ref 8.9–10.3)
Chloride: 99 mmol/L (ref 98–111)
Creatinine, Ser: 2.09 mg/dL — ABNORMAL HIGH (ref 0.61–1.24)
GFR, Estimated: 31 mL/min — ABNORMAL LOW
Glucose, Bld: 133 mg/dL — ABNORMAL HIGH (ref 70–99)
Potassium: 4.3 mmol/L (ref 3.5–5.1)
Sodium: 139 mmol/L (ref 135–145)

## 2024-10-29 LAB — PROTIME-INR
INR: 2 — ABNORMAL HIGH (ref 0.8–1.2)
Prothrombin Time: 23.3 s — ABNORMAL HIGH (ref 11.4–15.2)

## 2024-10-29 LAB — PRO BRAIN NATRIURETIC PEPTIDE: Pro Brain Natriuretic Peptide: 3452 pg/mL — ABNORMAL HIGH

## 2024-10-29 MED ORDER — TORSEMIDE 20 MG PO TABS: 80.0000 mg | ORAL_TABLET | Freq: Every day | ORAL | 1 refills | Status: DC

## 2024-10-29 NOTE — Progress Notes (Signed)
"   ReDS Vest / Clip - 10/29/24 1300       ReDS Vest / Clip   Station Marker D    Ruler Value 32    ReDS Value Range High volume overload    ReDS Actual Value 46          "

## 2024-10-29 NOTE — Patient Instructions (Addendum)
 CHANGE Torsemide  to 80 mg Twice daily  Use Furoscix  only AS DIRECTED by our office  Dosing Directions:   Day 1= 10/30/24 use 1 kit with an extra 40 mEq of potassium  Day 2= 10/31/24 use 1 kit with an extra 40 mEq of potassium  Day 3= 11/01/24 use 1 kit with an extra 40 mEq of potassium  HOLD YOUR TORSEMIDE  FOR THE NEXT 3 DAYS, RESTART ON MONDAY 11/02/24  Labs done today, your results will be available in MyChart, we will contact you for abnormal readings.  Your physician recommends that you schedule a follow-up appointment in: as scheduled.  If you have any questions or concerns before your next appointment please send us  a message through [place] or call our office at [phone].    TO LEAVE A MESSAGE FOR THE NURSE SELECT OPTION 2, PLEASE LEAVE A MESSAGE INCLUDING: YOUR NAME DATE OF BIRTH CALL BACK NUMBER REASON FOR CALL**this is important as we prioritize the call backs  YOU WILL RECEIVE A CALL BACK THE SAME DAY AS LONG AS YOU CALL BEFORE 4:00 PM  At the [facility], you and your health needs are our priority. As part of our continuing mission to provide you with exceptional heart care, we have created designated Provider Care Teams. These Care Teams include your primary Cardiologist (physician) and Advanced Practice Providers (APPs- Physician Assistants and Nurse Practitioners) who all work together to provide you with the care you need, when you need it.   You may see any of the following providers on your designated Care Team at your next follow up: Dr Toribio Fuel Dr Ezra Shuck Dr. Morene Brownie Greig Mosses, NP Caffie Shed, [place] [facility] [place], [place] Beckey Coe, NP Jordan Lee, NP Ellouise Class, NP Tinnie Redman, PharmD Jaun Bash, PharmD   Please be sure to bring in all your medications bottles to every appointment.    Thank you for choosing [facility]

## 2024-10-30 NOTE — Telephone Encounter (Signed)
 Pt wife aware

## 2024-11-02 ENCOUNTER — Telehealth (HOSPITAL_COMMUNITY): Payer: Self-pay

## 2024-11-02 ENCOUNTER — Encounter (HOSPITAL_COMMUNITY)

## 2024-11-02 NOTE — Telephone Encounter (Signed)
 Called patient due to missed appoitment. He will not be back until after his doctor appointment coming up this Thursday

## 2024-11-03 ENCOUNTER — Other Ambulatory Visit (HOSPITAL_COMMUNITY): Payer: Self-pay

## 2024-11-04 ENCOUNTER — Encounter (HOSPITAL_COMMUNITY)

## 2024-11-04 NOTE — Progress Notes (Signed)
 "  [facility]  PCP: Maree Isles, MD Primary Cardiologist: Dr. Debera Nephrologist: Dr. Macel HF Cardiologist: Dr. Rolan  HPI: Robert Baldwin is a 84 y.o. male with history of carotid artery stenosis, DM II, HTN, HLD, permanent atrial fibrillation. He follows with Dr. Debera in the cardiology clinic.    Saw [facility] for carotid artery stenosis in 6/23. Was noting more dyspnea with exertion and lower extremity edema for about a month. He was subsequently started on po lasix  20 mg daily.   He was seen by Dr. Debera in the clinic 04/26/22 for evaluation of recent symptoms and pre-operative evaluation in preparation for left CEA followed by staged right CEA. Echo day of the visit showed EF 70-75%, hyperdynamic LV function, mild LVH, interventricular septum flattened in systole and diastole consistent with RV pressure and volume overload, RV moderately enlarged and moderately reduced, RVSP 120 mmHg, moderate LAE, severe RAE, moderate pericardial effusion, severe TR, dilated IVC. Patient was scheduled for outpatient R/LHC to better assess hemodynamics and further characterize pulmonary hypertension.    [facility] 7/23 showed no significant CAD, RA mean 25, PA 115/30 (58), LVEDP 22 mmHg, PCWP not obtained, Fick CO/CI 6.4/3.1, PAPi 3.4. PVR 5.6 WU. 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 He was diuresed with IV lasix . V/Q scan negative for chronic PE. ANA+. All other serologic test -. HRCT with no definite ILD. Started on tadalafil . Diuresed 24 lbs. Hospitalization complicated by right groin hematoma and received 1 unit PRBCs. Discharged home, weight 179 lbs.  Repeat RHC 8/23 showed elevated PCWP and severe mixed pulmonary arterial/venous hypertension, but PA pressures significantly lower than prior RHC. He was instructed to increase torsemide  to 40 mg daily alternating with 20 mg every other day. Spiro and Entresto  held with  hyperkalemia.  Patient had awake left TCAR by Dr. Gretta in 9/23, no complications.  S/p right TCAR 10/23. Tolerated procedure well.   Echo 7/24 showed EF 60-65%, D-shaped interventricular septum suggestive of RV pressure/volume overload, moderate RV enlargement with mildly decreased systolic function, PASP 78 mmHg, mild mitral stenosis mean gradient 4, mild aortic stenosis mean gradient 15.   Follow up 7/24, creatinine elevated 2.35>>3.32. 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 Torsemide  was increased to 40 mg bid.  Patient was admitted in 1/25 with multifocal PNA and volume overload at [facility].  He was diuresed and was treated with antibiotics.  He was sent home on oxygen  but is no longer using it. Echo in 1/25 showed EF 65-70%, moderate RV enlargement with moderate RV dysfunction, severe biatrial enlargement, mild mitral stenosis with mean gradient 4.7 mmHg, moderate TR, mild aortic stenosis with mean gradient 11 mmHg, PASP 76 mmHg.   ~Weekly AHF visits, ReDS clip 58> 46. Diuretics have been adjusted and has done several rounds of furoscix .   Today he returns for AHF follow up with his daughter. Overall feeling ok. Denies palpitations, CP, dizziness, or PND/Orthopnea. Has edema up to his abd. SOB with activity. Appetite ok, ate a tomato biscuit from [employer] this morning. No fever or chills. Weight at home 176 pounds. Taking all medications. Denies ETOH, tobacco or drug use. Drinking <2L fluid a day.   ReDs reading: 41 %, abnormal   6 minute walk (7/24): 259  m 6 minute walk (9/24): 195 m 6 minutes walk (5/25): 243 m 6 minute walk today 08/12/24: 274 m  PMH: 1. HTN 2. Hyperlipidemia 3. Type 2 diabetes 4. CKD stage 3 5. Atrial  fibrillation: Permanent.  6. Carotid stenosis: CTA neck (6/23) with high grade stenosis of the bilateral internal carotid arteries.  - S/p left TCAR 9/23.  - S/p right TCAR 10/23. - Carotid doppler (9/24): LICA stent 50-75% stenosis, RICA stent 50-75% stenosis.  7. 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  - R/LHC (7/23): no significant CAD; RA mean 25, PA 115/30 (58), LVEDP 22 mmHg, PCWP not obtained, Fick CO/CI 6.4/3.1, PAPi 3.4. PVR 5.6 WU. - RHC (8/23): Mean RA 8, PA 85/40 mean 41, mean PCWP 22, CI 2.86, PVR 3.4 WU - Echo (7/24): EF 60-65%, D-shaped interventricular septum suggestive of RV pressure/volume overload, moderate RV enlargement with mildly decreased systolic function, PASP 78 mmHg, mild mitral stenosis mean gradient 4, mild aortic stenosis mean gradient 15.  - RHC (7/24): RA mean 9, PA 78/16 (44), PCWP 17 (prominent v-waves to 31), CO/CI (Fick) 7.63/4.09, PVR 3.54 WU - Echo (1/25): EF 65-70%, moderate RV enlargement with moderate RV dysfunction, severe biatrial enlargement, mild mitral stenosis with mean gradient 4.7 mmHg, moderate TR, mild aortic stenosis with mean gradient 11 mmHg, PASP 76 mmHg.  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  See RHC and echo data above.   - V/Q scan (7/23): No evidence for chronic PE.  - High resolution CT chest (7/23): No definite ILD.  - + ANA -  RHC (7/24): RA mean 9, PA 78/16 (44), PCWP 17 (prominent v-waves to 31), CO/CI (Fick) 7.63/4.09, PVR 3.54 WU  Current Outpatient Medications  Medication Sig Dispense Refill   aspirin  EC 81 MG tablet Take 1 tablet (81 mg total) by mouth daily. Swallow whole.     atorvastatin   (LIPITOR) 40 MG tablet Take 40 mg by mouth at bedtime.     cholecalciferol  (VITAMIN D ) 25 MCG (1000 UNIT) tablet Take 1,000 Units by mouth daily.     FARXIGA  10 MG TABS tablet TAKE 1 TABLET BY MOUTH DAILY 30 tablet 4   fish oil-omega-3 fatty acids  1000 MG capsule Take 1 g by mouth every evening.     hydrALAZINE  (APRESOLINE ) 25 MG tablet Take 2 tablets (50 mg total) by mouth 3 (three) times daily. 200 tablet 3   LOKELMA  10 g PACK packet TAKE 10 GRAM BY MOUTH AS DIRECTED 11 packet 2   macitentan  (OPSUMIT ) 10 MG tablet TAKE 1 TABLET BY MOUTH DAILY 90 tablet 3   Misc Natural Products (GLUCOS-CHONDROIT-MSM COMPLEX) TABS Take 2 tablets by mouth daily.     Multiple Vitamins-Minerals (MULTIVITAMIN WITH MINERALS) tablet Take 1 tablet by mouth in the morning.  Centrum Silver     niacin  500 MG tablet Take 500 mg by mouth in the morning.     potassium chloride  (KLOR-CON  M) 10 MEQ tablet TAKE 1 TABLET BY MOUTH DAILY 90 tablet 3   tadalafil  (CIALIS ) 20 MG tablet TAKE 2 TABLETS BY MOUTH DAILY 180 tablet 3   torsemide  (DEMADEX ) 20 MG tablet Take 4 tablets (80 mg total) by mouth daily. 60 tablet 1   warfarin (COUMADIN ) 5  MG tablet Take 5-7.5 mg by mouth See admin instructions. Take 5 mg on Saturday,Sunday Tues, Wed, Thursday, and then  Take 7.5 mg on Monday,  Wednesday, and Friday     Furosemide  (FUROSCIX ) 80 MG/10ML CTKT Inject 80 mg into the skin as directed. BY THE HEART FAILURE CLINIC ONLY. (Patient not taking: Reported on 11/05/2024) 10 each 1   No current facility-administered medications for this encounter.   Allergies  Allergen Reactions   Other Rash    Bandaids   Social History   Socioeconomic History   Marital status: Married    Spouse name: Tammy   Number of children: Not on file   Years of education: Not on file   Highest education level: Not on file  Occupational History   Occupation: Full time    Comment: Contractor  Tobacco Use   Smoking status: Never    Passive exposure: Never    Smokeless tobacco: Never   Tobacco comments:    Never smoke 05/18/22  Vaping Use   Vaping status: Never Used  Substance and Sexual Activity   Alcohol  use: No    Alcohol /week: 0.0 standard drinks of alcohol    Drug use: No   Sexual activity: Not Currently  Other Topics Concern   Not on file  Social History Narrative   Not on file   Social Drivers of Health   Tobacco Use: Low Risk (11/05/2024)   Patient History    Smoking Tobacco Use: Never    Smokeless Tobacco Use: Never    Passive Exposure: Never  Financial Resource Strain: Not on file  Food Insecurity: No Food Insecurity (11/13/2023)   Hunger Vital Sign    Worried About Running Out of Food in the Last Year: Never true    Ran Out of Food in the Last Year: Never true  Transportation Needs: No Transportation Needs (11/13/2023)   PRAPARE - Administrator, Civil Service (Medical): No    Lack of Transportation (Non-Medical): No  Physical Activity: Not on file  Stress: Not on file  Social Connections: Socially Integrated (11/13/2023)   Social Connection and Isolation Panel    Frequency of Communication with Friends and Family: Three times a week    Frequency of Social Gatherings with Friends and Family: Once a week    Attends Religious Services: 1 to 4 times per year    Active Member of [employer] or Organizations: Yes    Attends Banker Meetings: 1 to 4 times per year    Marital Status: Married  Catering Manager Violence: Not At Risk (11/13/2023)   Humiliation, Afraid, Rape, and Kick questionnaire    Fear of Current or Ex-Partner: No    Emotionally Abused: No    Physically Abused: No    Sexually Abused: No  Depression (PHQ2-9): Low Risk (08/24/2024)   Depression (PHQ2-9)    PHQ-2 Score: 1  Alcohol  Screen: Not on file  Housing: Low Risk (11/13/2023)   Housing Stability Vital Sign    Unable to Pay for Housing in the Last Year: No    Number of Times Moved in the Last Year: 0    Homeless in the Last Year: No   Utilities: Not At Risk (11/13/2023)   AHC Utilities    Threatened with loss of utilities: No  Health Literacy: Not on file   Family History  Problem Relation Age of Onset   Stroke Other    Heart disease Other    BP (!) 150/60   Pulse ROLLEN)  55   Wt 88.1 kg (194 lb 3.2 oz)   SpO2 94%   BMI 31.34 kg/m   Wt Readings from Last 3 Encounters:  11/05/24 88.1 kg (194 lb 3.2 oz)  10/29/24 86.9 kg (191 lb 9.6 oz)  10/19/24 87.2 kg (192 lb 3.2 oz)   PHYSICAL EXAM: General:  chronically ill / elderly appearing.  + conversational dyspnea. + audible exp wheezes Neck: JVD to jaw.  Cor: Regular rate & irregular rhythm. No murmurs. Lungs: clear, diminished bases Extremities: +3 BLE edema to thigh.  Abd: Distended Neuro: alert & oriented x 3. Affect pleasant.   ASSESSMENT & PLAN: 1. 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 RHC/LHC 7/23 showed preserved cardiac output with severe pulmonary hypertension. 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  PA pressure was still high, but is significantly lower than prior RHC.  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 Echo in 1/25 showed EF 65-70%, moderate RV enlargement with moderate RV dysfunction, severe biatrial enlargement, mild mitral stenosis with mean gradient 4.7 mmHg, moderate TR, mild aortic stenosis with mean gradient 11 mmHg, PASP 76 mmHg.  - NYHA IV, functional class confounded by  deconditioning. Remains volume overloaded but ReDS improving? weight trending up and physical exam worse than last week. ReDs 58%> 46%> 41% today.  - Needs admission for IV diuresis. At this point failed escalation of diuretic titration outpatient. See bottom of note for recs.  - Continue Farxiga  10 mg daily.  - Off olmesartan with worsening SCr. - Update echo - Wearing compression socks today, would order UNNA boots once admitted.   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  Some improvement on 8/23 RHC still with severe mixed PAH/PVH with PVR down to 3.4 WU.  He has never smoked and has no known lung disease.  He has been on warfarin for atrial fibrillation long-term. No known rheumatologic illness.  Not a drinker though he has some signs of cirrhosis by prior liver imaging.  V/Q scan in 7/23 negative for chronic PEs and HRCT chest without definite ILD. Serologic workup showed anti-SCL 70 neg, anti-centromere Ab neg, RF neg, ANA with reflex positive. Suspicion for component of group 1 PH (mixed group 1 and 2).  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   - Diuretics as above. - Continue Opsumit  10 mg daily. - Continue tadalafil  40 mg daily. - Has OSA but unable to tolerate CPAP.   - Has completed rehab at [facility].   3. Atrial fibrillation: Permanent.   - Continue warfarin   - Bradycardic, does not need nodal blocker. Asymptomatic  4. DM2: Continue SGLT2i  5. Carotid stenosis: CTA neck in 6/23 showed high grade bilateral ICA stenoses. He has had awake L TCAR 9/23 and R TCAR 10/23.  - He is on warfarin long-term for AF.   See above.  - Continue statin, goal  LDL < 55.  Good lipids in 2/25.   6. HTN: BP elevated, patient states he has white coat syndrome. Home BP labile - Continue  hydralazine  as above, may need to increase after diuresis. - Continue to check BP at home. - Off olmesartan in setting of CKD  7. CKD stage IIIb: Last SCr 2.6 (10/01/24) - Continue SGLT2i. BMET today.  8. Aortic stenosis: Mild on 1/25 echo.  9. Mitral stenosis: Mild on 1/25 echo.   Patient seen with Dr. Zenaida. Will send patient to ED for IV diuresis. Will need lasix  160 IV BID. Follow response, may need brief inotrope support. Please check CMET, P-BNP, lactic acid and Mg. AHF team to consult tomorrow, will need triad admission.   Patient seen with PA/NP, agree with the above note.   Subjective:   Unfortunately patient has failed escalating doses of outpatient diuertics including furosicx. While he reports that he is feeling improved he is audibly wheezing, has shortness of breath at rest and his weight remains 20 pounds over his dry weight.  Discussed with patient, plan for inpatient admission for IV diuretics and close monitoring given significant CKD.  Exam: General: Chronically ill-appearing Lungs: Audible wheezing, mainly upper airway, lung sounds otherwise clear, not consistent with COPD CV: Irregularly irregular, soft systolic murmur, significantly elevated JVP Abdomen: Moderately distended Neurologic: awake/alert, no gross FND.    A/P   Has been on escalating doses of diuretics as an outpatient, limited somewhat by his known CKD. While his ReDS has improved,  he remains volume up, weight unchanged, and has NYHA class IV symptoms.  Suspect given his mixed pulmonary hypertension, he has decompensated RV predominant failure and would recommend admission for IV diuretics and potentially inotropes based on initial response.  Repeat right heart catheterization may be beneficial given his mixed pre-/post capillary pulmonary hypertension and may need PAH medication adjustment.  While admitted, would also benefit from palliative care consultation given significant medical comorbidities.   Would appear that he would be a very poor dialysis candidate if unable to remove fluid through conventional means.  Will plan for admission to the emergency department, heart failure team will consult tomorrow.  I spent 42 minutes caring for this patient today including face to face time, ordering and reviewing labs, discussing admission, seeing the patient, documenting in the record, and arranging follow ups.    Morene Zenaida [facility]    "

## 2024-11-05 ENCOUNTER — Other Ambulatory Visit: Payer: Self-pay

## 2024-11-05 ENCOUNTER — Inpatient Hospital Stay (HOSPITAL_COMMUNITY)
Admission: EM | Admit: 2024-11-05 | Discharge: 2024-11-10 | DRG: 291 | Disposition: A | Discharge status: Home / Self Care | Attending: Internal Medicine | Admitting: Internal Medicine

## 2024-11-05 ENCOUNTER — Ambulatory Visit (HOSPITAL_BASED_OUTPATIENT_CLINIC_OR_DEPARTMENT_OTHER)
Admission: RE | Admit: 2024-11-05 | Discharge: 2024-11-05 | Disposition: A | Discharge status: Home / Self Care | Source: Ambulatory Visit | Attending: Internal Medicine | Admitting: Internal Medicine

## 2024-11-05 ENCOUNTER — Encounter (HOSPITAL_COMMUNITY): Payer: Self-pay

## 2024-11-05 ENCOUNTER — Emergency Department (HOSPITAL_COMMUNITY)

## 2024-11-05 VITALS — BP 150/60 | HR 55 | Wt 194.2 lb

## 2024-11-05 DIAGNOSIS — R9431 Abnormal electrocardiogram [ECG] [EKG]: Secondary | ICD-10-CM

## 2024-11-05 DIAGNOSIS — E1122 Type 2 diabetes mellitus with diabetic chronic kidney disease: Secondary | ICD-10-CM | POA: Diagnosis present

## 2024-11-05 DIAGNOSIS — Z823 Family history of stroke: Secondary | ICD-10-CM

## 2024-11-05 DIAGNOSIS — I2510 Atherosclerotic heart disease of native coronary artery without angina pectoris: Secondary | ICD-10-CM | POA: Diagnosis present

## 2024-11-05 DIAGNOSIS — Z8249 Family history of ischemic heart disease and other diseases of the circulatory system: Secondary | ICD-10-CM

## 2024-11-05 DIAGNOSIS — F32A Depression, unspecified: Secondary | ICD-10-CM | POA: Diagnosis present

## 2024-11-05 DIAGNOSIS — I2722 Pulmonary hypertension due to left heart disease: Secondary | ICD-10-CM | POA: Diagnosis present

## 2024-11-05 DIAGNOSIS — I5043 Acute on chronic combined systolic (congestive) and diastolic (congestive) heart failure: Secondary | ICD-10-CM | POA: Diagnosis present

## 2024-11-05 DIAGNOSIS — I130 Hypertensive heart and chronic kidney disease with heart failure and stage 1 through stage 4 chronic kidney disease, or unspecified chronic kidney disease: Secondary | ICD-10-CM | POA: Diagnosis present

## 2024-11-05 DIAGNOSIS — I2721 Secondary pulmonary arterial hypertension: Secondary | ICD-10-CM | POA: Diagnosis present

## 2024-11-05 DIAGNOSIS — N1832 Chronic kidney disease, stage 3b: Secondary | ICD-10-CM | POA: Diagnosis present

## 2024-11-05 DIAGNOSIS — Z7982 Long term (current) use of aspirin: Secondary | ICD-10-CM | POA: Diagnosis not present

## 2024-11-05 DIAGNOSIS — G4733 Obstructive sleep apnea (adult) (pediatric): Secondary | ICD-10-CM | POA: Diagnosis present

## 2024-11-05 DIAGNOSIS — I2781 Cor pulmonale (chronic): Secondary | ICD-10-CM | POA: Diagnosis present

## 2024-11-05 DIAGNOSIS — I272 Pulmonary hypertension, unspecified: Secondary | ICD-10-CM | POA: Insufficient documentation

## 2024-11-05 DIAGNOSIS — Z79899 Other long term (current) drug therapy: Secondary | ICD-10-CM | POA: Diagnosis not present

## 2024-11-05 DIAGNOSIS — I050 Rheumatic mitral stenosis: Secondary | ICD-10-CM | POA: Diagnosis present

## 2024-11-05 DIAGNOSIS — I5082 Biventricular heart failure: Secondary | ICD-10-CM | POA: Diagnosis present

## 2024-11-05 DIAGNOSIS — I3139 Other pericardial effusion (noninflammatory): Secondary | ICD-10-CM | POA: Diagnosis present

## 2024-11-05 DIAGNOSIS — I5032 Chronic diastolic (congestive) heart failure: Secondary | ICD-10-CM | POA: Diagnosis not present

## 2024-11-05 DIAGNOSIS — J9601 Acute respiratory failure with hypoxia: Secondary | ICD-10-CM | POA: Diagnosis present

## 2024-11-05 DIAGNOSIS — I5021 Acute systolic (congestive) heart failure: Principal | ICD-10-CM

## 2024-11-05 DIAGNOSIS — I5033 Acute on chronic diastolic (congestive) heart failure: Secondary | ICD-10-CM | POA: Diagnosis present

## 2024-11-05 DIAGNOSIS — I4821 Permanent atrial fibrillation: Secondary | ICD-10-CM | POA: Diagnosis present

## 2024-11-05 DIAGNOSIS — R7989 Other specified abnormal findings of blood chemistry: Secondary | ICD-10-CM

## 2024-11-05 DIAGNOSIS — E785 Hyperlipidemia, unspecified: Secondary | ICD-10-CM | POA: Diagnosis present

## 2024-11-05 DIAGNOSIS — N179 Acute kidney failure, unspecified: Secondary | ICD-10-CM | POA: Diagnosis present

## 2024-11-05 DIAGNOSIS — I2489 Other forms of acute ischemic heart disease: Secondary | ICD-10-CM | POA: Diagnosis present

## 2024-11-05 DIAGNOSIS — I342 Nonrheumatic mitral (valve) stenosis: Secondary | ICD-10-CM | POA: Insufficient documentation

## 2024-11-05 DIAGNOSIS — Z7984 Long term (current) use of oral hypoglycemic drugs: Secondary | ICD-10-CM | POA: Insufficient documentation

## 2024-11-05 DIAGNOSIS — I350 Nonrheumatic aortic (valve) stenosis: Secondary | ICD-10-CM | POA: Insufficient documentation

## 2024-11-05 DIAGNOSIS — I5031 Acute diastolic (congestive) heart failure: Secondary | ICD-10-CM | POA: Diagnosis not present

## 2024-11-05 DIAGNOSIS — I071 Rheumatic tricuspid insufficiency: Secondary | ICD-10-CM | POA: Diagnosis present

## 2024-11-05 DIAGNOSIS — D631 Anemia in chronic kidney disease: Secondary | ICD-10-CM | POA: Diagnosis present

## 2024-11-05 DIAGNOSIS — E875 Hyperkalemia: Secondary | ICD-10-CM | POA: Diagnosis present

## 2024-11-05 DIAGNOSIS — Z6831 Body mass index (BMI) 31.0-31.9, adult: Secondary | ICD-10-CM

## 2024-11-05 DIAGNOSIS — I4891 Unspecified atrial fibrillation: Secondary | ICD-10-CM | POA: Diagnosis present

## 2024-11-05 DIAGNOSIS — Z7901 Long term (current) use of anticoagulants: Secondary | ICD-10-CM

## 2024-11-05 DIAGNOSIS — E66811 Obesity, class 1: Secondary | ICD-10-CM | POA: Diagnosis present

## 2024-11-05 LAB — BASIC METABOLIC PANEL WITH GFR
Anion gap: 9 (ref 5–15)
BUN: 66 mg/dL — ABNORMAL HIGH (ref 8–23)
CO2: 33 mmol/L — ABNORMAL HIGH (ref 22–32)
Calcium: 8.9 mg/dL (ref 8.9–10.3)
Chloride: 100 mmol/L (ref 98–111)
Creatinine, Ser: 1.98 mg/dL — ABNORMAL HIGH (ref 0.61–1.24)
GFR, Estimated: 33 mL/min — ABNORMAL LOW
Glucose, Bld: 116 mg/dL — ABNORMAL HIGH (ref 70–99)
Potassium: 4.5 mmol/L (ref 3.5–5.1)
Sodium: 142 mmol/L (ref 135–145)

## 2024-11-05 LAB — TROPONIN T, HIGH SENSITIVITY
Troponin T High Sensitivity: 120 ng/L (ref 0–19)
Troponin T High Sensitivity: 117 ng/L (ref 0–19)

## 2024-11-05 LAB — CBC WITH DIFFERENTIAL/PLATELET
Abs Immature Granulocytes: 0.02 K/uL (ref 0.00–0.07)
Basophils Absolute: 0 K/uL (ref 0.0–0.1)
Basophils Relative: 0 %
Eosinophils Absolute: 0.3 K/uL (ref 0.0–0.5)
Eosinophils Relative: 4 %
HCT: 35.6 % — ABNORMAL LOW (ref 39.0–52.0)
Hemoglobin: 10.7 g/dL — ABNORMAL LOW (ref 13.0–17.0)
Immature Granulocytes: 0 %
Lymphocytes Relative: 13 %
Lymphs Abs: 0.9 K/uL (ref 0.7–4.0)
MCH: 26.8 pg (ref 26.0–34.0)
MCHC: 30.1 g/dL (ref 30.0–36.0)
MCV: 89.2 fL (ref 80.0–100.0)
Monocytes Absolute: 0.9 K/uL (ref 0.1–1.0)
Monocytes Relative: 13 %
Neutro Abs: 4.7 K/uL (ref 1.7–7.7)
Neutrophils Relative %: 70 %
Platelets: 167 K/uL (ref 150–400)
RBC: 3.99 MIL/uL — ABNORMAL LOW (ref 4.22–5.81)
RDW: 17.2 % — ABNORMAL HIGH (ref 11.5–15.5)
WBC: 6.7 K/uL (ref 4.0–10.5)
nRBC: 0 % (ref 0.0–0.2)

## 2024-11-05 LAB — PRO BRAIN NATRIURETIC PEPTIDE: Pro Brain Natriuretic Peptide: 3944 pg/mL — ABNORMAL HIGH

## 2024-11-05 LAB — CBG MONITORING, ED: Glucose-Capillary: 119 mg/dL — ABNORMAL HIGH (ref 70–99)

## 2024-11-05 MED ORDER — WARFARIN SODIUM 7.5 MG PO TABS
7.5000 mg | ORAL_TABLET | ORAL | Status: DC
  Administered 2024-11-06 – 2024-11-09 (×2): 7.5 mg via ORAL
  Filled 2024-11-05 (×3): qty 1

## 2024-11-05 MED ORDER — WARFARIN SODIUM 5 MG PO TABS
5.0000 mg | ORAL_TABLET | ORAL | Status: DC
  Administered 2024-11-07 – 2024-11-08 (×2): 5 mg via ORAL
  Filled 2024-11-05 (×2): qty 1

## 2024-11-05 MED ORDER — WARFARIN - PHARMACIST DOSING INPATIENT: Freq: Every day | Status: DC

## 2024-11-05 MED ORDER — ATORVASTATIN CALCIUM 40 MG PO TABS
40.0000 mg | ORAL_TABLET | Freq: Every day | ORAL | Status: DC
  Administered 2024-11-06 – 2024-11-09 (×5): 40 mg via ORAL
  Filled 2024-11-05 (×5): qty 1

## 2024-11-05 MED ORDER — ACETAMINOPHEN 325 MG PO TABS
650.0000 mg | ORAL_TABLET | Freq: Four times a day (QID) | ORAL | Status: DC | PRN
  Administered 2024-11-06 – 2024-11-10 (×6): 650 mg via ORAL
  Filled 2024-11-05 (×6): qty 2

## 2024-11-05 MED ORDER — ASPIRIN 81 MG PO TBEC
81.0000 mg | DELAYED_RELEASE_TABLET | Freq: Every day | ORAL | Status: DC
  Administered 2024-11-06 – 2024-11-07 (×2): 81 mg via ORAL
  Filled 2024-11-05 (×2): qty 1

## 2024-11-05 MED ORDER — DAPAGLIFLOZIN PROPANEDIOL 10 MG PO TABS
10.0000 mg | ORAL_TABLET | Freq: Every day | ORAL | Status: DC
  Administered 2024-11-06 – 2024-11-10 (×5): 10 mg via ORAL
  Filled 2024-11-05 (×5): qty 1

## 2024-11-05 MED ORDER — ACETAMINOPHEN 650 MG RE SUPP: 650.0000 mg | Freq: Four times a day (QID) | RECTAL | Status: DC | PRN

## 2024-11-05 MED ORDER — WARFARIN SODIUM 5 MG PO TABS
5.0000 mg | ORAL_TABLET | Freq: Once | ORAL | Status: AC
  Administered 2024-11-06: 5 mg via ORAL
  Filled 2024-11-05: qty 1

## 2024-11-05 MED ORDER — FUROSEMIDE 10 MG/ML IJ SOLN
80.0000 mg | Freq: Once | INTRAMUSCULAR | Status: AC
  Administered 2024-11-05: 80 mg via INTRAVENOUS
  Filled 2024-11-05: qty 8

## 2024-11-05 MED ORDER — INSULIN ASPART 100 UNIT/ML IJ SOLN: 0.0000 [IU] | Freq: Every day | INTRAMUSCULAR | Status: DC

## 2024-11-05 MED ORDER — MACITENTAN 10 MG PO TABS
10.0000 mg | ORAL_TABLET | Freq: Every day | ORAL | Status: DC
  Administered 2024-11-06 – 2024-11-10 (×5): 10 mg via ORAL
  Filled 2024-11-05 (×5): qty 1

## 2024-11-05 MED ORDER — TADALAFIL 20 MG PO TABS
40.0000 mg | ORAL_TABLET | Freq: Every day | ORAL | Status: DC
  Administered 2024-11-06 – 2024-11-10 (×5): 40 mg via ORAL
  Filled 2024-11-05 (×6): qty 2

## 2024-11-05 MED ORDER — HYDRALAZINE HCL 50 MG PO TABS
50.0000 mg | ORAL_TABLET | Freq: Three times a day (TID) | ORAL | Status: DC
  Administered 2024-11-06 – 2024-11-10 (×12): 50 mg via ORAL
  Filled 2024-11-05: qty 2
  Filled 2024-11-05 (×11): qty 1
  Filled 2024-11-05: qty 2
  Filled 2024-11-05: qty 1

## 2024-11-05 MED ORDER — INSULIN ASPART 100 UNIT/ML IJ SOLN
0.0000 [IU] | Freq: Three times a day (TID) | INTRAMUSCULAR | Status: DC
  Administered 2024-11-06: 2 [IU] via SUBCUTANEOUS
  Administered 2024-11-06: 1 [IU] via SUBCUTANEOUS
  Administered 2024-11-07: 2 [IU] via SUBCUTANEOUS
  Administered 2024-11-07 – 2024-11-08 (×4): 1 [IU] via SUBCUTANEOUS
  Administered 2024-11-09 (×2): 2 [IU] via SUBCUTANEOUS
  Administered 2024-11-10: 1 [IU] via SUBCUTANEOUS
  Filled 2024-11-05 (×5): qty 1
  Filled 2024-11-05 (×4): qty 2
  Filled 2024-11-05: qty 1

## 2024-11-05 MED ORDER — HYDRALAZINE HCL 20 MG/ML IJ SOLN: 5.0000 mg | Freq: Four times a day (QID) | INTRAMUSCULAR | Status: DC | PRN

## 2024-11-05 NOTE — ED Provider Triage Note (Signed)
 Emergency Medicine Provider Triage Evaluation Note  Robert Baldwin , a 84 y.o. male  was evaluated in triage.  Pt complains of increasing shortness of breath and leg swelling for 3 weeks.  Notes symptoms worse over the past week.  Has been taking Lasix  subcutaneous as well as torsemide  with no improvement.  Has been off the Lasix  for the past 4 days.  Seen at heart failure clinic and sent to ED for potential fluid overload.  History of A-fib on warfarin as well.  Denies chest pain  Review of Systems  Positive: Shortness of breath, leg swelling, wheezing Negative: Headache, dizziness, syncope, chest pain, nausea/vomiting  Physical Exam  BP (!) 173/55 (BP Location: Left Arm)   Pulse (!) 49   Temp 98.2 F (36.8 C) (Oral)   Resp 20   Ht 5' 6 (1.676 m)   Wt 88.1 kg   SpO2 94%   BMI 31.34 kg/m  Gen:   Awake, no distress   Resp:  Normal effort  MSK:   Moves extremities without difficulty  Other:  Audible wheezing  Medical Decision Making  Medically screening exam initiated at 4:35 PM.  Appropriate orders placed.  Cartez SHIQUAN MATHIEU was informed that the remainder of the evaluation will be completed by another provider, this initial triage assessment does not replace that evaluation, and the importance of remaining in the ED until their evaluation is complete.     Neysa Thersia RAMAN, [place] 11/05/24 1636

## 2024-11-05 NOTE — ED Triage Notes (Signed)
 Patient reports exertional shob getting worse over time, wheezing that's been worse over the last month, and bilateral leg swelling that has been worse over the last 3 weeks. Patient is audibly wheezing in triage. Patient has been taking lasix  subcutaneous and torsemide  with no decrease in swelling.

## 2024-11-05 NOTE — Patient Instructions (Signed)
 PLEASE REPORT TO THE ER TO BE EVALUATED   Follow-Up in: TO BE DETERMINED AFTER HOSPITALIZATION   At the [facility], you and your health needs are our priority. We have a designated team specialized in the treatment of Heart Failure. This Care Team includes your primary Heart Failure Specialized Cardiologist (physician), Advanced Practice Providers (APPs- Physician Assistants and Nurse Practitioners), and Pharmacist who all work together to provide you with the care you need, when you need it.   You may see any of the following providers on your designated Care Team at your next follow up:  Dr. Toribio Fuel Dr. Ezra Shuck Dr. Odis Brownie Greig Mosses, NP Caffie Shed, [place] [facility] [place], [place] Beckey Coe, NP Jordan Lee, NP Tinnie Redman, PharmD   Please be sure to bring in all your medications bottles to every appointment.   Need to Contact Us :  If you have any questions or concerns before your next appointment please send us  a message through [place] or call our office at [phone].    TO LEAVE A MESSAGE FOR THE NURSE SELECT OPTION 2, PLEASE LEAVE A MESSAGE INCLUDING: YOUR NAME DATE OF BIRTH CALL BACK NUMBER REASON FOR CALL**this is important as we prioritize the call backs  YOU WILL RECEIVE A CALL BACK THE SAME DAY AS LONG AS YOU CALL BEFORE 4:00 PM

## 2024-11-05 NOTE — ED Provider Notes (Signed)
 " [facility] Provider Note   CSN: [id number] Arrival date & time: 11/05/24  1559     Patient presents with: Leg Swelling and Shortness of Breath   Robert Baldwin is a 84 y.o. male.   84 yo M with a chief complaint of leg swelling and difficulty breathing.  Tells me has been going on for a while now has been getting a little bit better at home but saw his cardiologist today and thought that he would benefit from coming into the hospital for IV diuresis.  He denies any chest pain or pressure.  Denies cough congestion or fever.   Shortness of Breath      Prior to Admission medications  Medication Sig Start Date End Date Taking? Authorizing Provider  aspirin  EC 81 MG tablet Take 1 tablet (81 mg total) by mouth daily. Swallow whole. 04/14/24   Bethanie Cough, PA-C  atorvastatin  (LIPITOR) 40 MG tablet Take 40 mg by mouth at bedtime.    [provider]  cholecalciferol  (VITAMIN D ) 25 MCG (1000 UNIT) tablet Take 1,000 Units by mouth daily.    [provider]  FARXIGA  10 MG TABS tablet TAKE 1 TABLET BY MOUTH DAILY 06/30/24   McLean, Dalton S, MD  fish oil-omega-3 fatty acids  1000 MG capsule Take 1 g by mouth every evening.    [provider]  Furosemide  (FUROSCIX ) 80 MG/10ML CTKT Inject 80 mg into the skin as directed. BY THE HE[facility] FAILURE CLINIC ONLY. Patient not taking: Reported on 11/05/2024 10/19/24   Glena Harlene HERO, FNP  hydrALAZINE  (APRESOLINE ) 25 MG tablet Take 2 tablets (50 mg total) by mouth 3 (three) times daily. 10/01/24   Colletta Manuelita Garre, PA-C  LOKELMA  10 g PACK packet TAKE 10 GRAM BY MOUTH AS DIRECTED 05/27/24   Rolan Ezra RAMAN, MD  macitentan  (OPSUMIT ) 10 MG tablet TAKE 1 TABLET BY MOUTH DAILY 06/02/24   McLean, Dalton S, MD  Misc Natural Products (GLUCOS-CHONDROIT-MSM COMPLEX) TABS Take 2 tablets by mouth daily.    [provider]  Multiple Vitamins-Minerals (MULTIVITAMIN WITH MINERALS) tablet  Take 1 tablet by mouth in the morning.  Centrum Silver    [provider]  niacin  500 MG tablet Take 500 mg by mouth in the morning.    [provider]  potassium chloride  (KLOR-CON  M) 10 MEQ tablet TAKE 1 TABLET BY MOUTH DAILY 06/30/24   Rolan Ezra RAMAN, MD  tadalafil  (CIALIS ) 20 MG tablet TAKE 2 TABLETS BY MOUTH DAILY 06/01/24   Rolan Ezra RAMAN, MD  torsemide  (DEMADEX ) 20 MG tablet Take 4 tablets (80 mg total) by mouth daily. 10/29/24   Hayes Beckey CROME, NP  warfarin (COUMADIN ) 5 MG tablet Take 5-7.5 mg by mouth See admin instructions. Take 5 mg on Saturday,Sunday Tues, Wed, Thursday, and then  Take 7.5 mg on Monday,  Wednesday, and Friday    [provider]    Allergies: Other    Review of Systems  Respiratory:  Positive for shortness of breath.     Updated Vital Signs BP (!) 191/56   Pulse (!) 45   Temp 98.1 F (36.7 C) (Oral)   Resp 20   Ht 5' 6 (1.676 m)   Wt 88.1 kg   SpO2 94%   BMI 31.34 kg/m   Physical Exam Vitals and nursing note reviewed.  Constitutional:      Appearance: He is well-developed.  HENT:     Head: Normocephalic and atraumatic.  Eyes:     Pupils: Pupils are equal, round, and reactive to light.  Neck:     Vascular: No JVD.  Cardiovascular:     Rate and Rhythm: Normal rate and regular rhythm.     Heart sounds: No murmur heard.    No friction rub. No gallop.  Pulmonary:     Effort: No respiratory distress.     Breath sounds: Examination of the right-lower field reveals rales. Examination of the left-lower field reveals rales. Rales present. No wheezing.  Abdominal:     General: There is no distension.     Tenderness: There is no abdominal tenderness. There is no guarding or rebound.  Musculoskeletal:        General: Normal range of motion.     Cervical back: Normal range of motion and neck supple.     Right lower leg: Edema present.     Left lower leg: Edema present.     Comments: Compression stockings on.  Skin:     Coloration: Skin is not pale.     Findings: No rash.  Neurological:     Mental Status: He is alert and oriented to person, place, and time.  Psychiatric:        Behavior: Behavior normal.     (all labs ordered are listed, but only abnormal results are displayed) Labs Reviewed  BASIC METABOLIC PANEL WITH GFR - Abnormal; Notable for the following components:      Result Value   CO2 33 (*)    Glucose, Bld 116 (*)    BUN 66 (*)    Creatinine, Ser 1.98 (*)    GFR, Estimated 33 (*)    All other components within normal limits  PRO BRAIN NATRIURETIC PEPTIDE - Abnormal; Notable for the following components:   Pro Brain Natriuretic Peptide 3,944.0 (*)    All other components within normal limits  CBC WITH DIFFERENTIAL/PLATELET - Abnormal; Notable for the following components:   RBC 3.99 (*)    Hemoglobin 10.7 (*)    HCT 35.6 (*)    RDW 17.2 (*)    All other components within normal limits  TROPONIN T, HIGH SENSITIVITY - Abnormal; Notable for the following components:   Troponin T High Sensitivity 120 (*)    All other components within normal limits  TROPONIN T, HIGH SENSITIVITY    EKG: None  Radiology: DG Chest 2 View Result Date: 11/05/2024 CLINICAL DATA:  Worsening exertional shortness of breath, wheezing, lower extremity edema EXAM: CHEST - 2 VIEW COMPARISON:  11/15/2023 FINDINGS: Frontal and lateral views of the chest demonstrate persistent enlargement of the cardiac silhouette. There is mild central pulmonary vascular congestion without overt edema. No effusion or pneumothorax. No acute bony abnormalities. IMPRESSION: 1. Stable enlargement of the cardiac silhouette. 2. Pulmonary vascular congestion without overt edema. Electronically Signed   By: Ozell Daring M.D.   On: 11/05/2024 17:23     .Critical Care  Performed by: Emil Share, DO Authorized by: Emil Share, DO   Critical care provider statement:    Critical care time (minutes):  35   Critical care time was exclusive  of:  Separately billable procedures and treating other patients   Critical care was time spent personally by me on the following activities:  Development of treatment plan with patient or surrogate, discussions with consultants, evaluation of patient's response to treatment, examination of patient, ordering and review of laboratory studies, ordering and review of radiographic studies, ordering and performing treatments and interventions, pulse oximetry,  re-evaluation of patient's condition and review of old charts   Care discussed with: admitting provider      Medications Ordered in the ED  furosemide  (LASIX ) injection 80 mg (has no administration in time range)                                    Medical Decision Making Risk Prescription drug management.   84 yo M with a chief complaint of leg swelling and difficulty breathing.  Was sent from cardiology clinic for admission.  I discussed this with cardiology, Dr. Debera.  Recommends medical admission cards team to see in the morning.  Troponin 120.  BNP almost 4000.  X-ray on my independent interpretation consistent with edema.  The patients results and plan were reviewed and discussed.   Any x-rays performed were independently reviewed by myself.   Differential diagnosis were considered with the presenting HPI.  Medications  furosemide  (LASIX ) injection 80 mg (has no administration in time range)    Vitals:   11/05/24 1605 11/05/24 1626 11/05/24 1915 11/05/24 1922  BP: (!) 173/55  (!) 191/56   Pulse: (!) 49  (!) 45   Resp: 20  20   Temp: 98.2 F (36.8 C)   98.1 F (36.7 C)  TempSrc: Oral   Oral  SpO2: 94%  94%   Weight:  88.1 kg    Height:  5' 6 (1.676 m)      Final diagnoses:  Acute systolic congestive heart failure (HCC)    Admission/ observation were discussed with the admitting physician, patient and/or family and they are comfortable with the plan.       Final diagnoses:  Acute systolic congestive heart  failure [facility])    ED Discharge Orders     None          Emil Share, DO 11/05/24 1936  "

## 2024-11-05 NOTE — Progress Notes (Signed)
 PHARMACY - ANTICOAGULATION CONSULT NOTE  Pharmacy Consult for warfarin Indication: atrial fibrillation  Allergies[1]  Patient Measurements: Height: 5' 6 (167.6 cm) Weight: 88.1 kg (194 lb 3.2 oz) IBW/kg (Calculated) : 63.8 HEPARIN  DW (KG): 82.3  Vital Signs: Temp: 98.1 F (36.7 C) (01/15 1922) Temp Source: Oral (01/15 1922) BP: 175/49 (01/15 2045) Pulse Rate: 45 (01/15 2045)  Labs: Recent Labs    11/05/24 1652  HGB 10.7*  HCT 35.6*  PLT 167  CREATININE 1.98*    Estimated Creatinine Clearance: 29.4 mL/min (A) (by C-G formula based on SCr of 1.98 mg/dL (H)).   Medical History: Past Medical History:  Diagnosis Date   (HFpEF) heart failure with preserved ejection fraction (HCC)    Arthritis    Atrial fibrillation (HCC)    Bradycardia    Carotid artery disease    Hyperlipidemia    Hypertension    Pulmonary hypertension (HCC)    Type 2 diabetes mellitus (HCC)     Medications:  -Warfarin 7.5mg  PO on Monday, Friday and 5mg  PO all other days (verified with wife)  Assessment: 65 yoM presented with shortness of breath. Pharmacy consulted to dose warfarin while inpatient for atrial fibrillation  -Hgb 10s, plts WNL -Last dose: 1/14 PM  Goal of Therapy:  INR 2-3 Monitor platelets by anticoagulation protocol: Yes   Plan:  -Give 5mg  PO x1 -Continue PTA warfarin regimen of 7.5mg  PO on Monday, Friday and 5mg  PO all other days -INR daily  Lynwood Poplar, PharmD, BCPS Clinical Pharmacist 11/05/2024 10:45 PM        [1]  Allergies Allergen Reactions   Other Rash    Bandaids

## 2024-11-05 NOTE — TOC CM/SW Note (Signed)
 TOC consult received for d/c planning needs. Follow-up to be completed with patient as appropriate.   Merilee Batty, MSN, RN Case Management [phone]

## 2024-11-05 NOTE — Consult Note (Addendum)
 "   Advanced Heart Failure Team Consult Note    Primary Physician: Maree Isles, MD Cardiologist:  Jayson Sierras, MD AHF: Dr. Rolan  HPI:    Robert Baldwin is seen today for evaluation of acute on chronic RV failure at the request of Dr. Dino, Internal Medicine.   Robert Baldwin is a 84 y.o. male with carotid artery stenosis, DM II, HTN, HLD, permanent atrial fibrillation. He follows with Dr. Sierras in the cardiology clinic.    Saw [facility] for carotid artery stenosis in 6/23. Was noting more dyspnea with exertion and lower extremity edema for about a month. He was subsequently started on po lasix  20 mg daily.   He was seen by Dr. Sierras in the clinic 04/26/22 for evaluation of recent symptoms and pre-operative evaluation in preparation for left CEA followed by staged right CEA. Echo day of the visit showed EF 70-75%, hyperdynamic LV function, mild LVH, interventricular septum flattened in systole and diastole consistent with RV pressure and volume overload, RV moderately enlarged and moderately reduced, RVSP 120 mmHg, moderate LAE, severe RAE, moderate pericardial effusion, severe TR, dilated IVC. Patient was scheduled for outpatient R/LHC to better assess hemodynamics and further characterize pulmonary hypertension.    [facility] 7/23 showed no significant CAD, RA mean 25, PA 115/30 (58), LVEDP 22 mmHg, PCWP not obtained, Fick CO/CI 6.4/3.1, PAPi 3.4. PVR 5.6 WU. 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 He was diuresed with IV lasix . V/Q scan negative for chronic PE. ANA+. All other serologic test -. HRCT with no definite ILD. Diuresed 24 lbs. Started on tadalafil . Hospitalization complicated by right groin hematoma and received 1 unit PRBCs. Discharged home, weight 179 lbs.   Repeat RHC 8/23 showed elevated PCWP and severe mixed pulmonary arterial/venous hypertension, but PA pressures significantly lower than prior RHC. He was instructed to  increase torsemide  to 40 mg daily alternating with 20 mg every other day. Spiro and Entresto  held with hyperkalemia.   Patient had left TCAR by Dr. Gretta in 9/23, no complications.  S/p right TCAR 10/23. Tolerated procedure well.    Echo 7/24 showed EF 60-65%, D-shaped interventricular septum suggestive of RV pressure/volume overload, moderate RV enlargement with mildly decreased systolic function, PASP 78 mmHg, mild mitral stenosis mean gradient 4, mild aortic stenosis mean gradient 15.    Follow up 7/24, creatinine elevated 2.35>>3.32. 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 Torsemide  was increased to 40 mg bid.   Admitted in 1/25 with multifocal PNA and volume overload at [facility]  He was diuresed and was treated with antibiotics.  He was sent home on oxygen  but is no longer using it. Echo in 1/25 showed EF 65-70%, moderate RV enlargement with moderate RV dysfunction, severe biatrial enlargement, mild mitral stenosis with mean gradient 4.7 mmHg, moderate TR, mild aortic stenosis with mean gradient 11 mmHg, PASP 76 mmHg.    Weekly AHF visits, ReDS clip 58> 46. Diuretics have been adjusted and has done several rounds of furoscix . Again, presented with volume overload yesterday 11/06/23 and was sent to the ED for admission for IV diuresis. ProBNP 3900. Hs trop 120>>117. SCr 1.92  (most recent b/l ~2-2.5).   He reports good initial response to IV Lasix  and subjective improvement in leg  edema and less wheezing today. No current resting dyspnea.    Home Medications Prior to Admission medications  Medication Sig Start Date End Date Taking? Authorizing Provider  aspirin  EC 81 MG tablet Take 1 tablet (81 mg total) by mouth daily. Swallow whole. 04/14/24    Bethanie Cough, PA-C  atorvastatin  (LIPITOR) 40 MG tablet Take 40 mg by mouth at bedtime.    [provider]  cholecalciferol  (VITAMIN D ) 25 MCG (1000 UNIT) tablet Take 1,000 Units by mouth daily.    [provider]  FARXIGA  10 MG TABS tablet TAKE 1 TABLET BY MOUTH DAILY 06/30/24   Maylen Waltermire S, MD  fish oil-omega-3 fatty acids  1000 MG capsule Take 1 g by mouth every evening.    [provider]  Furosemide  (FUROSCIX ) 80 MG/10ML CTKT Inject 80 mg into the skin as directed. BY THE HEART FAILURE CLINIC ONLY. Patient not taking: Reported on 11/05/2024 10/19/24   Glena Harlene HERO, FNP  hydrALAZINE  (APRESOLINE ) 25 MG tablet Take 2 tablets (50 mg total) by mouth 3 (three) times daily. 10/01/24   Colletta Manuelita Garre, PA-C  LOKELMA  10 g PACK packet TAKE 10 GRAM BY MOUTH AS DIRECTED 05/27/24   Rolan Ezra RAMAN, MD  macitentan  (OPSUMIT ) 10 MG tablet TAKE 1 TABLET BY MOUTH DAILY 06/02/24   Leinani Lisbon S, MD  Misc Natural Products (GLUCOS-CHONDROIT-MSM COMPLEX) TABS Take 2 tablets by mouth daily.    [provider]  Multiple Vitamins-Minerals (MULTIVITAMIN WITH MINERALS) tablet Take 1 tablet by mouth in the morning.  Centrum Silver    [provider]  niacin  500 MG tablet Take 500 mg by mouth in the morning.    [provider]  potassium chloride  (KLOR-CON  M) 10 MEQ tablet TAKE 1 TABLET BY MOUTH DAILY 06/30/24   Rolan Ezra RAMAN, MD  tadalafil  (CIALIS ) 20 MG tablet TAKE 2 TABLETS BY MOUTH DAILY 06/01/24   Reginald Mangels S, MD  torsemide  (DEMADEX ) 20 MG tablet Take 4 tablets (80 mg total) by mouth daily. 10/29/24   Hayes Beckey CROME, NP  warfarin (COUMADIN ) 5 MG tablet Take 5-7.5 mg by mouth See admin instructions. Take 5 mg on Saturday,Sunday Tues, Wed, Thursday, and then  Take 7.5 mg on Monday,  Wednesday, and Friday    [provider]    Past Medical History: Past Medical History:  Diagnosis Date   (HFpEF) heart failure with preserved ejection  fraction (HCC)    Arthritis    Atrial fibrillation (HCC)    Bradycardia    Carotid artery disease    Hyperlipidemia    Hypertension    Pulmonary hypertension (HCC)    Type 2 diabetes mellitus (HCC)     Past Surgical History: Past Surgical History:  Procedure Laterality Date   Cataract surgery     COLONOSCOPY     HERNIA REPAIR     RIGHT HEART CATH N/A 05/28/2022   Procedure: RIGHT HEART CATH;  Surgeon: Rolan Ezra RAMAN, MD;  Location: [facility] INVASIVE CV LAB;  Service: Cardiovascular;  Laterality: N/A;   RIGHT HEART CATH N/A 05/21/2023   Procedure: RIGHT HEART CATH;  Surgeon: Rolan Ezra RAMAN, MD;  Location: [facility] INVASIVE CV LAB;  Service: Cardiovascular;  Laterality: N/A;   RIGHT/LEFT HEART CATH AND CORONARY ANGIOGRAPHY N/A 04/30/2022   Procedure: RIGHT/LEFT HEART CATH AND CORONARY ANGIOGRAPHY;  Surgeon: Mady Bruckner, MD;  Location: [facility] INVASIVE CV LAB;  Service: Cardiovascular;  Laterality: N/A;   TONSILLECTOMY     TRANSCAROTID ARTERY REVASCULARIZATION  Left 07/02/2022  Procedure: Transcarotid Artery Revascularization;  Surgeon: Gretta Lonni PARAS, MD;  Location: [facility] OR;  Service: Vascular;  Laterality: Left;   TRANSCAROTID ARTERY REVASCULARIZATION  Right 08/20/2022   Procedure: Right Transcarotid Artery Revascularization;  Surgeon: Gretta Lonni PARAS, MD;  Location: [facility] OR;  Service: Vascular;  Laterality: Right;   ULTRASOUND GUIDANCE FOR VASCULAR ACCESS Right 07/02/2022   Procedure: ULTRASOUND GUIDANCE FOR VASCULAR ACCESS;  Surgeon: Gretta Lonni PARAS, MD;  Location: [facility] OR;  Service: Vascular;  Laterality: Right;   ULTRASOUND GUIDANCE FOR VASCULAR ACCESS Left 08/20/2022   Procedure: ULTRASOUND GUIDANCE FOR VASCULAR ACCESS;  Surgeon: Gretta Lonni PARAS, MD;  Location: [facility] OR;  Service: Vascular;  Laterality: Left;   YAG LASER APPLICATION Right 08/17/2016   Procedure: YAG LASER APPLICATION;  Surgeon: Dow JULIANNA Burke, MD;  Location: [facility] ORS;  Service: Ophthalmology;  Laterality:  Right;    Family History: Family History  Problem Relation Age of Onset   Stroke Other    Heart disease Other     Social History: Social History   Socioeconomic History   Marital status: Married    Spouse name: Tammy   Number of children: Not on file   Years of education: Not on file   Highest education level: Not on file  Occupational History   Occupation: Full time    Comment: Contractor  Tobacco Use   Smoking status: Never    Passive exposure: Never   Smokeless tobacco: Never   Tobacco comments:    Never smoke 05/18/22  Vaping Use   Vaping status: Never Used  Substance and Sexual Activity   Alcohol  use: No    Alcohol /week: 0.0 standard drinks of alcohol    Drug use: No   Sexual activity: Not Currently  Other Topics Concern   Not on file  Social History Narrative   Not on file   Social Drivers of Health   Tobacco Use: Low Risk (11/05/2024)   Patient History    Smoking Tobacco Use: Never    Smokeless Tobacco Use: Never    Passive Exposure: Never  Financial Resource Strain: Not on file  Food Insecurity: No Food Insecurity (11/13/2023)   Hunger Vital Sign    Worried About Running Out of Food in the Last Year: Never true    Ran Out of Food in the Last Year: Never true  Transportation Needs: No Transportation Needs (11/13/2023)   PRAPARE - Administrator, Civil Service (Medical): No    Lack of Transportation (Non-Medical): No  Physical Activity: Not on file  Stress: Not on file  Social Connections: Socially Integrated (11/13/2023)   Social Connection and Isolation Panel    Frequency of Communication with Friends and Family: Three times a week    Frequency of Social Gatherings with Friends and Family: Once a week    Attends Religious Services: 1 to 4 times per year    Active Member of [employer] or Organizations: Yes    Attends Banker Meetings: 1 to 4 times per year    Marital Status: Married  Depression (PHQ2-9): Low Risk (08/24/2024)    Depression (PHQ2-9)    PHQ-2 Score: 1  Alcohol  Screen: Not on file  Housing: Low Risk (11/13/2023)   Housing Stability Vital Sign    Unable to Pay for Housing in the Last Year: No    Number of Times Moved in the Last Year: 0    Homeless in the Last Year: No  Utilities: Not At Risk (11/13/2023)   [facility] Utilities  Threatened with loss of utilities: No  Health Literacy: Not on file   Allergies:  Allergies[1]  Objective:    Vital Signs:   Temp:  [97.4 F (36.3 C)-98.2 F (36.8 C)] 97.7 F (36.5 C) (01/16 0959) Pulse Rate:  [40-50] 44 (01/16 0845) Resp:  [11-25] 25 (01/16 0845) BP: (138-191)/(27-74) 138/27 (01/16 0845) SpO2:  [90 %-97 %] 95 % (01/16 0845) Weight:  [88.1 kg] 88.1 kg (01/15 1626)   Weight change: Filed Weights   11/05/24 1626  Weight: 88.1 kg   Intake/Output:   Intake/Output Summary (Last 24 hours) at 11/06/2024 1114 Last data filed at 11/06/2024 0406 Gross per 24 hour  Intake --  Output 500 ml  Net -500 ml     Physical Exam   General:  elderly male, NAD  Cor: Irregularly irregular rhythm, slow rate .No murmurs. JVD elevated to ear + HJR   Lungs: diminished at bases, faint diffuse wheezing  Extremities: 2+ b/l LE pitting edema up to knees  Telemetry   Afib w/ SVR, mid 40s   EKG    Afib w/ PVCs 85 bpm, personally reviewed   Labs   Basic Metabolic Panel: Recent Labs  Lab 11/05/24 1652 11/06/24 0336 11/06/24 0346  NA 142  --  140  K 4.5  --  4.0  CL 100  --  100  CO2 33*  --  31  GLUCOSE 116*  --  111*  BUN 66*  --  62*  CREATININE 1.98*  --  1.92*  CALCIUM  8.9  --  8.7*  MG  --  2.6*  --     Liver Function Tests: Recent Labs  Lab 11/06/24 0346  AST 35  ALT 25  ALKPHOS 76  BILITOT 0.6  PROT 6.5  ALBUMIN  3.7   No results for input(s): LIPASE, AMYLASE in the last 168 hours. No results for input(s): AMMONIA in the last 168 hours.  CBC: Recent Labs  Lab 11/05/24 1652 11/06/24 0346  WBC 6.7 6.6  NEUTROABS 4.7  --    HGB 10.7* 9.9*  HCT 35.6* 32.1*  MCV 89.2 87.0  PLT 167 168    Cardiac Enzymes: No results for input(s): CKTOTAL, CKMB, CKMBINDEX, TROPONINI in the last 168 hours.  BNP (last 3 results) Recent Labs    11/15/23 1810 12/18/23 0910 03/12/24 0917  BNP 586.0* 656.3* 642.6*   ProBNP (last 3 results) Recent Labs    10/19/24 1017 10/29/24 1442 11/05/24 1652  PROBNP 3,897.0* 3,452.0* 3,944.0*   Medications:    Current Medications:  aspirin  EC  81 mg Oral Daily   atorvastatin   40 mg Oral QHS   dapagliflozin  propanediol  10 mg Oral Daily   hydrALAZINE   50 mg Oral TID   insulin  aspart  0-5 Units Subcutaneous QHS   insulin  aspart  0-9 Units Subcutaneous TID WC   macitentan   10 mg Oral Daily   tadalafil   40 mg Oral Daily   warfarin  7.5 mg Oral Once per day on Monday Friday   And   [START ON 11/07/2024] warfarin  5 mg Oral Once per day on Sunday Tuesday Wednesday Thursday Saturday   Warfarin - Pharmacist Dosing Inpatient   Does not apply q1600    Infusions:   Assessment/Plan   1. 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 RHC/LHC 7/23 showed preserved cardiac output with severe pulmonary hypertension. 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  PA pressure was still high, but is significantly lower than prior RHC.  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 Echo in 1/25 showed EF 65-70%, moderate RV  enlargement with moderate RV dysfunction, severe biatrial enlargement, mild mitral stenosis with mean gradient 4.7 mmHg, moderate TR, mild aortic stenosis with mean gradient 11 mmHg, PASP 76 mmHg.  - NYHA IV on admit. Functional class confounded by deconditioning.  - Significantly volume up. - Continue IV Lasix  80 mg bid  - place UNNA boots - fluid restrict  - Continue Farxiga  10 mg daily.  - Off olmesartan with worsening SCr. - Repeat echo - Palliative Care consult   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  Some improvement on 8/23 RHC still with severe mixed PAH/PVH with PVR down to 3.4 WU.  He has never smoked and has no known lung disease.  He has been on warfarin for atrial fibrillation long-term. No known rheumatologic illness.  Not a drinker though he has some signs of cirrhosis by prior liver imaging.  V/Q scan in 7/23 negative for chronic PEs and HRCT chest without definite ILD. Serologic workup showed anti-SCL 70 neg, anti-centromere Ab neg, RF neg, ANA with reflex positive. Suspicion for component of group 1 PH (mixed group 1 and 2).  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   - Diuretics as above. - Continue Opsumit  10 mg daily. - Continue tadalafil  40 mg daily. - Has OSA but unable to tolerate CPAP.   - Has completed rehab at [facility].    3. Atrial fibrillation: Permanent.   - Continue warfarin, INR 2.0    - Bradycardic, does not need nodal blocker. Asymptomatic   4. DM2: Continue SGLT2i   5. Carotid stenosis: CTA neck in 6/23 showed high grade bilateral ICA stenoses. He has had awake L TCAR 9/23 and R TCAR 10/23.  - He is on warfarin long-term for AF.   See above.  - Continue statin, goal LDL < 55.  Good lipids in 2/25.    6. HTN: BP elevated, patient states he has  white coat syndrome. Home BP labile - Continue hydralazine  as above, may need to increase after diuresis. - Off olmesartan in setting of CKD   7. CKD stage IIIb: B/l SCr 2-2.5 - SCr 1.9 today, follow w/ diuresis  - Continue SGLT2i.  8. Aortic stenosis: Mild on 1/25 echo.   9. Mitral stenosis: Mild on 1/25 echo.   Length of Stay: 1  Brittainy Simmons, PA-C  11/06/2024, 11:14 AM  Advanced Heart Failure Team Pager [phone] (M-F; 7a - 5p)   Please visit [email or web address]: For overnight coverage please call cardiology fellow first. If fellow not available call Shock/ECMO MD on call.  For ECMO / Mechanical Support (Impella, IABP, LVAD) issues call Shock / ECMO MD on call.   Patient seen with PA, I formulated the plan and agree with the above note.   Patient with history as noted above, RV failure  with severe pulmonary hypertension and also diastolic LV dysfunction. He has been seen frequently recently in HF clinic for volume overload.  He was seen again in clinic yesterday and admitted due to intractable volume overload.   He feels better today after initial diuresis.   General: NAD Neck: JVP 14 cm, no thyromegaly or thyroid nodule.  Lungs: Occasional wheezes.  CV: Nondisplaced PMI.  Heart irregular S1/S2, no S3/S4, 2/6 SEM RUSB.  2+ edema to knees.  No carotid bruit.  Difficult to palpate pedal pulses.  Abdomen: Soft, nontender, no hepatosplenomegaly, no distention.  Skin: Intact without lesions or rashes.  Neurologic: Alert and oriented x 3.  Psych: Normal affect. Extremities: No clubbing or cyanosis.  HEENT: Normal.   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  He has been on selective pulmonary vasodilators at home long-term (Opsumit  and tadalafil ).  Patient is significantly volume overloaded.  Creatinine is 1.92, actually lower than recent baseline.  - Lasix  80 mg IV bid and will give a dose of metolazone  this  evening, follow response.  - Continue Opsumit  and tadalafil .  - Continue dapagliflozin .  - May need RHC, will follow response. If creatinine worsens and he diureses poorly over the weekend, consider PICC placement.  - Unna boots.   AF is permanent, continue warfarin.   Ezra Shuck 11/06/2024 1:46 PM      [1]  Allergies Allergen Reactions   Other Rash    Bandaids   "

## 2024-11-05 NOTE — ED Triage Notes (Signed)
 Pt from heart failure clinic.C/O [facility] for unknown time. Denies CP. C/O bilateral lower extremity swelling and abd swelling. Axox4.

## 2024-11-05 NOTE — H&P (Signed)
 " History and Physical    ATTHEW Baldwin FMW:[id number] DOB: 01/26/1941 DOA: 11/05/2024  PCP: Maree Isles, MD  Patient coming from: Home  Chief Complaint: Shortness of breath  HPI: Robert Baldwin is a 84 y.o. 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  He was sent to the ED by cardiology for IV diuresis due to concern for volume overload.  Patient is reporting 3-week history of mild dyspnea on exertion, orthopnea, and bilateral lower extremity edema.  Denies fevers, cough, or chest pain.  He is taking all of his home medications including torsemide  80 mg daily.  No other complaints.  ED Course: Bradycardic with heart rate in the 40s.  Hypertensive with SBP as high as 190s.  SpO2 94% on room air on arrival to the ED.  Afebrile.  Labs showing no leukocytosis, hemoglobin 10.7 (stable), creatinine 1.98 (stable), proBNP 3944, troponin 120> 117.  Chest x-ray showing pulmonary vascular congestion without overt edema.  Patient was given IV Lasix  80 mg.  EDP discussed the case with cardiology and they felt that troponin elevation was likely due to demand ischemia from decompensated heart failure rather than ACS.  Advanced heart failure team will consult tomorrow.  Review of Systems:  Review of Systems  All other systems reviewed and are negative.   Past Medical History:  Diagnosis Date   (HFpEF) heart failure with preserved ejection fraction (HCC)    Arthritis    Atrial fibrillation (HCC)    Bradycardia    Carotid artery disease    Hyperlipidemia    Hypertension    Pulmonary hypertension (HCC)    Type 2 diabetes mellitus (HCC)     Past Surgical History:   Procedure Laterality Date   Cataract surgery     COLONOSCOPY     HERNIA REPAIR     RIGHT HEART CATH N/A 05/28/2022   Procedure: RIGHT HEART CATH;  Surgeon: Rolan Ezra RAMAN, MD;  Location: [facility] INVASIVE CV LAB;  Service: Cardiovascular;  Laterality: N/A;   RIGHT HEART CATH N/A 05/21/2023   Procedure: RIGHT HEART CATH;  Surgeon: Rolan Ezra RAMAN, MD;  Location: [facility] INVASIVE CV LAB;  Service: Cardiovascular;  Laterality: N/A;   RIGHT/LEFT HEART CATH AND CORONARY ANGIOGRAPHY N/A 04/30/2022   Procedure: RIGHT/LEFT HEART CATH AND CORONARY ANGIOGRAPHY;  Surgeon: Mady Bruckner, MD;  Location: [facility] INVASIVE CV LAB;  Service: Cardiovascular;  Laterality: N/A;   TONSILLECTOMY     TRANSCAROTID ARTERY REVASCULARIZATION  Left 07/02/2022   Procedure: Transcarotid Artery Revascularization;  Surgeon: Gretta Bruckner PARAS, MD;  Location: [facility] OR;  Service: Vascular;  Laterality: Left;   TRANSCAROTID ARTERY REVASCULARIZATION  Right 08/20/2022   Procedure: Right Transcarotid Artery Revascularization;  Surgeon: Gretta Bruckner PARAS, MD;  Location: [facility] OR;  Service: Vascular;  Laterality: Right;   ULTRASOUND GUIDANCE FOR VASCULAR ACCESS Right 07/02/2022   Procedure: ULTRASOUND GUIDANCE FOR VASCULAR ACCESS;  Surgeon: Gretta Bruckner PARAS, MD;  Location: [facility] OR;  Service: Vascular;  Laterality: Right;   ULTRASOUND GUIDANCE FOR VASCULAR ACCESS Left 08/20/2022   Procedure: ULTRASOUND GUIDANCE FOR VASCULAR ACCESS;  Surgeon: Gretta Bruckner PARAS, MD;  Location: [facility] OR;  Service: Vascular;  Laterality: Left;   YAG LASER APPLICATION Right 08/17/2016   Procedure: YAG LASER APPLICATION;  Surgeon: Dow JULIANNA Burke, MD;  Location: [facility] ORS;  Service: Ophthalmology;  Laterality: Right;     reports that he has never smoked. He has never been exposed to tobacco smoke. He has never used smokeless tobacco. He reports that he does not drink alcohol  and does not use drugs.  Allergies[1]  Family History  Problem Relation Age of Onset    Stroke Other    Heart disease Other     Prior to Admission medications  Medication Sig Start Date End Date Taking? Authorizing Provider  aspirin  EC 81 MG tablet Take 1 tablet (81 mg total) by mouth daily. Swallow whole. 04/14/24   Bethanie Cough, PA-C  atorvastatin  (LIPITOR) 40 MG tablet Take 40 mg by mouth at bedtime.    [provider]  cholecalciferol  (VITAMIN D ) 25 MCG (1000 UNIT) tablet Take 1,000 Units by mouth daily.    [provider]  FARXIGA  10 MG TABS tablet TAKE 1 TABLET BY MOUTH DAILY 06/30/24   McLean, Dalton S, MD  fish oil-omega-3 fatty acids  1000 MG capsule Take 1 g by mouth every evening.    [provider]  Furosemide  (FUROSCIX ) 80 MG/10ML CTKT Inject 80 mg into the skin as directed. BY THE HEART FAILURE CLINIC ONLY. Patient not taking: Reported on 11/05/2024 10/19/24   Glena Harlene HERO, FNP  hydrALAZINE  (APRESOLINE ) 25 MG tablet Take 2 tablets (50 mg total) by mouth 3 (three) times daily. 10/01/24   Colletta Manuelita Garre, PA-C  LOKELMA  10 g PACK packet TAKE 10 GRAM BY MOUTH AS DIRECTED 05/27/24   Rolan Ezra RAMAN, MD  macitentan  (OPSUMIT ) 10 MG tablet TAKE 1 TABLET BY MOUTH DAILY 06/02/24   McLean, Dalton S, MD  Misc Natural Products (GLUCOS-CHONDROIT-MSM COMPLEX) TABS Take 2 tablets by mouth daily.    [provider]  Multiple Vitamins-Minerals (MULTIVITAMIN WITH MINERALS) tablet Take 1 tablet by mouth in the morning.  Centrum Silver    [provider]  niacin  500 MG tablet Take 500 mg by mouth in the morning.    [provider]  potassium chloride  (KLOR-CON  M) 10 MEQ tablet TAKE 1 TABLET BY MOUTH DAILY 06/30/24   Rolan Ezra RAMAN, MD  tadalafil  (CIALIS ) 20 MG tablet TAKE 2 TABLETS BY MOUTH DAILY 06/01/24   Rolan Ezra RAMAN, MD  torsemide  (DEMADEX ) 20 MG tablet Take 4 tablets (80 mg total) by mouth daily. 10/29/24   Hayes Beckey CROME, NP  warfarin (COUMADIN ) 5 MG tablet Take 5-7.5 mg by mouth See admin instructions. Take 5 mg on  Saturday,Sunday Tues, Wed, Thursday, and then  Take 7.5 mg on Monday,  Wednesday, and Friday    [provider]    Physical Exam: Vitals:   11/05/24 1915 11/05/24 1922 11/05/24 2015 11/05/24 2045  BP: (!) 191/56  (!) 171/74 (!) 175/49  Pulse: (!) 45  (!) 48 (!) 45  Resp: 20  13 14   Temp:  98.1 F (36.7 C)    TempSrc:  Oral    SpO2: 94%  96% 94%  Weight:      Height:        Physical Exam Vitals reviewed.  Constitutional:      General: He is not in acute distress. HENT:     Head: Normocephalic and atraumatic.  Eyes:     Extraocular Movements: Extraocular movements intact.  Neck:     Comments: +JVD Cardiovascular:     Rate and Rhythm: Normal rate and regular rhythm.     Heart sounds: Murmur heard.  Pulmonary:     Effort: Pulmonary effort is normal. No respiratory distress.     Breath sounds: Rales present.  Abdominal:     General: Bowel sounds are normal.     Palpations: Abdomen is soft.     Tenderness: There is no abdominal tenderness. There is no guarding.  Musculoskeletal:     Cervical back: Normal range of motion.     Right lower leg: Edema present.     Left lower leg: Edema present.     Comments: 2-3+ pitting edema of bilateral lower extremities  Skin:    General: Skin is warm and dry.  Neurological:     General: No focal deficit present.     Mental Status: He is alert and oriented to person, place, and time.     Labs on Admission: I have personally reviewed following labs and imaging studies  CBC: Recent Labs  Lab 11/05/24 1652  WBC 6.7  NEUTROABS 4.7  HGB 10.7*  HCT 35.6*  MCV 89.2  PLT 167   Basic Metabolic Panel: Recent Labs  Lab 11/05/24 1652  NA 142  K 4.5  CL 100  CO2 33*  GLUCOSE 116*  BUN 66*  CREATININE 1.98*  CALCIUM  8.9   GFR: Estimated Creatinine Clearance: 29.4 mL/min (A) (by C-G formula based on SCr of 1.98 mg/dL (H)). Liver Function Tests: No results for input(s): AST, ALT, ALKPHOS, BILITOT, PROT,  ALBUMIN  in the last 168 hours. No results for input(s): LIPASE, AMYLASE in the last 168 hours. No results for input(s): AMMONIA in the last 168 hours. Coagulation Profile: No results for input(s): INR, PROTIME in the last 168 hours. Cardiac Enzymes: No results for input(s): CKTOTAL, CKMB, CKMBINDEX, TROPONINI in the last 168 hours. BNP (last 3 results) Recent Labs    10/19/24 1017 10/29/24 1442 11/05/24 1652  PROBNP 3,897.0* 3,452.0* 3,944.0*   HbA1C: No results for input(s): HGBA1C in the last 72 hours. CBG: No results for input(s): GLUCAP in the last 168 hours. Lipid Profile: No results for input(s): CHOL, HDL, LDLCALC, TRIG, CHOLHDL, LDLDIRECT in the last 72 hours. Thyroid Function Tests: No results for input(s): TSH, T4TOTAL, FREET4, T3FREE, THYROIDAB in the last 72 hours. Anemia Panel: No results for input(s): VITAMINB12, FOLATE, FERRITIN, TIBC, IRON, RETICCTPCT in the last 72 hours. Urine analysis:    Component Value Date/Time   COLORURINE YELLOW 11/13/2023 1210   APPEARANCEUR HAZY (A) 11/13/2023 1210   LABSPEC 1.010 11/13/2023 1210   PHURINE 5.0 11/13/2023 1210   GLUCOSEU >=500 (A) 11/13/2023 1210   HGBUR SMALL (A) 11/13/2023 1210   BILIRUBINUR NEGATIVE 11/13/2023 1210   KETONESUR NEGATIVE 11/13/2023 1210   PROTEINUR 30 (A) 11/13/2023 1210   NITRITE NEGATIVE 11/13/2023 1210   LEUKOCYTESUR NEGATIVE 11/13/2023 1210    Radiological Exams on Admission: DG Chest 2 View Result Date: 11/05/2024 CLINICAL DATA:  Worsening exertional shortness of breath, wheezing, lower extremity edema EXAM: CHEST - 2 VIEW COMPARISON:  11/15/2023 FINDINGS: Frontal and lateral views of the chest demonstrate persistent enlargement of the cardiac silhouette. There is mild central pulmonary vascular congestion without overt edema. No effusion or pneumothorax. No acute bony abnormalities. IMPRESSION: 1. Stable enlargement of the cardiac  silhouette. 2. Pulmonary vascular congestion without overt edema. Electronically Signed   By: Ozell Daring M.D.   On: 11/05/2024 17:23    EKG: Independently reviewed.  Poor quality study with baseline wander, A-fib, QTc 676.  Assessment and Plan  Acute on chronic HFpEF with RV failure Echo done a year  ago showing EF 65 to 70%, RV systolic function moderately reduced, severely elevated pulmonary artery systolic pressure, severe biatrial dilation, mild mitral stenosis, mild aortic stenosis, and moderate tricuspid regurgitation.  Patient is presenting with complaints of dyspnea on exertion, orthopnea, and bilateral lower extremity edema.  Appears volume overloaded on exam.  proBNP 3944.  Chest x-ray showing pulmonary vascular congestion without overt edema.  Not hypoxic.  Cardiology consulted and patient was given IV Lasix  80 mg in the ED.  Further diuresis per cardiology recommendation.  Continue Farxiga .  Repeat echocardiogram ordered.  Monitor intake and output, daily weights, low-sodium diet with fluid restriction, monitor renal function and electrolytes.  Elevated troponin History of CAD Troponin 120> 117.  Cardiology feels elevated troponin is likely due to demand ischemia from decompensated heart failure rather than ACS.  Patient is not endorsing chest pain.  Continue aspirin  and statin.  Pulmonary hypertension Diuretic as above.  Continue macitentan  and tadalafil .  QT prolongation Potassium is within normal range.  Check magnesium  level and replace if low.  Avoid QT prolonging drugs and follow-up repeat EKG in the morning.  Permanent A-fib with slow ventricular response Heart rate in the 40s and not on any AV nodal blocking agents.  INR 2.0 on labs a week ago, continue to monitor.  Coumadin  dosing per pharmacy.  Type 2 diabetes Hemoglobin A1c 7.4 on labs a year ago, repeat ordered.  Continue Farxiga .  Placed on sensitive sliding scale insulin  ACHS.   Hypertension Most recent SBP in  the 170s.  Diuretic as above.  Continue home p.o. hydralazine .  IV hydralazine  PRN SBP >180.  CKD stage IIIb Creatinine stable, monitor renal function.   Hyperlipidemia Continue Lipitor.  Chronic anemia Hemoglobin stable.  DVT prophylaxis: Coumadin  Code Status: Full Code (discussed with the patient) Level of care: Progressive Care Unit Admission status: It is my clinical opinion that admission to INPATIENT is reasonable and necessary because of the expectation that this patient will require hospital care that crosses at least 2 midnights to treat this condition based on the medical complexity of the problems presented.  Given the aforementioned information, the predictability of an adverse outcome is felt to be significant.  Editha Ram MD [facility]  If 7PM-7AM, please contact night-coverage [email or web address]  11/05/2024, 9:16 PM       [1]  Allergies Allergen Reactions   Other Rash    Bandaids   "

## 2024-11-05 NOTE — Progress Notes (Signed)
"   ReDS Vest / Clip - 11/05/24 1500       ReDS Vest / Clip   Station Marker D    Ruler Value 32.5    ReDS Value Range High volume overload    ReDS Actual Value 41          "

## 2024-11-06 ENCOUNTER — Inpatient Hospital Stay (HOSPITAL_COMMUNITY)

## 2024-11-06 DIAGNOSIS — I5033 Acute on chronic diastolic (congestive) heart failure: Secondary | ICD-10-CM | POA: Diagnosis not present

## 2024-11-06 DIAGNOSIS — I5031 Acute diastolic (congestive) heart failure: Secondary | ICD-10-CM

## 2024-11-06 LAB — COMPREHENSIVE METABOLIC PANEL WITH GFR
ALT: 25 U/L (ref 0–44)
AST: 35 U/L (ref 15–41)
Albumin: 3.7 g/dL (ref 3.5–5.0)
Alkaline Phosphatase: 76 U/L (ref 38–126)
Anion gap: 9 (ref 5–15)
BUN: 62 mg/dL — ABNORMAL HIGH (ref 8–23)
CO2: 31 mmol/L (ref 22–32)
Calcium: 8.7 mg/dL — ABNORMAL LOW (ref 8.9–10.3)
Chloride: 100 mmol/L (ref 98–111)
Creatinine, Ser: 1.92 mg/dL — ABNORMAL HIGH (ref 0.61–1.24)
GFR, Estimated: 34 mL/min — ABNORMAL LOW
Glucose, Bld: 111 mg/dL — ABNORMAL HIGH (ref 70–99)
Potassium: 4 mmol/L (ref 3.5–5.1)
Sodium: 140 mmol/L (ref 135–145)
Total Bilirubin: 0.6 mg/dL (ref 0.0–1.2)
Total Protein: 6.5 g/dL (ref 6.5–8.1)

## 2024-11-06 LAB — CBC
HCT: 32.1 % — ABNORMAL LOW (ref 39.0–52.0)
Hemoglobin: 9.9 g/dL — ABNORMAL LOW (ref 13.0–17.0)
MCH: 26.8 pg (ref 26.0–34.0)
MCHC: 30.8 g/dL (ref 30.0–36.0)
MCV: 87 fL (ref 80.0–100.0)
Platelets: 168 K/uL (ref 150–400)
RBC: 3.69 MIL/uL — ABNORMAL LOW (ref 4.22–5.81)
RDW: 17.2 % — ABNORMAL HIGH (ref 11.5–15.5)
WBC: 6.6 K/uL (ref 4.0–10.5)
nRBC: 0 % (ref 0.0–0.2)

## 2024-11-06 LAB — GLUCOSE, CAPILLARY
Glucose-Capillary: 153 mg/dL — ABNORMAL HIGH (ref 70–99)
Glucose-Capillary: 167 mg/dL — ABNORMAL HIGH (ref 70–99)

## 2024-11-06 LAB — HEMOGLOBIN A1C
Hgb A1c MFr Bld: 6.5 % — ABNORMAL HIGH (ref 4.8–5.6)
Mean Plasma Glucose: 139.85 mg/dL

## 2024-11-06 LAB — ECHOCARDIOGRAM COMPLETE
Height: 66 in
Weight: 3107.22 [oz_av]

## 2024-11-06 LAB — CBG MONITORING, ED
Glucose-Capillary: 97 mg/dL (ref 70–99)
Glucose-Capillary: 135 mg/dL — ABNORMAL HIGH (ref 70–99)

## 2024-11-06 LAB — PROTIME-INR
INR: 2 — ABNORMAL HIGH (ref 0.8–1.2)
Prothrombin Time: 24 s — ABNORMAL HIGH (ref 11.4–15.2)

## 2024-11-06 LAB — MAGNESIUM: Magnesium: 2.6 mg/dL — ABNORMAL HIGH (ref 1.7–2.4)

## 2024-11-06 MED ORDER — FUROSEMIDE 10 MG/ML IJ SOLN
80.0000 mg | Freq: Two times a day (BID) | INTRAMUSCULAR | Status: DC
  Administered 2024-11-06 – 2024-11-08 (×6): 80 mg via INTRAVENOUS
  Filled 2024-11-06 (×6): qty 8

## 2024-11-06 MED ORDER — METOLAZONE 2.5 MG PO TABS
2.5000 mg | ORAL_TABLET | Freq: Once | ORAL | Status: AC
  Administered 2024-11-06: 2.5 mg via ORAL
  Filled 2024-11-06 (×2): qty 1

## 2024-11-06 MED ORDER — POTASSIUM CHLORIDE CRYS ER 20 MEQ PO TBCR
40.0000 meq | EXTENDED_RELEASE_TABLET | Freq: Once | ORAL | Status: AC
  Administered 2024-11-06: 40 meq via ORAL
  Filled 2024-11-06: qty 2

## 2024-11-06 NOTE — Progress Notes (Signed)
 Heart Failure Navigator Progress Note  Assessed for Heart & Vascular TOC clinic readiness.  Patient does not meet criteria due to advanced heart failure team has been consulted. Will sign off.   Duwaine Plant, PharmD, BCPS Heart Failure Stewardship Pharmacist Phone [phone]

## 2024-11-06 NOTE — Progress Notes (Signed)
 Orthopedic Tech Progress Note Patient Details:  Robert Baldwin 12/18/1940 [id number]  Patient ID: [name], male   DOB: Nov 27, 1940, 84 y.o.   MRN: [id number] Order received for unna boot application.  Will wait for pt to be admitted to a room and apply so nursing will not have to remove with admission to the floor for skin assessment.  Reach out to ortho tech if pt is discharging or will not be admitted to a room [phone]. Thanks  Shastina Rua OTR/L 11/06/2024   11/06/2024, 1:13 PM

## 2024-11-06 NOTE — Progress Notes (Addendum)
 " Progress Note   Patient: Robert Baldwin FMW:[id number] DOB: 04/26/1941 DOA: 11/05/2024     1 DOS: the patient was seen and examined on 11/06/2024   Brief hospital course:  84 y.o. 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  He was sent to the ED by cardiology for IV diuresis due to concern for volume overload.  Heart failure team is on board On IV lasix  TTE: 65-70%, severe TR   Assessment and Plan:  Acute on chronic HFpEF with RV failure, POA: NYHA IV  Appears volume overloaded on exam.  proBNP 3944.   Chest x-ray showing pulmonary vascular congestion without overt edema.  Cardiology consulted and patient was given IV Lasix  80 mg in the ED.     Continue Farxiga  10 mg daily. Monitor intake and output, daily weights, low-sodium diet with fluid restriction, monitor renal function and electrolytes. TTE done on 1/16 showed EF of 65-70%, RV size is moderately enlarged, moderate pericardial effusion, severe TR Continue IV lasix  80 mg BID with close monitoring of renal function and electrolytes. Off olmesartan because of renal dysfunction. Not a candidate for beta blockers in the setting of bradycardia   Elevated troponin History of CAD Troponin 120> 117.   likely due to demand ischemia from decompensated heart failure rather than ACS.  Patient is not endorsing chest pain.  Continue aspirin  and statin.   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  Diuretic as above.  Continue macitentan  and tadalafil .   QT prolongation Potassium is within normal range.  Check magnesium  level and replace if low.  Avoid QT prolonging drugs and follow-up repeat EKG in the morning.   Permanent A-fib with slow ventricular response Heart rate in the 40s and not on any AV nodal blocking agents.  INR 2.0 on labs a week ago, continue to monitor.  Coumadin  dosing per pharmacy.   Type 2 diabetes Hemoglobin A1c 7.4 on labs a year ago, repeat ordered.  Continue Farxiga .  Placed on sensitive sliding scale insulin  ACHS.   Hypertension Most recent SBP in the 170s.  Diuretic as above.  Continue home p.o. hydralazine .  IV hydralazine  PRN SBP >180.   CKD stage IIIb Creatinine stable, monitor renal function.   Hyperlipidemia Continue Lipitor.  Class I obesity,POA: Likely a candidate for GLP-1 RA as an outpatient.   Chronic anemia Hemoglobin stable.  Disposition: Lives at home with his wife.     Subjective: No acute events overnight. Shortness of breath has improved. Cardiology on board.  Physical Exam: Vitals:   11/06/24 0610 11/06/24 0845 11/06/24 0959 11/06/24 1130  BP:  (!) 138/27  (!) 171/48  Pulse: (!) 43 (!) 44  (!) 46  Resp: 15 (!) 25  18  Temp:   97.7 F (36.5 C)   TempSrc:   Oral   SpO2: 95% 95%  95%  Weight:      Height:       Constitutional: NAD, calm, comfortable Eyes: PERRL, lids and conjunctivae normal ENMT: Mucous membranes are moist. Posterior pharynx  clear of any exudate or lesions.Normal dentition.  Neck: normal, supple, no masses, no thyromegaly Respiratory: clear to auscultation bilaterally, no wheezing, no crackles. Normal respiratory effort. No accessory muscle use.  Cardiovascular: Regular rate and rhythm, no murmurs / rubs / gallops. 2+ b/l Lower extremity edema. 2+ pedal pulses. No carotid bruits.  Abdomen: no tenderness, no masses palpated. No hepatosplenomegaly. Bowel sounds  positive.  Musculoskeletal: no clubbing / cyanosis. No joint deformity upper and lower extremities. Good ROM, no contractures. Normal muscle tone.  Skin: no rashes, lesions, ulcers. No induration Neurologic: CN 2-12 grossly intact. Sensation intact, DTR normal. Strength 5/5 x all 4 extremities.  Psychiatric: Normal judgment and insight. Alert and oriented x 3. Normal mood.   Data Reviewed:  There are no new results to review at this time.  Family Communication: Daughter at the bedside  Disposition: Status is: Inpatient Remains inpatient appropriate because: Acute CHF  Planned Discharge Destination: Home    Time spent: 41 minutes  Author: Deliliah Room, MD 11/06/2024 12:53 PM  For on call review [email or web address].  "

## 2024-11-06 NOTE — Progress Notes (Addendum)
 PHARMACY - ANTICOAGULATION CONSULT NOTE  Pharmacy Consult for warfarin Indication: atrial fibrillation  Allergies[1]  Patient Measurements: Height: 5' 6 (167.6 cm) Weight: 88.1 kg (194 lb 3.2 oz) IBW/kg (Calculated) : 63.8 HEPARIN  DW (KG): 82.3  Vital Signs: Temp: 97.7 F (36.5 C) (01/16 0959) Temp Source: Oral (01/16 0959) BP: 138/27 (01/16 0845) Pulse Rate: 44 (01/16 0845)  Labs: Recent Labs    11/05/24 1652 11/06/24 0346  HGB 10.7* 9.9*  HCT 35.6* 32.1*  PLT 167 168  LABPROT  --  24.0*  INR  --  2.0*  CREATININE 1.98* 1.92*    Estimated Creatinine Clearance: 30.3 mL/min (A) (by C-G formula based on SCr of 1.92 mg/dL (H)).   Medical History: Past Medical History:  Diagnosis Date   (HFpEF) heart failure with preserved ejection fraction (HCC)    Arthritis    Atrial fibrillation (HCC)    Bradycardia    Carotid artery disease    Hyperlipidemia    Hypertension    Pulmonary hypertension (HCC)    Type 2 diabetes mellitus (HCC)     Medications:  -Warfarin 7.5mg  PO on Monday, Friday and 5mg  PO all other days (verified with wife)  Assessment: 32 yoM presented with shortness of breath. Pharmacy consulted to dose warfarin while inpatient for atrial fibrillation  INR 2.0 therapeutic today. Hgb 9.9, pltc 168 stable.  Significantly volume overloaded, LFTs and albumin  WNL.  Goal of Therapy:  INR 2-3 Monitor platelets by anticoagulation protocol: Yes   Plan:  Continue PTA regimen - Warfarin 7.5 mg PO x 1  Monitor daily INR, CBC, PO intake and new DDIs  Maurilio Fila, PharmD Clinical Pharmacist 11/06/2024  11:52 AM     [1]  Allergies Allergen Reactions   Other Rash    Bandaids

## 2024-11-06 NOTE — ED Notes (Signed)
 MD made aware of low heart rate in the 30's and recent low mean arterial blood pressure.

## 2024-11-06 NOTE — Progress Notes (Signed)
 Orthopedic Tech Progress Note Patient Details:  Robert Baldwin Apr 14, 1941 [id number]  Ortho Devices Type of Ortho Device: Radio broadcast assistant Ortho Device/Splint Location: BLE Ortho Device/Splint Interventions: Ordered, Adjustment, Application   Post Interventions Patient Tolerated: Well Instructions Provided: Care of device, Adjustment of device  Robert Baldwin 11/06/2024, 5:00 PM

## 2024-11-07 DIAGNOSIS — I5033 Acute on chronic diastolic (congestive) heart failure: Secondary | ICD-10-CM | POA: Diagnosis not present

## 2024-11-07 LAB — PROTIME-INR
INR: 2.2 — ABNORMAL HIGH (ref 0.8–1.2)
Prothrombin Time: 25.4 s — ABNORMAL HIGH (ref 11.4–15.2)

## 2024-11-07 LAB — BASIC METABOLIC PANEL WITH GFR
Anion gap: 9 (ref 5–15)
BUN: 69 mg/dL — ABNORMAL HIGH (ref 8–23)
CO2: 31 mmol/L (ref 22–32)
Calcium: 8.3 mg/dL — ABNORMAL LOW (ref 8.9–10.3)
Chloride: 99 mmol/L (ref 98–111)
Creatinine, Ser: 2.07 mg/dL — ABNORMAL HIGH (ref 0.61–1.24)
GFR, Estimated: 31 mL/min — ABNORMAL LOW
Glucose, Bld: 115 mg/dL — ABNORMAL HIGH (ref 70–99)
Potassium: 4.4 mmol/L (ref 3.5–5.1)
Sodium: 139 mmol/L (ref 135–145)

## 2024-11-07 LAB — CBC
HCT: 28.5 % — ABNORMAL LOW (ref 39.0–52.0)
Hemoglobin: 9.1 g/dL — ABNORMAL LOW (ref 13.0–17.0)
MCH: 27.2 pg (ref 26.0–34.0)
MCHC: 31.9 g/dL (ref 30.0–36.0)
MCV: 85.1 fL (ref 80.0–100.0)
Platelets: 147 K/uL — ABNORMAL LOW (ref 150–400)
RBC: 3.35 MIL/uL — ABNORMAL LOW (ref 4.22–5.81)
RDW: 17.2 % — ABNORMAL HIGH (ref 11.5–15.5)
WBC: 6 K/uL (ref 4.0–10.5)
nRBC: 0 % (ref 0.0–0.2)

## 2024-11-07 LAB — GLUCOSE, CAPILLARY
Glucose-Capillary: 134 mg/dL — ABNORMAL HIGH (ref 70–99)
Glucose-Capillary: 160 mg/dL — ABNORMAL HIGH (ref 70–99)
Glucose-Capillary: 121 mg/dL — ABNORMAL HIGH (ref 70–99)
Glucose-Capillary: 143 mg/dL — ABNORMAL HIGH (ref 70–99)

## 2024-11-07 MED ORDER — METOLAZONE 2.5 MG PO TABS
2.5000 mg | ORAL_TABLET | Freq: Once | ORAL | Status: AC
  Administered 2024-11-07: 2.5 mg via ORAL
  Filled 2024-11-07: qty 1

## 2024-11-07 NOTE — Progress Notes (Addendum)
 " Cardiology Progress Note  Patient ID: Robert Baldwin MRN: [id number] DOB: 1941-01-30 Date of Encounter: 11/07/2024 Primary Cardiologist: Jayson Sierras, MD  Subjective   Chief Complaint: SOB  HPI: Good response to lasix . Still a bit volume up.   ROS:  All other ROS reviewed and negative. Pertinent positives noted in the HPI.     Telemetry  Overnight telemetry shows Afib 40-50 bpm, which I personally reviewed.   Physical Exam   Vitals:   11/07/24 0410 11/07/24 0421 11/07/24 0751 11/07/24 0913  BP:  (!) 159/50 (!) 140/41 (!) 140/41  Pulse:  (!) 50 80   Resp:  18 17   Temp:  97.6 F (36.4 C) 97.9 F (36.6 C)   TempSrc:  Oral Oral   SpO2:  94% 98%   Weight: 83.1 kg     Height:        Intake/Output Summary (Last 24 hours) at 11/07/2024 0925 Last data filed at 11/07/2024 0912 Gross per 24 hour  Intake 476 ml  Output 3350 ml  Net -2874 ml       11/07/2024    4:10 AM 11/06/2024    4:24 PM 11/05/2024    4:26 PM  Last 3 Weights  Weight (lbs) 183 lb 3.2 oz 184 lb 1.4 oz 194 lb 3.2 oz  Weight (kg) 83.1 kg 83.5 kg 88.089 kg    Body mass index is 28.69 kg/m.  General: Well nourished, well developed, in no acute distress Head: Atraumatic, normal size  Eyes: PEERLA, EOMI  Neck: Supple, JVD 7-8 cm HO Endocrine: No thryomegaly Cardiac: Normal S1, S2; RRR; no murmurs, rubs, or gallops Lungs: Clear to auscultation bilaterally, no wheezing, rhonchi or rales  Abd: Soft, nontender, no hepatomegaly  Ext: 1+ pitting edema  Musculoskeletal: No deformities, BUE and BLE strength normal and equal Skin: Warm and dry, no rashes   Neuro: Alert and oriented to person, place, time, and situation, CNII-XII grossly intact, no focal deficits  Psych: Normal mood and affect   Cardiac Studies  TTE 11/06/2025  1. Left ventricular ejection fraction, by estimation, is 65 to 70%. The  left ventricle has normal function. There is moderate asymmetric left  ventricular hypertrophy. Left ventricular  diastolic parameters are  indeterminate.   2. Right ventricular systolic function is mildly reduced. The right  ventricular size is moderately enlarged.   3. Left atrial size was mild to moderately dilated.   4. Right atrial size was severely dilated.   5. Pericardial effusion is circumferential and small except for posterior  to RA . Moderate pericardial effusion.   6. Trivial mitral valve regurgitation. Moderate mitral annular  calcification.   7. Tricuspid valve regurgitation is severe.   8. AV is thickened with minimally restricted motion. Peak and mean  gradients through the valve are 23 and 11 mm HG respectively, not  signficantly changed from echo in Jan 2025 . The aortic valve is  tricuspid. Aortic valve regurgitation is not  visualized. Aortic valve sclerosis/calcification is present, without any  evidence of aortic stenosis.   9. The inferior vena cava is dilated in size with <50% respiratory  variability, suggesting right atrial pressure of 15 mmHg.   LHC 04/30/2022 No angiographically significant coronary artery disease. Severely elevated pulmonary artery and right heart filling pressures (mean PA 58 mmHg, mean RA 25 mmHg). Mildly elevated left heart filling pressure (LVEDP 22 mmHg). Normal Fick cardiac output/index (CO 6.4 L/min, CI 3.1 L/min/m). Small, heavily calcified right radial artery precluding catheter advancement  from this approach.  Recommend alternative access if future catheterizations are needed.  Patient Profile  DAWAUN Baldwin is a 84 y.o. male with HFpEF, pulmonary hypertension/cor pulmonale, carotid artery disease, permanent atrial fibrillation, sick sinus syndrome, CKD 3B, diabetes admitted on 09/05/2025 for acute on chronic diastolic heart failure and cor pulmonale.  Assessment & Plan   # Acute on chronic diastolic heart failure # Pulmonary hypertension/cor pulmonale # CKD 3B - Good response to Lasix .  Net -3.3 L.  Continue with 80 mg IV twice daily.  Continue farxiga  10 mg daily.  -Redose metolazone  2.5 mg today. - Has mixed Group 1 and group 2 pulmonary hypertension. - Continue home PAH therapy: macitentan  10 mg, cialis  40 mg - No signs of shock.  Good urine output.  I think we can hold on PICC line for now. - Vital signs are stable.  Does not appear to be in shock.  # Permanent atrial fibrillation # Slow ventricular response - No AV nodal agents.  On Coumadin .  Continue this.  # Carotid artery disease - Status post bilateral revascularization.  I think he can hold aspirin  since he is on Coumadin . - Continue statin.  # Elevated troponin, demand - Normal left heart cath in 2023.  Demand related to right heart failure.  # HTN - Continue hydralazine  50 mg 3 times daily.     For questions or updates, please contact [facility] Please consult [email or web address] for contact info under        Signed, Darryle T. Barbaraann, MD, [facility]   [facility]  11/07/2024 9:25 AM   "

## 2024-11-07 NOTE — Plan of Care (Signed)
  Problem: Coping: Goal: Ability to adjust to condition or change in health will improve Outcome: Progressing   Problem: Education: Goal: Knowledge of General Education information will improve Description: Including pain rating scale, medication(s)/side effects and non-pharmacologic comfort measures Outcome: Progressing   

## 2024-11-07 NOTE — Progress Notes (Signed)
 PHARMACY - ANTICOAGULATION CONSULT NOTE  Pharmacy Consult for warfarin Indication: atrial fibrillation  Allergies[1]  Patient Measurements: Height: 5' 7 (170.2 cm) Weight: 83.1 kg (183 lb 3.2 oz) IBW/kg (Calculated) : 66.1 HEPARIN  DW (KG): 82.9  Vital Signs: Temp: 97.9 F (36.6 C) (01/17 0751) Temp Source: Oral (01/17 0751) BP: 140/41 (01/17 0751) Pulse Rate: 80 (01/17 0751)  Labs: Recent Labs    11/05/24 1652 11/06/24 0346 11/07/24 0155  HGB 10.7* 9.9* 9.1*  HCT 35.6* 32.1* 28.5*  PLT 167 168 147*  LABPROT  --  24.0* 25.4*  INR  --  2.0* 2.2*  CREATININE 1.98* 1.92* 2.07*    Estimated Creatinine Clearance: 27.9 mL/min (A) (by C-G formula based on SCr of 2.07 mg/dL (H)).   Medical History: Past Medical History:  Diagnosis Date   (HFpEF) heart failure with preserved ejection fraction (HCC)    Arthritis    Atrial fibrillation (HCC)    Bradycardia    Carotid artery disease    Hyperlipidemia    Hypertension    Pulmonary hypertension (HCC)    Type 2 diabetes mellitus (HCC)     Medications:  -Warfarin 7.5mg  PO on Monday, Friday and 5mg  PO all other days (verified with wife)  Assessment: 42 yoM presented with shortness of breath. Pharmacy consulted to dose warfarin while inpatient for atrial fibrillation  INR 2.2 therapeutic today. Hgb 9.1, pltc 147 stable.  Significantly volume overloaded, LFTs and albumin  WNL.  Goal of Therapy:  INR 2-3 Monitor platelets by anticoagulation protocol: Yes   Plan:  Continue PTA regimen - Warfarin 5 mg PO x 1  Monitor daily INR, CBC, PO intake and new DDIs  Thank you for allowing pharmacy to be involved with this patient's care.  Mendel Barter, PharmD PGY1 Clinical Pharmacist [facility]  11/07/2024 8:33 AM    [1]  Allergies Allergen Reactions   Other Rash    Bandaids

## 2024-11-07 NOTE — Progress Notes (Addendum)
 " Progress Note   Patient: Robert Baldwin FMW:[id number] DOB: Mar 28, 1941 DOA: 11/05/2024     2 DOS: the patient was seen and examined on 11/07/2024   Brief hospital course:  84 y.o. 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  He was sent to the ED by cardiology for IV diuresis due to concern for volume overload.  Heart failure team is on board On IV lasix  TTE: 65-70%, severe TR   Assessment and Plan:  Acute on chronic HFpEF with RV failure, POA: NYHA IV  Appears volume overloaded on exam.  proBNP 3944.   Chest x-ray showing pulmonary vascular congestion without overt edema.  Cardiology consulted .     Continue Farxiga  10 mg daily. Monitor intake and output, daily weights, low-sodium diet with fluid restriction, monitor renal function and electrolytes. TTE done on 1/16 showed EF of 65-70%, RV size is moderately enlarged, moderate pericardial effusion, severe TR Continue IV lasix  80 mg BID with close monitoring of renal function and electrolytes. Given a dose of metolazone  2.5 mg today Off olmesartan because of renal dysfunction. Not a candidate for beta blockers in the setting of bradycardia   Elevated troponin History of CAD Troponin 120> 117.   likely due to demand ischemia from decompensated heart failure rather than ACS.  Patient is not endorsing chest pain.  Continue aspirin  and statin.   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 RHC 7/24 showed mildly elevated PCWP with prominent v-waves suggestive of mitral valve disease, severe mixed pulmonary arterial/pulmonary venous hypertension with PVR of  3.54 WU, mildly elevated RA pressure and preserved CO.  Diuretic as above.  Continue macitentan  and tadalafil . May need RHC   QT prolongation Potassium is within normal range. Avoid QT prolonging drugs.   Permanent A-fib with slow ventricular response Heart rate in the 40s and not on any AV nodal blocking agents.  INR 2.0 on labs a week ago, continue to monitor.  Coumadin  dosing per pharmacy.   Type 2 diabetes A1c is 6.5%. Continue Farxiga .  Placed on sensitive sliding scale insulin  ACHS.   Hypertension Most recent SBP in the 170s.  Diuretic as above.  Continue home p.o. hydralazine  50 mg TID.  IV hydralazine  PRN SBP >180.   CKD stage IIIb Creatinine is 2.0 up from 1.9 yesterday. F/u BMP in am.   Hyperlipidemia Continue Lipitor.  Class I obesity,POA: Likely a candidate for GLP-1 RA as an outpatient.   Chronic anemia Hemoglobin stable.  Disposition: Lives at home with his wife.     Subjective: No acute events overnight. Denied chest pain or shortness of breath.  Physical Exam: Vitals:   11/07/24 0405 11/07/24 0410 11/07/24 0421 11/07/24 0751  BP: (!) 150/37  (!) 159/50 (!) 140/41  Pulse: (!) 37  (!) 50 80  Resp: 16  18 17   Temp: 97.6 F (36.4 C)  97.6 F (36.4 C) 97.9 F (36.6 C)  TempSrc: Oral  Oral Oral  SpO2: 93%  94% 98%  Weight:  83.1 kg    Height:       Constitutional: NAD, calm, comfortable Eyes: PERRL, lids and conjunctivae normal ENMT: Mucous membranes are moist. Posterior pharynx clear of any exudate  or lesions.Normal dentition.  Neck: normal, supple, no masses, no thyromegaly Respiratory: clear to auscultation bilaterally, no wheezing, no crackles. Normal respiratory effort. No accessory muscle use.  Cardiovascular: Regular rate and rhythm, no murmurs / rubs / gallops. 2+ b/l Lower extremity edema. 2+ pedal pulses. No carotid bruits.  Abdomen: no tenderness, no masses palpated. No hepatosplenomegaly. Bowel sounds positive.  Musculoskeletal: no clubbing  / cyanosis. No joint deformity upper and lower extremities. Good ROM, no contractures. Normal muscle tone.  Skin: no rashes, lesions, ulcers. No induration Neurologic: CN 2-12 grossly intact. Sensation intact, DTR normal. Strength 5/5 x all 4 extremities.  Psychiatric: Normal judgment and insight. Alert and oriented x 3. Normal mood.   Data Reviewed:  There are no new results to review at this time.  Family Communication: Daughter at the bedside  Disposition: Status is: Inpatient Remains inpatient appropriate because: Acute CHF  Planned Discharge Destination: Home    Time spent: 41 minutes  Author: Deliliah Room, MD 11/07/2024 9:10 AM  For on call review [email or web address].  "

## 2024-11-07 NOTE — Evaluation (Signed)
 Physical Therapy Evaluation Patient Details Name: Robert Baldwin MRN: [id number] DOB: 22-Nov-1940 Today's Date: 11/07/2024  History of Present Illness  The pt is an 84 yo male presenting 1/15 from HF clinic with SOB and LE edema. Admitted for management of acute CHF exacerbation. PMH includes: HFpEF, arthritis, afib, bradycardia, CAD, HLD, HTN, pulmonary HTN, DM II.  Clinical Impression  Pt in bed upon arrival of PT, agreeable to evaluation at this time. Prior to admission the pt was ambulating without assist, intermittently using cane in community. The pt reports he had been active with cardiac rehab and was planning to start phase 2 soon. The pt required minA to complete bed mobility, and CGA to rise to standing, but was then able to ambulate with CGA without overt LOB. However, as the pt fatigued, he demos increased sway and dependence on hallway rail for support, and demos increased SOB with ambulation. Upon return to room SpO2 86% on RA and required 5 min seated rest to return to 93%. Given current deficits in endurance and dynamic stability, recommend use of RW at home and home O2 during exertion (pt reports having O2 but not using). Also recommend resume working with cardiac rehab to progress endurance and mobility, pt in agreement with plan. Will continue to follow acutely, but pt can return home once medically stable.    If plan is discharge home, recommend the following: A little help with walking and/or transfers;A little help with bathing/dressing/bathroom;Assistance with cooking/housework;Assist for transportation;Help with stairs or ramp for entrance   Can travel by private vehicle        Equipment Recommendations None recommended by PT  Recommendations for Other Services       Functional Status Assessment Patient has had a recent decline in their functional status and demonstrates the ability to make significant improvements in function in a reasonable and predictable amount of time.      Precautions / Restrictions Precautions Precautions: Fall Recall of Precautions/Restrictions: Intact Restrictions Weight Bearing Restrictions Per Provider Order: No      Mobility  Bed Mobility Overal bed mobility: Needs Assistance Bed Mobility: Supine to Sit     Supine to sit: Min assist     General bed mobility comments: minA with pt pulling up on therapist, able to scoot to EOB without assist    Transfers Overall transfer level: Needs assistance Equipment used: None Transfers: Sit to/from Stand Sit to Stand: Contact guard assist           General transfer comment: mild sway but no overt LOB    Ambulation/Gait Ambulation/Gait assistance: Contact guard assist, Min assist Gait Distance (Feet): 200 Feet Assistive device: None (hallway rail) Gait Pattern/deviations: Step-through pattern, Decreased stride length, Trunk flexed Gait velocity: pt reports close to normal Gait velocity interpretation: 1.31 - 2.62 ft/sec, indicative of limited community ambulator   General Gait Details: pt with increased SOB and intermittently reaching for hallway rail despite declining DME. unable to get SpO2 during gait but 86% on RA after gait with pt 3/4 DOE.    Balance Overall balance assessment: Needs assistance Sitting-balance support: No upper extremity supported Sitting balance-Leahy Scale: Good     Standing balance support: Single extremity supported, During functional activity Standing balance-Leahy Scale: Fair Standing balance comment: poor tolerance for challenge, intermittently reaching for single UE support                             Pertinent Vitals/Pain Pain  Assessment Pain Assessment: No/denies pain    Home Living Family/patient expects to be discharged to:: Private residence Living Arrangements: Spouse/significant other Available Help at Discharge: Family;Available 24 hours/day Type of Home: House Home Access: Stairs to enter Entrance  Stairs-Rails: Left Entrance Stairs-Number of Steps: 4   Home Layout: One level Home Equipment: Grab bars - tub/shower;Hand held shower head;Shower seat;Rolling Environmental Consultant (2 wheels)      Prior Function Prior Level of Function : Independent/Modified Independent;Working/employed;Driving             Mobility Comments: no use of DME, attends cardiac rehab at [place] [facility] ADLs Comments: independent     Extremity/Trunk Assessment   Upper Extremity Assessment Upper Extremity Assessment: Overall WFL for tasks assessed;Right hand dominant (RUE slightly stronger than LUE)    Lower Extremity Assessment Lower Extremity Assessment: Overall WFL for tasks assessed    Cervical / Trunk Assessment Cervical / Trunk Assessment: Kyphotic  Communication   Communication Communication: No apparent difficulties    Cognition Arousal: Alert Behavior During Therapy: WFL for tasks assessed/performed   PT - Cognitive impairments: No apparent impairments                       PT - Cognition Comments: following commands well, not formally assessed in the session Following commands: Intact       Cueing Cueing Techniques: Verbal cues     General Comments General comments (skin integrity, edema, etc.): SpO2 to 86% on RA after 200 ft ambulation, recovered to 93% with 5 min seated rest.    Exercises     Assessment/Plan    PT Assessment Patient needs continued PT services  PT Problem List Decreased strength;Decreased activity tolerance;Decreased balance;Decreased mobility;Cardiopulmonary status limiting activity       PT Treatment Interventions DME instruction;Gait training;Stair training;Functional mobility training;Therapeutic activities;Therapeutic exercise;Balance training;Patient/family education    PT Goals (Current goals can be found in the Care Plan section)  Acute Rehab PT Goals Patient Stated Goal: to return home and stay independent PT Goal Formulation: With  patient Time For Goal Achievement: 11/21/24 Potential to Achieve Goals: Good    Frequency Min 2X/week        AM-PAC PT 6 Clicks Mobility  Outcome Measure Help needed turning from your back to your side while in a flat bed without using bedrails?: None Help needed moving from lying on your back to sitting on the side of a flat bed without using bedrails?: A Little Help needed moving to and from a bed to a chair (including a wheelchair)?: A Little Help needed standing up from a chair using your arms (e.g., wheelchair or bedside chair)?: A Little Help needed to walk in hospital room?: A Little Help needed climbing 3-5 steps with a railing? : A Little 6 Click Score: 19    End of Session Equipment Utilized During Treatment: Gait belt Activity Tolerance: Patient tolerated treatment well Patient left: in chair;with call bell/phone within reach Nurse Communication: Mobility status;Other (comment) (SpO2 drop with gait) PT Visit Diagnosis: Unsteadiness on feet (R26.81);Other abnormalities of gait and mobility (R26.89)    Time: 8584-8565 PT Time Calculation (min) (ACUTE ONLY): 19 min   Charges:   PT Evaluation $PT Eval Low Complexity: 1 Low   PT General Charges $$ ACUTE PT VISIT: 1 Visit         Izetta Call, PT, DPT   Acute Rehabilitation Department Office [phone] Secure Chat Communication Preferred  Izetta JULIANNA Call 11/07/2024, 5:52 PM

## 2024-11-08 DIAGNOSIS — I5033 Acute on chronic diastolic (congestive) heart failure: Secondary | ICD-10-CM | POA: Diagnosis not present

## 2024-11-08 LAB — CBC
HCT: 28.1 % — ABNORMAL LOW (ref 39.0–52.0)
Hemoglobin: 8.9 g/dL — ABNORMAL LOW (ref 13.0–17.0)
MCH: 26.7 pg (ref 26.0–34.0)
MCHC: 31.7 g/dL (ref 30.0–36.0)
MCV: 84.4 fL (ref 80.0–100.0)
Platelets: 145 K/uL — ABNORMAL LOW (ref 150–400)
RBC: 3.33 MIL/uL — ABNORMAL LOW (ref 4.22–5.81)
RDW: 17.3 % — ABNORMAL HIGH (ref 11.5–15.5)
WBC: 5.8 K/uL (ref 4.0–10.5)
nRBC: 0 % (ref 0.0–0.2)

## 2024-11-08 LAB — BASIC METABOLIC PANEL WITH GFR
Anion gap: 12 (ref 5–15)
BUN: 74 mg/dL — ABNORMAL HIGH (ref 8–23)
CO2: 31 mmol/L (ref 22–32)
Calcium: 8.3 mg/dL — ABNORMAL LOW (ref 8.9–10.3)
Chloride: 97 mmol/L — ABNORMAL LOW (ref 98–111)
Creatinine, Ser: 2.29 mg/dL — ABNORMAL HIGH (ref 0.61–1.24)
GFR, Estimated: 28 mL/min — ABNORMAL LOW
Glucose, Bld: 115 mg/dL — ABNORMAL HIGH (ref 70–99)
Potassium: 3.8 mmol/L (ref 3.5–5.1)
Sodium: 139 mmol/L (ref 135–145)

## 2024-11-08 LAB — GLUCOSE, CAPILLARY
Glucose-Capillary: 109 mg/dL — ABNORMAL HIGH (ref 70–99)
Glucose-Capillary: 133 mg/dL — ABNORMAL HIGH (ref 70–99)
Glucose-Capillary: 131 mg/dL — ABNORMAL HIGH (ref 70–99)
Glucose-Capillary: 124 mg/dL — ABNORMAL HIGH (ref 70–99)

## 2024-11-08 LAB — PROTIME-INR
INR: 2.2 — ABNORMAL HIGH (ref 0.8–1.2)
Prothrombin Time: 25.6 s — ABNORMAL HIGH (ref 11.4–15.2)

## 2024-11-08 MED ORDER — POTASSIUM CHLORIDE CRYS ER 20 MEQ PO TBCR
40.0000 meq | EXTENDED_RELEASE_TABLET | Freq: Once | ORAL | Status: AC
  Administered 2024-11-08: 40 meq via ORAL
  Filled 2024-11-08: qty 2

## 2024-11-08 MED ORDER — METOLAZONE 2.5 MG PO TABS
2.5000 mg | ORAL_TABLET | Freq: Once | ORAL | Status: AC
  Administered 2024-11-08: 2.5 mg via ORAL
  Filled 2024-11-08: qty 1

## 2024-11-08 NOTE — Plan of Care (Signed)

## 2024-11-08 NOTE — Plan of Care (Signed)
  Problem: Nutritional: Goal: Maintenance of adequate nutrition will improve Outcome: Progressing   Problem: Education: Goal: Knowledge of General Education information will improve Description: Including pain rating scale, medication(s)/side effects and non-pharmacologic comfort measures Outcome: Progressing   Problem: Activity: Goal: Risk for activity intolerance will decrease Outcome: Progressing

## 2024-11-08 NOTE — Progress Notes (Signed)
 SpO2 87-89% on RA. Pt denied SOB, no signs of respiratory distress.  Pt placed on 2L of O2 via nasal cannula, SpO2 96%.

## 2024-11-08 NOTE — Progress Notes (Signed)
 Mobility Specialist Progress Note:    11/08/24 1047  Mobility  Activity Ambulated with assistance  Level of Assistance Standby assist, set-up cues, supervision of patient - no hands on  Assistive Device None  Distance Ambulated (ft) 200 ft  Range of Motion/Exercises Active  Activity Response Tolerated fair  Mobility Referral Yes  Mobility visit 1 Mobility  Mobility Specialist Start Time (ACUTE ONLY) 1047  Mobility Specialist Stop Time (ACUTE ONLY) 1104  Mobility Specialist Time Calculation (min) (ACUTE ONLY) 17 min   Received pt in recliner agreeable to session. No c/o any symptoms. Pt moving and ambulating well. Pt starting stay above 90%SpO2 but after 168ft, starts to decline to around 86%. Recovers quickly at rest or on 2L. Returned pt to recliner w/ all needs met.   Venetia Keel Mobility Specialist Please Neurosurgeon or Rehab Office at [phone]

## 2024-11-08 NOTE — Progress Notes (Signed)
 PHARMACY - ANTICOAGULATION CONSULT NOTE  Pharmacy Consult for warfarin Indication: atrial fibrillation  Allergies[1]  Patient Measurements: Height: 5' 7 (170.2 cm) Weight: 81.6 kg (179 lb 12.8 oz) IBW/kg (Calculated) : 66.1 HEPARIN  DW (KG): 82.9  Vital Signs: Temp: 98.6 F (37 C) (01/18 0745) Temp Source: Oral (01/18 0745) BP: 145/57 (01/18 0745) Pulse Rate: 42 (01/18 0745)  Labs: Recent Labs    11/06/24 0346 11/07/24 0155 11/08/24 0227  HGB 9.9* 9.1* 8.9*  HCT 32.1* 28.5* 28.1*  PLT 168 147* 145*  LABPROT 24.0* 25.4* 25.6*  INR 2.0* 2.2* 2.2*  CREATININE 1.92* 2.07* 2.29*    Estimated Creatinine Clearance: 25 mL/min (A) (by C-G formula based on SCr of 2.29 mg/dL (H)).   Medical History: Past Medical History:  Diagnosis Date   (HFpEF) heart failure with preserved ejection fraction (HCC)    Arthritis    Atrial fibrillation (HCC)    Bradycardia    Carotid artery disease    Hyperlipidemia    Hypertension    Pulmonary hypertension (HCC)    Type 2 diabetes mellitus (HCC)     Medications:  -Warfarin 7.5mg  PO on Monday, Friday and 5mg  PO all other days (verified with wife)  Assessment: 89 yoM presented with shortness of breath. Pharmacy consulted to dose warfarin while inpatient for atrial fibrillation  INR 2.2 therapeutic today. Hgb 8.9, pltc 145 stable.  Significantly volume overloaded, LFTs and albumin  WNL.  Goal of Therapy:  INR 2-3 Monitor platelets by anticoagulation protocol: Yes   Plan:  Continue PTA regimen - Warfarin 5 mg PO x 1  Monitor daily INR, CBC, PO intake and new DDIs  Thank you for allowing pharmacy to be involved with this patient's care.  Mendel Barter, PharmD PGY1 Clinical Pharmacist [facility]  11/08/2024 8:49 AM     [1]  Allergies Allergen Reactions   Other Rash    Bandaids

## 2024-11-08 NOTE — Progress Notes (Addendum)
 " Progress Note   Patient: Robert Baldwin FMW:[id number] DOB: 1941-09-27 DOA: 11/05/2024     3 DOS: the patient was seen and examined on 11/08/2024   Brief hospital course:  84 y.o. 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  He was sent to the ED by cardiology for IV diuresis due to concern for volume overload.  Heart failure team is on board On IV lasix  TTE: 65-70%, severe TR  Likely transition to po lasix  on 1/19 and dc home.  Home O2 eval will be done prior to the discharge.   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  proBNP 3944.   Chest x-ray showing pulmonary vascular congestion without overt edema.  Cardiology consulted .     Continue Farxiga  10 mg daily. Monitor intake and output, daily weights, low-sodium diet with fluid restriction, monitor renal function and electrolytes. TTE done on 1/16 showed EF of 65-70%, RV size is moderately enlarged, moderate pericardial effusion, severe TR Continue IV lasix  80 mg BID with close monitoring of renal function and electrolytes. Likely transition to po torsemide  on 1/19. Given a dose of metolazone  2.5 mg today Off olmesartan because of renal dysfunction. Not a candidate for beta blockers in the setting of bradycardia   Elevated troponin History of CAD Troponin 120> 117.   likely due to demand ischemia from decompensated heart failure rather than ACS.  Patient is not endorsing chest pain.  Continue aspirin  and statin.   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 RHC 7/24 showed  mildly elevated PCWP with prominent v-waves suggestive of mitral valve disease, severe mixed pulmonary arterial/pulmonary venous hypertension with PVR of 3.54 WU, mildly elevated RA pressure and preserved CO.  Diuretic as above.  Continue macitentan  and tadalafil . May need RHC   QT prolongation Potassium is within normal range. Avoid QT prolonging drugs.   Permanent A-fib with slow ventricular response Heart rate in the 40s and not on any AV nodal blocking agents.  Most recent INR of 2.2, Coumadin  dosing per pharmacy.   Type 2 diabetes A1c is 6.5%. Continue Farxiga .  Placed on sensitive sliding scale insulin  ACHS.   Hypertension Most recent SBP in the 170s.  Diuretic as above.  Continue home p.o. hydralazine  50 mg TID.  IV hydralazine  PRN SBP >180.   CKD stage IIIb Creatinine is 2.0 up from 1.9 yesterday. F/u BMP in am.   Hyperlipidemia Continue Lipitor.  Class I obesity,POA: Likely a candidate for GLP-1 RA as an outpatient.   Chronic anemia Hemoglobin stable.  Disposition: Lives at home with his wife.     Subjective: No acute events overnight. Feels great. He is happy that his ankle edema has resolved.  Physical Exam: Vitals:   11/07/24 2310 11/08/24 0325 11/08/24 0428 11/08/24 0745  BP: (!) 121/40 (!) 131/41  (!) 145/57  Pulse: (!) 42 (!) 43  (!) 42  Resp: 18 18  18   Temp: 97.7 F (36.5 C) 97.7 F (36.5 C)  98.6 F (37 C)  TempSrc: Oral Oral  Oral  SpO2: 93% 98%  90%  Weight:   81.6 kg  Height:       Constitutional: NAD, calm, comfortable Eyes: PERRL, lids and conjunctivae normal ENMT: Mucous membranes are moist. Posterior pharynx clear of any exudate or lesions.Normal dentition.  Neck: normal, supple, no masses, no thyromegaly Respiratory: clear to auscultation bilaterally, no wheezing, no crackles. Normal respiratory effort. No accessory muscle use.  Cardiovascular: Regular rate and rhythm, no murmurs / rubs / gallops. 2+ b/l Lower extremity edema. 2+ pedal  pulses. No carotid bruits.  Abdomen: no tenderness, no masses palpated. No hepatosplenomegaly. Bowel sounds positive.  Musculoskeletal: no clubbing / cyanosis. No joint deformity upper and lower extremities. Good ROM, no contractures. Normal muscle tone.  Skin: no rashes, lesions, ulcers. No induration Neurologic: CN 2-12 grossly intact. Sensation intact, DTR normal. Strength 5/5 x all 4 extremities. Data Reviewed:  There are no new results to review at this time.  Family Communication: None at the bedside  Disposition: Status is: Inpatient Remains inpatient appropriate because: Acute CHF  Planned Discharge Destination: Home    Time spent: 41 minutes  Author: Deliliah Room, MD 11/08/2024 9:53 AM  For on call review [email or web address].  "

## 2024-11-08 NOTE — Progress Notes (Signed)
 " Cardiology Progress Note  Patient ID: Robert Baldwin MRN: [id number] DOB: October 20, 1941 Date of Encounter: 11/08/2024 Primary Cardiologist: Jayson Sierras, MD  Subjective   Chief Complaint: SOB  HPI: Good response to Lasix .  Kidney function stable.  ROS:  All other ROS reviewed and negative. Pertinent positives noted in the HPI.     Telemetry  Overnight telemetry shows A-fib 40s, which I personally reviewed.    Physical Exam   Vitals:   11/07/24 2310 11/08/24 0325 11/08/24 0428 11/08/24 0745  BP: (!) 121/40 (!) 131/41  (!) 145/57  Pulse: (!) 42 (!) 43  (!) 42  Resp: 18 18  18   Temp: 97.7 F (36.5 C) 97.7 F (36.5 C)  98.6 F (37 C)  TempSrc: Oral Oral  Oral  SpO2: 93% 98%  90%  Weight:   81.6 kg   Height:        Intake/Output Summary (Last 24 hours) at 11/08/2024 0932 Last data filed at 11/08/2024 0336 Gross per 24 hour  Intake 720 ml  Output 2600 ml  Net -1880 ml       11/08/2024    4:28 AM 11/07/2024    4:10 AM 11/06/2024    4:24 PM  Last 3 Weights  Weight (lbs) 179 lb 12.8 oz 183 lb 3.2 oz 184 lb 1.4 oz  Weight (kg) 81.557 kg 83.1 kg 83.5 kg    Body mass index is 28.16 kg/m.  General: Well nourished, well developed, in no acute distress Head: Atraumatic, normal size  Eyes: PEERLA, EOMI  Neck: Supple, JVD 7-8 cmH2O Endocrine: No thryomegaly Cardiac: Normal S1, S2; RRR; no murmurs, rubs, or gallops Lungs: Clear to auscultation bilaterally, no wheezing, rhonchi or rales  Abd: Soft, nontender, no hepatomegaly  Ext: Trace edema Musculoskeletal: No deformities, BUE and BLE strength normal and equal Skin: Warm and dry, no rashes   Neuro: Alert and oriented to person, place, time, and situation, CNII-XII grossly intact, no focal deficits  Psych: Normal mood and affect   Cardiac Studies  TTE 11/06/2024  1. Left ventricular ejection fraction, by estimation, is 65 to 70%. The  left ventricle has normal function. There is moderate asymmetric left  ventricular  hypertrophy. Left ventricular diastolic parameters are  indeterminate.   2. Right ventricular systolic function is mildly reduced. The right  ventricular size is moderately enlarged.   3. Left atrial size was mild to moderately dilated.   4. Right atrial size was severely dilated.   5. Pericardial effusion is circumferential and small except for posterior  to RA . Moderate pericardial effusion.   6. Trivial mitral valve regurgitation. Moderate mitral annular  calcification.   7. Tricuspid valve regurgitation is severe.   8. AV is thickened with minimally restricted motion. Peak and mean  gradients through the valve are 23 and 11 mm HG respectively, not  signficantly changed from echo in Jan 2025 . The aortic valve is  tricuspid. Aortic valve regurgitation is not  visualized. Aortic valve sclerosis/calcification is present, without any  evidence of aortic stenosis.   9. The inferior vena cava is dilated in size with <50% respiratory  variability, suggesting right atrial pressure of 15 mmHg.   Patient Profile  Robert Baldwin is a 84 y.o. male with HFpEF, pulmonary hypertension/cor pulmonale, carotid artery disease, permanent atrial fibrillation, sick sinus syndrome, CKD 3B, diabetes admitted on 11/05/2024 for acute on chronic diastolic heart failure and right heart failure.  Assessment & Plan   # Acute on chronic diastolic  heart failure # Pulmonary hypertension/cor pulmonale # CKD 3B - He is diuresing well.  Would recommend 80 mg IV Lasix  twice daily today.  Additional dose of metolazone  2.5 mg today.  Suspect he can be transition to torsemide  40 mg tomorrow.  Overall doing quite well.  No signs of shock. - Kidney function is close to baseline.  Would continue with diuresis today. - He has mixed group 1/2 pulmonary hypertension.  Continue home PAH therapy including macitentan  and Cialis .  # Permanent atrial fibrillation # Slow ventricular response # Sick sinus syndrome - No pacemaker in  place.  Avoid AV nodal agents.  Seems to be quite stable despite heart rate in the 40s.  On Coumadin  with INR goal 2-3.  # Carotid artery disease - Status post bilateral revascularization.  Would just continue with Coumadin  therapy.  Okay to hold aspirin .  # Elevated troponin, demand ischemia - Secondary to heart failure.  Normal left heart cath in 2023.  # HTN - Hydralazine  50 mg 3 times daily.     For questions or updates, please contact [facility] Please consult [email or web address] for contact info under        Signed, Darryle T. Barbaraann, MD, [facility]  [facility]  11/08/2024 9:32 AM   "

## 2024-11-09 ENCOUNTER — Encounter (HOSPITAL_COMMUNITY)

## 2024-11-09 DIAGNOSIS — I5033 Acute on chronic diastolic (congestive) heart failure: Secondary | ICD-10-CM | POA: Diagnosis not present

## 2024-11-09 LAB — GLUCOSE, CAPILLARY
Glucose-Capillary: 88 mg/dL (ref 70–99)
Glucose-Capillary: 153 mg/dL — ABNORMAL HIGH (ref 70–99)
Glucose-Capillary: 164 mg/dL — ABNORMAL HIGH (ref 70–99)
Glucose-Capillary: 127 mg/dL — ABNORMAL HIGH (ref 70–99)

## 2024-11-09 LAB — CBC
HCT: 29.8 % — ABNORMAL LOW (ref 39.0–52.0)
Hemoglobin: 9.4 g/dL — ABNORMAL LOW (ref 13.0–17.0)
MCH: 27 pg (ref 26.0–34.0)
MCHC: 31.5 g/dL (ref 30.0–36.0)
MCV: 85.6 fL (ref 80.0–100.0)
Platelets: 159 K/uL (ref 150–400)
RBC: 3.48 MIL/uL — ABNORMAL LOW (ref 4.22–5.81)
RDW: 17.2 % — ABNORMAL HIGH (ref 11.5–15.5)
WBC: 6.5 K/uL (ref 4.0–10.5)
nRBC: 0 % (ref 0.0–0.2)

## 2024-11-09 LAB — BASIC METABOLIC PANEL WITH GFR
Anion gap: 10 (ref 5–15)
BUN: 81 mg/dL — ABNORMAL HIGH (ref 8–23)
CO2: 34 mmol/L — ABNORMAL HIGH (ref 22–32)
Calcium: 8.4 mg/dL — ABNORMAL LOW (ref 8.9–10.3)
Chloride: 93 mmol/L — ABNORMAL LOW (ref 98–111)
Creatinine, Ser: 2.49 mg/dL — ABNORMAL HIGH (ref 0.61–1.24)
GFR, Estimated: 25 mL/min — ABNORMAL LOW
Glucose, Bld: 96 mg/dL (ref 70–99)
Potassium: 3.8 mmol/L (ref 3.5–5.1)
Sodium: 137 mmol/L (ref 135–145)

## 2024-11-09 LAB — PROTIME-INR
INR: 2.1 — ABNORMAL HIGH (ref 0.8–1.2)
Prothrombin Time: 24.3 s — ABNORMAL HIGH (ref 11.4–15.2)

## 2024-11-09 NOTE — Progress Notes (Signed)
 " Cardiology Progress Note  Patient ID: Robert Baldwin MRN: [id number] DOB: 09-14-1941 Date of Encounter: 11/09/2024 Primary Cardiologist: Jayson Sierras, MD  Subjective   Chief Complaint: SOB  HPI: Good response to lasix . Still a bit volume up.   ROS:  All other ROS reviewed and negative. Pertinent positives noted in the HPI.     Telemetry   Feels good. Wants to go home. Scr up slightly.   Physical Exam   Vitals:   11/09/24 0721 11/09/24 1006 11/09/24 1107 11/09/24 1154  BP: (!) 150/43  (!) 183/42 (!) 174/38  Pulse: (!) 38  (!) 39 (!) 41  Resp: 20     Temp: 97.7 F (36.5 C)  (!) 97.3 F (36.3 C)   TempSrc: Oral  Oral   SpO2: 99%  93% 90%  Weight:  80.1 kg    Height:  5' 7 (1.702 m)      Intake/Output Summary (Last 24 hours) at 11/09/2024 1156 Last data filed at 11/09/2024 0800 Gross per 24 hour  Intake 653 ml  Output 1900 ml  Net -1247 ml       11/09/2024   10:06 AM 11/09/2024    2:17 AM 11/08/2024    4:28 AM  Last 3 Weights  Weight (lbs) 176 lb 9.6 oz 179 lb 13.3 oz 179 lb 12.8 oz  Weight (kg) 80.105 kg 81.57 kg 81.557 kg    Body mass index is 27.66 kg/m.   General:  Sitting up in chair. No resp difficulty HEENT: normal Neck: supple.JVP 10 with prominent v waves Cor: Irregular rate & rhythm.2/6 TR Lungs: clear Abdomen: soft, nontender, nondistended.Good bowel sounds. Extremities: no cyanosis, clubbing, rash, + UNNA with tr-1+ edema Neuro: alert & orientedx3, cranial nerves grossly intact. moves all 4 extremities w/o difficulty. Affect pleasant  Cardiac Studies  TTE 11/06/2025  1. Left ventricular ejection fraction, by estimation, is 65 to 70%. The  left ventricle has normal function. There is moderate asymmetric left  ventricular hypertrophy. Left ventricular diastolic parameters are  indeterminate.   2. Right ventricular systolic function is mildly reduced. The right  ventricular size is moderately enlarged.   3. Left atrial size was mild to moderately  dilated.   4. Right atrial size was severely dilated.   5. Pericardial effusion is circumferential and small except for posterior  to RA . Moderate pericardial effusion.   6. Trivial mitral valve regurgitation. Moderate mitral annular  calcification.   7. Tricuspid valve regurgitation is severe.   8. AV is thickened with minimally restricted motion. Peak and mean  gradients through the valve are 23 and 11 mm HG respectively, not  signficantly changed from echo in Jan 2025 . The aortic valve is  tricuspid. Aortic valve regurgitation is not  visualized. Aortic valve sclerosis/calcification is present, without any  evidence of aortic stenosis.   9. The inferior vena cava is dilated in size with <50% respiratory  variability, suggesting right atrial pressure of 15 mmHg.   LHC 04/30/2022 No angiographically significant coronary artery disease. Severely elevated pulmonary artery and right heart filling pressures (mean PA 58 mmHg, mean RA 25 mmHg). Mildly elevated left heart filling pressure (LVEDP 22 mmHg). Normal Fick cardiac output/index (CO 6.4 L/min, CI 3.1 L/min/m). Small, heavily calcified right radial artery precluding catheter advancement from this approach.  Recommend alternative access if future catheterizations are needed.  Patient Profile  Robert Baldwin is a 84 y.o. male with HFpEF, pulmonary hypertension/cor pulmonale, carotid artery disease, permanent atrial fibrillation, sick  sinus syndrome, CKD 3B, diabetes admitted on 09/05/2025 for acute on chronic diastolic heart failure and cor pulmonale.  Assessment & Plan   # Acute on chronic diastolic heart failure # Pulmonary hypertension/cor pulmonale # Acute on CKD 3B - Volume status much improved. Perhaps a little on dry side today with bump in Scr.  - Hold diuretics today. If Scr stable/improved likely home tomorrow - Has mixed Group 1 and group 2 pulmonary hypertension. - Continue home PAH therapy: macitentan  10 mg, cialis  40  mg  # Permanent atrial fibrillation # Slow ventricular response - No AV nodal agents.  On Coumadin .  Continue this. - INR 2.1 Discussed warfarin dosing with PharmD personally.  # Carotid artery disease - Status post bilateral revascularization.  I think he can hold aspirin  since he is on Coumadin . - Continue statin.  # Elevated troponin, demand - Normal left heart cath in 2023.  Demand related to right heart failure.  # HTN - Continue hydralazine  50 mg 3 times daily.     For questions or updates, please contact  [facility] Please consult [email or web address] for contact info under        Toribio Fuel, MD  11:57 AM  "

## 2024-11-09 NOTE — Progress Notes (Signed)
 Mobility Specialist: Progress Note   11/09/24 1000  Mobility  Activity Ambulated with assistance  Level of Assistance Standby assist, set-up cues, supervision of patient - no hands on  Assistive Device None  Distance Ambulated (ft) 250 ft  Activity Response Tolerated well  Mobility Referral Yes  Mobility visit 1 Mobility  Mobility Specialist Start Time (ACUTE ONLY) 1015  Mobility Specialist Stop Time (ACUTE ONLY) 1030  Mobility Specialist Time Calculation (min) (ACUTE ONLY) 15 min    Pt received in chair, agreeable to mobility session. Completed ambulatory sat test (see following note). SV throughout w/o AD and frequent use of handrails. No complaints. SpO2 required 2L to maintain >88%. Returned to room. Left in chair in front of sink with all needs met, call bell in reach. NT aware.   Ileana Lute Mobility Specialist Please contact via SecureChat or Rehab office at [phone]

## 2024-11-09 NOTE — Progress Notes (Signed)
 PHARMACY - ANTICOAGULATION CONSULT NOTE  Pharmacy Consult for warfarin Indication: atrial fibrillation  Allergies[1]  Patient Measurements: Height: 5' 7 (170.2 cm) Weight: 81.6 kg (179 lb 13.3 oz) IBW/kg (Calculated) : 66.1 HEPARIN  DW (KG): 82.9  Vital Signs: Temp: 97.7 F (36.5 C) (01/19 0721) Temp Source: Oral (01/19 0721) BP: 150/43 (01/19 0721) Pulse Rate: 38 (01/19 0721)  Labs: Recent Labs    11/07/24 0155 11/08/24 0227 11/09/24 0233  HGB 9.1* 8.9* 9.4*  HCT 28.5* 28.1* 29.8*  PLT 147* 145* 159  LABPROT 25.4* 25.6* 24.3*  INR 2.2* 2.2* 2.1*  CREATININE 2.07* 2.29* 2.49*    Estimated Creatinine Clearance: 23 mL/min (A) (by C-G formula based on SCr of 2.49 mg/dL (H)).   Medical History: Past Medical History:  Diagnosis Date   (HFpEF) heart failure with preserved ejection fraction (HCC)    Arthritis    Atrial fibrillation (HCC)    Bradycardia    Carotid artery disease    Hyperlipidemia    Hypertension    Pulmonary hypertension (HCC)    Type 2 diabetes mellitus (HCC)     Medications:  -Warfarin 7.5mg  PO on Monday, Friday and 5mg  PO all other days (verified with wife)  Assessment: 89 yoM presented with shortness of breath. Pharmacy consulted to dose warfarin while inpatient for atrial fibrillation  INR is therapeutic at 2.1. Hgb 9.4, pltc 159- stable. No s/sx of bleeding. Oral intake similar to normal (pt endorses eating everything delivered to him for meals)  Goal of Therapy:  INR 2-3 Monitor platelets by anticoagulation protocol: Yes   Plan:  Continue PTA regimen - Warfarin 7.5 mg PO tonight x 1  Monitor daily INR, CBC, PO intake and new DDIs  Thank you for allowing pharmacy to participate in this patient's care,  Suzen Sour, PharmD, BCCCP Clinical Pharmacist  Phone: [phone] 11/09/2024 8:06 AM  Please check AMION for all [facility] phone numbers After 10:00 PM, call Main Pharmacy [phone]       [1]  Allergies Allergen  Reactions   Other Rash    Bandaids

## 2024-11-09 NOTE — Progress Notes (Signed)
 " Progress Note   Patient: Robert Baldwin FMW:[id number] DOB: 03-Aug-1941 DOA: 11/05/2024     4 DOS: the patient was seen and examined on 11/09/2024   Brief hospital course:  84 y.o. 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  He was sent to the ED by cardiology for IV diuresis due to concern for volume overload.  Heart failure team is on board On IV lasix  TTE: 65-70%, severe TR  Likely transition to po diuretics on 1/20 and dc home.  Home O2 eval done and needing 2 L oxygen  on exertion only.   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  proBNP 3944.   Chest x-ray showing pulmonary vascular congestion without overt edema.  Cardiology consulted .     Continue Farxiga  10 mg daily. Monitor intake and output, daily weights, low-sodium diet with fluid restriction, monitor renal function and electrolytes. TTE done on 1/16 showed EF of 65-70%, RV size is moderately enlarged, moderate pericardial effusion, severe TR Discontinued IV lasix  now. Will likely need oral diuretics on discharge. Off olmesartan because of renal dysfunction. Not a candidate for beta blockers in the setting of bradycardia  Acute hypoxic respiratory failure: Secondary to above. Needs 2 L oxygen  on exertion only. TOC consulted for home O2 arrangement.   Elevated troponin History of CAD Troponin 120> 117.   likely due to demand ischemia from decompensated heart failure rather than ACS.  Patient is not endorsing chest pain.  Continue aspirin  and statin.   Pulmonary arterial hypertension: Mixed (group 1 and 2), Severe  PAH on RHC 7/23, PVR 5.6 WU. 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  Diuretic as above.  Continue macitentan  and tadalafil . May need RHC   QT prolongation Potassium is within normal range. Avoid QT prolonging drugs.   Permanent A-fib with slow ventricular response Heart rate in the 40s and not on any AV nodal blocking agents.  Most recent INR of 2.2, Coumadin  dosing per pharmacy.   Type 2 diabetes A1c is 6.5%. Continue Farxiga .  Placed on sensitive sliding scale insulin  ACHS.   Hypertension Most recent SBP in the 170s.  Diuretic as above.  Continue home p.o. hydralazine  50 mg TID.  IV hydralazine  PRN SBP >180.   CKD stage IIIb Creatinine is 2.49 up from 2.2 yesterday. F/u BMP in am. Holding further diuresis.   Hyperlipidemia Continue Lipitor.  Class I obesity,POA: Likely a candidate for GLP-1 RA as an outpatient.   Chronic anemia Hemoglobin stable.  Disposition: Lives at home with his wife.     Subjective: No acute events overnight. Shortness of breath is better. I spoke to him about getting home O2 evaluation today.  Physical Exam: Vitals:   11/09/24 0302 11/09/24 0349 11/09/24 0721 11/09/24 1006  BP: (!) 129/43  (!) 150/43   Pulse: (!) 35 (!) 49 (!) 38   Resp: 18  20   Temp: 97.6 F (36.4 C)  97.7 F (36.5 C)   TempSrc: Oral  Oral   SpO2: 98% 97% 99%  Weight:    80.1 kg  Height:    5' 7 (1.702 m)   Constitutional: NAD, calm, comfortable Eyes: PERRL, lids and conjunctivae normal ENMT: Mucous membranes are moist. Posterior pharynx clear of any exudate or lesions.Normal dentition.  Neck: normal, supple, no masses, no thyromegaly Respiratory: clear to auscultation bilaterally, no wheezing, no crackles. Normal respiratory effort. No accessory muscle use.  Cardiovascular: Regular rate  and rhythm, no murmurs / rubs / gallops. 2+ b/l Lower extremity edema. 2+ pedal pulses. No carotid bruits.  Abdomen: no tenderness, no masses palpated. No hepatosplenomegaly. Bowel sounds positive.  Musculoskeletal: no clubbing / cyanosis. No joint deformity upper and lower extremities. Good ROM, no contractures. Normal muscle tone.  Skin: no rashes, lesions, ulcers. No induration Neurologic: CN 2-12 grossly intact. Sensation intact, DTR normal. Strength 5/5 x all 4 extremities. Data Reviewed:  There are no new results to review at this time.  Family Communication: None at the bedside  Disposition: Status is: Inpatient Remains inpatient appropriate because: Acute CHF  Planned Discharge Destination: Home    Time spent: 41 minutes  Author: Deliliah Room, MD 11/09/2024 11:46 AM  For on call review [email or web address].  "

## 2024-11-09 NOTE — TOC Initial Note (Signed)
 Transition of Care [facility]) - Initial/Assessment Note    Patient Details  Name: Robert Baldwin MRN: [id number] Date of Birth: 03/09/1941  [facility] CM/SW Contact:    Robert Baldwin Phone Number: [phone] 11/09/2024, 3:20 PM  Clinical Narrative:     HF CSW met with patient at bedside. Patient stated that he lives with spouse and a family friend. Patient stated that he drives. Patient stated that he has no history of HH services. Patient stated that he uses 02, bipap, and has a walker. Patient stated that he has a scale at home. Patient stated that he has a PCP. CSW explained that a hospital follow up appointment is typically scheduled closer towards dc. Patient is agreeable.   HF CSW/CM will continue to follow and monitor for dc readiness.               Expected Discharge Plan: Home/Self Care Barriers to Discharge: Continued Medical Work up   Patient Goals and CMS Choice Patient states their goals for this hospitalization and ongoing recovery are:: return home get healthy CMS Medicare.gov Compare Post Acute Care list provided to:: Patient Choice offered to / list presented to : [name] ownership interest in [facility].provided to:: Patient    Expected Discharge Plan and Services       Living arrangements for the past 2 months: Single Family Home                                      Prior Living Arrangements/Services Living arrangements for the past 2 months: Single Family Home Lives with:: Spouse, Roommate Patient language and need for interpreter reviewed:: Yes Do you feel safe going back to the place where you live?: Yes      Need for Family Participation in Patient Care: No (Comment) Care giver support system in place?: Yes (comment)   Criminal Activity/Legal Involvement Pertinent to Current Situation/Hospitalization: No - Comment as needed  Activities of Daily Living   ADL Screening (condition at time of  admission) Independently performs ADLs?: Yes (appropriate for developmental age) Is the patient deaf or have difficulty hearing?: No Does the patient have difficulty seeing, even when wearing glasses/contacts?: No Does the patient have difficulty concentrating, remembering, or making decisions?: No  Permission Sought/Granted Permission sought to share information with : Family Supports, PCP                Emotional Assessment Appearance:: Appears stated age Attitude/Demeanor/Rapport: Engaged Affect (typically observed): Appropriate Orientation: : Oriented to Self, Oriented to Place, Oriented to  Time, Oriented to Situation Alcohol  / Substance Use: Not Applicable Psych Involvement: No (comment)  Admission diagnosis:  Acute systolic congestive heart failure (HCC) [I50.21] Acute on chronic heart failure with preserved ejection fraction (HFpEF) (HCC) [I50.33] Patient Active Problem List   Diagnosis Date Noted   Acute on chronic heart failure with preserved ejection fraction (HFpEF) (HCC) 11/05/2024   Elevated troponin 11/05/2024   QT prolongation 11/05/2024   Anemia of chronic renal failure 03/30/2024   Multifocal pneumonia 11/27/2023   Acute heart failure with preserved ejection fraction (HFpEF) (HCC) 11/27/2023   Bradycardia 11/25/2023   Acute hypoxemic respiratory failure (HCC) 11/13/2023   Carotid artery stenosis, asymptomatic, right 08/20/2022   Carotid stenosis 06/05/2022   Pulmonary hypertension (HCC) 04/30/2022   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) 04/30/2022   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  Dyspnea on exertion 01/30/2011   DIABETES MELLITUS, TYPE II 04/06/2010   Mixed hyperlipidemia 04/06/2010   Essential hypertension, benign 04/06/2010   Atrial fibrillation with slow ventricular response (HCC) 07/30/2009   PCP:  Robert Isles, MD Pharmacy:   [facility] - [place], [place] - [address] [place] [address] Phone:  [phone] Fax: [phone]  [facility] - [place], [place] - [address] [place] [address] Phone: [phone] Fax: [phone]     Social Drivers of Health (SDOH) Social History: SDOH Screenings   Food Insecurity: No Food Insecurity (11/06/2024)  Housing: Low Risk (11/06/2024)  Transportation Needs: No Transportation Needs (11/06/2024)  Utilities: Not At Risk (11/06/2024)  Depression (PHQ2-9): Low Risk (08/24/2024)  Social Connections: Socially Integrated (11/06/2024)  Tobacco Use: Low Risk (11/05/2024)   SDOH Interventions:     Readmission Risk Interventions    11/27/2023    2:06 PM  Readmission Risk Prevention Plan  Transportation Screening Complete  HRI or Home Care Consult Complete  Social Work Consult for Recovery Care Planning/Counseling Complete  Palliative Care Screening Not Applicable  Medication Review Oceanographer) Complete

## 2024-11-09 NOTE — Progress Notes (Addendum)
 Nurse requested Mobility Specialist to perform oxygen  saturation test with pt which includes removing pt from oxygen  both at rest and while ambulating.  Below are the results from that testing.     Patient Saturations on Room Air at Rest = spO2 93%  Patient Saturations on Room Air while Ambulating = sp02 84% .  Rested and performed pursed lip breathing for 1 minute with sp02 at 88%.  Patient Saturations on 2 Liters of oxygen  while Ambulating = sp02 88%  At end of testing pt left in room on RA.   Reported results to nurse.

## 2024-11-10 ENCOUNTER — Other Ambulatory Visit (HOSPITAL_COMMUNITY): Payer: Self-pay

## 2024-11-10 DIAGNOSIS — I5033 Acute on chronic diastolic (congestive) heart failure: Secondary | ICD-10-CM | POA: Diagnosis not present

## 2024-11-10 LAB — BASIC METABOLIC PANEL WITH GFR
Anion gap: 11 (ref 5–15)
BUN: 83 mg/dL — ABNORMAL HIGH (ref 8–23)
CO2: 33 mmol/L — ABNORMAL HIGH (ref 22–32)
Calcium: 8.5 mg/dL — ABNORMAL LOW (ref 8.9–10.3)
Chloride: 94 mmol/L — ABNORMAL LOW (ref 98–111)
Creatinine, Ser: 2.26 mg/dL — ABNORMAL HIGH (ref 0.61–1.24)
GFR, Estimated: 28 mL/min — ABNORMAL LOW
Glucose, Bld: 101 mg/dL — ABNORMAL HIGH (ref 70–99)
Potassium: 3.7 mmol/L (ref 3.5–5.1)
Sodium: 137 mmol/L (ref 135–145)

## 2024-11-10 LAB — GLUCOSE, CAPILLARY
Glucose-Capillary: 106 mg/dL — ABNORMAL HIGH (ref 70–99)
Glucose-Capillary: 145 mg/dL — ABNORMAL HIGH (ref 70–99)

## 2024-11-10 LAB — CBC
HCT: 30.1 % — ABNORMAL LOW (ref 39.0–52.0)
Hemoglobin: 9.5 g/dL — ABNORMAL LOW (ref 13.0–17.0)
MCH: 26.8 pg (ref 26.0–34.0)
MCHC: 31.6 g/dL (ref 30.0–36.0)
MCV: 85 fL (ref 80.0–100.0)
Platelets: 163 K/uL (ref 150–400)
RBC: 3.54 MIL/uL — ABNORMAL LOW (ref 4.22–5.81)
RDW: 17.1 % — ABNORMAL HIGH (ref 11.5–15.5)
WBC: 7.3 K/uL (ref 4.0–10.5)
nRBC: 0 % (ref 0.0–0.2)

## 2024-11-10 LAB — PROTIME-INR
INR: 2.1 — ABNORMAL HIGH (ref 0.8–1.2)
Prothrombin Time: 25 s — ABNORMAL HIGH (ref 11.4–15.2)

## 2024-11-10 MED ORDER — POTASSIUM CHLORIDE CRYS ER 10 MEQ PO TBCR
10.0000 meq | EXTENDED_RELEASE_TABLET | Freq: Every day | ORAL | 0 refills | Status: DC
  Filled 2024-11-10: qty 60, 60d supply, fill #0

## 2024-11-10 MED ORDER — TORSEMIDE 20 MG PO TABS: 60.0000 mg | ORAL_TABLET | Freq: Every day | ORAL | Status: DC

## 2024-11-10 NOTE — Progress Notes (Addendum)
 "    Advanced Heart Failure Rounding Note  Cardiologist: Jayson Sierras, MD  [facility] Cardiologist: Dr. Rolan Chief Complaint: A/C CHF Patient Profile   Robert Baldwin is a 84 y.o. male with HFpEF, pulmonary hypertension/cor pulmonale, carotid artery disease, permanent atrial fibrillation, sick sinus syndrome, CKD 3B, diabetes admitted on 09/05/2025 for acute on chronic diastolic heart failure and cor pulmonale.   Significant events:   1/16: Echo with EF 65-70%, mildly reduced RV function. Severe TR. Mod pericardial effusion  Subjective:    BP elevated, SBP 130-160s with low diastolic pressure. Weight stable. sCr 2.49>2.26  Sitting up in chair. Feeling well. No shortness of breath or chest pain.   Objective:    Weight Range: 80.2 kg Body mass index is 27.68 kg/m.   Vital Signs:   Temp:  [97.3 F (36.3 C)-98 F (36.7 C)] 98 F (36.7 C) (01/20 0719) Pulse Rate:  [39-44] 42 (01/20 0719) Resp:  [18-20] 18 (01/20 0719) BP: (121-183)/(32-51) 156/36 (01/20 0719) SpO2:  [90 %-99 %] 98 % (01/20 0719) Weight:  [80.1 kg-80.2 kg] 80.2 kg (01/20 0036) Last BM Date : 11/09/24  Weight change: Filed Weights   11/09/24 0217 11/09/24 1006 11/10/24 0036  Weight: 81.6 kg 80.1 kg 80.2 kg   Intake/Output:  Intake/Output Summary (Last 24 hours) at 11/10/2024 0903 Last data filed at 11/10/2024 0600 Gross per 24 hour  Intake 177 ml  Output 1300 ml  Net -1123 ml    Physical Exam   General: Elderly appearing. No distress  Cardiac: JVP flat. No murmurs  Abdomen: Soft, non-distended.  Extremities: Warm and dry.  No edema.  Neuro: A&O x3. Affect pleasant.   Telemetry   Slow AF 30-40s (personally reviewed)  Labs   CBC Recent Labs    11/09/24 0233 11/10/24 0221  WBC 6.5 7.3  HGB 9.4* 9.5*  HCT 29.8* 30.1*  MCV 85.6 85.0  PLT 159 163   Basic Metabolic Panel Recent Labs    98/80/73 0233 11/10/24 0221  NA 137 137  K 3.8 3.7  CL 93* 94*  CO2 34* 33*  GLUCOSE 96 101*  BUN  81* 83*  CREATININE 2.49* 2.26*  CALCIUM  8.4* 8.5*   BNP (last 3 results) Recent Labs    11/15/23 1810 12/18/23 0910 03/12/24 0917  BNP 586.0* 656.3* 642.6*   ProBNP (last 3 results) Recent Labs    10/19/24 1017 10/29/24 1442 11/05/24 1652  PROBNP 3,897.0* 3,452.0* 3,944.0*   Medications:    Scheduled Medications:  atorvastatin   40 mg Oral QHS   dapagliflozin  propanediol  10 mg Oral Daily   hydrALAZINE   50 mg Oral TID   insulin  aspart  0-5 Units Subcutaneous QHS   insulin  aspart  0-9 Units Subcutaneous TID WC   macitentan   10 mg Oral Daily   tadalafil   40 mg Oral Daily   warfarin  7.5 mg Oral Once per day on Monday Friday   And   warfarin  5 mg Oral Once per day on Sunday Tuesday Wednesday Thursday Saturday   Warfarin - Pharmacist Dosing Inpatient   Does not apply q1600    Infusions:   PRN Medications: acetaminophen  **OR** acetaminophen , hydrALAZINE   Assessment/Plan   1. Acute on chronic diastolic heart failure  Pulmonary hypertension/cor pulmonale - Appears euvolemic on exam. 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 - Continue home PAH therapy: macitentan  10 mg, cialis  40 mg   2. Permanent slow atrial fibrillation - V rates  in 30-40s - No AV nodal agents. Continue Coumadin .   - INR 2.1. Discussed warfarin dosing with PharmD.   3. AKI on CKD 3B - up to 2.6 this admission - baseline 1.7-2 - improved 2.49>2.26 today with holding diuresis  4. CAD - Status post bilateral revascularization.  Hold aspirin  since he is on Coumadin . - Continue statin.   5. Elevated troponin, demand - Normal left heart cath in 2023.  Demand related to right heart failure.   6. HTN - Continue hydralazine  50 mg tid  Stable from HF perspective.   Heart failure team will sign off as of 11/10/24 after seen by Dr. Cherrie.  HF Team Medication Recommendations for Home: - lipitor 40 mg daily - farxiga  10 mg daily - hydral  50 mg tid - macitentan  10 mg daily - tadalafil  40 mg daily - warfarin 7.5 mg on Mondays and Fridays and 5 mg all other days - torsemide  60 mg daily - potassium 10 mEq daily  Has follow up schedule in HF Clinic  Length of Stay: 5  Jordan Lee, NP  11/10/2024, 9:03 AM  Advanced Heart Failure Team Pager [phone] (M-F; 7a - 5p)   Please visit [email or web address]: For overnight coverage please call cardiology fellow first. If fellow not available call Shock/ECMO MD on call.  For ECMO / Mechanical Support (Impella, IABP, LVAD) issues call Shock / ECMO MD on call.   Patient seen and examined with the above-signed Advanced Practice Provider and/or Housestaff. I personally reviewed laboratory data, imaging studies and relevant notes. I independently examined the patient and formulated the important aspects of the plan. I have edited the note to reflect any of my changes or salient points. I have personally discussed the plan with the patient and/or family.  He is doing well Volume status looks good. Scr improved.   General:  Sitting up in chair. No resp difficulty HEENT: normal Neck: supple.jvp 7 Cor: Regular rate & rhythm. No rubs, gallops or murmurs. Lungs: clear Abdomen: soft, nontender, nondistended.Good bowel sounds. Extremities: no cyanosis, clubbing, rash, edema Neuro: alert & orientedx3, cranial nerves grossly intact. moves all 4 extremities w/o difficulty. Affect pleasant  He looks good. Ok for d/c today. Will arrange f/u in HF Clinic.   Toribio Cherrie, MD  1:51 PM   "

## 2024-11-10 NOTE — Discharge Instructions (Signed)
 Robert Baldwin

## 2024-11-10 NOTE — Progress Notes (Signed)
 Orthopedic Tech Progress Note Patient Details:  Robert Baldwin [birth year] [id number] We are currently out of [employer]  Patient ID: Robert Baldwin, male   DOB: [birth year], 84 y.o.   MRN: [id number]  Robert Baldwin Pac 11/10/2024, 8:00 AM

## 2024-11-10 NOTE — Care Management Important Message (Signed)
 Important Message  Patient Details  Name: Robert Baldwin MRN: [id number] Date of Birth: 06-Nov-1940   Important Message Given:  Yes - Medicare IM     [name] 11/10/2024, 8:14 AM

## 2024-11-10 NOTE — Discharge Summary (Addendum)
 " Physician Discharge Summary   Patient: Robert Baldwin MRN: [id number] DOB: Nov 13, 1940  Admit date:     11/05/2024  Discharge date: 11/10/24  Discharge Physician: Deliliah Room   PCP: Maree Isles, MD   Recommendations at discharge:    F/u with your PCP in one week F/u with heart failure clinic on the scheduled appointment. F/u with nephrology in 2 weeks.  Discharge Diagnoses: Principal Problem:   Acute on chronic heart failure with preserved ejection fraction (HFpEF) (HCC) Active Problems:   Atrial fibrillation with slow ventricular response (HCC)   Pulmonary hypertension (HCC)   Elevated troponin   QT prolongation    Hospital Course:   84 y.o. 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  He was sent to the ED by cardiology for IV diuresis due to concern for volume overload.   Heart failure team consulted Received IV lasix  in the hospital and dced on oral diuretics. TTE: 65-70%, severe TR  Acute on chronic HFpEF with RV failure, POA: NYHA IV  Appears volume overloaded on exam.  proBNP 3944.   Chest x-ray showing pulmonary vascular congestion without overt edema.  Cardiology consulted .     Continue Farxiga  10 mg daily. Monitor intake and output, daily weights, low-sodium diet with fluid restriction, monitor renal function and electrolytes. TTE done on 1/16 showed EF of 65-70%, RV size is moderately enlarged, moderate pericardial effusion, severe TR Discontinued IV lasix . Discharged on oral torsemide  60 mg daily. Off olmesartan because of renal dysfunction. Not a candidate for beta blockers in the setting of bradycardia Outpt HF clinic  appointment scheduled for 11/18/24.   Acute hypoxic respiratory failure: Secondary to above. Needs 2 L oxygen  on exertion only. TOC consulted for home O2 arrangement.   Elevated troponin History of CAD Troponin 120> 117.   likely due to demand ischemia from decompensated heart failure rather than ACS.  Patient is not endorsing chest pain.  Continue statin. No need for aspirin .   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  Continue macitentan  and tadalafil .     Permanent A-fib with slow ventricular response Heart rate in the 40s and not on any AV nodal blocking agents.  Continue with coumadin    Type 2 diabetes A1c is 6.5%. Continue Farxiga .   Hypertension Continue with home BP meds   CKD stage IIIb Creatinine is 2.2 down from 2.49 yesterday. F/u BMP in one week. Outpatient f/u with nephrology.   Hyperlipidemia Continue Lipitor.   Class I obesity,POA: Likely a candidate for GLP-1 RA as an outpatient.   Chronic anemia Hemoglobin stable.   Disposition: Lives at home with his wife.      Consultants: Cardiology Procedures performed: None  Disposition: Home Diet recommendation:  Cardiac and Carb modified diet DISCHARGE MEDICATION: Allergies as of 11/10/2024       Reactions   Other Rash   Bandaids        Medication List     STOP taking these medications    aspirin  EC  81 MG tablet   Furoscix  80 MG/10ML Ctkt Generic drug: Furosemide        TAKE these medications    atorvastatin  40 MG tablet Commonly known as: LIPITOR Take 40 mg by mouth at bedtime.   cholecalciferol  25 MCG (1000 UNIT) tablet Commonly known as: VITAMIN D3 Take 1,000 Units by mouth in the morning and at bedtime.   Farxiga   10 MG Tabs tablet Generic drug: dapagliflozin  propanediol TAKE 1 TABLET BY MOUTH DAILY   fish oil-omega-3 fatty acids  1000 MG capsule Take 1 g by mouth every evening.   Glucos-Chondroit-MSM Complex Tabs Take 1 tablet by mouth daily.   hydrALAZINE  25 MG tablet Commonly known as: APRESOLINE  Take 2 tablets (50 mg total) by mouth 3 (three) times daily.   multivitamin with minerals tablet Take 1 tablet by mouth in the morning.  Centrum Silver   niacin  500 MG tablet Commonly known as: VITAMIN B3 Take 500 mg by mouth in the morning.   Opsumit  10 MG tablet Generic drug: macitentan  TAKE 1 TABLET BY MOUTH DAILY   potassium chloride  10 MEQ tablet Commonly known as: KLOR-CON  M Take 1 tablet (10 mEq total) by mouth daily.   tadalafil  20 MG tablet Commonly known as: CIALIS  TAKE 2 TABLETS BY MOUTH DAILY   torsemide  20 MG tablet Commonly known as: DEMADEX  Take 3 tablets (60 mg total) by mouth daily.   warfarin 5 MG tablet Commonly known as: COUMADIN  Take 5-7.5 mg by mouth See admin instructions. Take 5 mg by mouth on Saturday, Sunday Tues, Wed, Thursday, and then take 7.5 mg on Monday and Friday               Durable Medical Equipment  (From admission, onward)           Start     Ordered   11/09/24 1226  For home use only DME oxygen  Once       Question Answer Comment  Length of Need 12 Months   Mode or (Route) Nasal cannula   Liters per Minute 2   Frequency Continuous (stationary and portable oxygen unit needed)   Oxygen delivery system: Gas      01 /19/26 1226            Follow-up Information     [facility] Follow up on 11/18/2024.   Specialty: Cardiology Why: Advanced Heart Failure clinic 2 PM Contact information: [address] [place]  [address] [phone]        Maree Isles, MD. Schedule an appointment as soon as possible for a visit on 11/18/2024.   Specialty: Internal  Medicine Why: 10:45 for hospital follow up Contact information: [address] [place] [address] [phone]         [facility], [facility]. Schedule an appointment as soon as possible for a visit in 2 week(s).   Contact information: [address] [place] [address] [phone]                Discharge Exam: Fredricka Weights   11/09/24 0217 11/09/24 1006 11/10/24 0036  Weight: 81.6 kg 80.1 kg 80.2 kg   Constitutional: NAD, calm, comfortable Eyes: PERRL, lids and conjunctivae normal ENMT: Mucous membranes are moist. Posterior pharynx clear of any exudate or lesions.Normal dentition.  Neck: normal, supple, no masses, no thyromegaly Respiratory: clear to auscultation bilaterally, no wheezing, no crackles. Normal respiratory effort. No accessory muscle use.  Cardiovascular: Regular rate and rhythm, no murmurs /  rubs / gallops. No extremity edema. 2+ pedal pulses. No carotid bruits.  Abdomen: no tenderness, no masses palpated. No hepatosplenomegaly. Bowel sounds positive.  Musculoskeletal: no clubbing / cyanosis. No joint deformity upper and lower extremities. Good ROM, no contractures. Normal muscle tone.  Skin: no rashes, lesions, ulcers. No induration Neurologic: CN 2-12 grossly intact. Sensation intact, DTR normal. Strength 5/5 x all 4 extremities.  Psychiatric: Normal judgment and insight. Alert and oriented x 3. Normal mood.    Condition at discharge: good  The results of significant diagnostics from this hospitalization (including imaging, microbiology, ancillary and laboratory) are listed below for reference.   Imaging Studies: ECHOCARDIOGRAM COMPLETE Result Date: 11/06/2024    ECHOCARDIOGRAM REPORT   Patient Name:   TORIN MODICA Date of Exam: 11/06/2024 Medical Rec #:  [id number]   Height:       66.0 in Accession #:    [id number]  Weight:       194.2 lb Date of Birth:  1941/04/01   BSA:          1.975 m Patient Age:    83 years    BP:           161/41 mmHg Patient  Gender: M           HR:           40 bpm. Exam Location:  Inpatient Procedure: 2D Echo, Cardiac Doppler and Color Doppler (Both Spectral and Color            Flow Doppler were utilized during procedure). Indications:    CHF-Acute Diastolic I50.31  History:        Patient has prior history of Echocardiogram examinations, most                 recent 11/19/2023. Arrythmias:Atrial Fibrillation; Risk                 Factors:Diabetes and Hypertension.  Sonographer:    Sydnee Wilson RDCS Referring Phys: [id number] VASUNDHRA RATHORE IMPRESSIONS  1. Left ventricular ejection fraction, by estimation, is 65 to 70%. The left ventricle has normal function. There is moderate asymmetric left ventricular hypertrophy. Left ventricular diastolic parameters are indeterminate.  2. Right ventricular systolic function is mildly reduced. The right ventricular size is moderately enlarged.  3. Left atrial size was mild to moderately dilated.  4. Right atrial size was severely dilated.  5. Pericardial effusion is circumferential and small except for posterior to RA . Moderate pericardial effusion.  6. Trivial mitral valve regurgitation. Moderate mitral annular calcification.  7. Tricuspid valve regurgitation is severe.  8. AV is thickened with minimally restricted motion. Peak and mean gradients through the valve are 23 and 11 mm HG respectively, not signficantly changed from echo in Jan 2025 . The aortic valve is tricuspid. Aortic valve regurgitation is not visualized. Aortic valve sclerosis/calcification is present, without any evidence of aortic stenosis.  9. The inferior vena cava is dilated in size with <50% respiratory variability, suggesting right atrial pressure of 15 mmHg. FINDINGS  Left Ventricle: Left ventricular ejection fraction, by estimation, is 65 to 70%. The left ventricle has normal function. The left ventricular internal cavity size was normal in size. There is moderate asymmetric left ventricular hypertrophy. Left ventricular  diastolic parameters are indeterminate. Right Ventricle: The right ventricular size is moderately enlarged. Right vetricular wall thickness was not assessed. Right ventricular systolic function is mildly reduced. Left Atrium: Left atrial size was mild to moderately dilated. Right Atrium: Right  atrial size was severely dilated. Pericardium: Pericardial effusion is circumferential and small except for posterior to RA. A moderately sized pericardial effusion is present. Mitral Valve: There is mild thickening of the mitral valve leaflet(s). Moderate mitral annular calcification. Trivial mitral valve regurgitation. MV peak gradient, 15.8 mmHg. The mean mitral valve gradient is 5.0 mmHg. Tricuspid Valve: The tricuspid valve is grossly normal. Tricuspid valve regurgitation is severe. Aortic Valve: AV is thickened with minimally restricted motion. Peak and mean gradients through the valve are 23 and 11 mm HG respectively, not signficantly changed from echo in Jan 2025. The aortic valve is tricuspid. Aortic valve regurgitation is not visualized. Aortic valve sclerosis/calcification is present, without any evidence of aortic stenosis. Pulmonic Valve: The pulmonic valve was not well visualized. Pulmonic valve regurgitation is not visualized. Aorta: The aortic root is normal in size and structure. Venous: The inferior vena cava is dilated in size with less than 50% respiratory variability, suggesting right atrial pressure of 15 mmHg. IAS/Shunts: No atrial level shunt detected by color flow Doppler.  MITRAL VALVE MV Peak grad: 15.8 mmHg MV Mean grad: 5.0 mmHg MV Vmax:      1.99 m/s MV Vmean:     98.0 cm/s Vina Gull MD Electronically signed by Vina Gull MD Signature Date/Time: 11/06/2024/11:58:11 AM    Final    DG Chest 2 View Result Date: 11/05/2024 CLINICAL DATA:  Worsening exertional shortness of breath, wheezing, lower extremity edema EXAM: CHEST - 2 VIEW COMPARISON:  11/15/2023 FINDINGS: Frontal and lateral views of the  chest demonstrate persistent enlargement of the cardiac silhouette. There is mild central pulmonary vascular congestion without overt edema. No effusion or pneumothorax. No acute bony abnormalities. IMPRESSION: 1. Stable enlargement of the cardiac silhouette. 2. Pulmonary vascular congestion without overt edema. Electronically Signed   By: Ozell Daring M.D.   On: 11/05/2024 17:23    Microbiology: Results for orders placed or performed during the hospital encounter of 11/13/23  Resp panel by RT-PCR (RSV, Flu A&B, Covid) Anterior Nasal Swab     Status: None   Collection Time: 11/13/23 10:25 AM   Specimen: Anterior Nasal Swab  Result Value Ref Range Status   SARS Coronavirus 2 by RT PCR NEGATIVE NEGATIVE Final    Comment: (NOTE) SARS-CoV-2 target nucleic acids are NOT DETECTED.  The SARS-CoV-2 RNA is generally detectable in upper respiratory specimens during the acute phase of infection. The lowest concentration of SARS-CoV-2 viral copies this assay can detect is 138 copies/mL. A negative result does not preclude SARS-Cov-2 infection and should not be used as the sole basis for treatment or other patient management decisions. A negative result may occur with  improper specimen collection/handling, submission of specimen other than nasopharyngeal swab, presence of viral mutation(s) within the areas targeted by this assay, and inadequate number of viral copies(<138 copies/mL). A negative result must be combined with clinical observations, patient history, and epidemiological information. The expected result is Negative.  Fact Sheet for Patients:  bloggercourse.com  Fact Sheet for Healthcare Providers:  seriousbroker.it  This test is no t yet approved or cleared by the [place]  FDA and  has been authorized for detection and/or diagnosis of SARS-CoV-2 by FDA under an Emergency Use Authorization (EUA). This EUA will remain  in effect  (meaning this test can be used) for the duration of the COVID-19 declaration under Section 564(b)(1) of the Act, 21 U.S.C.section 360bbb-3(b)(1), unless the authorization is terminated  or revoked sooner.       Influenza A  by PCR NEGATIVE NEGATIVE Final   Influenza B by PCR NEGATIVE NEGATIVE Final    Comment: (NOTE) The Xpert Xpress SARS-CoV-2/FLU/RSV plus assay is intended as an aid in the diagnosis of influenza from Nasopharyngeal swab specimens and should not be used as a sole basis for treatment. Nasal washings and aspirates are unacceptable for Xpert Xpress SARS-CoV-2/FLU/RSV testing.  Fact Sheet for Patients: bloggercourse.com  Fact Sheet for Healthcare Providers: seriousbroker.it  This test is not yet approved or cleared by the [place]  FDA and has been authorized for detection and/or diagnosis of SARS-CoV-2 by FDA under an Emergency Use Authorization (EUA). This EUA will remain in effect (meaning this test can be used) for the duration of the COVID-19 declaration under Section 564(b)(1) of the Act, 21 U.S.C. section 360bbb-3(b)(1), unless the authorization is terminated or revoked.     Resp Syncytial Virus by PCR NEGATIVE NEGATIVE Final    Comment: (NOTE) Fact Sheet for Patients: bloggercourse.com  Fact Sheet for Healthcare Providers: seriousbroker.it  This test is not yet approved or cleared by the [place]  FDA and has been authorized for detection and/or diagnosis of SARS-CoV-2 by FDA under an Emergency Use Authorization (EUA). This EUA will remain in effect (meaning this test can be used) for the duration of the COVID-19 declaration under Section 564(b)(1) of the Act, 21 U.S.C. section 360bbb-3(b)(1), unless the authorization is terminated or revoked.  Performed at [facility], [address], [place], [place] [address]   Blood culture (routine x 2)      Status: None   Collection Time: 11/13/23  1:05 PM   Specimen: BLOOD  Result Value Ref Range Status   Specimen Description BLOOD BLOOD LEFT HAND  Final   Special Requests   Final    Blood Culture results may not be optimal due to an inadequate volume of blood received in culture bottles BOTTLES DRAWN AEROBIC AND ANAEROBIC   Culture   Final    NO GROWTH 5 DAYS Performed at [facility], [address], [place], [place] [address]    Report Status 11/18/2023 FINAL  Final  Blood culture (routine x 2)     Status: None   Collection Time: 11/13/23  1:05 PM   Specimen: BLOOD  Result Value Ref Range Status   Specimen Description BLOOD BLOOD RIGHT HAND  Final   Special Requests   Final    Blood Culture results may not be optimal due to an inadequate volume of blood received in culture bottles BOTTLES DRAWN AEROBIC ONLY   Culture   Final    NO GROWTH 5 DAYS Performed at [facility], [address], [place], [place] [address]    Report Status 11/18/2023 FINAL  Final    Labs: CBC: Recent Labs  Lab 11/05/24 1652 11/06/24 0346 11/07/24 0155 11/08/24 0227 11/09/24 0233 11/10/24 0221  WBC 6.7 6.6 6.0 5.8 6.5 7.3  NEUTROABS 4.7  --   --   --   --   --   HGB 10.7* 9.9* 9.1* 8.9* 9.4* 9.5*  HCT 35.6* 32.1* 28.5* 28.1* 29.8* 30.1*  MCV 89.2 87.0 85.1 84.4 85.6 85.0  PLT 167 168 147* 145* 159 163   Basic Metabolic Panel: Recent Labs  Lab 11/06/24 0336 11/06/24 0346 11/07/24 0155 11/08/24 0227 11/09/24 0233 11/10/24 0221  NA  --  140 139 139 137 137  K  --  4.0 4.4 3.8 3.8 3.7  CL  --  100 99 97* 93* 94*  CO2  --  31  31 31 34* 33*  GLUCOSE  --  111* 115* 115* 96 101*  BUN  --  62* 69* 74* 81* 83*  CREATININE  --  1.92* 2.07* 2.29* 2.49* 2.26*  CALCIUM   --  8.7* 8.3* 8.3* 8.4* 8.5*  MG 2.6*  --   --   --   --   --    Liver Function Tests: Recent Labs  Lab 11/06/24 0346  AST 35  ALT 25  ALKPHOS 76  BILITOT 0.6  PROT 6.5  ALBUMIN  3.7   CBG: Recent Labs  Lab  11/09/24 1102 11/09/24 1544 11/09/24 2057 11/10/24 0603 11/10/24 1054  GLUCAP 153* 164* 127* 106* 145*    Discharge time spent: 40 minutes.  Signed: Deliliah Room, MD [facility] 11/10/2024 "

## 2024-11-10 NOTE — Plan of Care (Signed)

## 2024-11-10 NOTE — TOC Transition Note (Addendum)
 Transition of Care [facility]) - Discharge Note   Patient Details  Name: Robert Baldwin MRN: [id number] Date of Birth: [birth year]  Transition [facility]) CM/SW Contact:  Waddell Barnie Rama, RN Phone Number: 11/10/2024, 12:05 PM   Clinical Narrative:    For dc today, he has home oxygen  per patient and daughter.  She state he mother says it has [employer] on the tank,    NCM informed Staff RN we need to get ambulatory oxygen  sats.  Per Pam  Staff RN says , Dr. Bettyjane says patient does not need oxygen ., he has oxygen  at home.   Final next level of care: Other (comment) (To be determined) Barriers to Discharge: Continued Medical Work up   Patient Goals and CMS Choice Patient states their goals for this hospitalization and ongoing recovery are:: return home get healthy CMS Medicare.gov Compare Post Acute Care list provided to:: Patient Choice offered to / list presented to : Patient [place] ownership interest in [facility].provided to:: Patient    Discharge Placement                       Discharge Plan and Services Additional resources added to the After Visit Summary for                                       Social Drivers of Health (SDOH) Interventions SDOH Screenings   Food Insecurity: No Food Insecurity (11/06/2024)  Housing: Low Risk (11/06/2024)  Transportation Needs: No Transportation Needs (11/06/2024)  Utilities: Not At Risk (11/06/2024)  Depression (PHQ2-9): Low Risk (08/24/2024)  Social Connections: Socially Integrated (11/06/2024)  Tobacco Use: Low Risk (11/05/2024)     Readmission Risk Interventions    11/27/2023    2:06 PM  Readmission Risk Prevention Plan  Transportation Screening Complete  HRI or Home Care Consult Complete  Social Work Consult for Recovery Care Planning/Counseling Complete  Palliative Care Screening Not Applicable  Medication Review Oceanographer) Complete

## 2024-11-10 NOTE — Progress Notes (Signed)
 PHARMACY - ANTICOAGULATION CONSULT NOTE  Pharmacy Consult for warfarin Indication: atrial fibrillation  Allergies[1]  Patient Measurements: Height: 5' 7 (170.2 cm) Weight: 80.2 kg (176 lb 11.2 oz) IBW/kg (Calculated) : 66.1 HEPARIN  DW (KG): 80.1  Vital Signs: Temp: 98 F (36.7 C) (01/20 0719) Temp Source: Oral (01/20 0719) BP: 156/36 (01/20 0719) Pulse Rate: 42 (01/20 0719)  Labs: Recent Labs    11/08/24 0227 11/09/24 0233 11/10/24 0221  HGB 8.9* 9.4* 9.5*  HCT 28.1* 29.8* 30.1*  PLT 145* 159 163  LABPROT 25.6* 24.3* 25.0*  INR 2.2* 2.1* 2.1*  CREATININE 2.29* 2.49* 2.26*    Estimated Creatinine Clearance: 25.1 mL/min (A) (by C-G formula based on SCr of 2.26 mg/dL (H)).   Medical History: Past Medical History:  Diagnosis Date   (HFpEF) heart failure with preserved ejection fraction (HCC)    Arthritis    Atrial fibrillation (HCC)    Bradycardia    Carotid artery disease    Hyperlipidemia    Hypertension    Pulmonary hypertension (HCC)    Type 2 diabetes mellitus (HCC)     Medications:  -Warfarin 7.5mg  PO on Monday, Friday and 5mg  PO all other days (verified with wife)  Assessment: 20 yoM presented with shortness of breath. Pharmacy consulted to dose warfarin while inpatient for atrial fibrillation  INR is therapeutic at 2.1. Hgb 9.5, pltc 163- stable. No s/sx of bleeding. Oral intake similar to normal (pt endorses eating everything delivered to him for meals).  Goal of Therapy:  INR 2-3 Monitor platelets by anticoagulation protocol: Yes   Plan:  Continue PTA regimen - Warfarin 5 mg PO tonight x 1  Monitor daily INR, CBC, PO intake and new DDIs  Thank you for allowing pharmacy to participate in this patient's care,  Suzen Sour, PharmD, BCCCP Clinical Pharmacist  Phone: [phone] 11/10/2024 9:20 AM  Please check AMION for all [facility] Pharmacy phone numbers After 10:00 PM, call Main Pharmacy [phone]    [1]  Allergies Allergen Reactions    Other Rash    Bandaids

## 2024-11-10 NOTE — Progress Notes (Signed)
 Mobility Specialist Progress Note:    11/10/24 1031  Mobility  Activity Ambulated with assistance  Level of Assistance Contact guard assist, steadying assist  Assistive Device Other (Comment) (Doesnt want to use AD but wants to use handrails)  Distance Ambulated (ft) 300 ft  Range of Motion/Exercises Active  Activity Response Tolerated fair  Mobility Referral Yes  Mobility visit 1 Mobility  Mobility Specialist Start Time (ACUTE ONLY) 1031  Mobility Specialist Stop Time (ACUTE ONLY) 1045  Mobility Specialist Time Calculation (min) (ACUTE ONLY) 14 min   Received pt sitting in recliner agreeable to session. No c/o any symptoms. Pt moving and ambulating well. Doesn't want to use AD but wants and prefers to use handrails on wall to ambulate. Pt needing 2-3L in order to ambulate and stay above 90%. Returned pt to recliner w/ all needs met.  Venetia Keel Mobility Specialist Please Neurosurgeon or Rehab Office at [phone]

## 2024-11-11 ENCOUNTER — Encounter (HOSPITAL_COMMUNITY)

## 2024-11-16 ENCOUNTER — Encounter (HOSPITAL_COMMUNITY)

## 2024-11-17 ENCOUNTER — Encounter (HOSPITAL_COMMUNITY): Payer: Self-pay | Admitting: *Deleted

## 2024-11-17 DIAGNOSIS — I272 Pulmonary hypertension, unspecified: Secondary | ICD-10-CM

## 2024-11-17 NOTE — Progress Notes (Signed)
 Pulmonary Individual Treatment Plan  Patient Details  Name: Robert Baldwin MRN: [id number] Date of Birth: [birth year] Referring Provider:   Flowsheet Row PULMONARY REHAB OTHER RESP ORIENTATION from 08/24/2024 in [facility] CARDIAC REH[facility]ILITATION  Referring Provider Rolan Barrack MD    Initial Encounter Date:  Flowsheet Row PULMONARY REHAB OTHER RESP ORIENTATION from 08/24/2024 in [place] [facility]  Date 08/24/24    Visit Diagnosis: Pulmonary hypertension (HCC)  Patient's Home Medications on Admission:  Current Medications[1]  Past Medical History: Past Medical History:  Diagnosis Date   (HFpEF) heart failure with preserved ejection fraction (HCC)    Arthritis    Atrial fibrillation (HCC)    Bradycardia    Carotid artery disease    Hyperlipidemia    Hypertension    Pulmonary hypertension (HCC)    Type 2 diabetes mellitus (HCC)     Tobacco Use: Tobacco Use History[2]  Labs: Review Flowsheet  More data exists      Latest Ref Rng & Units 05/21/2023 11/13/2023 11/14/2023 12/18/2023 11/06/2024  Labs for ITP Cardiac and Pulmonary Rehab  Cholestrol 0 - 200 mg/dL - - - 881  -  LDL (calc) 0 - 99 mg/dL - - - 50  -  HDL-C >59 mg/dL - - - 59  -  Trlycerides <150 mg/dL - - - 45  -  Hemoglobin A1c 4.8 - 5.6 % - - 7.4  - 6.5   PH, Arterial 7.35 - 7.45 7.402  7.415  - - - -  PCO2 arterial 32 - 48 mmHg 41.4  40.9  - - - -  Bicarbonate 20.0 - 28.0 mmol/L 25.7  26.2  31.9  - - -  TCO2 22 - 32 mmol/L 27  27  29   - - -  O2 Saturation % 73  75  86.4  - - -    Details       Multiple values from one day are sorted in reverse-chronological order         Capillary Blood Glucose: Lab Results  Component Value Date   GLUCAP 145 (H) 11/10/2024   GLUCAP 106 (H) 11/10/2024   GLUCAP 127 (H) 11/09/2024   GLUCAP 164 (H) 11/09/2024   GLUCAP 153 (H) 11/09/2024     Pulmonary Assessment Scores:  Pulmonary Assessment Scores     Row Name 08/24/24 0843         ADL UCSD    ADL Phase Entry     SOB Score total 17     Rest 2     Walk 2     Stairs 2     Bath 0     Dress 0     Shop 0       CAT Score   CAT Score 11       mMRC Score   mMRC Score 2       UCSD: Self-administered rating of dyspnea associated with activities of daily living (ADLs) 6-point scale (0 = not at all to 5 = maximal or unable to do because of breathlessness)  Scoring Scores range from 0 to 120.  Minimally important difference is 5 units  CAT: CAT can identify the health impairment of COPD patients and is better correlated with disease progression.  CAT has a scoring range of zero to 40. The CAT score is classified into four groups of low (less than 10), medium (10 - 20), high (21-30) and very high (31-40) based on the impact level of disease on health status.  A CAT score over 10 suggests significant symptoms.  A worsening CAT score could be explained by an exacerbation, poor medication adherence, poor inhaler technique, or progression of COPD or comorbid conditions.  CAT MCID is 2 points  mMRC: mMRC (Modified Medical Research Council) Dyspnea Scale is used to assess the degree of baseline functional disability in patients of respiratory disease due to dyspnea. No minimal important difference is established. A decrease in score of 1 point or greater is considered a positive change.   Pulmonary Function Assessment:   Exercise Target Goals: Exercise Program Goal: Individual exercise prescription set using results from initial 6 min walk test and THRR while considering  patients activity barriers and safety.   Exercise Prescription Goal: Initial exercise prescription builds to 30-45 minutes a day of aerobic activity, 2-3 days per week.  Home exercise guidelines will be given to patient during program as part of exercise prescription that the participant will acknowledge.  Activity Barriers & Risk Stratification:  Activity Barriers & Cardiac Risk Stratification - 08/24/24 0853        Activity Barriers & Cardiac Risk Stratification   Activity Barriers Deconditioning;Assistive Device;Shortness of Breath;History of Falls;Balance Concerns;Muscular Weakness          6 Minute Walk:  6 Minute Walk     Row Name 08/24/24 0852         6 Minute Walk   Phase Initial     Distance 710 feet     Walk Time 5.75 minutes     # of Rest Breaks 1  15 sec to tie shoe     MPH 1.4     METS 1.03     RPE 15     Perceived Dyspnea  2     VO2 Peak 3.6     Symptoms Yes (comment)     Comments SOB, fatigue     Resting HR 40 bpm     Resting BP 160/60     Resting Oxygen  Saturation  94 %     Exercise Oxygen  Saturation  during 6 min walk 86 %     Max Ex. HR 68 bpm     Max Ex. BP 170/64     2 Minute Post BP 124/66       Interval HR   1 Minute HR 54     2 Minute HR 54     3 Minute HR 56     4 Minute HR 62     5 Minute HR 58     6 Minute HR 57     2 Minute Post HR 44     Interval Heart Rate? Yes       Interval Oxygen    Interval Oxygen ? Yes     Baseline Oxygen  Saturation % 94 %     1 Minute Oxygen  Saturation % 87 %     1 Minute Liters of Oxygen  0 L  Room Air     2 Minute Oxygen  Saturation % 87 %     2 Minute Liters of Oxygen  0 L     3 Minute Oxygen  Saturation % 86 %     3 Minute Liters of Oxygen  0 L     4 Minute Oxygen  Saturation % 87 %     4 Minute Liters of Oxygen  0 L     5 Minute Oxygen  Saturation % 87 %     5 Minute Liters of Oxygen  0 L     6 Minute Oxygen  Saturation %  87 %     6 Minute Liters of Oxygen  0 L     2 Minute Post Oxygen  Saturation % 90 %     2 Minute Post Liters of Oxygen  0 L        Oxygen  Initial Assessment:  Oxygen  Initial Assessment - 08/24/24 0857       Home Oxygen    Home Oxygen  Device None    Sleep Oxygen  Prescription None    Home Exercise Oxygen  Prescription None    Home Resting Oxygen  Prescription None      Initial 6 min Walk   Oxygen  Used None      Program Oxygen  Prescription   Program Oxygen  Prescription None       Intervention   Short Term Goals To learn and exhibit compliance with exercise, home and travel O2 prescription;To learn and understand importance of monitoring SPO2 with pulse oximeter and demonstrate accurate use of the pulse oximeter.;To learn and understand importance of maintaining oxygen  saturations>88%;To learn and demonstrate proper pursed lip breathing techniques or other breathing techniques.     Long  Term Goals Exhibits compliance with exercise, home  and travel O2 prescription;Maintenance of O2 saturations>88%;Exhibits proper breathing techniques, such as pursed lip breathing or other method taught during program session;Verbalizes importance of monitoring SPO2 with pulse oximeter and return demonstration          Oxygen  Re-Evaluation:   Oxygen  Discharge (Final Oxygen  Re-Evaluation):   Initial Exercise Prescription:  Initial Exercise Prescription - 08/24/24 0800       Date of Initial Exercise RX and Referring Provider   Date 08/24/24    Referring Provider Rolan Barrack MD      Oxygen    Maintain Oxygen  Saturation 88% or higher      NuStep   Level 1    SPM 80    Minutes 15    METs 1.5      Track   Laps 9    Minutes 15    METs 1.5      Prescription Details   Frequency (times per week) 2    Duration Progress to 30 minutes of continuous aerobic without signs/symptoms of physical distress      Intensity   THRR 40-80% of Max Heartrate 79-118    Ratings of Perceived Exertion 11-13    Perceived Dyspnea 0-4      Progression   Progression Continue to progress workloads to maintain intensity without signs/symptoms of physical distress.      Resistance Training   Training Prescription Yes    Weight 3 lb    Reps 10-15          Perform Capillary Blood Glucose checks as needed.  Exercise Prescription Changes:   Exercise Prescription Changes     Row Name 08/24/24 0800 09/14/24 1000           Response to Exercise   Blood Pressure (Admit) 160/60 120/71       Blood Pressure (Exercise) 170/64 132/72      Blood Pressure (Exit) 124/62 122/68      Heart Rate (Admit) 40 bpm 38 bpm      Heart Rate (Exercise) 68 bpm 50 bpm      Heart Rate (Exit) 44 bpm 43 bpm      Oxygen  Saturation (Admit) 94 % 91 %      Oxygen  Saturation (Exercise) 86 % 88 %      Oxygen  Saturation (Exit) 91 % 93 %      Rating of Perceived  Exertion (Exercise) 15 11      Perceived Dyspnea (Exercise) 2 1      Symptoms SOb, fatigue --      Comments walk test results --      Duration -- Continue with 30 min of aerobic exercise without signs/symptoms of physical distress.      Intensity -- THRR unchanged        Progression   Progression -- Continue to progress workloads to maintain intensity without signs/symptoms of physical distress.        Resistance Training   Training Prescription -- Yes      Weight -- 3      Reps -- 10-15        NuStep   Level -- 2      SPM -- 75      Minutes -- 30      METs -- 1.9         Exercise Comments:   Exercise Comments     Row Name 08/31/24 0905           Exercise Comments First full day of exercise!  Patient was oriented to gym and equipment including functions, settings, policies, and procedures.  Patient's individual exercise prescription and treatment plan were reviewed.  All starting workloads were established based on the results of the 6 minute walk test done at initial orientation visit.  The plan for exercise progression was also introduced and progression will be customized based on patient's performance and goals.          Exercise Goals and Review:   Exercise Goals     Row Name 08/24/24 0855             Exercise Goals   Increase Physical Activity Yes       Intervention Provide advice, education, support and counseling about physical activity/exercise needs.;Develop an individualized exercise prescription for aerobic and resistive training based on initial evaluation findings, risk stratification, comorbidities and  participant's personal goals.       Expected Outcomes Short Term: Attend rehab on a regular basis to increase amount of physical activity.;Long Term: Add in home exercise to make exercise part of routine and to increase amount of physical activity.;Long Term: Exercising regularly at least 3-5 days a week.       Increase Strength and Stamina Yes       Intervention Provide advice, education, support and counseling about physical activity/exercise needs.;Develop an individualized exercise prescription for aerobic and resistive training based on initial evaluation findings, risk stratification, comorbidities and participant's personal goals.       Expected Outcomes Short Term: Increase workloads from initial exercise prescription for resistance, speed, and METs.;Short Term: Perform resistance training exercises routinely during rehab and add in resistance training at home;Long Term: Improve cardiorespiratory fitness, muscular endurance and strength as measured by increased METs and functional capacity ( )       Able to understand and use rate of perceived exertion (RPE) scale Yes       Intervention Provide education and explanation on how to use RPE scale       Expected Outcomes Short Term: Able to use RPE daily in rehab to express subjective intensity level;Long Term:  Able to use RPE to guide intensity level when exercising independently       Able to understand and use Dyspnea scale Yes       Intervention Provide education and explanation on how to use Dyspnea scale       Expected  Outcomes Short Term: Able to use Dyspnea scale daily in rehab to express subjective sense of shortness of breath during exertion;Long Term: Able to use Dyspnea scale to guide intensity level when exercising independently       Knowledge and understanding of Target Heart Rate Range (THRR) Yes       Intervention Provide education and explanation of THRR including how the numbers were predicted and where they are located for  reference       Expected Outcomes Short Term: Able to use daily as guideline for intensity in rehab;Long Term: Able to use THRR to govern intensity when exercising independently;Short Term: Able to state/look up THRR       Able to check pulse independently Yes       Intervention Provide education and demonstration on how to check pulse in carotid and radial arteries.;Review the importance of being able to check your own pulse for safety during independent exercise       Expected Outcomes Short Term: Able to explain why pulse checking is important during independent exercise;Long Term: Able to check pulse independently and accurately       Understanding of Exercise Prescription Yes       Intervention Provide education, explanation, and written materials on patient's individual exercise prescription       Expected Outcomes Short Term: Able to explain program exercise prescription;Long Term: Able to explain home exercise prescription to exercise independently          Exercise Goals Re-Evaluation :  Exercise Goals Re-Evaluation     Row Name 08/31/24 0905             Exercise Goal Re-Evaluation   Exercise Goals Review Increase Physical Activity;Increase Strength and Stamina;Able to understand and use rate of perceived exertion (RPE) scale;Able to understand and use Dyspnea scale;Able to check pulse independently;Knowledge and understanding of Target Heart Rate Range (THRR)       Comments Reviewed RPE and dyspnea scale, THR and program prescription with pt today.  Pt voiced understanding and was given a copy of goals to take home.       Expected Outcomes Short: Use RPE daily to regulate intensity.  Long: Follow program prescription in THR.          Discharge Exercise Prescription (Final Exercise Prescription Changes):  Exercise Prescription Changes - 09/14/24 1000       Response to Exercise   Blood Pressure (Admit) 120/71    Blood Pressure (Exercise) 132/72    Blood Pressure (Exit)  122/68    Heart Rate (Admit) 38 bpm    Heart Rate (Exercise) 50 bpm    Heart Rate (Exit) 43 bpm    Oxygen  Saturation (Admit) 91 %    Oxygen  Saturation (Exercise) 88 %    Oxygen  Saturation (Exit) 93 %    Rating of Perceived Exertion (Exercise) 11    Perceived Dyspnea (Exercise) 1    Duration Continue with 30 min of aerobic exercise without signs/symptoms of physical distress.    Intensity THRR unchanged      Progression   Progression Continue to progress workloads to maintain intensity without signs/symptoms of physical distress.      Resistance Training   Training Prescription Yes    Weight 3    Reps 10-15      NuStep   Level 2    SPM 75    Minutes 30    METs 1.9          Nutrition:  Target Goals:  Understanding of nutrition guidelines, daily intake of sodium 1500mg , cholesterol 200mg , calories 30% from fat and 7% or less from saturated fats, daily to have 5 or more servings of fruits and vegetables.  Biometrics:  Pre Biometrics - 08/24/24 0855       Pre Biometrics   Height 5' 6 (1.676 m)    Weight 170 lb 11.2 oz (77.4 kg)    Waist Circumference 39.25 inches    Hip Circumference 40 inches    Waist to Hip Ratio 0.98 %    BMI (Calculated) 27.56    Grip Strength 17.4 kg    Single Leg Stand 2.9 seconds           Nutrition Therapy Plan and Nutrition Goals:  Nutrition Therapy & Goals - 08/24/24 0856       Intervention Plan   Intervention Prescribe, educate and counsel regarding individualized specific dietary modifications aiming towards targeted core components such as weight, hypertension, lipid management, diabetes, heart failure and other comorbidities.;Nutrition handout(s) given to patient.    Expected Outcomes Short Term Goal: Understand basic principles of dietary content, such as calories, fat, sodium, cholesterol and nutrients.;Long Term Goal: Adherence to prescribed nutrition plan.          Nutrition Assessments:  MEDIFICTS Score Key: >=70 Need  to make dietary changes  40-70 Heart Healthy Diet <= 40 Therapeutic Level Cholesterol Diet  Flowsheet Row PULMONARY REHAB OTHER RESP ORIENTATION from 08/24/2024 in [facility] CARDIAC REHABILITATION  Picture Your Plate Total Score on Admission 65   Picture Your Plate Scores: <59 Unhealthy dietary pattern with much room for improvement. 41-50 Dietary pattern unlikely to meet recommendations for good health and room for improvement. 51-60 More healthful dietary pattern, with some room for improvement.  >60 Healthy dietary pattern, although there may be some specific behaviors that could be improved.    Nutrition Goals Re-Evaluation:   Nutrition Goals Discharge (Final Nutrition Goals Re-Evaluation):   Psychosocial: Target Goals: Acknowledge presence or absence of significant depression and/or stress, maximize coping skills, provide positive support system. Participant is able to verbalize types and ability to use techniques and skills needed for reducing stress and depression.  Initial Review & Psychosocial Screening:  Initial Psych Review & Screening - 08/19/24 1022       Family Dynamics   Good Support System? Yes    Comments Patient's wife is his main support.      Barriers   Psychosocial barriers to participate in program There are no identifiable barriers or psychosocial needs.;The patient should benefit from training in stress management and relaxation.      Screening Interventions   Interventions Encouraged to exercise;To provide support and resources with identified psychosocial needs;Provide feedback about the scores to participant    Expected Outcomes Short Term goal: Utilizing psychosocial counselor, staff and physician to assist with identification of specific Stressors or current issues interfering with healing process. Setting desired goal for each stressor or current issue identified.;Long Term Goal: Stressors or current issues are controlled or eliminated.;Short Term  goal: Identification and review with participant of any Quality of Life or Depression concerns found by scoring the questionnaire.;Long Term goal: The participant improves quality of Life and PHQ9 Scores as seen by post scores and/or verbalization of changes          Quality of Life Scores:  Scores of 19 and below usually indicate a poorer quality of life in these areas.  A difference of  2-3 points is a clinically meaningful difference.  A difference of 2-3 points in the total score of the Quality of Life Index has been associated with significant improvement in overall quality of life, self-image, physical symptoms, and general health in studies assessing change in quality of life.   PHQ-9: Review Flowsheet       08/24/2024 04/29/2024 01/23/2024  Depression screen PHQ 2/9  Decreased Interest 0 0 0  Down, Depressed, Hopeless 0 0 0  PHQ - 2 Score 0 0 0  Altered sleeping 0 0 0  Tired, decreased energy 1 1 0  Change in appetite 0 0 0  Feeling bad or failure about yourself  0 0 0  Trouble concentrating 0 0 0  Moving slowly or fidgety/restless 0 0 0  Suicidal thoughts 0 0 0  PHQ-9 Score 1  1  0   Difficult doing work/chores Not difficult at all Not difficult at all -    Details       Data saved with a previous flowsheet row definition        Interpretation of Total Score  Total Score Depression Severity:  1-4 = Minimal depression, 5-9 = Mild depression, 10-14 = Moderate depression, 15-19 = Moderately severe depression, 20-27 = Severe depression   Psychosocial Evaluation and Intervention:   Psychosocial Re-Evaluation:   Psychosocial Discharge (Final Psychosocial Re-Evaluation):    Education: Education Goals: Education classes will be provided on a weekly basis, covering required topics. Participant will state understanding/return demonstration of topics presented.  Learning Barriers/Preferences:  Learning Barriers/Preferences - 08/19/24 1027       Learning  Barriers/Preferences   Learning Barriers Sight    Learning Preferences Pictoral;Written Material          Education Topics: How Lungs Work and Diseases: - Discuss the anatomy of the lungs and diseases that can affect the lungs, such as COPD. Flowsheet Row PULMONARY REHAB OTHER RESPIRATORY from 04/29/2024 in [place] [facility]  Date 03/25/24  Educator hb  Instruction Review Code 1- Verbalizes Understanding    Exercise: -Discuss the importance of exercise, FITT principles of exercise, normal and abnormal responses to exercise, and how to exercise safely.   Environmental Irritants: -Discuss types of environmental irritants and how to limit exposure to environmental irritants.   Meds/Inhalers and oxygen : - Discuss respiratory medications, definition of an inhaler and oxygen , and the proper way to use an inhaler and oxygen .   Energy Saving Techniques: - Discuss methods to conserve energy and decrease shortness of breath when performing activities of daily living.  Flowsheet Row PULMONARY REHAB OTHER RESPIRATORY from 04/29/2024 in [place] [facility]  Date 04/15/24  Educator HB  Instruction Review Code 1- Verbalizes Understanding    Bronchial Hygiene / Breathing Techniques: - Discuss breathing mechanics, pursed-lip breathing technique,  proper posture, effective ways to clear airways, and other functional breathing techniques   Cleaning Equipment: - Provides group verbal and written instruction about the health risks of elevated stress, cause of high stress, and healthy ways to reduce stress.   Nutrition I: Fats: - Discuss the types of cholesterol, what cholesterol does to the body, and how cholesterol levels can be controlled. Flowsheet Row PULMONARY REHAB OTHER RESPIRATORY from 04/29/2024 in [place] [facility]  Date 04/29/24  Educator jh  Instruction Review Code 1- Verbalizes Understanding    Nutrition II: Labels: -Discuss the  different components of food labels and how to read food labels. Flowsheet Row PULMONARY REHAB OTHER RESPIRATORY from 04/29/2024 in [place] [facility]  Date 04/29/24  Educator  jh  Instruction Review Code 1- Verbalizes Understanding    Respiratory Infections: - Discuss the signs and symptoms of respiratory infections, ways to prevent respiratory infections, and the importance of seeking medical treatment when having a respiratory infection.   Stress I: Signs and Symptoms: - Discuss the causes of stress, how stress may lead to anxiety and depression, and ways to limit stress. Flowsheet Row PULMONARY REHAB OTHER RESPIRATORY from 04/29/2024 in [place] [facility]  Date 03/04/24  Educator [facility]  Instruction Review Code 1- Verbalizes Understanding    Stress II: Relaxation: -Discuss relaxation techniques to limit stress. Flowsheet Row PULMONARY REHAB OTHER RESPIRATORY from 04/29/2024 in [place] [facility]  Date 03/04/24  Educator [facility]  Instruction Review Code 1- Verbalizes Understanding    Oxygen  for Home/Travel: - Discuss how to prepare for travel when on oxygen  and proper ways to transport and store oxygen  to ensure safety.   Knowledge Questionnaire Score:  Knowledge Questionnaire Score - 08/24/24 0838       Knowledge Questionnaire Score   Pre Score 12/18          Core Components/Risk Factors/Patient Goals at Admission:  Personal Goals and Risk Factors at Admission - 08/24/24 0856       Core Components/Risk Factors/Patient Goals on Admission    Weight Management Obesity;Weight Maintenance;Yes    Intervention Weight Management: Develop a combined nutrition and exercise program designed to reach desired caloric intake, while maintaining appropriate intake of nutrient and fiber, sodium and fats, and appropriate energy expenditure required for the weight goal.;Weight Management: Provide education and appropriate resources to help participant  work on and attain dietary goals.;Weight Management/Obesity: Establish reasonable short term and long term weight goals.;Obesity: Provide education and appropriate resources to help participant work on and attain dietary goals.    Admit Weight 170 lb 11.2 oz (77.4 kg)    Goal Weight: Short Term 165 lb (74.8 kg)    Goal Weight: Long Term 160 lb (72.6 kg)    Expected Outcomes Short Term: Continue to assess and modify interventions until short term weight is achieved;Long Term: Adherence to nutrition and physical activity/exercise program aimed toward attainment of established weight goal;Weight Maintenance: Understanding of the daily nutrition guidelines, which includes 25-35% calories from fat, 7% or less cal from saturated fats, less than 200mg  cholesterol, less than 1.5gm of sodium, & 5 or more servings of fruits and vegetables daily;Understanding of distribution of calorie intake throughout the day with the consumption of 4-5 meals/snacks;Understanding recommendations for meals to include 15-35% energy as protein, 25-35% energy from fat, 35-60% energy from carbohydrates, less than 200mg  of dietary cholesterol, 20-35 gm of total fiber daily    Improve shortness of breath with ADL's Yes    Intervention Provide education, individualized exercise plan and daily activity instruction to help decrease symptoms of SOB with activities of daily living.    Expected Outcomes Short Term: Improve cardiorespiratory fitness to achieve a reduction of symptoms when performing ADLs;Long Term: Be able to perform more ADLs without symptoms or delay the onset of symptoms    Increase knowledge of respiratory medications and ability to use respiratory devices properly  Yes    Intervention Provide education and demonstration as needed of appropriate use of medications, inhalers, and oxygen  therapy.    Expected Outcomes Short Term: Achieves understanding of medications use. Understands that oxygen  is a medication prescribed by  physician. Demonstrates appropriate use of inhaler and oxygen  therapy.;Long Term: Maintain appropriate use of medications, inhalers, and oxygen  therapy.  Diabetes Yes    Intervention Provide education about proper nutrition, including hydration, and aerobic/resistive exercise prescription along with prescribed medications to achieve blood glucose in normal ranges: Fasting glucose 65-99 mg/dL;Provide education about signs/symptoms and action to take for hypo/hyperglycemia.    Expected Outcomes Short Term: Participant verbalizes understanding of the signs/symptoms and immediate care of hyper/hypoglycemia, proper foot care and importance of medication, aerobic/resistive exercise and nutrition plan for blood glucose control.;Long Term: Attainment of HbA1C < 7%.    Heart Failure Yes    Intervention Provide a combined exercise and nutrition program that is supplemented with education, support and counseling about heart failure. Directed toward relieving symptoms such as shortness of breath, decreased exercise tolerance, and extremity edema.    Expected Outcomes Improve functional capacity of life;Short term: Attendance in program 2-3 days a week with increased exercise capacity. Reported lower sodium intake. Reported increased fruit and vegetable intake. Reports medication compliance.;Short term: Daily weights obtained and reported for increase. Utilizing diuretic protocols set by physician.;Long term: Adoption of self-care skills and reduction of barriers for early signs and symptoms recognition and intervention leading to self-care maintenance.    Hypertension Yes    Intervention Monitor prescription use compliance.;Provide education on lifestyle modifcations including regular physical activity/exercise, weight management, moderate sodium restriction and increased consumption of fresh fruit, vegetables, and low fat dairy, alcohol  moderation, and smoking cessation.    Expected Outcomes Short Term: Continued  assessment and intervention until BP is < 140/70mm HG in hypertensive participants. < 130/67mm HG in hypertensive participants with diabetes, heart failure or chronic kidney disease.;Long Term: Maintenance of blood pressure at goal levels.    Lipids Yes    Intervention Provide education and support for participant on nutrition & aerobic/resistive exercise along with prescribed medications to achieve LDL 70mg , HDL >40mg .    Expected Outcomes Short Term: Participant states understanding of desired cholesterol values and is compliant with medications prescribed. Participant is following exercise prescription and nutrition guidelines.;Long Term: Cholesterol controlled with medications as prescribed, with individualized exercise RX and with personalized nutrition plan. Value goals: LDL < 70mg , HDL > 40 mg.          Core Components/Risk Factors/Patient Goals Review:    Core Components/Risk Factors/Patient Goals at Discharge (Final Review):    ITP Comments:  ITP Comments     Row Name 08/24/24 0849 08/26/24 0810 08/31/24 0905 09/22/24 0929 10/20/24 1300   ITP Comments Patient arrived for 1st visit/orientation/education at 0800. Patient was referred to [facility] by Dr. Ezra Hoffman due to Pulmonary HTN. During orientation advised patient on arrival and appointment times what to wear, what to do before, during and after exercise. Reviewed attendance and class policy.  Pt is scheduled to return Pulmonary Rehab on 08/31/24 at 915. Pt was advised to come to class 15 minutes before class starts.  Discussed RPE/Dpysnea scales. Patient participated in warm up stretches. Patient was able to complete 6 minute walk test. Patient was measured for the equipment. Discussed equipment safety with patient. Took patient pre-anthropometric measurements. Patient finished visit at 845. 30 day review completed. ITP sent to Dr.Jehanzeb Memon, Medical Director of  Pulmonary Rehab. Continue with ITP unless changes are made by  physician. Pt new to the program, has not started yet. First full day of exercise!  Patient was oriented to gym and equipment including functions, settings, policies, and procedures.  Patient's individual exercise prescription and treatment plan were reviewed.  All starting workloads were established based on the results of the 6 minute  walk test done at initial orientation visit.  The plan for exercise progression was also introduced and progression will be customized based on patient's performance and goals. 30 day review completed. ITP sent to Dr.Jehanzeb Memon, Medical Director of  Pulmonary Rehab. Continue with ITP unless changes are made by physician. 30 day review completed. ITP sent to Dr.Jehanzeb Memon, Medical Director of  Pulmonary Rehab. Continue with ITP unless changes are made by physician.  Currently out on medical hold until January.  Last attended on 09/21/24.  Next follow up on 10/29/24    Row Name 11/17/24 1004           ITP Comments 30 day review completed. ITP sent to Dr.Jehanzeb Memon, Medical Director of  Pulmonary Rehab. Continue with ITP unless changes are made by physician.  Patient was readmitted for Heart Failure on 11/05/24 and discharged on 11/10/24.  Follow up scheduled for 11/25/24. No goals assessed this round.          Comments: 30 day review      [1]  Current Outpatient Medications:    atorvastatin  (LIPITOR) 40 MG tablet, Take 40 mg by mouth at bedtime., Disp: , Rfl:    cholecalciferol  (VITAMIN D ) 25 MCG (1000 UNIT) tablet, Take 1,000 Units by mouth in the morning and at bedtime., Disp: , Rfl:    FARXIGA  10 MG TABS tablet, TAKE 1 TABLET BY MOUTH DAILY, Disp: 30 tablet, Rfl: 4   fish oil-omega-3 fatty acids  1000 MG capsule, Take 1 g by mouth every evening., Disp: , Rfl:    hydrALAZINE  (APRESOLINE ) 25 MG tablet, Take 2 tablets (50 mg total) by mouth 3 (three) times daily., Disp: 200 tablet, Rfl: 3   macitentan  (OPSUMIT ) 10 MG tablet, TAKE 1 TABLET BY MOUTH DAILY,  Disp: 90 tablet, Rfl: 3   Misc Natural Products (GLUCOS-CHONDROIT-MSM COMPLEX) TABS, Take 1 tablet by mouth daily., Disp: , Rfl:    Multiple Vitamins-Minerals (MULTIVITAMIN WITH MINERALS) tablet, Take 1 tablet by mouth in the morning.  Centrum Silver, Disp: , Rfl:    niacin  500 MG tablet, Take 500 mg by mouth in the morning., Disp: , Rfl:    potassium chloride  (KLOR-CON  M) 10 MEQ tablet, Take 1 tablet (10 mEq total) by mouth daily., Disp: 60 tablet, Rfl: 0   tadalafil  (CIALIS ) 20 MG tablet, TAKE 2 TABLETS BY MOUTH DAILY, Disp: 180 tablet, Rfl: 3   torsemide  (DEMADEX ) 20 MG tablet, Take 3 tablets (60 mg total) by mouth daily., Disp: , Rfl:    warfarin (COUMADIN ) 5 MG tablet, Take 5-7.5 mg by mouth See admin instructions. Take 5 mg by mouth on Saturday, Sunday Tues, Wed, Thursday, and then take 7.5 mg on Monday and Friday, Disp: , Rfl:  [2]  Social History Tobacco Use  Smoking Status Never   Passive exposure: Never  Smokeless Tobacco Never  Tobacco Comments   Never smoke 05/18/22

## 2024-11-18 ENCOUNTER — Ambulatory Visit (HOSPITAL_COMMUNITY)

## 2024-11-18 ENCOUNTER — Encounter (HOSPITAL_COMMUNITY)

## 2024-11-20 ENCOUNTER — Other Ambulatory Visit (HOSPITAL_COMMUNITY): Payer: Self-pay

## 2024-11-23 ENCOUNTER — Encounter (HOSPITAL_COMMUNITY)

## 2024-11-24 ENCOUNTER — Telehealth (HOSPITAL_COMMUNITY): Payer: Self-pay

## 2024-11-25 ENCOUNTER — Telehealth (HOSPITAL_COMMUNITY): Payer: Self-pay | Admitting: Surgery

## 2024-11-25 ENCOUNTER — Encounter (HOSPITAL_COMMUNITY): Payer: Self-pay

## 2024-11-25 ENCOUNTER — Encounter (HOSPITAL_COMMUNITY)

## 2024-11-25 ENCOUNTER — Ambulatory Visit (HOSPITAL_COMMUNITY)
Admission: RE | Admit: 2024-11-25 | Discharge: 2024-11-25 | Disposition: A | Source: Ambulatory Visit | Attending: Cardiology

## 2024-11-25 VITALS — BP 158/84 | HR 46 | Ht 66.0 in | Wt 173.4 lb

## 2024-11-25 DIAGNOSIS — I350 Nonrheumatic aortic (valve) stenosis: Secondary | ICD-10-CM

## 2024-11-25 DIAGNOSIS — I082 Rheumatic disorders of both aortic and tricuspid valves: Secondary | ICD-10-CM | POA: Insufficient documentation

## 2024-11-25 DIAGNOSIS — I080 Rheumatic disorders of both mitral and aortic valves: Secondary | ICD-10-CM | POA: Insufficient documentation

## 2024-11-25 DIAGNOSIS — I10 Essential (primary) hypertension: Secondary | ICD-10-CM | POA: Diagnosis not present

## 2024-11-25 DIAGNOSIS — I5032 Chronic diastolic (congestive) heart failure: Secondary | ICD-10-CM

## 2024-11-25 DIAGNOSIS — Z8249 Family history of ischemic heart disease and other diseases of the circulatory system: Secondary | ICD-10-CM | POA: Insufficient documentation

## 2024-11-25 DIAGNOSIS — I3139 Other pericardial effusion (noninflammatory): Secondary | ICD-10-CM | POA: Diagnosis not present

## 2024-11-25 DIAGNOSIS — Z7984 Long term (current) use of oral hypoglycemic drugs: Secondary | ICD-10-CM | POA: Insufficient documentation

## 2024-11-25 DIAGNOSIS — I50810 Right heart failure, unspecified: Secondary | ICD-10-CM

## 2024-11-25 DIAGNOSIS — N1832 Chronic kidney disease, stage 3b: Secondary | ICD-10-CM | POA: Insufficient documentation

## 2024-11-25 DIAGNOSIS — I2721 Secondary pulmonary arterial hypertension: Secondary | ICD-10-CM | POA: Diagnosis not present

## 2024-11-25 DIAGNOSIS — Z7901 Long term (current) use of anticoagulants: Secondary | ICD-10-CM | POA: Insufficient documentation

## 2024-11-25 DIAGNOSIS — I6523 Occlusion and stenosis of bilateral carotid arteries: Secondary | ICD-10-CM | POA: Insufficient documentation

## 2024-11-25 DIAGNOSIS — I130 Hypertensive heart and chronic kidney disease with heart failure and stage 1 through stage 4 chronic kidney disease, or unspecified chronic kidney disease: Secondary | ICD-10-CM | POA: Insufficient documentation

## 2024-11-25 DIAGNOSIS — I050 Rheumatic mitral stenosis: Secondary | ICD-10-CM

## 2024-11-25 DIAGNOSIS — I272 Pulmonary hypertension, unspecified: Secondary | ICD-10-CM | POA: Insufficient documentation

## 2024-11-25 DIAGNOSIS — I071 Rheumatic tricuspid insufficiency: Secondary | ICD-10-CM | POA: Diagnosis not present

## 2024-11-25 DIAGNOSIS — Z79899 Other long term (current) drug therapy: Secondary | ICD-10-CM | POA: Insufficient documentation

## 2024-11-25 DIAGNOSIS — I4821 Permanent atrial fibrillation: Secondary | ICD-10-CM | POA: Diagnosis not present

## 2024-11-25 DIAGNOSIS — I5033 Acute on chronic diastolic (congestive) heart failure: Secondary | ICD-10-CM | POA: Insufficient documentation

## 2024-11-25 DIAGNOSIS — E1122 Type 2 diabetes mellitus with diabetic chronic kidney disease: Secondary | ICD-10-CM | POA: Insufficient documentation

## 2024-11-25 DIAGNOSIS — G4733 Obstructive sleep apnea (adult) (pediatric): Secondary | ICD-10-CM | POA: Insufficient documentation

## 2024-11-25 LAB — BASIC METABOLIC PANEL WITH GFR
Anion gap: 11 (ref 5–15)
BUN: 79 mg/dL — ABNORMAL HIGH (ref 8–23)
CO2: 32 mmol/L (ref 22–32)
Calcium: 9.3 mg/dL (ref 8.9–10.3)
Chloride: 99 mmol/L (ref 98–111)
Creatinine, Ser: 2.03 mg/dL — ABNORMAL HIGH (ref 0.61–1.24)
GFR, Estimated: 32 mL/min — ABNORMAL LOW
Glucose, Bld: 108 mg/dL — ABNORMAL HIGH (ref 70–99)
Potassium: 4.2 mmol/L (ref 3.5–5.1)
Sodium: 141 mmol/L (ref 135–145)

## 2024-11-25 LAB — MAGNESIUM: Magnesium: 2.4 mg/dL (ref 1.7–2.4)

## 2024-11-25 LAB — PRO BRAIN NATRIURETIC PEPTIDE: Pro Brain Natriuretic Peptide: 4251 pg/mL — ABNORMAL HIGH

## 2024-11-25 MED ORDER — TORSEMIDE 20 MG PO TABS: 60.0000 mg | ORAL_TABLET | Freq: Two times a day (BID) | ORAL | 5 refills | Status: AC

## 2024-11-25 MED ORDER — METOLAZONE 2.5 MG PO TABS: 2.5000 mg | ORAL_TABLET | ORAL | 3 refills | Status: AC

## 2024-11-25 MED ORDER — POTASSIUM CHLORIDE CRYS ER 10 MEQ PO TBCR: 20.0000 meq | EXTENDED_RELEASE_TABLET | Freq: Every day | ORAL | 0 refills | Status: AC

## 2024-11-25 NOTE — Telephone Encounter (Signed)
 I called and spoke to the pt's wife, since he missed his pulmonary rehab session today.  The pt's wife stated that the pt is supposed to see Dr. Rolan today, which will determine if he goes back into the hospital.  The pt is continuing to have fluid build up, and as a result is very weak.  He was hospitalized a few weeks ago, and has also had to wear a Lasix  pump at home.  The pt's wife stated that he will probably not be back to [facility] until March.  I told her to keep our office updated.

## 2024-11-25 NOTE — Patient Instructions (Signed)
 Medication Changes:  INCREASE TORSEMIDE  TO 60MG  TWICE DAILY   INCREASE POTASSIUM TO 20MEQ ONCE DAILY WITH AN EXTRA ON MONDAY WITH METOLAZONE  DOSE  START METOLAZONE  2.5MG  ONCE WEEKLY   Lab Work:  Labs done today, your results will be available in MyChart, we will contact you for abnormal readings.  Follow-Up in: 1 WEEKS AS SCHEDULED   At [facility], you and your health needs are our priority. We have a designated team specialized in the treatment of Heart Failure. This Care Team includes your primary Heart Failure Specialized Cardiologist (physician), Advanced Practice Providers (APPs- Physician Assistants and Nurse Practitioners), and Pharmacist who all work together to provide you with the care you need, when you need it.   You may see any of the following providers on your designated Care Team at your next follow up:  Dr. Toribio Fuel Dr. Ezra Shuck Dr. Odis Brownie Greig Mosses, NP Caffie Shed, [place] [facility] [place], [place] Beckey Coe, NP Jordan Lee, NP Tinnie Redman, PharmD   Please be sure to bring in all your medications bottles to every appointment.   Need to Contact Us :  If you have any questions or concerns before your next appointment please send us  a message through [place] or call our office at [phone].    TO LEAVE A MESSAGE FOR THE NURSE SELECT OPTION 2, PLEASE LEAVE A MESSAGE INCLUDING: YOUR NAME DATE OF BIRTH CALL BACK NUMBER REASON FOR CALL**this is important as we prioritize the call backs  YOU WILL RECEIVE A CALL BACK THE SAME DAY AS LONG AS YOU CALL BEFORE 4:00 PM

## 2024-11-25 NOTE — Progress Notes (Signed)
 "  [facility]  PCP: Maree Isles, MD Primary Cardiologist: Dr. Debera Nephrologist: Dr. Macel HF Cardiologist: Dr. Rolan  HPI: Robert Baldwin is a 84 y.o. male with history of carotid artery stenosis, DM II, HTN, HLD, permanent atrial fibrillation. He follows with Dr. Debera in the cardiology clinic.    Saw [facility] for carotid artery stenosis in 6/23. Was noting more dyspnea with exertion and lower extremity edema for about a month. He was subsequently started on po lasix  20 mg daily.   He was seen by Dr. Debera in the clinic 04/26/22 for evaluation of recent symptoms and pre-operative evaluation in preparation for left CEA followed by staged right CEA. Echo day of the visit showed EF 70-75%, hyperdynamic LV function, mild LVH, interventricular septum flattened in systole and diastole consistent with RV pressure and volume overload, RV moderately enlarged and moderately reduced, RVSP 120 mmHg, moderate LAE, severe RAE, moderate pericardial effusion, severe TR, dilated IVC. Patient was scheduled for outpatient R/LHC to better assess hemodynamics and further characterize pulmonary hypertension.    [facility] 7/23 showed no significant CAD, RA mean 25, PA 115/30 (58), LVEDP 22 mmHg, PCWP not obtained, Fick CO/CI 6.4/3.1, PAPi 3.4. PVR 5.6 WU. 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 He was diuresed with IV lasix . V/Q scan negative for chronic PE. ANA+. All other serologic test -. HRCT with no definite ILD. Started on tadalafil . Diuresed 24 lbs. Hospitalization complicated by right groin hematoma and received 1 unit PRBCs. Discharged home, weight 179 lbs.  Repeat RHC 8/23 showed elevated PCWP and severe mixed pulmonary arterial/venous hypertension, but PA pressures significantly lower than prior RHC. He was instructed to increase torsemide  to 40 mg daily alternating with 20 mg every other day. Spiro and Entresto  held with  hyperkalemia.  Patient had awake left TCAR by Dr. Gretta in 9/23, no complications.  S/p right TCAR 10/23. Tolerated procedure well.   Echo 7/24 showed EF 60-65%, D-shaped interventricular septum suggestive of RV pressure/volume overload, moderate RV enlargement with mildly decreased systolic function, PASP 78 mmHg, mild mitral stenosis mean gradient 4, mild aortic stenosis mean gradient 15.   Follow up 7/24, creatinine elevated 2.35>>3.32. 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 Torsemide  was increased to 40 mg bid.  Admitted in 1/25 with multifocal PNA and volume overload at [facility].  He was diuresed and was treated with antibiotics.  He was sent home on oxygen  but is no longer using it. Echo in 1/25 showed EF 65-70%, moderate RV enlargement with moderate RV dysfunction, severe biatrial enlargement, mild mitral stenosis with mean gradient 4.7 mmHg, moderate TR, mild aortic stenosis with mean gradient 11 mmHg, PASP 76 mmHg.   ~Weekly AHF visits, ReDS clip 58> 46. Diuretics have been adjusted and has done several rounds of furoscix .   Follow up 11/05/24 with Dr. Zenaida, volume up to abdomin. He was admitted to [facility] for IV diuresis. He was discharged on torsemide  60 mg daily at a weight of 176 lb.   He returns today for HF follow up. Overall feeling okay, does not report shortness of breath but has wheezing. Does have lower extremity swelling. NYHA IIIb. Denies chest pain, palpitations, dizziness, and abnormal bleeding. Able to perform ADLs. Appetite okay. Weight at home  has been unreliable, gifted a scale today. SBP at home 120-130. Compliant with all medications.   6 minute walk (7/24): 259 m 6 minute walk (9/24): 195 m 6 minutes walk (5/25): 243 m 6  minute walk today 08/12/24: 274 m  PMH: 1. HTN 2. Hyperlipidemia 3. Type 2 diabetes 4. CKD stage 3 5. Atrial fibrillation: Permanent.  6. Carotid stenosis: CTA neck (6/23) with high grade stenosis of the bilateral internal carotid arteries.  - S/p left TCAR 9/23.  - S/p right TCAR 10/23. - Carotid doppler (9/24): LICA stent 50-75% stenosis, RICA stent 50-75% stenosis.  7. 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  - R/LHC (7/23): no significant CAD; RA mean 25, PA 115/30 (58), LVEDP 22 mmHg, PCWP not obtained, Fick CO/CI 6.4/3.1, PAPi 3.4. PVR 5.6 WU. - RHC (8/23): Mean RA 8, PA 85/40 mean 41, mean PCWP 22, CI 2.86, PVR 3.4 WU - Echo (7/24): EF 60-65%, D-shaped interventricular septum suggestive of RV pressure/volume overload, moderate RV enlargement with mildly decreased systolic function, PASP 78 mmHg, mild mitral stenosis mean gradient 4, mild aortic stenosis mean gradient 15.  - RHC (7/24): RA mean 9, PA 78/16 (44), PCWP 17 (prominent v-waves to 31), CO/CI (Fick) 7.63/4.09, PVR 3.54 WU - Echo (1/25): EF 65-70%, moderate RV enlargement with moderate RV dysfunction, severe biatrial enlargement, mild mitral stenosis with mean gradient 4.7 mmHg, moderate TR, mild aortic stenosis with mean gradient 11 mmHg, PASP 76 mmHg.  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  See RHC and echo data above.   - V/Q scan (7/23): No evidence for chronic PE.  - High resolution CT chest (7/23): No definite ILD.  - + ANA -  RHC (7/24): RA mean 9, PA 78/16 (44), PCWP 17 (prominent v-waves to 31), CO/CI (Fick) 7.63/4.09, PVR 3.54 WU  Current Outpatient Medications  Medication Sig  Dispense Refill   atorvastatin  (LIPITOR) 40 MG tablet Take 40 mg by mouth at bedtime.     cholecalciferol  (VITAMIN D ) 25 MCG (1000 UNIT) tablet Take 1,000 Units by mouth in the morning and at bedtime.     FARXIGA  10 MG TABS tablet TAKE 1 TABLET BY MOUTH DAILY 30 tablet 4   fish oil-omega-3 fatty acids  1000 MG capsule Take 1 g by mouth every evening.     hydrALAZINE  (APRESOLINE ) 25 MG tablet Take 2 tablets (50 mg total) by mouth 3 (three) times daily. 200 tablet 3   macitentan  (OPSUMIT ) 10 MG tablet TAKE 1 TABLET BY MOUTH DAILY 90 tablet 3   Misc Natural Products (GLUCOS-CHONDROIT-MSM COMPLEX) TABS Take 1 tablet by mouth daily.     Multiple Vitamins-Minerals (MULTIVITAMIN WITH MINERALS) tablet Take 1 tablet by mouth in the morning.  Centrum Silver     niacin  500 MG tablet Take 500 mg by mouth in the morning.     potassium chloride  (KLOR-CON  M) 10 MEQ tablet Take 1 tablet (10 mEq total) by mouth daily. 60 tablet 0   tadalafil  (CIALIS ) 20 MG tablet TAKE 2 TABLETS BY MOUTH DAILY 180 tablet 3   torsemide  (DEMADEX ) 20 MG tablet Take 3 tablets (60 mg total) by mouth daily.     warfarin (COUMADIN ) 5 MG tablet Take 5-7.5 mg by mouth See  admin instructions. Take 5 mg by mouth on Saturday, Sunday Tues, Wed, Thursday, and then take 7.5 mg on Monday and Friday     No current facility-administered medications for this encounter.   Allergies  Allergen Reactions   Other Rash    Bandaids   Social History   Socioeconomic History   Marital status: Married    Spouse name: Tammy   Number of children: Not on file   Years of education: Not on file   Highest education level: Not on file  Occupational History   Occupation: Full time    Comment: Contractor  Tobacco Use   Smoking status: Never    Passive exposure: Never   Smokeless tobacco: Never   Tobacco comments:    Never smoke 05/18/22  Vaping Use   Vaping status: Never Used  Substance and Sexual Activity   Alcohol  use: No    Alcohol /week: 0.0  standard drinks of alcohol    Drug use: No   Sexual activity: Not Currently  Other Topics Concern   Not on file  Social History Narrative   Not on file   Social Drivers of Health   Tobacco Use: Low Risk (11/25/2024)   Patient History    Smoking Tobacco Use: Never    Smokeless Tobacco Use: Never    Passive Exposure: Never  Financial Resource Strain: Not on file  Food Insecurity: No Food Insecurity (11/06/2024)   Epic    Worried About Programme Researcher, Broadcasting/film/video in the Last Year: Never true    [employer] of Food in the Last Year: Never true  Transportation Needs: No Transportation Needs (11/06/2024)   Epic    Lack of Transportation (Medical): No    Lack of Transportation (Non-Medical): No  Physical Activity: Not on file  Stress: Not on file  Social Connections: Socially Integrated (11/06/2024)   Social Connection and Isolation Panel    Frequency of Communication with Friends and Family: More than three times a week    Frequency of Social Gatherings with Friends and Family: More than three times a week    Attends Religious Services: More than 4 times per year    Active Member of [employer] or Organizations: Yes    Attends Banker Meetings: More than 4 times per year    Marital Status: Married  Catering Manager Violence: Not At Risk (11/06/2024)   Epic    Fear of Current or Ex-Partner: No    Emotionally Abused: No    Physically Abused: No    Sexually Abused: No  Depression (PHQ2-9): Low Risk (08/24/2024)   Depression (PHQ2-9)    PHQ-2 Score: 1  Alcohol  Screen: Not on file  Housing: Low Risk (11/06/2024)   Epic    Unable to Pay for Housing in the Last Year: No    Number of Times Moved in the Last Year: 0    Homeless in the Last Year: No  Utilities: Not At Risk (11/06/2024)   Epic    Threatened with loss of utilities: No  Health Literacy: Not on file   Family History  Problem Relation Age of Onset   Stroke Other    Heart disease Other    BP (!) 158/84   Pulse (!) 46   Ht 5'  6 (1.676 m)   Wt 78.7 kg (173 lb 6.4 oz)   SpO2 92%   BMI 27.99 kg/m   Wt Readings from Last 3 Encounters:  11/25/24 78.7 kg (173 lb 6.4 oz)  11/10/24 80.2 kg (  176 lb 11.2 oz)  11/05/24 88.1 kg (194 lb 3.2 oz)   PHYSICAL EXAM: General: Elderly, angelic appearing. No distress  Cardiac: JVP to jaw. Systolic murmur throughout  Resp: audible expiratory wheezing Extremities: Warm and dry.  3+ BLE edema.  Neuro: A&O x3. Affect pleasant.   ReDs reading: 50 %, abnormal  ASSESSMENT & PLAN: 1. 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 RHC/LHC 7/23 showed preserved cardiac output with severe pulmonary hypertension. 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  PA pressure was still high, but is significantly lower than prior RHC.  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 Echo in 1/25 showed EF 65-70%, moderate RV enlargement with moderate RV dysfunction, severe biatrial enlargement, mild mitral stenosis with mean gradient 4.7 mmHg, moderate TR, mild aortic stenosis with mean gradient 11 mmHg, PASP 76 mmHg. Echo 1/26 ef 65-70%, mild RV dysfunction, severe TR, moderate MR, moderate pericardial effusion, AS unchanged.  - NYHA IIIb, functional class confounded by deconditioning. Weight remains unchanged from discharge weight, however appears volume up and ReDs elevated.  - Increase torsemide  to 60 mg bid + KCL 20 mEq daily. BMET, pBNP,  Mg - Start metolazone  2.5 mg weekly (Mondays) + extra KCL 20 mEq.  May eventually need bi-weekly dosing. - GDMT limited by renal function - Continue Farxiga  10 mg daily.  - Off olmesartan with worsening SCr. Continue hydral 50 mg tis - Avoids salt and fluid intake <2L. Encouraged to wear compression socks but does have some difficulty getting them on. Discussed that if he struggles with compression socks, he can wear ACE wraps. Does not qualify for UNNA boots at this time but would be a great candidate for them.   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  Some improvement on 8/23 RHC still with severe mixed PAH/PVH with PVR down to 3.4 WU.  He has never smoked and has no known lung disease.  He has been on warfarin for atrial fibrillation long-term. No known rheumatologic illness.  Not a drinker though he has some signs of cirrhosis by prior liver imaging.  V/Q scan in 7/23 negative for chronic PEs and HRCT chest without definite ILD. Serologic workup showed anti-SCL 70 neg, anti-centromere Ab neg, RF neg, ANA with reflex positive. Suspicion for component of group 1 PH (mixed group 1 and 2).  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   - Diuretics as above. - Continue Opsumit  10 mg daily. - Continue tadalafil  20 mg daily. - Has OSA but unable to tolerate CPAP.   - Once fluid status stable; would like to restart pulm rehab  3. Pericardial effusion - circumferential and moderate on echo 1/26, no evidence of tamponade - repeat echo in 1-2 months  4. Atrial fibrillation: Permanent.   - Bradycardic, does not need nodal blocker. Asymptomatic  5. DM2: Continue SGLT2i  6. Carotid stenosis: CTA neck  in 6/23 showed high grade bilateral ICA stenoses. He has had awake L TCAR 9/23 and R TCAR  10/23.  - He is on warfarin long-term for AF. See above.  - Continue statin, goal LDL < 55. Good lipids in 2/25.   6. HTN: BP elevated, patient states he has white coat syndrome. Home BP labile - Continue hydralazine  as above - Continue to check BP at home.  7. CKD stage IIIb: Last SCr 2.26 on 11/10/24 - Continue SGLT2i. BMET today  8. Aortic stenosis: Mild on 1/25 echo. Unchanged on most recent echo.  9. Mitral stenosis: Mild on 1/25 echo. Mod calcifications on echo 1/26. 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   Follow up in 1 week with APP to assess volume.    Ellanore Vanhook, NP 11/25/2024 "

## 2024-11-26 ENCOUNTER — Ambulatory Visit (HOSPITAL_COMMUNITY): Payer: Self-pay | Admitting: Cardiology

## 2024-11-30 ENCOUNTER — Encounter (HOSPITAL_COMMUNITY)

## 2024-12-02 ENCOUNTER — Encounter (HOSPITAL_COMMUNITY)

## 2024-12-02 ENCOUNTER — Ambulatory Visit (HOSPITAL_COMMUNITY)

## 2024-12-07 ENCOUNTER — Encounter (HOSPITAL_COMMUNITY)

## 2024-12-09 ENCOUNTER — Encounter (HOSPITAL_COMMUNITY)

## 2024-12-14 ENCOUNTER — Encounter (HOSPITAL_COMMUNITY)

## 2024-12-16 ENCOUNTER — Encounter (HOSPITAL_COMMUNITY)

## 2024-12-21 ENCOUNTER — Encounter (HOSPITAL_COMMUNITY)

## 2024-12-23 ENCOUNTER — Encounter (HOSPITAL_COMMUNITY)

## 2024-12-28 ENCOUNTER — Encounter (HOSPITAL_COMMUNITY)
